# Patient Record
Sex: Male | Born: 1955 | Race: White | Hispanic: No | Marital: Single | Smoking: Former smoker
Health system: Southern US, Community
[De-identification: ages and names within clinical notes are randomized; demographics above are authoritative.]

## PROBLEM LIST (undated history)

## (undated) DIAGNOSIS — R519 Headache, unspecified: Secondary | ICD-10-CM

## (undated) DIAGNOSIS — I10 Essential (primary) hypertension: Secondary | ICD-10-CM

## (undated) DIAGNOSIS — R51 Headache: Secondary | ICD-10-CM

## (undated) DIAGNOSIS — F419 Anxiety disorder, unspecified: Secondary | ICD-10-CM

## (undated) DIAGNOSIS — E66812 Obesity, class 2: Secondary | ICD-10-CM

## (undated) DIAGNOSIS — G894 Chronic pain syndrome: Secondary | ICD-10-CM

## (undated) DIAGNOSIS — D126 Benign neoplasm of colon, unspecified: Secondary | ICD-10-CM

## (undated) DIAGNOSIS — S2239XA Fracture of one rib, unspecified side, initial encounter for closed fracture: Secondary | ICD-10-CM

## (undated) DIAGNOSIS — E785 Hyperlipidemia, unspecified: Secondary | ICD-10-CM

## (undated) DIAGNOSIS — Z720 Tobacco use: Secondary | ICD-10-CM

## (undated) DIAGNOSIS — F32A Depression, unspecified: Secondary | ICD-10-CM

## (undated) DIAGNOSIS — E669 Obesity, unspecified: Secondary | ICD-10-CM

## (undated) DIAGNOSIS — M1990 Unspecified osteoarthritis, unspecified site: Secondary | ICD-10-CM

## (undated) DIAGNOSIS — Z9861 Coronary angioplasty status: Secondary | ICD-10-CM

## (undated) DIAGNOSIS — K219 Gastro-esophageal reflux disease without esophagitis: Secondary | ICD-10-CM

## (undated) DIAGNOSIS — G709 Myoneural disorder, unspecified: Secondary | ICD-10-CM

## (undated) DIAGNOSIS — J189 Pneumonia, unspecified organism: Secondary | ICD-10-CM

## (undated) DIAGNOSIS — Z8669 Personal history of other diseases of the nervous system and sense organs: Secondary | ICD-10-CM

## (undated) DIAGNOSIS — J449 Chronic obstructive pulmonary disease, unspecified: Secondary | ICD-10-CM

## (undated) DIAGNOSIS — I2510 Atherosclerotic heart disease of native coronary artery without angina pectoris: Secondary | ICD-10-CM

## (undated) DIAGNOSIS — C3490 Malignant neoplasm of unspecified part of unspecified bronchus or lung: Secondary | ICD-10-CM

## (undated) DIAGNOSIS — R0600 Dyspnea, unspecified: Secondary | ICD-10-CM

## (undated) DIAGNOSIS — M797 Fibromyalgia: Secondary | ICD-10-CM

## (undated) DIAGNOSIS — F329 Major depressive disorder, single episode, unspecified: Secondary | ICD-10-CM

## (undated) HISTORY — PX: CARDIAC CATHETERIZATION: SHX172

## (undated) HISTORY — PX: APPENDECTOMY: SHX54

## (undated) HISTORY — PX: INGUINAL HERNIA REPAIR: SUR1180

## (undated) HISTORY — DX: Coronary angioplasty status: Z98.61

## (undated) HISTORY — DX: Fracture of one rib, unspecified side, initial encounter for closed fracture: S22.39XA

## (undated) HISTORY — DX: Major depressive disorder, single episode, unspecified: F32.9

## (undated) HISTORY — DX: Personal history of other diseases of the nervous system and sense organs: Z86.69

## (undated) HISTORY — DX: Fibromyalgia: M79.7

## (undated) HISTORY — DX: Chronic pain syndrome: G89.4

## (undated) HISTORY — DX: Obesity, unspecified: E66.9

## (undated) HISTORY — DX: Malignant neoplasm of unspecified part of unspecified bronchus or lung: C34.90

## (undated) HISTORY — DX: Obesity, class 2: E66.812

## (undated) HISTORY — PX: NM MYOVIEW LTD: HXRAD82

## (undated) HISTORY — DX: Hyperlipidemia, unspecified: E78.5

## (undated) HISTORY — PX: CORONARY ANGIOPLASTY WITH STENT PLACEMENT: SHX49

## (undated) HISTORY — DX: Essential (primary) hypertension: I10

## (undated) HISTORY — PX: MOLE REMOVAL: SHX2046

## (undated) HISTORY — PX: COLONOSCOPY: SHX174

## (undated) HISTORY — DX: Depression, unspecified: F32.A

## (undated) HISTORY — PX: HERNIA REPAIR: SHX51

## (undated) HISTORY — DX: Tobacco use: Z72.0

## (undated) HISTORY — DX: Atherosclerotic heart disease of native coronary artery without angina pectoris: I25.10

## (undated) HISTORY — DX: Benign neoplasm of colon, unspecified: D12.6

## (undated) HISTORY — DX: Anxiety disorder, unspecified: F41.9

---

## 1977-03-07 HISTORY — PX: EXPLORATORY LAPAROTOMY: SUR591

## 1997-12-25 ENCOUNTER — Encounter: Payer: Self-pay | Admitting: Endocrinology

## 1997-12-25 ENCOUNTER — Ambulatory Visit (HOSPITAL_COMMUNITY): Admission: RE | Admit: 1997-12-25 | Discharge: 1997-12-25 | Payer: Self-pay | Admitting: Endocrinology

## 1998-01-05 ENCOUNTER — Ambulatory Visit (HOSPITAL_COMMUNITY): Admission: RE | Admit: 1998-01-05 | Discharge: 1998-01-05 | Payer: Self-pay | Admitting: Otolaryngology

## 1998-02-27 ENCOUNTER — Ambulatory Visit (HOSPITAL_COMMUNITY): Admission: RE | Admit: 1998-02-27 | Discharge: 1998-02-27 | Payer: Self-pay | Admitting: *Deleted

## 2000-06-05 ENCOUNTER — Encounter: Admission: RE | Admit: 2000-06-05 | Discharge: 2000-07-11 | Payer: Self-pay

## 2000-06-06 ENCOUNTER — Encounter: Admission: RE | Admit: 2000-06-06 | Discharge: 2000-06-06 | Payer: Self-pay

## 2001-03-13 ENCOUNTER — Encounter: Admission: RE | Admit: 2001-03-13 | Discharge: 2001-03-13 | Payer: Self-pay | Admitting: Endocrinology

## 2001-03-13 ENCOUNTER — Encounter: Payer: Self-pay | Admitting: Endocrinology

## 2003-06-03 ENCOUNTER — Encounter: Admission: RE | Admit: 2003-06-03 | Discharge: 2003-06-03 | Payer: Self-pay | Admitting: Endocrinology

## 2003-06-03 IMAGING — CT CT HEAD W/O CM
1 series · 15 of 28 positions shown, 19 images · non-contrast
Comparison: none

CLINICAL DATA: Headaches. 
 CT HEAD SCAN WITHOUT CONTRAST MEDIA 
 Transaxial cuts were obtained from the skull base to the vertex.  There is mucosal thickening involving nasal and ethmoidal complexes and anterior aspect of the sphenoid sinus.  Hypoplastic frontal sinuses.  Somewhat of a convex soft tissue nodular-like appearance raising the question of sinonasal polyposis.  Probable mucosal thickening of portions of nasal cavity mainly anteriorly.  Attenuation of some of the ethmoid sinus septae.  Some of the septae may be absent as well.  Also, possible destruction or previous resection of nasal turbinates.  Does the patient have a history of chronic sinonasal inflammatory disease such as sinonasal polyposis or other chronic inflammatory process?  Also, is there a history of previous sinonasal surgery?  No acute intracranial findings.  No mass or hydrocephalus.  Mild generalized atrophic changes of the brain.  
 IMPRESSION
 On this noncontrast CT study, there is no evidence of hydrocephalus or mass effect.  See comments concerning sinonasal findings, more likely inflammatory in nature.  Also, query post-surgical changes.  If further imaging workup is desired, consider MRI of the brain and paranasal sinuses with and without IV contrast.

[Series 2: brain · axial · 0.49mm/px · z∈[+42,+174]mm · 15 of 28 slices shown, 19 images]
[im 2/28  brain]
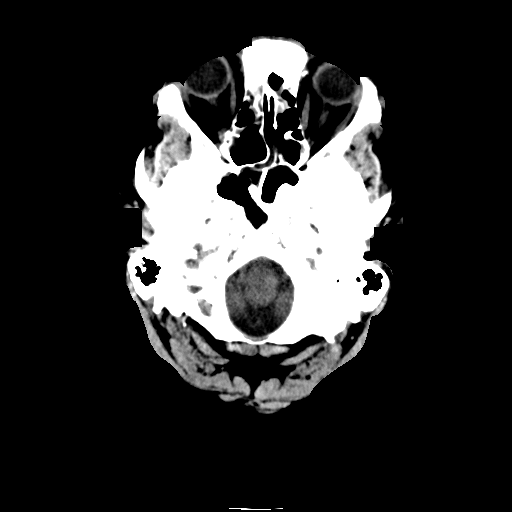
[im 2/28  bone]
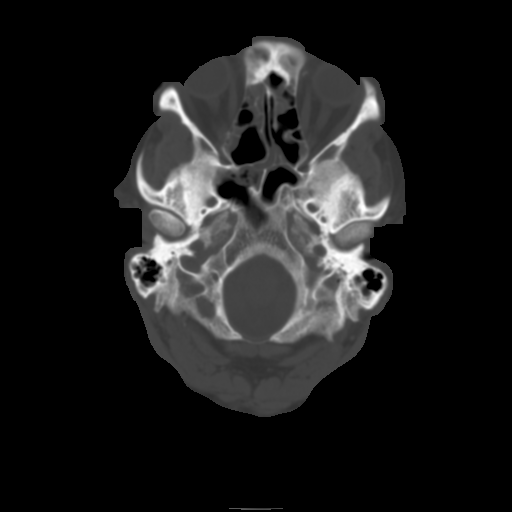
[im 4/28  brain]
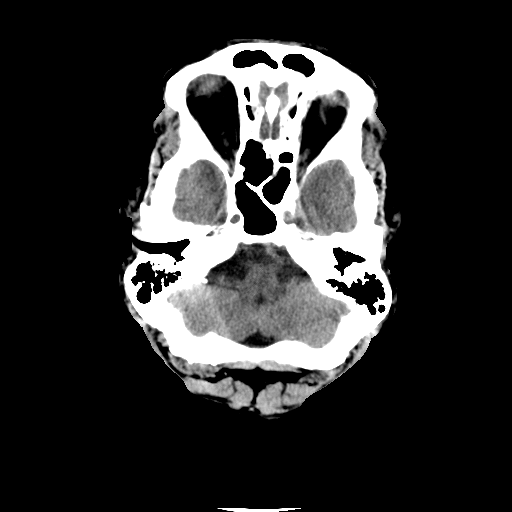
[im 6/28  brain]
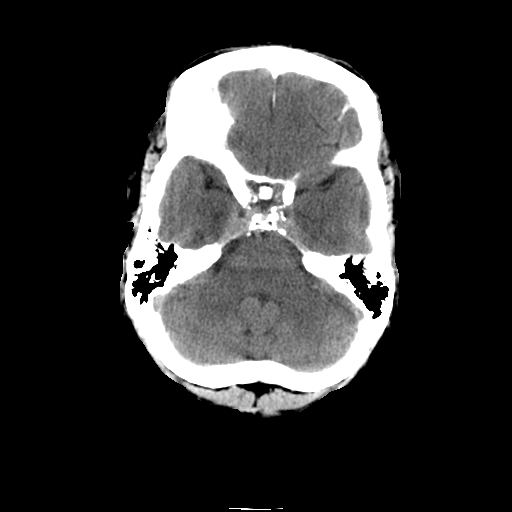
[im 8/28  brain]
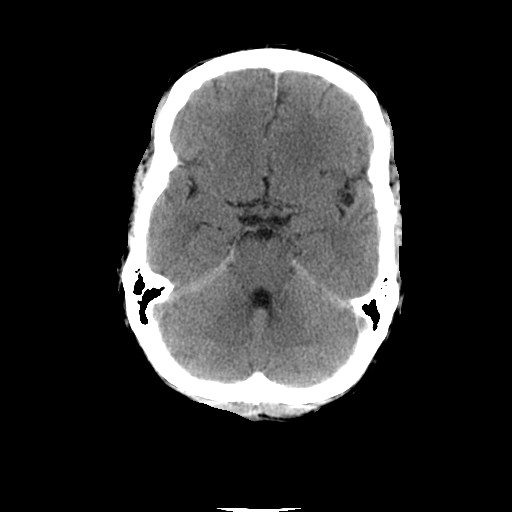
[im 9/28  brain]
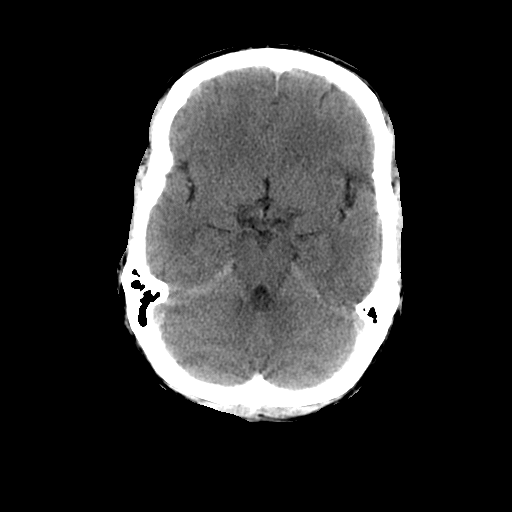
[im 9/28  bone]
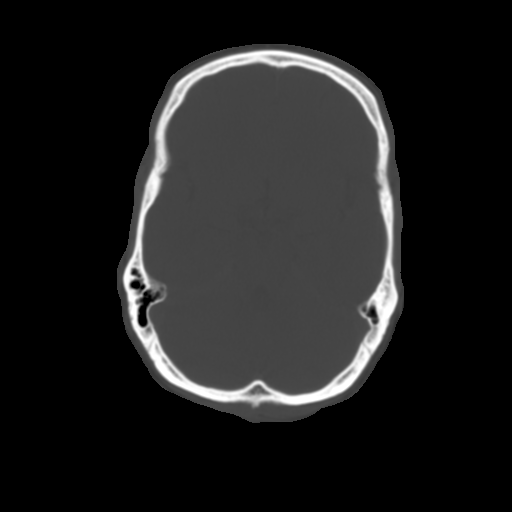
[im 11/28  brain]
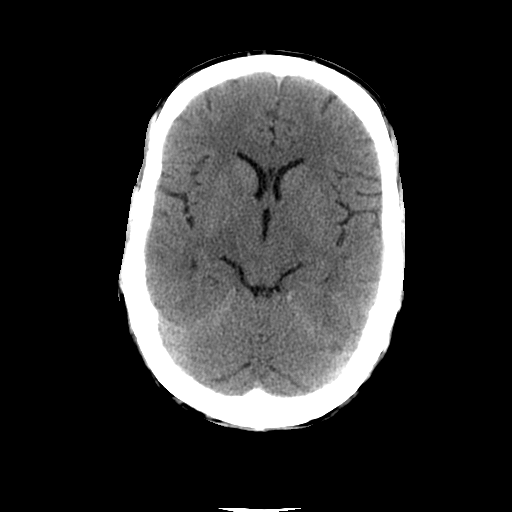
[im 13/28  brain]
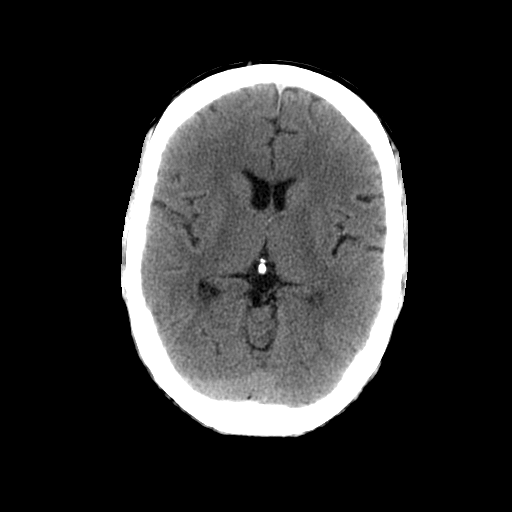
[im 15/28  brain]
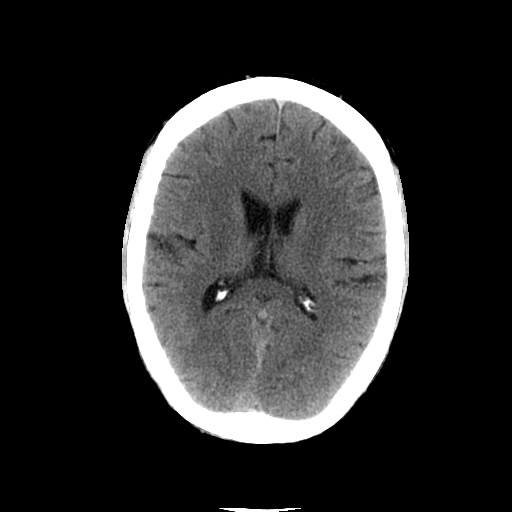
[im 16/28  brain]
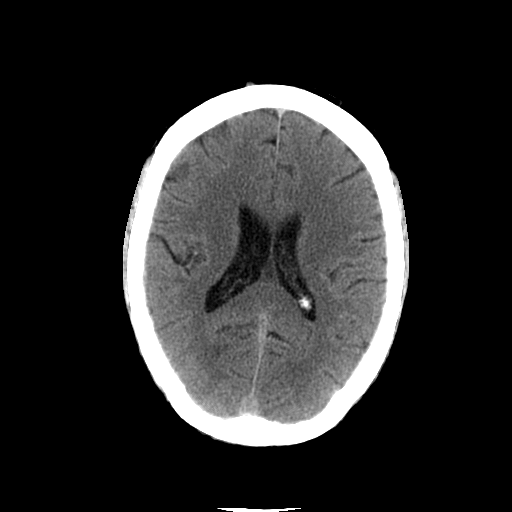
[im 16/28  bone]
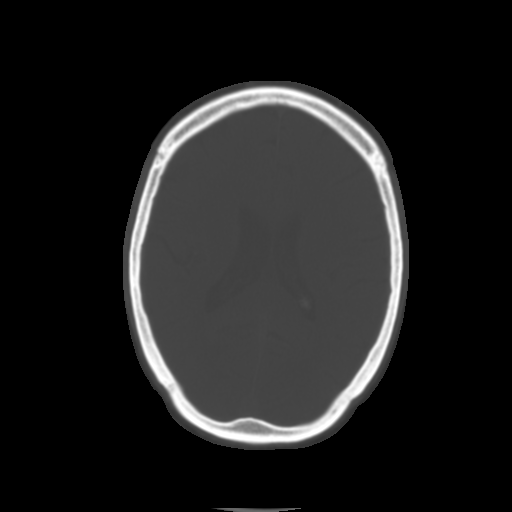
[im 18/28  brain]
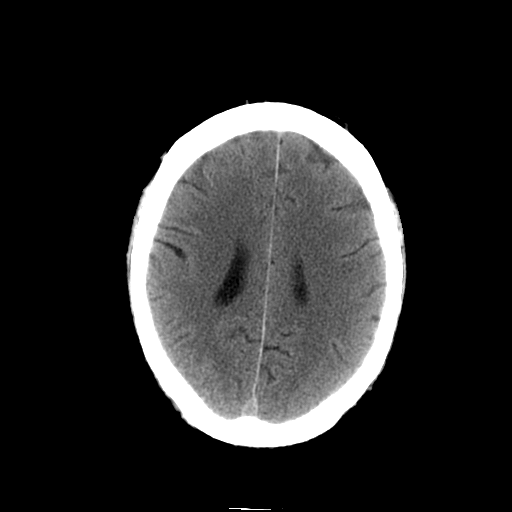
[im 20/28  brain]
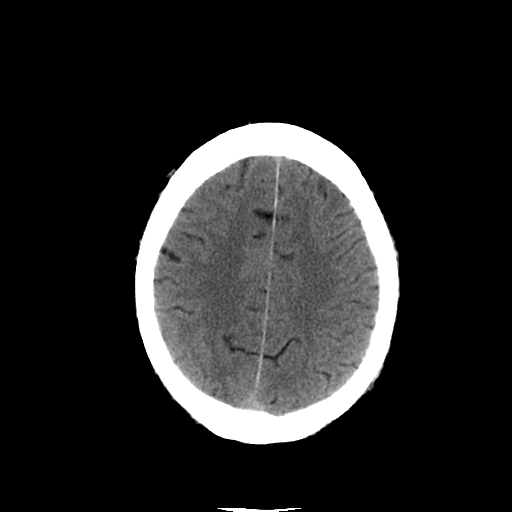
[im 21/28  brain]
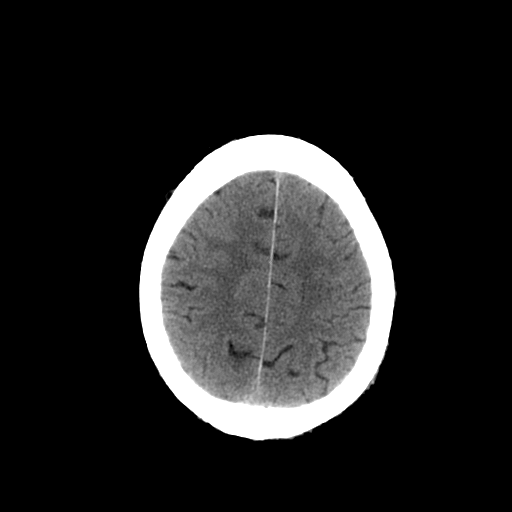
[im 23/28  brain]
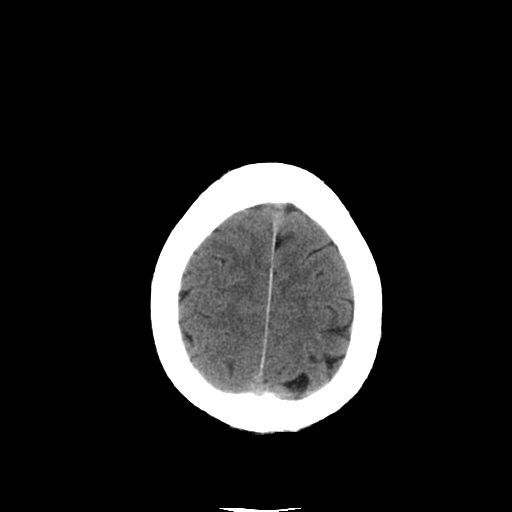
[im 23/28  bone]
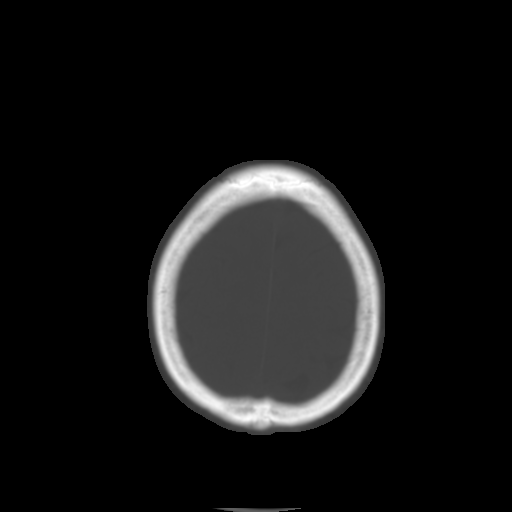
[im 25/28  brain]
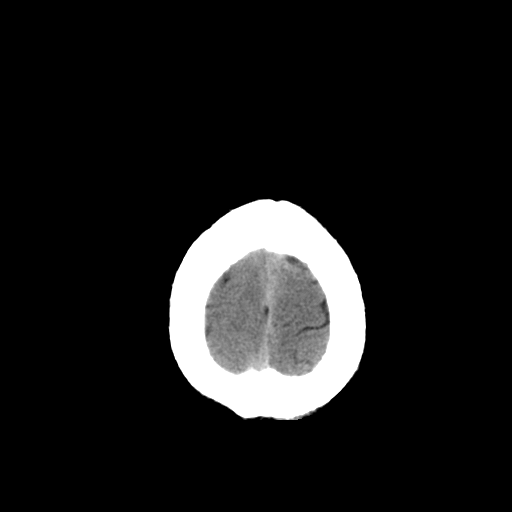
[im 27/28  brain]
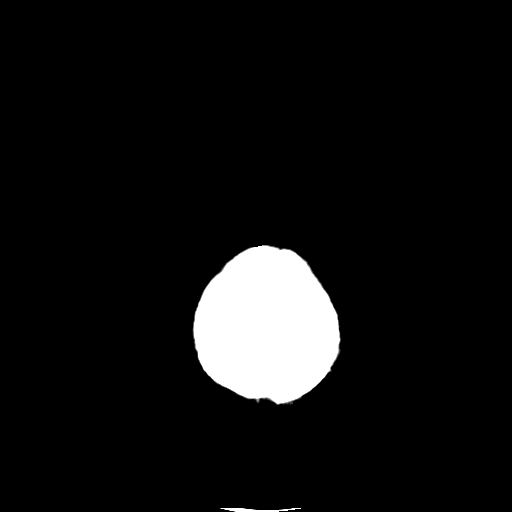

[15 of 28 positions shown; findings below may reference images not displayed]

## 2004-03-07 DIAGNOSIS — Z9861 Coronary angioplasty status: Secondary | ICD-10-CM

## 2004-03-07 DIAGNOSIS — I2510 Atherosclerotic heart disease of native coronary artery without angina pectoris: Secondary | ICD-10-CM

## 2004-03-07 HISTORY — DX: Atherosclerotic heart disease of native coronary artery without angina pectoris: I25.10

## 2004-03-07 HISTORY — DX: Coronary angioplasty status: Z98.61

## 2004-03-30 ENCOUNTER — Inpatient Hospital Stay (HOSPITAL_COMMUNITY): Admission: EM | Admit: 2004-03-30 | Discharge: 2004-04-03 | Payer: Self-pay | Admitting: *Deleted

## 2004-03-30 IMAGING — CR DG CHEST 1V PORT
1 series · 1 of 1 positions shown · non-contrast
Comparison: None.

CLINICAL DATA: Chest pain and shortness of breath.

[view not recorded]
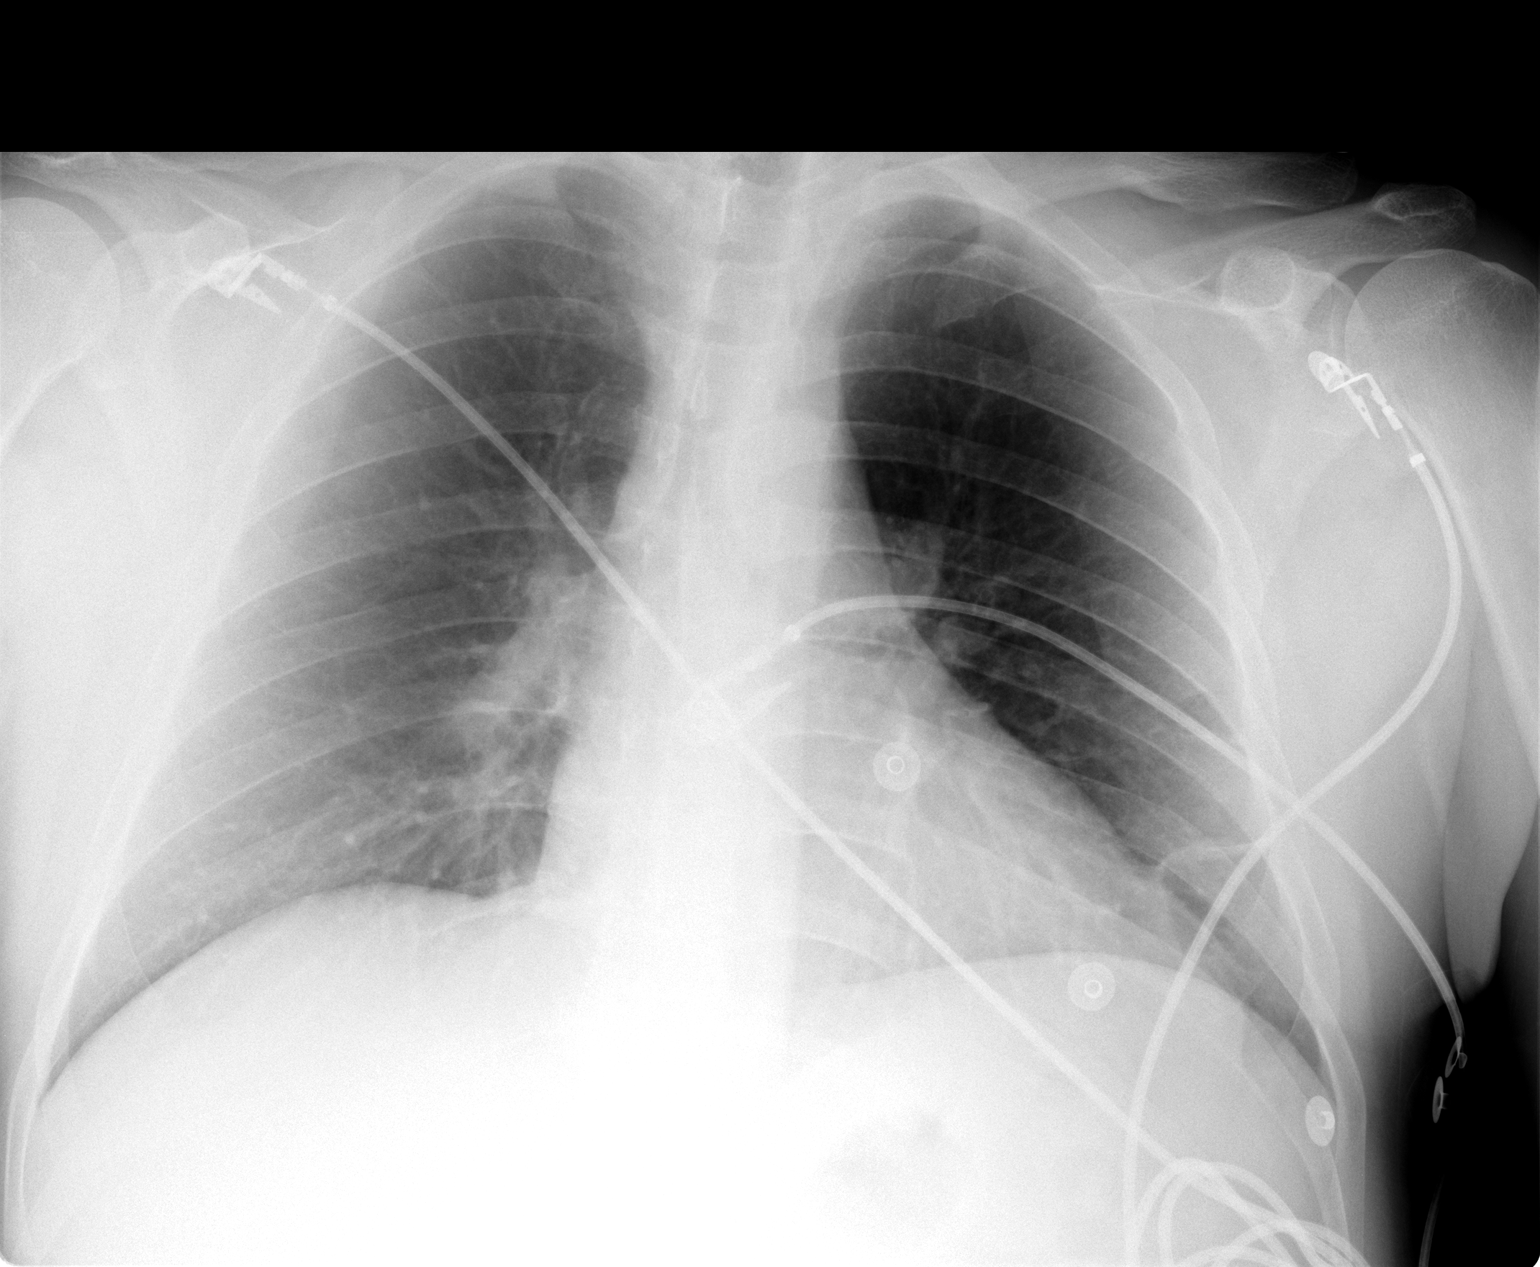

[1 of 1 positions shown; findings below may reference images not displayed]

CHEST PORTABLE ONE VIEW:
 Lungs are clear.  Heart size is normal.  No focal bony abnormality.
IMPRESSION: No acute disease.

## 2004-04-19 ENCOUNTER — Encounter (HOSPITAL_COMMUNITY): Admission: RE | Admit: 2004-04-19 | Discharge: 2004-07-18 | Payer: Self-pay | Admitting: Cardiology

## 2004-05-05 ENCOUNTER — Ambulatory Visit (HOSPITAL_COMMUNITY): Admission: AD | Admit: 2004-05-05 | Discharge: 2004-05-06 | Payer: Self-pay | Admitting: Cardiology

## 2004-05-05 IMAGING — CR DG CHEST 2V
2 series · 2 of 2 positions shown · non-contrast
Comparison: none

CLINICAL DATA: Chest pain

Chest 2 view:
Comparison [DATE]. There is a new patchy lingular infiltrate. Heart size
remains normal. No effusion. Visualized bones unremarkable.

[view not recorded (1 of 2)]
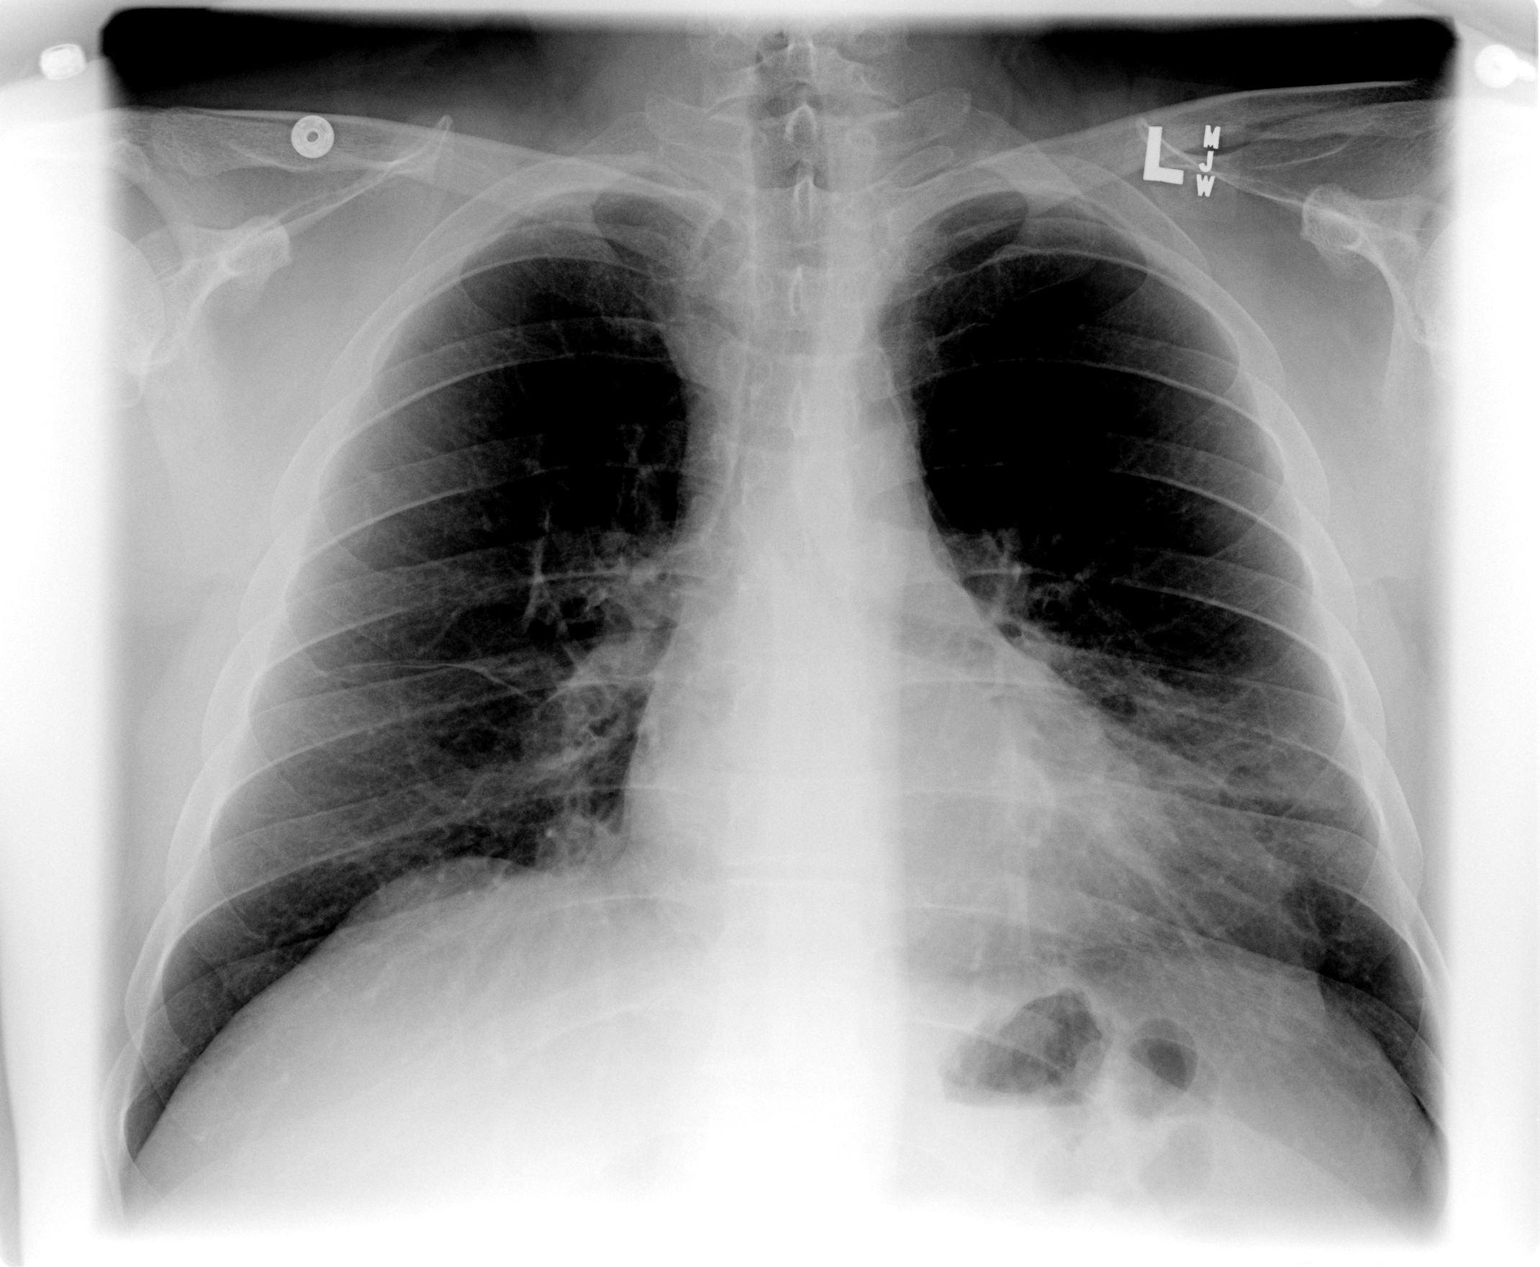

[view not recorded (2 of 2)]
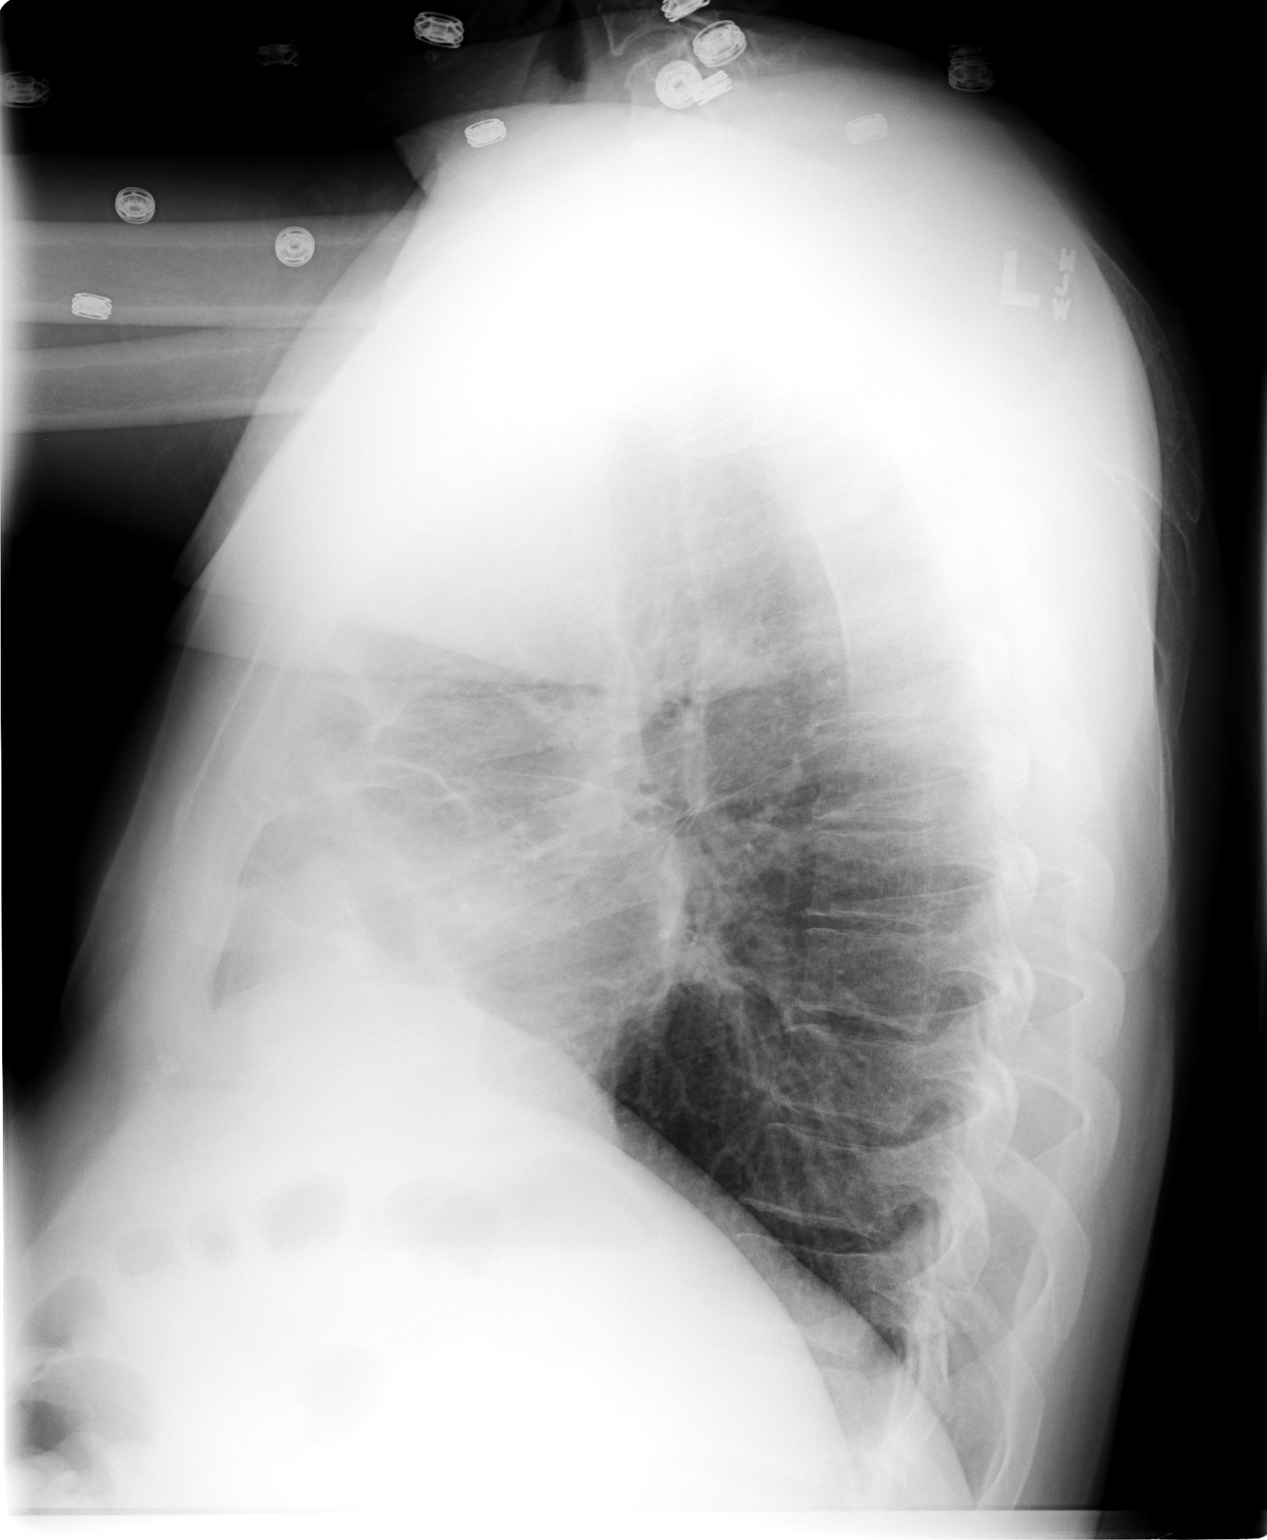

[2 of 2 positions shown; findings below may reference images not displayed]

IMPRESSION: 1. Patchy lingular infiltrate, new since previous exam. Recommend followup to
confirm appropriate resolution.

## 2004-05-06 IMAGING — CR DG CHEST 2V
2 series · 2 of 2 positions shown · non-contrast
Comparison: [DATE].

CLINICAL DATA: Follow-up lingular infiltrate.  Chest pain.
 CHEST - 2 VIEW:

[view not recorded (1 of 2)]
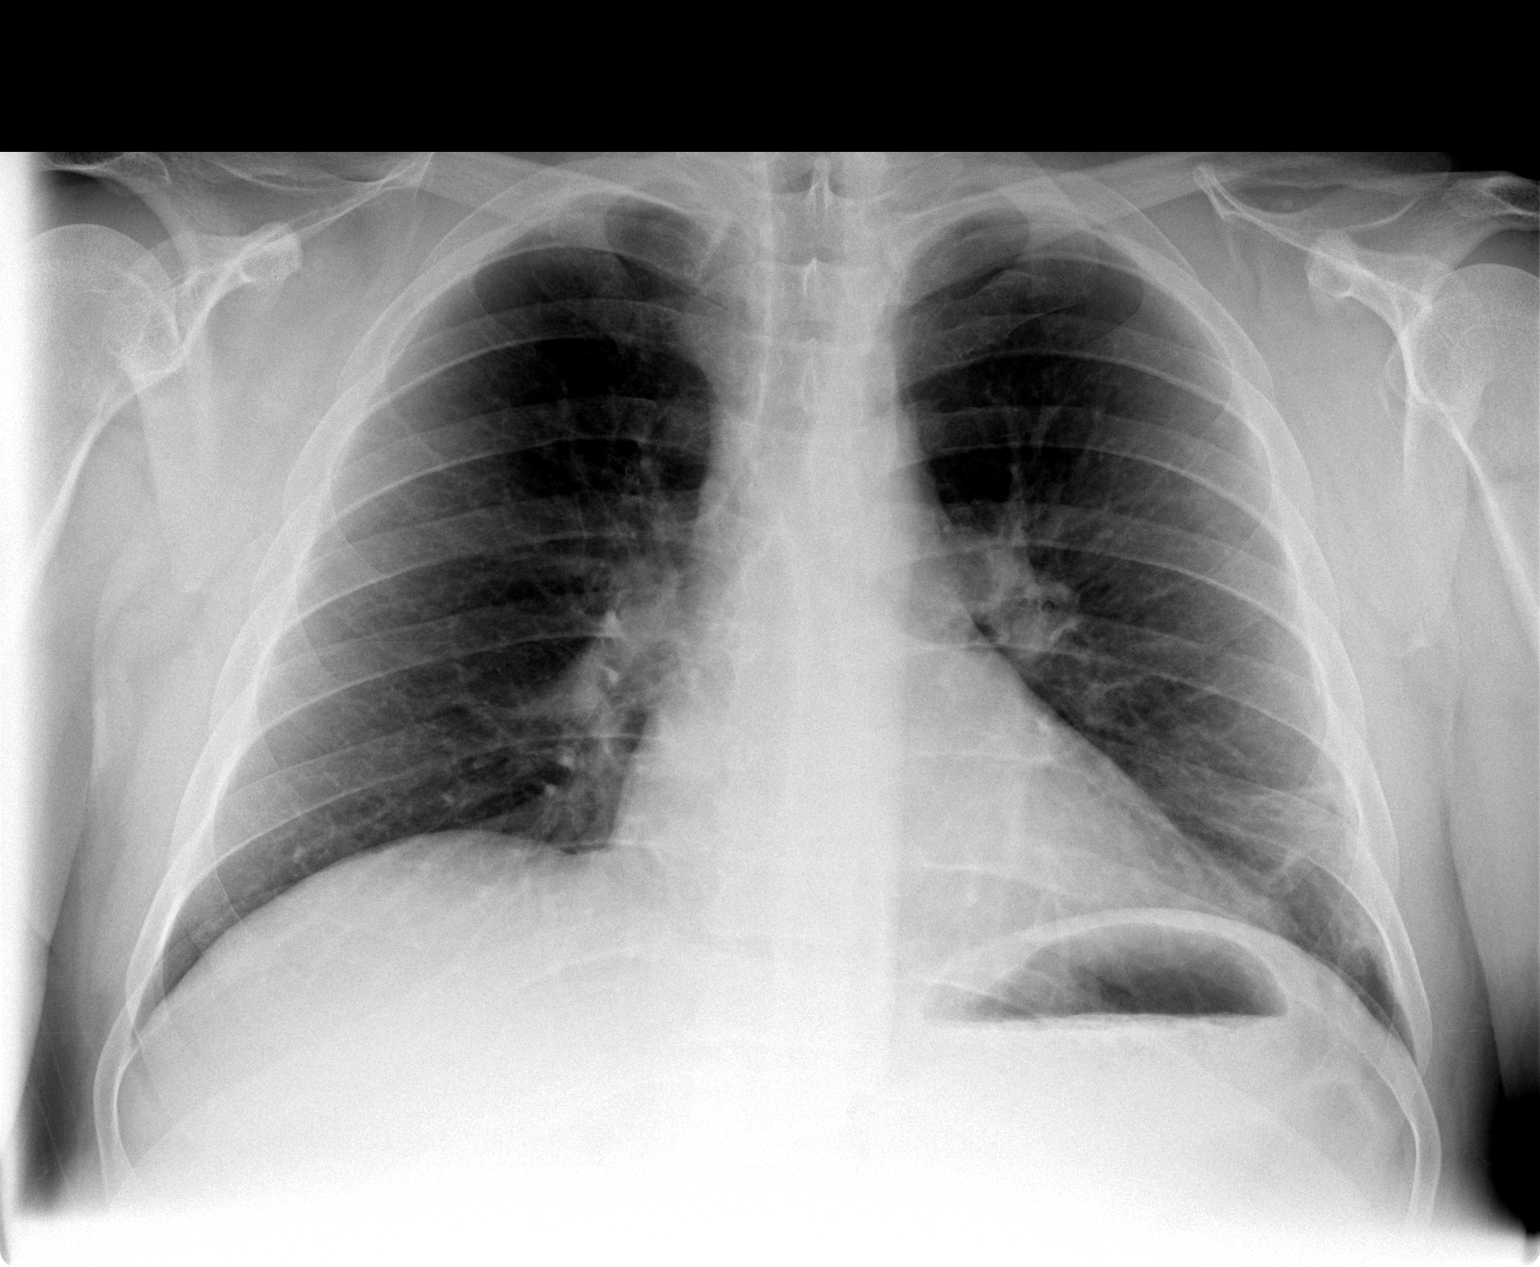

[view not recorded (2 of 2)]
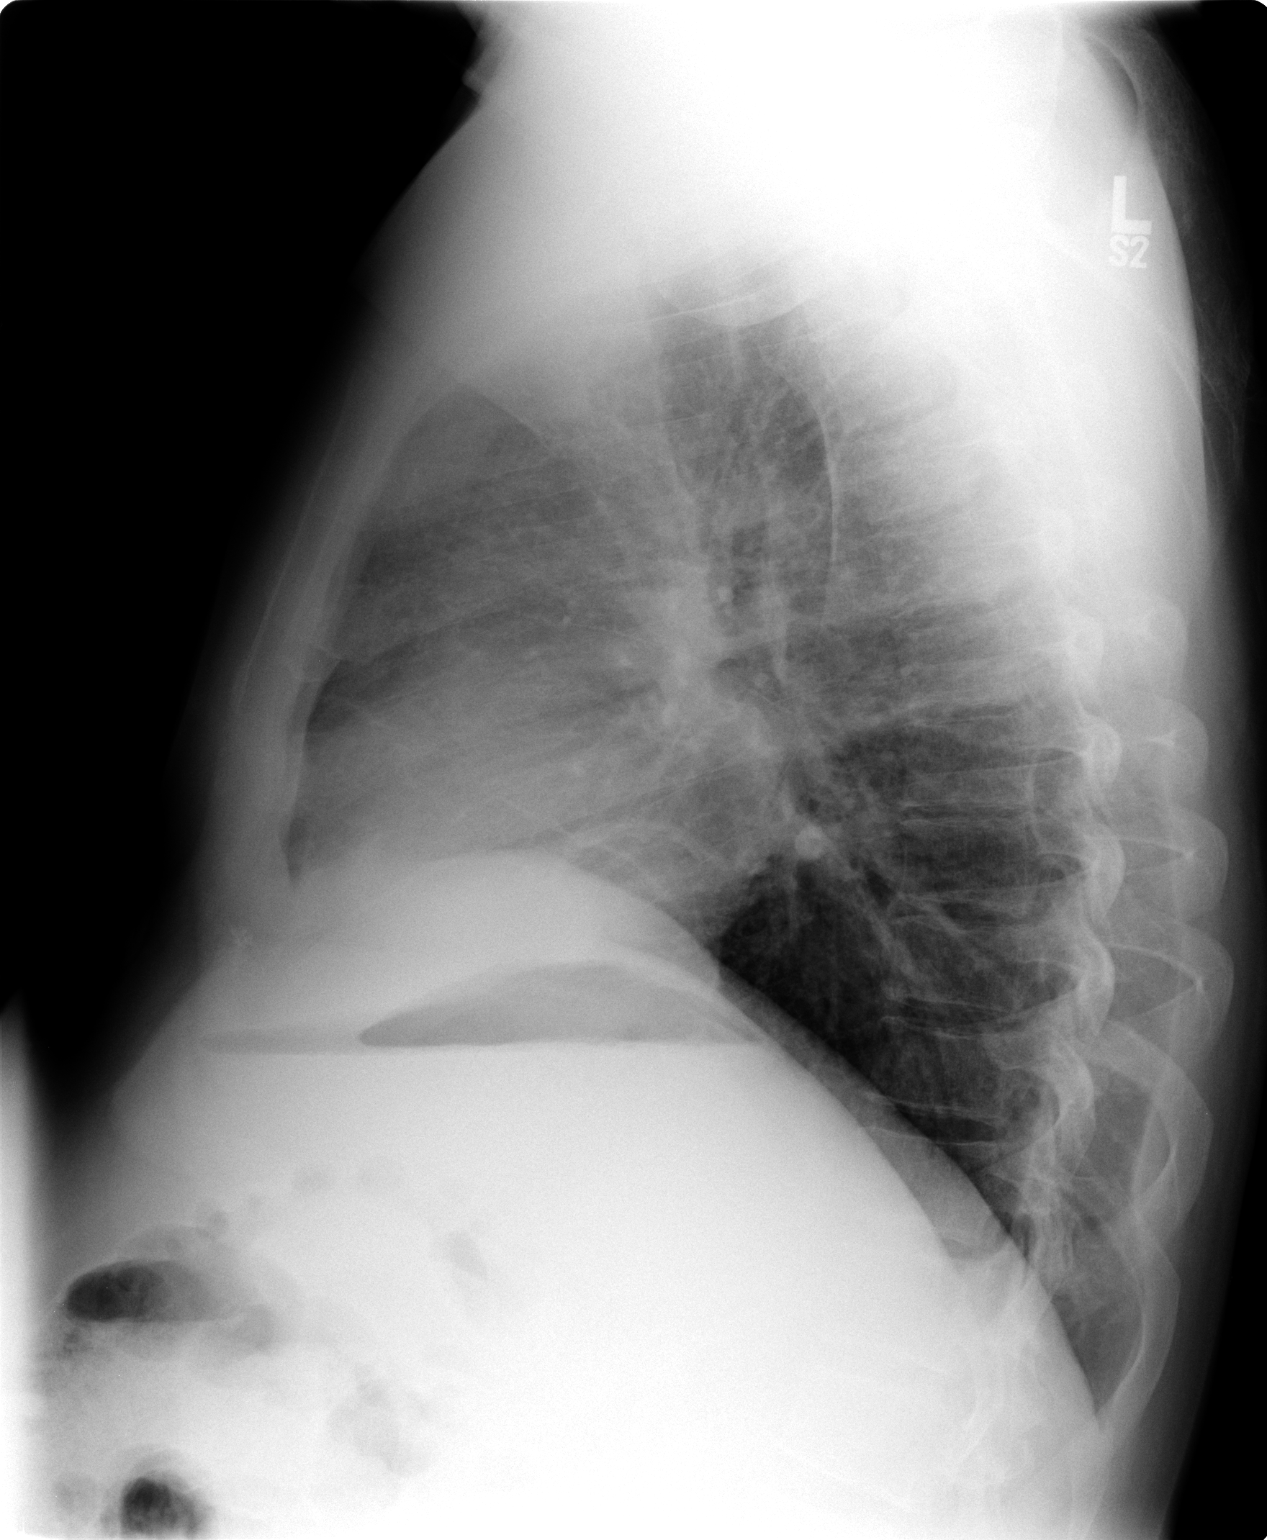

[2 of 2 positions shown; findings below may reference images not displayed]

Near complete resolution of lingular opacity is seen since the previous study.  There is mild residual linear opacity which may be due to residual atelectasis or scarring. 
 The right lung remains clear.  There is no evidence of pleural effusion.  The heart size and mediastinal contours are normal.
IMPRESSION: Near complete resolution of lingular atelectasis or infiltrate since the prior study.

## 2004-07-19 ENCOUNTER — Encounter (HOSPITAL_COMMUNITY): Admission: RE | Admit: 2004-07-19 | Discharge: 2004-10-17 | Payer: Self-pay | Admitting: Cardiology

## 2005-12-29 ENCOUNTER — Ambulatory Visit: Payer: Self-pay | Admitting: Family Medicine

## 2006-01-02 ENCOUNTER — Ambulatory Visit: Payer: Self-pay | Admitting: *Deleted

## 2006-02-13 ENCOUNTER — Ambulatory Visit: Payer: Self-pay | Admitting: Family Medicine

## 2006-02-15 ENCOUNTER — Ambulatory Visit: Payer: Self-pay | Admitting: Cardiovascular Disease

## 2006-03-16 ENCOUNTER — Ambulatory Visit: Payer: Self-pay | Admitting: Cardiovascular Disease

## 2006-04-04 ENCOUNTER — Ambulatory Visit: Payer: Self-pay | Admitting: Family Medicine

## 2006-05-02 ENCOUNTER — Ambulatory Visit: Payer: Self-pay | Admitting: Family Medicine

## 2006-06-26 ENCOUNTER — Ambulatory Visit: Payer: Self-pay | Admitting: Family Medicine

## 2006-08-28 ENCOUNTER — Ambulatory Visit: Payer: Self-pay | Admitting: Cardiovascular Disease

## 2006-10-24 IMAGING — CR DG CHEST 1V PORT
1 series · 1 of 1 positions shown · non-contrast
Comparison: none

CLINICAL DATA: Difficulty breathing

Portable chest at [AF]:
Comparison [DATE]. At least 2 left-sided rib fractures are again evident. No
pneumothorax or definite effusion. Low lung volumes with bibasilar atelectasis.
Mild enlargement of cardiac silhouette, emphasized by low volumes and portable
technique.

[view not recorded]
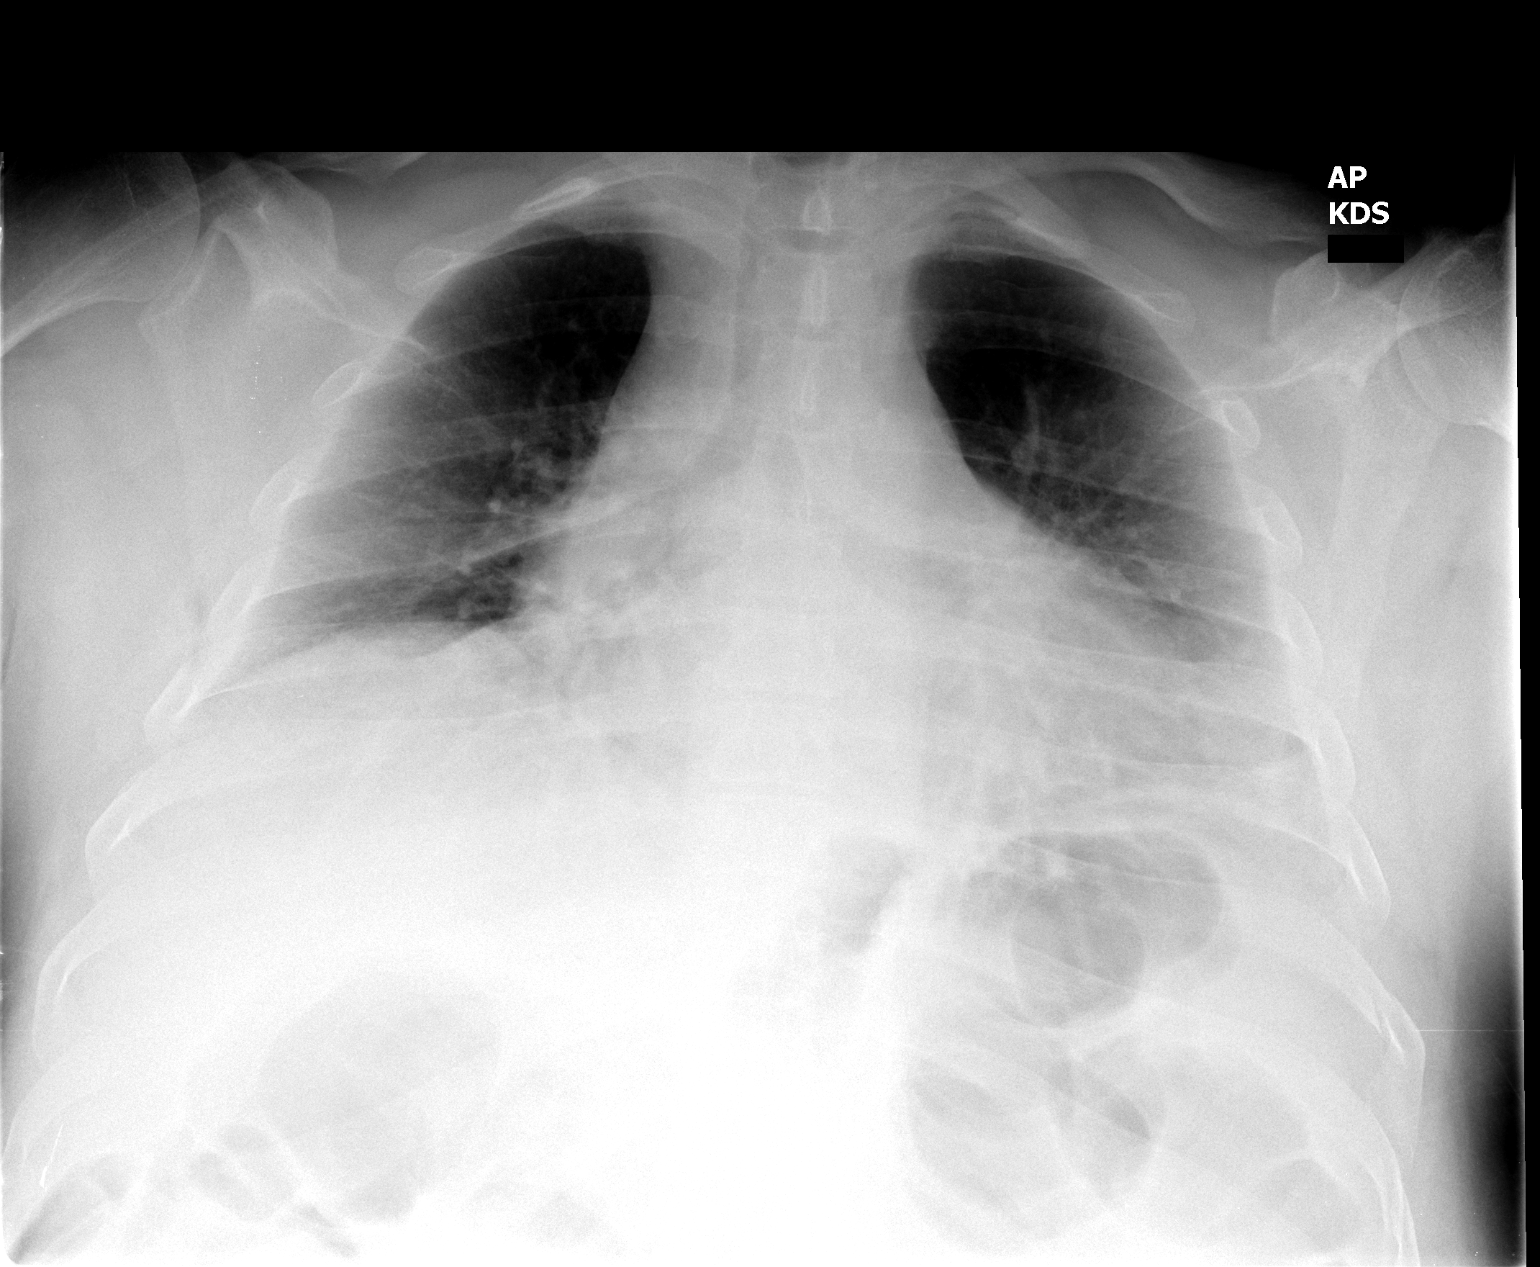

[1 of 1 positions shown; findings below may reference images not displayed]

IMPRESSION: 1. Low lung volumes with bibasilar atelectasis.
2. Left rib fractures with no definite pneumothorax.

## 2006-10-24 IMAGING — CT CT PELVIS W/O CM
2 of 5 series · 17 of 46 positions shown, 19 images · non-contrast
Comparison: none

CLINICAL DATA: Fell out of the bed

[Series 4: recon 3: abd pelvis · axial · 0.79mm/px · z∈[-517,-40]mm · 14 of 825 slices shown, 16 images]
[im 31/825  soft-tissue]
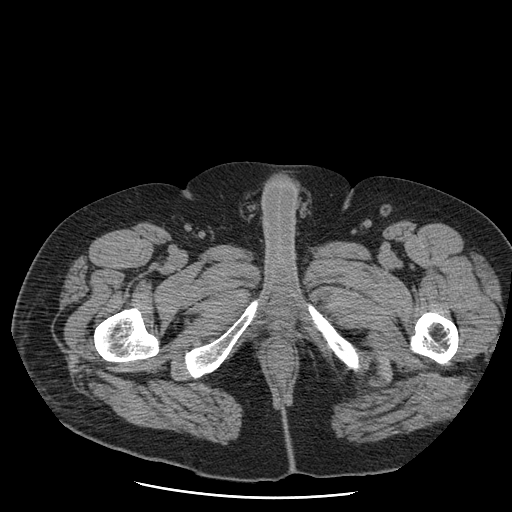
[im 31/825  bone]
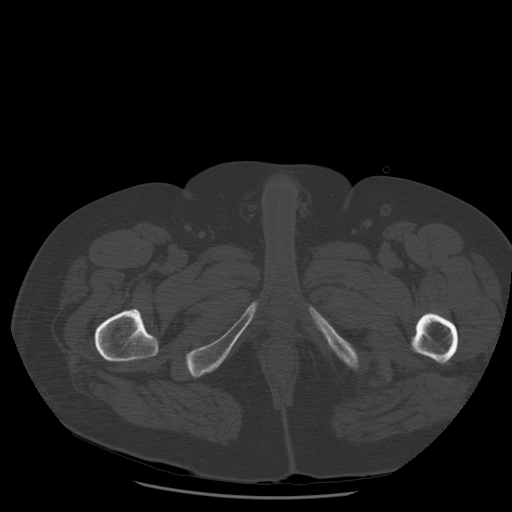
[im 92/825  soft-tissue]
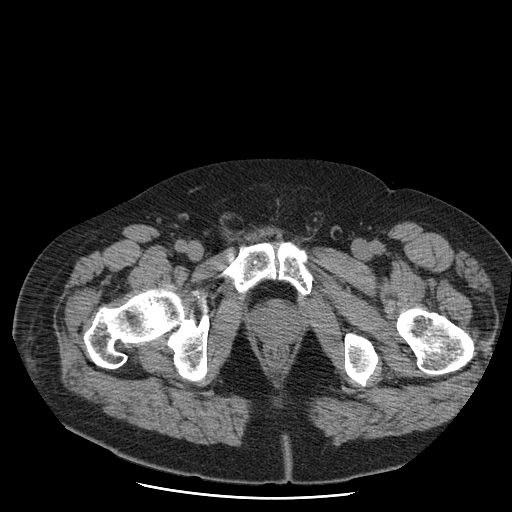
[im 153/825  soft-tissue]
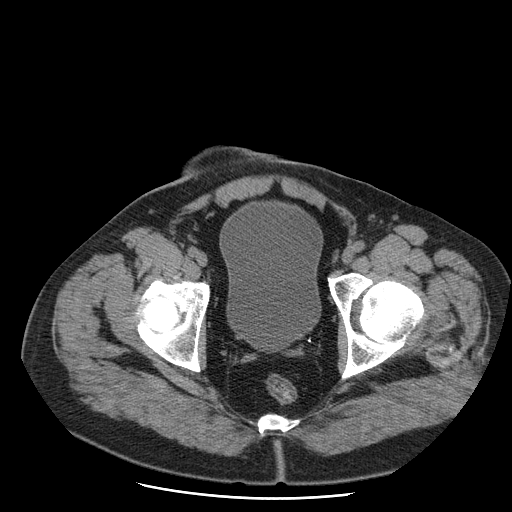
[im 214/825  soft-tissue]
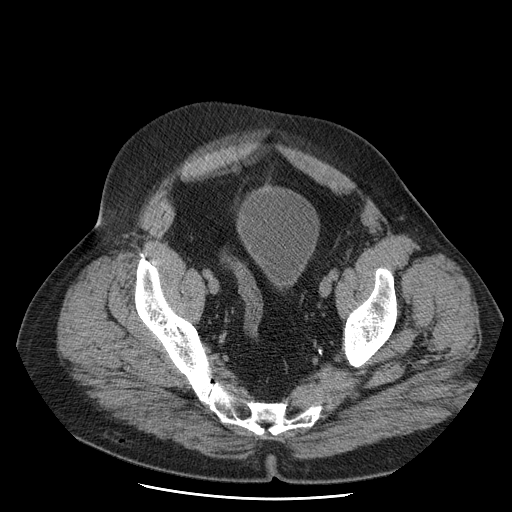
[im 275/825  soft-tissue]
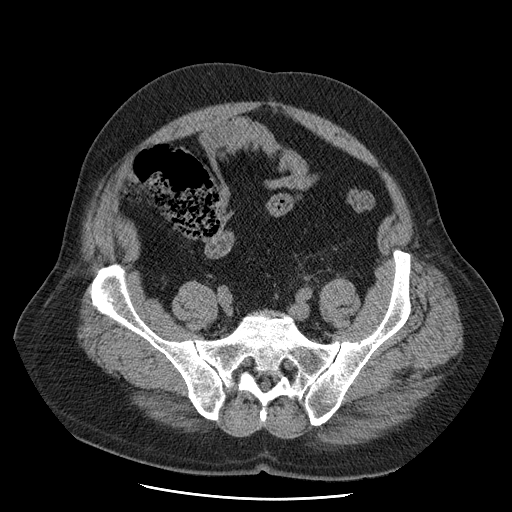
[im 336/825  soft-tissue]
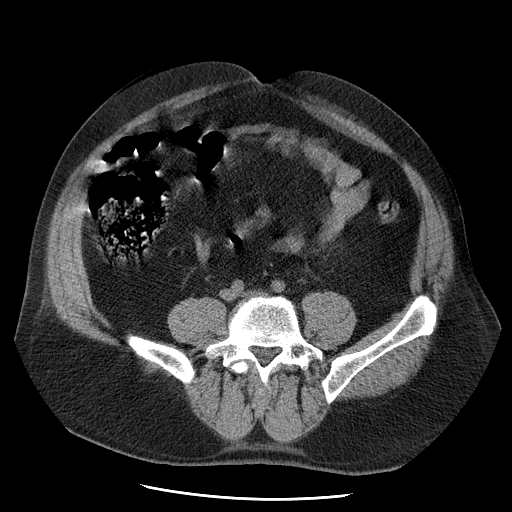
[im 397/825  soft-tissue]
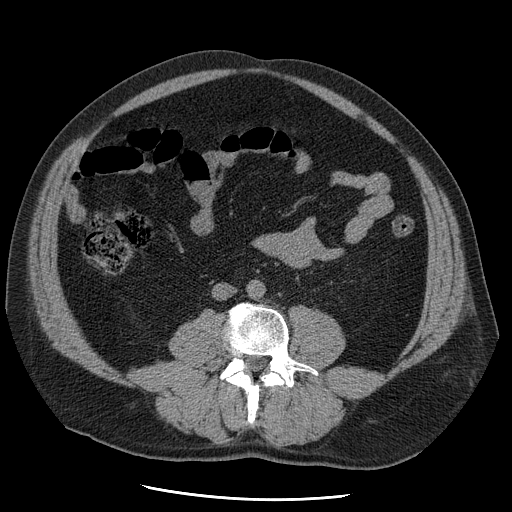
[im 428/825  soft-tissue]
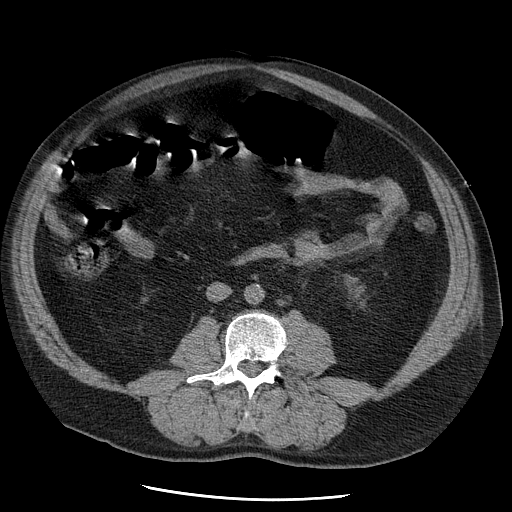
[im 489/825  soft-tissue]
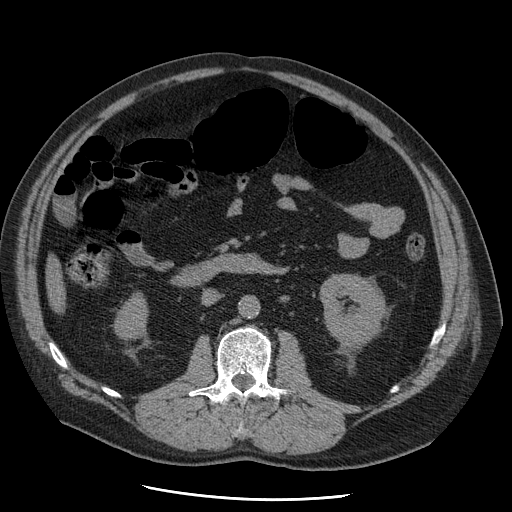
[im 489/825  bone]
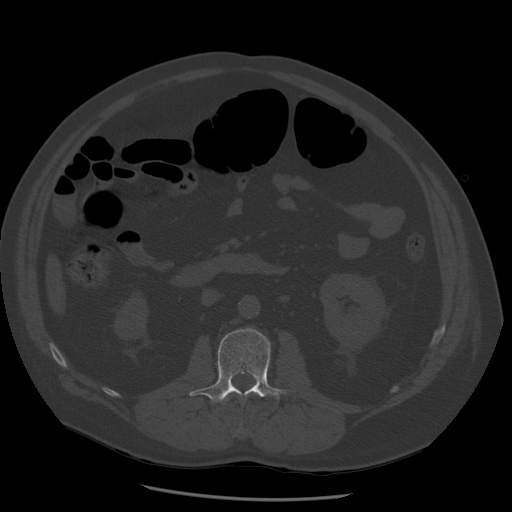
[im 550/825  soft-tissue]
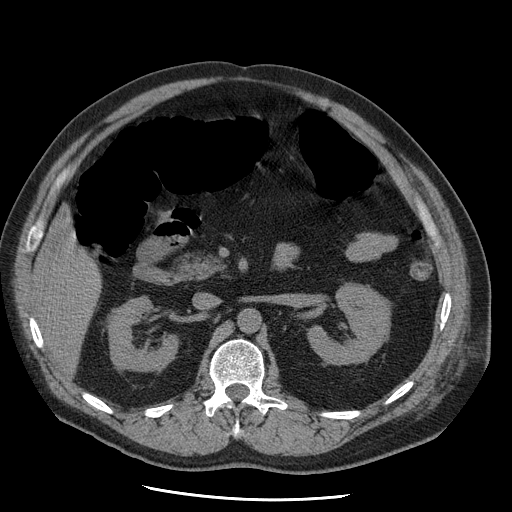
[im 611/825  soft-tissue]
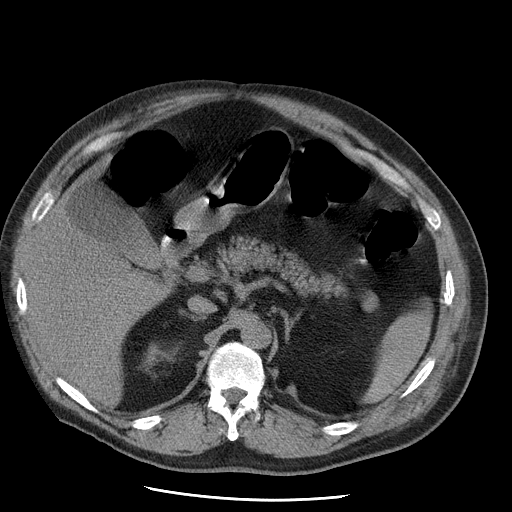
[im 672/825  soft-tissue]
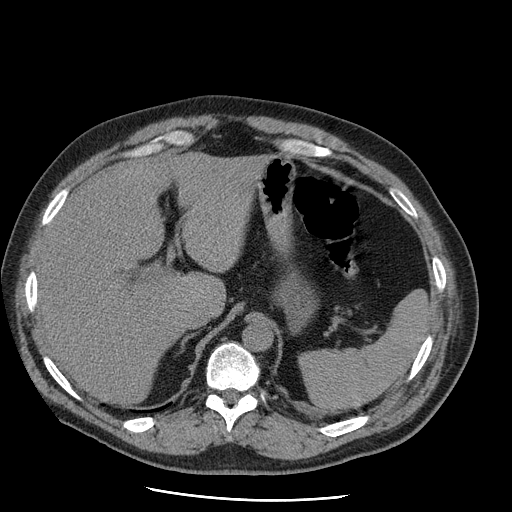
[im 733/825  soft-tissue]
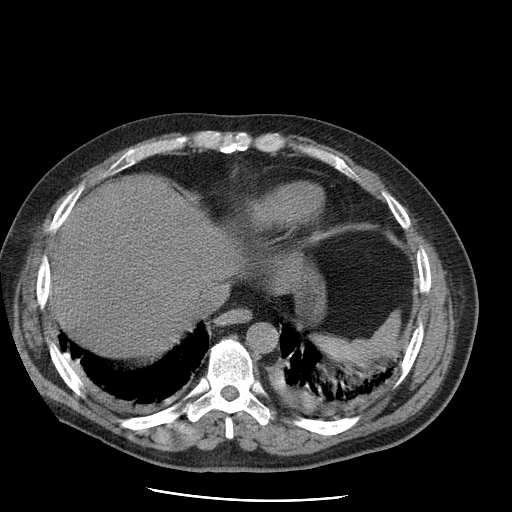
[im 794/825  soft-tissue]
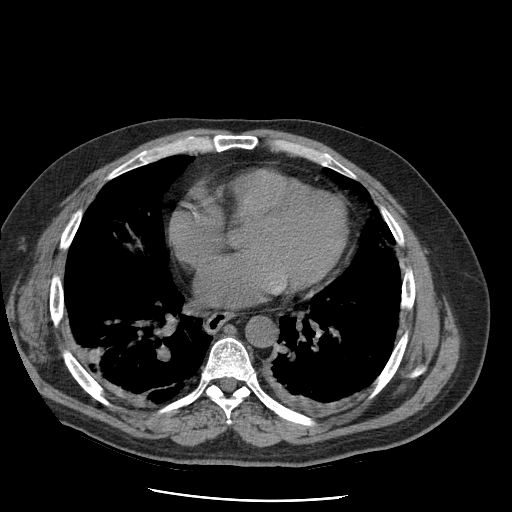

[Series 401: reformatted · coronal · 1.03mm/px · 3 of 164 slices shown]
[im 55/164  soft-tissue]
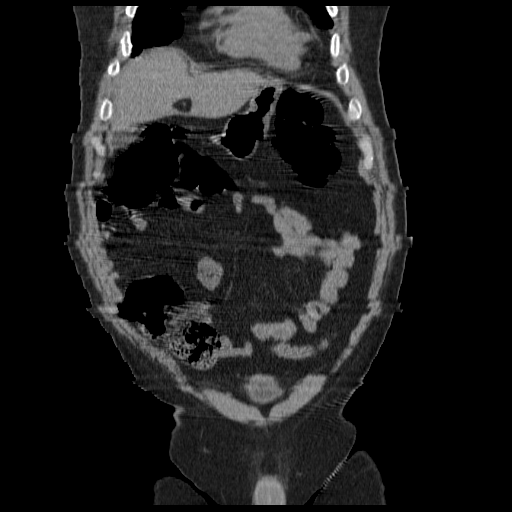
[im 73/164  soft-tissue]
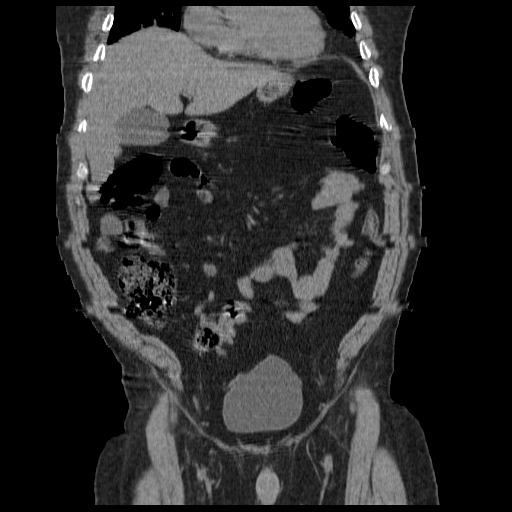
[im 91/164  soft-tissue]
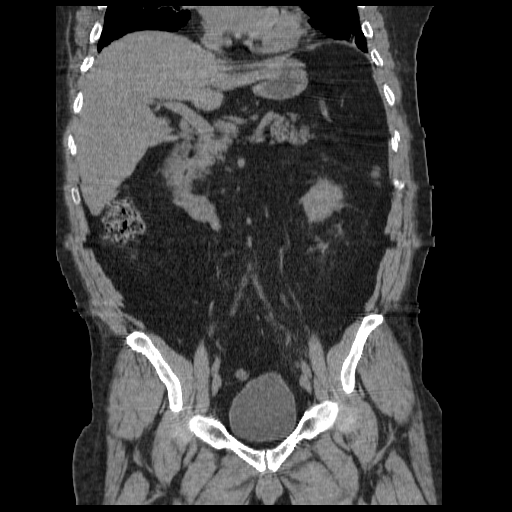

[17 of 46 positions shown; findings below may reference images not displayed]

CT abdomen without contrast:

No previous for comparison. Dependent atelectasis in both lung bases. Coarse
linear atelectasis or scarring. No pneumothorax is evident at the lung bases. No
effusion. Coronary calcifications. Unremarkable noncontrast evaluation of liver,
gallbladder, spleen and accessory splenule, pancreas, kidneys, adrenal glands,
small bowel. No free air. No ascites. Minimal calcification in the nondilated
abdominal aorta. No adenopathy localized. There is nondisplaced fracture through
the lateral aspect left 10th rib with some small adjacent metallic densities.
Nondisplaced fractures of left 8th and 9th ribs also noted.
IMPRESSION: 1. Nondisplaced fractures through the lateral aspect of left ribs 8-10 with
adjacent tiny metallic foreign bodies at the level of the 10th rib fracture.
2. Negative for acute intra-abdominal process.

CT pelvis without contrast:

Colon is decompressed, unremarkable. Urinary bladder physiologically distended.
No free fluid. No adenopathy.
IMPRESSION: 1. Unremarkable CT pelvis

## 2006-10-24 IMAGING — CR DG RIBS W/ CHEST 3+V*L*
4 series · 4 of 4 positions shown · non-contrast
Comparison: [DATE].

Exam: Chest, one view. Left ribs, 3 views.

HISTORY: Left rib pain.

[w chest pa]
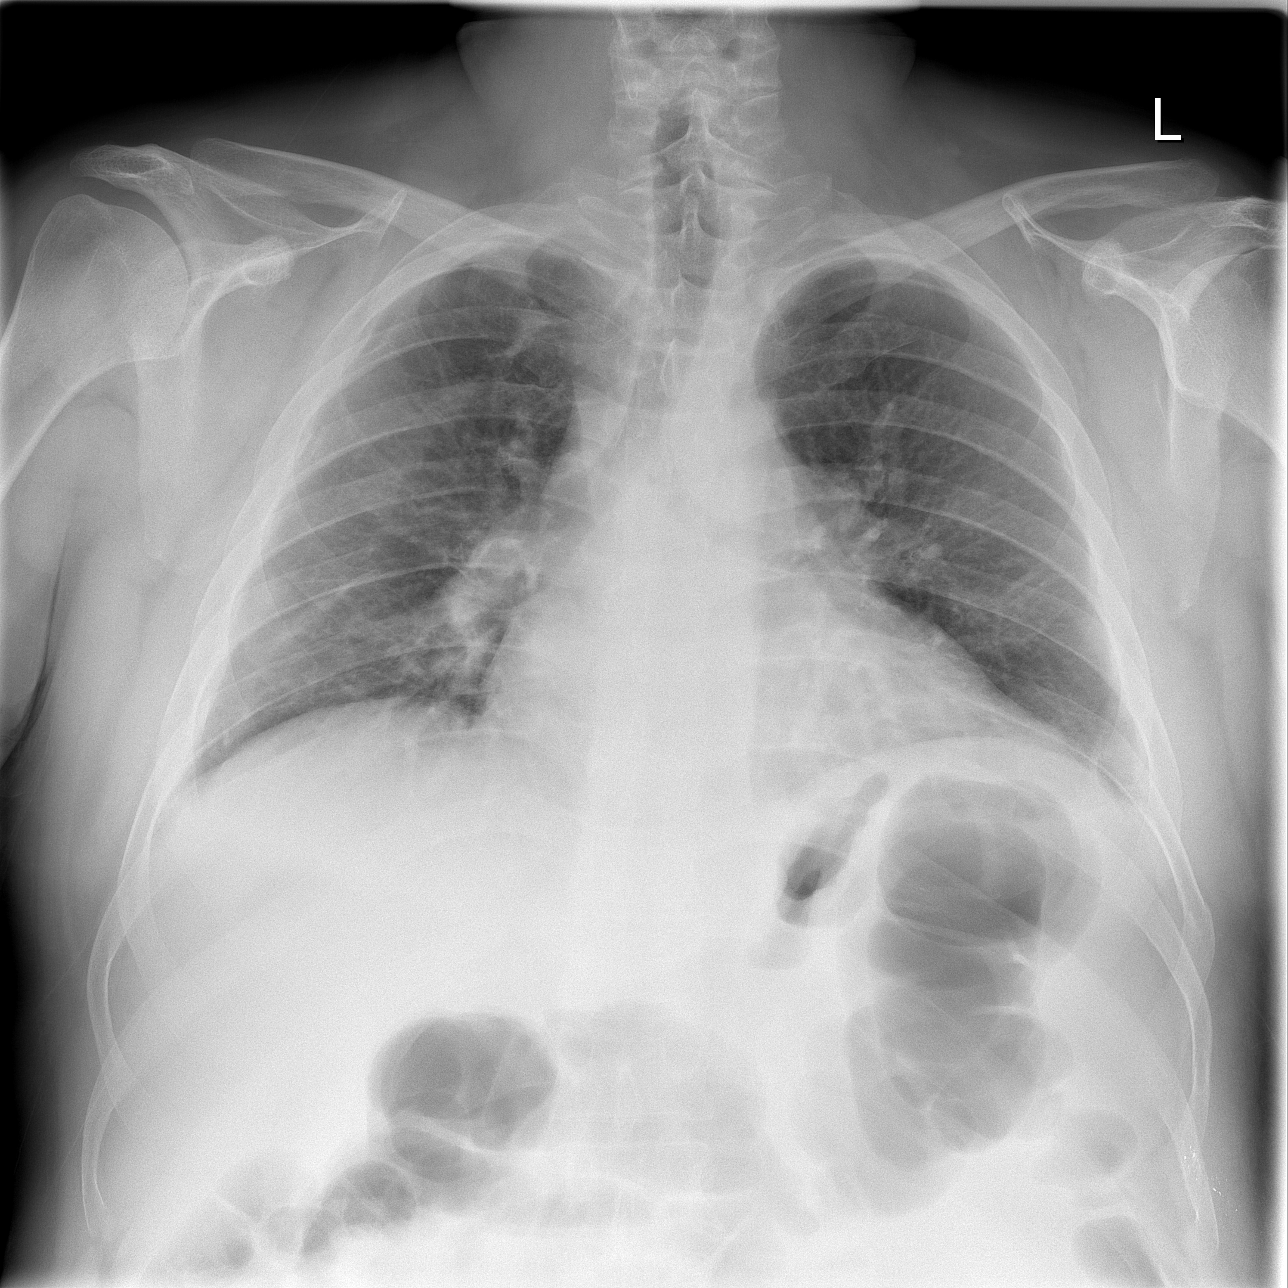

[w ribs ap/pa upper left]
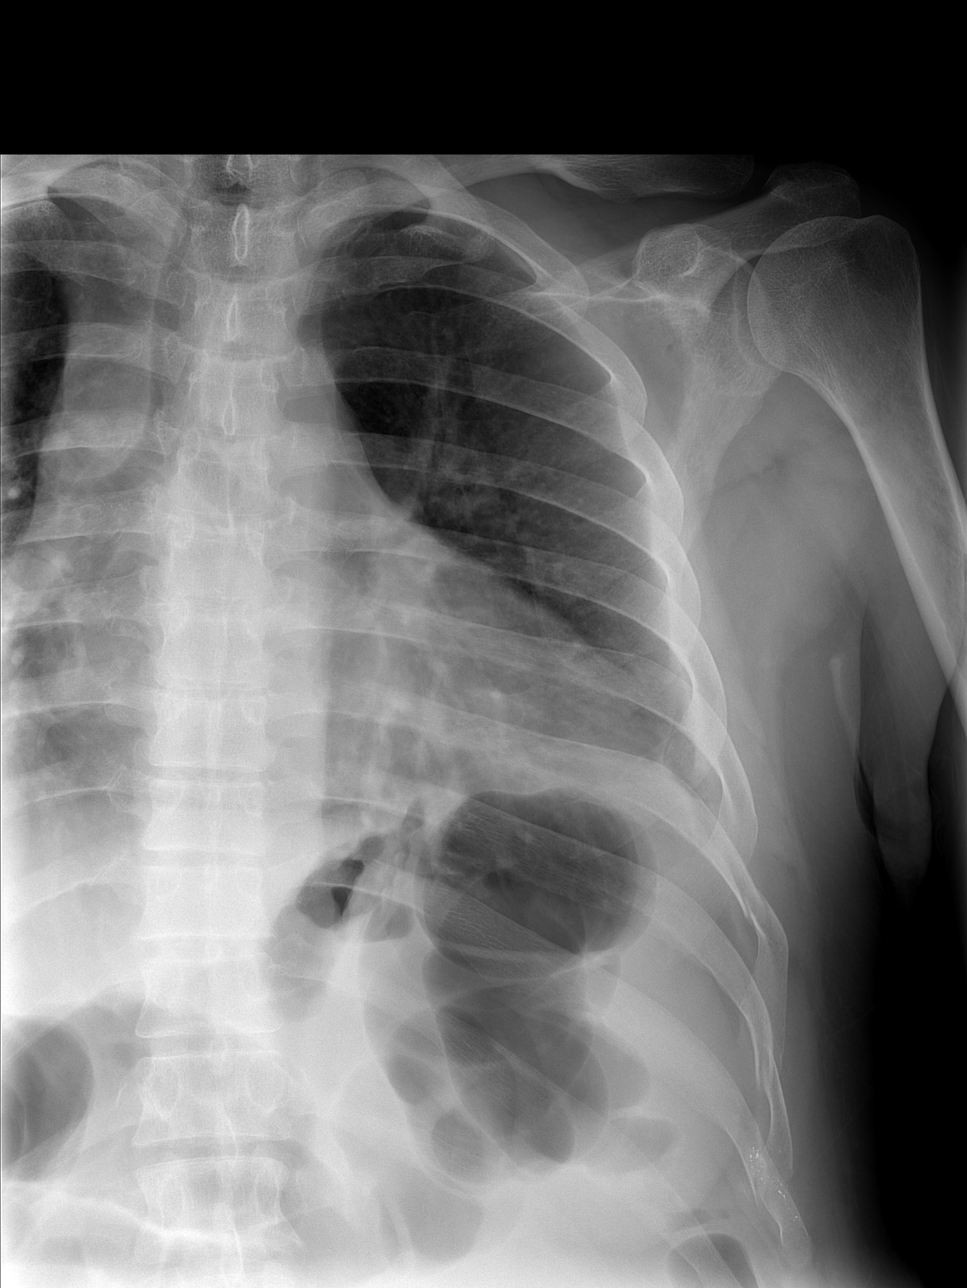

[w ribs oblique left]
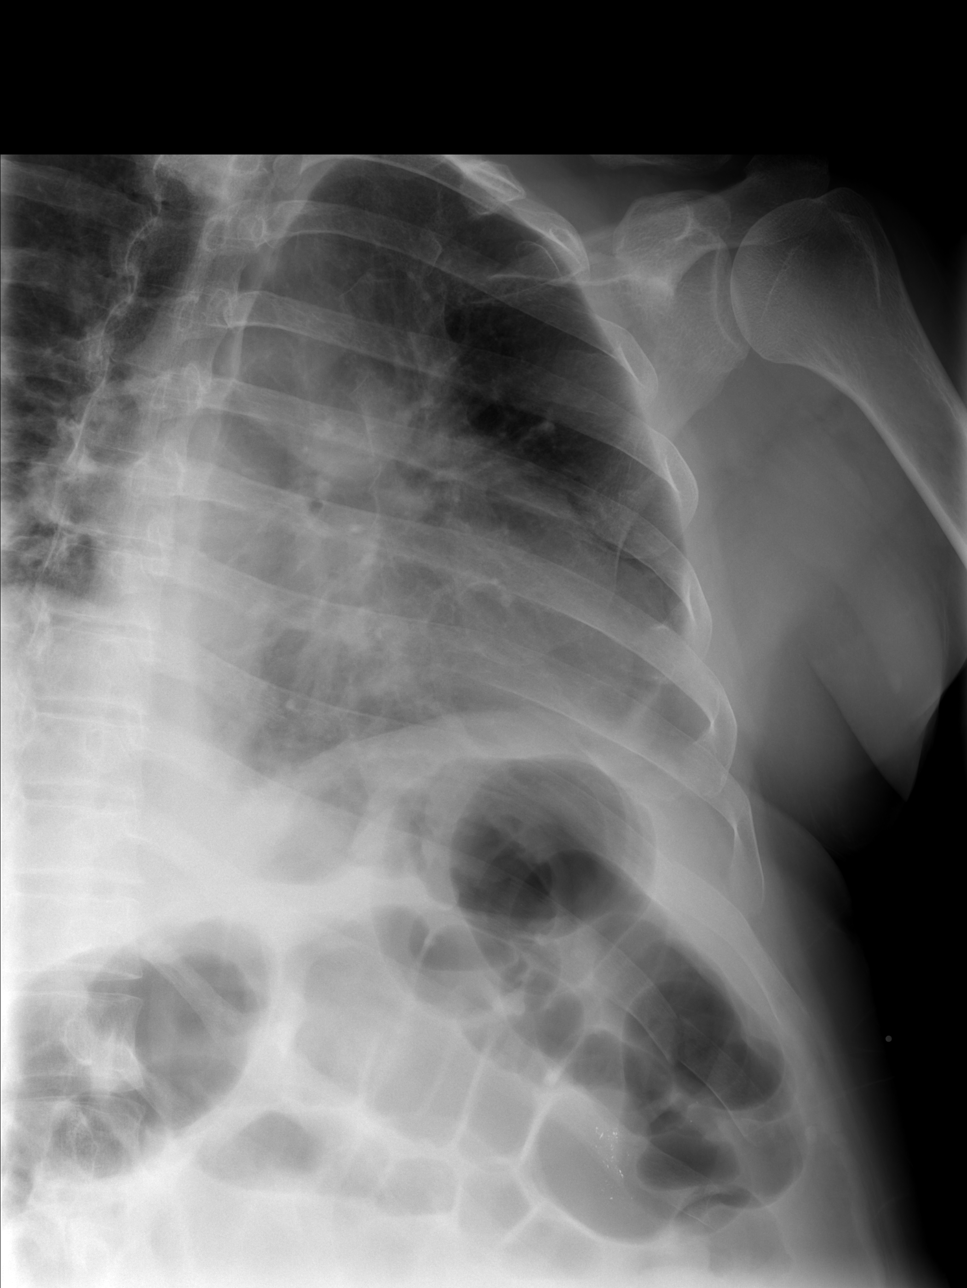

[w ribs ap/pa lower left]
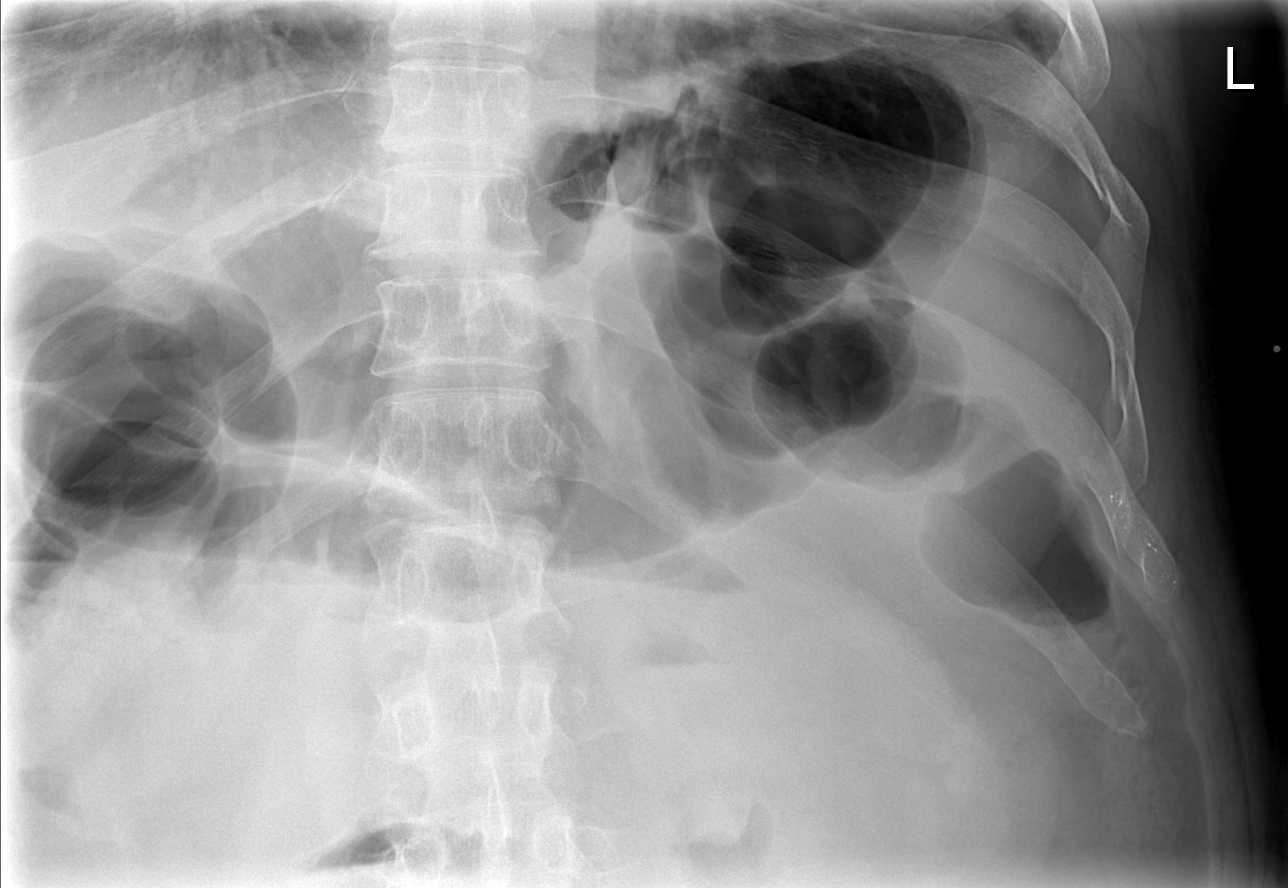

[4 of 4 positions shown; findings below may reference images not displayed]

FINDINGS: There are low lung volumes.

The heart size is enlarged and there is pulmonary venous congestion. 

No pleural effusions or overt pulmonary edema is noted.

Three coned-down views of the left ribs shows acute fractures involving the left
8th, 9th and 10th ribs.

Gaseous distention of large bowel loops is noted within the upper abdomen.
IMPRESSION: 1. Acute left-sided rib fractures.
2. Cardiac enlargement and pulmonary venous congestion.

## 2006-10-25 ENCOUNTER — Inpatient Hospital Stay (HOSPITAL_COMMUNITY): Admission: EM | Admit: 2006-10-25 | Discharge: 2006-10-26 | Payer: Self-pay | Admitting: Emergency Medicine

## 2006-11-01 ENCOUNTER — Ambulatory Visit: Payer: Self-pay | Admitting: Internal Medicine

## 2006-11-15 ENCOUNTER — Ambulatory Visit: Payer: Self-pay | Admitting: Cardiology

## 2006-11-15 DIAGNOSIS — S2239XA Fracture of one rib, unspecified side, initial encounter for closed fracture: Secondary | ICD-10-CM

## 2006-11-15 DIAGNOSIS — M25559 Pain in unspecified hip: Secondary | ICD-10-CM

## 2006-11-15 HISTORY — DX: Fracture of one rib, unspecified side, initial encounter for closed fracture: S22.39XA

## 2006-11-22 ENCOUNTER — Encounter (INDEPENDENT_AMBULATORY_CARE_PROVIDER_SITE_OTHER): Payer: Self-pay | Admitting: *Deleted

## 2006-12-25 DIAGNOSIS — M1990 Unspecified osteoarthritis, unspecified site: Secondary | ICD-10-CM | POA: Insufficient documentation

## 2007-05-22 ENCOUNTER — Encounter: Admission: RE | Admit: 2007-05-22 | Discharge: 2007-05-22 | Payer: Self-pay | Admitting: Endocrinology

## 2007-05-22 IMAGING — CT CT CHEST W/O CM
2 of 4 series · 15 of 36 positions shown, 18 images · IV contrast (agent unspecified)
Comparison: none

CLINICAL DATA: [REDACTED] two view chest x-ray [DATE], abdominal CT [DATE], PA chest and left rib radiographs [DATE], and nuclear medicine perfusion-ventilation scan [DATE] with portable chest x-ray.   Prior history left rib fractures.  IV contrast allergy.  Smoker with current left-sided chest pain, cough, and shortness of breath.  Followup right lung nodule. 
CHEST CT WITHOUT CONTRAST:
TECHNIQUE: Multidetector CT imaging of the chest was performed following the standard protocol without IV contrast.

[Series 3: routine chest · axial · 0.78mm/px · z∈[-298,+2]mm · 12 of 70 slices shown, 15 images]
[im 5/70  mediastinal]
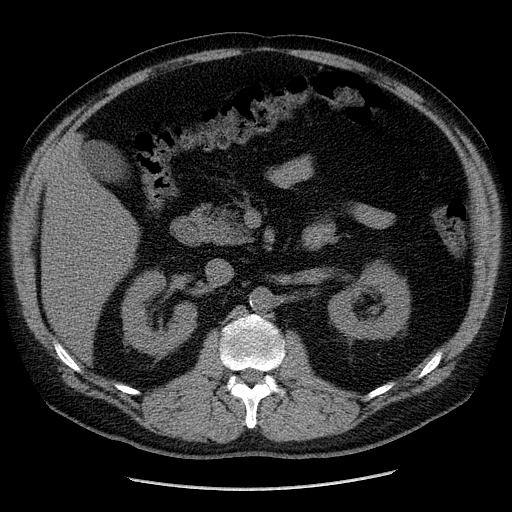
[im 5/70  lung]
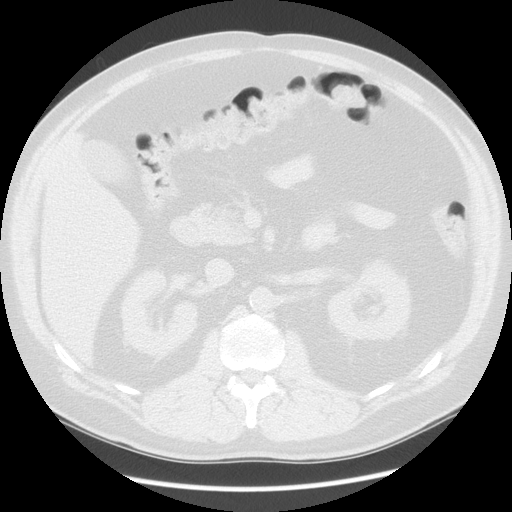
[im 10/70  lung]
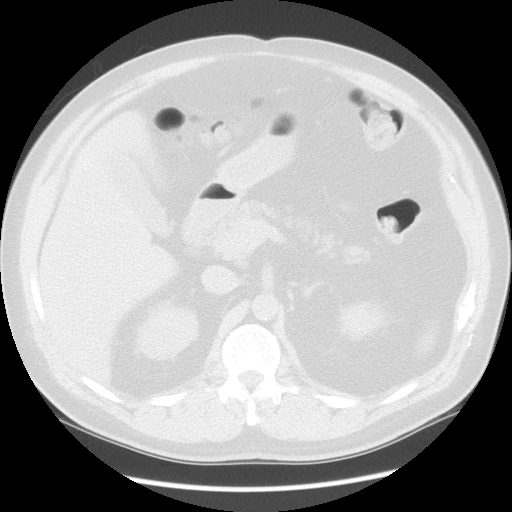
[im 15/70  lung]
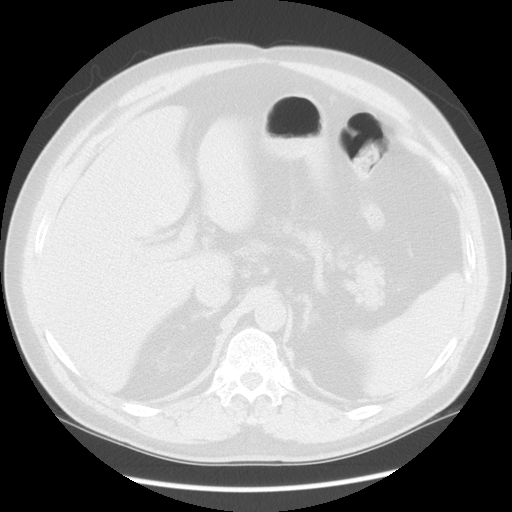
[im 20/70  lung]
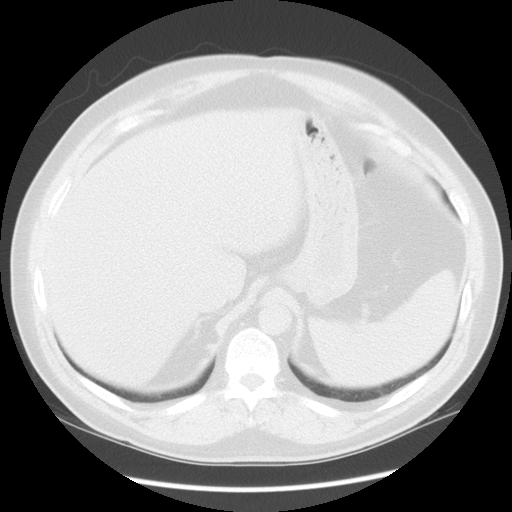
[im 25/70  mediastinal]
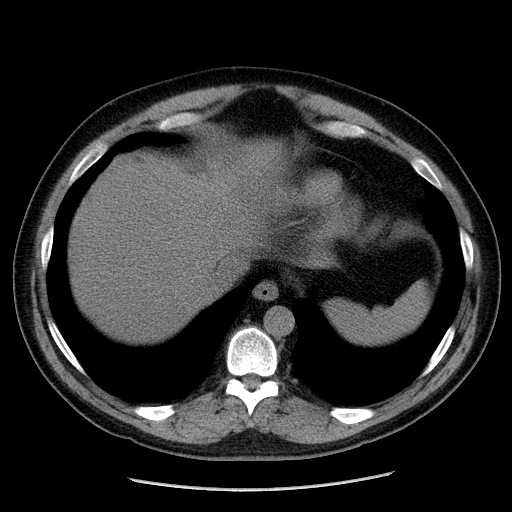
[im 25/70  lung]
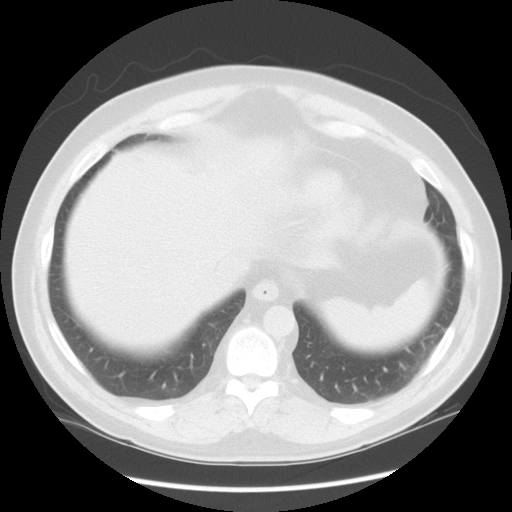
[im 30/70  lung]
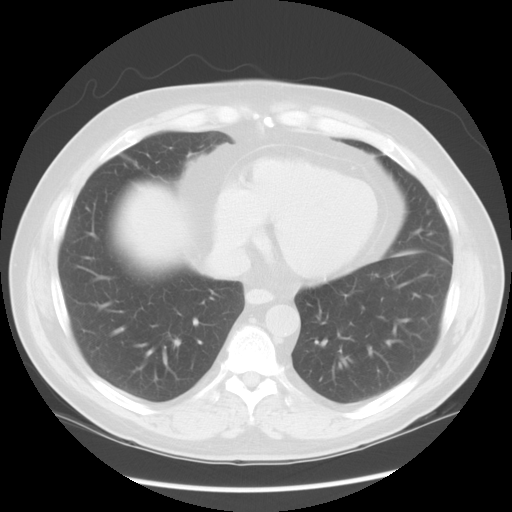
[im 40/70  lung]
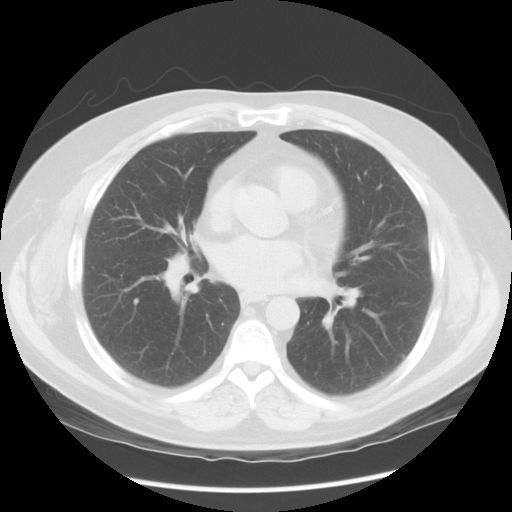
[im 45/70  lung]
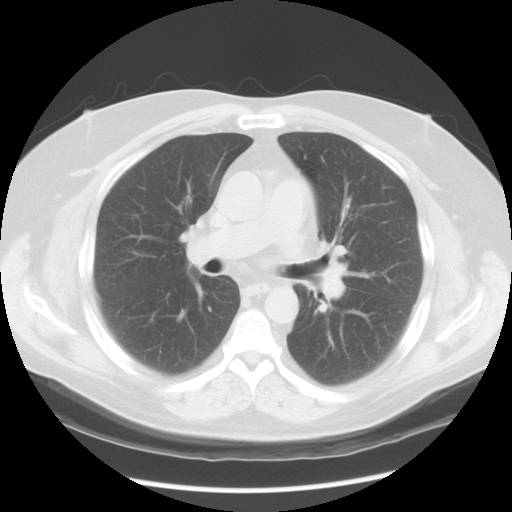
[im 50/70  mediastinal]
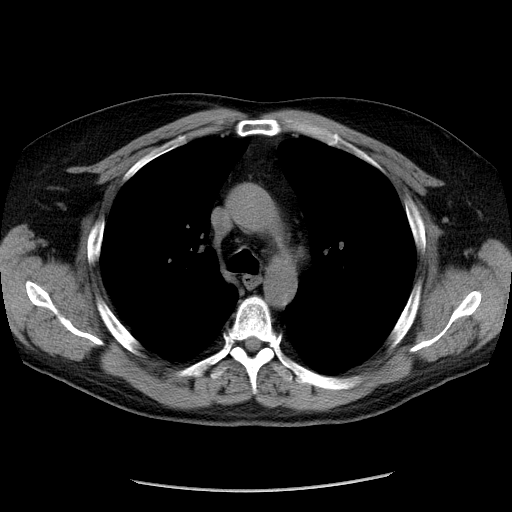
[im 50/70  lung]
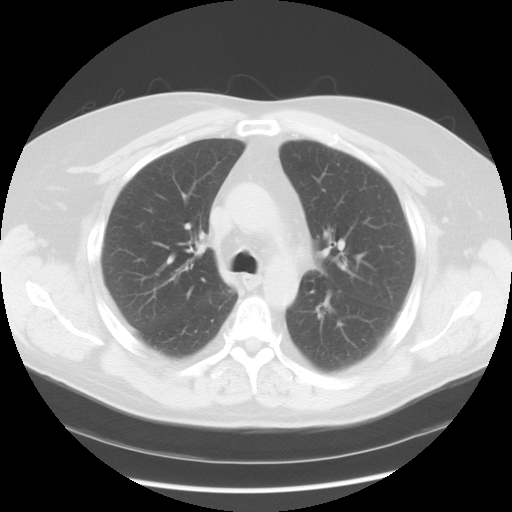
[im 55/70  lung]
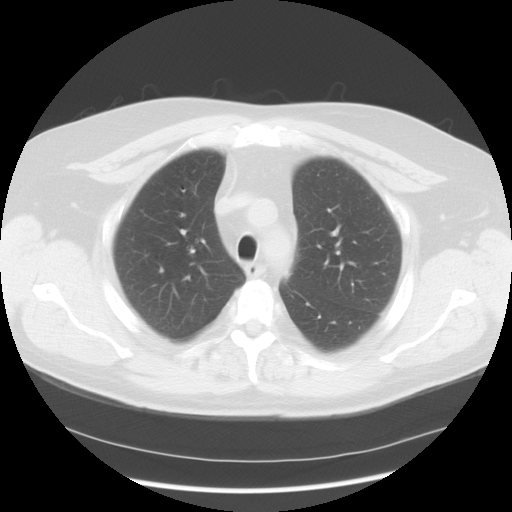
[im 60/70  lung]
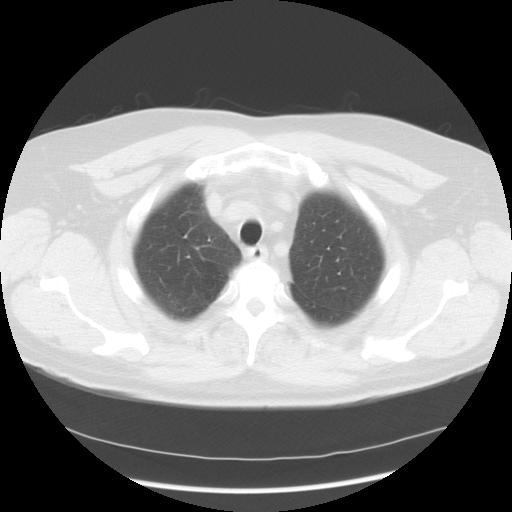
[im 65/70  lung]
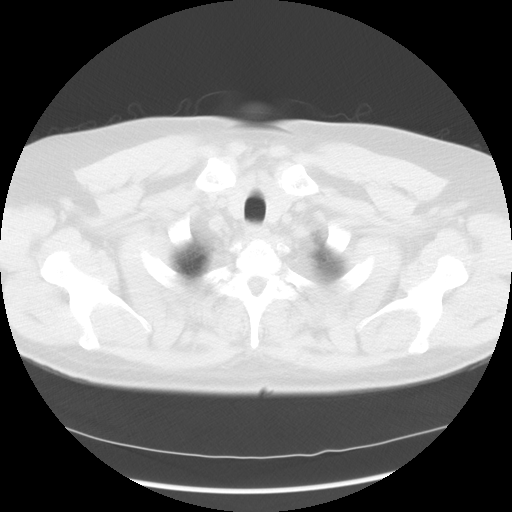

[Series 602: sagittal body · sagittal · 0.78mm/px · 3 of 160 slices shown]
[im 32/160  lung]
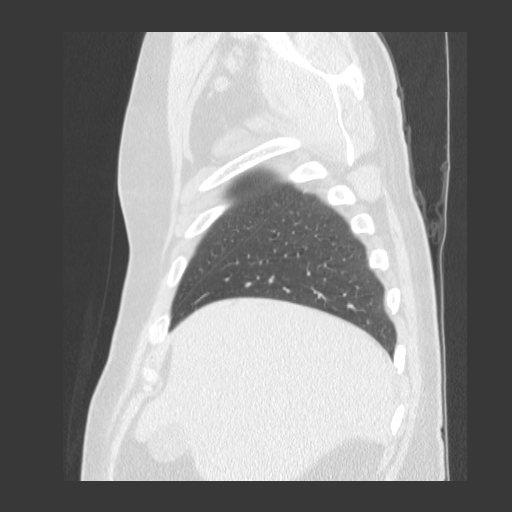
[im 64/160  lung]
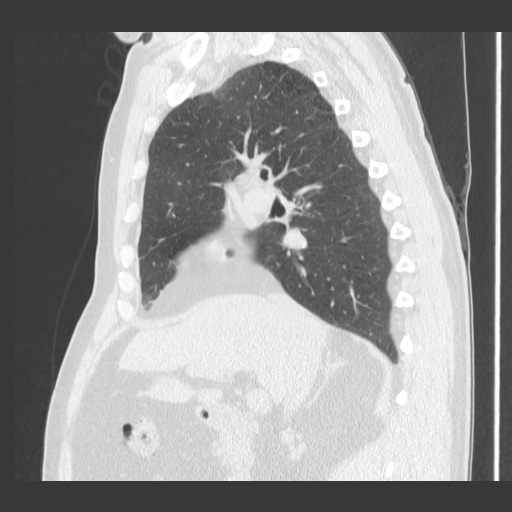
[im 96/160  lung]
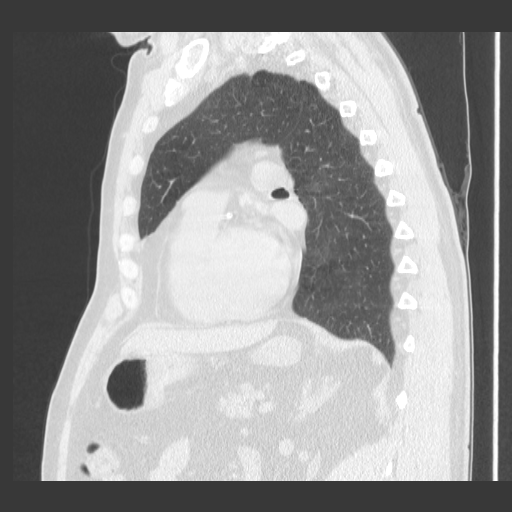

[15 of 36 positions shown; findings below may reference images not displayed]

FINDINGS: Ununited nondisplaced fracture deformities are seen at the left 9th and 10th ribs with healed fracture deformity left 8th rib.  Left and right coronary arterial stents are seen with normal heart size.  No mediastinal, hilar (allowing for no intravenous contrast enhancement) nor axillary mass/adenopathy is seen.  Slight diffuse fatty infiltration of the liver is noted.   1.9 cm accessory spleen and findings of slight diffuse fatty infiltration of the liver noted.  Upper abdominal organs are otherwise normal appearing.  2 x 4 mm likely subpleural scarring is seen at the lateral right upper lobe (image 20) with 4 mm right lower lobe noncalcified nodule (image 28) and 5 mm noncalcified nodule at the posterolateral left lung base (image 39) are seen.  Benign-appearing linear scarring or atelectasis is noted at the right middle and lingula lobes.  Lungs are otherwise clear.
IMPRESSION: 1.  Small bilateral lower lobe noncalcified nodules in high risk patient initial followup CT chest 6 to 12 months and if stable then at 18 to 24 months is advised for further evaluation. 
2.  Additional chronic appearing findings as described. 
3.  No acute abnormality.

## 2007-10-08 ENCOUNTER — Ambulatory Visit (HOSPITAL_COMMUNITY): Admission: RE | Admit: 2007-10-08 | Discharge: 2007-10-08 | Payer: Self-pay | Admitting: Endocrinology

## 2007-10-08 IMAGING — CT CT CHEST W/O CM
3 series · 17 of 29 positions shown, 19 images · non-contrast
Comparison: [DATE]

CLINICAL DATA: Evaluate right-sided lung nodule.  Shortness of
breath.  Smoker.

CT CHEST WITHOUT CONTRAST
TECHNIQUE: Multidetector CT imaging of the chest was performed
following the standard protocol without IV contrast.

[Series 2: chest without · axial · non-contrast · 0.88mm/px · z∈[-299,-74]mm · 7 of 64 slices shown, 9 images]
[im 10/64  mediastinal]
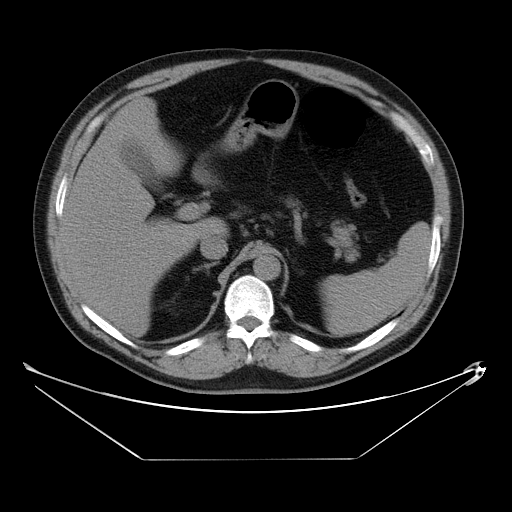
[im 10/64  lung]
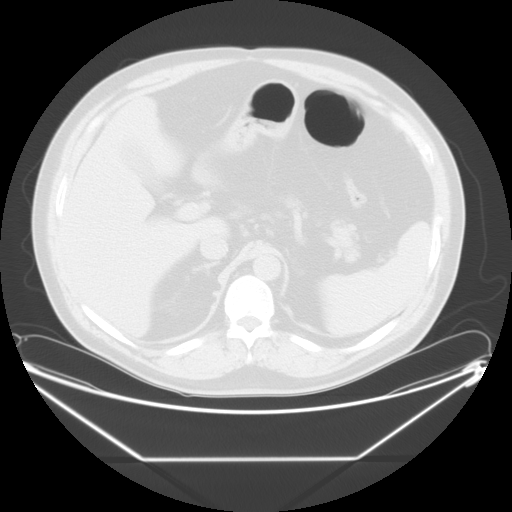
[im 19/64  lung]
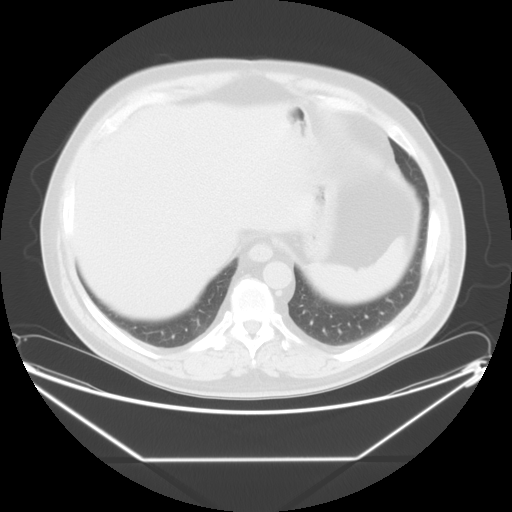
[im 28/64  lung]
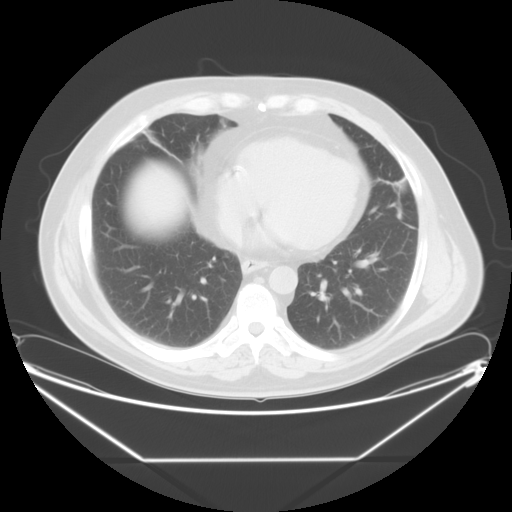
[im 37/64  lung]
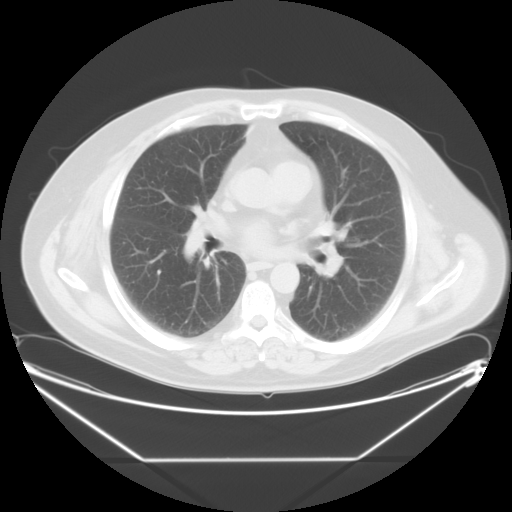
[im 38/64  mediastinal]
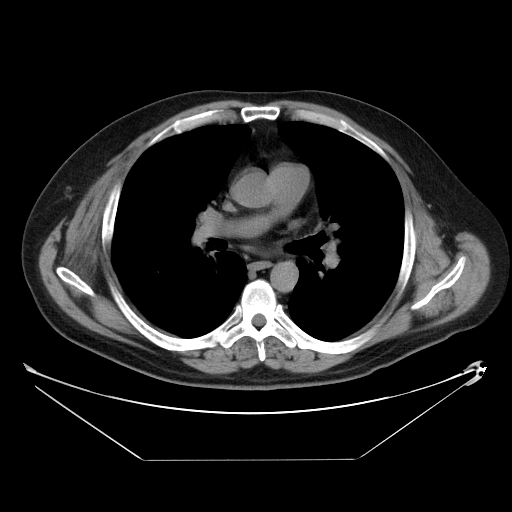
[im 38/64  lung]
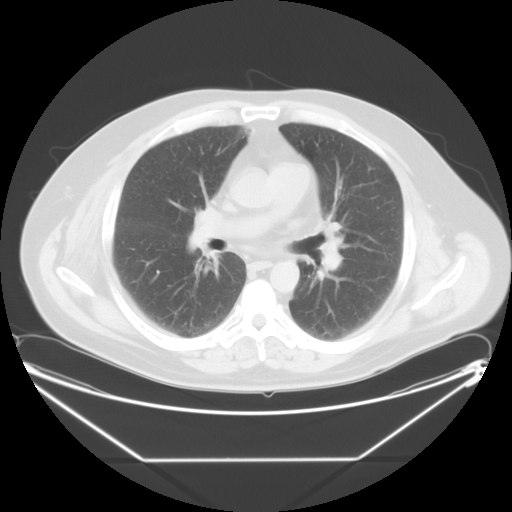
[im 46/64  lung]
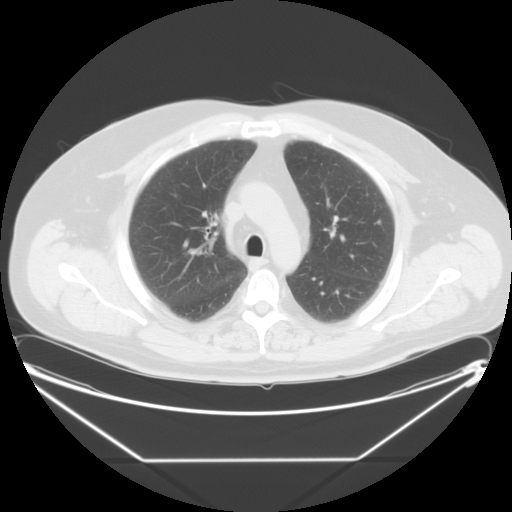
[im 55/64  lung]
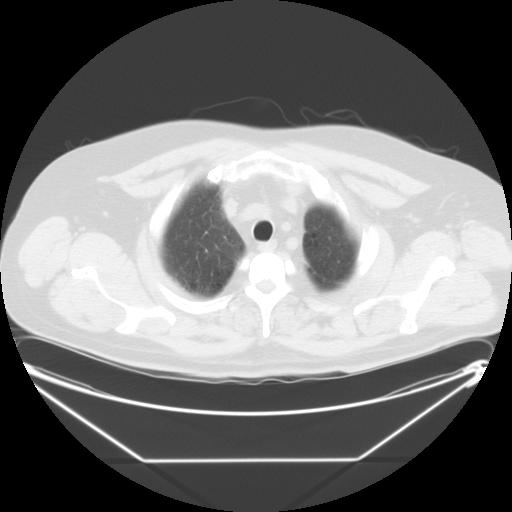

[Series 400: sagittal chest · sagittal · 0.88mm/px · 8 of 79 slices shown]
[im 8/79  lung]
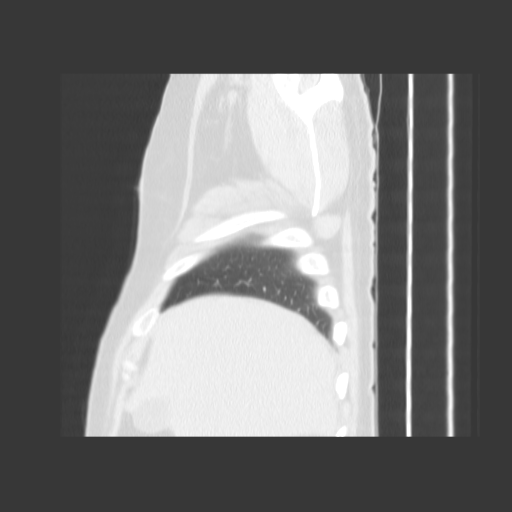
[im 16/79  lung]
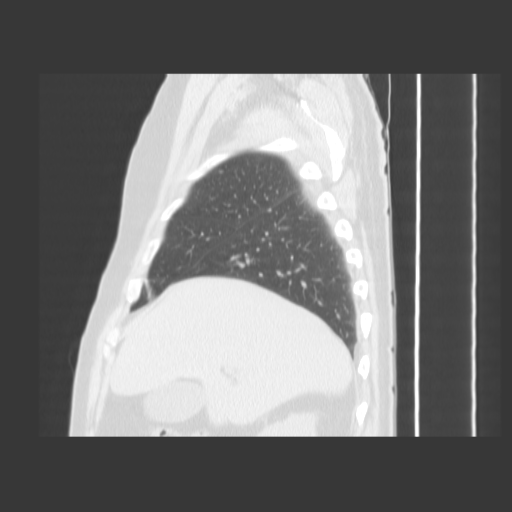
[im 24/79  lung]
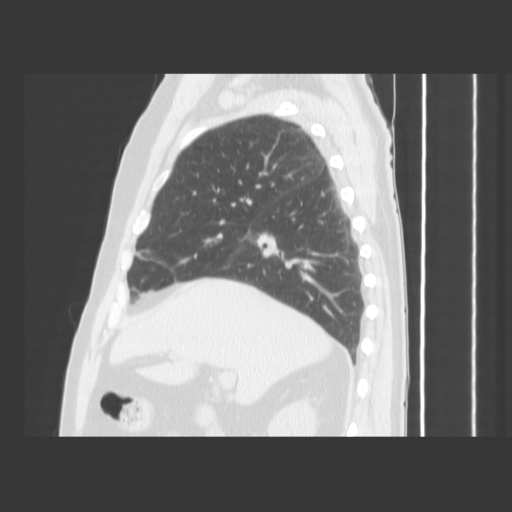
[im 32/79  lung]
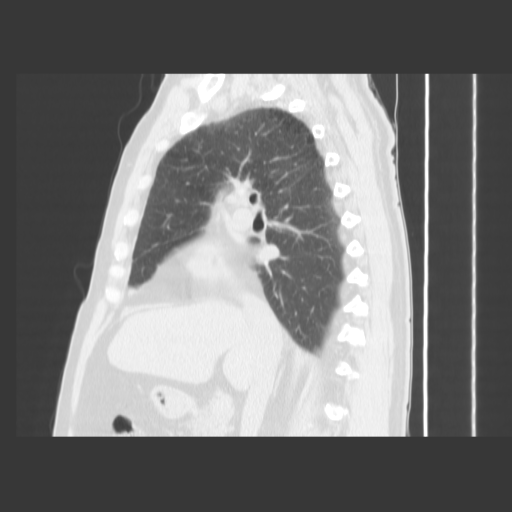
[im 47/79  lung]
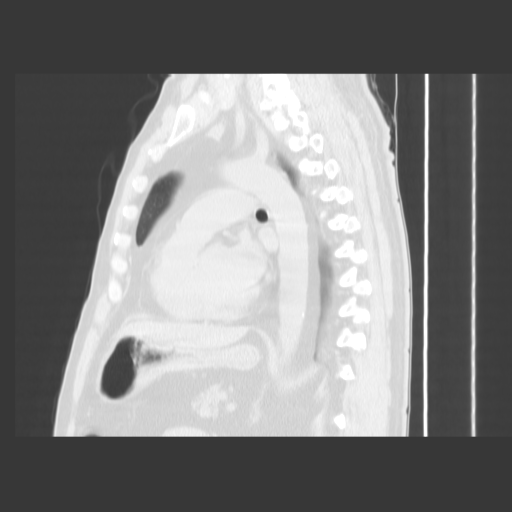
[im 55/79  lung]
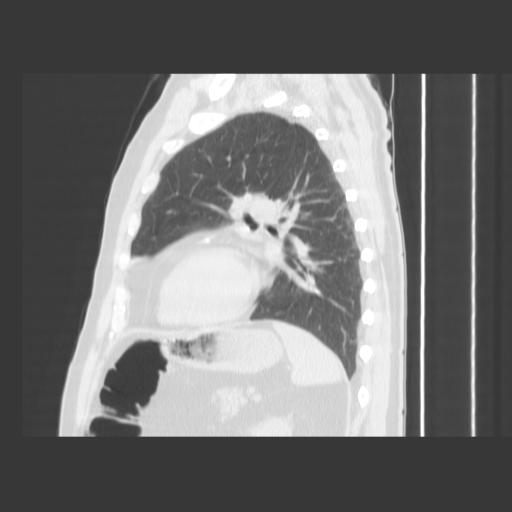
[im 63/79  lung]
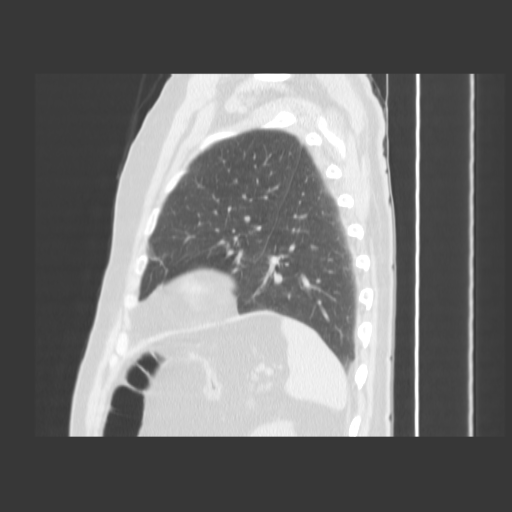
[im 71/79  lung]
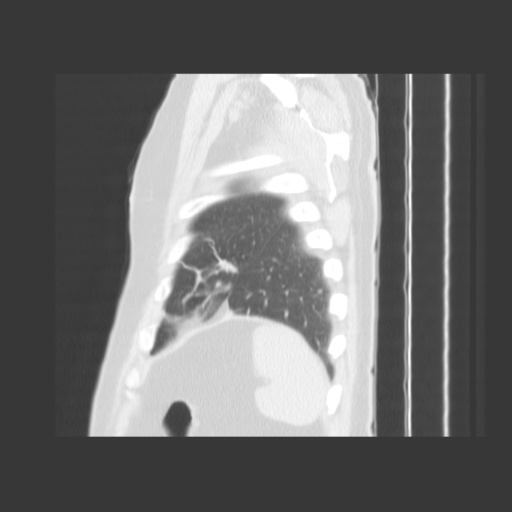

[Series 401: coronal chest · coronal · 0.88mm/px · 2 of 63 slices shown]
[im 8/63  lung]
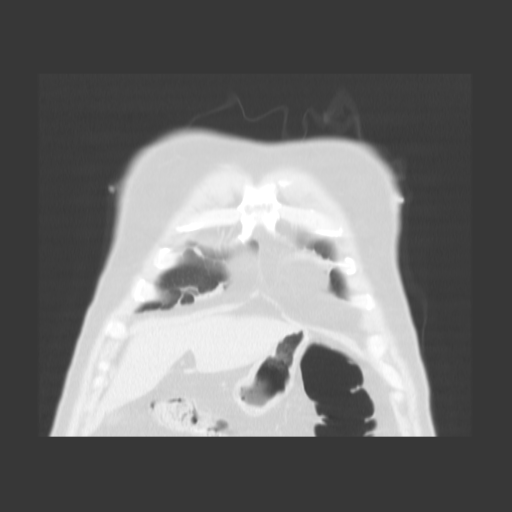
[im 16/63  lung]
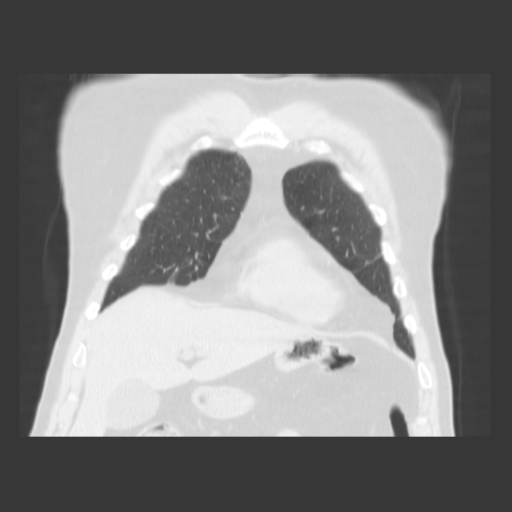

[17 of 29 positions shown; findings below may reference images not displayed]

FINDINGS: Lung windows demonstrate mild central lobular emphysema.
Similar 3 - 4 mm right upper lobe lung nodule on image 18.
Atelectasis or scar in the right middle lobe is grossly similar.
nodule just posterior to the right major fissure is stable at 4 mm
on image 25.
Left upper lobe 4 mm nodule on image 19 is slightly more apparent
than on image 17 of the prior exam.  This could be due to
differences in slice selection.
Left upper lobe subpleural opacity measures 1.8 x 1.2 cm and is
felt to represent atelectasis on image 34.  New since the prior
exam.
Left lower lobe 5 mm nodule is stable on image 39.
More superior left lower lobe nodule measures 5 mm on image 30 and
is stable.

Soft tissue windows demonstrate heart size upper limits of normal.
No pericardial or pleural effusion.  LAD coronary artery stent. No
mediastinal or hilar adenopathy.

Limited abdominal imaging demonstrates mild fatty infiltration of
the liver.  Splenule.  Right upper pole renal angiomyolipoma
unchanged.  Numerous nonacute left-sided rib fractures.
IMPRESSION: 1.  Overall similar appearance of numerous bilateral lung nodules.
As described, some nodules are slightly more apparent today,
presumably due to differences in slice selection.  Ongoing CT
follow-up at 6 months is recommended.
2.  Probable atelectasis in the left upper lobe.  This is new since
the prior exam and warrants followup attention.
3.  Mild fatty infiltration of the liver.

## 2008-04-17 ENCOUNTER — Encounter: Admission: RE | Admit: 2008-04-17 | Discharge: 2008-04-17 | Payer: Self-pay | Admitting: Endocrinology

## 2008-04-17 IMAGING — CT CT CHEST W/O CM
2 of 4 series · 15 of 36 positions shown, 18 images · non-contrast
Comparison: [DATE].

CLINICAL DATA: Follow-up lung nodules.

CT CHEST WITHOUT CONTRAST
TECHNIQUE: Multidetector CT imaging of the chest was performed
following the standard protocol without IV contrast.

[Series 3: routine chest · axial · 0.78mm/px · z∈[-304,-8]mm · 12 of 69 slices shown, 15 images]
[im 5/69  mediastinal]
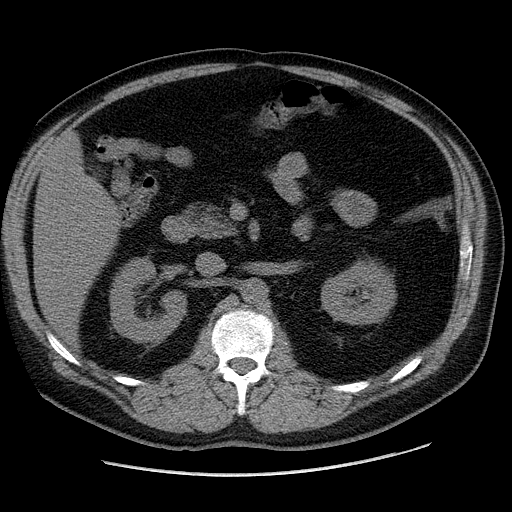
[im 5/69  lung]
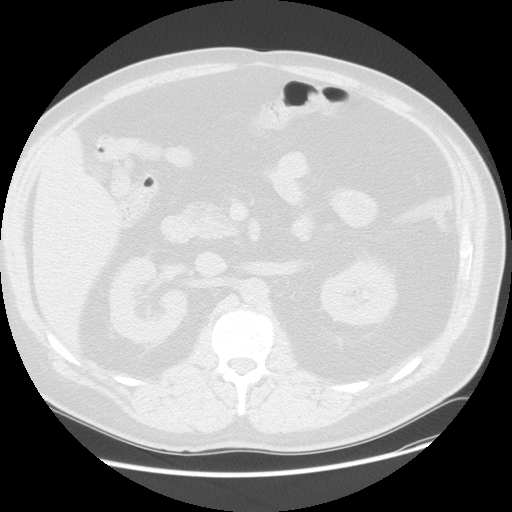
[im 10/69  lung]
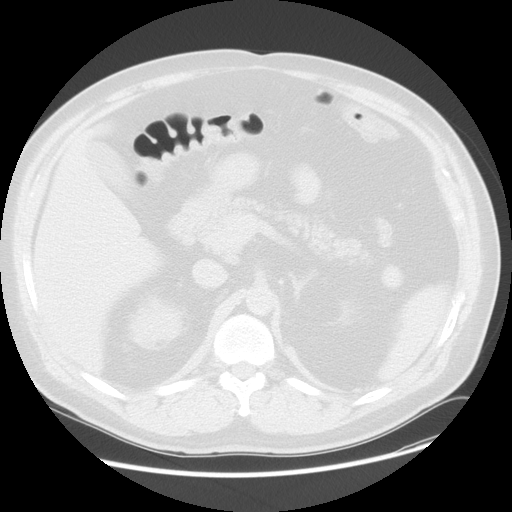
[im 15/69  lung]
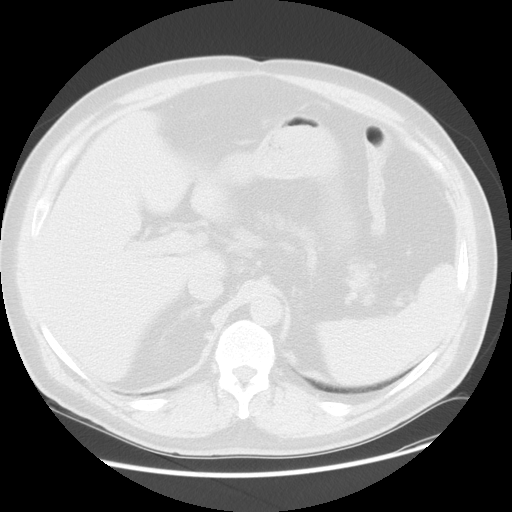
[im 20/69  lung]
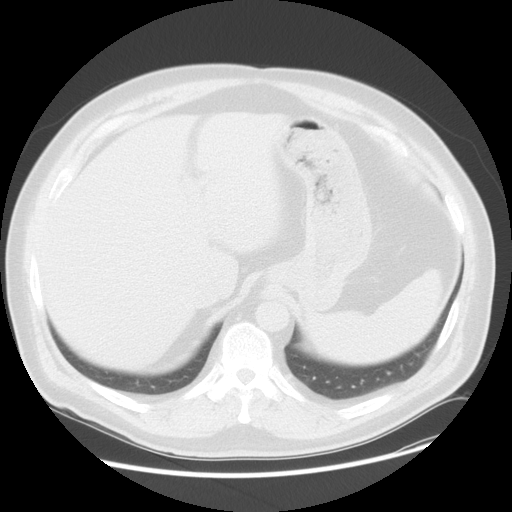
[im 25/69  mediastinal]
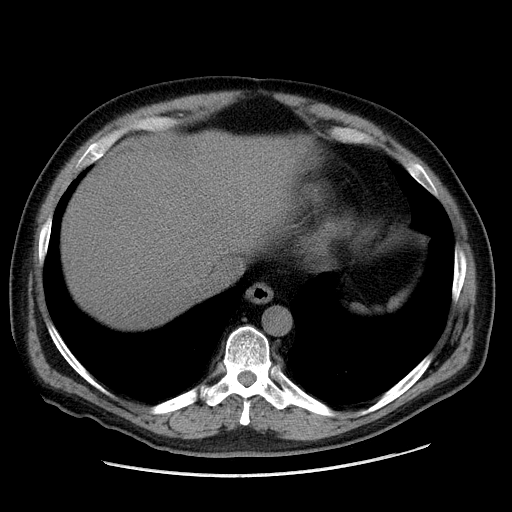
[im 25/69  lung]
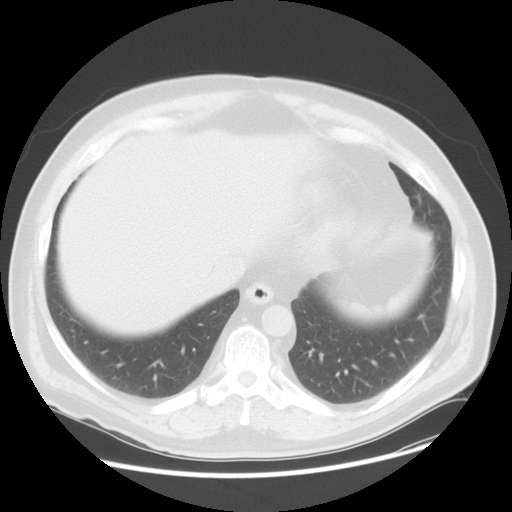
[im 30/69  lung]
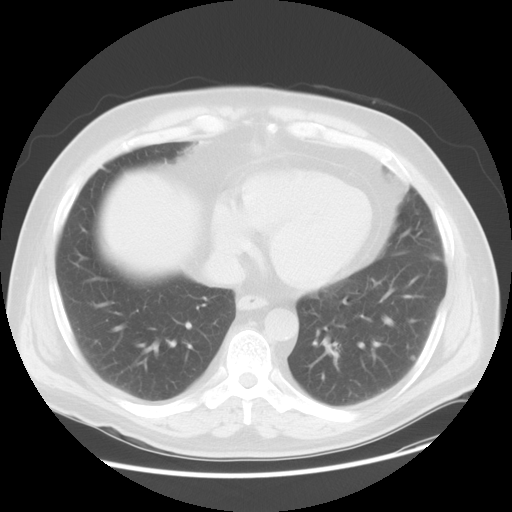
[im 39/69  lung]
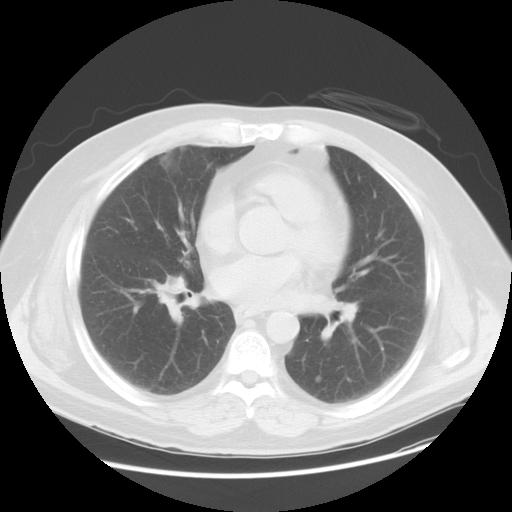
[im 44/69  lung]
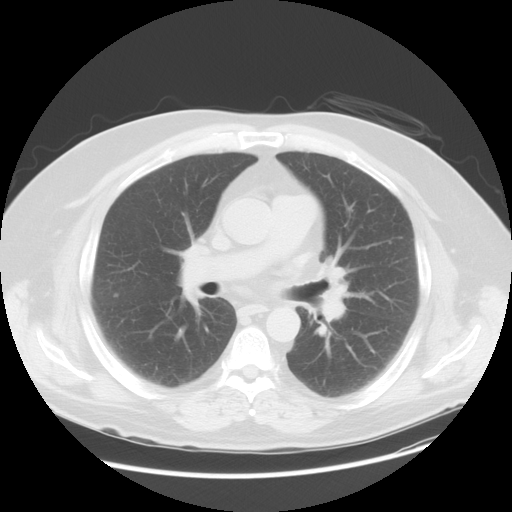
[im 49/69  mediastinal]
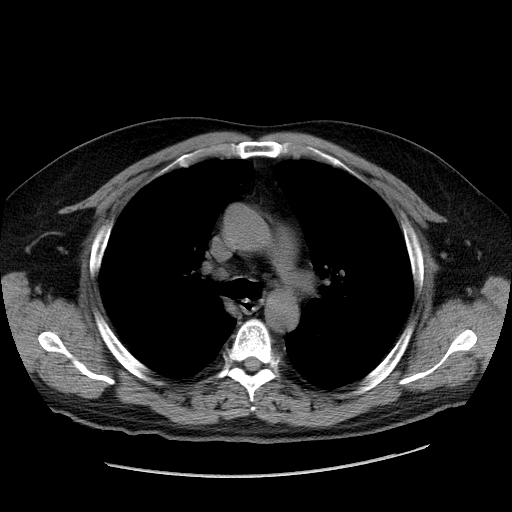
[im 49/69  lung]
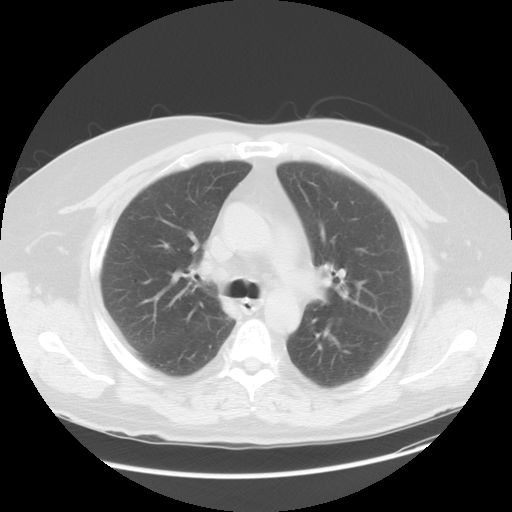
[im 54/69  lung]
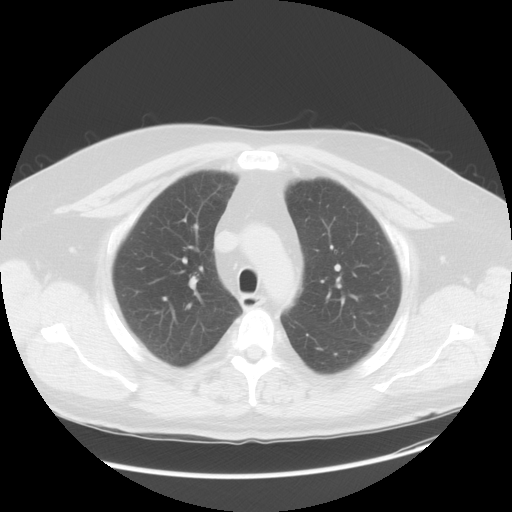
[im 59/69  lung]
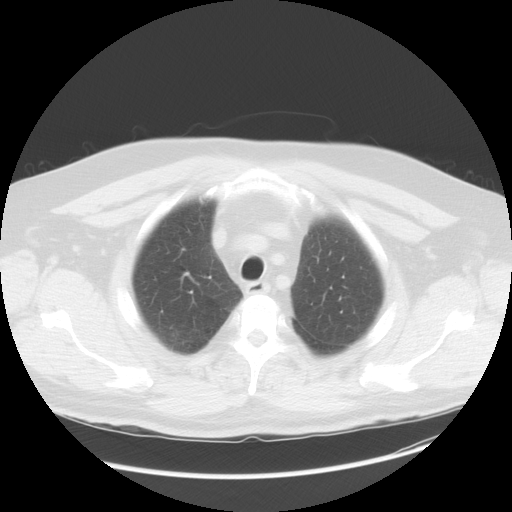
[im 64/69  lung]
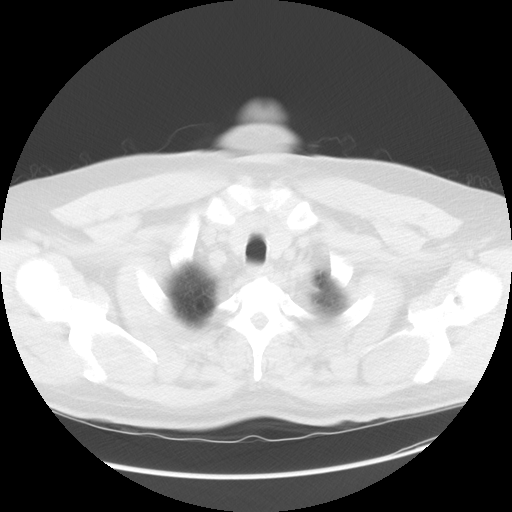

[Series 602: sagittal body · sagittal · 0.78mm/px · 3 of 161 slices shown]
[im 33/161  lung]
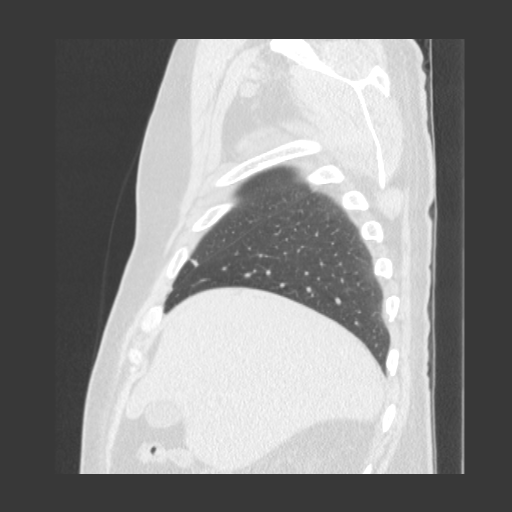
[im 65/161  lung]
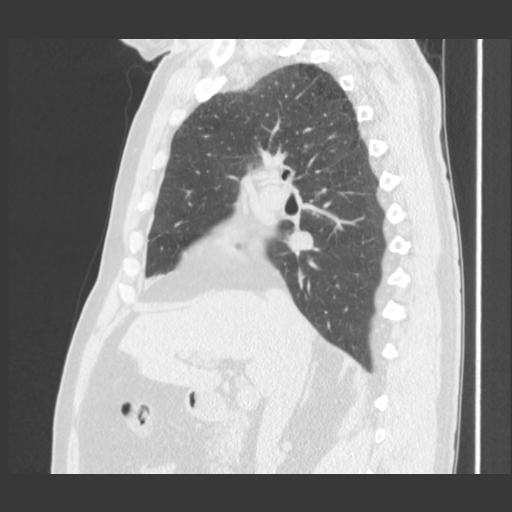
[im 97/161  lung]
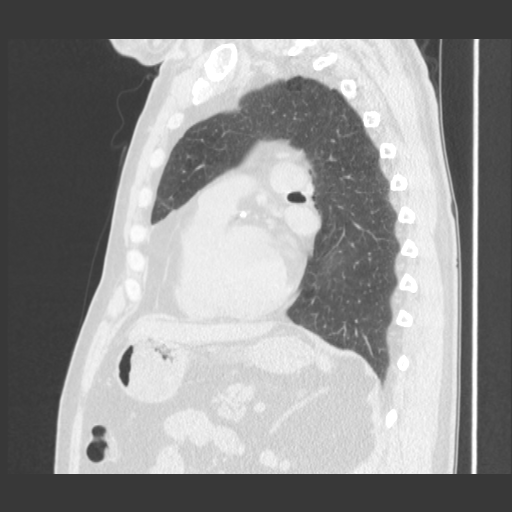

[15 of 36 positions shown; findings below may reference images not displayed]

FINDINGS: Bilateral pulmonary nodules are unchanged.  All nodules
are under 1 cm.  The largest nodule in the left lower lobe measures
5.2 x 5.9 cm.  There is also a 5 mm left lower lobe nodule on image
number 40.

Mild scarring in the right middle lobe is unchanged.  Lingular
atelectasis has resolved since the prior study.  There is no
dominant mass or adenopathy.  There is no acute infiltrate or
effusion.  Coronary artery stents are noted in the right and left
coronary arteries.
IMPRESSION: Stable small lung nodules bilaterally.  These also appear unchanged
from  the study of [DATE] and are felt to be stable and likely
benign.  If the patient has a known malignancy or significant risk
factor for malignancy, follow-up CT in 6 months could be performed.

REF:G3 DICTATED: [DATE] [DATE]

## 2010-03-28 ENCOUNTER — Encounter: Payer: Self-pay | Admitting: Endocrinology

## 2010-07-20 NOTE — Discharge Summary (Signed)
NAME:  [name] [name], [name] [name] NO.:  [id number]   MEDICAL RECORD NO.:  [id number]          PATIENT TYPE:  INP   LOCATION:  6715                         FACILITY:  [facility]   PHYSICIAN:  Elliot Cousin, M.D.    DATE OF BIRTH:  1956/02/08   DATE OF ADMISSION:  10/24/2006  DATE OF DISCHARGE:                               DISCHARGE SUMMARY   DISCHARGE DIAGNOSES:  1. Chest wall pain secondary to left-sided rib fractures.  2. Nondisplaced fractures of the left ribs, 8-10, status post fall.  3. Abdominal pain secondary to fall.  4. Hypertension.  5. Hypoxia, thought to be secondary to splinting.  6. Coronary artery disease.  7. Tobacco abuse.   DISCHARGE MEDICATIONS:  1. Percocet 5 mg 1-2 tablets every 4 hours as needed for pain      (dispense #45).  2. Metoprolol 100 mg daily.  3. Benazepril 5 mg daily (hold until you see your doctor in 1 week).  4. Norvasc 10 mg daily (hold until you see your doctor in 1 week).  5. Plavix 75 mg daily.  6. Crestor 10 mg daily.  7. Zoloft 150 mg daily.  8. Tricor 145 mg daily.  9. Protonix 40 mg daily.   DISCHARGE DISPOSITION:  The patient was discharged to home in improved  and stable condition on October 26, 2006.  He was advised to follow up  with his primary care physician at [facility] in 1 week and with his  cardiologist, Dr. Excell Seltzer, in 1-2 weeks.   CONSULTATIONS:  None.   PROCEDURES PERFORMED:  1. V/Q scan performed on October 25, 2006.  The results revealed low      probability of pulmonary embolism.  2. Chest x-ray on October 24, 2006.  The results revealed low lung      volumes with bibasilar atelectasis, 2 left-sided rib fractures and      no definite pneumothorax.  3. CT scan of the abdomen and pelvis on October 24, 2006.  The results      revealed nondisplaced fractures through the lateral aspect of the      left ribs 8-10 with adjacent tiny metallic foreign bodies at the      level of the 10th rib fracture.  Negative for  acute intraabdominal      process.  CT scan of the pelvis revealed a decompressed colon, no      free fluid and no adenopathy.  4. X-ray of the ribs on October 24, 2006.  The results revealed acute      left-sided rib fractures, cardiac enlargement and pulmonary venous      congestion.   HISTORY OF PRESENT ILLNESS:  The patient is a 55 year old man with a  past medical history significant for coronary artery disease,  hypertension and tobacco abuse, who presented to the emergency  department on October 24, 2006, after he fell out of the bed hitting a  large tin container on the floor. The patient felt immediate left-sided  chest pain and abdominal pain.  He also had associated shortness of  breath and pleurisy.  He therefore  presented to the emergency department  for further management.  When the patient arrived to the emergency  department he was noted to be hemodynamically stable although he was  relatively hypoxic with his oxygen saturations ranging from 88% to 90%  on 4 L of oxygen.  The patient was therefore admitted for further  evaluation and management.   For additional details please see the dictated history and physical.   HOSPITAL COURSE:  1. CHEST WALL PAIN SECONDARY TO RIB FRACTURES.  As indicated above the      patient's x-ray of the ribs revealed fractures at ribs #8-10.  A      followup chest x-ray revealed no pneumothorax.  The patient was      started on pain management with Dilaudid as needed, Tylenol as      needed, and Oxycodone as needed.  However because of the exquisite      discomfort the patient was started on scheduled Percocet 4 times      daily in addition to the p.r.n. medications.  Over the course of      the hospitalization the patient's pain subsided but it did not      resolve.  Therefore the patient will be discharged to home on      Percocet 5 mg 1-2 tablets every 4-6 hours as needed for pain.  The      patient was advised to follow up with his  primary care physician      for further pain management.   1. HYPOXIA.  As indicated above, the patient's oxygen saturations      ranged from 88% to 90% on 4 L of nasal cannula oxygen in the      emergency department.  His ABG revealed a pH of 7.3, pCO2 of 49 and      pO2 of 62 on what is presumed to be 4 L of oxygen.  The hypoxemia      was thought to be secondary to the splinting from the rib fractures      and possibly underlying COPD from long-term tobacco abuse.  For      further evaluation, a V/Q scan was ordered to rule out PE.  The V/Q      scan revealed low probability for pulmonary embolism.  The patient      was treated with oxygen therapy throughout the hospitalization.      His oxygen saturations improved.  He began ambulating in the room      and the hallway.  Prior to hospital discharge his oxygen saturation      was 92% on room air.  The patient was advised to stop smoking.   1. RELATIVE HYPOTENSION WITH A HISTORY OF HYPERTENSION.  The patient      is treated chronically with benazepril, Norvasc and either b.i.d.      or t.i.d. dosing of metoprolol.  His blood pressures during the      first 24 hours ranged into the low 100s systolically.  His      antihypertensive medications were withheld initially, however      metoprolol at 12.5 mg b.i.d. was started later.  His blood      pressures are now ranging in the 110-120 systolically.  Therefore      the patient was advised to decrease his usual dose of metoprolol to      100 mg (50 mg b.i.d.).  He was also advised to not take the  benazepril and Norvasc until he follows up with his primary care      physician and/or cardiologist in 1 week.   1. HISTORY OF CORONARY ARTERY DISEASE.  Cardiac markers and cardiac      enzymes were ordered during the hospitalization.  His CK was mildly      elevated however his troponin-Is were within normal limits.  In      addition his TSH was assessed and found to be within normal  limits      at 1.4.      Elliot Cousin, M.D.  Electronically Signed     DF/MEDQ  D:  10/26/2006  T:  10/26/2006  Job:  [id number]   cc:   Lisabeth Register. Excell Seltzer, MD

## 2010-07-20 NOTE — Assessment & Plan Note (Signed)
[facility]                            CARDIOLOGY OFFICE NOTE   NAME:[name] [name] STETZER                       MRN:          [id number]  DATE:08/28/2006                            DOB:          08/12/1955    [name] [name] returned for followup as an outpatient at the [facility]  Cardiology Office on August 28, 2006.  He is a 55 year old gentleman with  coronary artery disease and prior drug-eluting stent placement in his  LAD and right coronary artery who presented for followup today.  When he  was last seen here in January of this year he reported stable Class II  angina.  He has responded well to medical therapy.  He tells me that  today is the best he has felt in a long time.  He has had one recent  episode of chest pain, it was approximately 2 weeks ago.  This occurred  with a high level of emotional stress.  Other than that his chest pain  has been improved.  He has not had any more symptoms in the last few  weeks.  He denies dyspnea, orthopnea, PND, edema, lightheadedness or  syncope.  He is tolerating his medications without any problems.   CURRENT MEDICATIONS:  1. Benazepril 5 mg daily.  2. Norvasc 10 mg daily.  3. Plavix 75 mg daily.  4. Crestor 10 mg daily.  5. Tramadol 40 mg as needed.  6. Zoloft 150 mg daily.  7. Metoprolol 50 mg twice daily.  8. Tricor 145 mg daily.  9. Protonix 40 mg daily.   ALLERGIES:  CONTRAST MEDIA.   PHYSICAL EXAMINATION:  The patient is alert and oriented, he is in no  acute distress.  Weight is 229 pounds, blood pressure is 106/73, heart  rate 65, respiratory rate 16.  HEENT:  Normal.  NECK:  Normal, carotid upstrokes without bruits, jugular venous pressure  is normal.  LUNGS:  Clear to auscultation bilaterally.  HEART:  Regular rate and rhythm without murmurs or gallops.  ABDOMEN:  Soft, obese, nontender, no organomegaly.  EXTREMITIES:  No clubbing, cyanosis, or edema.  Peripheral pulses are 2+  and equal  throughout.   EKG shows normal sinus rhythm that is within normal limits.   ASSESSMENT:  [name] [name] is currently stable from a cardiovascular  standpoint.  His cardiac problems are as follows:  1. Coronary artery disease.  He underwent drug-eluting stent placement      in his right coronary artery and left anterior descending back in      2006 by Dr. Jacinto Halim.  He presented back in December of 2007 with      increased angina.  I increased his cardiac medications at that time      and he has responded very well to medical therapy.  I would favor      continuing this current medical regimen without any changes at this      time.  He was congratulated about smoking cessation.  He      discontinued cigarettes one month ago.  He otherwise is doing very  well. I would like to see him with an increase activity level and      even start exercising, but I think this is unlikely.  2. Dyslipidemia.  He is currently on Crestor and Tricor.  Most recent      lab work from [facility] was in October 2007 and it showed a      cholesterol of 136 with HDL of 36 and LDL of 79.  He should      continue on his current medication.  He is followed by Dr.      Margarette Canada.  He will require close followup of his liver function      tests while on combination therapy with a fibrate and statin      medication.  3. Hypertension.  Control is currently optimal on his medical regimen.  4. Followup.  I will plan on seeing [name] [name] back in 6 months or      sooner if any new problems arise.     Veverly Fells. Excell Seltzer, MD  Electronically Signed    MDC/MedQ  DD: 08/28/2006  DT: 08/28/2006  Job #: [id number]   cc:   Willis Modena, NP
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
## 2010-07-20 NOTE — Assessment & Plan Note (Signed)
[facility]                            CARDIOLOGY OFFICE NOTE   NAME:[name] [name], [name] [name]                       MRN:          [id number]  DATE:11/15/2006                            DOB:          11-28-1955    This is a patient of Dr. Tonny Bollman.   PRIMARY CARE PHYSICIAN:  [facility].   This is a 55 year old white male patient who presented to the hospital  with chest pain after falling out of the bed and fracturing his ribs.  He has nondisplaced fractures of his left ribs, 8-10, and had hypoxia  thought secondary to splinting.  The patient's CK-MBs were slightly  elevated, but troponins were negative.   V/Q scan was negative for pulmonary embolus.   Since he has been home, he says his breathing has improved greatly.  He  is quite sore in his chest and holds himself when he walks.  It still  hurts to take a deep breath.  He has continual discomfort in his chest.  It is constant but no significant chest tightness.   CURRENT MEDICATIONS:  1. The Benazepril is on hold as well as Norvasc because of low blood      pressure in the hospital.  2. Plavix 75 mg daily.  3. Crestor 10 mg daily.  4. Tramadol 50 mg p.r.n.  5. Zoloft 150 mg daily.  6. Metoprolol 50 mg.  He has only taken it once a day.  7. Tricor 145 mcg daily.  8. Protonix 40 mg daily.   PHYSICAL EXAMINATION:  This is a pleasant 55 year old white male who  smells of tobacco.  Blood pressure 128/80, pulse 75.  Weight 225.  NECK:  Without JVD, HJR, bruits, or thyroid enlargement.  LUNGS:  Clear anterior, posterior, and lateral.  HEART:  Regular rate and rhythm at 75 beats per minute.  Normal S1 and  S2.  Distant heart sounds.  No murmur heard.  ABDOMEN:  Obese.  Normoactive bowel sounds are heard throughout.  EXTREMITIES:  Without clubbing, cyanosis or edema.  There are good  distal pulses.   IMPRESSION:  1. Chest pain secondary to fractured ribs since fall.  2. Coronary artery  disease, status post drug-eluting stent to the left      anterior descending artery and right coronary artery in 2006 by Dr.      Jacinto Halim.  3. Tobacco abuse.  4. Dyslipidemia.  5. Hypertension with hypotension in the hospital.   PLAN:  Patient is stable from a cardiac standpoint. I have resumed his  Benazepril today, told him to hold off on his Norvasc for now and  continue his metoprolol at 50 mg a day rather than 100.  He will see Dr.  Excell Seltzer back in 1-2 months and will follow up with [facility] as well.      Jacolyn Reedy, PA-C  Electronically Signed      Luis Abed, MD, [facility]  Electronically Signed   ML/MedQ  DD: 11/15/2006  DT: 11/15/2006  Job #: [id number]

## 2010-07-23 NOTE — Cardiovascular Report (Signed)
NAME:  [name] [name], CALDRON NO.:  [id number]   MEDICAL RECORD NO.:  [id number]          PATIENT TYPE:  OIB   LOCATION:  2899                         FACILITY:  [facility]   PHYSICIAN:  Cristy Hilts. Jacinto Halim, MD       DATE OF BIRTH:  [birth year]   DATE OF PROCEDURE:  05/05/2004  DATE OF DISCHARGE:                              CARDIAC CATHETERIZATION   REFERRING PHYSICIAN:  Antionette Char, M.D.   PROCEDURES PERFORMED:  1.  Left ventriculography.  2.  Selective right and left coronary angiography.  3.  Intravascular ultrasound interrogation of the left anterior descending      artery.  4.  PTCA and stenting of the mid left anterior descending artery.   INDICATIONS FOR PROCEDURE:  [name] is a 55 year old gentleman with  history of hyperlipidemia, smoking history, history of coronary artery  disease and had undergone angioplasty and stenting of his mid RCA on April 02, 2004, with a 2.75 x 16 mm TAXUS stent.  He was having recurrent chest  discomfort similar to his angina prior to angioplasty.  Given this, he  underwent a Cardiolite stress test which had revealed inferior wall ischemia  and also anterior wall ischemia.  Given this, he was brought to the cardiac  catheterization lab for relook into this RCA stent and also for possible  relook at his left LAD stenosis.  Previously, he was known to have a 40% mid  LAD stenosis.   HEMODYNAMIC DATA:  The left ventricular pressure was 101/8, end diastolic  pressure 15 mmHg.  The aortic pressures were 94/61 with a mean of 77 mmHg.  There was no pressure gradient across the aortic valve.   ANGIOGRAPHIC DATA:  Left ventricle:  The left ventricular systolic function  was normal.  Ejection fraction was estimated at 55 to 60%.   Right coronary artery:  Right coronary artery is a large, single dominant  vessel.  The previously placed 2.74 x 16 mm TAXUS stent on April 02, 2004,  is widely patent.  There was catheter induced spasm  into the proximal RCA  which was relieved with nitroglycerin administration.   Left Main:  Left main is a large caliber vessel.  It is normal.   Circumflex:  Circumflex is a moderate to large caliber vessel.  It gives  origin to a large obtuse marginal branch, continues to OM2 after giving AV  groove branch.  It has mild luminal irregularity.   Left anterior descending artery:  Left anterior descending artery is a large  caliber vessel.  It gives origin to large diagonal I and continues at the  apex.  There is moderate amount of luminal irregularity after the origin of  the diagonal I and angiographically appears to be 40% stenosis at most.   IVUS DATA:  The IVUS of the LAD revealed a 61% luminal stenosis in the mid  LAD.  The LAD measured 3.0 mm and at the tightest site, the lumen diameter  was only 3.0 sq mm.   INTERVENTION DATA:  Successful PTCA and direct stenting of the mid  LAD with  3.0 x 23 mm CYPHER at 20 atmospheres pressure, giving a 3.25 mm lumen.  The  stenosis was reduced from 60% IVUS stenosis to 0%.  There was TIMI-3 to TIMI-  3 flow maintained at the end of the procedure.   POST INTERVENTIONAL IVUS DATA:  The post intervention IVUS data revealed  very good opposition of the stent to the arterial wall.  The lumen now  measured at the tightest spot at 6.1 sq mm.  There was no residual stenosis.  There was mild plaque noted at the distal end of the stent constituting 10  to 20% stenosis in the LAD.  There was no dissection evident.   RECOMMENDATIONS:  Patient will be continued on aspirin and Plavix for at  least a period of one year given the TAXUS stent in his RCA.  Aggressive  risk factor modification is indicated including smoking cessation.   TECHNIQUE OF CARDIAC CATHETERIZATION:  Under the usual sterile conditions,  using a 6 French right femoral artery access, 6 [place] multipurpose BP  catheter was advanced into the ascending aorta with 0.035 J-wire.  The   catheter was gently advanced into the left ventricle.  Left ventricular  pressure was monitored.  Hand contrast injection into the left ventricle was  performed both in the LAO and RAO projection.  The catheter was flushed with  saline and pulled back in the ascending aorta and pressure gradient across  the aortic valve was monitored.  Right coronary artery was selective engaged  and angiography was performed.  Then the catheter was pulled out of the body  in the usual fashion.  Then a 7 [place] FL4 guide was utilized to engage the  left main coronary artery and angiography was repeated.  Because of  inadequate engagement of the left main with an FL3.5, the guide was changed  to an FL4.0 7French guide.   TECHNIQUE OF INTERVENTION:  After giving 5000 units of heparin and loading  him with Integrilin, a 190 cm x 0.014 inch ATW guidewire was advanced into  the left anterior descending artery.  The lesion length was carefully  measured and it measured approximately 20 mm.  Then the tip of the wire was  carefully positioned in the distal LAD.  Then a Galaxy IVUS catheter was  advanced into the LAD and IVUS was performed of the mid LAD and the data was  carefully analyzed.  Then the IVUS catheter was pulled out of the body and  it was decided to proceed with stenting.  Hence, a 3.0 x 23 mm CYPHER stent  was advanced into the lesion site and after confirming the position of the  stent, the stent was deployed at a maximum of 20 atmospheres of pressure for  60 seconds and a second inflation at 20 atmospheres of pressure for 20  seconds was performed.  Balloon was deflated and 100 mcg of intracoronary  nitroglycerin was administered and angiography was repeated.  Excellent  results were noted.  The the stent balloon was withdrawn and the IVUS  catheter was reinserted back on to the guidewire and IVUS was performed at  the stent site.  Excellent position of the stent struts against the wall of the LAD  was noted.  Then the IVUS catheter along with the guidewire was  withdrawn out of the body and angiography was repeated.  Then the guide  catheter was disengaged and pulled out of the body in the usual fashion.  The patient  tolerated the procedure well.  No immediate complications were  noted.  A total of 175 mL was used for diagnostic and interventional  procedure.     JRG/MEDQ  D:  05/05/2004  T:  05/05/2004  Job:  [id number]   cc:   Antionette Char, MD  [address]  [place]  [place] [address]  Fax: [phone]

## 2010-07-23 NOTE — Progress Notes (Signed)
[place]                        PERIPHERAL VASCULAR OFFICE NOTE   NAME:Hudson, KIMBALL APPLEBY                       MRN:          [id number]  DATE:03/16/2006                            DOB:          February 12, 1956    Zephaniah Lubrano was seen back in followup at the [facility] clinic  on March 16, 2006. Mr. Creekmore is a 55 year old gentleman with coronary  artery disease and prior drug-eluting stent placement in both his LAD  and right coronary artery who presented today for a followup. He was  initially seen on December 12 where he appeared stable. He does have  class 2 angina. I increased his metoprolol from 12.5 to 25 mg twice a  day a the time of that visit. He has noted symptomatic improvement since  that time. In the past month, he has had 3 episodes of angina that  required him to take sublingual nitroglycerin. This has improved  previous. He continues to get chest pain when he becomes emotionally  upset or with significant physical exertion. He denies dyspnea,  orthopnea, PND, light-headedness, edema, or syncope.   CURRENT MEDICATIONS:  1. Metoprolol 25 mg twice daily.  2. Benazepril 5 mg daily.  3. Norvasc 10 mg daily.  4. Plavix 75 mg daily.  5. Crestor 10 mg daily.  6. Tramadol 50 mg as needed.  7. Zoloft 150 mg daily.   PHYSICAL EXAMINATION:  GENERAL:  The patient is alert and oriented, he  is in no acute distress.  VITAL SIGNS:  Weight is 222 pounds, blood pressure is 129/85, heart rate  66, respiratory rate 16.  HEENT:  Normal.  NECK:  Normal carotid upstrokes without bruits. Jugular venous pressure  is normal.  LUNGS:  Clear to auscultation bilaterally.  CARDIOVASCULAR:  Regular rate and rhythm without murmurs or gallops.  ABDOMEN:  Soft, nontender, no organomegaly.  EXTREMITIES:  No clubbing, cyanosis or edema. Peripheral pulses are 2+  and equal throughout.   EKG demonstrates normal sinus rhythm with a nonspecific ST segment  change present.   ASSESSMENT:  Mr. Clayburn remained stable from a cardiovascular standpoint  with regard to his coronary artery disease and stable angina. He has had  some improvement with increase of his beta blocker. I think he can  tolerate another increase from 25 mg of metoprolol twice daily up to 50  mg twice daily. I would recommend continuing his benazepril, Norvasc,  aspirin and Plavix. I  arranged a followup for 6 months but I would certainly be happy to see  him sooner if he develops any new problems.     Veverly Fells. Excell Seltzer, MD  Electronically Signed    MDC/MedQ  DD: 03/16/2006  DT: 03/16/2006  Job #: [id number]   cc:   [name]

## 2010-07-23 NOTE — Cardiovascular Report (Signed)
NAME:  [name] [name], [name] [name] NO.:  [id number]   MEDICAL RECORD NO.:  [id number]          PATIENT TYPE:  INP   LOCATION:  6529                         FACILITY:  [facility]   PHYSICIAN:  Antionette Char, MD    DATE OF BIRTH:  [birth year]   DATE OF PROCEDURE:  04/01/2004  DATE OF DISCHARGE:                              CARDIAC CATHETERIZATION   REFERRING PHYSICIAN:  Dr. Juleen China   PROCEDURES:  1.  Left heart catheterization.  2.  Coronary cineangiography.  3.  Left ventricular cineangiography.  4.  Left internal mammary artery cineangiography.  5.  Angio-Seal of the right femoral artery.   INDICATION FOR PROCEDURES:  This 55 year old male has a history of mild  coronary artery disease documented with a cardiac cath 14 years ago showing  mild stenosis.  He has done well clinically until recently he had the onset  of exertional angina and had a prolonged episode of chest pain and pressure.  When he presented to the hospital on March 30, 2004, his enzymes were  negative.  He then had a treadmill exercise tolerance test with Myoview  study yesterday in the hospital, and his treadmill portion was abnormal,  showing him to be at high risk.  He was then scheduled for a cardiac cath  today.  His Myoview study is still pending.   DESCRIPTION OF PROCEDURE:  After signing an informed consent, the patient  was premedicated with 5 mg of Valium by mouth and brought to the cardiac  catheterization lab.  His right groin was prepped and draped in a sterile  fashion, and his right groin was anesthetized locally with 1% lidocaine.  A  6 French introducer sheath was inserted percutaneously into the right  femoral artery.  Then 6 [place] #4 Judkins coronary catheters were used to  make injections into the native coronary arteries.  The right coronary  catheter was used to make a selective injection into the left internal  mammary artery.  A 6 French pigtail catheter was used to measure  pressures  in the left ventricle and aorta and to make midstream injections into the  left ventricle.  A pull-back across the aortic valve was recorded.  The  patient tolerated the procedure well, and no complications were noted at the  end of the procedure.  The catheter and sheath were removed from the right  femoral artery, and hemostasis was easily obtained with an Angio-Seal  closure system.  He was then returned to his room in satisfactory condition.   MEDICATIONS GIVEN:  None.   CINE FINDINGS:  1.  Coronary cineangiography left coronary artery:  The ostium and left main      appear normal.  2.  Left anterior descending:  There is a 30-40% stenotic lesion in the      middle segment.  This is an eccentric lesion and appears to be      essentially normal in the LAO view.  The remainder of the LAD appears      normal and has normal flow.  3.  Circumflex coronary artery:  The circumflex  is a large vessel and      appears normal.  4.  Right coronary artery:  The right coronary artery has a normal ostium.      There is a lesion in the proximal segment in the area of the right      ventricular branch.  This is an eccentric lesion and in one view appears      to be approximately 80% stenotic.  In the other view, it is      approximately 50-60%.  Therefore, appears to have an average stenosis of      70%.  The middle and distal segments appear normal, and there is normal      run-off.  There is good antegrade flow TIMI III.  5.  Left internal mammary artery appears normal.   LEFT VENTRICULAR CINEANGIOGRAM:  The left ventricular chamber size appears  normal.  The overall left ventricular contractility appears normal with an  ejection fraction of approximately 55%.  The mitral and aortic valves appear  normal.   FINAL DIAGNOSES:  1.  Single-vessel coronary artery disease with 70% proximal right coronary      artery lesion.  2.  Moderate stenosis mid left anterior descending.  3.   Normal circumflex.  4.  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  5.  Normal left ventricular function, ejection fraction 55%.  6.  Success Angio-Seal of the right femoral artery.   DISPOSITION:  We will get the results of the Myoview study prior to making  the further disposition.  He needs intervention of his right coronary  artery; however, it is unknown at present whether there are ischemic changes  in the distribution of his left anterior descending.  If there are ischemic  changes on the Myoview study, he may be a candidate for coronary artery  bypass graft surgery with the two-vessel disease versus angioplasty with  stent placement.      JRT/MEDQ  D:  04/01/2004  T:  04/01/2004  Job:  [id number]   cc:   Brooke Bonito, M.D.  [address]  [place]  [place] [address]  Fax: [phone]   Cath Lab [facility]
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
## 2010-07-23 NOTE — Discharge Summary (Signed)
NAME:  [name] [name], [name] [name] NO.:  [id number]   MEDICAL RECORD NO.:  [id number]          PATIENT TYPE:  INP   LOCATION:                               FACILITY:  [facility]   PHYSICIAN:  Brooke Bonito, M.D.      DATE OF BIRTH:  August 23, 1955   DATE OF ADMISSION:  03/30/2004  DATE OF DISCHARGE:  04/03/2004                                 DISCHARGE SUMMARY   PRIMARY CARE PHYSICIAN:  Dr. Juleen China.   CARDIOLOGIST:  Dr. Aleen Campi.   INTERVENTIONALIST:  Dr. Clarene Duke.   DISCHARGE DIAGNOSES:  1.  Unstable angina.  2.  Coronary artery disease.  3.  Hypertension.  4.  Anxiety/depression.  5.  Tobacco abuse.  6.  Chronic headaches.   DISCHARGE MEDICATIONS:  1.  Meclizine 100 mg p.o. q.h.s.  2.  Tranxene 7.5 mg p.o. q.i.d.  3.  Celexa 20 mg p.o. daily.  4.  Avapro 150 mg p.o. daily.  5.  Zanaflex 12 mg p.o. q.h.s.  6.  Metoprolol 12.5 mg p.o. b.i.d.  7.  Aspirin 325 mg p.o. b.i.d.  8.  Plavix 75 mg p.o. daily.   1.  Followup with Dr. Adelene Idler office as indicated on pink  sheet.  2.      Followup with regular care physician.  No acute followup needs      necessary.   HOSPITAL COURSE:  Problem #1. Please see H&P for full admission and details.  The patient came in with unstable angina.  Cardiology was consulted with  admission.  Please see drug consult note for details.  The patient underwent  cardiac catheterization showing significant RCA disease, approximately 75%  average stenosis.  Otherwise there is low degree of stenosis in the left  anterior descending, 30-40% in the middle segment.  The patient returned  back from cath lab with ongoing intermittent chest tightness lasting 5-10  minutes.  Cardiology reassessed and took the patient back for intervention  of his RCA.  The lesion was stented successfully with 0% stenosis after  intervention.  The patient, on the day of discharge and after procedure, had  significant improvement in symptoms.  No complaints of chest tightness.  Shortness of breath was relieved.   Problem #2. Coronary artery disease.  Continue his beta blocker, aspirin.  Maintain healthy lifestyle and exercise.  Try to avoid tobacco use as he has  stated he will at this point.   Problem #3.  Hypertension.  Continue meds.  I have added metoprolol low  dose.  This needs to be titrated as an outpatient as his heart rate  tolerates.   Problem #4. Anxiety/depression.  No change in his medications.  These are to  be resumed on discharge.   DISCHARGE LABS:  Sodium 134, potassium 3.6, BUN 12, creatinine 1.2.  White  count of 13, hemoglobin 12.2 (note increase in white count most likely  secondary to intervention procedure and inflammatory reaction from that.  He  has no other sources of identifiable sources of infection at this time.  No  antibiotics have been initiated on discharge).  ________________________________________  Gertha Calkin, M.D.  ___________________________________________  Brooke Bonito, M.D.    JD/MEDQ  D:  04/03/2004  T:  04/04/2004  Job:  [id number]   cc:   Antionette Char, MD  [address]  [place]  [place] [address]  Fax: [phone]   Brooke Bonito, M.D.  [address]  [place]  [place] [address]  Fax: [phone]

## 2010-07-23 NOTE — Cardiovascular Report (Signed)
NAME:  [name] [name], [name] [name] NO.:  [id number]   MEDICAL RECORD NO.:  [id number]          PATIENT TYPE:  INP   LOCATION:  6529                         FACILITY:  [facility]   PHYSICIAN:  Thereasa Solo. Little, M.D. DATE OF BIRTH:  1955-11-01   DATE OF PROCEDURE:  04/02/2004  DATE OF DISCHARGE:                              CARDIAC CATHETERIZATION   INDICATIONS FOR TEST:  this 55 year old male was admitted with anginal  complaints. A nuclear study failed to show ischemia. However, the EKG  portion showed ST-segment depression in inferior lateral leads. Cardiac  catheterization showed an eccentric hypolucent 75% area of narrowing in the  proximal RCA.   After obtaining informed consent, the patient was prepped and draped in the  usual sterile fashion exposing the left groin.  Following local anesthetic  with 1% Xylocaine the Seldinger technique was employed and a 6-French  introducer sheath was placed into the left femoral artery. A JL-4 guide  catheter was used. A short Luge wire was used. It was placed into the distal  RCA. A 2.75 x 16 TAXUS stent was made ready. It was positioned in such a  manner that both the proximal and distal portion of the lesion was well  covered. Distal to the high-grade stenosis was some minor irregularity and  because of this, a longer stent was used to make sure this entire region was  covered.   The first inflation was 13 atmospheres for 57 seconds. A second inflation at  16 atmospheres for 55 seconds. With this, the patient had duplication of his  pain he had been experiencing at home. When the procedure was over, he had  mild awareness of his chest.   Initially after the stent was placed, there appeared to be a rather  significant step-down at the distal portion of the stent. Once the stent  balloon was removed and intracoronary nitroglycerin was given, this  resolved. I waited five additional minutes, relooked at this area, it  appeared to be  normal.  I saw no evidence of any dissection or thrombus.   The patient was given 4500 units of intravenous heparin. He was given double  bolus Integrilin and the Integrilin will be continued for 12 additional  hours. In addition to this, he received 300 mg of oral Plavix   His sheath will be removed later today. He should be ready for discharge in  the morning.   During the procedure his blood pressure was 124/88.   TOTAL CONTRAST USED:  75 cc.   COMPLICATIONS:  None.      ABL/MEDQ  D:  04/02/2004  T:  04/02/2004  Job:  [id number]   cc:   Brooke Bonito, M.D.  [address]  [place]  [place] [address]  Fax: [phone]   Antionette Char, MD  [address]  [place]  [place] [address]  Fax: [phone]   Cath Lab

## 2010-12-17 LAB — CBC
HCT: 38.7 — ABNORMAL LOW
HCT: 38.8 — ABNORMAL LOW
Hemoglobin: 13.2
Hemoglobin: 13.4
MCHC: 34.6
MCV: 90
MCV: 90.2
RBC: 4.29
RBC: 4.31
RDW: 12.5
WBC: 10.9 — ABNORMAL HIGH

## 2010-12-17 LAB — COMPREHENSIVE METABOLIC PANEL
ALT: 17
AST: 19
AST: 29
Albumin: 3.9
Alkaline Phosphatase: 58
CO2: 23
Chloride: 99
Creatinine, Ser: 1.34
GFR calc Af Amer: 49 — ABNORMAL LOW
GFR calc non Af Amer: 41 — ABNORMAL LOW
Sodium: 131 — ABNORMAL LOW
Sodium: 136
Total Bilirubin: 1.2
Total Bilirubin: 1.3 — ABNORMAL HIGH
Total Protein: 6.9

## 2010-12-17 LAB — CULTURE, RESPIRATORY W GRAM STAIN

## 2010-12-17 LAB — POCT I-STAT 3, ART BLOOD GAS (G3+)
O2 Saturation: 89
TCO2: 27
pCO2 arterial: 49.2 — ABNORMAL HIGH
pH, Arterial: 7.314 — ABNORMAL LOW
pO2, Arterial: 62 — ABNORMAL LOW

## 2010-12-17 LAB — CARDIAC PANEL(CRET KIN+CKTOT+MB+TROPI)
CK, MB: 5.1 — ABNORMAL HIGH
Relative Index: 1.2
Relative Index: 1.3
Troponin I: 0.01

## 2010-12-17 LAB — EXPECTORATED SPUTUM ASSESSMENT W GRAM STAIN, RFLX TO RESP C

## 2010-12-17 LAB — DIFFERENTIAL
Basophils Absolute: 0
Eosinophils Absolute: 0.2
Eosinophils Relative: 2
Lymphs Abs: 1.8
Monocytes Absolute: 1 — ABNORMAL HIGH

## 2010-12-17 LAB — TROPONIN I: Troponin I: 0.01

## 2010-12-17 LAB — LIPID PANEL
Cholesterol: 113
LDL Cholesterol: 70

## 2010-12-17 LAB — I-STAT 8, (EC8 V) (CONVERTED LAB)
BUN: 11
Bicarbonate: 24.6 — ABNORMAL HIGH
Glucose, Bld: 96
Operator id: 146091
pCO2, Ven: 43.8 — ABNORMAL LOW

## 2010-12-17 LAB — TSH: TSH: 1.482

## 2010-12-17 LAB — HEMOGLOBIN A1C: Hgb A1c MFr Bld: 5.7

## 2012-09-26 ENCOUNTER — Encounter: Payer: Self-pay | Admitting: Cardiology

## 2012-09-26 ENCOUNTER — Ambulatory Visit (INDEPENDENT_AMBULATORY_CARE_PROVIDER_SITE_OTHER): Payer: Medicare Other | Admitting: Cardiology

## 2012-09-26 VITALS — BP 136/80 | HR 85 | Ht 69.0 in | Wt 249.0 lb

## 2012-09-26 DIAGNOSIS — I2510 Atherosclerotic heart disease of native coronary artery without angina pectoris: Secondary | ICD-10-CM

## 2012-09-26 DIAGNOSIS — Z9861 Coronary angioplasty status: Secondary | ICD-10-CM

## 2012-09-26 DIAGNOSIS — R079 Chest pain, unspecified: Secondary | ICD-10-CM

## 2012-09-26 DIAGNOSIS — I10 Essential (primary) hypertension: Secondary | ICD-10-CM

## 2012-09-26 DIAGNOSIS — Z720 Tobacco use: Secondary | ICD-10-CM

## 2012-09-26 DIAGNOSIS — E785 Hyperlipidemia, unspecified: Secondary | ICD-10-CM

## 2012-09-26 DIAGNOSIS — Z7189 Other specified counseling: Secondary | ICD-10-CM

## 2012-09-26 DIAGNOSIS — E669 Obesity, unspecified: Secondary | ICD-10-CM

## 2012-09-26 DIAGNOSIS — Z716 Tobacco abuse counseling: Secondary | ICD-10-CM

## 2012-09-26 DIAGNOSIS — F17200 Nicotine dependence, unspecified, uncomplicated: Secondary | ICD-10-CM

## 2012-09-26 MED ORDER — ISOSORBIDE MONONITRATE ER 60 MG PO TB24: 60.0000 mg | ORAL_TABLET | Freq: Every day | ORAL | Status: DC

## 2012-09-26 MED ORDER — ASPIRIN EC 81 MG PO TBEC: 81.0000 mg | DELAYED_RELEASE_TABLET | Freq: Every day | ORAL | Status: DC

## 2012-09-26 NOTE — Patient Instructions (Addendum)
I am concerned about your symptoms -- I would like to do a stress test - We will contact you about scheduling this for early next week.  Your physician has requested that you have a lexiscan myoview. For further information please visit [email or web address] Please follow instruction sheet, as given.  I also want you to start Aspirin 81 mg & increase your Isosorbide Mononitrate to 60 mg (2 tabs).   I will see you after there stress test.  Marykay Lex, MD

## 2012-09-30 ENCOUNTER — Encounter: Payer: Self-pay | Admitting: Cardiology

## 2012-09-30 DIAGNOSIS — E785 Hyperlipidemia, unspecified: Secondary | ICD-10-CM | POA: Insufficient documentation

## 2012-09-30 DIAGNOSIS — Z720 Tobacco use: Secondary | ICD-10-CM | POA: Insufficient documentation

## 2012-09-30 DIAGNOSIS — R079 Chest pain, unspecified: Secondary | ICD-10-CM | POA: Insufficient documentation

## 2012-09-30 DIAGNOSIS — Z716 Tobacco abuse counseling: Secondary | ICD-10-CM | POA: Insufficient documentation

## 2012-09-30 DIAGNOSIS — I2510 Atherosclerotic heart disease of native coronary artery without angina pectoris: Secondary | ICD-10-CM | POA: Insufficient documentation

## 2012-09-30 DIAGNOSIS — I10 Essential (primary) hypertension: Secondary | ICD-10-CM | POA: Insufficient documentation

## 2012-09-30 DIAGNOSIS — Z9861 Coronary angioplasty status: Secondary | ICD-10-CM | POA: Insufficient documentation

## 2012-09-30 NOTE — Assessment & Plan Note (Addendum)
Pending the results of his stress tests, which I do not think he will be able to r walk on the treadmill due to his osteoarthritis pains, he definitely did take charge of a bit in his dietary intake and hydration.  His exercise.  Her sugar some insurance representative due to being a long-term smoker.  He denies any symptoms that would suggest OSA.

## 2012-09-30 NOTE — Assessment & Plan Note (Signed)
He has been lost to followup for cardiologist ever since 2011.  As best I can tell.  He is still taking Plavix, but no aspirin.  He is taking a statin, and nitrate.  Her blood pressures on an ACE inhibitor.   He is on beta blocker, and I'm not sure if this is due to probable COPD.  Especially having chest pain, I want to add aspirin, nitroglycerin, Plavix.  I would increase his Imdur dose to 60 mg.  We will see how he does on a stress test.

## 2012-09-30 NOTE — Assessment & Plan Note (Signed)
I spent about 5-6 minutes directly, the importance of cessation, stating that if he does warrant continued come back again reminded.  He will cautiously continue his efforts to quit, and not return to the habit.

## 2012-09-30 NOTE — Assessment & Plan Note (Signed)
Multis, his blood pressure actually appears to be relatively well controlled.  His are at 64, which would allow for [place] to use some type of beta blocker if need be.  We'll see how he fares on stress test.
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
## 2012-09-30 NOTE — Assessment & Plan Note (Signed)
On statin.  Last labs were not that bad with the exception of HDL being just on the low side.

## 2012-09-30 NOTE — Assessment & Plan Note (Addendum)
I spent about 10 minutes talking to the patient about my concerns of his symptoms may very well be consistent with worsening anginal symptoms.  He cannot recall whether this symptom is similar to what he came in with when his LAD and RCA were stented.  He does note that he concerned that is what it is.  However, he sees a more concerned with taking care of his 57 year old mother and niece than himself. Simply based on his obesity and hypertension along with dyslipidemia and smoking, he has metabolic syndrome criteria, and known coronary disease.  We need to take his symptoms seriously and evaluate them properly.  I gave them the option of actually of proceeding directly to cardiac catheterization versus noninvasive testing.  Christell Constant, do to his concerns of how his mother to be cared for than anything else.  He opted for noninvasive evaluation.  Plan: LexiScan Myoview, increase Imdur to 60 mg, restart aspirin 81 mg.

## 2012-09-30 NOTE — Assessment & Plan Note (Signed)
Roughly 4-5 minutes spent with discussing the importance of smoking cessation and options to quit.  The

## 2012-10-04 ENCOUNTER — Ambulatory Visit (HOSPITAL_COMMUNITY)
Admission: RE | Admit: 2012-10-04 | Discharge: 2012-10-04 | Disposition: A | Discharge status: Home / Self Care | Payer: Medicare Other | Source: Ambulatory Visit | Attending: Cardiology | Admitting: Cardiology

## 2012-10-04 ENCOUNTER — Encounter: Payer: Self-pay | Admitting: *Deleted

## 2012-10-04 ENCOUNTER — Encounter: Payer: Self-pay | Admitting: Cardiovascular Disease

## 2012-10-04 DIAGNOSIS — R42 Dizziness and giddiness: Secondary | ICD-10-CM | POA: Insufficient documentation

## 2012-10-04 DIAGNOSIS — I10 Essential (primary) hypertension: Secondary | ICD-10-CM | POA: Insufficient documentation

## 2012-10-04 DIAGNOSIS — R5381 Other malaise: Secondary | ICD-10-CM | POA: Insufficient documentation

## 2012-10-04 DIAGNOSIS — F17200 Nicotine dependence, unspecified, uncomplicated: Secondary | ICD-10-CM | POA: Insufficient documentation

## 2012-10-04 DIAGNOSIS — R0609 Other forms of dyspnea: Secondary | ICD-10-CM | POA: Insufficient documentation

## 2012-10-04 DIAGNOSIS — R079 Chest pain, unspecified: Secondary | ICD-10-CM

## 2012-10-04 DIAGNOSIS — Z9861 Coronary angioplasty status: Secondary | ICD-10-CM | POA: Insufficient documentation

## 2012-10-04 DIAGNOSIS — R002 Palpitations: Secondary | ICD-10-CM | POA: Insufficient documentation

## 2012-10-04 DIAGNOSIS — Z8249 Family history of ischemic heart disease and other diseases of the circulatory system: Secondary | ICD-10-CM | POA: Insufficient documentation

## 2012-10-04 DIAGNOSIS — E669 Obesity, unspecified: Secondary | ICD-10-CM | POA: Insufficient documentation

## 2012-10-04 DIAGNOSIS — J449 Chronic obstructive pulmonary disease, unspecified: Secondary | ICD-10-CM | POA: Insufficient documentation

## 2012-10-04 DIAGNOSIS — J4489 Other specified chronic obstructive pulmonary disease: Secondary | ICD-10-CM | POA: Insufficient documentation

## 2012-10-04 DIAGNOSIS — R0989 Other specified symptoms and signs involving the circulatory and respiratory systems: Secondary | ICD-10-CM | POA: Insufficient documentation

## 2012-10-04 DIAGNOSIS — I2510 Atherosclerotic heart disease of native coronary artery without angina pectoris: Secondary | ICD-10-CM | POA: Insufficient documentation

## 2012-10-04 MED ORDER — TECHNETIUM TC 99M SESTAMIBI GENERIC - CARDIOLITE
10.0000 | Freq: Once | INTRAVENOUS | Status: AC | PRN
  Administered 2012-10-04: 10 via INTRAVENOUS

## 2012-10-04 MED ORDER — AMINOPHYLLINE 25 MG/ML IV SOLN
125.0000 mg | Freq: Once | INTRAVENOUS | Status: AC
  Administered 2012-10-04: 125 mg via INTRAVENOUS

## 2012-10-04 MED ORDER — TECHNETIUM TC 99M SESTAMIBI GENERIC - CARDIOLITE
30.0000 | Freq: Once | INTRAVENOUS | Status: AC | PRN
  Administered 2012-10-04: 30 via INTRAVENOUS

## 2012-10-04 MED ORDER — REGADENOSON 0.4 MG/5ML IV SOLN
0.4000 mg | Freq: Once | INTRAVENOUS | Status: AC
  Administered 2012-10-04: 0.4 mg via INTRAVENOUS

## 2012-10-04 NOTE — Procedures (Addendum)
[facility] [address] [place] [address] [phone]  Cardiology Nuclear Med Study  EARLEY GROBE is a 57 y.o. male     MRN : [id number]     DOB: [birth year]  Procedure Date: 10/04/2012  Nuclear Med Background Indication for Stress Test:  Stent Patency History:  COPD and CAD;STENT/PTCA--2008 Cardiac Risk Factors: Family History - CAD, Hypertension, Lipids, Obesity and Smoker  Symptoms:  Chest Pain, Dizziness, DOE, Fatigue, Light-Headedness, Palpitations and SOB   Nuclear Pre-Procedure Caffeine/Decaff Intake:  10:00pm NPO After: 8:00am   IV Site: R Hand  IV 0.9% NS with Angio Cath:  22g  Chest Size (in):  44"  IV Started by: Emmit Pomfret, RN  Height: 5\' 9"  (1.753 m)  Cup Size: n/a  BMI:  Body mass index is 36.75 kg/(m^2). Weight:  249 lb (112.946 kg)   Tech Comments:  N/A    Nuclear Med Study 1 or 2 day study: 1 day  Stress Test Type:  Lexiscan  Order Authorizing Provider:  Bryan Lemma, MD   Resting Radionuclide: Technetium 55m Sestamibi  Resting Radionuclide Dose: 10.2 mCi   Stress Radionuclide:  Technetium 16m Sestamibi  Stress Radionuclide Dose: 31.2 mCi           Stress Protocol Rest HR: 67 Stress HR: 85  Rest BP: 141/88 Stress BP: 142/79  Exercise Time (min): n/a METS: n/a   Predicted Max HR: 164 bpm % Max HR: 51.83 bpm Rate Pressure Product: 21308  Dose of Adenosine (mg):  n/a Dose of Lexiscan: 0.4 mg  Dose of Atropine (mg): n/a Dose of Dobutamine: n/a mcg/kg/min (at max HR)  Stress Test Technologist: Esperanza Sheets, CCT Nuclear Technologist: Koren Shiver, CNMT   Rest Procedure:  Myocardial perfusion imaging was performed at rest 45 minutes following the intravenous administration of Technetium 46m Sestamibi. Stress Procedure:  The patient received IV Lexiscan 0.4 mg over 15-seconds.  Technetium 45m Sestamibi injected at 30-seconds. The Patient experienced SOB and Dizziness; 75 mg IV  Aminophylline was administered with resolution of symptoms. There were no significant changes with Lexiscan.  Quantitative spect images were obtained after a 45 minute delay.  Transient Ischemic Dilatation (Normal <1.22):  0.96 Lung/Heart Ratio (Normal <0.45):  0.44 QGS EDV:  97 ml QGS ESV:  40 ml LV Ejection Fraction: 59%  Signed by      Rest ECG: NSR - Normal EKG  Stress ECG: No significant change from baseline ECG  QPS Raw Data Images:  Normal; no motion artifact; normal heart/lung ratio. Stress Images:  Normal homogeneous uptake in all areas of the myocardium. Rest Images:  Normal homogeneous uptake in all areas of the myocardium. Subtraction (SDS):  No evidence of ischemia.  Impression Exercise Capacity:  Lexiscan with no exercise. BP Response:  Normal blood pressure response. Clinical Symptoms:  No significant symptoms noted. ECG Impression:  No significant ST segment change suggestive of ischemia. Comparison with Prior Nuclear Study: No significant change from previous study  Overall Impression:  Normal stress nuclear study.  LV Wall Motion:  NL LV Function; NL Wall Motion   Runell Gess, MD  10/04/2012 6:29 PM

## 2012-10-08 ENCOUNTER — Ambulatory Visit: Payer: Self-pay | Admitting: Cardiovascular Disease

## 2012-10-09 ENCOUNTER — Telehealth: Payer: Self-pay | Admitting: Cardiology

## 2012-10-09 NOTE — Telephone Encounter (Signed)
Returned call and spoke w/ pt.  Informed results have not been reviewed by MD/PA and nurse will call after they are reviewed.  Pt verbalized understanding and agreed w/ plan.   

## 2012-10-09 NOTE — Telephone Encounter (Signed)
Wants to know if his Stress Test results are back?

## 2012-10-10 ENCOUNTER — Telehealth: Payer: Self-pay | Admitting: *Deleted

## 2012-10-10 NOTE — Telephone Encounter (Signed)
Spoke to patient. Myoview results given. Follow up appointment given 10/30/12 at 3:30

## 2012-10-10 NOTE — Telephone Encounter (Signed)
Message copied by Tobin Chad on Wed Oct 10, 2012 10:20 AM ------      Message from: [facility], DAVID      Created: Thu Oct 04, 2012  9:42 PM       Good news -- no evidence of significant large artery CAD blockages. Normal function.            This does not mean that his CP is not related to CAD - could be small vessel disease.  We can discuss this in follow-up.      Marykay Lex, MD       ------

## 2012-10-30 ENCOUNTER — Encounter: Payer: Self-pay | Admitting: Cardiology

## 2012-10-30 ENCOUNTER — Ambulatory Visit (INDEPENDENT_AMBULATORY_CARE_PROVIDER_SITE_OTHER): Payer: Medicare Other | Admitting: Cardiology

## 2012-10-30 VITALS — BP 130/90 | HR 84 | Ht 69.0 in | Wt 250.5 lb

## 2012-10-30 DIAGNOSIS — Z9861 Coronary angioplasty status: Secondary | ICD-10-CM

## 2012-10-30 DIAGNOSIS — F17200 Nicotine dependence, unspecified, uncomplicated: Secondary | ICD-10-CM

## 2012-10-30 DIAGNOSIS — I2510 Atherosclerotic heart disease of native coronary artery without angina pectoris: Secondary | ICD-10-CM

## 2012-10-30 DIAGNOSIS — I10 Essential (primary) hypertension: Secondary | ICD-10-CM

## 2012-10-30 DIAGNOSIS — R079 Chest pain, unspecified: Secondary | ICD-10-CM

## 2012-10-30 DIAGNOSIS — E785 Hyperlipidemia, unspecified: Secondary | ICD-10-CM

## 2012-10-30 DIAGNOSIS — Z720 Tobacco use: Secondary | ICD-10-CM

## 2012-10-30 DIAGNOSIS — E669 Obesity, unspecified: Secondary | ICD-10-CM

## 2012-10-30 NOTE — Patient Instructions (Addendum)
Good news on the stress test.   Glad to hear your chest pains & breathing have gotten better.  Congratulations on quitting smoking.  I am going to leave your medications as they are. I will have you follow-up with one of my PAs or NP in 6 months & I will see you in 1 yr.  Marykay Lex, MD

## 2012-11-16 ENCOUNTER — Encounter: Payer: Self-pay | Admitting: Cardiology

## 2012-11-16 NOTE — Assessment & Plan Note (Signed)
I again encouraged him to take charge of his life with dietary modification standard dehydrated and excising. I am worried a little bit he may possibly in the smoking cessation phase. I encouraged him to not get discouraged if this does happen.

## 2012-11-16 NOTE — Assessment & Plan Note (Signed)
Fairly well-controlled on his diastolic pressure is up a little bit. We can reassess this on return visits. Could consider either increasing the ACE inhibitor or adding an beta-1 selective beta blocker.

## 2012-11-16 NOTE — Assessment & Plan Note (Signed)
I congratulated him on his efforts at having to quit smoking. I continued to encourage him my the explained the [place] Quit -Line and other resources.
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
## 2012-11-16 NOTE — Assessment & Plan Note (Signed)
Negative Myoview, and no active symptoms. I suspect that this was probably related to his respiratory illness and not cardiac in nature.

## 2012-11-16 NOTE — Assessment & Plan Note (Signed)
On statin with a relatively well-controlled lipids. The only thing is the HDL being low side. It recommended he continue to exercise as he started breathing better being off cigarettes.

## 2012-11-16 NOTE — Assessment & Plan Note (Addendum)
He continues to take dual antiplatelet therapy with aspirin and Plavix. I increased his Imdur dose at last visit. And he is not having any more chest pain pain. He is taking an ACE inhibitor as blood pressure stable. He is not on a beta blocker, presumptively due to COPD.

## 2012-12-03 ENCOUNTER — Telehealth: Payer: Self-pay | Admitting: Cardiology

## 2012-12-03 NOTE — Telephone Encounter (Signed)
Please call question about how is suppose to be taking his Isosorbide please.

## 2012-12-03 NOTE — Telephone Encounter (Signed)
Returned call.  Pt wanted to know what dose of isosorbide he should be taking.  Pt informed he should be taking 60mg  once daily.  Pt verbalized understanding and agreed w/ plan.

## 2013-03-07 DIAGNOSIS — J189 Pneumonia, unspecified organism: Secondary | ICD-10-CM

## 2013-03-07 HISTORY — DX: Pneumonia, unspecified organism: J18.9

## 2013-06-10 ENCOUNTER — Ambulatory Visit (HOSPITAL_COMMUNITY)
Admission: RE | Admit: 2013-06-10 | Discharge: 2013-06-10 | Disposition: A | Discharge status: Home / Self Care | Payer: Medicare HMO | Source: Ambulatory Visit | Attending: Cardiology | Admitting: Cardiology

## 2013-06-10 ENCOUNTER — Ambulatory Visit (INDEPENDENT_AMBULATORY_CARE_PROVIDER_SITE_OTHER): Payer: Medicare HMO | Admitting: Cardiology

## 2013-06-10 ENCOUNTER — Encounter: Payer: Self-pay | Admitting: Cardiology

## 2013-06-10 VITALS — BP 116/70 | Ht 69.0 in | Wt 266.3 lb

## 2013-06-10 DIAGNOSIS — I509 Heart failure, unspecified: Secondary | ICD-10-CM

## 2013-06-10 DIAGNOSIS — R609 Edema, unspecified: Secondary | ICD-10-CM

## 2013-06-10 DIAGNOSIS — R600 Localized edema: Secondary | ICD-10-CM

## 2013-06-10 DIAGNOSIS — M7989 Other specified soft tissue disorders: Secondary | ICD-10-CM | POA: Insufficient documentation

## 2013-06-10 MED ORDER — FUROSEMIDE 40 MG PO TABS
40.0000 mg | ORAL_TABLET | Freq: Two times a day (BID) | ORAL | Status: DC
Start: 2013-06-10 — End: 2013-10-14

## 2013-06-10 NOTE — Patient Instructions (Signed)
Your physician recommends that you schedule a follow-up appointment in:  0ne week to see either Dr. Ellyn Hack or the [place] simmons  Take your Furosemide 40 mg two times a day  Have your lab work done today   We are going to order a 2-D-Echo

## 2013-06-10 NOTE — Progress Notes (Signed)
Venous Duplex Lower Ext. Completed. Negative DVT in both legs. Oda Cogan, BS, RDMS, RVT

## 2013-06-11 LAB — BRAIN NATRIURETIC PEPTIDE: Brain Natriuretic Peptide: 13.8 pg/mL (ref 0.0–100.0)

## 2013-06-12 NOTE — Progress Notes (Signed)
Patient ID: [name] [name], male   DOB: 02/16/1956, 58 y.o.   MRN: [id number]    06/13/2013 [name] [name]   14-Apr-1955  [id number]  Primary Physicia [name] Bolt, MD Primary Cardiologist: Dr. Ellyn [name]  HPI:  The patient is a 58 y/o obsese WM, formerly followed by Dr. Rex [name], now followed by Dr. Ellyn [name] with a history of 2 vessel CAD and PCI to the RCA and LAD as well as a long time smoking history. He had a Myoview in August 2014 which was low risk for ischemia. LV function was noted to be normal, w/o WMA. His EF was estimated at 59%.   He presents to clinic with a complaint of bilateral LEE, R>L. This first started ~2 weeks ago and has progressively worsened. He denies any significant SOB, chest pain or weight gain any where else. No orthopnea or PND. He notes discomfort in both legs, stating that it feels as if they are going to "burst". He denies any recent prolonged travel, recent orthopedic surgeries, trauma or injuries. He is note very active and lives a sedentary lifestyle. He denies any prior history of blood clots.     Current Outpatient Prescriptions  Medication Sig Dispense Refill  . ALPRAZolam (XANAX) 0.5 MG tablet Take 0.5 mg by mouth 3 (three) times daily.      [name] aspirin EC 81 MG tablet Take 1 tablet (81 mg total) by mouth daily.  90 tablet  3  . betamethasone valerate (VALISONE) 0.1 % cream Apply topically as needed.      . celecoxib (CELEBREX) 200 MG capsule Take 200 mg by mouth daily.       . Cholecalciferol (VITAMIN D-3) 1000 UNITS CAPS Take by mouth.      . clopidogrel (PLAVIX) 75 MG tablet Take 75 mg by mouth daily.      . Fluticasone-Salmeterol (ADVAIR) 100-50 MCG/DOSE AEPB Inhale 1 puff into the lungs every 12 (twelve) hours.      . furosemide (LASIX) 40 MG tablet Take 40 mg by mouth daily as needed.      . hydrOXYzine (VISTARIL) 50 MG capsule Take 50 mg by mouth 4 (four) times daily as needed for itching.      . isosorbide mononitrate (IMDUR) 60 MG 24 hr tablet  Take 60 mg by mouth daily.      [name] lisinopril (PRINIVIL,ZESTRIL) 10 MG tablet Take 10 mg by mouth daily.      [name] morphine (MSIR) 30 MG tablet Take 30 mg by mouth every 4 (four) hours as needed for pain.      . rosuvastatin (CRESTOR) 20 MG tablet Take 20 mg by mouth daily.      . furosemide (LASIX) 40 MG tablet Take 1 tablet (40 mg total) by mouth 2 (two) times daily.  180 tablet  3   No current facility-administered medications for this visit.    Allergies  Allergen Reactions  . Iohexol      Code: HIVES, Desc: PT STATES HE HAD AN IVP 15 YRS AGO AND HAD SOB AND BROKE OUT IN HIVES., Onset Date: [id number]     History   Social History  . Marital Status: Single    Spouse Name: N/A    Number of Children: N/A  . Years of Education: N/A   Occupational History  . Not on file.   Social History Main Topics  . Smoking status: Former Smoker -- 1.00 packs/day for 44 years    Types: Cigarettes    Quit date: 10/25/2012  .  Smokeless tobacco: Not on file  . Alcohol Use: No  . Drug Use: No  . Sexual Activity: Not on file   Other Topics Concern  . Not on file   Social History Narrative   Retired.  Single.  No children.  Retired.  He formally worked Development worker, community.  He is a former heavy drinker, but stopped in 1995.  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 He quit on August 21, and is not felt an interest to smoked since.   He is a primary caregiver for his 37 year old mother.  He also has responsibilities of his to his niece, back and forth from work.       Review of Systems: General: negative for chills, fever, night sweats or weight changes.  Cardiovascular: negative for chest pain, dyspnea on exertion, edema, orthopnea, palpitations, paroxysmal nocturnal dyspnea or shortness of breath Dermatological: negative for rash Respiratory: negative for cough or wheezing Urologic: negative for hematuria Abdominal:  negative for nausea, vomiting, diarrhea, bright red blood per rectum, melena, or hematemesis Neurologic: negative for visual changes, syncope, or dizziness All other systems reviewed and are otherwise negative except as noted above.    Blood pressure 116/70, height 5\' 9"  (1.753 m), weight 266 lb 4.8 oz (120.793 kg).  General appearance: alert, cooperative, no distress and morbidly obese Neck: no carotid bruit and no JVD Lungs: clear to auscultation bilaterally Heart: regular rate and rhythm, S1, S2 normal, no murmur, click, rub or gallop Extremities: bilateral tense LEE R>L Pulses: 2+ and symmetric Skin: ertythmea noted on the anterior suface of the RLE Neurologic: Grossly normal  EKG NSR; 87 bpm  ASSESSMENT AND PLAN:   Bilateral lower extremity edema Based on the appearance of his legs, I had concern for possible DVT. I ordered for bilateral venous dopplers to be performed in clinic. There was no evidence of DVT. Based on lack of findings, other potential etiology is CHF. Will order a BNP and 2D echo. I have instructed for patient to increase his lasix dose to BID until he follows up. I have also ordered a BMP to assess renal function. Pt was instructed to follow up in 7-10 days for reassessment, after 2D echo is performed.     PLAN  With no evidence of DVT, will pursue w/u for CHF. Increase Lasix for edema. BNP and BMP. 2D echo and f/u in 7-10 days.   [name] Todisco SimmonsPA-C 06/13/2013 5:00 PM

## 2013-06-13 DIAGNOSIS — R600 Localized edema: Secondary | ICD-10-CM | POA: Insufficient documentation

## 2013-06-13 NOTE — Assessment & Plan Note (Signed)
Based on the appearance of his legs, I had concern for possible DVT. I ordered for bilateral venous dopplers to be performed in clinic. There was no evidence of DVT. Based on lack of findings, other potential etiology is CHF. Will order a BNP and 2D echo. I have instructed for patient to increase his lasix dose to BID Until he follows up. I have also ordered a BMP to assess renal function. Pt was instructed to follow up in 7-10 days for reassessment, after 2D echo is performed.

## 2013-06-18 ENCOUNTER — Ambulatory Visit (HOSPITAL_COMMUNITY)
Admission: RE | Admit: 2013-06-18 | Discharge: 2013-06-18 | Disposition: A | Discharge status: Home / Self Care | Payer: Medicare HMO | Source: Ambulatory Visit | Attending: Cardiovascular Disease | Admitting: Cardiovascular Disease

## 2013-06-18 ENCOUNTER — Ambulatory Visit (INDEPENDENT_AMBULATORY_CARE_PROVIDER_SITE_OTHER): Payer: Medicare HMO | Admitting: Cardiology

## 2013-06-18 ENCOUNTER — Encounter: Payer: Self-pay | Admitting: Cardiology

## 2013-06-18 VITALS — BP 100/60 | HR 88 | Ht 69.0 in | Wt 266.0 lb

## 2013-06-18 DIAGNOSIS — R600 Localized edema: Secondary | ICD-10-CM

## 2013-06-18 DIAGNOSIS — R609 Edema, unspecified: Secondary | ICD-10-CM | POA: Insufficient documentation

## 2013-06-18 DIAGNOSIS — I519 Heart disease, unspecified: Secondary | ICD-10-CM

## 2013-06-18 DIAGNOSIS — I509 Heart failure, unspecified: Secondary | ICD-10-CM | POA: Insufficient documentation

## 2013-06-18 NOTE — Progress Notes (Signed)
Patient ID: [name] [name], male   DOB: 12-May-1955, 58 y.o.   MRN: [id number]  Patient ID: [name] [name], male   DOB: [birth year], 58 y.o.   MRN: [id number]    06/18/2013 [name] [name]   1955/10/20  [id number]  Primary Physicia [name] Bolt, MD Primary Cardiologist: [name] [name]  [name] [name] returns to clinic today for 1 week follow-up for bilateral LEE. Details regarding his history and initial assessment on 06/10/13 are outlined below.   HPI:  The patient is a 58 y/o obsese WM, formerly followed by Dr. Rex [name], now followed by [name] [name] with a history of 2 vessel CAD and PCI to the RCA and LAD as well as a long time smoking history. He had a Myoview in August 2014 which was low risk for ischemia. LV function was noted to be normal, w/o WMA. His EF was estimated at 59%.   He presented to clinic a week ago with a complaint of bilateral LEE, R>L. This first started ~2 weeks prior and had progressively worsened. He denied any significant SOB, chest pain or weight gain any where else. No orthopnea or PND. He noted discomfort in both legs, stating that it felt as if they were going to "burst". He denied any recent prolonged travel, recent orthopedic surgeries, trauma or injuries. He is not very active and lives a sedentary lifestyle. He denied any prior history of blood clots.   Based on the appearance of his legs, I had concern for possible DVT. I ordered for bilateral venous dopplers to be performed in clinic. There was no evidence of DVT. Based on lack of findings, I felt other potential etiology was CHF. I ordered a BNP and 2D echo and instructed him to increase his lasix dose to BID until follow up. I also ordered a BMP to assess renal function.  He presents back today for reassessment. BNP returned normal at 37.6, making the diagnosis of CHF less likely. His  2D echo was performed earlier this am (results pending). His swelling has improved but he continues to note bilateral leg pain and  weakness. His BMP suggested hypokalemia w/ a K of 3.2 and his PCP, Dr. Wilson [name], increased his daily potassium dose.     Current Outpatient Prescriptions  Medication Sig Dispense Refill  . ALPRAZolam (XANAX) 0.5 MG tablet Take 0.5 mg by mouth 3 (three) times daily.      [name] aspirin EC 81 MG tablet Take 1 tablet (81 mg total) by mouth daily.  90 tablet  3  . betamethasone valerate (VALISONE) 0.1 % cream Apply topically as needed.      . celecoxib (CELEBREX) 200 MG capsule Take 200 mg by mouth daily.       . Cholecalciferol (VITAMIN D-3) 1000 UNITS CAPS Take by mouth.      . clopidogrel (PLAVIX) 75 MG tablet Take 75 mg by mouth daily.      . Fluticasone-Salmeterol (ADVAIR) 100-50 MCG/DOSE AEPB Inhale 1 puff into the lungs every 12 (twelve) hours.      . furosemide (LASIX) 40 MG tablet Take 40 mg by mouth daily as needed.      . furosemide (LASIX) 40 MG tablet Take 1 tablet (40 mg total) by mouth 2 (two) times daily.  180 tablet  3  . hydrOXYzine (VISTARIL) 50 MG capsule Take 50 mg by mouth 4 (four) times daily as needed for itching.      . isosorbide mononitrate (IMDUR) 60 MG 24 hr tablet Take 60 mg  by mouth daily.      [name] lisinopril (PRINIVIL,ZESTRIL) 10 MG tablet Take 10 mg by mouth daily.      [name] morphine (MSIR) 30 MG tablet Take 30 mg by mouth every 4 (four) hours as needed for pain.      . potassium chloride SA (K-DUR,KLOR-CON) 20 MEQ tablet       . rosuvastatin (CRESTOR) 20 MG tablet Take 20 mg by mouth daily.       No current facility-administered medications for this visit.    Allergies  Allergen Reactions  . Iohexol      Code: HIVES, Desc: PT STATES HE HAD AN IVP 15 YRS AGO AND HAD SOB AND BROKE OUT IN HIVES., Onset Date: [id number]     History   Social History  . Marital Status: Single    Spouse Name: N/A    Number of Children: N/A  . Years of Education: N/A   Occupational History  . Not on file.   Social History Main Topics  . Smoking status: Former Smoker -- 1.00  packs/day for 44 years    Types: Cigarettes    Quit date: 10/25/2012  . Smokeless tobacco: Not on file  . Alcohol Use: No  . Drug Use: No  . Sexual Activity: Not on file   Other Topics Concern  . Not on file   Social History Narrative   Retired.  Single.  No children.  Retired.  He formally worked Development worker, community.  He is a former heavy drinker, but stopped in 1995.  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 He quit on August 21, and is not felt an interest to smoked since.   He is a primary caregiver for his 71 year old mother.  He also has responsibilities of his to his niece, back and forth from work.       Review of Systems: General: negative for chills, fever, night sweats or weight changes.  Cardiovascular: negative for chest pain, dyspnea on exertion, edema, orthopnea, palpitations, paroxysmal nocturnal dyspnea or shortness of breath Dermatological: negative for rash Respiratory: negative for cough or wheezing Urologic: negative for hematuria Abdominal: negative for nausea, vomiting, diarrhea, bright red blood per rectum, melena, or hematemesis Neurologic: negative for visual changes, syncope, or dizziness All other systems reviewed and are otherwise negative except as noted above.    Blood pressure 100/60, pulse 88, height 5\' 9"  (1.753 m), weight 266 lb (120.657 kg).  General appearance: alert, cooperative, no distress and morbidly obese Neck: no carotid bruit and no JVD Lungs: clear to auscultation bilaterally Heart: regular rate and rhythm, S1, S2 normal, no murmur, click, rub or gallop Extremities: trace bilateral LEE Pulses: 2+ and symmetric Skin: ertythmea noted on the anterior suface of the RLE Neurologic: Grossly normal  EKG NSR; 88 bpm  ASSESSMENT AND PLAN:   Bilateral lower extremity edema Bilateral lower extremity dopplers were negative for DVT. 2D echo pending, however BNP was  normal, making the diagnosis of CHF less likely. He has had improvement in edema with increase in his Lasix, however subsequent to increase in his diuretic, recent BMP shows hypokalemia w/ a K of 3.2. This may explain his recent leg weakness and muscle fatigue.  I don't believe PVD to be a possible etiology as he has 2+ bilateral DPs and PT pulses. As CHF seems unlikely, he has been instructed to resume just once  a day Lasix (40 mg), as previously prescribed by [name] [name]. Continue daily potassium (20 mEq). He has f/u with Dr. Wilson [name] his PCP for reassessment and repeat BMP.   PLAN Return to 40 mg PO Lasix once daily with 20 mEq of supplemental potassium. Pt states he is to f/u with Dr. Wilson [name] his PCP. He is due back to f/u with [name] [name] for his yearly assessment in September. Pt was instructed to return sooner if needed.      Brittainy SimmonsPA-C 06/18/2013 8:39 AM

## 2013-06-18 NOTE — Progress Notes (Signed)
2D Echo Performed 06/18/2013    [name] [name], RCS

## 2013-06-18 NOTE — Assessment & Plan Note (Addendum)
Bilateral lower extremity dopplers were negative for DVT. 2D echo pending, however BNP was normal, making the diagnosis of CHF less likely. He has had improvement in edema with increase in his Lasix, however subsequent to increase in his diuretic, recent BMP (obatined by PCP) shows hypokalemia. This may explain his recent leg weakness and muscle fatigue.  I don't believe PVD to be a possible etiology as he has 2+ bilateral DPs and PTs pulses. As CHF seems unlikely, he has been instructed to resume just once a day Lasix, as previously prescribed by Dr. Ellyn Hack. Continue daily potassium (20 mEq). He has f/u with Dr. Wilson Singer his PCP for reassessment and repeat BMP.

## 2013-06-18 NOTE — Patient Instructions (Addendum)
Take furosemide  40 mg daily and also take your potassium  If you notice a weight gain of more than 2 -3  Pounds in a 24 hr period or 5 pounds in a week please call  We will make an appt. For you to see DrEllyn Hack in 12 months

## 2013-06-28 ENCOUNTER — Ambulatory Visit: Payer: Medicare HMO | Admitting: Cardiology

## 2013-10-01 ENCOUNTER — Telehealth: Payer: Self-pay | Admitting: Cardiology

## 2013-10-05 HISTORY — PX: TRANSTHORACIC ECHOCARDIOGRAM: SHX275

## 2013-10-08 NOTE — Telephone Encounter (Signed)
Closed encounter °

## 2013-10-14 ENCOUNTER — Ambulatory Visit (INDEPENDENT_AMBULATORY_CARE_PROVIDER_SITE_OTHER): Payer: Medicare HMO | Admitting: Cardiology

## 2013-10-14 ENCOUNTER — Encounter: Payer: Self-pay | Admitting: Cardiology

## 2013-10-14 VITALS — BP 100/60 | HR 99 | Ht 69.0 in | Wt 261.4 lb

## 2013-10-14 DIAGNOSIS — Z720 Tobacco use: Secondary | ICD-10-CM

## 2013-10-14 DIAGNOSIS — Z9861 Coronary angioplasty status: Secondary | ICD-10-CM

## 2013-10-14 DIAGNOSIS — R609 Edema, unspecified: Secondary | ICD-10-CM

## 2013-10-14 DIAGNOSIS — F17200 Nicotine dependence, unspecified, uncomplicated: Secondary | ICD-10-CM

## 2013-10-14 DIAGNOSIS — I2510 Atherosclerotic heart disease of native coronary artery without angina pectoris: Secondary | ICD-10-CM

## 2013-10-14 DIAGNOSIS — E785 Hyperlipidemia, unspecified: Secondary | ICD-10-CM

## 2013-10-14 DIAGNOSIS — R600 Localized edema: Secondary | ICD-10-CM

## 2013-10-14 NOTE — Assessment & Plan Note (Signed)
Lipids by Dr. Wilson Singer LDL is 46 HDL low at 29-  recommended mega red in the past fish oil caps have given him a fishy taste which he cannot stand.

## 2013-10-14 NOTE — Patient Instructions (Signed)
Your physician recommends that you schedule a follow-up appointment in: 6 months with Dr. Mallory Shirk support stockings as directed

## 2013-10-14 NOTE — Assessment & Plan Note (Signed)
Has stopped smoking  

## 2013-10-14 NOTE — Assessment & Plan Note (Signed)
Improved and stable, recommended support stockings from the drugstore to help prevent lower extremity edema

## 2013-10-14 NOTE — Assessment & Plan Note (Signed)
January 06: Taxus DES to RCA; March '06 Cypher DES to LAD; EF 66%  Last NUC Study August 2014 was negative for ischemia
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
## 2014-03-31 ENCOUNTER — Telehealth: Payer: Self-pay | Admitting: Cardiology

## 2014-03-31 NOTE — Telephone Encounter (Signed)
Close encounter 

## 2014-04-14 ENCOUNTER — Ambulatory Visit: Payer: Medicare HMO | Admitting: Cardiology

## 2014-06-27 ENCOUNTER — Encounter: Payer: Self-pay | Admitting: Cardiology

## 2014-06-27 ENCOUNTER — Ambulatory Visit (INDEPENDENT_AMBULATORY_CARE_PROVIDER_SITE_OTHER): Payer: PPO | Admitting: Cardiology

## 2014-06-27 VITALS — BP 110/68 | HR 77 | Ht 69.0 in | Wt 270.9 lb

## 2014-06-27 DIAGNOSIS — E785 Hyperlipidemia, unspecified: Secondary | ICD-10-CM | POA: Diagnosis not present

## 2014-06-27 DIAGNOSIS — Z720 Tobacco use: Secondary | ICD-10-CM

## 2014-06-27 DIAGNOSIS — I2510 Atherosclerotic heart disease of native coronary artery without angina pectoris: Secondary | ICD-10-CM

## 2014-06-27 DIAGNOSIS — Z9861 Coronary angioplasty status: Secondary | ICD-10-CM

## 2014-06-27 DIAGNOSIS — Z01810 Encounter for preprocedural cardiovascular examination: Secondary | ICD-10-CM | POA: Diagnosis not present

## 2014-06-27 DIAGNOSIS — I10 Essential (primary) hypertension: Secondary | ICD-10-CM

## 2014-06-27 DIAGNOSIS — R079 Chest pain, unspecified: Secondary | ICD-10-CM

## 2014-06-27 NOTE — Patient Instructions (Signed)
Your physician has requested that you have a lexiscan myoview. For further information please visit [email or web address]. Please follow instruction sheet, as given.  Your physician wants you to follow-up in: 2 months with Dr. Ellyn Hack. You will receive a reminder letter in the mail two months in advance. If you don't receive a letter, please call our office to schedule the follow-up appointment.
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
## 2014-06-29 ENCOUNTER — Encounter: Payer: Self-pay | Admitting: Cardiology

## 2014-06-29 DIAGNOSIS — Z01810 Encounter for preprocedural cardiovascular examination: Secondary | ICD-10-CM | POA: Insufficient documentation

## 2014-06-29 NOTE — Assessment & Plan Note (Signed)
Lipids monitored by his PCP. I don't have the most recent blood work result. He is on Crestor. Last results were indicated low HDL and at goal LDL.

## 2014-06-29 NOTE — Assessment & Plan Note (Signed)
Well-controlled on current regimen. ?

## 2014-06-29 NOTE — Assessment & Plan Note (Signed)
He has stents to the RCA and LAD. Negative Myoview in August of 14. First generation DES stents - Cypher/ & Taxus this were used. Therefore he remains on Plavix plus aspirin. Until we clear his Myoview results, I think we should continue the aspirin. After which he can probably stop aspirin. Remains on Imdur and ACE inhibitor.  He has not been on a beta blocker due to COPD. He is on statin

## 2014-06-29 NOTE — Assessment & Plan Note (Addendum)
Unsuccessful attempt at quitting smoking back in August of 2014. Given the relatively urgent nature of this visit I did not spend very much time discussing smoking cessation. I did however mention my concerns about him having started back and started spreading the seed for future discussions.

## 2014-06-29 NOTE — Assessment & Plan Note (Signed)
New onset chest discomfort while some of the features are very atypical in nature for angina so marked somewhat more typical. He is not a great historian and therefore is very difficult to tell the true nature of his symptoms. Interestingly these are all beginning with the onset of his umbilical hernia symptoms.  With the thought of him likely pursuing an invasive surgical options for his hernia, he would need preoperative evaluation. We then having known CAD we would need to investigate this potential moderate risk for cardiac etiology chest pain. Plan: Lexiscan Myoview. Given the relatively normal Myoview from a year and half ago to compare with.

## 2014-07-01 ENCOUNTER — Telehealth (HOSPITAL_COMMUNITY): Payer: Self-pay

## 2014-07-01 NOTE — Telephone Encounter (Signed)
Encounter complete. 

## 2014-07-02 ENCOUNTER — Ambulatory Visit (HOSPITAL_COMMUNITY)
Admission: RE | Admit: 2014-07-02 | Discharge: 2014-07-02 | Disposition: A | Discharge status: Home / Self Care | Payer: PPO | Source: Ambulatory Visit | Attending: Cardiology | Admitting: Cardiology

## 2014-07-02 DIAGNOSIS — Z9861 Coronary angioplasty status: Secondary | ICD-10-CM

## 2014-07-02 DIAGNOSIS — E669 Obesity, unspecified: Secondary | ICD-10-CM | POA: Insufficient documentation

## 2014-07-02 DIAGNOSIS — Z01818 Encounter for other preprocedural examination: Secondary | ICD-10-CM | POA: Diagnosis not present

## 2014-07-02 DIAGNOSIS — R079 Chest pain, unspecified: Secondary | ICD-10-CM | POA: Diagnosis not present

## 2014-07-02 DIAGNOSIS — E785 Hyperlipidemia, unspecified: Secondary | ICD-10-CM | POA: Insufficient documentation

## 2014-07-02 DIAGNOSIS — Z720 Tobacco use: Secondary | ICD-10-CM | POA: Insufficient documentation

## 2014-07-02 DIAGNOSIS — R0602 Shortness of breath: Secondary | ICD-10-CM | POA: Insufficient documentation

## 2014-07-02 DIAGNOSIS — Z8249 Family history of ischemic heart disease and other diseases of the circulatory system: Secondary | ICD-10-CM | POA: Diagnosis not present

## 2014-07-02 DIAGNOSIS — I2510 Atherosclerotic heart disease of native coronary artery without angina pectoris: Secondary | ICD-10-CM

## 2014-07-02 DIAGNOSIS — R5383 Other fatigue: Secondary | ICD-10-CM | POA: Diagnosis not present

## 2014-07-02 DIAGNOSIS — R42 Dizziness and giddiness: Secondary | ICD-10-CM | POA: Diagnosis not present

## 2014-07-02 DIAGNOSIS — I10 Essential (primary) hypertension: Secondary | ICD-10-CM | POA: Diagnosis not present

## 2014-07-02 IMAGING — NM NM MISC PROCEDURE
6 series · 36 of 36 positions shown · non-contrast
Comparison: none

[Series 1: wbr_r-proj_st wbr rest · 6.40mm/px · 6 of 64 frames shown]
[frame 6/64]
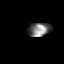
[frame 16/64]
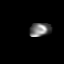
[frame 27/64]
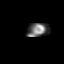
[frame 38/64]
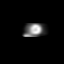
[frame 48/64]
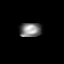
[frame 59/64]
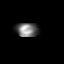

[Series 1: wbr rest · 6.40mm/px · 6 of 63 frames shown]
[frame 6/63]
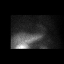
[frame 16/63]
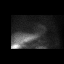
[frame 27/63]
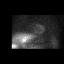
[frame 37/63]
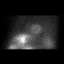
[frame 48/63]
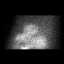
[frame 58/63]
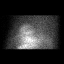

[Series 2: wbr stress-gsp · 6.40mm/px · 6 of 512 frames shown]
[frame 43/512]
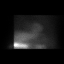
[frame 128/512]
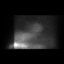
[frame 214/512]
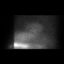
[frame 299/512]
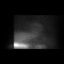
[frame 384/512]
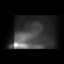
[frame 470/512]
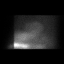

[Series 2: wbr_s-proj_st wbr stress-gsp · 6.40mm/px · 6 of 512 frames shown]
[frame 43/512]
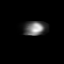
[frame 128/512]
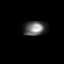
[frame 214/512]
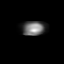
[frame 299/512]
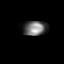
[frame 384/512]
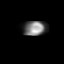
[frame 470/512]
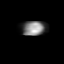

[Series 3: wbr_s-proj_st wbr stress-sum-em · 6.40mm/px · 6 of 64 frames shown]
[frame 6/64]
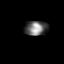
[frame 16/64]
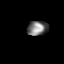
[frame 27/64]
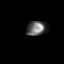
[frame 38/64]
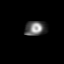
[frame 48/64]
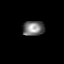
[frame 59/64]
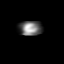

[Series 3: wbr stress-sum-em · 6.40mm/px · 6 of 64 frames shown]
[frame 6/64]
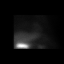
[frame 16/64]
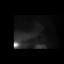
[frame 27/64]
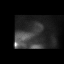
[frame 38/64]
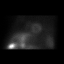
[frame 48/64]
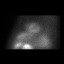
[frame 59/64]
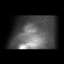

[36 of 36 positions shown; findings below may reference images not displayed]

Canned report from images found in remote index.

Refer to host system for actual result text.

## 2014-07-02 MED ORDER — TECHNETIUM TC 99M SESTAMIBI GENERIC - CARDIOLITE
10.0000 | Freq: Once | INTRAVENOUS | Status: AC | PRN
  Administered 2014-07-02: 10 via INTRAVENOUS

## 2014-07-02 MED ORDER — REGADENOSON 0.4 MG/5ML IV SOLN
0.4000 mg | Freq: Once | INTRAVENOUS | Status: AC
  Administered 2014-07-02: 0.4 mg via INTRAVENOUS

## 2014-07-02 MED ORDER — AMINOPHYLLINE 25 MG/ML IV SOLN
125.0000 mg | Freq: Once | INTRAVENOUS | Status: AC
  Administered 2014-07-02: 125 mg via INTRAVENOUS

## 2014-07-02 MED ORDER — TECHNETIUM TC 99M SESTAMIBI GENERIC - CARDIOLITE
31.2000 | Freq: Once | INTRAVENOUS | Status: AC | PRN
  Administered 2014-07-02: 31.2 via INTRAVENOUS

## 2014-07-02 NOTE — Procedures (Addendum)
[facility] [address] [place] [address] [phone]  Cardiology Nuclear Med Study  [name] [name] is a 59 y.o. male     MRN : [id number]     DOB: 1956-01-30  Procedure Date: 07/02/2014  Nuclear Med Background Indication for Stress Test:  Evaluation for Ischemia and Surgical Clearance History:  COPD and CAD;STENT/PTCA-03/2004 and 05/2004;Last NUC MPI on 10/04/2012-normal;EF59% Cardiac Risk Factors: Family History - CAD, Hypertension, Lipids, Obesity and Smoker  Symptoms:  Chest Pain, Dizziness, Fatigue, Light-Headedness and SOB   Nuclear Pre-Procedure Caffeine/Decaff Intake:  1:00am NPO After: 9:00am   IV Site: R Forearm  IV 0.9% NS with Angio Cath:  22g  Chest Size (in):  48" IV Started by: Larene Beach, RN  Height: '5\' 9"'$  (1.753 m)  Cup Size: n/a  BMI:  Body mass index is 39.85 kg/(m^2). Weight:  270 lb (122.471 kg)   Tech Comments:  n/a    Nuclear Med Study 1 or 2 day study: 1 day  Stress Test Type:  [place] Provider:  Glenetta Hew, MD   Resting Radionuclide: Technetium 40mSestamibi  Resting Radionuclide Dose: 10.9 mCi   Stress Radionuclide:  Technetium 990mestamibi  Stress Radionuclide Dose: 31.2 mCi           Stress Protocol Rest HR: 76 Stress HR: 89  Rest BP: 113/69 Stress BP: 114/66  Exercise Time (min): n/a METS: n/a          Dose of Adenosine (mg):  n/a Dose of Lexiscan: 0.4 mg  Dose of Atropine (mg): n/a Dose of Dobutamine: n/a mcg/kg/min (at max HR)  Stress Test Technologist: TeLeane ParaCCT Nuclear Technologist:Elizabeth Young,CNMT   Rest Procedure:  Myocardial perfusion imaging was performed at rest 45 minutes following the intravenous administration of Technetium 9988mstamibi. Stress Procedure:  The patient received IV Lexiscan 0.4 mg over 15-seconds.  Technetium 11m2mtamibi injected at 30-seconds.  Patient experienced SOB, Dizziness and Headache and 125 mg  aminophylline IV was administered.  There were no significant changes with Lexiscan.  Quantitative spect images were obtained after a 45 minute delay.  Transient Ischemic Dilatation (Normal <1.22):  1.13  QGS EDV:  92 ml QGS ESV:  28 ml LV Ejection Fraction: 69%    Rest ECG: NSR - Normal EKG  Stress ECG: No significant ST segment change suggestive of ischemia.  QPS Raw Data Images:  Acquisition technically good; normal left ventricular size. Stress Images:  Normal homogeneous uptake in all areas of the myocardium. Rest Images:  Normal homogeneous uptake in all areas of the myocardium. Subtraction (SDS):  No evidence of ischemia.  Impression Exercise Capacity:  Lexiscan with no exercise. BP Response:  Normal blood pressure response. Clinical Symptoms:  There is dyspnea. ECG Impression:  No significant ST segment change suggestive of ischemia. Comparison with Prior Nuclear Study: Compared to 10/04/12, no change  Overall Impression:  Normal stress nuclear study.  LV Wall Motion:  NL LV Function; NL Wall Motion   [name] [name]  07/02/2014 5:04 PM

## 2014-07-03 NOTE — Progress Notes (Signed)
Quick Note:  Stress Test looked good!! No sign of significant Heart Artery Disease. Pump function is normal. Non-Cardiac Chest Pain.  Good news!!.  [name], MD  ______

## 2014-07-07 ENCOUNTER — Other Ambulatory Visit (INDEPENDENT_AMBULATORY_CARE_PROVIDER_SITE_OTHER): Payer: Self-pay | Admitting: General Surgery

## 2014-07-07 DIAGNOSIS — K432 Incisional hernia without obstruction or gangrene: Secondary | ICD-10-CM

## 2014-07-07 NOTE — Addendum Note (Signed)
Addended by: Adin Hector on: 07/07/2014 11:24 AM   Modules accepted: Orders

## 2014-07-08 ENCOUNTER — Other Ambulatory Visit: Payer: Self-pay | Admitting: *Deleted

## 2014-07-08 DIAGNOSIS — K432 Incisional hernia without obstruction or gangrene: Secondary | ICD-10-CM

## 2014-07-14 ENCOUNTER — Ambulatory Visit
Admission: RE | Admit: 2014-07-14 | Discharge: 2014-07-14 | Disposition: A | Discharge status: Home / Self Care | Payer: PPO | Source: Ambulatory Visit | Attending: General Surgery | Admitting: General Surgery

## 2014-07-14 ENCOUNTER — Other Ambulatory Visit: Payer: Self-pay | Admitting: General Surgery

## 2014-07-14 IMAGING — CT CT ABD-PELV W/O CM
1 of 2 series · 15 of 32 positions shown, 19 images · non-contrast
Comparison: None.

CLINICAL DATA: Mid abdominal pain for 2 months. Lump next to the
umbilicus.

EXAM:
CT ABDOMEN AND PELVIS WITHOUT CONTRAST
TECHNIQUE: Multidetector CT imaging of the abdomen and pelvis was performed
following the standard protocol without IV contrast.

[Series 2: abd pelvis wo 5.0 i41s 1 · axial · 0.97mm/px · z∈[-459,+11]mm · 15 of 104 slices shown, 19 images]
[im 5/104  soft-tissue]
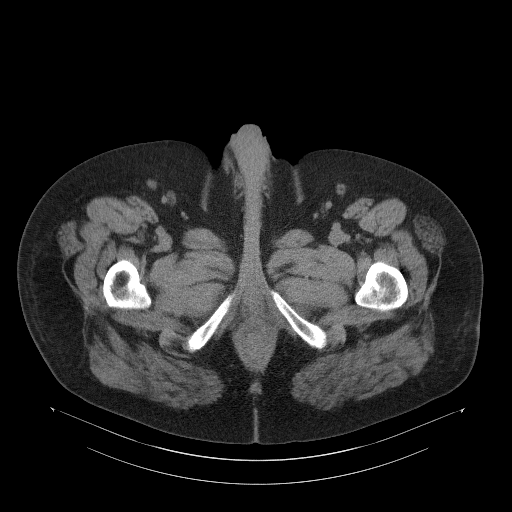
[im 5/104  bone]
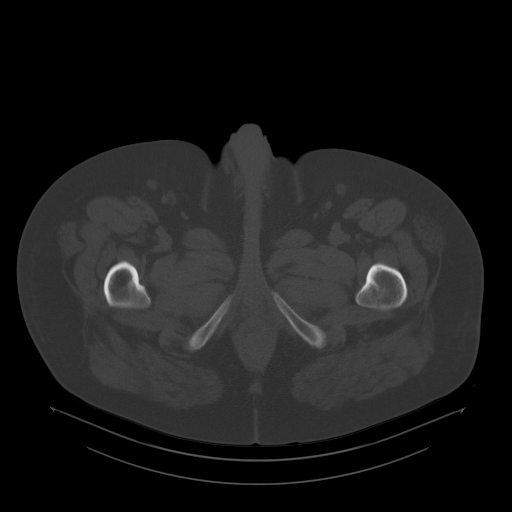
[im 14/104  soft-tissue]
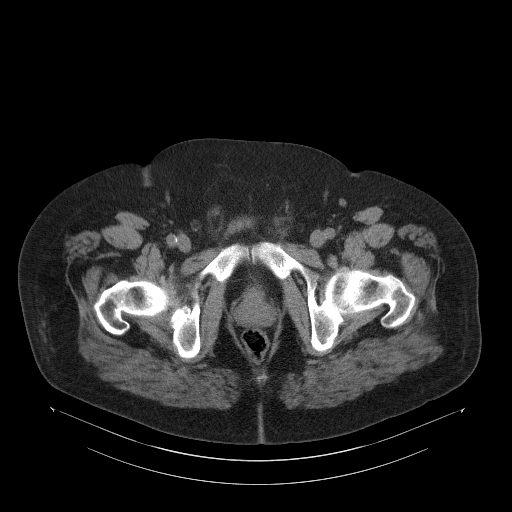
[im 23/104  soft-tissue]
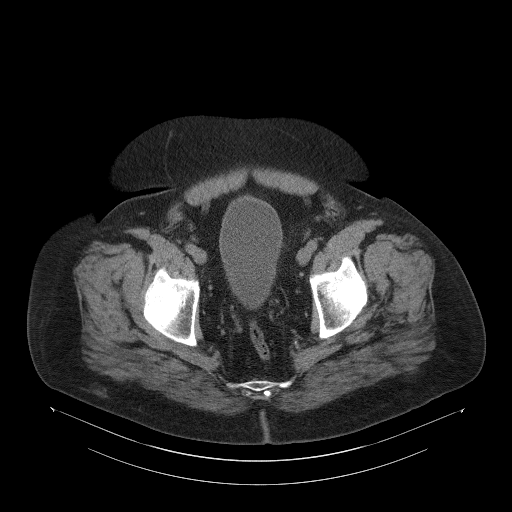
[im 27/104  soft-tissue]
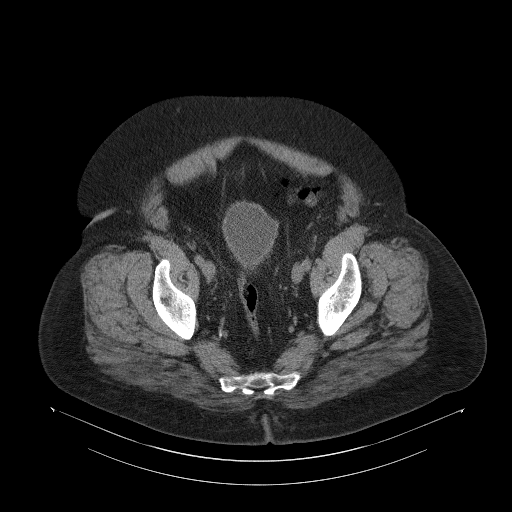
[im 36/104  soft-tissue]
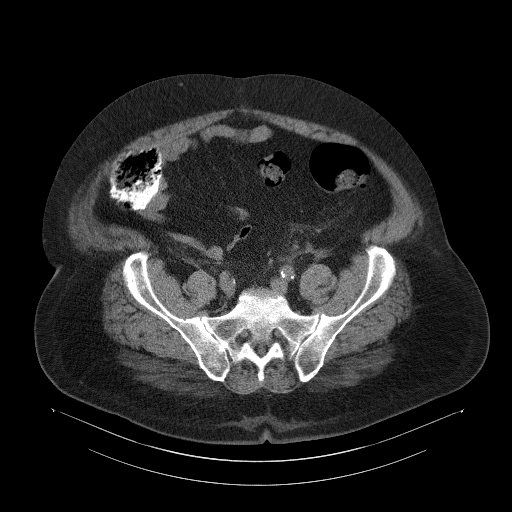
[im 45/104  soft-tissue]
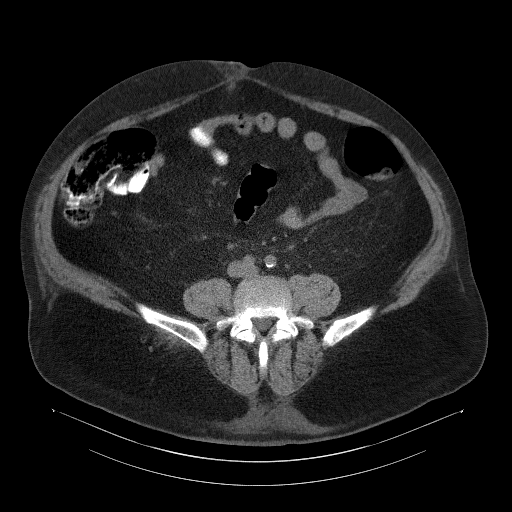
[im 54/104  soft-tissue]
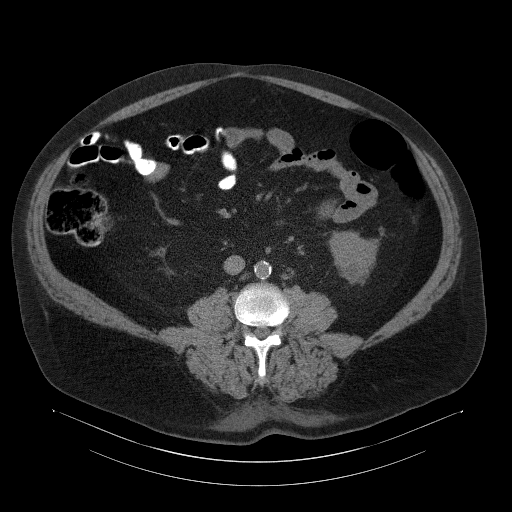
[im 59/104  soft-tissue]
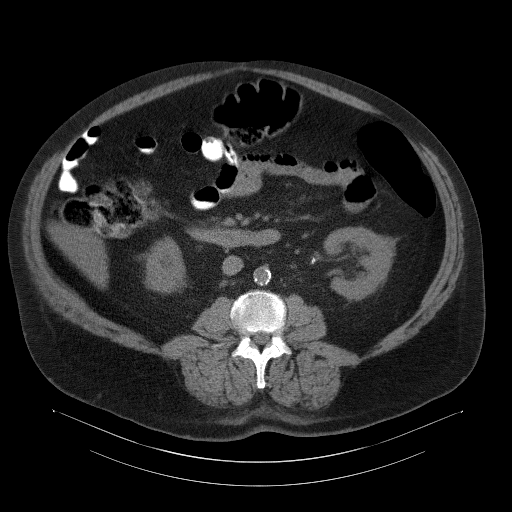
[im 68/104  soft-tissue]
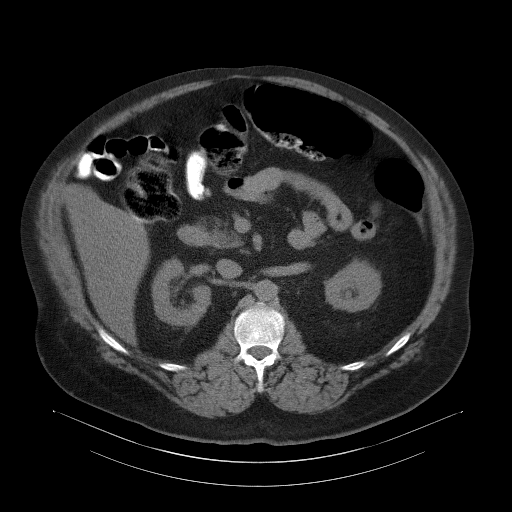
[im 68/104  bone]
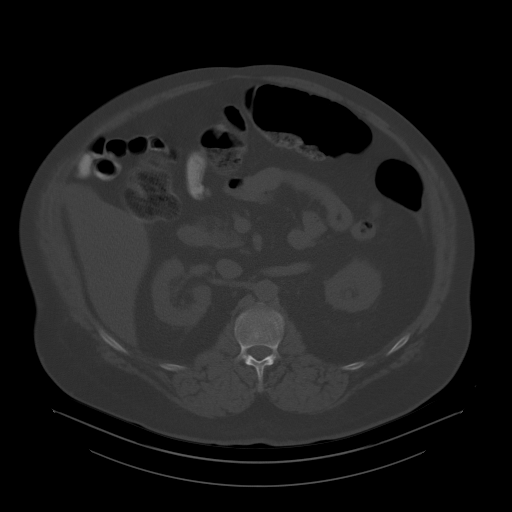
[im 77/104  soft-tissue]
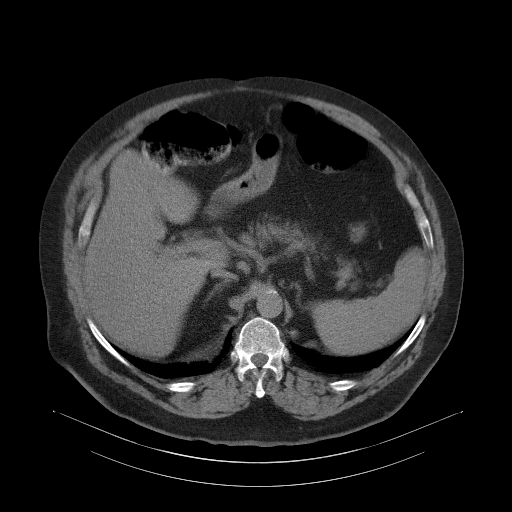
[im 81/104  soft-tissue]
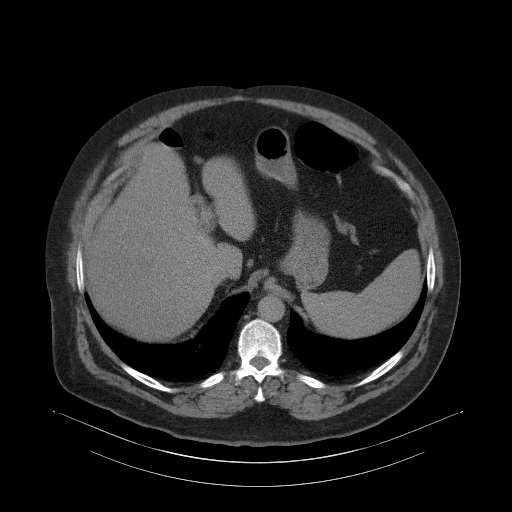
[im 86/104  lung]
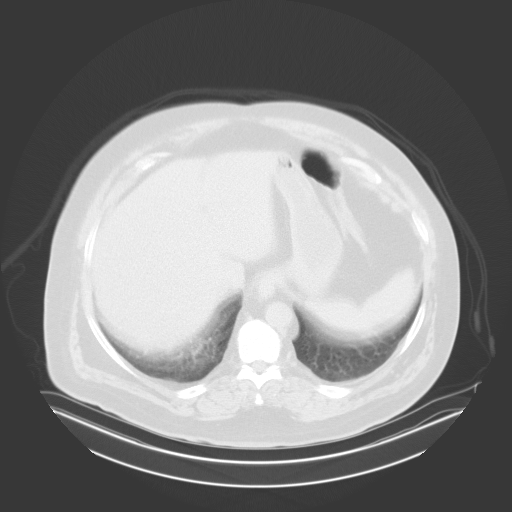
[im 90/104  soft-tissue]
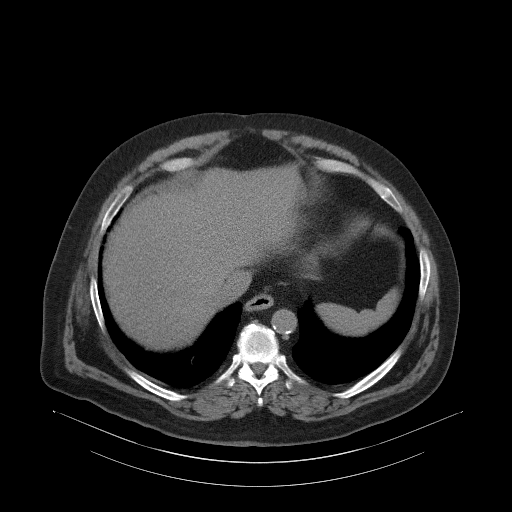
[im 90/104  lung]
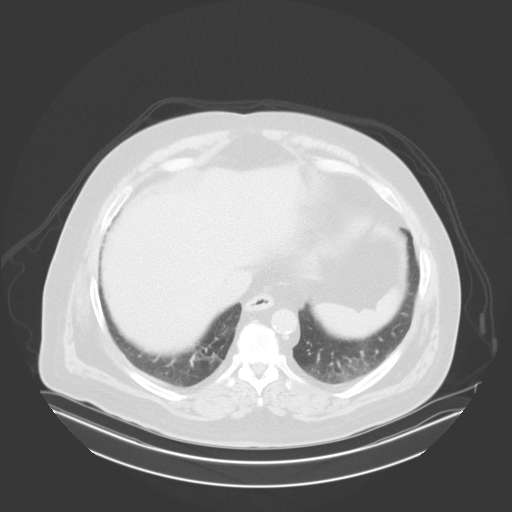
[im 95/104  lung]
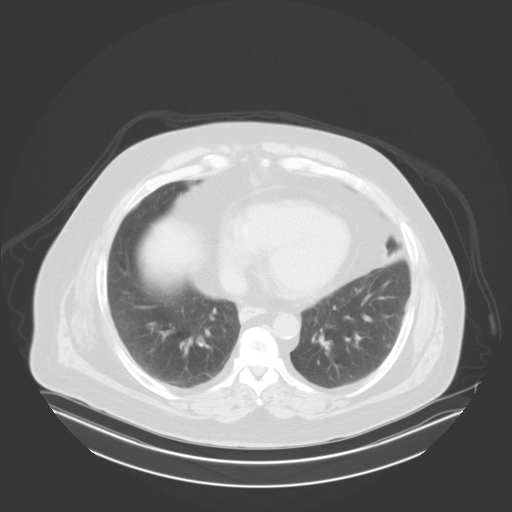
[im 99/104  soft-tissue]
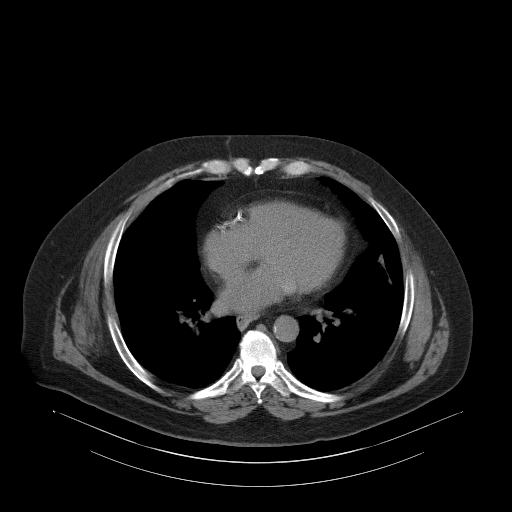
[im 99/104  lung]
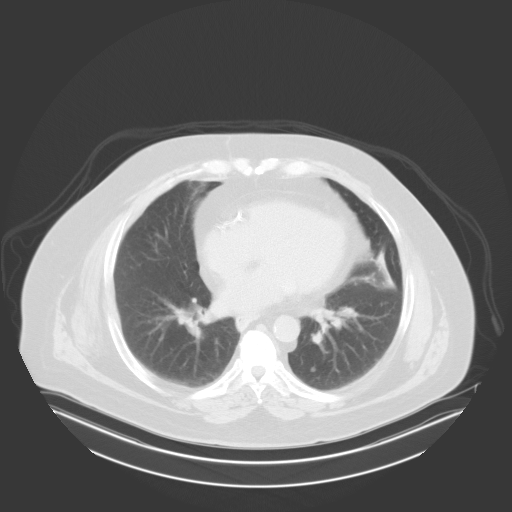

[15 of 32 positions shown; findings below may reference images not displayed]

FINDINGS: The lung bases are clear.

No renal, ureteral, or bladder calculi. No obstructive uropathy.
Bilateral nonspecific perinephric stranding. There is a 16 mm
hypodense, fluid attenuating left midpole renal mass most consistent
with a cyst. The bladder is unremarkable.

The liver demonstrates no focal abnormality. The gallbladder is
unremarkable. The spleen demonstrates no focal abnormality. The
adrenal glands and pancreas are normal.

The stomach, duodenum, small intestine and large intestine are
unremarkable. There is a small right paraumbilical hernia with soft
tissue attenuation within the hernia which may reflect fat
necrosis/infarct. Just inferiorly, there is a small adjacent fact
containing right paraumbilical hernia. There is no pneumoperitoneum,
pneumatosis, or portal venous gas. There is no abdominal or pelvic
free fluid. There is no lymphadenopathy.

The abdominal aorta is normal in caliber with atherosclerosis.

There are multiple old left lateral rib fractures. There is no lytic
or sclerotic osseous lesion.
IMPRESSION: 1. There is a small right paraumbilical hernia with soft tissue
attenuation within the hernia which may reflect fat
necrosis/infarct.

## 2014-07-31 NOTE — Pre-Procedure Instructions (Signed)
[name] [name]  07/31/2014      [facility] - [place], [place] - [address] AT [facility] [address] [place] [address] Phone: [phone] Fax: [phone]    Your procedure is scheduled on Fri, June 3 @ 9:30 AM  Report to [facility]  at 7:30 AM  Call this number if you have problems the morning of surgery:  [phone]   Remember:  Do not eat food or drink liquids after midnight.  Take these medicines the morning of surgery with A SIP OF WATER:Alprazolam(Xanax),,FlonaseIFluticsone),Isosorbide(Imdur),and Pain Pill(if needed)               Stop taking your Aspirin and Plavix as you have been instructed to do. No Goody's,BC's,Aleve,Ibuprofin,Fish Oil,or any Herbal Medications.    Do not wear jewelry.  Do not wear lotions, powders, or colognes.  You may wear deodorant.  Men may shave face and neck.  Do not bring valuables to the hospital.  [facility] is not responsible for any belongings or valuables.  Contacts, dentures or bridgework may not be worn into surgery.  Leave your suitcase in the car.  After surgery it may be brought to your room.  For patients admitted to the hospital, discharge time will be determined by your treatment team.  Patients discharged the day of surgery will not be allowed to drive home.    Special instructions:  [facility] - Preparing for Surgery  Before surgery, you can play an important role.  Because skin is not sterile, your skin needs to be as free of germs as possible.  You can reduce the number of germs on you skin by washing with CHG (chlorahexidine gluconate) soap before surgery.  CHG is an antiseptic cleaner which kills germs and bonds with the skin to continue killing germs even after washing.  Please DO NOT use if you have an allergy to CHG or antibacterial soaps.  If your skin becomes reddened/irritated stop using the CHG and inform your nurse when you arrive at Short Stay.  Do  not shave (including legs and underarms) for at least 48 hours prior to the first CHG shower.  You may shave your face.  Please follow these instructions carefully:   1.  Shower with CHG Soap the night before surgery and the                                morning of Surgery.  2.  If you choose to wash your hair, wash your hair first as usual with your       normal shampoo.  3.  After you shampoo, rinse your hair and body thoroughly to remove the                      Shampoo.  4.  Use CHG as you would any other liquid soap.  You can apply chg directly       to the skin and wash gently with scrungie or a clean washcloth.  5.  Apply the CHG Soap to your body ONLY FROM THE NECK DOWN.        Do not use on open wounds or open sores.  Avoid contact with your eyes,       ears, mouth and genitals (private parts).  Wash genitals (private parts)  with your normal soap.  6.  Wash thoroughly, paying special attention to the area where your surgery        will be performed.  7.  Thoroughly rinse your body with warm water from the neck down.  8.  DO NOT shower/wash with your normal soap after using and rinsing off       the CHG Soap.  9.  Pat yourself dry with a clean towel.            10.  Wear clean pajamas.            11.  Place clean sheets on your bed the night of your first shower and do not        sleep with pets.  Day of Surgery  Do not apply any lotions/deoderants the morning of surgery.  Please wear clean clothes to the hospital/surgery center.    Please read over the following fact sheets that you were given. Pain Booklet, Coughing and Deep Breathing and Surgical Site Infection Prevention

## 2014-08-01 ENCOUNTER — Encounter (HOSPITAL_COMMUNITY)
Admission: RE | Admit: 2014-08-01 | Discharge: 2014-08-01 | Disposition: A | Discharge status: Home / Self Care | Payer: PPO | Source: Ambulatory Visit | Attending: General Surgery | Admitting: General Surgery

## 2014-08-01 ENCOUNTER — Encounter (HOSPITAL_COMMUNITY): Payer: Self-pay

## 2014-08-01 ENCOUNTER — Other Ambulatory Visit: Payer: Self-pay | Admitting: General Surgery

## 2014-08-01 LAB — CBC
HCT: 40.6 % (ref 39.0–52.0)
Hemoglobin: 13.6 g/dL (ref 13.0–17.0)
MCH: 31.9 pg (ref 26.0–34.0)
MCHC: 33.5 g/dL (ref 30.0–36.0)
MCV: 95.3 fL (ref 78.0–100.0)
PLATELETS: 343 10*3/uL (ref 150–400)
RBC: 4.26 MIL/uL (ref 4.22–5.81)
RDW: 12.7 % (ref 11.5–15.5)
WBC: 10.7 10*3/uL — AB (ref 4.0–10.5)

## 2014-08-01 LAB — BASIC METABOLIC PANEL
Anion gap: 8 (ref 5–15)
BUN: 9 mg/dL (ref 6–20)
CALCIUM: 9.3 mg/dL (ref 8.9–10.3)
CHLORIDE: 96 mmol/L — AB (ref 101–111)
CO2: 29 mmol/L (ref 22–32)
CREATININE: 1.12 mg/dL (ref 0.61–1.24)
GFR calc Af Amer: >60 mL/min (ref >60)
GFR calc non Af Amer: >60 mL/min (ref >60)
Glucose, Bld: 105 mg/dL — ABNORMAL HIGH (ref 65–99)
Potassium: 4.5 mmol/L (ref 3.5–5.1)
SODIUM: 133 mmol/L — AB (ref 135–145)

## 2014-08-01 NOTE — Progress Notes (Signed)
   08/01/14 1128  OBSTRUCTIVE SLEEP APNEA  Have you ever been diagnosed with sleep apnea through a sleep study? No  Do you snore loudly (loud enough to be heard through closed doors)?  0  Do you often feel tired, fatigued, or sleepy during the daytime? 1  Has anyone observed you stop breathing during your sleep? 0  Do you have, or are you being treated for high blood pressure? 1  BMI more than 35 kg/m2? 1  Age over 59 years old? 1  Neck circumference greater than 40 cm/16 inches? 1  Gender: 1

## 2014-08-01 NOTE — Progress Notes (Signed)
Call to [facility], spoke with Abigail Butts, requested preop orders be entered by Dr. Dalbert Batman.

## 2014-08-01 NOTE — Progress Notes (Signed)
Pt. Followed by Dr. Wilson Singer- PCP, Dr. Ellyn Hack - cardiac, recently reviewed & cleared for surgery but will have anesth. Team review chart.

## 2014-08-01 NOTE — Progress Notes (Signed)
Pt. Denies coughing, and any  changes in his chest.  Reports that he has COPD, doesn't think he has ever had a visit with a specialist ( pulmonologist), states he uses his inhaler as needed.

## 2014-08-01 NOTE — Progress Notes (Signed)
Pt. Aware of need to hold plavix, states he took last dose on 07/31/2014, instructions were given by Dr. Dalbert Batman, per pt.

## 2014-08-04 NOTE — Progress Notes (Signed)
Anesthesia Chart Review:  Pt is 59 year old male scheduled for laparoscopic inguinal hernia repair, possible open repair, insertion of mesh on 08/08/2014 with Dr. Dalbert Batman.   Cardiologist is Dr. Ellyn Hack, last office visit 06/29/2014  PMH includes: CAD (January 06: Taxus DES to RCA; March '06 Cypher DES to LAD; EF 66%), HTN, dyslipidemia, COPD, GERD. Current smoker. BMI 40.   Medications include: ASA, plavix, lasix, imdur, lisinopril, crestor.  Pt took last dose of plavix 07/31/2014.   Preoperative labs reviewed.  Orders were not signed and therefore PT/PTT as requested by Dr. Dalbert Batman were not obtained.  Will need to be drawn DOS.   EKG 06/27/2014: NSR.   Nuclear stress test 07/02/2014: Overall Impression: Normal stress nuclear study. LV Wall Motion: NL LV Function; NL Wall Motion.  Echo 06/18/2013: -Left ventricle: The cavity size was normal. Systolic function was normal. The estimated ejection fraction was in the range of 60% to 65%. Wall motion was normal; there were no regional wall motion abnormalities. Doppler parameters are consistent with abnormal left ventricular relaxation (grade 1 diastolic dysfunction).         If no changes, I anticipate pt can proceed with surgery as scheduled.   Willeen Cass, FNP-BC [facility]/Anesthesiology Phone: [phone] 08/04/2014 12:21 PM
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
## 2014-08-07 MED ORDER — DEXTROSE 5 % IV SOLN
3.0000 g | INTRAVENOUS | Status: DC
  Filled 2014-08-07: qty 3000

## 2014-08-08 ENCOUNTER — Ambulatory Visit (HOSPITAL_COMMUNITY): Payer: PPO | Admitting: Emergency Medicine

## 2014-08-08 ENCOUNTER — Encounter (HOSPITAL_COMMUNITY)
Admission: RE | Disposition: A | Discharge status: Home / Self Care | Payer: Self-pay | Source: Ambulatory Visit | Attending: General Surgery

## 2014-08-08 ENCOUNTER — Ambulatory Visit (HOSPITAL_COMMUNITY): Payer: PPO | Admitting: Anesthesiology

## 2014-08-08 ENCOUNTER — Ambulatory Visit (HOSPITAL_COMMUNITY): Payer: PPO

## 2014-08-08 ENCOUNTER — Encounter (HOSPITAL_COMMUNITY): Payer: Self-pay | Admitting: *Deleted

## 2014-08-08 ENCOUNTER — Inpatient Hospital Stay (HOSPITAL_COMMUNITY)
Admission: RE | Admit: 2014-08-08 | Discharge: 2014-08-15 | DRG: 336 | Disposition: A | Discharge status: Home / Self Care | Payer: PPO | Source: Ambulatory Visit | Attending: General Surgery | Admitting: General Surgery

## 2014-08-08 DIAGNOSIS — K567 Ileus, unspecified: Secondary | ICD-10-CM

## 2014-08-08 DIAGNOSIS — Z955 Presence of coronary angioplasty implant and graft: Secondary | ICD-10-CM

## 2014-08-08 DIAGNOSIS — E876 Hypokalemia: Secondary | ICD-10-CM | POA: Diagnosis not present

## 2014-08-08 DIAGNOSIS — Z7902 Long term (current) use of antithrombotics/antiplatelets: Secondary | ICD-10-CM

## 2014-08-08 DIAGNOSIS — Z6841 Body Mass Index (BMI) 40.0 and over, adult: Secondary | ICD-10-CM

## 2014-08-08 DIAGNOSIS — F1120 Opioid dependence, uncomplicated: Secondary | ICD-10-CM | POA: Diagnosis present

## 2014-08-08 DIAGNOSIS — L03311 Cellulitis of abdominal wall: Secondary | ICD-10-CM | POA: Diagnosis not present

## 2014-08-08 DIAGNOSIS — I10 Essential (primary) hypertension: Secondary | ICD-10-CM | POA: Diagnosis present

## 2014-08-08 DIAGNOSIS — J449 Chronic obstructive pulmonary disease, unspecified: Secondary | ICD-10-CM | POA: Diagnosis present

## 2014-08-08 DIAGNOSIS — Z79899 Other long term (current) drug therapy: Secondary | ICD-10-CM

## 2014-08-08 DIAGNOSIS — K6389 Other specified diseases of intestine: Secondary | ICD-10-CM | POA: Diagnosis not present

## 2014-08-08 DIAGNOSIS — I2510 Atherosclerotic heart disease of native coronary artery without angina pectoris: Secondary | ICD-10-CM | POA: Diagnosis present

## 2014-08-08 DIAGNOSIS — Z7982 Long term (current) use of aspirin: Secondary | ICD-10-CM

## 2014-08-08 DIAGNOSIS — R509 Fever, unspecified: Secondary | ICD-10-CM

## 2014-08-08 DIAGNOSIS — K219 Gastro-esophageal reflux disease without esophagitis: Secondary | ICD-10-CM | POA: Diagnosis present

## 2014-08-08 DIAGNOSIS — Z01811 Encounter for preprocedural respiratory examination: Secondary | ICD-10-CM

## 2014-08-08 DIAGNOSIS — K660 Peritoneal adhesions (postprocedural) (postinfection): Secondary | ICD-10-CM | POA: Diagnosis present

## 2014-08-08 DIAGNOSIS — K430 Incisional hernia with obstruction, without gangrene: Principal | ICD-10-CM | POA: Diagnosis present

## 2014-08-08 DIAGNOSIS — R197 Diarrhea, unspecified: Secondary | ICD-10-CM | POA: Diagnosis not present

## 2014-08-08 DIAGNOSIS — T360X5A Adverse effect of penicillins, initial encounter: Secondary | ICD-10-CM | POA: Diagnosis not present

## 2014-08-08 DIAGNOSIS — F17210 Nicotine dependence, cigarettes, uncomplicated: Secondary | ICD-10-CM | POA: Diagnosis present

## 2014-08-08 DIAGNOSIS — M797 Fibromyalgia: Secondary | ICD-10-CM | POA: Diagnosis present

## 2014-08-08 DIAGNOSIS — E785 Hyperlipidemia, unspecified: Secondary | ICD-10-CM | POA: Diagnosis present

## 2014-08-08 DIAGNOSIS — F419 Anxiety disorder, unspecified: Secondary | ICD-10-CM | POA: Diagnosis present

## 2014-08-08 HISTORY — DX: Anxiety disorder, unspecified: F41.9

## 2014-08-08 HISTORY — PX: INCISIONAL HERNIA REPAIR: SHX193

## 2014-08-08 HISTORY — DX: Pneumonia, unspecified organism: J18.9

## 2014-08-08 HISTORY — DX: Headache: R51

## 2014-08-08 HISTORY — DX: Unspecified osteoarthritis, unspecified site: M19.90

## 2014-08-08 HISTORY — DX: Myoneural disorder, unspecified: G70.9

## 2014-08-08 HISTORY — DX: Headache, unspecified: R51.9

## 2014-08-08 HISTORY — PX: LAPAROSCOPIC LYSIS OF ADHESIONS: SHX5905

## 2014-08-08 HISTORY — PX: INSERTION OF MESH: SHX5868

## 2014-08-08 HISTORY — DX: Gastro-esophageal reflux disease without esophagitis: K21.9

## 2014-08-08 HISTORY — DX: Chronic obstructive pulmonary disease, unspecified: J44.9

## 2014-08-08 HISTORY — PX: LAPAROSCOPIC INCISIONAL / UMBILICAL / VENTRAL HERNIA REPAIR: SUR789

## 2014-08-08 LAB — CBC WITH DIFFERENTIAL/PLATELET
Basophils Absolute: 0 10*3/uL (ref 0.0–0.1)
Basophils Relative: 0 % (ref 0–1)
Eosinophils Absolute: 0.3 10*3/uL (ref 0.0–0.7)
Eosinophils Relative: 2 % (ref 0–5)
HCT: 36.6 % — ABNORMAL LOW (ref 39.0–52.0)
Hemoglobin: 12.8 g/dL — ABNORMAL LOW (ref 13.0–17.0)
Lymphocytes Relative: 14 % (ref 12–46)
Lymphs Abs: 1.8 10*3/uL (ref 0.7–4.0)
MCH: 32.7 pg (ref 26.0–34.0)
MCHC: 35 g/dL (ref 30.0–36.0)
MCV: 93.6 fL (ref 78.0–100.0)
Monocytes Absolute: 0.8 10*3/uL (ref 0.1–1.0)
Monocytes Relative: 6 % (ref 3–12)
Neutro Abs: 9.8 10*3/uL — ABNORMAL HIGH (ref 1.7–7.7)
Neutrophils Relative %: 78 % — ABNORMAL HIGH (ref 43–77)
Platelets: 280 10*3/uL (ref 150–400)
RBC: 3.91 MIL/uL — ABNORMAL LOW (ref 4.22–5.81)
RDW: 12.7 % (ref 11.5–15.5)
WBC: 12.7 10*3/uL — ABNORMAL HIGH (ref 4.0–10.5)

## 2014-08-08 LAB — COMPREHENSIVE METABOLIC PANEL
ALK PHOS: 67 U/L (ref 38–126)
ALT: 16 U/L — ABNORMAL LOW (ref 17–63)
AST: 24 U/L (ref 15–41)
Albumin: 3.8 g/dL (ref 3.5–5.0)
Anion gap: 11 (ref 5–15)
BUN: 21 mg/dL — ABNORMAL HIGH (ref 6–20)
CO2: 23 mmol/L (ref 22–32)
CREATININE: 1.62 mg/dL — AB (ref 0.61–1.24)
Calcium: 8.9 mg/dL (ref 8.9–10.3)
Chloride: 97 mmol/L — ABNORMAL LOW (ref 101–111)
GFR, EST AFRICAN AMERICAN: 52 mL/min — AB (ref >60)
GFR, EST NON AFRICAN AMERICAN: 45 mL/min — AB (ref >60)
Glucose, Bld: 108 mg/dL — ABNORMAL HIGH (ref 65–99)
POTASSIUM: 4.7 mmol/L (ref 3.5–5.1)
Sodium: 131 mmol/L — ABNORMAL LOW (ref 135–145)
Total Bilirubin: 1.2 mg/dL (ref 0.3–1.2)
Total Protein: 6.5 g/dL (ref 6.5–8.1)

## 2014-08-08 LAB — APTT: aPTT: 30 seconds (ref 24–37)

## 2014-08-08 LAB — PROTIME-INR
INR: 1.03 (ref 0.00–1.49)
Prothrombin Time: 13.7 seconds (ref 11.6–15.2)

## 2014-08-08 LAB — SURGICAL PCR SCREEN
MRSA, PCR: NEGATIVE
STAPHYLOCOCCUS AUREUS: NEGATIVE

## 2014-08-08 IMAGING — CR DG CHEST 2V
2 series · 2 of 2 positions shown · non-contrast
Comparison: Chest radiograph [DATE]; chest CT [DATE]

CLINICAL DATA: Preoperative for umbilical hernia repair.
Hypertension. Three-week history of cough

EXAM:
CHEST  2 VIEW

[w chest pa]
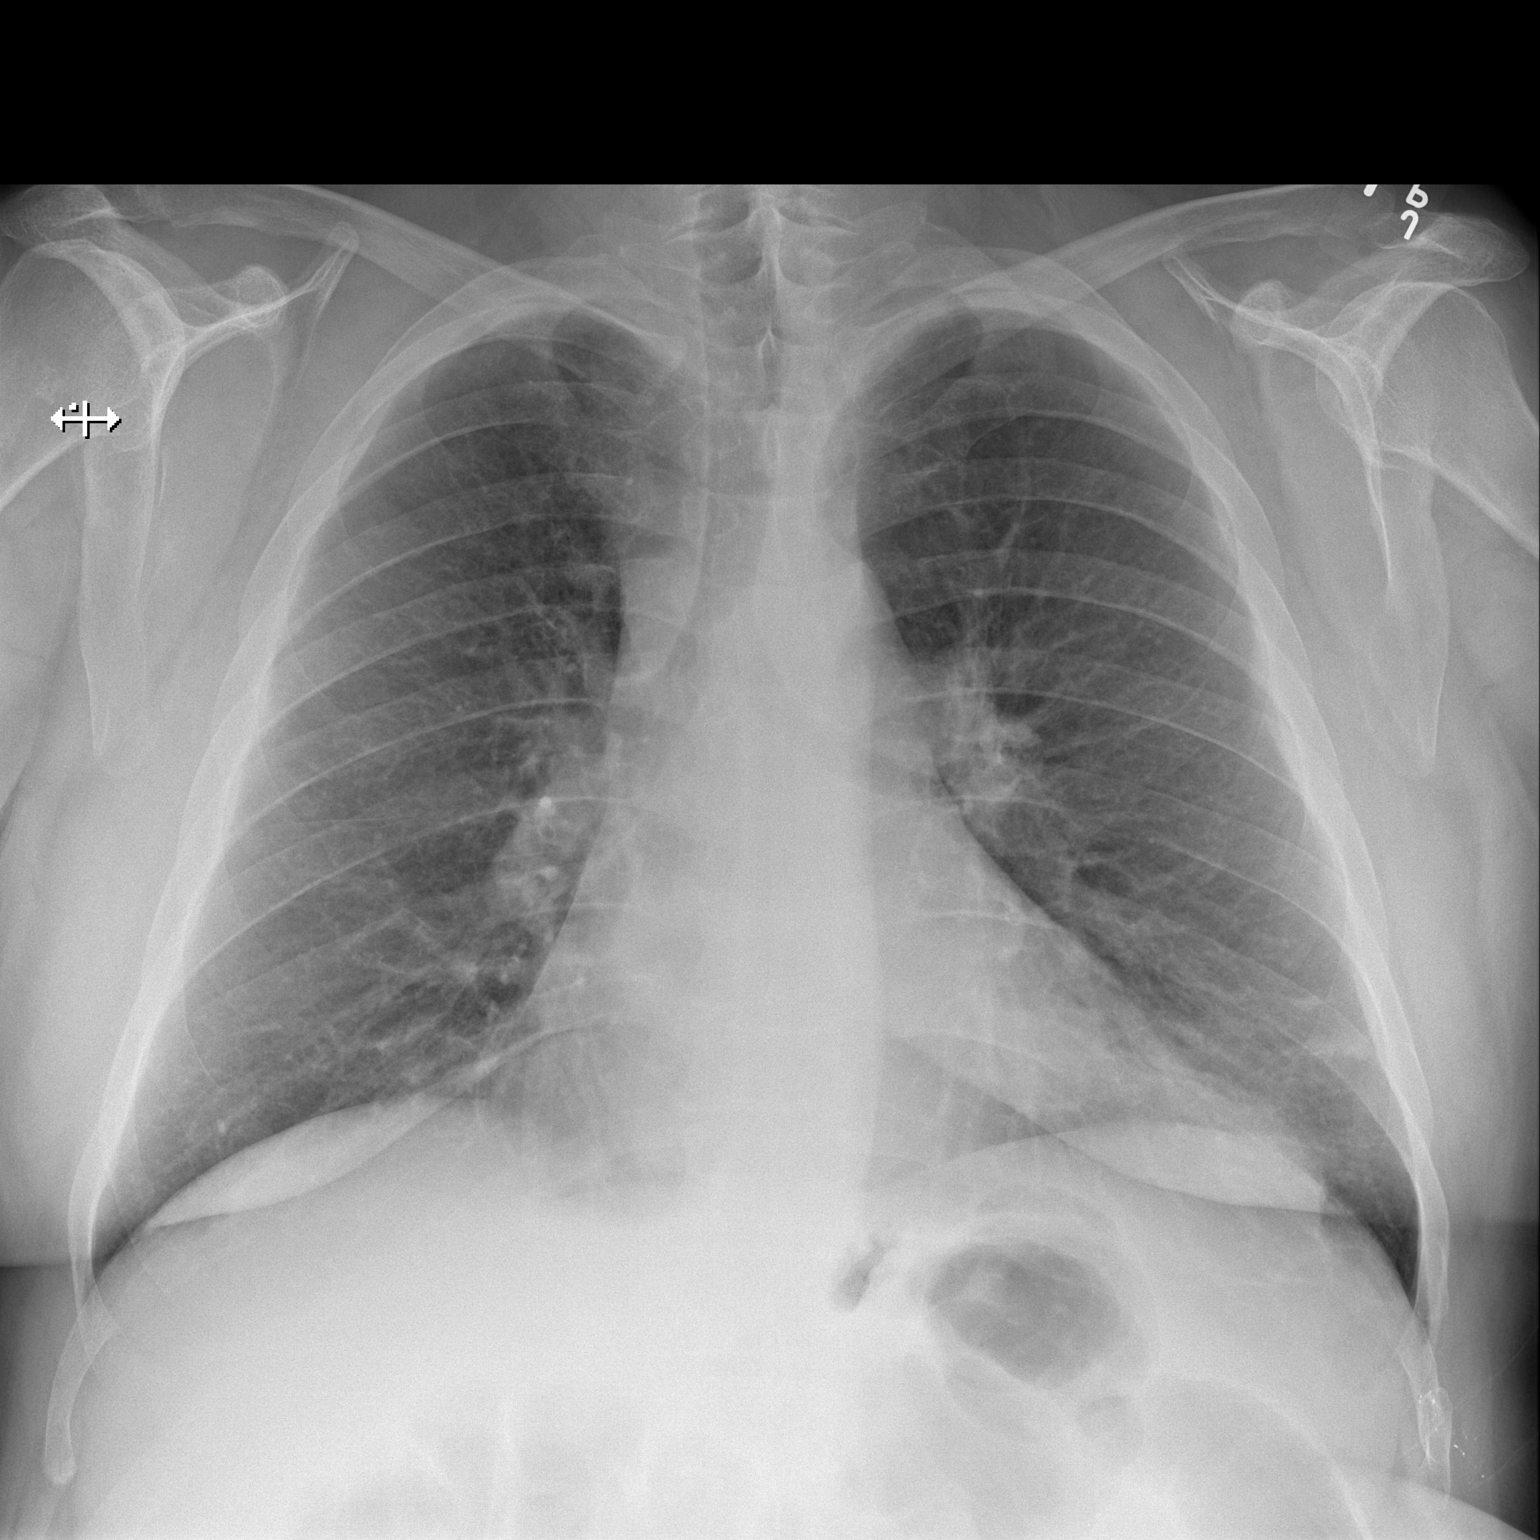

[w chest lat]
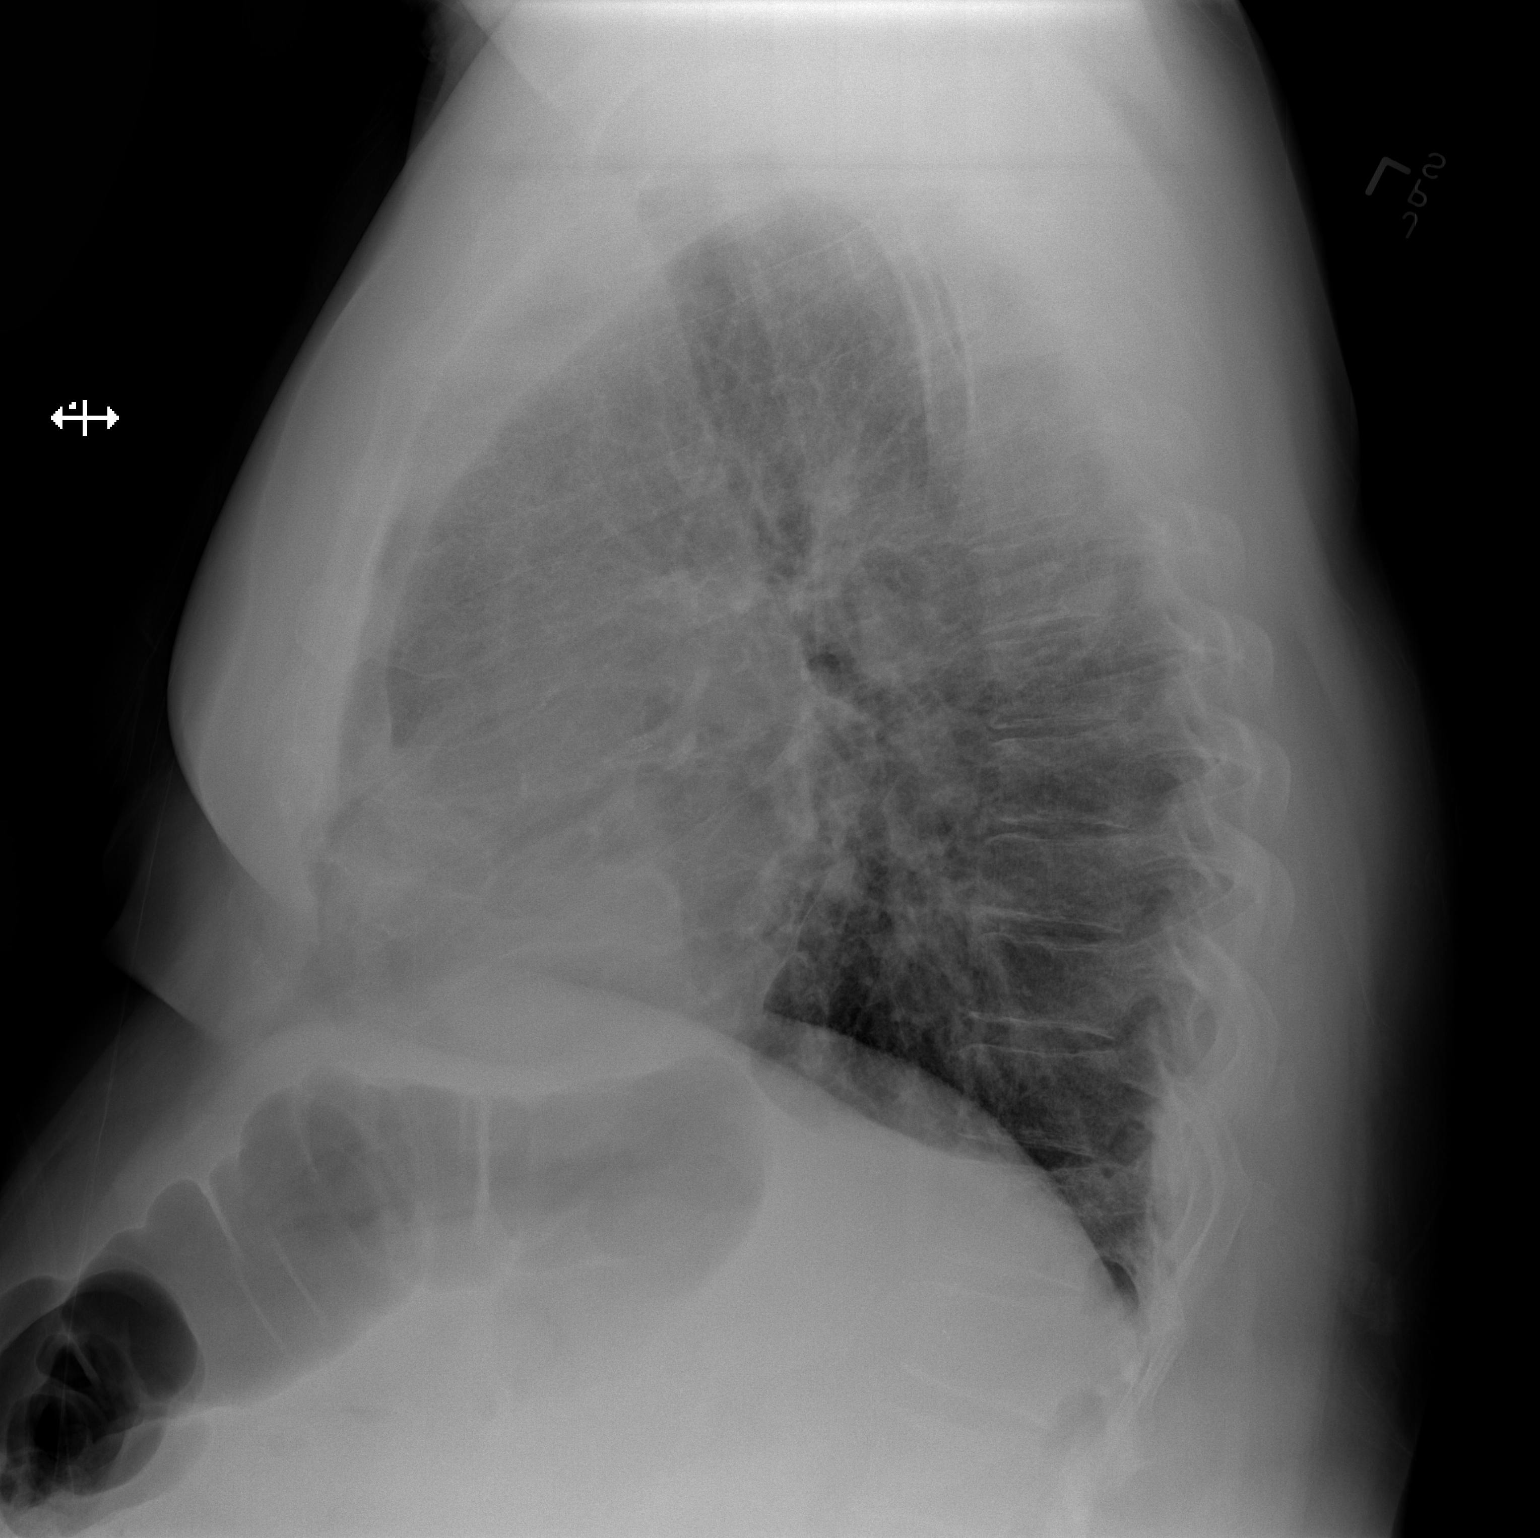

[2 of 2 positions shown; findings below may reference images not displayed]

FINDINGS: There is subsegmental atelectasis in the left base. Lungs are
otherwise clear. Heart size and pulmonary vascularity are normal. No
adenopathy. No bone lesions.
IMPRESSION: Subsegmental atelectasis left base.  Lungs elsewhere clear.

## 2014-08-08 SURGERY — REPAIR, HERNIA, INCISIONAL, LAPAROSCOPIC
Anesthesia: General | Site: Abdomen

## 2014-08-08 MED ORDER — MOMETASONE FURO-FORMOTEROL FUM 100-5 MCG/ACT IN AERO
2.0000 | INHALATION_SPRAY | Freq: Two times a day (BID) | RESPIRATORY_TRACT | Status: DC
  Administered 2014-08-08 – 2014-08-14 (×13): 2 via RESPIRATORY_TRACT
  Filled 2014-08-08: qty 8.8

## 2014-08-08 MED ORDER — POTASSIUM CHLORIDE CRYS ER 20 MEQ PO TBCR
20.0000 meq | EXTENDED_RELEASE_TABLET | Freq: Every day | ORAL | Status: DC
  Administered 2014-08-09 – 2014-08-13 (×5): 20 meq via ORAL
  Filled 2014-08-08 (×5): qty 1

## 2014-08-08 MED ORDER — CHLORHEXIDINE GLUCONATE 4 % EX LIQD: 1.0000 "application " | Freq: Once | CUTANEOUS | Status: DC

## 2014-08-08 MED ORDER — MEPERIDINE HCL 25 MG/ML IJ SOLN: 6.2500 mg | INTRAMUSCULAR | Status: DC | PRN

## 2014-08-08 MED ORDER — OXYCODONE HCL 5 MG PO TABS: 5.0000 mg | ORAL_TABLET | Freq: Once | ORAL | Status: DC | PRN

## 2014-08-08 MED ORDER — LACTATED RINGERS IV SOLN
INTRAVENOUS | Status: DC
  Administered 2014-08-08 (×3): via INTRAVENOUS

## 2014-08-08 MED ORDER — 0.9 % SODIUM CHLORIDE (POUR BTL) OPTIME
TOPICAL | Status: DC | PRN
  Administered 2014-08-08 (×2): 1000 mL

## 2014-08-08 MED ORDER — ISOSORBIDE MONONITRATE ER 60 MG PO TB24
60.0000 mg | ORAL_TABLET | Freq: Every day | ORAL | Status: DC
  Administered 2014-08-09 – 2014-08-14 (×6): 60 mg via ORAL
  Filled 2014-08-08 (×7): qty 1

## 2014-08-08 MED ORDER — GLYCOPYRROLATE 0.2 MG/ML IJ SOLN
INTRAMUSCULAR | Status: AC
  Filled 2014-08-08: qty 3

## 2014-08-08 MED ORDER — HYDROMORPHONE HCL 1 MG/ML IJ SOLN
INTRAMUSCULAR | Status: AC
  Filled 2014-08-08: qty 1

## 2014-08-08 MED ORDER — POLYETHYLENE GLYCOL 3350 17 G PO PACK
17.0000 g | PACK | Freq: Two times a day (BID) | ORAL | Status: DC | PRN
  Administered 2014-08-09 – 2014-08-11 (×2): 17 g via ORAL
  Filled 2014-08-08 (×2): qty 1

## 2014-08-08 MED ORDER — PHENYLEPHRINE 40 MCG/ML (10ML) SYRINGE FOR IV PUSH (FOR BLOOD PRESSURE SUPPORT)
PREFILLED_SYRINGE | INTRAVENOUS | Status: AC
  Filled 2014-08-08: qty 20

## 2014-08-08 MED ORDER — OXYCODONE HCL 5 MG/5ML PO SOLN
5.0000 mg | Freq: Once | ORAL | Status: DC | PRN
Start: 2014-08-08 — End: 2014-08-08

## 2014-08-08 MED ORDER — PROPOFOL 10 MG/ML IV BOLUS
INTRAVENOUS | Status: DC | PRN
  Administered 2014-08-08: 300 mg via INTRAVENOUS

## 2014-08-08 MED ORDER — MIDAZOLAM HCL 2 MG/2ML IJ SOLN
INTRAMUSCULAR | Status: AC
  Filled 2014-08-08: qty 2

## 2014-08-08 MED ORDER — FLUTICASONE PROPIONATE 50 MCG/ACT NA SUSP
2.0000 | Freq: Every day | NASAL | Status: DC
  Administered 2014-08-09 – 2014-08-14 (×6): 2 via NASAL
  Filled 2014-08-08 (×2): qty 16

## 2014-08-08 MED ORDER — FENTANYL CITRATE (PF) 250 MCG/5ML IJ SOLN
INTRAMUSCULAR | Status: AC
  Filled 2014-08-08: qty 5

## 2014-08-08 MED ORDER — NEOSTIGMINE METHYLSULFATE 10 MG/10ML IV SOLN
INTRAVENOUS | Status: AC
  Filled 2014-08-08: qty 1

## 2014-08-08 MED ORDER — PROPOFOL 10 MG/ML IV BOLUS
INTRAVENOUS | Status: AC
  Filled 2014-08-08: qty 20

## 2014-08-08 MED ORDER — BUPIVACAINE-EPINEPHRINE (PF) 0.25% -1:200000 IJ SOLN
INTRAMUSCULAR | Status: AC
  Filled 2014-08-08: qty 30

## 2014-08-08 MED ORDER — ONDANSETRON HCL 4 MG/2ML IJ SOLN
INTRAMUSCULAR | Status: AC
  Filled 2014-08-08: qty 2

## 2014-08-08 MED ORDER — NEOSTIGMINE METHYLSULFATE 10 MG/10ML IV SOLN
INTRAVENOUS | Status: DC | PRN
  Administered 2014-08-08: 4 mg via INTRAVENOUS

## 2014-08-08 MED ORDER — PHENYLEPHRINE HCL 10 MG/ML IJ SOLN
INTRAMUSCULAR | Status: DC | PRN
  Administered 2014-08-08 (×3): 80 ug via INTRAVENOUS
  Administered 2014-08-08 (×2): 40 ug via INTRAVENOUS
  Administered 2014-08-08 (×2): 80 ug via INTRAVENOUS
  Administered 2014-08-08 (×2): 40 ug via INTRAVENOUS

## 2014-08-08 MED ORDER — PHENYLEPHRINE 40 MCG/ML (10ML) SYRINGE FOR IV PUSH (FOR BLOOD PRESSURE SUPPORT)
PREFILLED_SYRINGE | INTRAVENOUS | Status: AC
  Filled 2014-08-08: qty 10

## 2014-08-08 MED ORDER — ROCURONIUM BROMIDE 100 MG/10ML IV SOLN: INTRAVENOUS | Status: DC | PRN

## 2014-08-08 MED ORDER — MIDAZOLAM HCL 5 MG/5ML IJ SOLN
INTRAMUSCULAR | Status: DC | PRN
  Administered 2014-08-08 (×2): 2 mg via INTRAVENOUS

## 2014-08-08 MED ORDER — ENOXAPARIN SODIUM 40 MG/0.4ML ~~LOC~~ SOLN
40.0000 mg | SUBCUTANEOUS | Status: DC
  Administered 2014-08-09 – 2014-08-11 (×3): 40 mg via SUBCUTANEOUS
  Filled 2014-08-08 (×3): qty 0.4

## 2014-08-08 MED ORDER — ONDANSETRON HCL 4 MG/2ML IJ SOLN
4.0000 mg | Freq: Four times a day (QID) | INTRAMUSCULAR | Status: DC | PRN
  Administered 2014-08-10 – 2014-08-14 (×3): 4 mg via INTRAVENOUS
  Filled 2014-08-08 (×3): qty 2

## 2014-08-08 MED ORDER — CALCIUM CARBONATE ANTACID 500 MG PO CHEW
500.0000 mg | CHEWABLE_TABLET | Freq: Three times a day (TID) | ORAL | Status: DC | PRN
  Administered 2014-08-10: 500 mg via ORAL
  Filled 2014-08-08: qty 1
  Filled 2014-08-08: qty 3

## 2014-08-08 MED ORDER — ONDANSETRON HCL 4 MG PO TABS: 4.0000 mg | ORAL_TABLET | Freq: Four times a day (QID) | ORAL | Status: DC | PRN

## 2014-08-08 MED ORDER — OXYCODONE-ACETAMINOPHEN 5-325 MG PO TABS
1.0000 | ORAL_TABLET | ORAL | Status: DC | PRN
  Administered 2014-08-08 – 2014-08-09 (×5): 2 via ORAL
  Filled 2014-08-08 (×2): qty 2
  Filled 2014-08-08: qty 1
  Filled 2014-08-08 (×2): qty 2

## 2014-08-08 MED ORDER — ALBUTEROL SULFATE (2.5 MG/3ML) 0.083% IN NEBU
INHALATION_SOLUTION | RESPIRATORY_TRACT | Status: AC
  Filled 2014-08-08: qty 3

## 2014-08-08 MED ORDER — ALBUTEROL SULFATE HFA 108 (90 BASE) MCG/ACT IN AERS
INHALATION_SPRAY | RESPIRATORY_TRACT | Status: AC
  Filled 2014-08-08: qty 6.7

## 2014-08-08 MED ORDER — ALPRAZOLAM 0.5 MG PO TABS
0.5000 mg | ORAL_TABLET | Freq: Three times a day (TID) | ORAL | Status: DC
  Administered 2014-08-08 – 2014-08-14 (×20): 0.5 mg via ORAL
  Filled 2014-08-08 (×19): qty 1

## 2014-08-08 MED ORDER — FENTANYL CITRATE (PF) 100 MCG/2ML IJ SOLN
INTRAMUSCULAR | Status: DC | PRN
  Administered 2014-08-08: 50 ug via INTRAVENOUS
  Administered 2014-08-08: 25 ug via INTRAVENOUS
  Administered 2014-08-08 (×2): 100 ug via INTRAVENOUS
  Administered 2014-08-08: 25 ug via INTRAVENOUS

## 2014-08-08 MED ORDER — ALBUTEROL SULFATE (2.5 MG/3ML) 0.083% IN NEBU: 2.5000 mg | INHALATION_SOLUTION | Freq: Four times a day (QID) | RESPIRATORY_TRACT | Status: DC | PRN

## 2014-08-08 MED ORDER — KETOROLAC TROMETHAMINE 30 MG/ML IJ SOLN
30.0000 mg | Freq: Once | INTRAMUSCULAR | Status: AC
  Administered 2014-08-08: 30 mg via INTRAVENOUS
  Filled 2014-08-08: qty 1

## 2014-08-08 MED ORDER — HYDROMORPHONE HCL 1 MG/ML IJ SOLN
1.0000 mg | INTRAMUSCULAR | Status: DC | PRN
  Administered 2014-08-08 – 2014-08-12 (×37): 1 mg via INTRAVENOUS
  Filled 2014-08-08 (×34): qty 1

## 2014-08-08 MED ORDER — LISINOPRIL 10 MG PO TABS
10.0000 mg | ORAL_TABLET | Freq: Every day | ORAL | Status: DC
  Administered 2014-08-09 – 2014-08-14 (×6): 10 mg via ORAL
  Filled 2014-08-08 (×6): qty 1

## 2014-08-08 MED ORDER — ROCURONIUM BROMIDE 50 MG/5ML IV SOLN
INTRAVENOUS | Status: AC
  Filled 2014-08-08: qty 1

## 2014-08-08 MED ORDER — GLYCOPYRROLATE 0.2 MG/ML IJ SOLN
INTRAMUSCULAR | Status: DC | PRN
  Administered 2014-08-08: 0.6 mg via INTRAVENOUS

## 2014-08-08 MED ORDER — ROSUVASTATIN CALCIUM 20 MG PO TABS
20.0000 mg | ORAL_TABLET | Freq: Every day | ORAL | Status: DC
  Administered 2014-08-09 – 2014-08-14 (×6): 20 mg via ORAL
  Filled 2014-08-08 (×8): qty 1

## 2014-08-08 MED ORDER — ONDANSETRON HCL 4 MG/2ML IJ SOLN
INTRAMUSCULAR | Status: DC | PRN
Start: 2014-08-08 — End: 2014-08-08
  Administered 2014-08-08: 4 mg via INTRAVENOUS

## 2014-08-08 MED ORDER — HYDROXYZINE PAMOATE 50 MG PO CAPS
50.0000 mg | ORAL_CAPSULE | Freq: Four times a day (QID) | ORAL | Status: DC | PRN
  Filled 2014-08-08: qty 1

## 2014-08-08 MED ORDER — LIDOCAINE HCL (CARDIAC) 20 MG/ML IV SOLN
INTRAVENOUS | Status: AC
  Filled 2014-08-08: qty 5

## 2014-08-08 MED ORDER — BUPIVACAINE-EPINEPHRINE (PF) 0.25% -1:200000 IJ SOLN
INTRAMUSCULAR | Status: AC
Start: 2014-08-08 — End: 2014-08-08
  Filled 2014-08-08: qty 30

## 2014-08-08 MED ORDER — ROCURONIUM BROMIDE 100 MG/10ML IV SOLN
INTRAVENOUS | Status: DC | PRN
  Administered 2014-08-08: 10 mg via INTRAVENOUS
  Administered 2014-08-08: 40 mg via INTRAVENOUS

## 2014-08-08 MED ORDER — CEFAZOLIN SODIUM-DEXTROSE 2-3 GM-% IV SOLR
INTRAVENOUS | Status: DC | PRN
  Administered 2014-08-08: 3 g via INTRAVENOUS

## 2014-08-08 MED ORDER — OXYCODONE-ACETAMINOPHEN 5-325 MG PO TABS
ORAL_TABLET | ORAL | Status: AC
  Filled 2014-08-08: qty 2

## 2014-08-08 MED ORDER — ALBUTEROL SULFATE (2.5 MG/3ML) 0.083% IN NEBU
2.5000 mg | INHALATION_SOLUTION | Freq: Once | RESPIRATORY_TRACT | Status: AC
  Administered 2014-08-08: 2.5 mg via RESPIRATORY_TRACT

## 2014-08-08 MED ORDER — SUCCINYLCHOLINE CHLORIDE 20 MG/ML IJ SOLN
INTRAMUSCULAR | Status: DC | PRN
  Administered 2014-08-08: 100 mg via INTRAVENOUS

## 2014-08-08 MED ORDER — BUPIVACAINE-EPINEPHRINE 0.25% -1:200000 IJ SOLN
INTRAMUSCULAR | Status: DC | PRN
  Administered 2014-08-08 (×2): 30 mL

## 2014-08-08 MED ORDER — ALPRAZOLAM 0.25 MG PO TABS
ORAL_TABLET | ORAL | Status: AC
  Filled 2014-08-08: qty 2

## 2014-08-08 MED ORDER — ASPIRIN EC 81 MG PO TBEC
81.0000 mg | DELAYED_RELEASE_TABLET | Freq: Every day | ORAL | Status: DC
  Administered 2014-08-09 – 2014-08-14 (×6): 81 mg via ORAL
  Filled 2014-08-08 (×6): qty 1

## 2014-08-08 MED ORDER — HYDROMORPHONE HCL 1 MG/ML IJ SOLN
INTRAMUSCULAR | Status: AC
  Administered 2014-08-08: 1 mg via INTRAVENOUS
  Filled 2014-08-08: qty 1

## 2014-08-08 MED ORDER — FUROSEMIDE 40 MG PO TABS
40.0000 mg | ORAL_TABLET | Freq: Every day | ORAL | Status: DC
  Administered 2014-08-09 – 2014-08-14 (×6): 40 mg via ORAL
  Filled 2014-08-08 (×6): qty 1

## 2014-08-08 MED ORDER — DEXTROSE 5 % IV SOLN
3.0000 g | Freq: Three times a day (TID) | INTRAVENOUS | Status: AC
  Administered 2014-08-08 – 2014-08-09 (×3): 3 g via INTRAVENOUS
  Filled 2014-08-08 (×5): qty 3000

## 2014-08-08 MED ORDER — HYDROMORPHONE HCL 1 MG/ML IJ SOLN
0.2500 mg | INTRAMUSCULAR | Status: DC | PRN
  Administered 2014-08-08 (×4): 0.5 mg via INTRAVENOUS

## 2014-08-08 MED ORDER — LIDOCAINE HCL 1 % IJ SOLN
INTRAMUSCULAR | Status: DC | PRN
  Administered 2014-08-08: 100 mg via INTRADERMAL

## 2014-08-08 MED ORDER — LIDOCAINE HCL (CARDIAC) 20 MG/ML IV SOLN
INTRAVENOUS | Status: AC
  Filled 2014-08-08: qty 15

## 2014-08-08 MED ORDER — SODIUM CHLORIDE 0.9 % IR SOLN: Status: DC | PRN

## 2014-08-08 MED ORDER — LIDOCAINE HCL (CARDIAC) 20 MG/ML IV SOLN
INTRAVENOUS | Status: DC | PRN
  Administered 2014-08-08 (×2): 100 mg via INTRAVENOUS

## 2014-08-08 MED ORDER — POTASSIUM CHLORIDE IN NACL 20-0.9 MEQ/L-% IV SOLN
INTRAVENOUS | Status: DC
  Administered 2014-08-08 – 2014-08-10 (×6): via INTRAVENOUS
  Filled 2014-08-08 (×8): qty 1000

## 2014-08-08 SURGICAL SUPPLY — 51 items
ADH SKN CLS APL DERMABOND .7 (GAUZE/BANDAGES/DRESSINGS) ×2
APPLIER CLIP LOGIC TI 5 (MISCELLANEOUS) IMPLANT
APR CLP MED LRG 33X5 (MISCELLANEOUS)
BINDER ABDOMINAL 12 ML 46-62 (SOFTGOODS) ×2 IMPLANT
BLADE SURG ROTATE 9660 (MISCELLANEOUS) ×2 IMPLANT
CANISTER SUCTION 2500CC (MISCELLANEOUS) IMPLANT
CHLORAPREP W/TINT 26ML (MISCELLANEOUS) ×4 IMPLANT
COVER SURGICAL LIGHT HANDLE (MISCELLANEOUS) ×4 IMPLANT
DECANTER SPIKE VIAL GLASS SM (MISCELLANEOUS) ×4 IMPLANT
DERMABOND ADVANCED (GAUZE/BANDAGES/DRESSINGS) ×2
DERMABOND ADVANCED .7 DNX12 (GAUZE/BANDAGES/DRESSINGS) ×2 IMPLANT
DEVICE SECURE STRAP 25 ABSORB (INSTRUMENTS) ×6 IMPLANT
DEVICE TROCAR PUNCTURE CLOSURE (ENDOMECHANICALS) ×4 IMPLANT
DRAPE LAPAROSCOPIC ABDOMINAL (DRAPES) ×4 IMPLANT
DRAPE UTILITY XL STRL (DRAPES) ×2 IMPLANT
ELECT REM PT RETURN 9FT ADLT (ELECTROSURGICAL) ×4
ELECTRODE REM PT RTRN 9FT ADLT (ELECTROSURGICAL) ×2 IMPLANT
GLOVE BIO SURGEON STRL SZ 6.5 (GLOVE) ×1 IMPLANT
GLOVE BIO SURGEON STRL SZ7 (GLOVE) ×4 IMPLANT
GLOVE BIO SURGEON STRL SZ7.5 (GLOVE) ×6 IMPLANT
GLOVE BIO SURGEONS STRL SZ 6.5 (GLOVE) ×1
GLOVE BIOGEL PI IND STRL 7.0 (GLOVE) IMPLANT
GLOVE BIOGEL PI INDICATOR 7.0 (GLOVE) ×4
GLOVE EUDERMIC 7 POWDERFREE (GLOVE) ×4 IMPLANT
GOWN STRL REUS W/ TWL LRG LVL3 (GOWN DISPOSABLE) ×4 IMPLANT
GOWN STRL REUS W/ TWL XL LVL3 (GOWN DISPOSABLE) ×2 IMPLANT
GOWN STRL REUS W/TWL LRG LVL3 (GOWN DISPOSABLE) ×8
GOWN STRL REUS W/TWL XL LVL3 (GOWN DISPOSABLE) ×4
KIT BASIN OR (CUSTOM PROCEDURE TRAY) ×4 IMPLANT
KIT ROOM TURNOVER OR (KITS) ×4 IMPLANT
MARKER SKIN DUAL TIP RULER LAB (MISCELLANEOUS) ×4 IMPLANT
MESH VENTRALIGHT ST 8IN CRC (Mesh General) ×2 IMPLANT
NDL SPNL 22GX3.5 QUINCKE BK (NEEDLE) ×2 IMPLANT
NEEDLE SPNL 22GX3.5 QUINCKE BK (NEEDLE) ×4 IMPLANT
NS IRRIG 1000ML POUR BTL (IV SOLUTION) ×4 IMPLANT
PAD ARMBOARD 7.5X6 YLW CONV (MISCELLANEOUS) ×8 IMPLANT
SCALPEL HARMONIC ACE (MISCELLANEOUS) IMPLANT
SCISSORS LAP 5X35 DISP (ENDOMECHANICALS) ×2 IMPLANT
SET IRRIG TUBING LAPAROSCOPIC (IRRIGATION / IRRIGATOR) IMPLANT
SLEEVE ENDOPATH XCEL 5M (ENDOMECHANICALS) ×8 IMPLANT
SLEEVE SURGEON STRL (DRAPES) ×2 IMPLANT
SUT MNCRL AB 4-0 PS2 18 (SUTURE) ×4 IMPLANT
SUT MON AB 4-0 PC3 18 (SUTURE) ×4 IMPLANT
SUT NOVA NAB DX-16 0-1 5-0 T12 (SUTURE) ×6 IMPLANT
TOWEL OR 17X24 6PK STRL BLUE (TOWEL DISPOSABLE) ×4 IMPLANT
TOWEL OR 17X26 10 PK STRL BLUE (TOWEL DISPOSABLE) ×4 IMPLANT
TRAY FOLEY CATH 16FR SILVER (SET/KITS/TRAYS/PACK) ×2 IMPLANT
TRAY LAPAROSCOPIC (CUSTOM PROCEDURE TRAY) ×4 IMPLANT
TROCAR XCEL NON-BLD 11X100MML (ENDOMECHANICALS) ×4 IMPLANT
TROCAR XCEL NON-BLD 5MMX100MML (ENDOMECHANICALS) ×4 IMPLANT
TUBING INSUFFLATION (TUBING) ×4 IMPLANT

## 2014-08-08 NOTE — Anesthesia Postprocedure Evaluation (Signed)
  Anesthesia Post-op Note  Patient: [name] [name]  Procedure(s) Performed: Procedure(s): LAPAROSCOPIC REPAIR  INCISIONAL HERNIA  (N/A) INSERTION OF MESH (N/A)  Patient Location: PACU  Anesthesia Type:General  Level of Consciousness: awake, alert , oriented and patient cooperative  Airway and Oxygen Therapy: Patient Spontanous Breathing and Patient connected to face mask oxygen  Post-op Pain: mild  Post-op Assessment: Post-op Vital signs reviewed, Patient's Cardiovascular Status Stable, Respiratory Function Stable, Patent Airway, No signs of Nausea or vomiting and Pain level controlled  Post-op Vital Signs: stable  Last Vitals:  Filed Vitals:   08/08/14 1200  BP: 127/73  Pulse: 105  Temp:   Resp: 17    Complications: No apparent anesthesia complications

## 2014-08-08 NOTE — Progress Notes (Signed)
Arrived from PACU, oriented to room and surroundings, denies nausea/ c/o abd pain 6/10, pt very demanding at this time.

## 2014-08-08 NOTE — Anesthesia Procedure Notes (Signed)
Procedure Name: Intubation Date/Time: 08/08/2014 9:39 AM Performed by: Gershon Mussel, Kahealani Yankovich Pre-anesthesia Checklist: Patient identified, Patient being monitored, Timeout performed, Emergency Drugs available and Suction available Patient Re-evaluated:Patient Re-evaluated prior to inductionOxygen Delivery Method: Circle System Utilized Preoxygenation: Pre-oxygenation with 100% oxygen Intubation Type: IV induction Ventilation: Mask ventilation without difficulty Laryngoscope Size: 2 and Miller Grade View: Grade II Tube type: Oral Tube size: 7.0 mm Number of attempts: 1 Airway Equipment and Method: Stylet Placement Confirmation: ETT inserted through vocal cords under direct vision,  positive ETCO2 and breath sounds checked- equal and bilateral Secured at: 21 cm Tube secured with: Tape Dental Injury: Teeth and Oropharynx as per pre-operative assessment
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
## 2014-08-08 NOTE — Anesthesia Preprocedure Evaluation (Addendum)
Anesthesia Evaluation  Patient identified by MRN, date of birth, ID band Patient awake    Reviewed: Allergy & Precautions, NPO status , Patient's Chart, lab work & pertinent test results  Airway Mallampati: II  TM Distance: >3 FB Neck ROM: Full  Mouth opening: Limited Mouth Opening  Dental  (+) Teeth Intact, Dental Advisory Given   Pulmonary COPDCurrent Smoker,  breath sounds clear to auscultation+ rhonchi   + decreased breath sounds+ wheezing      Cardiovascular hypertension, Pt. on medications + CAD Rhythm:Regular Rate:Tachycardia     Neuro/Psych    GI/Hepatic GERD-  Medicated and Controlled,  Endo/Other    Renal/GU      Musculoskeletal   Abdominal   Peds  Hematology   Anesthesia Other Findings   Reproductive/Obstetrics                           Anesthesia Physical Anesthesia Plan  ASA: III  Anesthesia Plan: General   Post-op Pain Management:    Induction: Intravenous  Airway Management Planned: Oral ETT  Additional Equipment:   Intra-op Plan:   Post-operative Plan: Extubation in OR  Informed Consent: I have reviewed the patients History and Physical, chart, labs and discussed the procedure including the risks, benefits and alternatives for the proposed anesthesia with the patient or authorized representative who has indicated his/her understanding and acceptance.   Dental advisory given  Plan Discussed with: CRNA, Anesthesiologist and Surgeon  Anesthesia Plan Comments:         Anesthesia Quick Evaluation

## 2014-08-08 NOTE — Transfer of Care (Signed)
Immediate Anesthesia Transfer of Care Note  Patient: [name] [name]  Procedure(s) Performed: Procedure(s): LAPAROSCOPIC REPAIR  INCISIONAL HERNIA  (N/A) INSERTION OF MESH (N/A)  Patient Location: PACU  Anesthesia Type:General  Level of Consciousness: awake and alert   Airway & Oxygen Therapy: Patient Spontanous Breathing and Patient connected to face mask oxygen  Post-op Assessment: VSS- Non rebreather applied  Post vital signs: Reviewed and stable  Last Vitals:  Filed Vitals:   08/08/14 0805  BP: 110/63  Pulse: 97  Temp: 37.1 C  Resp: 20    Complications: No apparent anesthesia complications

## 2014-08-08 NOTE — Interval H&P Note (Signed)
History and Physical Interval Note:  08/08/2014 8:58 AM  [name] [name]  has presented today for surgery, with the diagnosis of incisional hernia  The various methods of treatment have been discussed with the patient and family. After consideration of risks, benefits and other options for treatment, the patient has consented to  Procedure(s): [place] (N/A) INSERTION OF MESH (N/A) as a surgical intervention .  The patient's history has been reviewed, patient examined, no change in status, stable for surgery.  I have reviewed the patient's chart and labs.  Questions were answered to the patient's satisfaction.     Adin Hector

## 2014-08-09 LAB — CBC
HCT: 35.4 % — ABNORMAL LOW (ref 39.0–52.0)
Hemoglobin: 12 g/dL — ABNORMAL LOW (ref 13.0–17.0)
MCH: 32 pg (ref 26.0–34.0)
MCHC: 33.9 g/dL (ref 30.0–36.0)
MCV: 94.4 fL (ref 78.0–100.0)
PLATELETS: 253 10*3/uL (ref 150–400)
RBC: 3.75 MIL/uL — AB (ref 4.22–5.81)
RDW: 12.4 % (ref 11.5–15.5)
WBC: 11.6 10*3/uL — ABNORMAL HIGH (ref 4.0–10.5)

## 2014-08-09 LAB — BASIC METABOLIC PANEL
Anion gap: 8 (ref 5–15)
BUN: 14 mg/dL (ref 6–20)
CHLORIDE: 99 mmol/L — AB (ref 101–111)
CO2: 25 mmol/L (ref 22–32)
CREATININE: 1.36 mg/dL — AB (ref 0.61–1.24)
Calcium: 8.5 mg/dL — ABNORMAL LOW (ref 8.9–10.3)
GFR calc Af Amer: >60 mL/min (ref >60)
GFR calc non Af Amer: 56 mL/min — ABNORMAL LOW (ref >60)
Glucose, Bld: 101 mg/dL — ABNORMAL HIGH (ref 65–99)
Potassium: 4.9 mmol/L (ref 3.5–5.1)
Sodium: 132 mmol/L — ABNORMAL LOW (ref 135–145)

## 2014-08-09 MED ORDER — CLOPIDOGREL BISULFATE 75 MG PO TABS
75.0000 mg | ORAL_TABLET | Freq: Every day | ORAL | Status: DC
  Administered 2014-08-09 – 2014-08-10 (×2): 75 mg via ORAL
  Filled 2014-08-09 (×2): qty 1

## 2014-08-09 MED ORDER — KETOROLAC TROMETHAMINE 30 MG/ML IJ SOLN
30.0000 mg | Freq: Four times a day (QID) | INTRAMUSCULAR | Status: AC
  Administered 2014-08-09 (×2): 30 mg via INTRAVENOUS
  Filled 2014-08-09 (×2): qty 1

## 2014-08-09 MED ORDER — DOCUSATE SODIUM 100 MG PO CAPS
100.0000 mg | ORAL_CAPSULE | Freq: Two times a day (BID) | ORAL | Status: DC | PRN
  Administered 2014-08-09 – 2014-08-10 (×2): 100 mg via ORAL
  Filled 2014-08-09 (×2): qty 1

## 2014-08-09 MED ORDER — MORPHINE SULFATE 15 MG PO TABS
30.0000 mg | ORAL_TABLET | ORAL | Status: DC | PRN
  Administered 2014-08-09 – 2014-08-12 (×13): 30 mg via ORAL
  Filled 2014-08-09 (×13): qty 2

## 2014-08-09 MED ORDER — MENTHOL 3 MG MT LOZG
1.0000 | LOZENGE | OROMUCOSAL | Status: DC | PRN
  Administered 2014-08-09 – 2014-08-10 (×3): 3 mg via ORAL
  Filled 2014-08-09 (×4): qty 9

## 2014-08-09 NOTE — Progress Notes (Signed)
Patient's MRSA PCR came back negative. Patient taken off of contact isolation. Jimmie Molly, RN

## 2014-08-09 NOTE — Progress Notes (Addendum)
Patient has called out as well as yelled out all night asking for IV dilaudid. He has received 1 mg IV dilaudid every hour during this shift as well as 2 percocet every 4 hours. Patient still stating pain 10/10 each time ask. Patient was also given a 1 time order of 30 mg IV Toradol at around 9 pm last night. Will continue to monitor. Bertil Brickey K, RN   Patient has continued calling out throughout night, most of the time he is sleeping by the time I get back with pain medication. I have not woken him to give it to him however he is very upset when he wakes because he has not had it yet. Patient did state that the Toradol helped. Jimmie Molly, RN

## 2014-08-09 NOTE — Progress Notes (Signed)
Patient ID: [name] [name], male   DOB: 1956-01-30, 59 y.o.   MRN: [id number] [facility] Surgery Progress Note:   1 Day Post-Op  Subjective: Mental status is alert.  Sitting up with abdominal binder in place Objective: Vital signs in last 24 hours: Temp:  [97.9 F (36.6 C)-99 F (37.2 C)] 98.4 F (36.9 C) (06/04 0609) Pulse Rate:  [84-107] 85 (06/04 0609) Resp:  [12-26] 16 (06/04 0609) BP: (105-138)/(50-82) 138/71 mmHg (06/04 0609) SpO2:  [85 %-97 %] 96 % (06/04 0919) Weight:  [121 kg (266 lb 12.1 oz)] 121 kg (266 lb 12.1 oz) (06/03 1633)  Intake/Output from previous day: 06/03 0701 - 06/04 0700 In: 1500 [I.V.:1500] Out: 575 [Urine:550; Blood:25] Intake/Output this shift:    Physical Exam: Work of breathing is not labored.  Incisions are partially covered by abdominal binder.  Incisions bland  Lab Results:  Results for orders placed or performed during the hospital encounter of 08/08/14 (from the past 48 hour(s))  APTT     Status: None   Collection Time: 08/08/14  7:58 AM  Result Value Ref Range   aPTT 30 24 - 37 seconds  CBC WITH DIFFERENTIAL     Status: Abnormal   Collection Time: 08/08/14  7:58 AM  Result Value Ref Range   WBC 12.7 (H) 4.0 - 10.5 K/uL   RBC 3.91 (L) 4.22 - 5.81 MIL/uL   Hemoglobin 12.8 (L) 13.0 - 17.0 g/dL   HCT 36.6 (L) 39.0 - 52.0 %   MCV 93.6 78.0 - 100.0 fL   MCH 32.7 26.0 - 34.0 pg   MCHC 35.0 30.0 - 36.0 g/dL   RDW 12.7 11.5 - 15.5 %   Platelets 280 150 - 400 K/uL   Neutrophils Relative % 78 (H) 43 - 77 %   Neutro Abs 9.8 (H) 1.7 - 7.7 K/uL   Lymphocytes Relative 14 12 - 46 %   Lymphs Abs 1.8 0.7 - 4.0 K/uL   Monocytes Relative 6 3 - 12 %   Monocytes Absolute 0.8 0.1 - 1.0 K/uL   Eosinophils Relative 2 0 - 5 %   Eosinophils Absolute 0.3 0.0 - 0.7 K/uL   Basophils Relative 0 0 - 1 %   Basophils Absolute 0.0 0.0 - 0.1 K/uL  Comprehensive metabolic panel     Status: Abnormal   Collection Time: 08/08/14  7:58 AM  Result Value Ref Range    Sodium 131 (L) 135 - 145 mmol/L   Potassium 4.7 3.5 - 5.1 mmol/L    Comment: SPECIMEN HEMOLYZED. HEMOLYSIS MAY AFFECT INTEGRITY OF RESULTS.   Chloride 97 (L) 101 - 111 mmol/L   CO2 23 22 - 32 mmol/L   Glucose, Bld 108 (H) 65 - 99 mg/dL   BUN 21 (H) 6 - 20 mg/dL   Creatinine, Ser 1.62 (H) 0.61 - 1.24 mg/dL   Calcium 8.9 8.9 - 10.3 mg/dL   Total Protein 6.5 6.5 - 8.1 g/dL   Albumin 3.8 3.5 - 5.0 g/dL   AST 24 15 - 41 U/L   ALT 16 (L) 17 - 63 U/L   Alkaline Phosphatase 67 38 - 126 U/L   Total Bilirubin 1.2 0.3 - 1.2 mg/dL   GFR calc non Af Amer 45 (L) >60 mL/min   GFR calc Af Amer 52 (L) >60 mL/min    Comment: (NOTE) The eGFR has been calculated using the CKD EPI equation. This calculation has not been validated in all clinical situations. eGFR's persistently <60 mL/min signify  possible Chronic Kidney Disease.    Anion gap 11 5 - 15  Protime-INR     Status: None   Collection Time: 08/08/14  7:58 AM  Result Value Ref Range   Prothrombin Time 13.7 11.6 - 15.2 seconds   INR 1.03 0.00 - 1.49  Surgical pcr screen     Status: None   Collection Time: 08/08/14  4:56 PM  Result Value Ref Range   MRSA, PCR NEGATIVE NEGATIVE   Staphylococcus aureus NEGATIVE NEGATIVE    Comment:        The Xpert SA Assay (FDA approved for NASAL specimens in patients over 41 years of age), is one component of a comprehensive surveillance program.  Test performance has been validated by [facility] for patients greater than or equal to 38 year old. It is not intended to diagnose infection nor to guide or monitor treatment.   Basic metabolic panel     Status: Abnormal   Collection Time: 08/09/14  4:44 AM  Result Value Ref Range   Sodium 132 (L) 135 - 145 mmol/L   Potassium 4.9 3.5 - 5.1 mmol/L   Chloride 99 (L) 101 - 111 mmol/L   CO2 25 22 - 32 mmol/L   Glucose, Bld 101 (H) 65 - 99 mg/dL   BUN 14 6 - 20 mg/dL   Creatinine, Ser 1.36 (H) 0.61 - 1.24 mg/dL   Calcium 8.5 (L) 8.9 - 10.3 mg/dL    GFR calc non Af Amer 56 (L) >60 mL/min   GFR calc Af Amer >60 >60 mL/min    Comment: (NOTE) The eGFR has been calculated using the CKD EPI equation. This calculation has not been validated in all clinical situations. eGFR's persistently <60 mL/min signify possible Chronic Kidney Disease.    Anion gap 8 5 - 15  CBC     Status: Abnormal   Collection Time: 08/09/14  4:44 AM  Result Value Ref Range   WBC 11.6 (H) 4.0 - 10.5 K/uL   RBC 3.75 (L) 4.22 - 5.81 MIL/uL   Hemoglobin 12.0 (L) 13.0 - 17.0 g/dL   HCT 35.4 (L) 39.0 - 52.0 %   MCV 94.4 78.0 - 100.0 fL   MCH 32.0 26.0 - 34.0 pg   MCHC 33.9 30.0 - 36.0 g/dL   RDW 12.4 11.5 - 15.5 %   Platelets 253 150 - 400 K/uL    Radiology/Results: Chest 2 View  08/08/2014   CLINICAL DATA:  Preoperative for umbilical hernia repair. Hypertension. Three-week history of cough  EXAM: CHEST  2 VIEW  COMPARISON:  Chest radiograph January 21, 2009; chest CT April 17, 2008  FINDINGS: There is subsegmental atelectasis in the left base. Lungs are otherwise clear. Heart size and pulmonary vascularity are normal. No adenopathy. No bone lesions.  IMPRESSION: Subsegmental atelectasis left base.  Lungs elsewhere clear.   Electronically Signed   By: Lowella Grip III M.D.   On: 08/08/2014 07:53    Anti-infectives: Anti-infectives    Start     Dose/Rate Route Frequency Ordered Stop   08/08/14 1700  ceFAZolin (ANCEF) 3 g in dextrose 5 % 50 mL IVPB     3 g 160 mL/hr over 30 Minutes Intravenous 3 times per day 08/08/14 1632 08/09/14 0614   08/08/14 0900  ceFAZolin (ANCEF) 3 g in dextrose 5 % 50 mL IVPB  Status:  Discontinued     3 g 160 mL/hr over 30 Minutes Intravenous To Surgery 08/07/14 0915 08/08/14 1620  Assessment/Plan: Problem List: Patient Active Problem List   Diagnosis Date Noted  . Incarcerated incisional hernia 08/08/2014  . Pre-operative cardiovascular examination 06/29/2014  . Bilateral lower extremity edema 06/13/2013  . Chest  pain with moderate risk for cardiac etiology 09/30/2012  . Tobacco abuse counseling 09/30/2012  . CAD S/P percutaneous coronary angioplasty   . Hypertension, essential, benign   . Obesity, Class II, BMI 35-39.9   . Dyslipidemia, goal LDL below 70   . Tobacco abuse - counseling provided   . OSTEOARTHRITIS 12/25/2006  . HIP PAIN, BILATERAL 11/15/2006    Pain control issues because of underlying fibromyalgia and morphine dependence.  Will restart Plavix  1 Day Post-Op      Matt B. Hassell Done, MD, [facility] [phone] beeper [phone]  08/09/2014 9:44 AM

## 2014-08-10 ENCOUNTER — Ambulatory Visit (HOSPITAL_COMMUNITY): Payer: PPO

## 2014-08-10 LAB — CBC WITH DIFFERENTIAL/PLATELET
Basophils Absolute: 0 10*3/uL (ref 0.0–0.1)
Basophils Relative: 0 % (ref 0–1)
EOS PCT: 0 % (ref 0–5)
Eosinophils Absolute: 0.1 10*3/uL (ref 0.0–0.7)
HEMATOCRIT: 36.4 % — AB (ref 39.0–52.0)
HEMOGLOBIN: 12.4 g/dL — AB (ref 13.0–17.0)
LYMPHS PCT: 7 % — AB (ref 12–46)
Lymphs Abs: 1.2 10*3/uL (ref 0.7–4.0)
MCH: 32.4 pg (ref 26.0–34.0)
MCHC: 34.1 g/dL (ref 30.0–36.0)
MCV: 95 fL (ref 78.0–100.0)
MONOS PCT: 6 % (ref 3–12)
Monocytes Absolute: 1.1 10*3/uL — ABNORMAL HIGH (ref 0.1–1.0)
NEUTROS PCT: 87 % — AB (ref 43–77)
Neutro Abs: 14.8 10*3/uL — ABNORMAL HIGH (ref 1.7–7.7)
Platelets: 280 10*3/uL (ref 150–400)
RBC: 3.83 MIL/uL — AB (ref 4.22–5.81)
RDW: 12.6 % (ref 11.5–15.5)
WBC: 17.2 10*3/uL — ABNORMAL HIGH (ref 4.0–10.5)

## 2014-08-10 LAB — BASIC METABOLIC PANEL
Anion gap: 9 (ref 5–15)
BUN: 11 mg/dL (ref 6–20)
CHLORIDE: 102 mmol/L (ref 101–111)
CO2: 23 mmol/L (ref 22–32)
Calcium: 8.8 mg/dL — ABNORMAL LOW (ref 8.9–10.3)
Creatinine, Ser: 1.22 mg/dL (ref 0.61–1.24)
GFR calc non Af Amer: >60 mL/min (ref >60)
Glucose, Bld: 107 mg/dL — ABNORMAL HIGH (ref 65–99)
Potassium: 5.3 mmol/L — ABNORMAL HIGH (ref 3.5–5.1)
SODIUM: 134 mmol/L — AB (ref 135–145)

## 2014-08-10 IMAGING — DX DG CHEST 2V
2 series · 2 of 2 positions shown · non-contrast
Comparison: [DATE]

CLINICAL DATA: Post hernia repair [DATE], having fever and
chills, history hypertension, pneumonia, COPD, coronary artery
disease post PTCA, smoker

EXAM:
CHEST  2 VIEW

[chest lat]
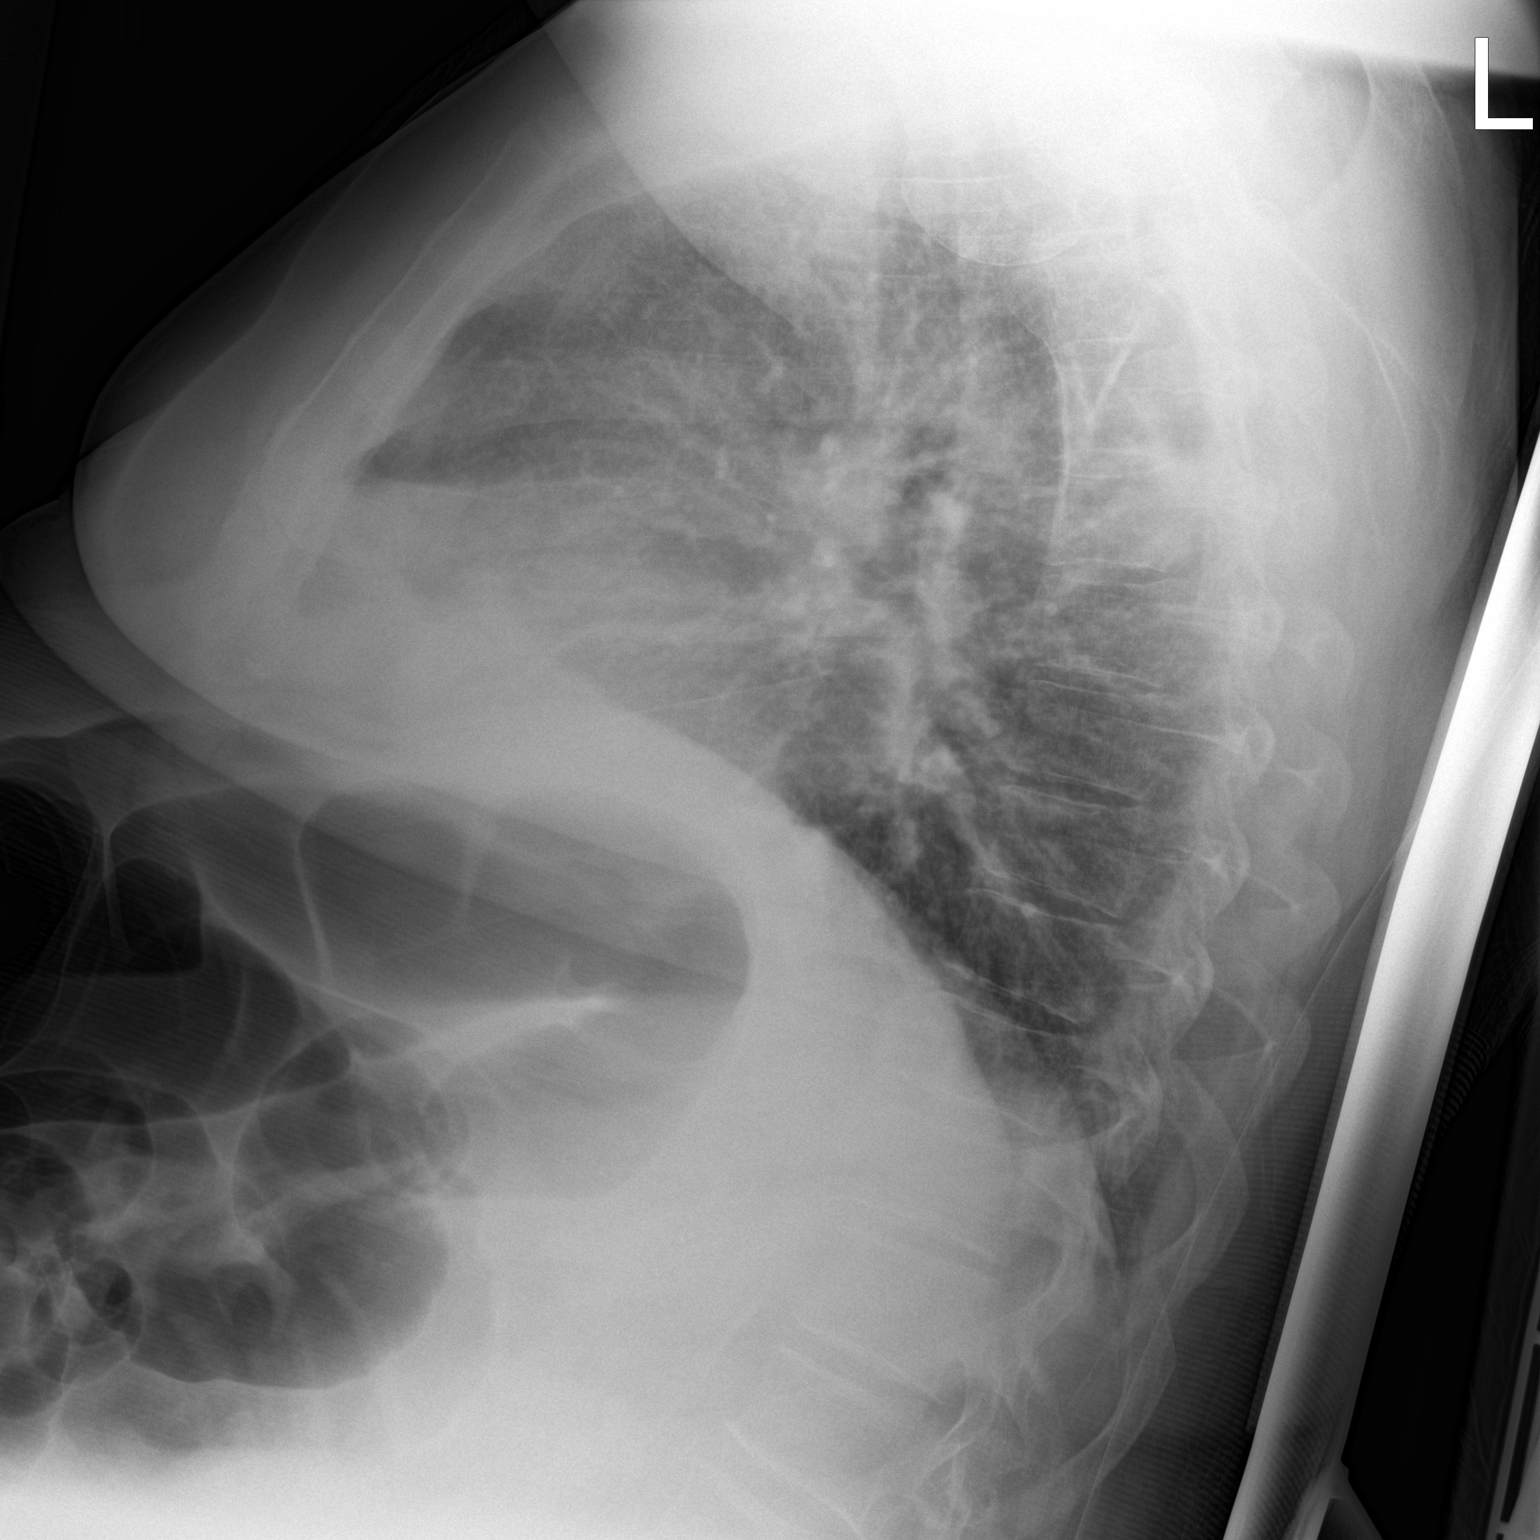

[chest ap]
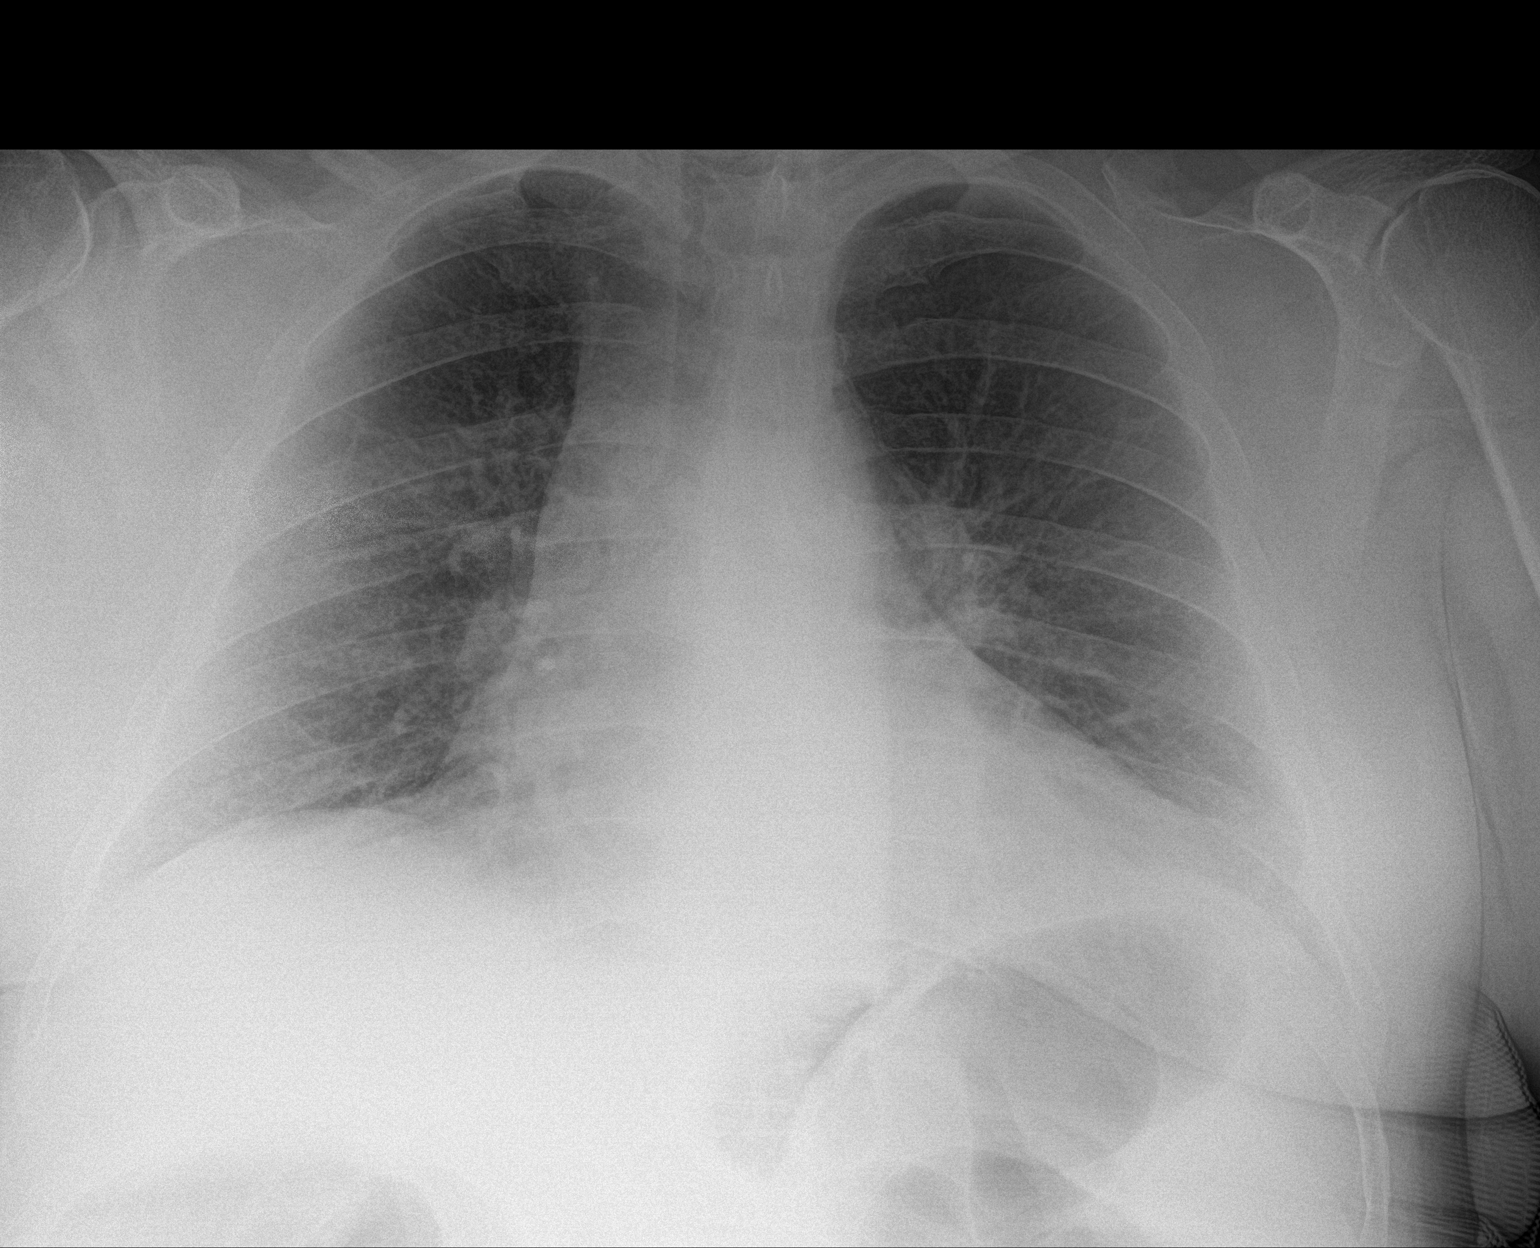

[2 of 2 positions shown; findings below may reference images not displayed]

FINDINGS: Enlargement of cardiac silhouette.

Mediastinal contours and pulmonary vascularity normal.

Lungs clear.

No pleural effusion or pneumothorax.

Bones unremarkable.
IMPRESSION: Enlargement of cardiac silhouette.

No acute abnormalities.

## 2014-08-10 MED ORDER — NICOTINE 14 MG/24HR TD PT24
14.0000 mg | MEDICATED_PATCH | Freq: Every day | TRANSDERMAL | Status: DC
  Administered 2014-08-10 – 2014-08-15 (×6): 14 mg via TRANSDERMAL
  Filled 2014-08-10 (×6): qty 1

## 2014-08-10 MED ORDER — ALBUTEROL SULFATE (2.5 MG/3ML) 0.083% IN NEBU
2.5000 mg | INHALATION_SOLUTION | RESPIRATORY_TRACT | Status: DC | PRN
  Administered 2014-08-10: 2.5 mg via RESPIRATORY_TRACT
  Filled 2014-08-10: qty 3

## 2014-08-10 MED ORDER — KETOROLAC TROMETHAMINE 15 MG/ML IJ SOLN
15.0000 mg | Freq: Three times a day (TID) | INTRAMUSCULAR | Status: DC
  Administered 2014-08-10 – 2014-08-12 (×7): 15 mg via INTRAVENOUS
  Filled 2014-08-10 (×7): qty 1

## 2014-08-10 MED ORDER — ACETAMINOPHEN 325 MG PO TABS
650.0000 mg | ORAL_TABLET | Freq: Four times a day (QID) | ORAL | Status: AC | PRN
  Administered 2014-08-10 – 2014-08-15 (×3): 650 mg via ORAL
  Filled 2014-08-10 (×3): qty 2

## 2014-08-10 MED ORDER — SODIUM CHLORIDE 0.9 % IV SOLN
INTRAVENOUS | Status: DC
  Administered 2014-08-10 – 2014-08-11 (×3): via INTRAVENOUS

## 2014-08-10 NOTE — Progress Notes (Signed)
Patient ID: [name] [name], male   DOB: September 15, 1955, 59 y.o.   MRN: [id number] [facility] Surgery Progress Note:   2 Days Post-Op  Subjective: Mental status is clear.  Sitting up on the side of the bed.  Temp noted and patient reports chills and sweats.  Complains of a lot of abdominal tenderness.  Incisions are swollen and slightly red.   Objective: Vital signs in last 24 hours: Temp:  [98.4 F (36.9 C)-102.7 F (39.3 C)] 102.7 F (39.3 C) (06/05 0656) Pulse Rate:  [86-109] 108 (06/05 0656) Resp:  [16-20] 20 (06/05 0656) BP: (102-135)/(56-77) 135/74 mmHg (06/05 0656) SpO2:  [94 %-97 %] 95 % (06/05 0656)  Intake/Output from previous day: 06/04 0701 - 06/05 0700 In: 2400 [P.O.:1200; I.V.:1200] Out: 300 [Urine:300] Intake/Output this shift:    Physical Exam: Work of breathing is not labored.  Has abdominal binder on.  Breath sounds are shallow and without audible wheezes.  Incisions are slightly red.    Lab Results:  Results for orders placed or performed during the hospital encounter of 08/08/14 (from the past 48 hour(s))  Surgical pcr screen     Status: None   Collection Time: 08/08/14  4:56 PM  Result Value Ref Range   MRSA, PCR NEGATIVE NEGATIVE   Staphylococcus aureus NEGATIVE NEGATIVE    Comment:        The Xpert SA Assay (FDA approved for NASAL specimens in patients over 28 years of age), is one component of a comprehensive surveillance program.  Test performance has been validated by [facility] for patients greater than or equal to 89 year old. It is not intended to diagnose infection nor to guide or monitor treatment.   Basic metabolic panel     Status: Abnormal   Collection Time: 08/09/14  4:44 AM  Result Value Ref Range   Sodium 132 (L) 135 - 145 mmol/L   Potassium 4.9 3.5 - 5.1 mmol/L   Chloride 99 (L) 101 - 111 mmol/L   CO2 25 22 - 32 mmol/L   Glucose, Bld 101 (H) 65 - 99 mg/dL   BUN 14 6 - 20 mg/dL   Creatinine, Ser 1.36 (H) 0.61 - 1.24 mg/dL   Calcium 8.5 (L) 8.9 - 10.3 mg/dL   GFR calc non Af Amer 56 (L) >60 mL/min   GFR calc Af Amer >60 >60 mL/min    Comment: (NOTE) The eGFR has been calculated using the CKD EPI equation. This calculation has not been validated in all clinical situations. eGFR's persistently <60 mL/min signify possible Chronic Kidney Disease.    Anion gap 8 5 - 15  CBC     Status: Abnormal   Collection Time: 08/09/14  4:44 AM  Result Value Ref Range   WBC 11.6 (H) 4.0 - 10.5 K/uL   RBC 3.75 (L) 4.22 - 5.81 MIL/uL   Hemoglobin 12.0 (L) 13.0 - 17.0 g/dL   HCT 35.4 (L) 39.0 - 52.0 %   MCV 94.4 78.0 - 100.0 fL   MCH 32.0 26.0 - 34.0 pg   MCHC 33.9 30.0 - 36.0 g/dL   RDW 12.4 11.5 - 15.5 %   Platelets 253 150 - 400 K/uL    Radiology/Results: No results found.  Anti-infectives: Anti-infectives    Start     Dose/Rate Route Frequency Ordered Stop   08/08/14 1700  ceFAZolin (ANCEF) 3 g in dextrose 5 % 50 mL IVPB     3 g 160 mL/hr over 30 Minutes Intravenous 3 times  per day 08/08/14 1632 08/09/14 0614   08/08/14 0900  ceFAZolin (ANCEF) 3 g in dextrose 5 % 50 mL IVPB  Status:  Discontinued     3 g 160 mL/hr over 30 Minutes Intravenous To Surgery 08/07/14 0915 08/08/14 1620      Assessment/Plan: Problem List: Patient Active Problem List   Diagnosis Date Noted  . Incarcerated incisional hernia 08/08/2014  . Pre-operative cardiovascular examination 06/29/2014  . Bilateral lower extremity edema 06/13/2013  . Chest pain with moderate risk for cardiac etiology 09/30/2012  . Tobacco abuse counseling 09/30/2012  . CAD S/P percutaneous coronary angioplasty   . Hypertension, essential, benign   . Obesity, Class II, BMI 35-39.9   . Dyslipidemia, goal LDL below 70   . Tobacco abuse - counseling provided   . OSTEOARTHRITIS 12/25/2006  . HIP PAIN, BILATERAL 11/15/2006    Will check CBC, CXR.  Nicoderm patch ordered.  Hopefully temp spike is pulmonary in origin.   2 Days Post-Op      Matt B. Hassell Done,  MD, [facility] [phone] beeper [phone]  08/10/2014 8:47 AM

## 2014-08-11 ENCOUNTER — Inpatient Hospital Stay (HOSPITAL_COMMUNITY): Payer: PPO

## 2014-08-11 ENCOUNTER — Encounter (HOSPITAL_COMMUNITY): Payer: Self-pay | Admitting: General Surgery

## 2014-08-11 DIAGNOSIS — F1120 Opioid dependence, uncomplicated: Secondary | ICD-10-CM | POA: Diagnosis present

## 2014-08-11 DIAGNOSIS — I10 Essential (primary) hypertension: Secondary | ICD-10-CM | POA: Diagnosis present

## 2014-08-11 DIAGNOSIS — F17210 Nicotine dependence, cigarettes, uncomplicated: Secondary | ICD-10-CM | POA: Diagnosis present

## 2014-08-11 DIAGNOSIS — K430 Incisional hernia with obstruction, without gangrene: Secondary | ICD-10-CM | POA: Diagnosis present

## 2014-08-11 DIAGNOSIS — F419 Anxiety disorder, unspecified: Secondary | ICD-10-CM | POA: Diagnosis present

## 2014-08-11 DIAGNOSIS — M797 Fibromyalgia: Secondary | ICD-10-CM | POA: Diagnosis present

## 2014-08-11 DIAGNOSIS — L03311 Cellulitis of abdominal wall: Secondary | ICD-10-CM | POA: Diagnosis not present

## 2014-08-11 DIAGNOSIS — T360X5A Adverse effect of penicillins, initial encounter: Secondary | ICD-10-CM | POA: Diagnosis not present

## 2014-08-11 DIAGNOSIS — E876 Hypokalemia: Secondary | ICD-10-CM | POA: Diagnosis not present

## 2014-08-11 DIAGNOSIS — K660 Peritoneal adhesions (postprocedural) (postinfection): Secondary | ICD-10-CM | POA: Diagnosis present

## 2014-08-11 DIAGNOSIS — Z6841 Body Mass Index (BMI) 40.0 and over, adult: Secondary | ICD-10-CM | POA: Diagnosis not present

## 2014-08-11 DIAGNOSIS — R197 Diarrhea, unspecified: Secondary | ICD-10-CM | POA: Diagnosis not present

## 2014-08-11 DIAGNOSIS — I2510 Atherosclerotic heart disease of native coronary artery without angina pectoris: Secondary | ICD-10-CM | POA: Diagnosis present

## 2014-08-11 DIAGNOSIS — Z79899 Other long term (current) drug therapy: Secondary | ICD-10-CM | POA: Diagnosis not present

## 2014-08-11 DIAGNOSIS — K219 Gastro-esophageal reflux disease without esophagitis: Secondary | ICD-10-CM | POA: Diagnosis present

## 2014-08-11 DIAGNOSIS — K6389 Other specified diseases of intestine: Secondary | ICD-10-CM | POA: Diagnosis not present

## 2014-08-11 DIAGNOSIS — Z955 Presence of coronary angioplasty implant and graft: Secondary | ICD-10-CM | POA: Diagnosis present

## 2014-08-11 DIAGNOSIS — Z7902 Long term (current) use of antithrombotics/antiplatelets: Secondary | ICD-10-CM | POA: Diagnosis not present

## 2014-08-11 DIAGNOSIS — E785 Hyperlipidemia, unspecified: Secondary | ICD-10-CM | POA: Diagnosis present

## 2014-08-11 DIAGNOSIS — J449 Chronic obstructive pulmonary disease, unspecified: Secondary | ICD-10-CM | POA: Diagnosis present

## 2014-08-11 DIAGNOSIS — Z7982 Long term (current) use of aspirin: Secondary | ICD-10-CM | POA: Diagnosis not present

## 2014-08-11 LAB — COMPREHENSIVE METABOLIC PANEL
ALT: 8 U/L — ABNORMAL LOW (ref 17–63)
AST: 22 U/L (ref 15–41)
Albumin: 2.8 g/dL — ABNORMAL LOW (ref 3.5–5.0)
Alkaline Phosphatase: 57 U/L (ref 38–126)
Anion gap: 7 (ref 5–15)
BUN: 10 mg/dL (ref 6–20)
CALCIUM: 8.2 mg/dL — AB (ref 8.9–10.3)
CO2: 25 mmol/L (ref 22–32)
Chloride: 102 mmol/L (ref 101–111)
Creatinine, Ser: 1.06 mg/dL (ref 0.61–1.24)
GFR calc Af Amer: >60 mL/min (ref >60)
Glucose, Bld: 112 mg/dL — ABNORMAL HIGH (ref 65–99)
Potassium: 4.1 mmol/L (ref 3.5–5.1)
SODIUM: 134 mmol/L — AB (ref 135–145)
TOTAL PROTEIN: 5.9 g/dL — AB (ref 6.5–8.1)
Total Bilirubin: 0.8 mg/dL (ref 0.3–1.2)

## 2014-08-11 LAB — CBC
HCT: 32 % — ABNORMAL LOW (ref 39.0–52.0)
HEMOGLOBIN: 10.6 g/dL — AB (ref 13.0–17.0)
MCH: 31.5 pg (ref 26.0–34.0)
MCHC: 33.1 g/dL (ref 30.0–36.0)
MCV: 95.2 fL (ref 78.0–100.0)
PLATELETS: 252 10*3/uL (ref 150–400)
RBC: 3.36 MIL/uL — ABNORMAL LOW (ref 4.22–5.81)
RDW: 12.6 % (ref 11.5–15.5)
WBC: 12.2 10*3/uL — ABNORMAL HIGH (ref 4.0–10.5)

## 2014-08-11 IMAGING — CT CT ABD-PELV W/O CM
2 of 4 series · 16 of 46 positions shown, 18 images · non-contrast
Comparison: [DATE]

ADDENDUM:
These results were called by telephone to Dr. POTLURI.
CLINICAL DATA: Surgery 3 days ago for incarcerated incisional
hernia. Lysis of adhesions and ventral hernia repair was performed
at that time with mesh. Fever up to 102 degrees F yesterday with
mild cellulitis of the lower abdominal wall. Abdominal pain
postoperatively.

EXAM:
CT ABDOMEN AND PELVIS WITHOUT CONTRAST
TECHNIQUE: Multidetector CT imaging of the abdomen and pelvis was performed
following the standard protocol without IV contrast.

[Series 2: abd/ pelvis 5.0 i30f 1 · axial · 0.94mm/px · z∈[+972,+1457]mm · 13 of 107 slices shown, 15 images]
[im 5/107  soft-tissue]
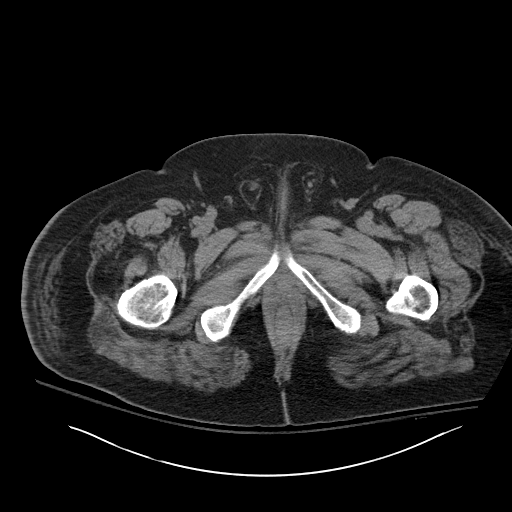
[im 5/107  bone]
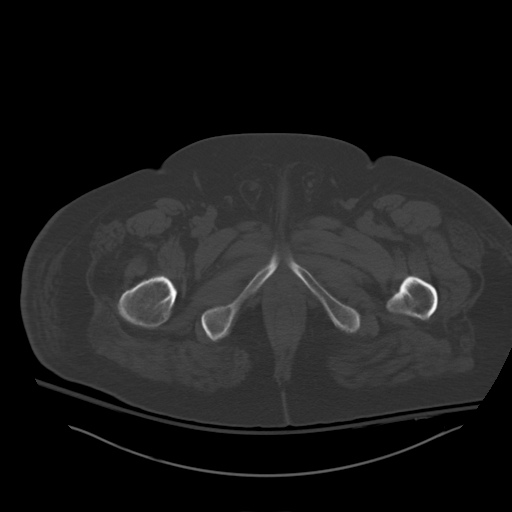
[im 14/107  soft-tissue]
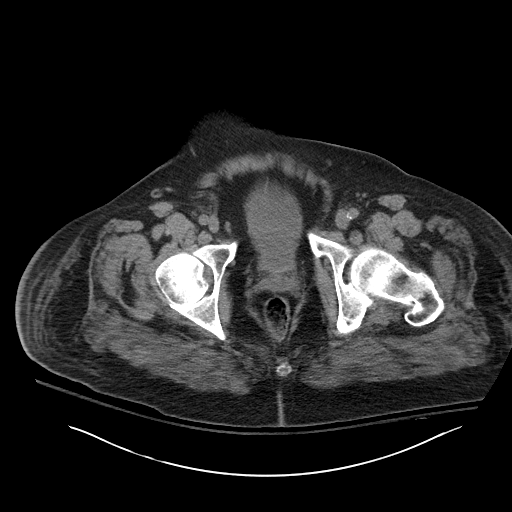
[im 23/107  soft-tissue]
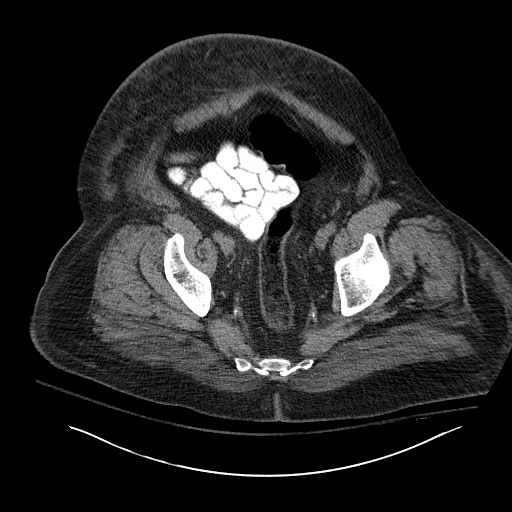
[im 31/107  soft-tissue]
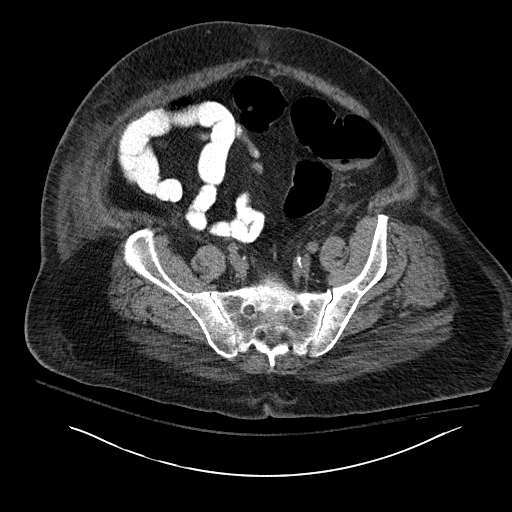
[im 36/107  soft-tissue]
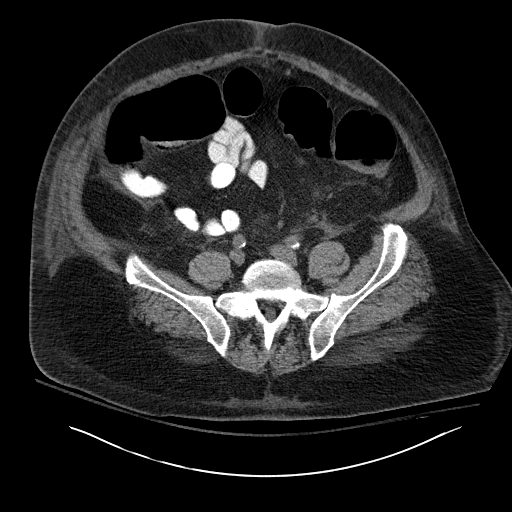
[im 45/107  soft-tissue]
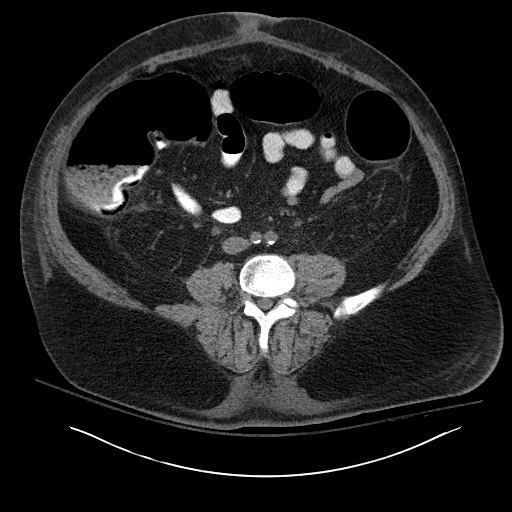
[im 54/107  soft-tissue]
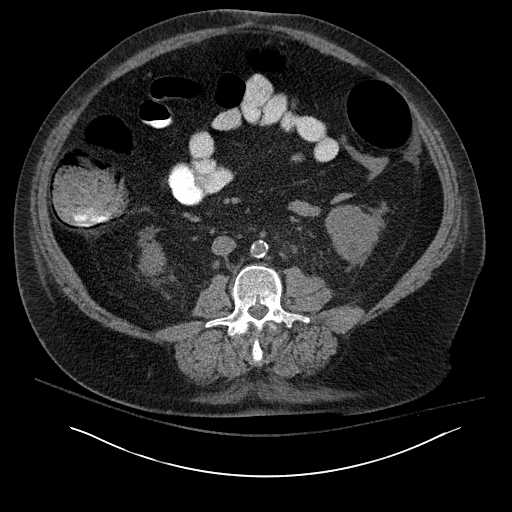
[im 62/107  soft-tissue]
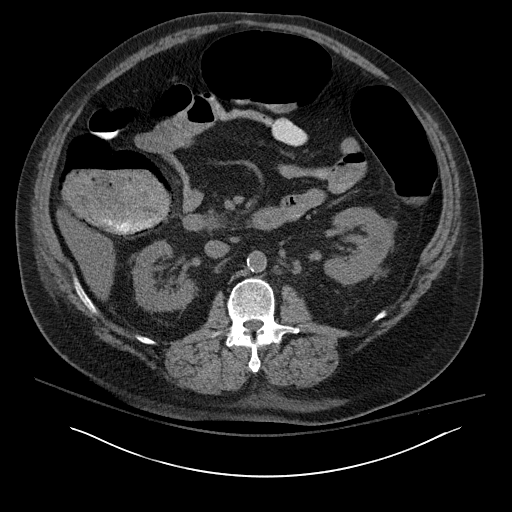
[im 71/107  soft-tissue]
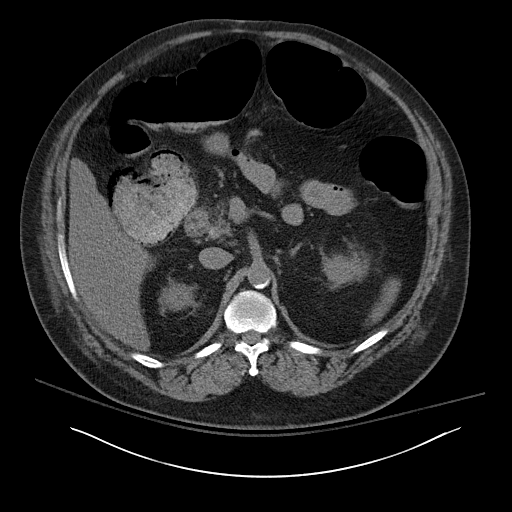
[im 71/107  bone]
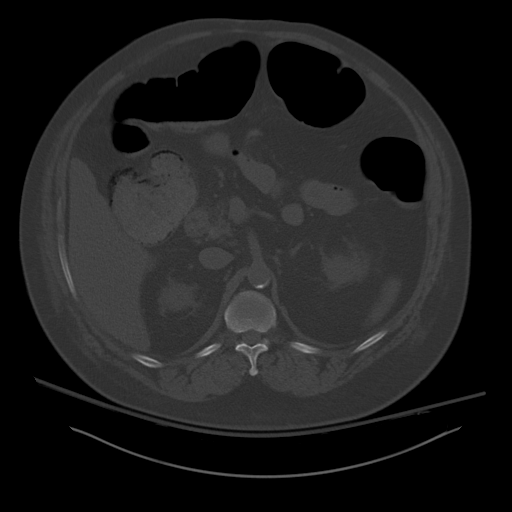
[im 76/107  soft-tissue]
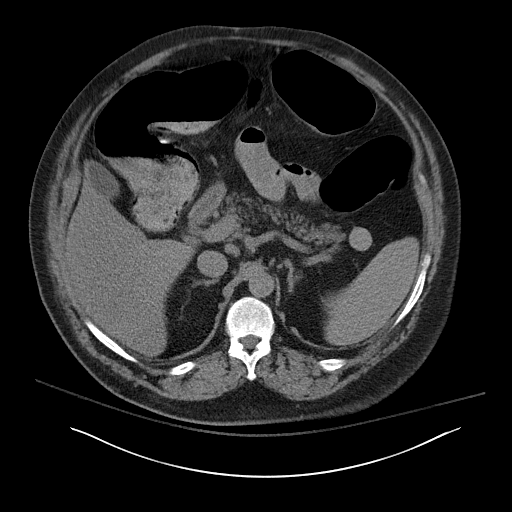
[im 84/107  soft-tissue]
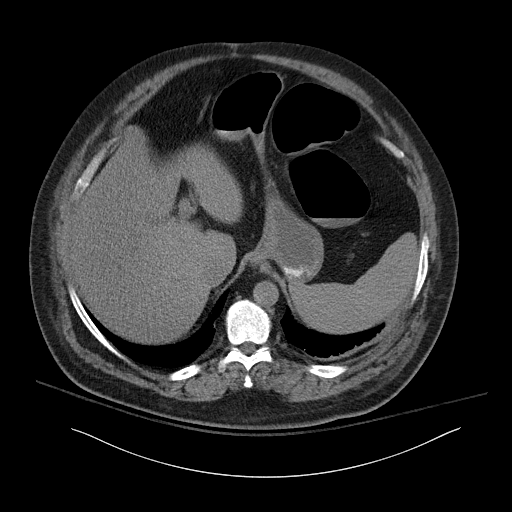
[im 93/107  soft-tissue]
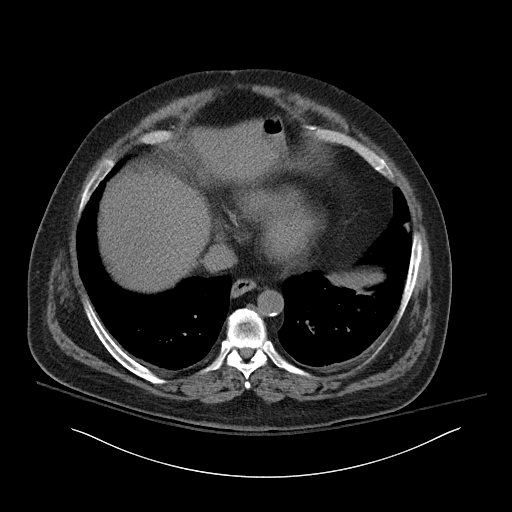
[im 102/107  soft-tissue]
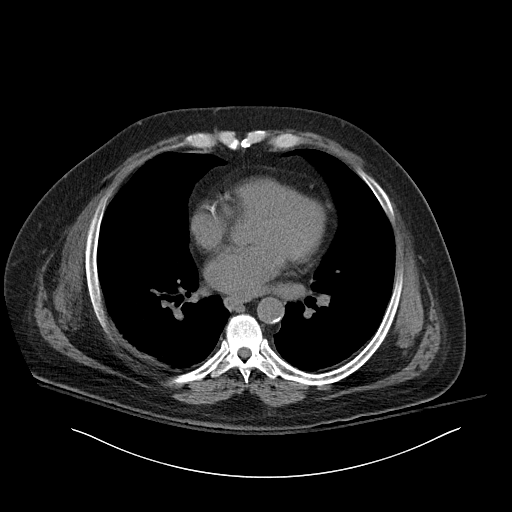

[Series 5: cor st · coronal · 0.98mm/px · 3 of 133 slices shown]
[im 45/133  soft-tissue]
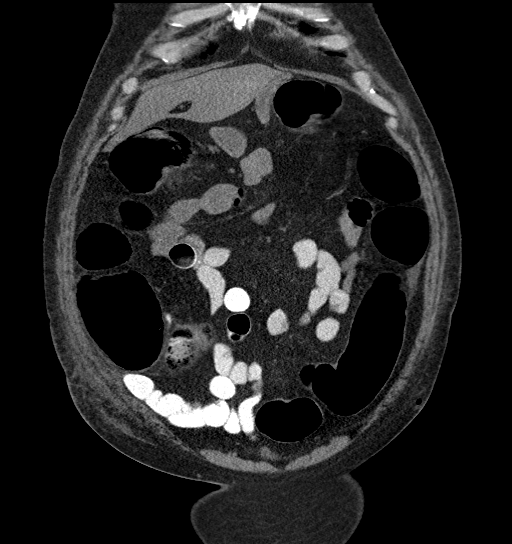
[im 59/133  soft-tissue]
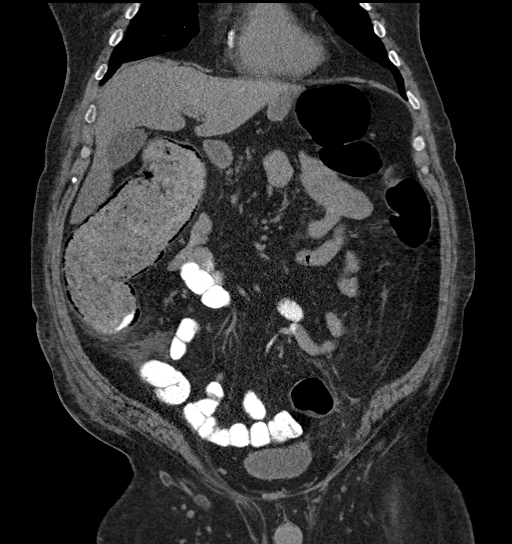
[im 74/133  soft-tissue]
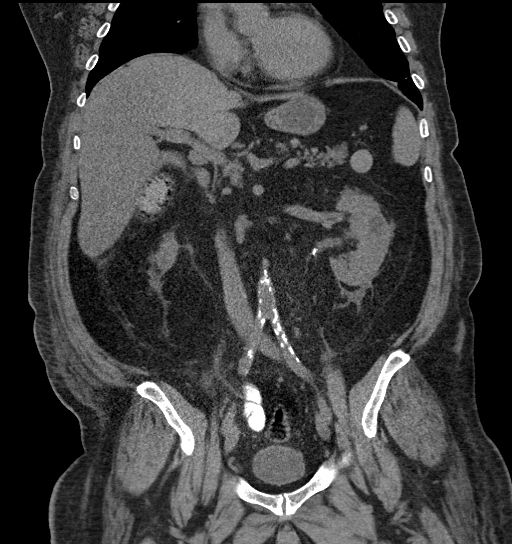

[16 of 46 positions shown; findings below may reference images not displayed]

FINDINGS: Significant postoperative ileus of the colon identified with the
ascending colon measuring up to 9.5 cm in greatest diameter. There
is evidence of definite pneumatosis of the ascending colon beginning
just above the level of the cecum and continuing to the splenic
flexure. No associated free intraperitoneal air is identified. There
is no evidence of small bowel obstruction or dilatation.

No evidence of recurrent incisional ventral hernia. Mild
subcutaneous stranding and skin thickening is noted in the lower
abdominal wall consistent with cellulitis. No focal abscess is
identified.

Unenhanced appearance of the liver, gallbladder, pancreas, spleen,
adrenal glands and kidneys are unremarkable. Stable cyst in the
lateral left kidney. The bladder is unremarkable. No enlarged lymph
nodes are seen. No bony abnormalities.
IMPRESSION: 1. Postoperative colonic ileus with the ascending colon measuring up
to 9.5 cm in diameter. There is associated pneumatosis involving the
ascending colon over a fairly long segment beginning just above the
cecum and continuing to the splenic flexure. There is no associated
free intraperitoneal air or small bowel obstruction.
2. No evidence of recurrent ventral hernia.
3. Cellulitis of lower abdominal wall without evidence of focal
abscess.

## 2014-08-11 MED ORDER — BARIUM SULFATE 2.1 % PO SUSP
450.0000 mL | Freq: Two times a day (BID) | ORAL | Status: DC | PRN
  Administered 2014-08-11 (×2): 450 mL via ORAL
  Filled 2014-08-11 (×2): qty 450

## 2014-08-11 MED ORDER — PIPERACILLIN-TAZOBACTAM 3.375 G IVPB 30 MIN
3.3750 g | Freq: Once | INTRAVENOUS | Status: AC
  Administered 2014-08-11: 3.375 g via INTRAVENOUS
  Filled 2014-08-11: qty 50

## 2014-08-11 MED ORDER — VANCOMYCIN HCL 10 G IV SOLR
2000.0000 mg | Freq: Once | INTRAVENOUS | Status: AC
  Administered 2014-08-11: 2000 mg via INTRAVENOUS
  Filled 2014-08-11: qty 2000

## 2014-08-11 MED ORDER — VANCOMYCIN HCL IN DEXTROSE 1-5 GM/200ML-% IV SOLN
1000.0000 mg | Freq: Two times a day (BID) | INTRAVENOUS | Status: DC
  Administered 2014-08-11 – 2014-08-13 (×5): 1000 mg via INTRAVENOUS
  Filled 2014-08-11 (×6): qty 200

## 2014-08-11 MED ORDER — ENOXAPARIN SODIUM 60 MG/0.6ML ~~LOC~~ SOLN
60.0000 mg | SUBCUTANEOUS | Status: DC
  Administered 2014-08-12 – 2014-08-15 (×4): 60 mg via SUBCUTANEOUS
  Filled 2014-08-11 (×5): qty 0.6

## 2014-08-11 MED ORDER — DEXTROSE-NACL 5-0.9 % IV SOLN
INTRAVENOUS | Status: DC
  Administered 2014-08-11: 22:00:00 via INTRAVENOUS

## 2014-08-11 MED ORDER — PIPERACILLIN-TAZOBACTAM 3.375 G IVPB
3.3750 g | Freq: Three times a day (TID) | INTRAVENOUS | Status: DC
  Administered 2014-08-11 – 2014-08-13 (×7): 3.375 g via INTRAVENOUS
  Filled 2014-08-11 (×9): qty 50

## 2014-08-11 MED ORDER — METOPROLOL TARTRATE 1 MG/ML IV SOLN
5.0000 mg | Freq: Four times a day (QID) | INTRAVENOUS | Status: DC
  Administered 2014-08-11 – 2014-08-13 (×11): 5 mg via INTRAVENOUS
  Filled 2014-08-11 (×12): qty 5

## 2014-08-11 MED ORDER — VANCOMYCIN HCL IN DEXTROSE 1-5 GM/200ML-% IV SOLN
1000.0000 mg | Freq: Once | INTRAVENOUS | Status: DC
  Filled 2014-08-11: qty 200
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
## 2014-08-11 NOTE — Progress Notes (Signed)
ANTIBIOTIC CONSULT NOTE - INITIAL  Pharmacy Consult for Vancocin and Zosyn Indication: cellulitis  Allergies  Allergen Reactions  . Iohexol      Code: HIVES, Desc: PT STATES HE HAD AN IVP 15 YRS AGO AND HAD SOB AND BROKE OUT IN HIVES., Onset Date: 16109604     Patient Measurements: Height: '5\' 9"'$  (175.3 cm) Weight: 266 lb 12.1 oz (121 kg) IBW/kg (Calculated) : 70.7  Vital Signs: Temp: 99.3 F (37.4 C) (06/06 0238) Temp Source: Axillary (06/06 0238) BP: 109/65 mmHg (06/06 0238) Pulse Rate: 108 (06/06 0238)  Labs:  Recent Labs  08/08/14 0758 08/09/14 0444 08/10/14 1104 08/11/14 0357  WBC 12.7* 11.6* 17.2* 12.2*  HGB 12.8* 12.0* 12.4* 10.6*  PLT 280 253 280 252  CREATININE 1.62* 1.36* 1.22  --    Estimated Creatinine Clearance: 84.8 mL/min (by C-G formula based on Cr of 1.22).   Microbiology: Recent Results (from the past 720 hour(s))  Surgical pcr screen     Status: None   Collection Time: 08/08/14  4:56 PM  Result Value Ref Range Status   MRSA, PCR NEGATIVE NEGATIVE Final   Staphylococcus aureus NEGATIVE NEGATIVE Final    Comment:        The Xpert SA Assay (FDA approved for NASAL specimens in patients over 49 years of age), is one component of a comprehensive surveillance program.  Test performance has been validated by [facility] for patients greater than or equal to 67 year old. It is not intended to diagnose infection nor to guide or monitor treatment.     Medical History: Past Medical History  Diagnosis Date  . CAD S/P percutaneous coronary angioplasty 2006    January 06: Taxus DES to RCA; March '06 Cypher DES to LAD; EF 66%  . Hypertension, essential, benign   . Obesity, Class II, BMI 35-39.9   . Dyslipidemia, goal LDL below 70   . Tobacco abuse   . Anxiety disorder   . Depression   . History of migraine headaches   . Chronic pain syndrome     , as noted by handwritten note from primary physician   . FRACTURE, RIB, LEFT 11/15/2006   Qualifier: Diagnosis of  By: Drinkard MSN, FNP-C, Collie Siad    . Pneumonia 2015    hosp. after RIB fx   . COPD (chronic obstructive pulmonary disease)   . Anxiety   . GERD (gastroesophageal reflux disease)     on occas. uses TUMS for heartburn   . Headache     pt. remarks that he gets sinus headaches   . Neuromuscular disorder     L leg, nerve damage   . Arthritis     "everywhere"     Medications:  Prescriptions prior to admission  Medication Sig Dispense Refill Last Dose  . ALPRAZolam (XANAX) 0.5 MG tablet Take 0.5 mg by mouth 3 (three) times daily.   08/07/2014 at Unknown time  . aspirin EC 81 MG tablet Take 1 tablet (81 mg total) by mouth daily. 90 tablet 3 Past Week at Unknown time  . calcium carbonate (TUMS EX) 750 MG chewable tablet Chew 2 tablets by mouth daily as needed for heartburn.   Past Week at Unknown time  . Cholecalciferol (VITAMIN D-3) 1000 UNITS CAPS Take 1,000 Units by mouth daily.    08/07/2014 at Unknown time  . clopidogrel (PLAVIX) 75 MG tablet Take 75 mg by mouth daily.   Past Week at Unknown time  . Fluticasone-Salmeterol (ADVAIR) 100-50 MCG/DOSE AEPB  Inhale 1 puff into the lungs 2 (two) times daily as needed (pt. reports that he only uses PRN).    08/07/2014 at Unknown time  . furosemide (LASIX) 40 MG tablet Take 40 mg by mouth daily.   08/07/2014 at Unknown time  . hydrOXYzine (VISTARIL) 50 MG capsule Take 50 mg by mouth 4 (four) times daily as needed for itching.   08/07/2014 at Unknown time  . isosorbide mononitrate (IMDUR) 60 MG 24 hr tablet Take 60 mg by mouth daily.   08/07/2014 at Unknown time  . lisinopril (PRINIVIL,ZESTRIL) 10 MG tablet Take 10 mg by mouth daily.   08/07/2014 at Unknown time  . morphine (MSIR) 30 MG tablet Take 30 mg by mouth every 4 (four) hours as needed for pain.   08/08/2014 at 0000  . NASONEX 50 MCG/ACT nasal spray Place 2 sprays into the nose as needed (allergies).   0 Past Week at Unknown time  . potassium chloride SA (K-DUR,KLOR-CON) 20 MEQ tablet  Take 20 mEq by mouth daily.   08/07/2014 at Unknown time  . rosuvastatin (CRESTOR) 20 MG tablet Take 20 mg by mouth daily.   08/07/2014 at Unknown time  . betamethasone valerate (VALISONE) 0.1 % cream Apply 1 application topically as needed.    More than a month at Unknown time   Scheduled:  . ALPRAZolam  0.5 mg Oral TID  . aspirin EC  81 mg Oral Daily  . clopidogrel  75 mg Oral Daily  . [START ON 08/12/2014] enoxaparin (LOVENOX) injection  60 mg Subcutaneous Q24H  . fluticasone  2 spray Each Nare Daily  . furosemide  40 mg Oral Daily  . isosorbide mononitrate  60 mg Oral Daily  . ketorolac  15 mg Intravenous 3 times per day  . lisinopril  10 mg Oral Daily  . mometasone-formoterol  2 puff Inhalation BID  . nicotine  14 mg Transdermal Daily  . piperacillin-tazobactam  3.375 g Intravenous Once  . potassium chloride SA  20 mEq Oral Daily  . rosuvastatin  20 mg Oral q1800  . vancomycin  1,000 mg Intravenous Once   Infusions:  . sodium chloride 125 mL/hr at 08/11/14 0524    Assessment: 59yo male admitted for repair of incision hernias, now w/ swelling and redness to incision sites, to begin IV ABX for cellulitis.  Goal of Therapy:  Vancomycin trough level 10-15 mcg/ml  Plan:  Will give vancomycin '2000mg'$  IV x1 followed by '1000mg'$  IV Q12H as well as Zosyn 3.375g IV Q8H and monitor CBC, Cx, levels prn.  Wynona Neat, PharmD, BCPS  08/11/2014,6:06 AM

## 2014-08-11 NOTE — Care Management Note (Signed)
Case Management Note  Patient Details  Name: [name] [name] MRN: [id number] Date of Birth: 1955/04/24  Subjective/Objective:                   UR completed  Action/Plan:   Expected Discharge Date:     08-14-14             Expected Discharge Plan:  Home/Self Care  In-House Referral:     Discharge planning Services     Post Acute Care Choice:    Choice offered to:     DME Arranged:    DME Agency:     HH Arranged:    [place] Agency:     Status of Service:  In process, will continue to follow  Medicare Important Message Given:    Date Medicare IM Given:    Medicare IM give by:    Date Additional Medicare IM Given:    Additional Medicare Important Message give by:     If discussed at [place] of Stay Meetings, dates discussed:    Additional Comments:  Marilu Favre, RN 08/11/2014, 12:53 PM

## 2014-08-12 ENCOUNTER — Inpatient Hospital Stay (HOSPITAL_COMMUNITY): Payer: PPO

## 2014-08-12 LAB — CBC
HEMATOCRIT: 32 % — AB (ref 39.0–52.0)
Hemoglobin: 10.8 g/dL — ABNORMAL LOW (ref 13.0–17.0)
MCH: 32.5 pg (ref 26.0–34.0)
MCHC: 33.8 g/dL (ref 30.0–36.0)
MCV: 96.4 fL (ref 78.0–100.0)
PLATELETS: 260 10*3/uL (ref 150–400)
RBC: 3.32 MIL/uL — ABNORMAL LOW (ref 4.22–5.81)
RDW: 12.7 % (ref 11.5–15.5)
WBC: 14.6 10*3/uL — AB (ref 4.0–10.5)

## 2014-08-12 LAB — CLOSTRIDIUM DIFFICILE BY PCR: Toxigenic C. Difficile by PCR: NEGATIVE

## 2014-08-12 IMAGING — DX DG ABDOMEN 2V
4 series · 4 of 4 positions shown · non-contrast
Comparison: CT abdomen and pelvis yesterday, [DATE],
[DATE].

CLINICAL DATA: Postop laparoscopic lysis of adhesions and abdominal
wall incisional hernia repair [DATE], now with abdominal pain
and distention. Followup ileus involving the colon and small bowel.

EXAM:
ABDOMEN - 2 VIEW

[abdomen erect]
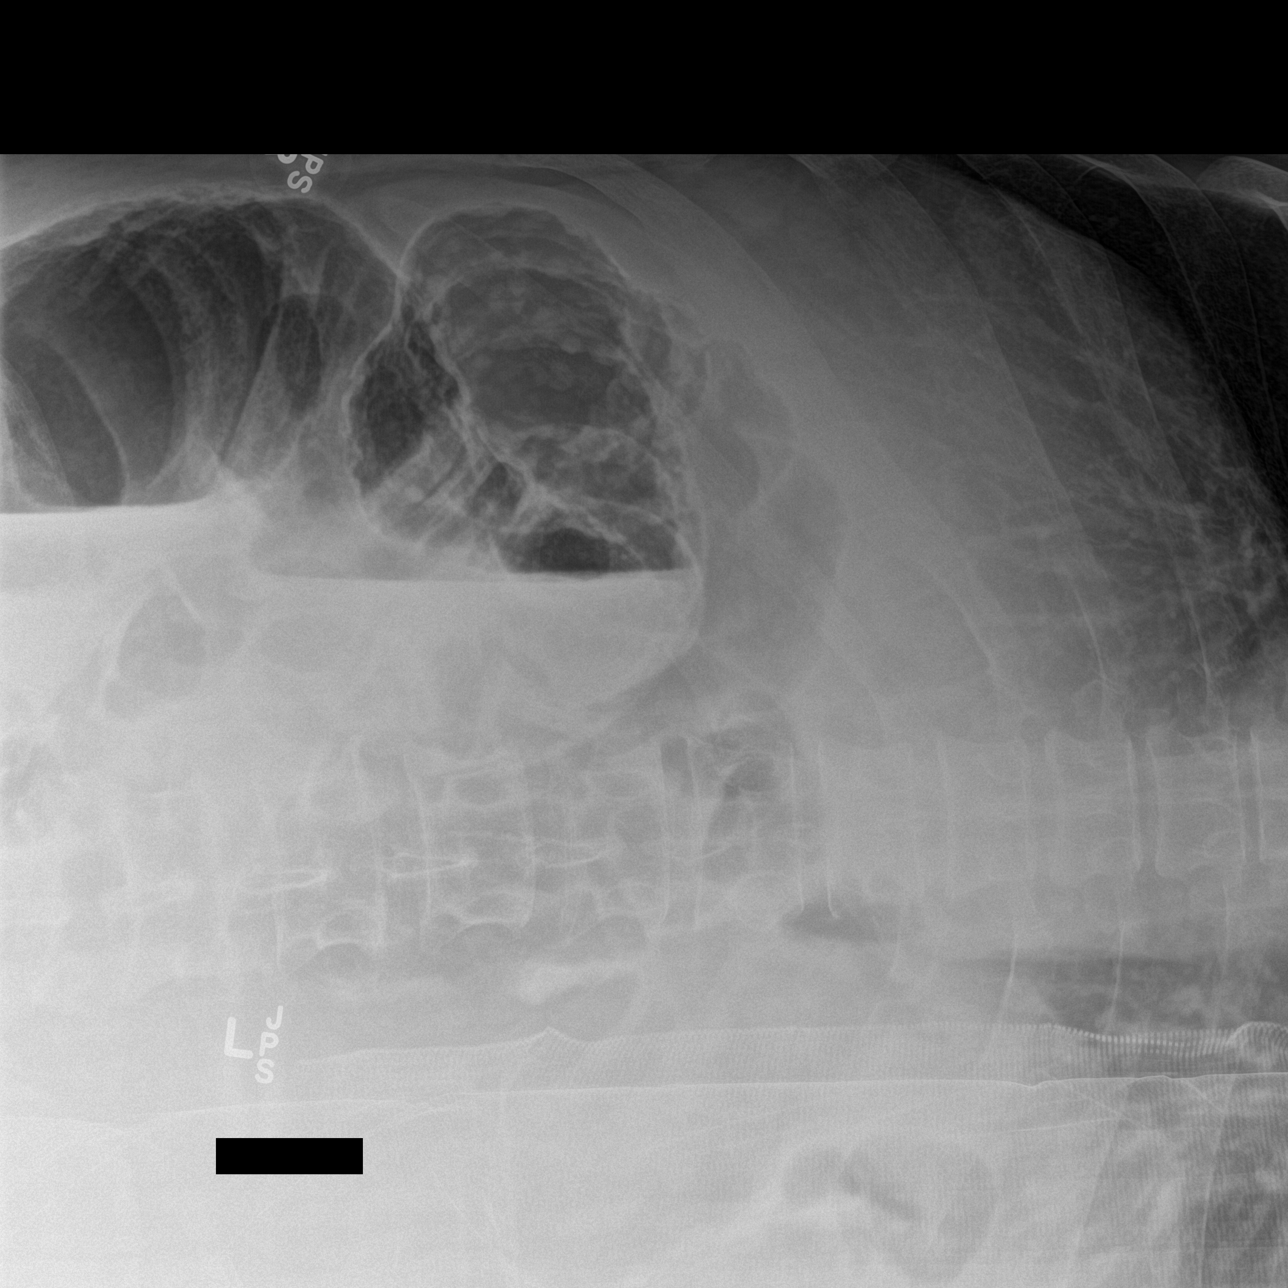

[abdomen supine (1 of 3)]
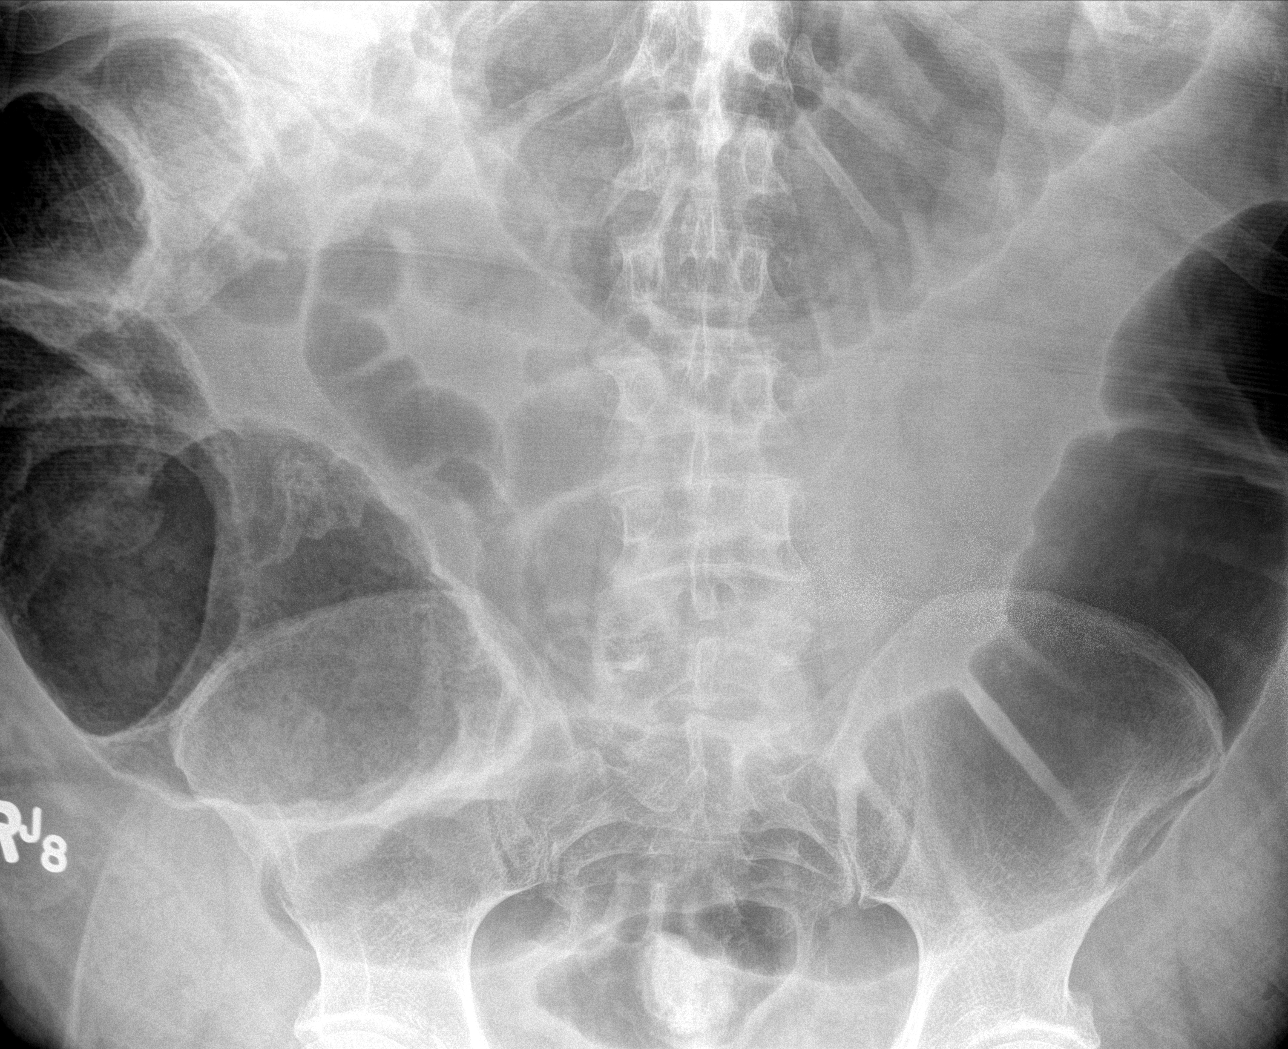

[abdomen supine (2 of 3)]
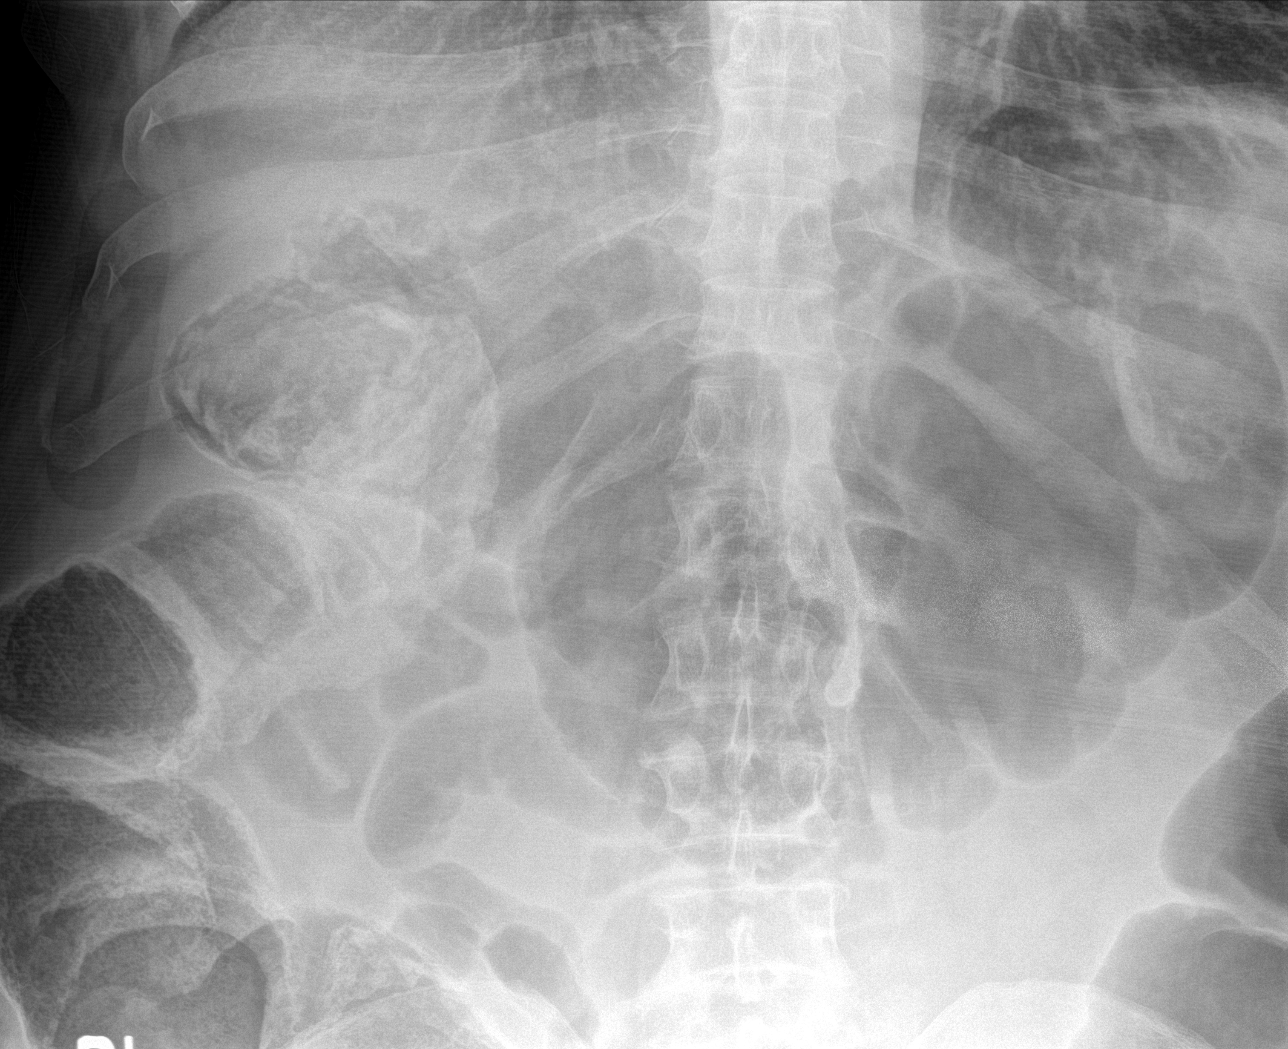

[abdomen supine (3 of 3)]
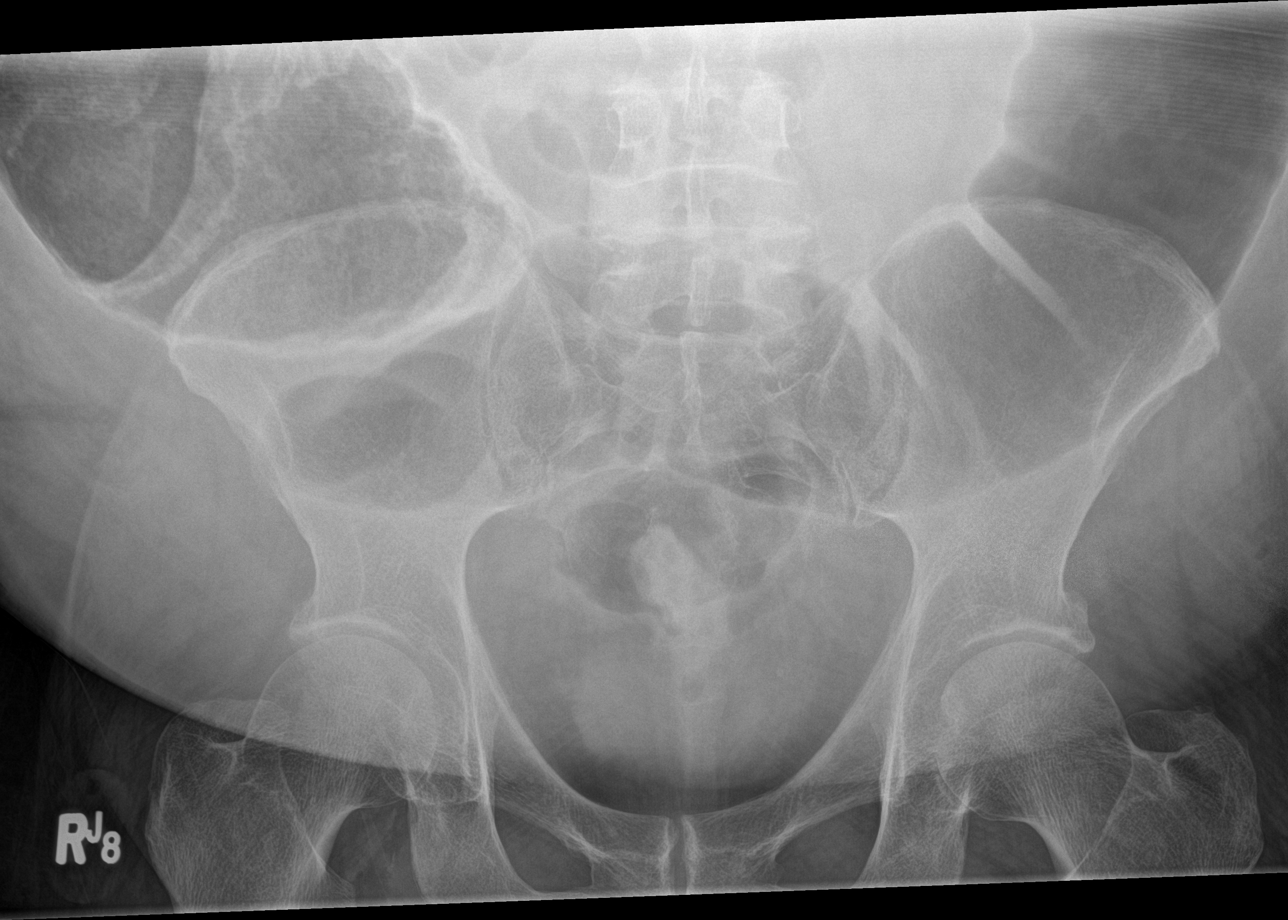

[4 of 4 positions shown; findings below may reference images not displayed]

FINDINGS: Moderate gaseous distention of the entire colon to the level of the
distal sigmoid colon, unchanged. Expected colonic stool burden. Oral
contrast material from the CT yesterday is present within the colon.
Ascending colonic air-fluid level consistent liquid stool. No free
intraperitoneal air on the lateral decubitus image. Gas within
nondistended distal small bowel, unchanged.
IMPRESSION: 1. Stable moderate colonic ileus and mild distal small bowel ileus
since the CT yesterday. No evidence of free intraperitoneal air.
2. Oral contrast from yesterday's CT has reached the colon,
indicating no small bowel obstruction.

## 2014-08-12 MED ORDER — SODIUM CHLORIDE 0.9 % IV SOLN
INTRAVENOUS | Status: DC
  Administered 2014-08-12 – 2014-08-13 (×2): via INTRAVENOUS
  Filled 2014-08-12 (×4): qty 1000

## 2014-08-12 NOTE — Evaluation (Signed)
Physical Therapy Evaluation Patient Details Name: [name] [name] MRN: [id number] DOB: 05/09/1955 Today's Date: 08/12/2014   History of Present Illness  pt presents with Hernia Repair, Abdominal Cellulitis, and Diarrhea.    Clinical Impression  Pt mobility limited by urgency to have loose BMs.  Pt declined ambulation with PT due to concern about needing to have another BM and pt indicates he has very minimal warning until it is urgent he get to commode.  Pt and nsg indicate that pt has been ambulating with Nsg staff S.  Encouraged pt to continue to ambulate as he is able and PT will check back on him another time to ensure he is returning to baseline.      Follow Up Recommendations No PT follow up;Supervision - Intermittent    Equipment Recommendations  None recommended by PT    Recommendations for Other Services       Precautions / Restrictions Precautions Precautions: Fall Precaution Comments: Order for abdominal binder, but pt not wearing one and the ones in his room are too small.   Restrictions Weight Bearing Restrictions: No      Mobility  Bed Mobility Overal bed mobility: Needs Assistance Bed Mobility: Supine to Sit     Supine to sit: Supervision     General bed mobility comments: pt a little impulsive with urgency to use commode.    Transfers Overall transfer level: Needs assistance Equipment used: None Transfers: Sit to/from [facility] Sit to Stand: Min guard Stand pivot transfers: Min guard       General transfer comment: pt able to complete transfer without physical A, but needs S and A for management of lines as pt a little impulsive.    Ambulation/Gait             General Gait Details: pt declined ambulation at this time due to uncertain if he needed to have another BM.    Stairs            Wheelchair Mobility    Modified Rankin (Stroke Patients Only)       Balance Overall balance assessment: No apparent balance  deficits (not formally assessed)                                           Pertinent Vitals/Pain Pain Assessment: Faces Faces Pain Scale: Hurts even more Pain Location: pt grimaces with transfers and indicates stomach hurts, but does not rate.   Pain Descriptors / Indicators: Grimacing Pain Intervention(s): Monitored during session;Repositioned    Home Living Family/patient expects to be discharged to:: Private residence Living Arrangements: Parent   Type of Home: House Home Access: Stairs to enter Entrance Stairs-Rails: Right Entrance Stairs-Number of Steps: 4 Home Layout: One level Home Equipment: None Additional Comments: pt takes care of his mother.  pt has A with taking care of his mother while he is in hospital and says he can ask that lady to keep helping his mother when he returns to home.      Prior Function Level of Independence: Independent               Hand Dominance        Extremity/Trunk Assessment   Upper Extremity Assessment: Overall WFL for tasks assessed           Lower Extremity Assessment: Overall WFL for tasks assessed  Cervical / Trunk Assessment: Normal  Communication   Communication: No difficulties  Cognition Arousal/Alertness: Awake/alert Behavior During Therapy: WFL for tasks assessed/performed Overall Cognitive Status: Within Functional Limits for tasks assessed                      General Comments      Exercises        Assessment/Plan    PT Assessment Patient needs continued PT services  PT Diagnosis Difficulty walking   PT Problem List Decreased strength;Decreased activity tolerance;Decreased mobility;Obesity;Pain  PT Treatment Interventions DME instruction;Gait training;Stair training;Functional mobility training;Therapeutic activities;Therapeutic exercise;Balance training;Patient/family education   PT Goals (Current goals can be found in the Care Plan section) Acute Rehab PT  Goals Patient Stated Goal: Back to normal PT Goal Formulation: With patient Time For Goal Achievement: 08/26/14 Potential to Achieve Goals: Good    Frequency Min 3X/week   Barriers to discharge Decreased caregiver support      Co-evaluation               End of Session   Activity Tolerance: Patient limited by fatigue;Other (comment) (pt limited by multiple loose BMs.  ) Patient left: in bed;with call bell/phone within reach;with nursing/sitter in room (pt sitting EOB.) Nurse Communication: Mobility status         Time: 1281-1886 PT Time Calculation (min) (ACUTE ONLY): 27 min   Charges:   PT Evaluation $Initial PT Evaluation Tier I: 1 Procedure PT Treatments $Therapeutic Activity: 8-22 mins   PT G CodesCatarina Hartshorn, Jennings 08/12/2014, 11:26 AM
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
## 2014-08-13 LAB — BASIC METABOLIC PANEL
Anion gap: 8 (ref 5–15)
BUN: 6 mg/dL (ref 6–20)
CHLORIDE: 102 mmol/L (ref 101–111)
CO2: 26 mmol/L (ref 22–32)
Calcium: 8 mg/dL — ABNORMAL LOW (ref 8.9–10.3)
Creatinine, Ser: 1.11 mg/dL (ref 0.61–1.24)
GFR calc Af Amer: >60 mL/min (ref >60)
GFR calc non Af Amer: >60 mL/min (ref >60)
Glucose, Bld: 101 mg/dL — ABNORMAL HIGH (ref 65–99)
Potassium: 3.5 mmol/L (ref 3.5–5.1)
Sodium: 136 mmol/L (ref 135–145)

## 2014-08-13 LAB — CBC
HEMATOCRIT: 31.3 % — AB (ref 39.0–52.0)
Hemoglobin: 10.3 g/dL — ABNORMAL LOW (ref 13.0–17.0)
MCH: 31.4 pg (ref 26.0–34.0)
MCHC: 32.9 g/dL (ref 30.0–36.0)
MCV: 95.4 fL (ref 78.0–100.0)
PLATELETS: 271 10*3/uL (ref 150–400)
RBC: 3.28 MIL/uL — ABNORMAL LOW (ref 4.22–5.81)
RDW: 12.7 % (ref 11.5–15.5)
WBC: 7.3 10*3/uL (ref 4.0–10.5)

## 2014-08-13 MED ORDER — SODIUM CHLORIDE 0.9 % IV SOLN
INTRAVENOUS | Status: DC
  Administered 2014-08-13 – 2014-08-14 (×4): via INTRAVENOUS
  Filled 2014-08-13 (×5): qty 1000

## 2014-08-13 MED ORDER — HYDROMORPHONE HCL 1 MG/ML IJ SOLN
0.5000 mg | INTRAMUSCULAR | Status: DC | PRN
  Administered 2014-08-13 – 2014-08-15 (×7): 0.5 mg via INTRAVENOUS
  Filled 2014-08-13 (×8): qty 1

## 2014-08-13 MED ORDER — POTASSIUM CHLORIDE 20 MEQ/15ML (10%) PO SOLN
40.0000 meq | Freq: Two times a day (BID) | ORAL | Status: DC
  Administered 2014-08-13: 40 meq via ORAL
  Filled 2014-08-13: qty 30

## 2014-08-13 MED ORDER — CLOPIDOGREL BISULFATE 75 MG PO TABS
75.0000 mg | ORAL_TABLET | Freq: Every day | ORAL | Status: DC
  Administered 2014-08-13 – 2014-08-14 (×2): 75 mg via ORAL
  Filled 2014-08-13 (×2): qty 1

## 2014-08-13 MED ORDER — SACCHAROMYCES BOULARDII 250 MG PO CAPS
250.0000 mg | ORAL_CAPSULE | Freq: Two times a day (BID) | ORAL | Status: DC
  Administered 2014-08-13 – 2014-08-14 (×4): 250 mg via ORAL
  Filled 2014-08-13 (×6): qty 1

## 2014-08-13 MED ORDER — POTASSIUM CHLORIDE CRYS ER 20 MEQ PO TBCR
40.0000 meq | EXTENDED_RELEASE_TABLET | Freq: Two times a day (BID) | ORAL | Status: DC
  Administered 2014-08-13 – 2014-08-14 (×3): 40 meq via ORAL
  Filled 2014-08-13 (×3): qty 2

## 2014-08-13 MED ORDER — HYDROCODONE-ACETAMINOPHEN 5-325 MG PO TABS
1.0000 | ORAL_TABLET | ORAL | Status: DC | PRN
Start: 2014-08-13 — End: 2014-08-14
  Administered 2014-08-13 – 2014-08-14 (×5): 2 via ORAL
  Filled 2014-08-13 (×5): qty 2
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
## 2014-08-13 NOTE — Progress Notes (Signed)
Addendum to previous note: (Please see). Because of his prior history of colon polyps, this patient should have a surveillance colonoscopy at some point in the near future, presumably once he is over the current illness.  Cleotis Nipper, M.D. Pager [phone] If no answer or after 5 PM call [phone]
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
## 2014-08-14 ENCOUNTER — Inpatient Hospital Stay (HOSPITAL_COMMUNITY): Payer: PPO

## 2014-08-14 LAB — BASIC METABOLIC PANEL
Anion gap: 9 (ref 5–15)
BUN: <5 mg/dL — ABNORMAL LOW (ref 6–20)
CALCIUM: 8.2 mg/dL — AB (ref 8.9–10.3)
CHLORIDE: 106 mmol/L (ref 101–111)
CO2: 24 mmol/L (ref 22–32)
Creatinine, Ser: 1.09 mg/dL (ref 0.61–1.24)
GFR calc Af Amer: >60 mL/min (ref >60)
GFR calc non Af Amer: >60 mL/min (ref >60)
Glucose, Bld: 98 mg/dL (ref 65–99)
Potassium: 4.5 mmol/L (ref 3.5–5.1)
Sodium: 139 mmol/L (ref 135–145)

## 2014-08-14 LAB — CBC WITH DIFFERENTIAL/PLATELET
BASOS PCT: 0 % (ref 0–1)
Basophils Absolute: 0 10*3/uL (ref 0.0–0.1)
EOS ABS: 0.4 10*3/uL (ref 0.0–0.7)
Eosinophils Relative: 5 % (ref 0–5)
HEMATOCRIT: 33.2 % — AB (ref 39.0–52.0)
Hemoglobin: 11.1 g/dL — ABNORMAL LOW (ref 13.0–17.0)
LYMPHS ABS: 1.1 10*3/uL (ref 0.7–4.0)
LYMPHS PCT: 15 % (ref 12–46)
MCH: 32 pg (ref 26.0–34.0)
MCHC: 33.4 g/dL (ref 30.0–36.0)
MCV: 95.7 fL (ref 78.0–100.0)
MONO ABS: 0.5 10*3/uL (ref 0.1–1.0)
MONOS PCT: 7 % (ref 3–12)
Neutro Abs: 5.2 10*3/uL (ref 1.7–7.7)
Neutrophils Relative %: 73 % (ref 43–77)
PLATELETS: 264 10*3/uL (ref 150–400)
RBC: 3.47 MIL/uL — AB (ref 4.22–5.81)
RDW: 12.6 % (ref 11.5–15.5)
WBC: 7.2 10*3/uL (ref 4.0–10.5)

## 2014-08-14 LAB — VANCOMYCIN, TROUGH: Vancomycin Tr: 18 ug/mL (ref 10.0–20.0)

## 2014-08-14 IMAGING — CR DG ABDOMEN 1V
4 series · 4 of 4 positions shown · non-contrast
Comparison: [DATE].  CT [DATE].

CLINICAL DATA: Abdominal pain and distention.

EXAM:
ABDOMEN - 1 VIEW

[abdomen kub (1 of 4)]
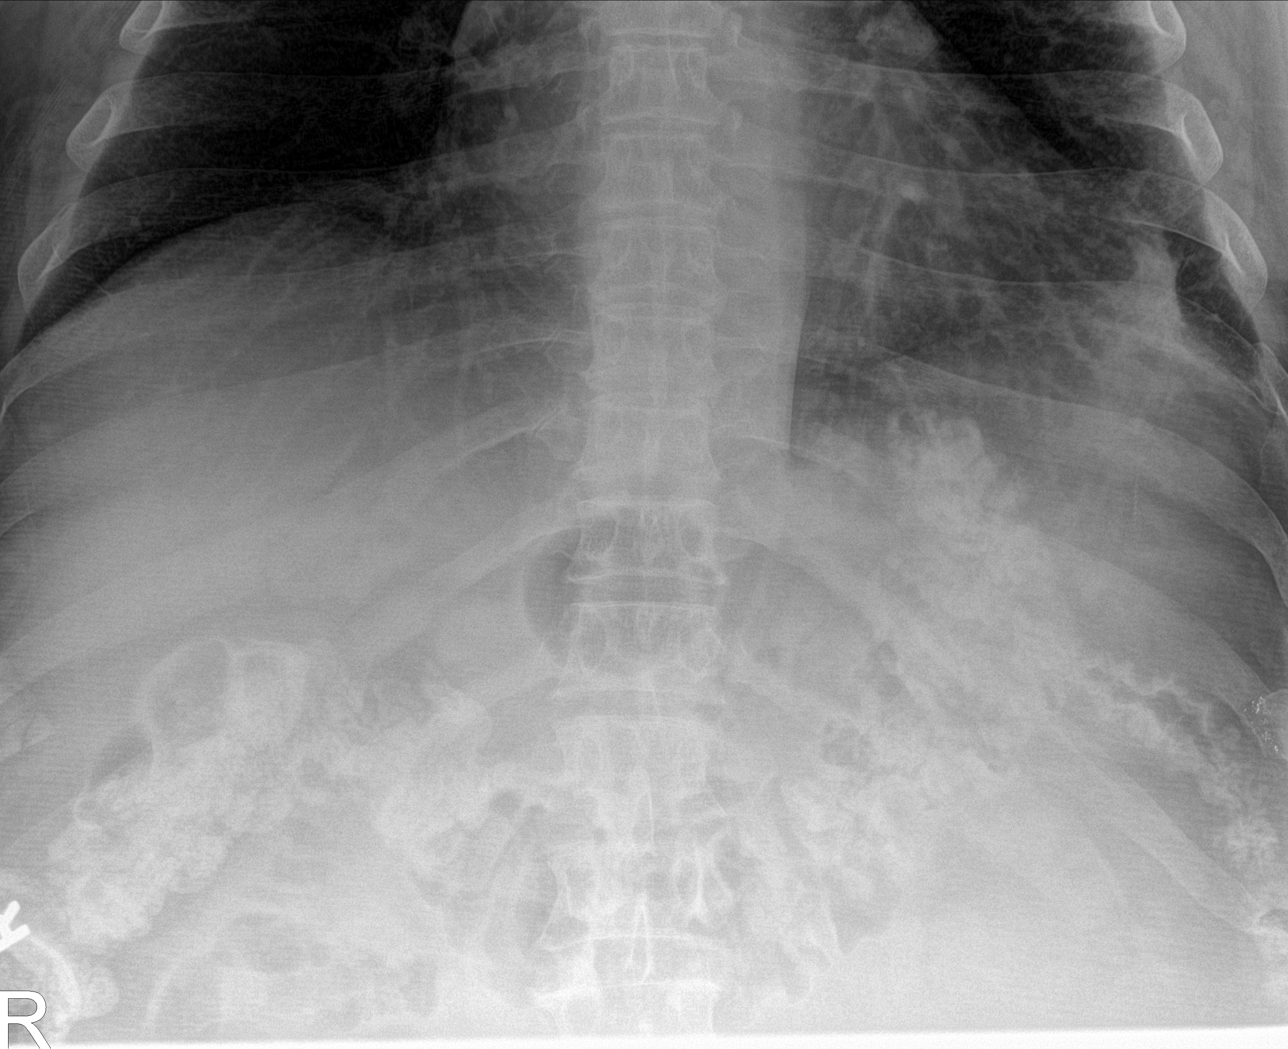

[abdomen kub (2 of 4)]
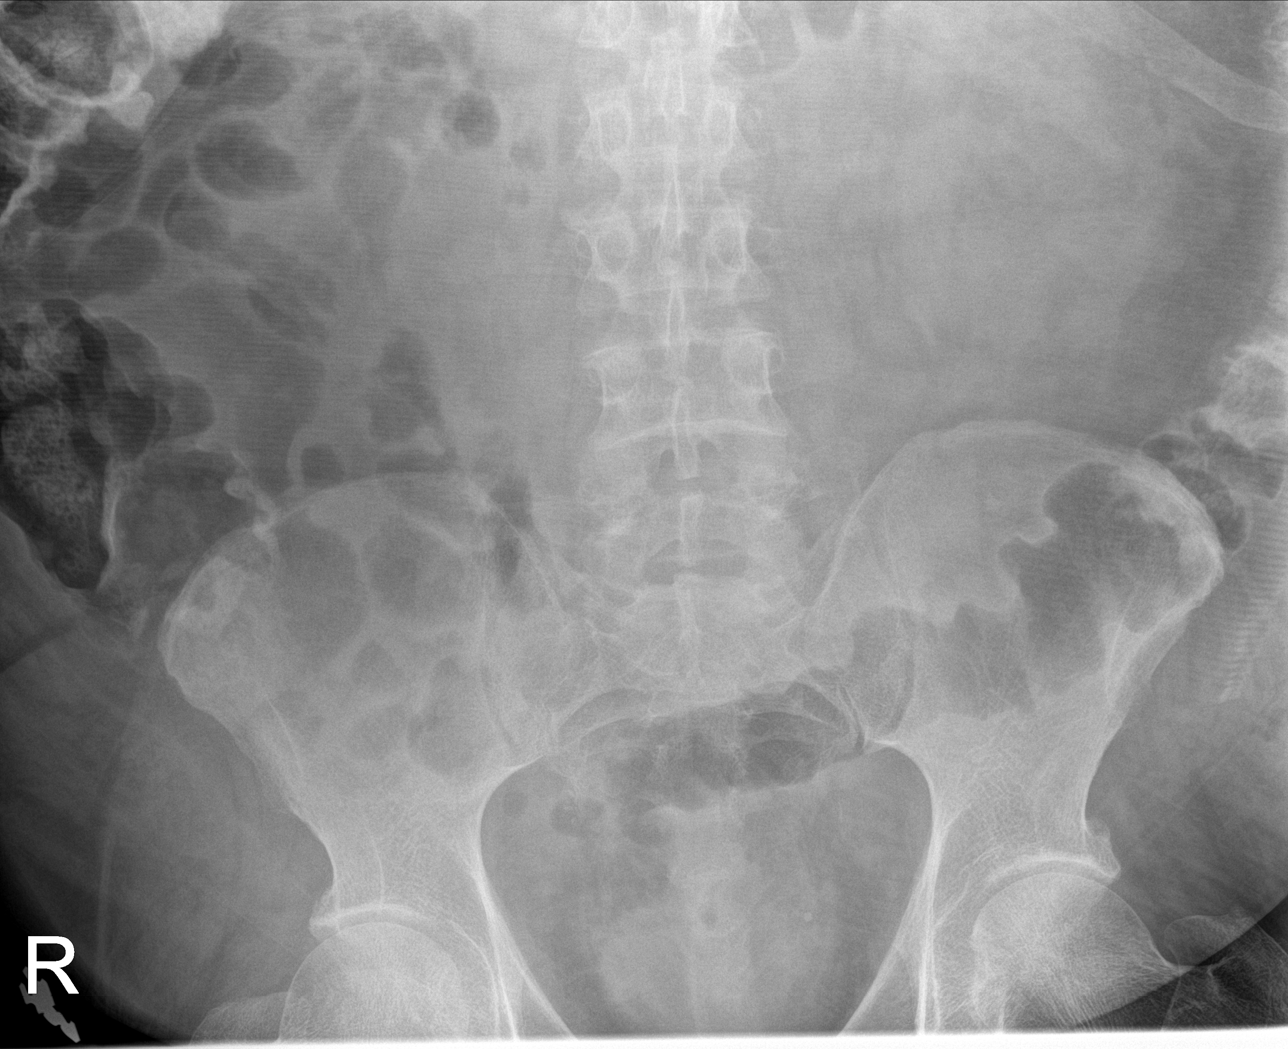

[abdomen kub (3 of 4)]
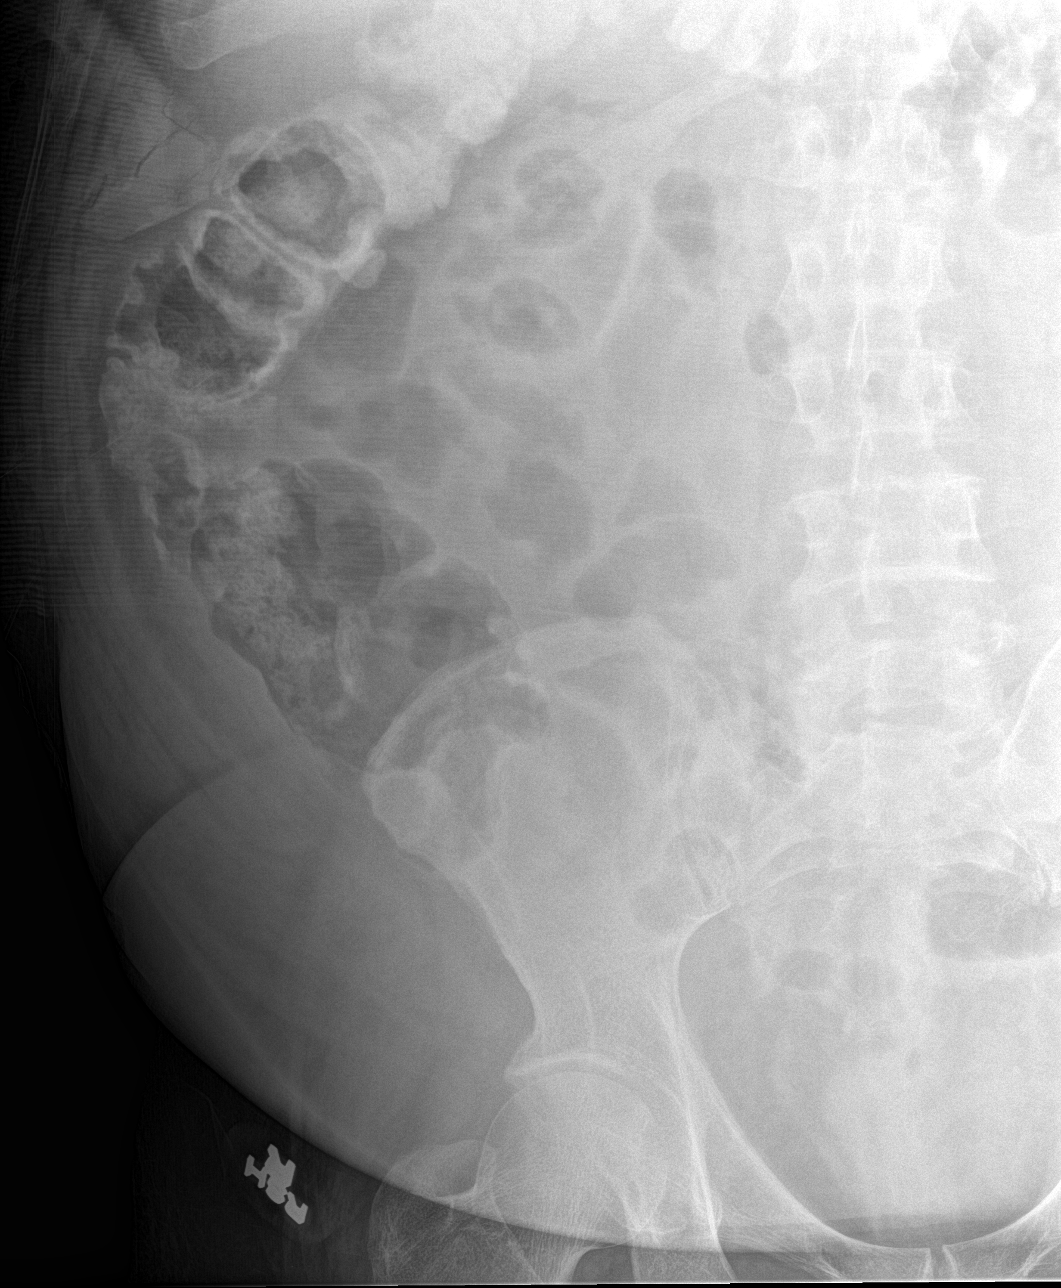

[abdomen kub (4 of 4)]
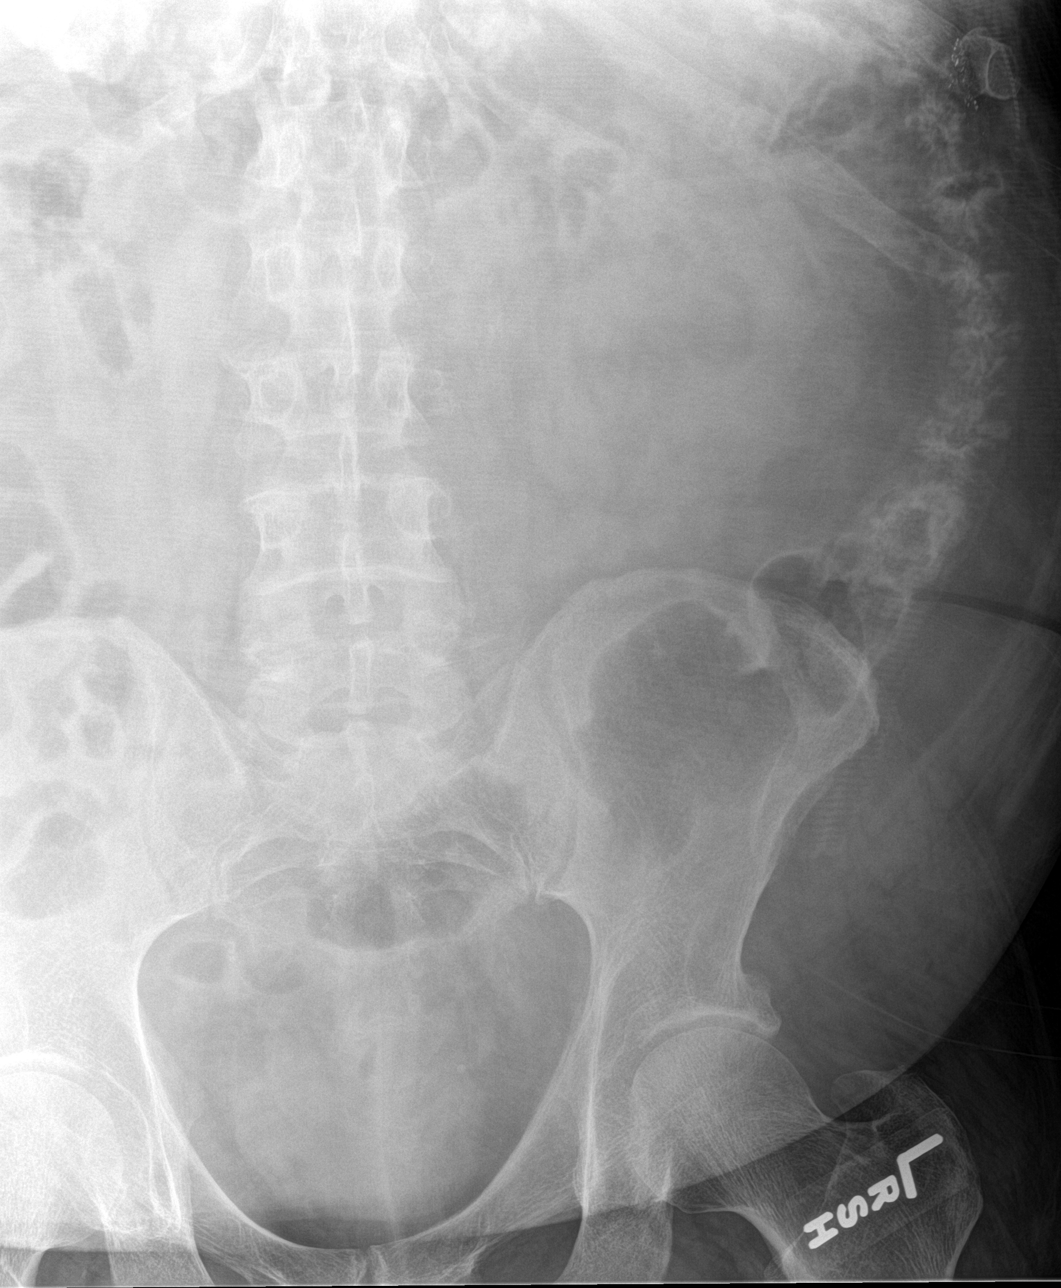

[4 of 4 positions shown; findings below may reference images not displayed]

FINDINGS: Interim resolution of colonic distention. Density noted over the
right upper quadrant. The density is projected outside of the
patient. No free air noted. Previously identified pneumatosis is not
identified. Oral contrast is noted throughout the colon. Minimal
sigmoid colonic wall thickening cannot be excluded. This may be
secondary to nondistended state. Prominent density is noted in the
left lower lobe. This could represent a focal area of
atelectasis/scarring, pneumonia, or mass lesion.
IMPRESSION: 1. Interim resolution of colonic ileus. Oral contrast noted
throughout the colon. Previously identified pneumatosis not
visualized. No free air.
2. Mild sigmoid colonic wall thickening noted. This may be from
nondistended state however inflammatory changes of the sigmoid colon
cannot be excluded.
3. Ill-defined density noted in the left lower lobe. This could
represent a focal area of atelectasis/scarring, pneumonia, or mass
lesion. PA and lateral chest x-ray suggested for further evaluation.

## 2014-08-14 MED ORDER — DOXYCYCLINE HYCLATE 100 MG PO TABS
100.0000 mg | ORAL_TABLET | Freq: Two times a day (BID) | ORAL | Status: DC
  Administered 2014-08-14 (×2): 100 mg via ORAL
  Filled 2014-08-14 (×2): qty 1

## 2014-08-14 MED ORDER — METOPROLOL SUCCINATE ER 25 MG PO TB24
25.0000 mg | ORAL_TABLET | Freq: Every day | ORAL | Status: DC
  Administered 2014-08-14: 25 mg via ORAL
  Filled 2014-08-14: qty 1

## 2014-08-14 MED ORDER — TRAMADOL HCL 50 MG PO TABS
50.0000 mg | ORAL_TABLET | Freq: Four times a day (QID) | ORAL | Status: DC | PRN
  Administered 2014-08-14 – 2014-08-15 (×3): 50 mg via ORAL
  Filled 2014-08-14 (×3): qty 1
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
## 2014-08-14 NOTE — Progress Notes (Signed)
States he feels a little better. Feels like his stools are forming up a little.  Physical: He is in no distress  Sitting up in bed and getting ready to walk down the hall.  Abdomen nontender  Impression: Diarrhea, seems to be improving  Plan: Observation, continue current management.

## 2014-08-15 LAB — BASIC METABOLIC PANEL
Anion gap: 9 (ref 5–15)
BUN: 6 mg/dL (ref 6–20)
CALCIUM: 8.6 mg/dL — AB (ref 8.9–10.3)
CHLORIDE: 104 mmol/L (ref 101–111)
CO2: 26 mmol/L (ref 22–32)
CREATININE: 1.09 mg/dL (ref 0.61–1.24)
GFR calc Af Amer: >60 mL/min (ref >60)
Glucose, Bld: 98 mg/dL (ref 65–99)
POTASSIUM: 4 mmol/L (ref 3.5–5.1)
SODIUM: 139 mmol/L (ref 135–145)

## 2014-08-15 MED ORDER — TRAMADOL HCL 50 MG PO TABS: 50.0000 mg | ORAL_TABLET | Freq: Four times a day (QID) | ORAL | Status: DC | PRN

## 2014-08-15 MED ORDER — DOXYCYCLINE HYCLATE 100 MG PO TABS: 100.0000 mg | ORAL_TABLET | Freq: Two times a day (BID) | ORAL | Status: DC

## 2014-08-15 MED ORDER — SACCHAROMYCES BOULARDII 250 MG PO CAPS: 250.0000 mg | ORAL_CAPSULE | Freq: Two times a day (BID) | ORAL | Status: DC
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
## 2014-08-15 NOTE — Discharge Instructions (Signed)
-  see above 

## 2014-08-19 LAB — GI PATHOGEN PANEL BY PCR, STOOL

## 2014-08-27 ENCOUNTER — Encounter: Payer: Self-pay | Admitting: Gastroenterology

## 2014-09-04 ENCOUNTER — Ambulatory Visit: Payer: PPO | Admitting: Cardiology

## 2014-09-05 ENCOUNTER — Encounter: Payer: Self-pay | Admitting: Nurse Practitioner

## 2014-09-22 ENCOUNTER — Encounter: Payer: Self-pay | Admitting: Nurse Practitioner

## 2014-09-22 ENCOUNTER — Encounter: Payer: Self-pay | Admitting: Cardiology

## 2014-09-22 ENCOUNTER — Telehealth: Payer: Self-pay | Admitting: *Deleted

## 2014-09-22 ENCOUNTER — Ambulatory Visit (INDEPENDENT_AMBULATORY_CARE_PROVIDER_SITE_OTHER): Payer: PPO | Admitting: Nurse Practitioner

## 2014-09-22 VITALS — BP 72/44 | HR 104 | Ht 68.25 in | Wt 250.2 lb

## 2014-09-22 DIAGNOSIS — R197 Diarrhea, unspecified: Secondary | ICD-10-CM

## 2014-09-22 DIAGNOSIS — Z86010 Personal history of colonic polyps: Secondary | ICD-10-CM

## 2014-09-22 DIAGNOSIS — Z7901 Long term (current) use of anticoagulants: Secondary | ICD-10-CM | POA: Diagnosis not present

## 2014-09-22 MED ORDER — NA SULFATE-K SULFATE-MG SULF 17.5-3.13-1.6 GM/177ML PO SOLN: ORAL | Status: DC

## 2014-09-22 NOTE — Telephone Encounter (Signed)
  09/22/2014   RE: CALEL PISARSKI DOB: 1955-07-25 MRN: [id number]   Dear Dr. Glenetta Hew,    We have scheduled the above patient for an endoscopic procedure. Our records show that he is on anticoagulation therapy.   Please advise as to how long the patient may come off his therapy of Plavix prior to the procedure, which is scheduled for 11-03-2014.  Please fax back/ or route the completed form to [facility] at [phone].   Sincerely,    Tye Savoy NP-C
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
## 2014-09-22 NOTE — Patient Instructions (Addendum)
You have been scheduled for a colonoscopy. Please follow written instructions given to you at your visit today.  Please pick up your prep supplies at the pharmacy within the next 1-3 days. [facility] [address] [employer] [address] If you use inhalers (even only as needed), please bring them with you on the day of your procedure. Your physician has requested that you go to www.startemmi.com and enter the access code given to you at your visit today. This web site gives a general overview about your procedure. However, you should still follow specific instructions given to you by our office regarding your preparation for the procedure.   We will call you once we hear from Dr. Ellyn Hack regarding the Plavix medication instructions for the colonosocpy.      Normal BMI (Body Mass Index- based on height and weight) is between 19 and 25. Your BMI today is Body mass index is 37.75 kg/(m^2). [name] Please consider follow up  regarding your BMI with your Primary Care Provider.

## 2014-09-23 ENCOUNTER — Encounter: Payer: Self-pay | Admitting: Nurse Practitioner

## 2014-09-23 DIAGNOSIS — Z860100 Personal history of colon polyps, unspecified: Secondary | ICD-10-CM | POA: Insufficient documentation

## 2014-09-23 DIAGNOSIS — Z7901 Long term (current) use of anticoagulants: Secondary | ICD-10-CM | POA: Insufficient documentation

## 2014-09-24 NOTE — Telephone Encounter (Signed)
OK to hold Plavix 5-7 days pre-procedure & restart when safe.  [place]

## 2014-09-25 NOTE — Telephone Encounter (Signed)
Spoke to patient advising him to be off the Plavix 5 days prior to the procedure date and including 8-29 ( 6 days total).  Advised he can resume on 8-30 unless told otherwise by Dr. Hilarie Fredrickson after the procedure.Patient verbalized understanding the Plavix instructions.

## 2014-11-03 ENCOUNTER — Ambulatory Visit (AMBULATORY_SURGERY_CENTER): Payer: PPO | Admitting: Internal Medicine

## 2014-11-03 ENCOUNTER — Encounter: Payer: Self-pay | Admitting: Internal Medicine

## 2014-11-03 VITALS — BP 102/52 | HR 100 | Temp 98.4°F | Resp 16 | Ht 68.25 in | Wt 250.0 lb

## 2014-11-03 DIAGNOSIS — Z86010 Personal history of colonic polyps: Secondary | ICD-10-CM | POA: Diagnosis not present

## 2014-11-03 DIAGNOSIS — D127 Benign neoplasm of rectosigmoid junction: Secondary | ICD-10-CM

## 2014-11-03 MED ORDER — SODIUM CHLORIDE 0.9 % IV SOLN: 500.0000 mL | INTRAVENOUS | Status: DC

## 2014-11-03 NOTE — Patient Instructions (Signed)
Discharge instructions given. Handouts on polyps and diverticulosis. Resume previous medications. YOU HAD AN ENDOSCOPIC PROCEDURE TODAY AT THE [facility]:   Refer to the procedure report that was given to you for any specific questions about what was found during the examination.  If the procedure report does not answer your questions, please call your gastroenterologist to clarify.  If you requested that your care partner not be given the details of your procedure findings, then the procedure report has been included in a sealed envelope for you to review at your convenience later.  YOU SHOULD EXPECT: Some feelings of bloating in the abdomen. Passage of more gas than usual.  Walking can help get rid of the air that was put into your GI tract during the procedure and reduce the bloating. If you had a lower endoscopy (such as a colonoscopy or flexible sigmoidoscopy) you may notice spotting of blood in your stool or on the toilet paper. If you underwent a bowel prep for your procedure, you may not have a normal bowel movement for a few days.  Please Note:  You might notice some irritation and congestion in your nose or some drainage.  This is from the oxygen used during your procedure.  There is no need for concern and it should clear up in a day or so.  SYMPTOMS TO REPORT IMMEDIATELY:   Following lower endoscopy (colonoscopy or flexible sigmoidoscopy):  Excessive amounts of blood in the stool  Significant tenderness or worsening of abdominal pains  Swelling of the abdomen that is new, acute  Fever of 100F or higher   For urgent or emergent issues, a gastroenterologist can be reached at any hour by calling [phone].   DIET: Your first meal following the procedure should be a small meal and then it is ok to progress to your normal diet. Heavy or fried foods are harder to digest and may make you feel nauseous or bloated.  Likewise, meals heavy in dairy and vegetables can  increase bloating.  Drink plenty of fluids but you should avoid alcoholic beverages for 24 hours.  ACTIVITY:  You should plan to take it easy for the rest of today and you should NOT DRIVE or use heavy machinery until tomorrow (because of the sedation medicines used during the test).    FOLLOW UP: Our staff will call the number listed on your records the next business day following your procedure to check on you and address any questions or concerns that you may have regarding the information given to you following your procedure. If we do not reach you, we will leave a message.  However, if you are feeling well and you are not experiencing any problems, there is no need to return our call.  We will assume that you have returned to your regular daily activities without incident.  If any biopsies were taken you will be contacted by phone or by letter within the next 1-3 weeks.  Please call us at [phone] if you have not heard about the biopsies in 3 weeks.    SIGNATURES/CONFIDENTIALITY: You and/or your care partner have signed paperwork which will be entered into your electronic medical record.  These signatures attest to the fact that that the information above on your After Visit Summary has been reviewed and is understood.  Full responsibility of the confidentiality of this discharge information lies with you and/or your care-partner. 

## 2014-11-03 NOTE — Op Note (Signed)
[facility]  [address] [place], [address]   COLONOSCOPY PROCEDURE REPORT  PATIENT: [name], [name]  MR#: [id number] BIRTHDATE: 09-08-1955 , 23  yrs. old GENDER: male ENDOSCOPIST: Jerene Bears, MD REFERRED XK:GYJEHU Wilson Singer, M.D. PROCEDURE DATE:  11/03/2014 PROCEDURE:   Colonoscopy, surveillance , Colonoscopy with cold biopsy polypectomy, and Colonoscopy with snare polypectomy First Screening Colonoscopy - Avg.  risk and is 50 yrs.  old or older - No.  Prior Negative Screening - Now for repeat screening. N/A  History of Adenoma - Now for follow-up colonoscopy & has been > or = to 3 yrs.  Yes hx of adenoma.  Has been 3 or more years since last colonoscopy.  Polyps removed today? Yes Polyps Removed Today ASA CLASS:   Class III INDICATIONS:Surveillance due to prior colonic neoplasia and PH Colon Adenoma (Dr. Lajoyce Corners). MEDICATIONS: Monitored anesthesia care, Propofol 220 mg IV, ephedrine 40 mg IV, and Lidocaine 200 mg IV  DESCRIPTION OF PROCEDURE:   After the risks benefits and alternatives of the procedure were thoroughly explained, informed consent was obtained.  The digital rectal exam revealed no rectal mass.   The LB [id number]  endoscope was introduced through the anus and advanced to the cecum, which was identified by both the appendix and ileocecal valve. No adverse events experienced. The quality of the prep was (Suprep was used) fair.  The instrument was then slowly withdrawn as the colon was fully examined. Estimated blood loss is zero unless otherwise noted in this procedure report.  COLON FINDINGS: Two sessile polyps ranging between 3-83m in size were found in the rectosigmoid colon.  Polypectomies were performed with a cold snare (1) and with cold forceps (1).  The resection was complete, the polyp tissue was completely retrieved and sent to histology.   There was mild diverticulosis noted in the sigmoid colon.  Retroflexed views revealed  internal hemorrhoids. The time to cecum = 2.1 Withdrawal time = 15.8   The scope was withdrawn and the procedure completed. COMPLICATIONS: There were no immediate complications.  ENDOSCOPIC IMPRESSION: 1.   Two sessile polyps ranging between 3-522min size were found in the rectosigmoid colon; polypectomies were performed with a cold snare and with cold forceps 2.   Mild diverticulosis was noted in the sigmoid colon  RECOMMENDATIONS: 1.  Await pathology results 2.  High fiber diet 3.  Repeat Colonoscopy in 5 years. 4.  You will receive a letter within 1-2 weeks with the results of your biopsy as well as final recommendations.  Please call my office if you have not received a letter after 3 weeks.  eSigned:  JaJerene BearsMD 11/03/2014 2:14 PM  cc: DeGareth EagleMD and The Patient

## 2014-11-03 NOTE — Progress Notes (Signed)
Patient states that he had vomiting. States that he is very tired.  Denied the need to use his inhaler although his o2 sat was 89-91.  Appears disheveled and pale. Denies pain.

## 2014-11-03 NOTE — Progress Notes (Signed)
Report to PACU, RN, vss, BBS= Clear.  

## 2014-11-04 ENCOUNTER — Telehealth: Payer: Self-pay | Admitting: *Deleted

## 2014-11-04 NOTE — Telephone Encounter (Signed)
  Follow up Call-  Call back number 11/03/2014  Post procedure Call Back phone  # [phone]  Permission to leave phone message No     Patient questions:  Do you have a fever, pain , or abdominal swelling? No. Pain Score  0 *  Have you tolerated food without any problems? Yes.    Have you been able to return to your normal activities? Yes.    Do you have any questions about your discharge instructions: Diet   No. Medications  No. Follow up visit  No.  Do you have questions or concerns about your Care? No.  Actions: * If pain score is 4 or above: No action needed, pain <4.

## 2014-11-06 ENCOUNTER — Ambulatory Visit: Payer: PPO | Admitting: Cardiology

## 2014-11-11 ENCOUNTER — Encounter: Payer: Self-pay | Admitting: Internal Medicine

## 2014-11-18 ENCOUNTER — Encounter: Payer: Self-pay | Admitting: Cardiology

## 2014-11-18 ENCOUNTER — Ambulatory Visit (INDEPENDENT_AMBULATORY_CARE_PROVIDER_SITE_OTHER): Payer: PPO | Admitting: Cardiology

## 2014-11-18 VITALS — BP 110/70 | HR 80 | Ht 68.25 in | Wt 257.9 lb

## 2014-11-18 DIAGNOSIS — E785 Hyperlipidemia, unspecified: Secondary | ICD-10-CM

## 2014-11-18 DIAGNOSIS — R600 Localized edema: Secondary | ICD-10-CM

## 2014-11-18 DIAGNOSIS — Z716 Tobacco abuse counseling: Secondary | ICD-10-CM

## 2014-11-18 DIAGNOSIS — I739 Peripheral vascular disease, unspecified: Secondary | ICD-10-CM

## 2014-11-18 DIAGNOSIS — I10 Essential (primary) hypertension: Secondary | ICD-10-CM

## 2014-11-18 DIAGNOSIS — Z9861 Coronary angioplasty status: Secondary | ICD-10-CM

## 2014-11-18 DIAGNOSIS — I2510 Atherosclerotic heart disease of native coronary artery without angina pectoris: Secondary | ICD-10-CM | POA: Diagnosis not present

## 2014-11-18 NOTE — Patient Instructions (Signed)
NO CHANGE IN MEDICTIONS  Your physician wants you to follow-up in [place]. You will receive a reminder letter in the mail two months in advance. If you don't receive a letter, please call our office to schedule the follow-up appointment.
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
## 2014-11-20 ENCOUNTER — Encounter: Payer: Self-pay | Admitting: Cardiology

## 2014-11-20 DIAGNOSIS — I739 Peripheral vascular disease, unspecified: Secondary | ICD-10-CM | POA: Insufficient documentation

## 2014-11-20 NOTE — Assessment & Plan Note (Signed)
He does have some is a concerning for possible claudication. He doesn't really walk a lot, so we talked about the importance of increasing his walking will to better assess this truly is claudication. Low threshold for checking lower extremities arterial Dopplers.

## 2014-11-20 NOTE — Assessment & Plan Note (Signed)
Negative Myoview in April 2016. This would indicate patent stents in the LAD and RCA (Cypher and Taxus). On aspirin plus Plavix, but would be okay to stop aspirin if necessary as well as Plavix if needed for procedures. On ACE inhibitor, statin, not beta blocker due to COPD.  He is also on Imdur pole is thought to be possibly microvascular disease, and has noticed an improvement in symptoms. -- Okay to stop aspirin

## 2014-11-20 NOTE — Assessment & Plan Note (Signed)
We talked briefly about smoking cessation, he does not seem to be very receptive, stated he is failed quitting several times.

## 2014-11-20 NOTE — Assessment & Plan Note (Signed)
Well-controlled currently. No new change. Monitor for signs of hypotension.

## 2014-11-20 NOTE — Assessment & Plan Note (Signed)
Not sure whether this related to venous insufficiency or mild diastolic heart failure. He doesn't have PND and orthopnea, so suggest that has not left heart related. Both we will control the standing dose of Lasix. He is also replace his potassium. We discussedsliding scale Lasix, but he was to take increasing doses of Lasix, he should take additional potassium.

## 2014-11-20 NOTE — Assessment & Plan Note (Signed)
Lipids  Have been managed by his PCP. He is on Crestor 20 mg daily. Unfortunately do not have labs to followup on.

## 2015-03-17 DIAGNOSIS — I2510 Atherosclerotic heart disease of native coronary artery without angina pectoris: Secondary | ICD-10-CM | POA: Diagnosis not present

## 2015-03-17 DIAGNOSIS — Z125 Encounter for screening for malignant neoplasm of prostate: Secondary | ICD-10-CM | POA: Diagnosis not present

## 2015-03-17 DIAGNOSIS — M549 Dorsalgia, unspecified: Secondary | ICD-10-CM | POA: Diagnosis not present

## 2015-03-17 DIAGNOSIS — Z0000 Encounter for general adult medical examination without abnormal findings: Secondary | ICD-10-CM | POA: Diagnosis not present

## 2015-03-17 DIAGNOSIS — I10 Essential (primary) hypertension: Secondary | ICD-10-CM | POA: Diagnosis not present

## 2015-03-17 DIAGNOSIS — E789 Disorder of lipoprotein metabolism, unspecified: Secondary | ICD-10-CM | POA: Diagnosis not present

## 2015-06-15 DIAGNOSIS — K5900 Constipation, unspecified: Secondary | ICD-10-CM | POA: Diagnosis not present

## 2015-06-15 DIAGNOSIS — M549 Dorsalgia, unspecified: Secondary | ICD-10-CM | POA: Diagnosis not present

## 2015-06-15 DIAGNOSIS — E789 Disorder of lipoprotein metabolism, unspecified: Secondary | ICD-10-CM | POA: Diagnosis not present

## 2015-06-15 DIAGNOSIS — I10 Essential (primary) hypertension: Secondary | ICD-10-CM | POA: Diagnosis not present

## 2015-06-15 DIAGNOSIS — F419 Anxiety disorder, unspecified: Secondary | ICD-10-CM | POA: Diagnosis not present

## 2015-07-20 DIAGNOSIS — J189 Pneumonia, unspecified organism: Secondary | ICD-10-CM | POA: Diagnosis not present

## 2015-07-20 DIAGNOSIS — R05 Cough: Secondary | ICD-10-CM | POA: Diagnosis not present

## 2015-09-14 DIAGNOSIS — M549 Dorsalgia, unspecified: Secondary | ICD-10-CM | POA: Diagnosis not present

## 2015-09-14 DIAGNOSIS — F4320 Adjustment disorder, unspecified: Secondary | ICD-10-CM | POA: Diagnosis not present

## 2015-12-16 DIAGNOSIS — E789 Disorder of lipoprotein metabolism, unspecified: Secondary | ICD-10-CM | POA: Diagnosis not present

## 2015-12-16 DIAGNOSIS — I10 Essential (primary) hypertension: Secondary | ICD-10-CM | POA: Diagnosis not present

## 2015-12-16 DIAGNOSIS — F4320 Adjustment disorder, unspecified: Secondary | ICD-10-CM | POA: Diagnosis not present

## 2015-12-16 DIAGNOSIS — M549 Dorsalgia, unspecified: Secondary | ICD-10-CM | POA: Diagnosis not present

## 2015-12-16 DIAGNOSIS — Z23 Encounter for immunization: Secondary | ICD-10-CM | POA: Diagnosis not present

## 2016-01-05 DIAGNOSIS — M549 Dorsalgia, unspecified: Secondary | ICD-10-CM | POA: Diagnosis not present

## 2016-01-05 DIAGNOSIS — F4320 Adjustment disorder, unspecified: Secondary | ICD-10-CM | POA: Diagnosis not present

## 2016-01-05 DIAGNOSIS — R300 Dysuria: Secondary | ICD-10-CM | POA: Diagnosis not present

## 2016-01-26 DIAGNOSIS — F329 Major depressive disorder, single episode, unspecified: Secondary | ICD-10-CM | POA: Diagnosis not present

## 2016-01-26 DIAGNOSIS — F4320 Adjustment disorder, unspecified: Secondary | ICD-10-CM | POA: Diagnosis not present

## 2016-03-03 ENCOUNTER — Encounter (HOSPITAL_COMMUNITY): Payer: Self-pay

## 2016-03-03 ENCOUNTER — Emergency Department (HOSPITAL_COMMUNITY)
Admission: EM | Admit: 2016-03-03 | Discharge: 2016-03-03 | Disposition: A | Discharge status: Home / Self Care | Payer: PPO | Attending: Emergency Medicine | Admitting: Emergency Medicine

## 2016-03-03 DIAGNOSIS — T2122XA Burn of second degree of abdominal wall, initial encounter: Secondary | ICD-10-CM | POA: Insufficient documentation

## 2016-03-03 DIAGNOSIS — J449 Chronic obstructive pulmonary disease, unspecified: Secondary | ICD-10-CM | POA: Insufficient documentation

## 2016-03-03 DIAGNOSIS — T310 Burns involving less than 10% of body surface: Secondary | ICD-10-CM | POA: Diagnosis not present

## 2016-03-03 DIAGNOSIS — I2510 Atherosclerotic heart disease of native coronary artery without angina pectoris: Secondary | ICD-10-CM | POA: Insufficient documentation

## 2016-03-03 DIAGNOSIS — Z955 Presence of coronary angioplasty implant and graft: Secondary | ICD-10-CM | POA: Diagnosis not present

## 2016-03-03 DIAGNOSIS — F17210 Nicotine dependence, cigarettes, uncomplicated: Secondary | ICD-10-CM | POA: Diagnosis not present

## 2016-03-03 DIAGNOSIS — Z7982 Long term (current) use of aspirin: Secondary | ICD-10-CM | POA: Diagnosis not present

## 2016-03-03 DIAGNOSIS — T300 Burn of unspecified body region, unspecified degree: Secondary | ICD-10-CM

## 2016-03-03 DIAGNOSIS — Z79899 Other long term (current) drug therapy: Secondary | ICD-10-CM | POA: Diagnosis not present

## 2016-03-03 DIAGNOSIS — Y92000 Kitchen of unspecified non-institutional (private) residence as  the place of occurrence of the external cause: Secondary | ICD-10-CM | POA: Diagnosis not present

## 2016-03-03 DIAGNOSIS — Y93G3 Activity, cooking and baking: Secondary | ICD-10-CM | POA: Diagnosis not present

## 2016-03-03 DIAGNOSIS — L03311 Cellulitis of abdominal wall: Secondary | ICD-10-CM | POA: Diagnosis not present

## 2016-03-03 DIAGNOSIS — X12XXXA Contact with other hot fluids, initial encounter: Secondary | ICD-10-CM | POA: Insufficient documentation

## 2016-03-03 DIAGNOSIS — Y999 Unspecified external cause status: Secondary | ICD-10-CM | POA: Insufficient documentation

## 2016-03-03 DIAGNOSIS — T2102XA Burn of unspecified degree of abdominal wall, initial encounter: Secondary | ICD-10-CM | POA: Diagnosis not present

## 2016-03-03 LAB — BASIC METABOLIC PANEL
ANION GAP: 8 (ref 5–15)
BUN: 12 mg/dL (ref 6–20)
CHLORIDE: 104 mmol/L (ref 101–111)
CO2: 23 mmol/L (ref 22–32)
Calcium: 9 mg/dL (ref 8.9–10.3)
Creatinine, Ser: 0.89 mg/dL (ref 0.61–1.24)
Glucose, Bld: 124 mg/dL — ABNORMAL HIGH (ref 65–99)
POTASSIUM: 4.8 mmol/L (ref 3.5–5.1)
SODIUM: 135 mmol/L (ref 135–145)

## 2016-03-03 LAB — CBC WITH DIFFERENTIAL/PLATELET
BASOS ABS: 0 10*3/uL (ref 0.0–0.1)
Basophils Relative: 0 %
EOS ABS: 0.1 10*3/uL (ref 0.0–0.7)
EOS PCT: 1 %
HCT: 36.2 % — ABNORMAL LOW (ref 39.0–52.0)
HEMOGLOBIN: 11.7 g/dL — AB (ref 13.0–17.0)
LYMPHS ABS: 1 10*3/uL (ref 0.7–4.0)
Lymphocytes Relative: 11 %
MCH: 31 pg (ref 26.0–34.0)
MCHC: 32.3 g/dL (ref 30.0–36.0)
MCV: 96 fL (ref 78.0–100.0)
Monocytes Absolute: 0.9 10*3/uL (ref 0.1–1.0)
Monocytes Relative: 11 %
NEUTROS PCT: 77 %
Neutro Abs: 6.7 10*3/uL (ref 1.7–7.7)
PLATELETS: 261 10*3/uL (ref 150–400)
RBC: 3.77 MIL/uL — AB (ref 4.22–5.81)
RDW: 13.7 % (ref 11.5–15.5)
WBC: 8.7 10*3/uL (ref 4.0–10.5)

## 2016-03-03 LAB — I-STAT CG4 LACTIC ACID, ED: Lactic Acid, Venous: 0.73 mmol/L (ref 0.5–1.9)

## 2016-03-03 MED ORDER — SODIUM CHLORIDE 0.9 % IV BOLUS (SEPSIS)
500.0000 mL | Freq: Once | INTRAVENOUS | Status: AC
  Administered 2016-03-03: 500 mL via INTRAVENOUS

## 2016-03-03 MED ORDER — CEPHALEXIN 500 MG PO CAPS: 500.0000 mg | ORAL_CAPSULE | Freq: Two times a day (BID) | ORAL | 0 refills | Status: AC

## 2016-03-03 MED ORDER — SILVER SULFADIAZINE 1 % EX CREA
TOPICAL_CREAM | Freq: Once | CUTANEOUS | Status: AC
  Administered 2016-03-03: 11:00:00 via TOPICAL
  Filled 2016-03-03: qty 85

## 2016-03-03 MED ORDER — CEFAZOLIN IN D5W 1 GM/50ML IV SOLN
1.0000 g | Freq: Once | INTRAVENOUS | Status: AC
  Administered 2016-03-03: 1 g via INTRAVENOUS
  Filled 2016-03-03: qty 50

## 2016-03-03 MED ORDER — HYDROMORPHONE HCL 2 MG/ML IJ SOLN
2.0000 mg | Freq: Once | INTRAMUSCULAR | Status: DC
  Filled 2016-03-03: qty 1

## 2016-03-03 MED ORDER — HYDROMORPHONE HCL 2 MG/ML IJ SOLN
1.0000 mg | Freq: Once | INTRAMUSCULAR | Status: DC
Start: 2016-03-03 — End: 2016-03-03

## 2016-03-03 MED ORDER — MORPHINE SULFATE (PF) 4 MG/ML IV SOLN
6.0000 mg | Freq: Once | INTRAVENOUS | Status: AC
  Administered 2016-03-03: 6 mg via INTRAVENOUS
  Filled 2016-03-03: qty 2

## 2016-03-03 MED ORDER — SILVER SULFADIAZINE 1 % EX CREA: 1.0000 "application " | TOPICAL_CREAM | Freq: Two times a day (BID) | CUTANEOUS | 0 refills | Status: DC

## 2016-03-03 NOTE — Discharge Planning (Signed)
Pt up for discharge/transfer to another facility. [facility] reviewed chart for possible CM needs; will continue to monitor

## 2016-03-03 NOTE — ED Notes (Signed)
Paged [facility]
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
## 2016-03-07 ENCOUNTER — Encounter (HOSPITAL_COMMUNITY): Payer: Self-pay | Admitting: Emergency Medicine

## 2016-03-07 ENCOUNTER — Emergency Department (HOSPITAL_COMMUNITY)
Admission: EM | Admit: 2016-03-07 | Discharge: 2016-03-07 | Disposition: A | Discharge status: Home / Self Care | Payer: PPO | Attending: Emergency Medicine | Admitting: Emergency Medicine

## 2016-03-07 DIAGNOSIS — F17210 Nicotine dependence, cigarettes, uncomplicated: Secondary | ICD-10-CM | POA: Diagnosis not present

## 2016-03-07 DIAGNOSIS — Z955 Presence of coronary angioplasty implant and graft: Secondary | ICD-10-CM | POA: Diagnosis not present

## 2016-03-07 DIAGNOSIS — Z4800 Encounter for change or removal of nonsurgical wound dressing: Secondary | ICD-10-CM | POA: Insufficient documentation

## 2016-03-07 DIAGNOSIS — I10 Essential (primary) hypertension: Secondary | ICD-10-CM | POA: Insufficient documentation

## 2016-03-07 DIAGNOSIS — Z4801 Encounter for change or removal of surgical wound dressing: Secondary | ICD-10-CM | POA: Diagnosis not present

## 2016-03-07 DIAGNOSIS — Z7982 Long term (current) use of aspirin: Secondary | ICD-10-CM | POA: Insufficient documentation

## 2016-03-07 DIAGNOSIS — Z79899 Other long term (current) drug therapy: Secondary | ICD-10-CM | POA: Diagnosis not present

## 2016-03-07 DIAGNOSIS — J449 Chronic obstructive pulmonary disease, unspecified: Secondary | ICD-10-CM | POA: Diagnosis not present

## 2016-03-07 DIAGNOSIS — I2510 Atherosclerotic heart disease of native coronary artery without angina pectoris: Secondary | ICD-10-CM | POA: Insufficient documentation

## 2016-03-07 DIAGNOSIS — Z5189 Encounter for other specified aftercare: Secondary | ICD-10-CM

## 2016-03-07 MED ORDER — SILVER SULFADIAZINE 1 % EX CREA
TOPICAL_CREAM | Freq: Once | CUTANEOUS | Status: AC
  Administered 2016-03-07: 10:00:00 via TOPICAL
  Filled 2016-03-07: qty 85
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
## 2016-03-08 LAB — CULTURE, BLOOD (ROUTINE X 2)
CULTURE: NO GROWTH
Culture: NO GROWTH

## 2016-03-09 ENCOUNTER — Encounter (HOSPITAL_COMMUNITY): Payer: Self-pay | Admitting: Emergency Medicine

## 2016-03-09 ENCOUNTER — Other Ambulatory Visit: Payer: Self-pay

## 2016-03-09 ENCOUNTER — Emergency Department (HOSPITAL_COMMUNITY)
Admission: EM | Admit: 2016-03-09 | Discharge: 2016-03-09 | Disposition: A | Discharge status: Home / Self Care | Payer: PPO | Attending: Emergency Medicine | Admitting: Emergency Medicine

## 2016-03-09 DIAGNOSIS — Z955 Presence of coronary angioplasty implant and graft: Secondary | ICD-10-CM | POA: Insufficient documentation

## 2016-03-09 DIAGNOSIS — Z5189 Encounter for other specified aftercare: Secondary | ICD-10-CM

## 2016-03-09 DIAGNOSIS — Z7982 Long term (current) use of aspirin: Secondary | ICD-10-CM | POA: Insufficient documentation

## 2016-03-09 DIAGNOSIS — J449 Chronic obstructive pulmonary disease, unspecified: Secondary | ICD-10-CM | POA: Insufficient documentation

## 2016-03-09 DIAGNOSIS — Z79899 Other long term (current) drug therapy: Secondary | ICD-10-CM | POA: Diagnosis not present

## 2016-03-09 DIAGNOSIS — Z4800 Encounter for change or removal of nonsurgical wound dressing: Secondary | ICD-10-CM | POA: Diagnosis not present

## 2016-03-09 DIAGNOSIS — I2510 Atherosclerotic heart disease of native coronary artery without angina pectoris: Secondary | ICD-10-CM | POA: Insufficient documentation

## 2016-03-09 DIAGNOSIS — I10 Essential (primary) hypertension: Secondary | ICD-10-CM | POA: Diagnosis not present

## 2016-03-09 DIAGNOSIS — F17210 Nicotine dependence, cigarettes, uncomplicated: Secondary | ICD-10-CM | POA: Diagnosis not present
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
## 2016-03-09 NOTE — ED Notes (Signed)
Papers reviewed and new non stick gauze given to patient so he could get home to his bandages at home
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
## 2016-03-11 ENCOUNTER — Other Ambulatory Visit: Payer: Self-pay | Admitting: Licensed Clinical Social Worker

## 2016-03-11 ENCOUNTER — Encounter: Payer: Self-pay | Admitting: Licensed Clinical Social Worker
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
## 2016-03-14 ENCOUNTER — Other Ambulatory Visit: Payer: Self-pay | Admitting: Licensed Clinical Social Worker

## 2016-03-14 NOTE — Patient Outreach (Signed)
[facility]) Care Management  03/14/2016  [name] [name] 04/17/1955 [id number]   Assessment- CSW completed outreach to patient on 03/14/16 and patient answered. Patient reports that he is feeling "okay" today. CSW reminded patient on upcoming appointment tomorrow and informed him that he will need to be ready by 9:45 am.  Plan-CSW will follow up within one week.  Eula Fried, BSW, MSW, [email or web address] Phone: [phone] Fax: [phone]

## 2016-03-15 ENCOUNTER — Other Ambulatory Visit: Payer: Self-pay | Admitting: Licensed Clinical Social Worker

## 2016-03-15 DIAGNOSIS — I2510 Atherosclerotic heart disease of native coronary artery without angina pectoris: Secondary | ICD-10-CM | POA: Diagnosis not present

## 2016-03-15 DIAGNOSIS — T2122XA Burn of second degree of abdominal wall, initial encounter: Secondary | ICD-10-CM | POA: Diagnosis not present

## 2016-03-15 DIAGNOSIS — Z91041 Radiographic dye allergy status: Secondary | ICD-10-CM | POA: Diagnosis not present

## 2016-03-15 DIAGNOSIS — I10 Essential (primary) hypertension: Secondary | ICD-10-CM | POA: Diagnosis not present

## 2016-03-15 DIAGNOSIS — J449 Chronic obstructive pulmonary disease, unspecified: Secondary | ICD-10-CM | POA: Diagnosis not present

## 2016-03-15 DIAGNOSIS — T2102XD Burn of unspecified degree of abdominal wall, subsequent encounter: Secondary | ICD-10-CM | POA: Diagnosis not present

## 2016-03-15 DIAGNOSIS — X088XXD Exposure to other specified smoke, fire and flames, subsequent encounter: Secondary | ICD-10-CM | POA: Diagnosis not present

## 2016-03-15 DIAGNOSIS — T2122XD Burn of second degree of abdominal wall, subsequent encounter: Secondary | ICD-10-CM | POA: Diagnosis not present
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
## 2016-03-17 DIAGNOSIS — T300 Burn of unspecified body region, unspecified degree: Secondary | ICD-10-CM | POA: Diagnosis not present

## 2016-03-17 DIAGNOSIS — M549 Dorsalgia, unspecified: Secondary | ICD-10-CM | POA: Diagnosis not present

## 2016-03-21 ENCOUNTER — Other Ambulatory Visit: Payer: Self-pay | Admitting: Licensed Clinical Social Worker
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
## 2016-03-23 ENCOUNTER — Other Ambulatory Visit: Payer: Self-pay | Admitting: Licensed Clinical Social Worker
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
## 2016-04-01 ENCOUNTER — Other Ambulatory Visit: Payer: Self-pay | Admitting: Licensed Clinical Social Worker

## 2016-04-01 ENCOUNTER — Encounter: Payer: Self-pay | Admitting: Licensed Clinical Social Worker
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
## 2016-06-15 DIAGNOSIS — I2510 Atherosclerotic heart disease of native coronary artery without angina pectoris: Secondary | ICD-10-CM | POA: Diagnosis not present

## 2016-06-15 DIAGNOSIS — M549 Dorsalgia, unspecified: Secondary | ICD-10-CM | POA: Diagnosis not present

## 2016-06-15 DIAGNOSIS — E789 Disorder of lipoprotein metabolism, unspecified: Secondary | ICD-10-CM | POA: Diagnosis not present

## 2016-06-20 ENCOUNTER — Telehealth (INDEPENDENT_AMBULATORY_CARE_PROVIDER_SITE_OTHER): Payer: Self-pay | Admitting: *Deleted

## 2016-06-20 NOTE — Telephone Encounter (Signed)
Please advise 

## 2016-06-20 NOTE — Telephone Encounter (Signed)
Patient called in this afternoon in regards to wanting to know if Dr. Lorin Mercy had any recommendations for a pain clinic for him. He has severe lower back and hip pain. He has not seen Dr. Lorin Mercy in quite some time he stated. His CB # [phone]. Thank you

## 2016-06-20 NOTE — Telephone Encounter (Signed)
He is on morphine '30mg'$  4 X per day. He can talk with his PCP about getting a lumbar MRI to make sure he does not have pathology causing his pain. If he does he can come see me. I discussed chronic narcotic usage , tolerance, increasing doses which he has done over the years. FYi

## 2016-06-21 NOTE — Telephone Encounter (Signed)
noted 

## 2016-06-22 ENCOUNTER — Ambulatory Visit (INDEPENDENT_AMBULATORY_CARE_PROVIDER_SITE_OTHER): Payer: PPO | Admitting: Cardiology

## 2016-06-22 ENCOUNTER — Encounter: Payer: Self-pay | Admitting: Cardiology

## 2016-06-22 VITALS — BP 142/80 | HR 80 | Ht 68.0 in | Wt 257.0 lb

## 2016-06-22 DIAGNOSIS — I2510 Atherosclerotic heart disease of native coronary artery without angina pectoris: Secondary | ICD-10-CM | POA: Diagnosis not present

## 2016-06-22 DIAGNOSIS — I739 Peripheral vascular disease, unspecified: Secondary | ICD-10-CM | POA: Diagnosis not present

## 2016-06-22 DIAGNOSIS — E785 Hyperlipidemia, unspecified: Secondary | ICD-10-CM

## 2016-06-22 DIAGNOSIS — R079 Chest pain, unspecified: Secondary | ICD-10-CM

## 2016-06-22 DIAGNOSIS — I10 Essential (primary) hypertension: Secondary | ICD-10-CM | POA: Diagnosis not present

## 2016-06-22 DIAGNOSIS — R600 Localized edema: Secondary | ICD-10-CM

## 2016-06-22 DIAGNOSIS — Z720 Tobacco use: Secondary | ICD-10-CM

## 2016-06-22 DIAGNOSIS — Z9861 Coronary angioplasty status: Secondary | ICD-10-CM

## 2016-06-22 DIAGNOSIS — E669 Obesity, unspecified: Secondary | ICD-10-CM

## 2016-06-22 MED ORDER — DILTIAZEM HCL ER COATED BEADS 120 MG PO CP24: 120.0000 mg | ORAL_CAPSULE | Freq: Every day | ORAL | 3 refills | Status: DC

## 2016-06-22 MED ORDER — ROSUVASTATIN CALCIUM 20 MG PO TABS: 20.0000 mg | ORAL_TABLET | Freq: Every day | ORAL | 3 refills | Status: DC

## 2016-06-22 MED ORDER — ISOSORBIDE MONONITRATE ER 30 MG PO TB24
60.0000 mg | ORAL_TABLET | Freq: Two times a day (BID) | ORAL | 3 refills | Status: DC
Start: 2016-06-22 — End: 2016-07-25

## 2016-06-22 MED ORDER — POTASSIUM CHLORIDE CRYS ER 20 MEQ PO TBCR: EXTENDED_RELEASE_TABLET | ORAL | 3 refills | Status: DC

## 2016-06-22 MED ORDER — FUROSEMIDE 40 MG PO TABS: 40.0000 mg | ORAL_TABLET | ORAL | 3 refills | Status: DC | PRN

## 2016-06-22 MED ORDER — LISINOPRIL 10 MG PO TABS: 10.0000 mg | ORAL_TABLET | Freq: Every day | ORAL | 3 refills | Status: DC

## 2016-06-22 MED ORDER — CLOPIDOGREL BISULFATE 75 MG PO TABS: 75.0000 mg | ORAL_TABLET | Freq: Every day | ORAL | 3 refills | Status: DC
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
## 2016-06-22 NOTE — Patient Instructions (Addendum)
Schedule [address] Your physician has requested that you have an ankle brachial index (ABI). During this test an ultrasound and blood pressure cuff are used to evaluate the arteries that supply the arms and legs with blood. Allow thirty minutes for this exam. There are no restrictions or special instructions.  Your physician has requested that you have a lower extremity arterial duplex. This test is an ultrasound of the arteries in the legs. It looks at arterial blood flow in the legs. Allow one hour for Lower and Upper Arterial scans. There are no restrictions or special instructions   Labs in June 2018  cmp lipid Will mail labslip at that time  Medication refilled NEW- DILTIAZEM 120 MG -TAKE DAILY     MEDICATION CHANGES ONLY USE FUROSEMIDE AS NEEDED DAILY FOR SWELLING , TAKE POTASSIUM WITH FUROSEMIDE ONLY     Your physician wants you to follow-up in 12 months with Dr Ellyn Hack.You will receive a reminder letter in the mail two months in advance. If you don't receive a letter, please call our office to schedule the follow-up appointment.  If you need a refill on your cardiac medications before your next appointment, please call your pharmacy.

## 2016-06-24 ENCOUNTER — Encounter: Payer: Self-pay | Admitting: Cardiology

## 2016-06-24 NOTE — Assessment & Plan Note (Signed)
Chest pain episodes is having are concerning for possible angina but there are also some musculoskeletal in nature because he is a very poor historian. The symptoms are not that much different, if not the same as what he is feeling when we evaluated his last stress test. At this point we will continue to monitor, but we may need to have a low threshold to consider reevaluation with an invasive evaluation.

## 2016-06-24 NOTE — Assessment & Plan Note (Signed)
He has lots of different of chest pain that we have evaluated in the past with Myoview stress test that of an negative. That would suggest that his stents are patent. He could very well have some microvascular ischemia or progression of disease. He is on aspirin and Plavix as well as rosuvastatin. He is on 60 twice a day of Imdur which probably can titrate further in addition to his ACE inhibitor. He had not use a beta blocker because of his COPD. I didn't choose Ranexa, partly because of financial issues. Plan for now be to start diltiazem in lieu of beta blocker for additional blood pressure control and antianginal effect.  At some point, if symptoms persist, we may need to consider invasive evaluation, but for now we will treat medically.

## 2016-06-24 NOTE — Assessment & Plan Note (Signed)
He is on moderate dose Crestor/rosuvastatin. I don't see any recent labs, order some labs and timeframe that he can take with him to his PCP so he can get lipids checked along with her function test.

## 2016-06-24 NOTE — Assessment & Plan Note (Signed)
I talked him for a while about smoking cessation. 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

## 2016-06-24 NOTE — Assessment & Plan Note (Signed)
In the past we have talked about checking arterial Dopplers, but he didn't show back up again. Now that he is still noticing some exertional symptoms in his caps and thighs that could be consistent with claudication, will recheck ABIs and potentially lower STEMI arterial Dopplers depending on results. He already has known coronary disease, hypertension, hyperlipidemia obesity and is a smoker making him essentially metabolic syndrome. High risk for PAD

## 2016-06-24 NOTE — Assessment & Plan Note (Signed)
I think is out of furosemide to refill that for him. Intermittent swelling. At this point he doesn't really have a lot of heart failure type symptoms. No PND or orthopnea. I don't think he understood the concept of sliding scale Lasix. At this point I'm specifically is a use Lasix if the edema gets worse or if dyspnea is worse. As time goes by we can readdress the concept of sliding scale.

## 2016-06-24 NOTE — Assessment & Plan Note (Signed)
The patient understands the need to lose weight with diet and exercise. We have discussed specific strategies for this.  

## 2016-06-24 NOTE — Assessment & Plan Note (Signed)
Not quite at goal with current meds. Since were notably his beta blocker due to COPD,, tried diltiazem for rate control, blood pressure and antianginal effect. I would intermittent knee titrate up diltiazem and lisinopril for additional blood pressure control.

## 2016-07-06 ENCOUNTER — Telehealth: Payer: Self-pay | Admitting: Cardiology

## 2016-07-06 NOTE — Telephone Encounter (Signed)
New message    *STAT* If patient is at the pharmacy, call can be transferred to refill team.   1. Which medications need to be refilled? (please list name of each medication and dose if known) Nitroglycerin   2. Which pharmacy/location (including street and city if local pharmacy) is medication to be sent to? [facility]  3. Do they need a 30 day or 90 day supply? 30 day    Pt c/o of Chest Pain: STAT if CP now or developed within 24 hours  1. Are you having CP right now? No  2. Are you experiencing any other symptoms (ex. SOB, nausea, vomiting, sweating)? Sob, real tired real easy  3. How long have you been experiencing CP? Since he saw Dr. Ellyn Hack  4. Is your CP continuous or coming and going? Coming and going  5. Have you taken Nitroglycerin? No he does not have any?

## 2016-07-06 NOTE — Telephone Encounter (Signed)
Left message to call back  

## 2016-07-07 ENCOUNTER — Other Ambulatory Visit: Payer: Self-pay | Admitting: Cardiology

## 2016-07-07 DIAGNOSIS — I739 Peripheral vascular disease, unspecified: Secondary | ICD-10-CM

## 2016-07-08 MED ORDER — NITROGLYCERIN 0.4 MG SL SUBL: 0.4000 mg | SUBLINGUAL_TABLET | SUBLINGUAL | 11 refills | Status: DC | PRN

## 2016-07-08 NOTE — Telephone Encounter (Signed)
Spoke with pt, he reports a sharpe stabbing pain in his chest off and on through out the day. He reports NTG helps with the discomfort. He reports it is similar to the discomfort prior to his stent. He declined an appointment because he just saw dr harding. Advised to go to the ER if discomfort worsens. Refill sent to the pharmacy.

## 2016-07-12 ENCOUNTER — Ambulatory Visit (HOSPITAL_COMMUNITY)
Admission: RE | Admit: 2016-07-12 | Discharge: 2016-07-12 | Disposition: A | Discharge status: Home / Self Care | Payer: PPO | Source: Ambulatory Visit | Attending: Cardiology | Admitting: Cardiology

## 2016-07-12 ENCOUNTER — Telehealth: Payer: Self-pay | Admitting: *Deleted

## 2016-07-12 ENCOUNTER — Other Ambulatory Visit: Payer: Self-pay | Admitting: *Deleted

## 2016-07-12 DIAGNOSIS — E785 Hyperlipidemia, unspecified: Secondary | ICD-10-CM

## 2016-07-12 DIAGNOSIS — R600 Localized edema: Secondary | ICD-10-CM | POA: Insufficient documentation

## 2016-07-12 DIAGNOSIS — I739 Peripheral vascular disease, unspecified: Secondary | ICD-10-CM | POA: Diagnosis not present

## 2016-07-12 DIAGNOSIS — I2510 Atherosclerotic heart disease of native coronary artery without angina pectoris: Secondary | ICD-10-CM

## 2016-07-12 DIAGNOSIS — E669 Obesity, unspecified: Secondary | ICD-10-CM

## 2016-07-12 DIAGNOSIS — Z9861 Coronary angioplasty status: Principal | ICD-10-CM

## 2016-07-12 NOTE — Telephone Encounter (Signed)
Mail letter and labslip  

## 2016-07-12 NOTE — Telephone Encounter (Signed)
-----   Message from Raiford Simmonds, RN sent at 06/22/2016  3:07 PM EDT ----- LABS NEED 08/22/16 '@MAIL'$  LABSLIP  FOR CMP LIPID--07/22/16

## 2016-07-21 DIAGNOSIS — E789 Disorder of lipoprotein metabolism, unspecified: Secondary | ICD-10-CM | POA: Diagnosis not present

## 2016-07-21 DIAGNOSIS — I2510 Atherosclerotic heart disease of native coronary artery without angina pectoris: Secondary | ICD-10-CM | POA: Diagnosis not present

## 2016-07-21 DIAGNOSIS — M549 Dorsalgia, unspecified: Secondary | ICD-10-CM | POA: Diagnosis not present

## 2016-07-22 DIAGNOSIS — E785 Hyperlipidemia, unspecified: Secondary | ICD-10-CM | POA: Diagnosis not present

## 2016-07-22 DIAGNOSIS — E669 Obesity, unspecified: Secondary | ICD-10-CM | POA: Diagnosis not present

## 2016-07-22 LAB — COMPREHENSIVE METABOLIC PANEL
ALT: 13 U/L (ref 9–46)
AST: 12 U/L (ref 10–35)
Albumin: 4.1 g/dL (ref 3.6–5.1)
Alkaline Phosphatase: 73 U/L (ref 40–115)
BUN: 11 mg/dL (ref 7–25)
CHLORIDE: 103 mmol/L (ref 98–110)
CO2: 27 mmol/L (ref 20–31)
CREATININE: 1.09 mg/dL (ref 0.70–1.25)
Calcium: 9 mg/dL (ref 8.6–10.3)
GLUCOSE: 110 mg/dL — AB (ref 65–99)
POTASSIUM: 4.7 mmol/L (ref 3.5–5.3)
SODIUM: 142 mmol/L (ref 135–146)
TOTAL PROTEIN: 6.5 g/dL (ref 6.1–8.1)
Total Bilirubin: 0.5 mg/dL (ref 0.2–1.2)

## 2016-07-22 LAB — LIPID PANEL
Cholesterol: 103 mg/dL (ref <200)
HDL: 31 mg/dL — AB (ref >40)
LDL CALC: 55 mg/dL (ref <100)
TRIGLYCERIDES: 86 mg/dL (ref <150)
Total CHOL/HDL Ratio: 3.3 Ratio (ref <5.0)
VLDL: 17 mg/dL (ref <30)

## 2016-07-23 ENCOUNTER — Emergency Department (HOSPITAL_COMMUNITY): Payer: PPO

## 2016-07-23 ENCOUNTER — Inpatient Hospital Stay (HOSPITAL_COMMUNITY)
Admission: EM | Disposition: A | Discharge status: Home / Self Care | Payer: Self-pay | Source: Home / Self Care | Attending: Cardiovascular Disease

## 2016-07-23 ENCOUNTER — Inpatient Hospital Stay (HOSPITAL_COMMUNITY)
Admission: EM | Admit: 2016-07-23 | Discharge: 2016-07-25 | DRG: 246 | Disposition: A | Discharge status: Home / Self Care | Payer: PPO | Attending: Cardiovascular Disease | Admitting: Cardiovascular Disease

## 2016-07-23 ENCOUNTER — Encounter (HOSPITAL_COMMUNITY): Payer: Self-pay

## 2016-07-23 DIAGNOSIS — Z7951 Long term (current) use of inhaled steroids: Secondary | ICD-10-CM | POA: Diagnosis not present

## 2016-07-23 DIAGNOSIS — Z79899 Other long term (current) drug therapy: Secondary | ICD-10-CM | POA: Diagnosis not present

## 2016-07-23 DIAGNOSIS — I517 Cardiomegaly: Secondary | ICD-10-CM | POA: Diagnosis not present

## 2016-07-23 DIAGNOSIS — I472 Ventricular tachycardia: Secondary | ICD-10-CM | POA: Diagnosis not present

## 2016-07-23 DIAGNOSIS — E669 Obesity, unspecified: Secondary | ICD-10-CM | POA: Diagnosis not present

## 2016-07-23 DIAGNOSIS — F17210 Nicotine dependence, cigarettes, uncomplicated: Secondary | ICD-10-CM | POA: Diagnosis not present

## 2016-07-23 DIAGNOSIS — Z7982 Long term (current) use of aspirin: Secondary | ICD-10-CM

## 2016-07-23 DIAGNOSIS — G894 Chronic pain syndrome: Secondary | ICD-10-CM | POA: Diagnosis present

## 2016-07-23 DIAGNOSIS — Z6837 Body mass index (BMI) 37.0-37.9, adult: Secondary | ICD-10-CM | POA: Diagnosis not present

## 2016-07-23 DIAGNOSIS — I10 Essential (primary) hypertension: Secondary | ICD-10-CM | POA: Diagnosis present

## 2016-07-23 DIAGNOSIS — T82867A Thrombosis of cardiac prosthetic devices, implants and grafts, initial encounter: Principal | ICD-10-CM | POA: Diagnosis present

## 2016-07-23 DIAGNOSIS — I2119 ST elevation (STEMI) myocardial infarction involving other coronary artery of inferior wall: Secondary | ICD-10-CM | POA: Diagnosis not present

## 2016-07-23 DIAGNOSIS — E785 Hyperlipidemia, unspecified: Secondary | ICD-10-CM | POA: Diagnosis present

## 2016-07-23 DIAGNOSIS — R079 Chest pain, unspecified: Secondary | ICD-10-CM | POA: Diagnosis not present

## 2016-07-23 DIAGNOSIS — I119 Hypertensive heart disease without heart failure: Secondary | ICD-10-CM | POA: Diagnosis not present

## 2016-07-23 DIAGNOSIS — Z7902 Long term (current) use of antithrombotics/antiplatelets: Secondary | ICD-10-CM

## 2016-07-23 DIAGNOSIS — Z91041 Radiographic dye allergy status: Secondary | ICD-10-CM

## 2016-07-23 DIAGNOSIS — I2510 Atherosclerotic heart disease of native coronary artery without angina pectoris: Secondary | ICD-10-CM

## 2016-07-23 DIAGNOSIS — Z716 Tobacco abuse counseling: Secondary | ICD-10-CM

## 2016-07-23 DIAGNOSIS — Y831 Surgical operation with implant of artificial internal device as the cause of abnormal reaction of the patient, or of later complication, without mention of misadventure at the time of the procedure: Secondary | ICD-10-CM | POA: Diagnosis not present

## 2016-07-23 DIAGNOSIS — Z955 Presence of coronary angioplasty implant and graft: Secondary | ICD-10-CM

## 2016-07-23 DIAGNOSIS — I2111 ST elevation (STEMI) myocardial infarction involving right coronary artery: Secondary | ICD-10-CM | POA: Diagnosis not present

## 2016-07-23 HISTORY — PX: LEFT HEART CATH AND CORONARY ANGIOGRAPHY: CATH118249

## 2016-07-23 HISTORY — PX: CORONARY/GRAFT ACUTE MI REVASCULARIZATION: CATH118305

## 2016-07-23 LAB — CBC WITH DIFFERENTIAL/PLATELET
BASOS ABS: 0 10*3/uL (ref 0.0–0.1)
Basophils Relative: 0 %
Eosinophils Absolute: 0.3 10*3/uL (ref 0.0–0.7)
Eosinophils Relative: 2 %
HEMATOCRIT: 44.2 % (ref 39.0–52.0)
HEMOGLOBIN: 14.6 g/dL (ref 13.0–17.0)
LYMPHS ABS: 1.7 10*3/uL (ref 0.7–4.0)
LYMPHS PCT: 12 %
MCH: 31.7 pg (ref 26.0–34.0)
MCHC: 33 g/dL (ref 30.0–36.0)
MCV: 96.1 fL (ref 78.0–100.0)
Monocytes Absolute: 1.1 10*3/uL — ABNORMAL HIGH (ref 0.1–1.0)
Monocytes Relative: 7 %
NEUTROS ABS: 11.5 10*3/uL — AB (ref 1.7–7.7)
Neutrophils Relative %: 79 %
PLATELETS: 331 10*3/uL (ref 150–400)
RBC: 4.6 MIL/uL (ref 4.22–5.81)
RDW: 13.3 % (ref 11.5–15.5)
WBC: 14.6 10*3/uL — AB (ref 4.0–10.5)

## 2016-07-23 LAB — COMPREHENSIVE METABOLIC PANEL
ALK PHOS: 77 U/L (ref 38–126)
ALT: 18 U/L (ref 17–63)
AST: 19 U/L (ref 15–41)
Albumin: 4 g/dL (ref 3.5–5.0)
Anion gap: 10 (ref 5–15)
BILIRUBIN TOTAL: 0.6 mg/dL (ref 0.3–1.2)
BUN: 11 mg/dL (ref 6–20)
CALCIUM: 9.3 mg/dL (ref 8.9–10.3)
CO2: 27 mmol/L (ref 22–32)
CREATININE: 1.26 mg/dL — AB (ref 0.61–1.24)
Chloride: 103 mmol/L (ref 101–111)
GFR calc non Af Amer: >60 mL/min (ref >60)
Glucose, Bld: 148 mg/dL — ABNORMAL HIGH (ref 65–99)
Potassium: 3.2 mmol/L — ABNORMAL LOW (ref 3.5–5.1)
SODIUM: 140 mmol/L (ref 135–145)
TOTAL PROTEIN: 7.4 g/dL (ref 6.5–8.1)

## 2016-07-23 LAB — LIPID PANEL
CHOLESTEROL: 107 mg/dL (ref 0–200)
HDL: 31 mg/dL — ABNORMAL LOW (ref >40)
LDL CALC: 53 mg/dL (ref 0–99)
Total CHOL/HDL Ratio: 3.5 RATIO
Triglycerides: 116 mg/dL (ref <150)
VLDL: 23 mg/dL (ref 0–40)

## 2016-07-23 LAB — PROTIME-INR
INR: 1.04
Prothrombin Time: 13.7 seconds (ref 11.4–15.2)

## 2016-07-23 LAB — TROPONIN I: Troponin I: <0.03 ng/mL (ref <0.03)

## 2016-07-23 LAB — APTT: aPTT: 35 seconds (ref 24–36)

## 2016-07-23 IMAGING — CR DG CHEST 1V PORT
1 series · 1 of 1 positions shown · non-contrast
Comparison: [DATE]

CLINICAL DATA: Chest pain and diaphoretic.

EXAM:
PORTABLE CHEST 1 VIEW

[AP]
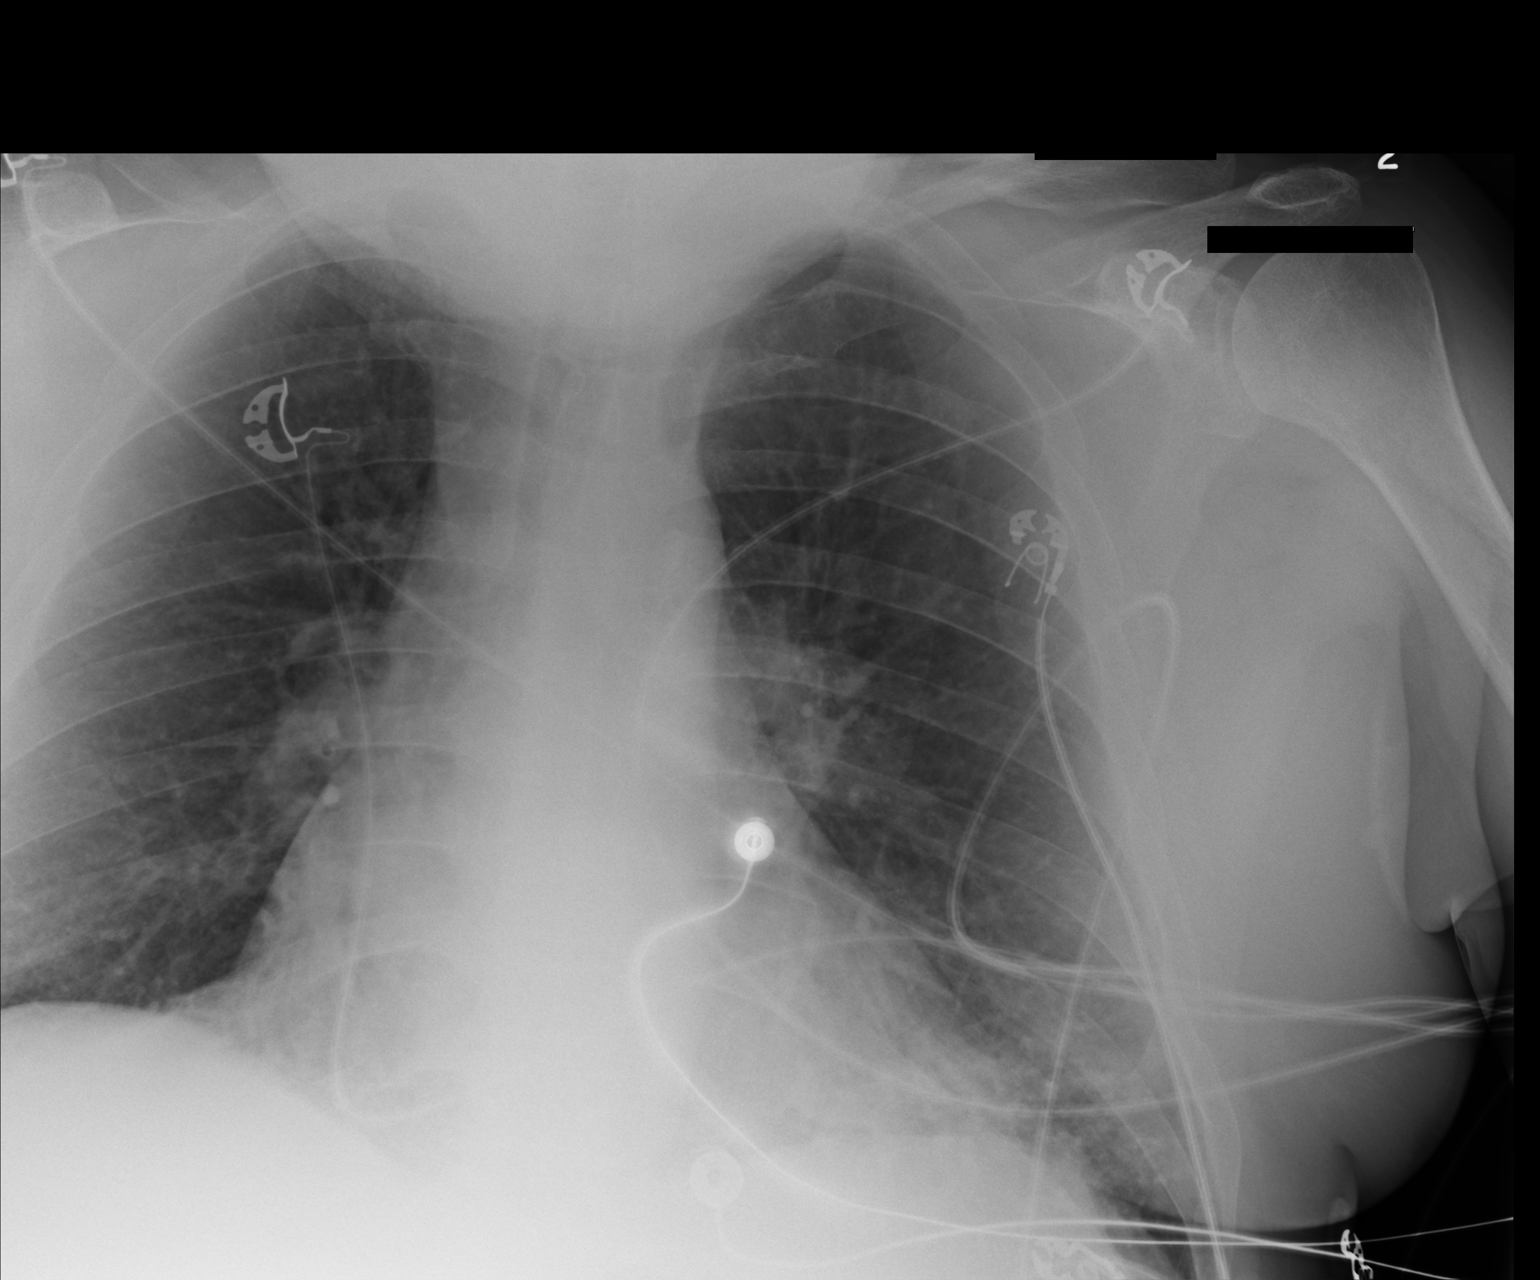

[1 of 1 positions shown; findings below may reference images not displayed]

FINDINGS: Lungs are adequately inflated as the right costophrenic angle is cut
off the field-of-view. Subtle hazy opacification over the left base
just above the hemidiaphragm which may be due to atelectasis or
infection. Mild stable cardiomegaly. Remainder of the exam is
unchanged.
IMPRESSION: Mild left base opacification which may be due to atelectasis or
infection.

Stable cardiomegaly.

## 2016-07-23 SURGERY — LEFT HEART CATH AND CORONARY ANGIOGRAPHY
Anesthesia: LOCAL

## 2016-07-23 MED ORDER — MIDAZOLAM HCL 2 MG/2ML IJ SOLN
INTRAMUSCULAR | Status: AC
  Filled 2016-07-23: qty 2

## 2016-07-23 MED ORDER — ACETAMINOPHEN 325 MG PO TABS
650.0000 mg | ORAL_TABLET | ORAL | Status: DC | PRN
  Administered 2016-07-24: 650 mg via ORAL
  Filled 2016-07-23: qty 2

## 2016-07-23 MED ORDER — ROSUVASTATIN CALCIUM 10 MG PO TABS
20.0000 mg | ORAL_TABLET | Freq: Every day | ORAL | Status: DC
  Administered 2016-07-24: 20 mg via ORAL
  Filled 2016-07-23: qty 2

## 2016-07-23 MED ORDER — METHYLPREDNISOLONE SODIUM SUCC 125 MG IJ SOLR
INTRAMUSCULAR | Status: DC | PRN
  Administered 2016-07-23: 125 mg via INTRAVENOUS

## 2016-07-23 MED ORDER — CALCIUM CARBONATE ANTACID 750 MG PO CHEW: 2.0000 | CHEWABLE_TABLET | Freq: Every day | ORAL | Status: DC | PRN

## 2016-07-23 MED ORDER — FENTANYL CITRATE (PF) 100 MCG/2ML IJ SOLN
INTRAMUSCULAR | Status: AC
  Filled 2016-07-23: qty 2

## 2016-07-23 MED ORDER — HYDROXYZINE PAMOATE 50 MG PO CAPS
50.0000 mg | ORAL_CAPSULE | Freq: Four times a day (QID) | ORAL | Status: DC | PRN
  Filled 2016-07-23: qty 1

## 2016-07-23 MED ORDER — HEPARIN (PORCINE) IN NACL 2-0.9 UNIT/ML-% IJ SOLN
INTRAMUSCULAR | Status: AC
  Filled 2016-07-23: qty 1000

## 2016-07-23 MED ORDER — TICAGRELOR 90 MG PO TABS
ORAL_TABLET | ORAL | Status: DC | PRN
  Administered 2016-07-23: 180 mg via ORAL

## 2016-07-23 MED ORDER — SODIUM CHLORIDE 0.9% FLUSH
3.0000 mL | Freq: Two times a day (BID) | INTRAVENOUS | Status: DC
  Administered 2016-07-24 (×2): 3 mL via INTRAVENOUS

## 2016-07-23 MED ORDER — FAMOTIDINE IN NACL 20-0.9 MG/50ML-% IV SOLN
INTRAVENOUS | Status: AC | PRN
  Administered 2016-07-23: 20 mg via INTRAVENOUS

## 2016-07-23 MED ORDER — HEPARIN SODIUM (PORCINE) 1000 UNIT/ML IJ SOLN
INTRAMUSCULAR | Status: AC
  Filled 2016-07-23: qty 1

## 2016-07-23 MED ORDER — VERAPAMIL HCL 2.5 MG/ML IV SOLN
INTRAVENOUS | Status: DC | PRN
  Administered 2016-07-23: 10 mL via INTRA_ARTERIAL

## 2016-07-23 MED ORDER — TIROFIBAN (AGGRASTAT) BOLUS VIA INFUSION
INTRAVENOUS | Status: DC | PRN
  Administered 2016-07-23: 2947.5 ug via INTRAVENOUS

## 2016-07-23 MED ORDER — HYDROXYZINE HCL 25 MG PO TABS
50.0000 mg | ORAL_TABLET | Freq: Four times a day (QID) | ORAL | Status: DC | PRN
  Administered 2016-07-24: 50 mg via ORAL
  Filled 2016-07-23: qty 2

## 2016-07-23 MED ORDER — HEPARIN SODIUM (PORCINE) 5000 UNIT/ML IJ SOLN
INTRAMUSCULAR | Status: AC
  Filled 2016-07-23: qty 1

## 2016-07-23 MED ORDER — MORPHINE SULFATE (PF) 4 MG/ML IV SOLN
2.0000 mg | Freq: Once | INTRAVENOUS | Status: AC
  Administered 2016-07-23: 2 mg via INTRAVENOUS

## 2016-07-23 MED ORDER — TIROFIBAN HCL IN NACL 5-0.9 MG/100ML-% IV SOLN
INTRAVENOUS | Status: AC | PRN
  Administered 2016-07-23: 0.15 ug/kg/min via INTRAVENOUS

## 2016-07-23 MED ORDER — ISOSORBIDE MONONITRATE ER 60 MG PO TB24
60.0000 mg | ORAL_TABLET | Freq: Two times a day (BID) | ORAL | Status: DC
  Filled 2016-07-23: qty 1

## 2016-07-23 MED ORDER — IOPAMIDOL (ISOVUE-370) INJECTION 76%
INTRAVENOUS | Status: DC | PRN
  Administered 2016-07-23: 120 mL via INTRA_ARTERIAL

## 2016-07-23 MED ORDER — HYDROMORPHONE HCL 1 MG/ML IJ SOLN
INTRAMUSCULAR | Status: DC | PRN
  Administered 2016-07-23: 1 mg via INTRAVENOUS

## 2016-07-23 MED ORDER — ATROPINE SULFATE 1 MG/10ML IJ SOSY
PREFILLED_SYRINGE | INTRAMUSCULAR | Status: AC
  Filled 2016-07-23: qty 10

## 2016-07-23 MED ORDER — NITROGLYCERIN 2 % TD OINT
1.0000 [in_us] | TOPICAL_OINTMENT | Freq: Once | TRANSDERMAL | Status: AC
  Administered 2016-07-23: 1 [in_us] via TOPICAL
  Filled 2016-07-23: qty 1

## 2016-07-23 MED ORDER — SACCHAROMYCES BOULARDII 250 MG PO CAPS
250.0000 mg | ORAL_CAPSULE | Freq: Two times a day (BID) | ORAL | Status: DC
  Administered 2016-07-24 – 2016-07-25 (×3): 250 mg via ORAL
  Filled 2016-07-23 (×3): qty 1

## 2016-07-23 MED ORDER — HYDRALAZINE HCL 20 MG/ML IJ SOLN: 5.0000 mg | INTRAMUSCULAR | Status: AC | PRN

## 2016-07-23 MED ORDER — FENTANYL CITRATE (PF) 100 MCG/2ML IJ SOLN
INTRAMUSCULAR | Status: DC | PRN
  Administered 2016-07-23: 25 ug via INTRAVENOUS
  Administered 2016-07-23: 50 ug via INTRAVENOUS

## 2016-07-23 MED ORDER — MORPHINE SULFATE 15 MG PO TABS
30.0000 mg | ORAL_TABLET | Freq: Every day | ORAL | Status: DC
  Administered 2016-07-23 – 2016-07-25 (×8): 30 mg via ORAL
  Filled 2016-07-23 (×8): qty 2

## 2016-07-23 MED ORDER — SODIUM CHLORIDE 0.9 % IV SOLN
INTRAVENOUS | Status: AC
  Administered 2016-07-23 – 2016-07-24 (×2): via INTRAVENOUS

## 2016-07-23 MED ORDER — HEPARIN SODIUM (PORCINE) 5000 UNIT/ML IJ SOLN
60.0000 [IU]/kg | Freq: Once | INTRAMUSCULAR | Status: AC
  Administered 2016-07-23: 4000 [IU] via INTRAVENOUS

## 2016-07-23 MED ORDER — SODIUM CHLORIDE 0.9% FLUSH: 3.0000 mL | INTRAVENOUS | Status: DC | PRN

## 2016-07-23 MED ORDER — ONDANSETRON HCL 4 MG/2ML IJ SOLN: 4.0000 mg | Freq: Four times a day (QID) | INTRAMUSCULAR | Status: DC | PRN

## 2016-07-23 MED ORDER — NITROGLYCERIN 1 MG/10 ML FOR IR/CATH LAB
INTRA_ARTERIAL | Status: AC
  Filled 2016-07-23: qty 10

## 2016-07-23 MED ORDER — NITROGLYCERIN 0.4 MG SL SUBL
SUBLINGUAL_TABLET | SUBLINGUAL | Status: AC
  Administered 2016-07-23: 0.4 mg
  Filled 2016-07-23: qty 3

## 2016-07-23 MED ORDER — HEPARIN (PORCINE) IN NACL 2-0.9 UNIT/ML-% IJ SOLN
INTRAMUSCULAR | Status: AC | PRN
  Administered 2016-07-23: 1000 mL

## 2016-07-23 MED ORDER — MORPHINE SULFATE (PF) 4 MG/ML IV SOLN
INTRAVENOUS | Status: AC
  Filled 2016-07-23: qty 1

## 2016-07-23 MED ORDER — MIDAZOLAM HCL 2 MG/2ML IJ SOLN
INTRAMUSCULAR | Status: DC | PRN
  Administered 2016-07-23: 2 mg via INTRAVENOUS

## 2016-07-23 MED ORDER — TICAGRELOR 90 MG PO TABS
ORAL_TABLET | ORAL | Status: AC
  Filled 2016-07-23: qty 2

## 2016-07-23 MED ORDER — METOPROLOL TARTRATE 25 MG PO TABS
25.0000 mg | ORAL_TABLET | Freq: Two times a day (BID) | ORAL | Status: DC
  Administered 2016-07-24 – 2016-07-25 (×4): 25 mg via ORAL
  Filled 2016-07-23 (×4): qty 1

## 2016-07-23 MED ORDER — SODIUM CHLORIDE 0.9 % IV BOLUS (SEPSIS)
1000.0000 mL | Freq: Once | INTRAVENOUS | Status: AC
  Administered 2016-07-23: 1000 mL via INTRAVENOUS

## 2016-07-23 MED ORDER — LIDOCAINE HCL (PF) 1 % IJ SOLN
INTRAMUSCULAR | Status: AC
  Filled 2016-07-23: qty 30

## 2016-07-23 MED ORDER — METHYLPREDNISOLONE SODIUM SUCC 125 MG IJ SOLR
INTRAMUSCULAR | Status: AC
  Filled 2016-07-23: qty 2

## 2016-07-23 MED ORDER — FENTANYL CITRATE (PF) 100 MCG/2ML IJ SOLN
INTRAMUSCULAR | Status: AC
  Administered 2016-07-23: 100 ug
  Filled 2016-07-23: qty 2

## 2016-07-23 MED ORDER — HYDROMORPHONE HCL 1 MG/ML IJ SOLN
INTRAMUSCULAR | Status: AC
  Filled 2016-07-23: qty 0.5

## 2016-07-23 MED ORDER — DIPHENHYDRAMINE HCL 50 MG/ML IJ SOLN
INTRAMUSCULAR | Status: AC
  Filled 2016-07-23: qty 1

## 2016-07-23 MED ORDER — DIPHENHYDRAMINE HCL 50 MG/ML IJ SOLN
INTRAMUSCULAR | Status: DC | PRN
  Administered 2016-07-23: 25 mg via INTRAVENOUS

## 2016-07-23 MED ORDER — ENOXAPARIN SODIUM 40 MG/0.4ML ~~LOC~~ SOLN
40.0000 mg | SUBCUTANEOUS | Status: DC
  Administered 2016-07-24: 40 mg via SUBCUTANEOUS
  Filled 2016-07-23: qty 0.4

## 2016-07-23 MED ORDER — CALCIUM CARBONATE ANTACID 500 MG PO CHEW
800.0000 mg | CHEWABLE_TABLET | Freq: Every day | ORAL | Status: DC | PRN
  Administered 2016-07-24 – 2016-07-25 (×2): 800 mg via ORAL
  Filled 2016-07-23 (×2): qty 4

## 2016-07-23 MED ORDER — NITROGLYCERIN 0.4 MG SL SUBL
0.4000 mg | SUBLINGUAL_TABLET | SUBLINGUAL | Status: DC | PRN
  Administered 2016-07-24 (×3): 0.4 mg via SUBLINGUAL
  Filled 2016-07-23: qty 1

## 2016-07-23 MED ORDER — ATROPINE SULFATE 1 MG/10ML IJ SOSY
PREFILLED_SYRINGE | INTRAMUSCULAR | Status: DC | PRN
  Administered 2016-07-23: 1 mg via INTRAVENOUS

## 2016-07-23 MED ORDER — ASPIRIN EC 81 MG PO TBEC
81.0000 mg | DELAYED_RELEASE_TABLET | Freq: Every day | ORAL | Status: DC
  Administered 2016-07-24 – 2016-07-25 (×2): 81 mg via ORAL
  Filled 2016-07-23 (×2): qty 1

## 2016-07-23 MED ORDER — ALPRAZOLAM 0.5 MG PO TABS
0.5000 mg | ORAL_TABLET | Freq: Three times a day (TID) | ORAL | Status: DC
  Administered 2016-07-23 – 2016-07-25 (×5): 0.5 mg via ORAL
  Filled 2016-07-23 (×5): qty 1

## 2016-07-23 MED ORDER — LISINOPRIL 10 MG PO TABS: 10.0000 mg | ORAL_TABLET | Freq: Every day | ORAL | Status: DC

## 2016-07-23 MED ORDER — TIROFIBAN HCL IV 12.5 MG/250 ML
0.1500 ug/kg/min | INTRAVENOUS | Status: AC
  Administered 2016-07-23: 0.15 ug/kg/min via INTRAVENOUS
  Filled 2016-07-23 (×2): qty 250

## 2016-07-23 MED ORDER — HEPARIN SODIUM (PORCINE) 1000 UNIT/ML IJ SOLN
INTRAMUSCULAR | Status: DC | PRN
  Administered 2016-07-23: 8000 [IU] via INTRAVENOUS

## 2016-07-23 MED ORDER — SODIUM CHLORIDE 0.9 % IV SOLN: 250.0000 mL | INTRAVENOUS | Status: DC | PRN

## 2016-07-23 MED ORDER — TICAGRELOR 90 MG PO TABS
90.0000 mg | ORAL_TABLET | Freq: Two times a day (BID) | ORAL | Status: DC
  Administered 2016-07-24 – 2016-07-25 (×3): 90 mg via ORAL
  Filled 2016-07-23 (×3): qty 1

## 2016-07-23 MED ORDER — ASPIRIN 81 MG PO CHEW
324.0000 mg | CHEWABLE_TABLET | Freq: Once | ORAL | Status: AC
  Administered 2016-07-23: 324 mg via ORAL
  Filled 2016-07-23: qty 4

## 2016-07-23 MED ORDER — LABETALOL HCL 5 MG/ML IV SOLN: 10.0000 mg | INTRAVENOUS | Status: AC | PRN

## 2016-07-23 MED ORDER — LIDOCAINE HCL (PF) 1 % IJ SOLN
INTRAMUSCULAR | Status: DC | PRN
  Administered 2016-07-23: 2 mL via INTRADERMAL

## 2016-07-23 SURGICAL SUPPLY — 19 items
BALLN EUPHORA RX 2.5X12 (BALLOONS) ×2
BALLN ~~LOC~~ EMERGE MR 3.75X15 (BALLOONS) ×2
BALLOON EUPHORA RX 2.5X12 (BALLOONS) IMPLANT
BALLOON ~~LOC~~ EMERGE MR 3.75X15 (BALLOONS) IMPLANT
CATH 5FR JL3.5 JR4 ANG PIG MP (CATHETERS) ×1 IMPLANT
CATH EXTRAC PRONTO 5.5F 138CM (CATHETERS) ×1 IMPLANT
DEVICE RAD COMP TR BAND LRG (VASCULAR PRODUCTS) ×1 IMPLANT
GLIDESHEATH SLEND SS 6F .021 (SHEATH) ×1 IMPLANT
GUIDE CATH RUNWAY 6FR FR4 (CATHETERS) ×1 IMPLANT
GUIDEWIRE INQWIRE 1.5J.035X260 (WIRE) IMPLANT
INQWIRE 1.5J .035X260CM (WIRE) ×2
KIT ENCORE 26 ADVANTAGE (KITS) ×1 IMPLANT
KIT HEART LEFT (KITS) ×2 IMPLANT
PACK CARDIAC CATHETERIZATION (CUSTOM PROCEDURE TRAY) ×2 IMPLANT
STENT PROMUS PREM MR 3.5X38 (Permanent Stent) ×1 IMPLANT
SYR MEDRAD MARK V 150ML (SYRINGE) ×2 IMPLANT
TRANSDUCER W/STOPCOCK (MISCELLANEOUS) ×2 IMPLANT
TUBING CIL FLEX 10 FLL-RA (TUBING) ×2 IMPLANT
WIRE COUGAR XT STRL 190CM (WIRE) ×1 IMPLANT

## 2016-07-23 NOTE — Progress Notes (Signed)
PHARMACY CONSULT NOTE - INITIAL   Pharmacy Consult for tirofiban Indication: s/p PCI  Allergies  Allergen Reactions  . Iohexol      Code: HIVES, Desc: PT STATES HE HAD AN IVP 15 YRS AGO AND HAD SOB AND BROKE OUT IN HIVES., Onset Date: [id number]   . Ivp Dye [Iodinated Diagnostic Agents] Other (See Comments)    intolerance    Patient Measurements: Weight: 260 lb (117.9 kg)  Vital Signs: Temp: 98.6 F (37 C) (05/19 2106) Temp Source: Oral (05/19 2106) BP: 120/76 (05/19 2238) Pulse Rate: 0 (05/19 2243)  Labs:  Recent Labs  07/22/16 1111 07/23/16 2111  WBC  --  14.6*  HGB  --  14.6  HCT  --  44.2  PLT  --  331  APTT  --  35  CREATININE 1.09 1.26*  ALBUMIN 4.1 4.0  PROT 6.5 7.4  AST 12 19  ALT 13 18  ALKPHOS 73 77  BILITOT 0.5 0.6   Estimated Creatinine Clearance: 77.8 mL/min (A) (by C-G formula based on SCr of 1.26 mg/dL (H)).  Medical History: Past Medical History:  Diagnosis Date  . Adenomatous colon polyp   . Anxiety   . Anxiety disorder   . Arthritis    "everywhere"   . CAD S/P percutaneous coronary angioplasty 2006   January 06: Taxus DES to RCA; March '06 Cypher DES to LAD; EF 66%  . Chronic pain syndrome    , as noted by handwritten note from primary physician   . COPD (chronic obstructive pulmonary disease) ([place])   . Depression   . Dyslipidemia, goal LDL below 70   . Fibromyalgia   . FRACTURE, RIB, LEFT 11/15/2006   Qualifier: Diagnosis of  By: Drinkard MSN, FNP-C, Collie Siad    . GERD (gastroesophageal reflux disease)    on occas. uses TUMS for heartburn   . Headache    pt. remarks that he gets sinus headaches   . History of migraine headaches   . Hypertension, essential, benign   . Neuromuscular disorder (HCC)    L leg, nerve damage   . Obesity, Class II, BMI 35-39.9   . Pneumonia 2015   hosp. after RIB fx   . Tobacco abuse     Medications:  Prescriptions Prior to Admission  Medication Sig Dispense Refill Last Dose  . ALPRAZolam (XANAX) 0.5  MG tablet Take 0.5 mg by mouth 3 (three) times daily.   Taking  . aspirin EC 81 MG tablet Take 1 tablet (81 mg total) by mouth daily. 90 tablet 3 Taking  . betamethasone valerate (VALISONE) 0.1 % cream Apply 1 application topically as needed.    Taking  . calcium carbonate (TUMS EX) 750 MG chewable tablet Chew 2 tablets by mouth daily as needed for heartburn.   Taking  . Cholecalciferol (VITAMIN D-3) 1000 UNITS CAPS Take 1,000 Units by mouth daily.    Taking  . clopidogrel (PLAVIX) 75 MG tablet Take 1 tablet (75 mg total) by mouth daily. 90 tablet 3   . diltiazem (CARDIZEM CD) 120 MG 24 hr capsule Take 1 capsule (120 mg total) by mouth daily. 90 capsule 3   . fluticasone (FLONASE) 50 MCG/ACT nasal spray Place 1 spray into both nostrils daily as needed for allergies.    Taking  . Fluticasone-Salmeterol (ADVAIR) 100-50 MCG/DOSE AEPB Inhale 1 puff into the lungs 2 (two) times daily as needed (pt. reports that he only uses PRN).    Taking  . furosemide (LASIX) 40 MG  tablet Take 1 tablet (40 mg total) by mouth as needed. 45 tablet 3   . hydrOXYzine (VISTARIL) 50 MG capsule Take 50 mg by mouth 4 (four) times daily as needed for itching.   Taking  . isosorbide mononitrate (IMDUR) 30 MG 24 hr tablet Take 2 tablets (60 mg total) by mouth 2 (two) times daily. 180 tablet 3   . lisinopril (PRINIVIL,ZESTRIL) 10 MG tablet Take 1 tablet (10 mg total) by mouth daily. 90 tablet 3   . morphine (MSIR) 30 MG tablet Take 30 mg by mouth 5 (five) times daily.    Taking  . nitroGLYCERIN (NITROSTAT) 0.4 MG SL tablet Place 1 tablet (0.4 mg total) under the tongue every 5 (five) minutes as needed for chest pain. 25 tablet 11   . potassium chloride SA (K-DUR,KLOR-CON) 20 MEQ tablet Take a tablet if you use lf furosemide as needed daily 45 tablet 3   . rosuvastatin (CRESTOR) 20 MG tablet Take 1 tablet (20 mg total) by mouth daily. 90 tablet 3   . saccharomyces boulardii (FLORASTOR) 250 MG capsule Take 1 capsule (250 mg total)  by mouth 2 (two) times daily. 40 capsule 1 Taking  . silver sulfADIAZINE (SILVADENE) 1 % cream Apply 1 application topically 2 (two) times daily. Apply to abdomen twice daily with dressing changes 50 g 0 Taking   Scheduled:  . ALPRAZolam  0.5 mg Oral TID  . [START ON 07/24/2016] aspirin EC  81 mg Oral Daily  . [START ON 07/24/2016] enoxaparin (LOVENOX) injection  40 mg Subcutaneous Q24H  . isosorbide mononitrate  60 mg Oral BID  . [START ON 07/24/2016] lisinopril  10 mg Oral Daily  . metoprolol tartrate  25 mg Oral BID  . morphine  30 mg Oral 5 X Daily  . morphine      . [START ON 07/24/2016] rosuvastatin  20 mg Oral q1800  . saccharomyces boulardii  250 mg Oral BID  . [START ON 07/24/2016] sodium chloride flush  3 mL Intravenous Q12H  . [START ON 07/24/2016] ticagrelor  90 mg Oral BID   Infusions:  . sodium chloride    . [START ON 07/24/2016] sodium chloride    . tirofiban      Assessment: 61yo male presents w/ acute MI 2/2 very late stent thrombosis, now s/p successful PCI, to continue tirofiban x12h.  Plan:  Will continue tirofiban at 0.15 mcg/kg/min x12h and check CBC w/ am labs.  Wynona Neat, PharmD, BCPS  07/23/2016,11:07 PM

## 2016-07-23 NOTE — ED Provider Notes (Signed)
I saw and evaluated the patient, reviewed the resident's note and I agree with the findings and plan with the following exceptions.   61 yo M w/ h/o CAD here with one hour of acute onset of central chest pressure with radiation to bilateral arms.  On eval, gray, diaphoretic, cool and clammy. ECG with obvious inferior stemi and reciprocal changes. Code STEMI activated. ASA/heparin given. Cardiology to take to cath lab.   CRITICAL CARE Performed by: Merrily Pew Total critical care time: 35 minutes Critical care time was exclusive of separately billable procedures and treating other patients. Critical care was necessary to treat or prevent imminent or life-threatening deterioration. Critical care was time spent personally by me on the following activities: development of treatment plan with patient and/or surrogate as well as nursing, discussions with consultants, evaluation of patient's response to treatment, examination of patient, obtaining history from patient or surrogate, ordering and performing treatments and interventions, ordering and review of laboratory studies, ordering and review of radiographic studies, pulse oximetry and re-evaluation of patient's condition.    EKG Interpretation  Date/Time:  Saturday Jul 23 2016 21:06:14 EDT Ventricular Rate:  75 PR Interval:  182 QRS Duration: 88 QT Interval:  388 QTC Calculation: 433 R Axis:   74 Text Interpretation:  Critical Test Result: STEMI Normal sinus rhythm with sinus arrhythmia Low voltage QRS ST elevation consider inferior injury or acute infarct ** ** ACUTE MI / STEMI ** ** Consider right ventricular involvement in acute inferior infarct Abnormal ECG Confirmed by Merrily Pew [phone]) on 07/23/2016 9:09:24 PM         Sudeep Scheibel, Corene Cornea, MD 07/24/16 1747
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
## 2016-07-23 NOTE — ED Triage Notes (Signed)
Here for cp, pt diaphoretic and pale in triage reports sob also. ekg reads stemi

## 2016-07-23 NOTE — ED Notes (Signed)
2nd nitro tab given SL

## 2016-07-23 NOTE — ED Notes (Signed)
Sister [name] took wallet, keys, cellphone, clothing, and pocket change

## 2016-07-23 NOTE — ED Notes (Signed)
Pt to cath lab at this time

## 2016-07-23 NOTE — ED Notes (Signed)
bilat groin shaved

## 2016-07-23 NOTE — ED Notes (Signed)
Radiolucent pads placed at this time

## 2016-07-23 NOTE — ED Notes (Signed)
Pt ready for cath lab at this time

## 2016-07-24 LAB — BASIC METABOLIC PANEL
ANION GAP: 10 (ref 5–15)
BUN: 13 mg/dL (ref 6–20)
CALCIUM: 9.1 mg/dL (ref 8.9–10.3)
CHLORIDE: 104 mmol/L (ref 101–111)
CO2: 22 mmol/L (ref 22–32)
CREATININE: 1.11 mg/dL (ref 0.61–1.24)
Glucose, Bld: 175 mg/dL — ABNORMAL HIGH (ref 65–99)
POTASSIUM: 5.1 mmol/L (ref 3.5–5.1)
SODIUM: 136 mmol/L (ref 135–145)

## 2016-07-24 LAB — CBC
HCT: 40.9 % (ref 39.0–52.0)
HCT: 42.5 % (ref 39.0–52.0)
HEMOGLOBIN: 13.7 g/dL (ref 13.0–17.0)
Hemoglobin: 13.3 g/dL (ref 13.0–17.0)
MCH: 31.3 pg (ref 26.0–34.0)
MCH: 31.4 pg (ref 26.0–34.0)
MCHC: 32.2 g/dL (ref 30.0–36.0)
MCHC: 32.5 g/dL (ref 30.0–36.0)
MCV: 96.7 fL (ref 78.0–100.0)
MCV: 97 fL (ref 78.0–100.0)
Platelets: 288 10*3/uL (ref 150–400)
Platelets: 324 10*3/uL (ref 150–400)
RBC: 4.23 MIL/uL (ref 4.22–5.81)
RBC: 4.38 MIL/uL (ref 4.22–5.81)
RDW: 13.3 % (ref 11.5–15.5)
RDW: 13.5 % (ref 11.5–15.5)
WBC: 9.6 10*3/uL (ref 4.0–10.5)
WBC: 14.9 10*3/uL — AB (ref 4.0–10.5)

## 2016-07-24 LAB — LIPID PANEL
CHOLESTEROL: 94 mg/dL (ref 0–200)
HDL: 30 mg/dL — ABNORMAL LOW (ref >40)
LDL Cholesterol: 51 mg/dL (ref 0–99)
Total CHOL/HDL Ratio: 3.1 RATIO
Triglycerides: 66 mg/dL (ref <150)
VLDL: 13 mg/dL (ref 0–40)

## 2016-07-24 LAB — CREATININE, SERUM
Creatinine, Ser: 1.12 mg/dL (ref 0.61–1.24)
GFR calc non Af Amer: >60 mL/min (ref >60)

## 2016-07-24 LAB — TROPONIN I
TROPONIN I: 20.26 ng/mL — AB (ref <0.03)
TROPONIN I: 20.39 ng/mL — AB (ref <0.03)
TROPONIN I: 23.44 ng/mL — AB (ref <0.03)

## 2016-07-24 LAB — MRSA PCR SCREENING: MRSA by PCR: NEGATIVE

## 2016-07-24 LAB — HIV ANTIBODY (ROUTINE TESTING W REFLEX): HIV Screen 4th Generation wRfx: NONREACTIVE

## 2016-07-24 LAB — BRAIN NATRIURETIC PEPTIDE: B Natriuretic Peptide: 58.7 pg/mL (ref 0.0–100.0)

## 2016-07-24 MED ORDER — POTASSIUM CHLORIDE CRYS ER 20 MEQ PO TBCR
60.0000 meq | EXTENDED_RELEASE_TABLET | Freq: Once | ORAL | Status: AC
  Administered 2016-07-24: 60 meq via ORAL
  Filled 2016-07-24: qty 3

## 2016-07-24 MED ORDER — ORAL CARE MOUTH RINSE
15.0000 mL | Freq: Two times a day (BID) | OROMUCOSAL | Status: DC
  Administered 2016-07-24 (×2): 15 mL via OROMUCOSAL

## 2016-07-24 NOTE — Progress Notes (Signed)
CRITICAL VALUE ALERT  Critical value received:  2354   Date of notification:  07/23/16  Time of notification:  2354  Critical value read back:Yes.    Nurse who received alert:  Laverda Sorenson  MD notified (1st page):  corotto Md   Time of first page:  2355  MD notified (2nd page):  Time of second page:  Responding MD:  [id number]  Time MD responded:  <5 min

## 2016-07-24 NOTE — Progress Notes (Signed)
Progress Note  Patient Name: [name] [name] Date of Encounter: 07/24/2016  Primary Cardiologist: Dr Ellyn Hack  Subjective   No chest pain or dyspnea this AM; received NTG last PM  Inpatient Medications    Scheduled Meds: . ALPRAZolam  0.5 mg Oral TID  . aspirin EC  81 mg Oral Daily  . enoxaparin (LOVENOX) injection  40 mg Subcutaneous Q24H  . isosorbide mononitrate  60 mg Oral BID  . lisinopril  10 mg Oral Daily  . metoprolol tartrate  25 mg Oral BID  . morphine  30 mg Oral 5 X Daily  . morphine      . rosuvastatin  20 mg Oral q1800  . saccharomyces boulardii  250 mg Oral BID  . sodium chloride flush  3 mL Intravenous Q12H  . ticagrelor  90 mg Oral BID   Continuous Infusions: . sodium chloride 75 mL/hr at 07/24/16 0700  . sodium chloride    . tirofiban 0.15 mcg/kg/min (07/24/16 0700)   PRN Meds: sodium chloride, acetaminophen, calcium carbonate, hydrOXYzine, nitroGLYCERIN, ondansetron (ZOFRAN) IV, sodium chloride flush   Vital Signs    Vitals:   07/24/16 0534 07/24/16 0600 07/24/16 0630 07/24/16 0700  BP:  111/64 107/64 104/67  Pulse:  65 66 70  Resp:  '15 16 16  '$ Temp:      TempSrc:      SpO2:  93% 92% 94%  Weight: 115.6 kg (254 lb 13.6 oz)       Intake/Output Summary (Last 24 hours) at 07/24/16 0820 Last data filed at 07/24/16 0700  Gross per 24 hour  Intake           815.68 ml  Output              350 ml  Net           465.68 ml   Filed Weights   07/23/16 2124 07/24/16 0534  Weight: 117.9 kg (260 lb) 115.6 kg (254 lb 13.6 oz)    Telemetry    Sinus with NSVT- Personally Reviewed   Physical Exam   GEN: No acute distress, obese.   Neck: No JVD Cardiac: RRR, no murmurs.  Respiratory: Diminished BS; mild wheeze GI: Soft, nontender, non-distended  MS: No edema; Radial cath site with no hematoma. Neuro:  Nonfocal  Psych: Normal affect   Labs    Chemistry Recent Labs Lab 07/22/16 1111 07/23/16 2111 07/23/16 2354 07/24/16 0617  NA 142 140   --  136  K 4.7 3.2*  --  5.1  CL 103 103  --  104  CO2 27 27  --  22  GLUCOSE 110* 148*  --  175*  BUN 11 11  --  13  CREATININE 1.09 1.26* 1.12 1.11  CALCIUM 9.0 9.3  --  9.1  PROT 6.5 7.4  --   --   ALBUMIN 4.1 4.0  --   --   AST 12 19  --   --   ALT 13 18  --   --   ALKPHOS 73 77  --   --   BILITOT 0.5 0.6  --   --   GFRNONAA  --  >60 >60 >60  GFRAA  --  >60 >60 >60  ANIONGAP  --  10  --  10     Hematology Recent Labs Lab 07/23/16 2111 07/23/16 2354 07/24/16 0617  WBC 14.6* 14.9* 9.6  RBC 4.60 4.38 4.23  HGB 14.6 13.7 13.3  HCT 44.2 42.5  40.9  MCV 96.1 97.0 96.7  MCH 31.7 31.3 31.4  MCHC 33.0 32.2 32.5  RDW 13.3 13.3 13.5  PLT 331 324 288    Cardiac Enzymes Recent Labs Lab 07/23/16 2111 07/23/16 2354 07/24/16 0617  TROPONINI <0.03 20.39* 23.44*     BNP Recent Labs Lab 07/23/16 2354  BNP 58.7      Radiology    Dg Chest Portable 1 View  Result Date: 07/23/2016 CLINICAL DATA:  Chest pain and diaphoretic. EXAM: PORTABLE CHEST 1 VIEW COMPARISON:  07/20/2015 FINDINGS: Lungs are adequately inflated as the right costophrenic angle is cut off the field-of-view. Subtle hazy opacification over the left base just above the hemidiaphragm which may be due to atelectasis or infection. Mild stable cardiomegaly. Remainder of the exam is unchanged. IMPRESSION: Mild left base opacification which may be due to atelectasis or infection. Stable cardiomegaly. Electronically Signed   By: Marin Olp M.D.   On: 07/23/2016 21:34    Patient Profile     61 y.o. male with past medical history of coronary artery disease admitted with acute inferior myocardial infarction. Cardiac catheterization revealed thrombosis of stent in RCA, widely patent LAD stent and no obstructive disease in left main or circumflex. Ejection fraction is 55%. Patient had aspiration thrombectomy and drug-eluting stent to RCA.  Assessment & Plan    1 acute inferior myocardial infarction-patient is not  having recurrent chest pain this morning although apparently received nitroglycerin last evening. We will continue with medical therapy. Continue aspirin, brilinta, metoprolol and statin. Transfer to telemetry. Possible discharge tomorrow if stable.  2 nonsustained ventricular tachycardia-noted on telemetry. Occurring post MI. LV function is preserved. Continue beta blocker. Follow telemetry.  3 hyperlipidemia-continue statin.   4 Hypertension-blood pressure is borderline. Hold Imdur and lisinopril this morning. Resume at discharge pending follow-up blood pressure.   5 tobacco abuse-patient counseled on discontinuing.   6 Chronic pain syndrome  Signed, Kirk Ruths, MD  07/24/2016, 8:20 AM

## 2016-07-24 NOTE — Progress Notes (Signed)
Report called to 3W and questions answered. Sister [name] at bedside, aware of plan to move.

## 2016-07-25 ENCOUNTER — Encounter (HOSPITAL_COMMUNITY): Payer: Self-pay | Admitting: Cardiovascular Disease

## 2016-07-25 LAB — BASIC METABOLIC PANEL
Anion gap: 8 (ref 5–15)
BUN: 17 mg/dL (ref 6–20)
CALCIUM: 9 mg/dL (ref 8.9–10.3)
CO2: 26 mmol/L (ref 22–32)
Chloride: 103 mmol/L (ref 101–111)
Creatinine, Ser: 1 mg/dL (ref 0.61–1.24)
GFR calc Af Amer: >60 mL/min (ref >60)
GLUCOSE: 135 mg/dL — AB (ref 65–99)
Potassium: 4.5 mmol/L (ref 3.5–5.1)
Sodium: 137 mmol/L (ref 135–145)

## 2016-07-25 LAB — CBC
HCT: 40 % (ref 39.0–52.0)
Hemoglobin: 13 g/dL (ref 13.0–17.0)
MCH: 31.4 pg (ref 26.0–34.0)
MCHC: 32.5 g/dL (ref 30.0–36.0)
MCV: 96.6 fL (ref 78.0–100.0)
Platelets: 276 10*3/uL (ref 150–400)
RBC: 4.14 MIL/uL — ABNORMAL LOW (ref 4.22–5.81)
RDW: 13.4 % (ref 11.5–15.5)
WBC: 14 10*3/uL — ABNORMAL HIGH (ref 4.0–10.5)

## 2016-07-25 LAB — POCT ACTIVATED CLOTTING TIME: ACTIVATED CLOTTING TIME: 279 s

## 2016-07-25 LAB — HEMOGLOBIN A1C
HEMOGLOBIN A1C: 5.7 % — AB (ref 4.8–5.6)
Mean Plasma Glucose: 117 mg/dL

## 2016-07-25 MED ORDER — METOPROLOL TARTRATE 25 MG PO TABS: 25.0000 mg | ORAL_TABLET | Freq: Two times a day (BID) | ORAL | 3 refills | Status: DC

## 2016-07-25 MED ORDER — TICAGRELOR 90 MG PO TABS: 90.0000 mg | ORAL_TABLET | Freq: Two times a day (BID) | ORAL | 10 refills | Status: DC

## 2016-07-25 MED ORDER — TICAGRELOR 90 MG PO TABS: 90.0000 mg | ORAL_TABLET | Freq: Two times a day (BID) | ORAL | 0 refills | Status: DC

## 2016-07-25 MED FILL — Nitroglycerin IV Soln 100 MCG/ML in D5W: INTRA_ARTERIAL | Qty: 10 | Status: AC

## 2016-07-25 NOTE — Plan of Care (Signed)
Problem: Education: Goal: Knowledge of [facility] Education information/materials will improve Outcome: Progressing Patient aware of plan of care.  RN provided medication education on medications administered prior to administration.

## 2016-07-25 NOTE — Care Management Note (Addendum)
Case Management Note  Patient Details  Name: [name] [name] MRN: [id number] Date of Birth: 1955/03/19  Subjective/Objective:  Pt presented for [facility]- plan for home on Brilinta. Pt is from home with support of sister. Plan for d/c home today.                 Action/Plan: CM did provide pt with 30 day free Brilinta Card. Pt utilizes [facility] on [address]- Medication is available in a partial and the rest can be ordered. Benefits check completed Co-pay for the drug Brilinta 90 mg is $8.25. No Pre-auth. required. Pt is aware of cost. No further needs from CM at this time.   Expected Discharge Date:  07/25/16               Expected Discharge Plan:  Home/Self Care  In-House Referral:  NA  Discharge planning Services  CM Consult  Post Acute Care Choice:  NA Choice offered to:  NA  DME Arranged:  N/A DME Agency:  NA  HH Arranged:  NA HH Agency:  NA  Status of Service:  Completed, signed off  If discussed at [place] of Stay Meetings, dates discussed:    Additional Comments:  Bethena Roys, RN 07/25/2016, 10:53 AM

## 2016-07-25 NOTE — Discharge Summary (Signed)
Discharge Summary    Patient ID: [name] [name],  MRN: [id number], DOB/AGE: [id number] y.o.  Admit date: 07/23/2016 Discharge date: 07/25/2016  Primary Care Provider: Anda Kraft Primary Cardiologist: Ellyn Hack  Discharge Diagnoses    Active Problems:   Acute ST elevation myocardial infarction (STEMI) involving right coronary artery [facility])   Essential hypertension   Dyslipidemia, goal LDL below 70   Tobacco abuse counseling   STEMI involving right coronary artery (HCC)   Allergies Allergies  Allergen Reactions  . Iohexol Other (See Comments)     Code: HIVES, Desc: PT STATES HE HAD AN IVP 15 YRS AGO AND HAD SOB AND BROKE OUT IN HIVES., Onset Date: [id number]   . Ivp Dye [Iodinated Diagnostic Agents] Other (See Comments)    intolerance    Diagnostic Studies/Procedures    LHC: 07/23/16  Conclusion   1. 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 Widely patent LAD stent with minimal nonobstructive disease 3. Patent left main and left circumflex with no obstruction 4. Mild segmental contraction abnormality of the left ventricle with akinesis of the basal inferior wall and hypokinesis of the mid inferior wall, consistent with this patient's presentation of acute inferior MI. The LVEF is preserved at 55%. 5. Successful PCI of the right coronary artery using aspiration thrombectomy and drug-eluting stent implantation (3.5 x 38 mm Promus DES)  Recommendations: Patient will be monitored in the ICU. He is hemodynamically stable. He is loaded with brilinta 180 mg during the procedure on the Cath Lab table. Aggrastat will be continued for 12 hours unless there are bleeding complications. The patient would be eligible for discharge Monday morning as long as he is stable with no complications.  _____________   History of Present Illness     61 yo male with PMH of CAD who presented with bilateral arm pain, and diaphoresis.  Presented to the ED as a STEMI.   Hospital Course     Underwent cardiac cath with Dr. Burt Knack noted above, with Promus DES x1 to RCA with aspiration thrombectomy. He was continued on aggrastat for 12 hours post cath. Planned for DAPT with ASA and Brlinita for at least one year. LV gram showed preserved LV function. Labs were stable post cath. Troponin peaked at 23.44. He was transferred to telemetry on 07/24/16. Did have some episodes of ectopy on the monitor which improved with BB therapy. LDL was below goal (51) on statin therapy. He reported no further episodes of chest pain. Worked well with cardiac rehab. Counseled on smoking cessation.   He was seen by Dr. Gwenlyn Found and determined stable for discharge home. Follow up in the office has been arranged. Medications are listed below.   General: Well developed, well nourished, male appearing in no acute distress. Head: Normocephalic, atraumatic.  Neck: Supple without bruits, JVD. Lungs:  Resp regular and unlabored, CTA. Heart: RRR, S1, S2, no S3, S4, or murmur; no rub. Abdomen: Soft, non-tender, non-distended with normoactive bowel sounds. No hepatomegaly. No rebound/guarding. No obvious abdominal masses. Extremities: No clubbing, cyanosis, edema. Distal pedal pulses are 2+ bilaterally. R radial cath site stable without bruising or hematoma Neuro: Alert and oriented X 3. Moves all extremities spontaneously. Psych: Normal affect.  _____________  Discharge Vitals Blood pressure 109/61, pulse 77, temperature 97.6 F (36.4 C), temperature source Oral, resp. rate 20, height '5\' 9"'$  (1.753 m), weight 255 lb 14.4 oz (116.1 kg), SpO2 98 %.  Filed Weights  07/23/16 2124 07/24/16 0534 07/25/16 0300  Weight: 260 lb (117.9 kg) 254 lb 13.6 oz (115.6 kg) 255 lb 14.4 oz (116.1 kg)    Labs & Radiologic Studies    CBC  Recent Labs  07/23/16 2111  07/24/16 0617 07/25/16 0401  WBC 14.6*  < > 9.6 14.0*  NEUTROABS 11.5*  --   --   --   HGB 14.6  < > 13.3  13.0  HCT 44.2  < > 40.9 40.0  MCV 96.1  < > 96.7 96.6  PLT 331  < > 288 276  < > = values in this interval not displayed. Basic Metabolic Panel  Recent Labs  07/24/16 0617 07/25/16 0401  NA 136 137  K 5.1 4.5  CL 104 103  CO2 22 26  GLUCOSE 175* 135*  BUN 13 17  CREATININE 1.11 1.00  CALCIUM 9.1 9.0   Liver Function Tests  Recent Labs  07/22/16 1111 07/23/16 2111  AST 12 19  ALT 13 18  ALKPHOS 73 77  BILITOT 0.5 0.6  PROT 6.5 7.4  ALBUMIN 4.1 4.0   No results for input(s): LIPASE, AMYLASE in the last 72 hours. Cardiac Enzymes  Recent Labs  07/23/16 2354 07/24/16 0617 07/24/16 1053  TROPONINI 20.39* 23.44* 20.26*   BNP Invalid input(s): POCBNP D-Dimer No results for input(s): DDIMER in the last 72 hours. Hemoglobin A1C  Recent Labs  07/23/16 2354  HGBA1C 5.7*   Fasting Lipid Panel  Recent Labs  07/24/16 0616  CHOL 94  HDL 30*  LDLCALC 51  TRIG 66  CHOLHDL 3.1   Thyroid Function Tests No results for input(s): TSH, T4TOTAL, T3FREE, THYROIDAB in the last 72 hours.  Invalid input(s): FREET3 _____________  Dg Chest Portable 1 View  Result Date: 07/23/2016 CLINICAL DATA:  Chest pain and diaphoretic. EXAM: PORTABLE CHEST 1 VIEW COMPARISON:  07/20/2015 FINDINGS: Lungs are adequately inflated as the right costophrenic angle is cut off the field-of-view. Subtle hazy opacification over the left base just above the hemidiaphragm which may be due to atelectasis or infection. Mild stable cardiomegaly. Remainder of the exam is unchanged. IMPRESSION: Mild left base opacification which may be due to atelectasis or infection. Stable cardiomegaly. Electronically Signed   By: Marin Olp M.D.   On: 07/23/2016 21:34   Disposition   Pt is being discharged home today in good condition.  Follow-up Plans & Appointments    Follow-up Information    Erma Heritage, PA-C Follow up on 08/05/2016.   Specialties:  Physician Assistant, Cardiology Why:  at 11am  for your follow up appt.  Contact information: [address] [place] [address] [phone]          Discharge Instructions    Amb Referral to Cardiac Rehabilitation    Complete by:  As directed    Has done program before. Pt stated not physically able to do now.   Diagnosis:   STEMI Coronary Stents     Call MD for:  redness, tenderness, or signs of infection (pain, swelling, redness, odor or green/yellow discharge around incision site)    Complete by:  As directed    Diet - low sodium heart healthy    Complete by:  As directed    Discharge instructions    Complete by:  As directed    Radial Site Care Refer to this sheet in the next few weeks. These instructions provide you with information on caring for yourself after your procedure. Your caregiver may  also give you more specific instructions. Your treatment has been planned according to current medical practices, but problems sometimes occur. Call your caregiver if you have any problems or questions after your procedure. HOME CARE INSTRUCTIONS You may shower the day after the procedure.Remove the bandage (dressing) and gently wash the site with plain soap and water.Gently pat the site dry.  Do not apply powder or lotion to the site.  Do not submerge the affected site in water for 3 to 5 days.  Inspect the site at least twice daily.  Do not flex or bend the affected arm for 24 hours.  No lifting over 5 pounds (2.3 kg) for 5 days after your procedure.  Do not drive home if you are discharged the same day of the procedure. Have someone else drive you.  You may drive 24 hours after the procedure unless otherwise instructed by your caregiver.  What to expect: Any bruising will usually fade within 1 to 2 weeks.  Blood that collects in the tissue (hematoma) may be painful to the touch. It should usually decrease in size and tenderness within 1 to 2 weeks.  SEEK IMMEDIATE MEDICAL CARE IF: You have unusual pain at  the radial site.  You have redness, warmth, swelling, or pain at the radial site.  You have drainage (other than a small amount of blood on the dressing).  You have chills.  You have a fever or persistent symptoms for more than 72 hours.  You have a fever and your symptoms suddenly get worse.  Your arm becomes pale, cool, tingly, or numb.  You have heavy bleeding from the site. Hold pressure on the site.    Please keep a log of your blood pressures at home. We have changed a number of your medications this admission. All changes are noted on your discharge paperwork.   Increase activity slowly    Complete by:  As directed       Discharge Medications   Current Discharge Medication List    START taking these medications   Details  metoprolol tartrate (LOPRESSOR) 25 MG tablet Take 1 tablet (25 mg total) by mouth 2 (two) times daily. Qty: 30 tablet, Refills: 3    ticagrelor (BRILINTA) 90 MG TABS tablet Take 1 tablet (90 mg total) by mouth 2 (two) times daily. Qty: 60 tablet, Refills: 0      CONTINUE these medications which have NOT CHANGED   Details  acetaminophen (TYLENOL) 500 MG tablet Take 500-1,000 mg by mouth every 6 (six) hours as needed for moderate pain or headache.    ALPRAZolam (XANAX) 0.5 MG tablet Take 0.5 mg by mouth 3 (three) times daily.    aspirin EC 81 MG tablet Take 1 tablet (81 mg total) by mouth daily. Qty: 90 tablet, Refills: 3    betamethasone valerate (VALISONE) 0.1 % cream Apply 1 application topically as needed (for rash).     calcium carbonate (TUMS EX) 750 MG chewable tablet Chew 2-4 tablets by mouth daily as needed for heartburn.     Cholecalciferol (VITAMIN D-3) 1000 UNITS CAPS Take 1,000 Units by mouth daily.     fluticasone (FLONASE) 50 MCG/ACT nasal spray Place 1 spray into both nostrils daily as needed for allergies.     Fluticasone-Salmeterol (ADVAIR) 100-50 MCG/DOSE AEPB Inhale 1 puff into the lungs 2 (two) times daily as needed (for  shortness of breath).     furosemide (LASIX) 40 MG tablet Take 1 tablet (40 mg total) by mouth as  needed. Qty: 45 tablet, Refills: 3    hydrOXYzine (ATARAX/VISTARIL) 50 MG tablet Take 50 mg by mouth 3 (three) times daily.    lisinopril (PRINIVIL,ZESTRIL) 10 MG tablet Take 1 tablet (10 mg total) by mouth daily. Qty: 90 tablet, Refills: 3    morphine (MSIR) 30 MG tablet Take 30 mg by mouth 4 (four) times daily as needed (pain).     nitroGLYCERIN (NITROSTAT) 0.4 MG SL tablet Place 1 tablet (0.4 mg total) under the tongue every 5 (five) minutes as needed for chest pain. Qty: 25 tablet, Refills: 11    potassium chloride SA (K-DUR,KLOR-CON) 20 MEQ tablet Take a tablet if you use lf furosemide as needed daily Qty: 45 tablet, Refills: 3    rosuvastatin (CRESTOR) 20 MG tablet Take 1 tablet (20 mg total) by mouth daily. Qty: 90 tablet, Refills: 3      STOP taking these medications     clopidogrel (PLAVIX) 75 MG tablet      diltiazem (CARDIZEM CD) 120 MG 24 hr capsule      isosorbide mononitrate (IMDUR) 30 MG 24 hr tablet      saccharomyces boulardii (FLORASTOR) 250 MG capsule      silver sulfADIAZINE (SILVADENE) 1 % cream          Aspirin prescribed at discharge?  Yes High Intensity Statin Prescribed? (Lipitor 40-'80mg'$  or Crestor 20-'40mg'$ ): Yes Beta Blocker Prescribed? Yes For EF <40%, was ACEI/ARB Prescribed? Yes ADP Receptor Inhibitor Prescribed? (i.e. Plavix etc.-Includes Medically Managed Patients): Yes For EF <40%, Aldosterone Inhibitor Prescribed? No: EF ok Was EF assessed during THIS hospitalization? Yes Was Cardiac Rehab II ordered? (Included Medically managed Patients): Yes   Outstanding Labs/Studies   BMET at follow up visit.   Duration of Discharge Encounter   Greater than 30 minutes including physician time.  Signed, Reino Bellis NP-C 07/25/2016, 10:40 AM  Agree with note by Reino Bellis NP-C  Mr Bischof is postop day 2 inferior STEMI. His LAD stent is  widely patent. His EF is preserved. His exam is benign. The remainder of his labs are okay. We talked about the importance of risk factor modification and smoking cessation. I suspect he is not interested in stopping smoking. He is pain-free. He is okay for discharge home this morning. Follow-up with Dr. Ellyn Hack.  Lorretta Harp, M.D., [place], [facility], [place] [address] [place], [place]  [address]  [phone] 07/25/2016 10:40 AM

## 2016-07-25 NOTE — Progress Notes (Signed)
CARDIAC REHAB PHASE I   PRE:  Rate/Rhythm: 62 SR  BP:  Supine:   Sitting: 139/62  Standing:    SaO2: 95%RA  MODE:  Ambulation: 420 ft   POST:  Rate/Rhythm: 88 SR  BP:  Supine:   Sitting: 115/63  Standing:    SaO2: 96%RA 0820-0916 Pt walked 420 ft on RA and tired by end of walk. Some DOE noted. Had encouraged pt to stop and rest if needed. No c/o CP. Stated legs hurting by end of walk. Stated he can only walk very short distances so did not give ex ed. Pt stated he does not want to do CRP 2 again as he did it before and he stated he cannot physically do now. Will refer to [facility] with note that pt unable to do. Began to discuss the importance of brilinta with stent and pt stated he was becoming agitated. I told him it had to be taken twice a day but he stated he was on a pill that was once a day. Tried to explain that this was a new pill like the plavix he took before and he stated he was overwhelmed. Asked pt what he would like for me to review and he stated he had been through this before and had done the program. Left heart healthy diet, smoking cessation handout and he wanted fake cigarette.  He was informed that case manager will give him a brilinta card. Pt assured me he will take his meds as prescribed.   Graylon Good, RN BSN  07/25/2016 9:09 AM

## 2016-07-27 ENCOUNTER — Telehealth: Payer: Self-pay | Admitting: Cardiology

## 2016-07-27 NOTE — Telephone Encounter (Signed)
New message     Pt states he slept on his left side and he woke up this morning feeling like his chest is bruised, not actually chest pain

## 2016-07-27 NOTE — Telephone Encounter (Signed)
Returned call to patient.He stated he fell asleep last night and slept on left side he normally sleeps on rt side.He feels like heart is bruised.Left chest feels sore.He wanted to make sure this is normal.Advised he can take Tylenol.While talking to patient he stated left chest feeling better.Advised I will send message to [facility].

## 2016-07-27 NOTE — Telephone Encounter (Signed)
Sounds like a good plan.  [place]
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
## 2016-08-05 ENCOUNTER — Ambulatory Visit (INDEPENDENT_AMBULATORY_CARE_PROVIDER_SITE_OTHER): Payer: PPO | Admitting: Student

## 2016-08-05 ENCOUNTER — Encounter: Payer: Self-pay | Admitting: Student

## 2016-08-05 VITALS — BP 90/58 | HR 60 | Ht 69.0 in | Wt 256.0 lb

## 2016-08-05 DIAGNOSIS — I10 Essential (primary) hypertension: Secondary | ICD-10-CM | POA: Diagnosis not present

## 2016-08-05 DIAGNOSIS — I213 ST elevation (STEMI) myocardial infarction of unspecified site: Secondary | ICD-10-CM

## 2016-08-05 DIAGNOSIS — Z716 Tobacco abuse counseling: Secondary | ICD-10-CM | POA: Diagnosis not present

## 2016-08-05 DIAGNOSIS — I2510 Atherosclerotic heart disease of native coronary artery without angina pectoris: Secondary | ICD-10-CM

## 2016-08-05 DIAGNOSIS — E785 Hyperlipidemia, unspecified: Secondary | ICD-10-CM

## 2016-08-05 MED ORDER — LISINOPRIL 5 MG PO TABS: 5.0000 mg | ORAL_TABLET | Freq: Every day | ORAL | Status: DC

## 2016-08-05 MED ORDER — VARENICLINE TARTRATE 0.5 MG X 11 & 1 MG X 42 PO MISC: ORAL | 0 refills | Status: DC

## 2016-08-05 MED ORDER — TICAGRELOR 90 MG PO TABS: 90.0000 mg | ORAL_TABLET | Freq: Two times a day (BID) | ORAL | 3 refills | Status: DC

## 2016-08-05 NOTE — Patient Instructions (Signed)
Medication Instructions: Please start Chantix 0.5 mg tablet once daily for three days, then take a 0.5 mg tablet twice daily for 4 days, then take 1 mg tablet twice daily.  A starter pack prescription has been provided. Your Lisinopril has been reduced to 5 mg daily.   Follow-Up: Your physician recommends that you schedule a follow-up appointment in: 3 months with Dr. Ellyn Hack.    If you need a refill on your cardiac medications before your next appointment, please call your pharmacy.

## 2016-08-18 DIAGNOSIS — E789 Disorder of lipoprotein metabolism, unspecified: Secondary | ICD-10-CM | POA: Diagnosis not present

## 2016-08-18 DIAGNOSIS — I2510 Atherosclerotic heart disease of native coronary artery without angina pectoris: Secondary | ICD-10-CM | POA: Diagnosis not present

## 2016-08-18 DIAGNOSIS — R079 Chest pain, unspecified: Secondary | ICD-10-CM | POA: Diagnosis not present

## 2016-08-29 ENCOUNTER — Other Ambulatory Visit: Payer: Self-pay | Admitting: Student

## 2016-08-31 ENCOUNTER — Other Ambulatory Visit: Payer: Self-pay | Admitting: *Deleted

## 2016-08-31 MED ORDER — VARENICLINE TARTRATE 1 MG PO TABS: 1.0000 mg | ORAL_TABLET | Freq: Two times a day (BID) | ORAL | 4 refills | Status: DC

## 2016-08-31 NOTE — Telephone Encounter (Signed)
PATIENT IS USING CHANTIX FOR THE FIRST MONTH , D/C STARTER PACK , STARTED MAINTENANCE DOSE  PATIENT AWARE WILL PICK MEDICATION WITH 4 REFILLS.  PATIENT STATES HE HAS STOPPED SMOKING

## 2016-09-12 ENCOUNTER — Telehealth: Payer: Self-pay | Admitting: Cardiology

## 2016-09-12 NOTE — Telephone Encounter (Signed)
S/w pt states that he has been having exertional SOB since starting the Brilinta ~07-22-16 he states that he thought at first that he was just recovering from Left heart cath and that was why he has'nt call until now. He states that he is also SOB when lying down and wakes up in the middle of the night SOB. He would like to try another medication, please advise

## 2016-09-12 NOTE — Telephone Encounter (Signed)
Mr.Gryder is calling because he just had stent placed on 07/23/16 and he is having breathing issues . Thinks its coming from the medication. Please call and Please allow him some time to get the phone . Thanks

## 2016-09-12 NOTE — Telephone Encounter (Signed)
Brilinta does not cause exertional dyspnea. It has a sensation of trying to catch her breath, but is not related dyspnea.  However, we can convert him to Effient 10 mg daily.  He will need to come in to get samples - but not hold Brilinta until he starts Effient the next day.  Glenetta Hew, MD

## 2016-09-13 MED ORDER — PRASUGREL HCL 10 MG PO TABS: 10.0000 mg | ORAL_TABLET | Freq: Every day | ORAL | 6 refills | Status: DC

## 2016-09-13 NOTE — Telephone Encounter (Signed)
Effient is not sampling any longer(no co-pay cards either) will send rx to pharmacy.  Pt notified he will call back if anything else is needed

## 2016-10-25 DIAGNOSIS — Z0000 Encounter for general adult medical examination without abnormal findings: Secondary | ICD-10-CM | POA: Diagnosis not present

## 2016-10-25 DIAGNOSIS — M25422 Effusion, left elbow: Secondary | ICD-10-CM | POA: Diagnosis not present

## 2016-10-31 DIAGNOSIS — M25422 Effusion, left elbow: Secondary | ICD-10-CM | POA: Diagnosis not present

## 2016-10-31 DIAGNOSIS — M7022 Olecranon bursitis, left elbow: Secondary | ICD-10-CM | POA: Diagnosis not present

## 2016-11-08 ENCOUNTER — Encounter: Payer: Self-pay | Admitting: Cardiology

## 2016-11-08 ENCOUNTER — Ambulatory Visit (INDEPENDENT_AMBULATORY_CARE_PROVIDER_SITE_OTHER): Payer: PPO | Admitting: Cardiology

## 2016-11-08 DIAGNOSIS — I213 ST elevation (STEMI) myocardial infarction of unspecified site: Secondary | ICD-10-CM | POA: Diagnosis not present

## 2016-11-08 DIAGNOSIS — E669 Obesity, unspecified: Secondary | ICD-10-CM

## 2016-11-08 DIAGNOSIS — I739 Peripheral vascular disease, unspecified: Secondary | ICD-10-CM

## 2016-11-08 DIAGNOSIS — Z720 Tobacco use: Secondary | ICD-10-CM | POA: Diagnosis not present

## 2016-11-08 DIAGNOSIS — I10 Essential (primary) hypertension: Secondary | ICD-10-CM

## 2016-11-08 DIAGNOSIS — E785 Hyperlipidemia, unspecified: Secondary | ICD-10-CM

## 2016-11-08 DIAGNOSIS — R600 Localized edema: Secondary | ICD-10-CM | POA: Diagnosis not present

## 2016-11-08 DIAGNOSIS — I2510 Atherosclerotic heart disease of native coronary artery without angina pectoris: Secondary | ICD-10-CM

## 2016-11-08 DIAGNOSIS — Z9861 Coronary angioplasty status: Secondary | ICD-10-CM

## 2016-11-08 MED ORDER — FUROSEMIDE 40 MG PO TABS: 40.0000 mg | ORAL_TABLET | Freq: Every day | ORAL | 3 refills | Status: DC

## 2016-11-08 MED ORDER — POTASSIUM CHLORIDE CRYS ER 20 MEQ PO TBCR: 20.0000 meq | EXTENDED_RELEASE_TABLET | Freq: Every day | ORAL | 3 refills | Status: DC

## 2016-11-08 MED ORDER — VARENICLINE TARTRATE 1 MG PO TABS
1.0000 mg | ORAL_TABLET | Freq: Two times a day (BID) | ORAL | 4 refills | Status: DC
Start: 2016-11-08 — End: 2020-08-31

## 2016-11-08 NOTE — Patient Instructions (Addendum)
START TAKING LASIX 40 MG ONE TABLET   DAILY WITH  POTASSIUM 20 MEQ ONE TABLET DAILY.   NO OTHER CHANGES  Your physician wants you to follow-up in [place]. You will receive a reminder letter in the mail two months in advance. If you don't receive a letter, please call our office to schedule the follow-up appointment.   If you need a refill on your cardiac medications before your next appointment, please call your pharmacy.
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
## 2016-11-10 ENCOUNTER — Encounter: Payer: Self-pay | Admitting: Cardiology

## 2016-11-10 DIAGNOSIS — Z0000 Encounter for general adult medical examination without abnormal findings: Secondary | ICD-10-CM | POA: Diagnosis not present

## 2016-11-10 DIAGNOSIS — I10 Essential (primary) hypertension: Secondary | ICD-10-CM | POA: Diagnosis not present

## 2016-11-10 NOTE — Assessment & Plan Note (Signed)
Unfortunately, when I asked him to describe the symptoms that he had leading up to his STEMI, he says the similar symptoms is feeling now. [place] very difficult to know just exactly what his MI symptoms are. At present, I think he may be having a little more orthopnea symptoms because he stopped his Lasix. I would like to restart his Lasix.  Otherwise, he is on a beta blocker and ACE inhibitor as well as diltiazem. Cannot necessarily titrate up these medications. On follow-up, we can consider switching to just metoprolol from diltiazem, however there is an antianginal benefit to diltiazem as well. He is on statin and stable dose as well as aspirin Effient. No bleeding issues. If he continues to have chest pain issues, may need to consider relook Myoview, however his anatomy otherwise did not necessarily show any significant stenoses elsewhere.  He continues to use sublingual nitroglycerin and he may want to consider long-acting nitrate as I think Ranexa will probably be too expensive for him.

## 2016-11-10 NOTE — Assessment & Plan Note (Signed)
3 min smoking cessation counseling. He seems to be doing relatively well with his smoking cessation and is quite proud of himself. We will refill Chantix for 1 more month. I congratulated him on his efforts.

## 2016-11-10 NOTE — Assessment & Plan Note (Addendum)
He is on moderate dose of rosuvastatin. Labs were checked back in May, and look pretty well-controlled.   Probably don't need another follow-up until when he is seen in follow-up. Continue current dose of statin

## 2016-11-10 NOTE — Assessment & Plan Note (Signed)
Not exactly sure why he stopped his Lasix, but since he is now having some more swelling in orthopnea/PND, we will have him restart his Lasix to take a daily as opposed to simply as needed.

## 2016-11-10 NOTE — Assessment & Plan Note (Signed)
The patient understands the need to lose weight with diet and exercise. We have discussed specific strategies for this.  

## 2016-11-10 NOTE — Assessment & Plan Note (Signed)
Arterial Dopplers did not show any evidence of significant disease. Continue to monitor for symptoms.

## 2016-11-10 NOTE — Assessment & Plan Note (Signed)
He had preserved EF on his LV gram during the MI, so no reason to suspect that he has any cardiomyopathy as a residual. He is on a beta blocker, ACE inhibitor as well as diltiazem which was a long-standing medication for him. He is on statin aspirin and Effient.  Overall, he seems to recover from his MI without significant heart failure symptoms and preserved EF. Unfortunately, he still has episodes of chest discomfort which are difficult to explain. Other than the occluded RCA, there was no real significant culprit lesion on his diagnostic.

## 2016-11-10 NOTE — Assessment & Plan Note (Signed)
Blood pressure looks pretty good on current medications. He is now a combination of diltiazem and metoprolol which would allow [place] to potentially convert to one out of 2 versus taking advantage of the calcium channel blocker effect.  For now would just continue current medications

## 2016-11-16 DIAGNOSIS — Z87891 Personal history of nicotine dependence: Secondary | ICD-10-CM | POA: Diagnosis not present

## 2016-11-16 DIAGNOSIS — F419 Anxiety disorder, unspecified: Secondary | ICD-10-CM | POA: Diagnosis not present

## 2016-11-16 DIAGNOSIS — I10 Essential (primary) hypertension: Secondary | ICD-10-CM | POA: Diagnosis not present

## 2016-11-16 DIAGNOSIS — Z23 Encounter for immunization: Secondary | ICD-10-CM | POA: Diagnosis not present

## 2016-11-16 DIAGNOSIS — R739 Hyperglycemia, unspecified: Secondary | ICD-10-CM | POA: Diagnosis not present

## 2016-11-16 DIAGNOSIS — G894 Chronic pain syndrome: Secondary | ICD-10-CM | POA: Diagnosis not present

## 2016-11-16 DIAGNOSIS — E789 Disorder of lipoprotein metabolism, unspecified: Secondary | ICD-10-CM | POA: Diagnosis not present

## 2016-11-16 DIAGNOSIS — Z79899 Other long term (current) drug therapy: Secondary | ICD-10-CM | POA: Diagnosis not present

## 2016-11-17 ENCOUNTER — Other Ambulatory Visit (HOSPITAL_COMMUNITY): Payer: Self-pay | Admitting: Respiratory Therapy

## 2016-11-17 DIAGNOSIS — Z87891 Personal history of nicotine dependence: Secondary | ICD-10-CM

## 2016-11-17 DIAGNOSIS — R0600 Dyspnea, unspecified: Secondary | ICD-10-CM

## 2016-11-18 DIAGNOSIS — M7022 Olecranon bursitis, left elbow: Secondary | ICD-10-CM | POA: Diagnosis not present

## 2016-11-18 DIAGNOSIS — M170 Bilateral primary osteoarthritis of knee: Secondary | ICD-10-CM | POA: Diagnosis not present

## 2016-11-18 DIAGNOSIS — M25422 Effusion, left elbow: Secondary | ICD-10-CM | POA: Diagnosis not present

## 2016-11-18 DIAGNOSIS — M25561 Pain in right knee: Secondary | ICD-10-CM | POA: Diagnosis not present

## 2016-11-18 DIAGNOSIS — M109 Gout, unspecified: Secondary | ICD-10-CM | POA: Diagnosis not present

## 2016-11-21 DIAGNOSIS — M47816 Spondylosis without myelopathy or radiculopathy, lumbar region: Secondary | ICD-10-CM | POA: Diagnosis not present

## 2016-11-21 DIAGNOSIS — G894 Chronic pain syndrome: Secondary | ICD-10-CM | POA: Diagnosis not present

## 2016-11-21 DIAGNOSIS — M47812 Spondylosis without myelopathy or radiculopathy, cervical region: Secondary | ICD-10-CM | POA: Diagnosis not present

## 2016-11-21 DIAGNOSIS — Z79891 Long term (current) use of opiate analgesic: Secondary | ICD-10-CM | POA: Diagnosis not present

## 2016-11-21 DIAGNOSIS — M150 Primary generalized (osteo)arthritis: Secondary | ICD-10-CM | POA: Diagnosis not present

## 2016-11-21 DIAGNOSIS — M7500 Adhesive capsulitis of unspecified shoulder: Secondary | ICD-10-CM | POA: Diagnosis not present

## 2016-11-23 DIAGNOSIS — Z79899 Other long term (current) drug therapy: Secondary | ICD-10-CM | POA: Diagnosis not present

## 2016-11-23 DIAGNOSIS — J0110 Acute frontal sinusitis, unspecified: Secondary | ICD-10-CM | POA: Diagnosis not present

## 2016-11-23 DIAGNOSIS — I10 Essential (primary) hypertension: Secondary | ICD-10-CM | POA: Diagnosis not present

## 2016-11-23 DIAGNOSIS — F419 Anxiety disorder, unspecified: Secondary | ICD-10-CM | POA: Diagnosis not present

## 2016-11-25 ENCOUNTER — Ambulatory Visit (HOSPITAL_COMMUNITY)
Admission: RE | Admit: 2016-11-25 | Discharge: 2016-11-25 | Disposition: A | Discharge status: Home / Self Care | Payer: PPO | Source: Ambulatory Visit | Attending: Internal Medicine | Admitting: Internal Medicine

## 2016-11-25 DIAGNOSIS — Z87891 Personal history of nicotine dependence: Secondary | ICD-10-CM | POA: Diagnosis not present

## 2016-11-25 DIAGNOSIS — R0600 Dyspnea, unspecified: Secondary | ICD-10-CM | POA: Diagnosis not present

## 2016-11-25 DIAGNOSIS — J449 Chronic obstructive pulmonary disease, unspecified: Secondary | ICD-10-CM | POA: Insufficient documentation

## 2016-11-25 LAB — PULMONARY FUNCTION TEST
DL/VA % PRED: 93 %
DL/VA: 4.27 ml/min/mmHg/L
DLCO UNC % PRED: 80 %
DLCO unc: 24.8 ml/min/mmHg
FEF 25-75 PRE: 1.36 L/s
FEF2575-%Pred-Pre: 47 %
FEV1-%Pred-Pre: 64 %
FEV1-PRE: 2.22 L
FEV1FVC-%Pred-Pre: 92 %
FEV6-%PRED-PRE: 72 %
FEV6-Pre: 3.16 L
FEV6FVC-%PRED-PRE: 103 %
FVC-%PRED-PRE: 70 %
FVC-PRE: 3.21 L
PRE FEV6/FVC RATIO: 98 %
Pre FEV1/FVC ratio: 69 %
RV % pred: 183 %
RV: 4.06 L
TLC % pred: 106 %
TLC: 7.23 L

## 2016-12-14 DIAGNOSIS — G894 Chronic pain syndrome: Secondary | ICD-10-CM | POA: Diagnosis not present

## 2016-12-14 DIAGNOSIS — Z79891 Long term (current) use of opiate analgesic: Secondary | ICD-10-CM | POA: Diagnosis not present

## 2016-12-14 DIAGNOSIS — M47812 Spondylosis without myelopathy or radiculopathy, cervical region: Secondary | ICD-10-CM | POA: Diagnosis not present

## 2016-12-14 DIAGNOSIS — M150 Primary generalized (osteo)arthritis: Secondary | ICD-10-CM | POA: Diagnosis not present

## 2016-12-15 DIAGNOSIS — J439 Emphysema, unspecified: Secondary | ICD-10-CM | POA: Diagnosis not present

## 2016-12-15 DIAGNOSIS — E7439 Other disorders of intestinal carbohydrate absorption: Secondary | ICD-10-CM | POA: Diagnosis not present

## 2016-12-15 DIAGNOSIS — I2510 Atherosclerotic heart disease of native coronary artery without angina pectoris: Secondary | ICD-10-CM | POA: Diagnosis not present

## 2016-12-15 DIAGNOSIS — F419 Anxiety disorder, unspecified: Secondary | ICD-10-CM | POA: Diagnosis not present

## 2016-12-15 DIAGNOSIS — N183 Chronic kidney disease, stage 3 (moderate): Secondary | ICD-10-CM | POA: Diagnosis not present

## 2016-12-15 DIAGNOSIS — M109 Gout, unspecified: Secondary | ICD-10-CM | POA: Diagnosis not present

## 2017-01-05 DIAGNOSIS — M109 Gout, unspecified: Secondary | ICD-10-CM | POA: Diagnosis not present

## 2017-01-05 DIAGNOSIS — M25561 Pain in right knee: Secondary | ICD-10-CM | POA: Diagnosis not present

## 2017-01-05 DIAGNOSIS — M170 Bilateral primary osteoarthritis of knee: Secondary | ICD-10-CM | POA: Diagnosis not present

## 2017-01-05 DIAGNOSIS — M7022 Olecranon bursitis, left elbow: Secondary | ICD-10-CM | POA: Diagnosis not present

## 2017-01-05 DIAGNOSIS — K529 Noninfective gastroenteritis and colitis, unspecified: Secondary | ICD-10-CM | POA: Diagnosis not present

## 2017-01-05 DIAGNOSIS — Z79899 Other long term (current) drug therapy: Secondary | ICD-10-CM | POA: Diagnosis not present

## 2017-01-12 ENCOUNTER — Other Ambulatory Visit: Payer: Self-pay

## 2017-01-12 ENCOUNTER — Inpatient Hospital Stay (HOSPITAL_COMMUNITY)
Admission: EM | Admit: 2017-01-12 | Discharge: 2017-01-17 | DRG: 683 | Disposition: A | Discharge status: Home / Self Care | Payer: PPO | Attending: Internal Medicine | Admitting: Internal Medicine

## 2017-01-12 ENCOUNTER — Encounter (HOSPITAL_COMMUNITY): Payer: Self-pay

## 2017-01-12 DIAGNOSIS — T464X5A Adverse effect of angiotensin-converting-enzyme inhibitors, initial encounter: Secondary | ICD-10-CM | POA: Diagnosis present

## 2017-01-12 DIAGNOSIS — R1111 Vomiting without nausea: Secondary | ICD-10-CM | POA: Diagnosis not present

## 2017-01-12 DIAGNOSIS — E785 Hyperlipidemia, unspecified: Secondary | ICD-10-CM | POA: Diagnosis not present

## 2017-01-12 DIAGNOSIS — E871 Hypo-osmolality and hyponatremia: Secondary | ICD-10-CM | POA: Diagnosis not present

## 2017-01-12 DIAGNOSIS — F419 Anxiety disorder, unspecified: Secondary | ICD-10-CM | POA: Diagnosis present

## 2017-01-12 DIAGNOSIS — Z79891 Long term (current) use of opiate analgesic: Secondary | ICD-10-CM | POA: Diagnosis not present

## 2017-01-12 DIAGNOSIS — E860 Dehydration: Secondary | ICD-10-CM | POA: Diagnosis present

## 2017-01-12 DIAGNOSIS — R112 Nausea with vomiting, unspecified: Secondary | ICD-10-CM | POA: Diagnosis not present

## 2017-01-12 DIAGNOSIS — I2510 Atherosclerotic heart disease of native coronary artery without angina pectoris: Secondary | ICD-10-CM | POA: Diagnosis not present

## 2017-01-12 DIAGNOSIS — E669 Obesity, unspecified: Secondary | ICD-10-CM | POA: Diagnosis present

## 2017-01-12 DIAGNOSIS — N179 Acute kidney failure, unspecified: Principal | ICD-10-CM | POA: Diagnosis present

## 2017-01-12 DIAGNOSIS — G8929 Other chronic pain: Secondary | ICD-10-CM | POA: Diagnosis not present

## 2017-01-12 DIAGNOSIS — I959 Hypotension, unspecified: Secondary | ICD-10-CM

## 2017-01-12 DIAGNOSIS — M150 Primary generalized (osteo)arthritis: Secondary | ICD-10-CM | POA: Diagnosis not present

## 2017-01-12 DIAGNOSIS — I10 Essential (primary) hypertension: Secondary | ICD-10-CM | POA: Diagnosis present

## 2017-01-12 DIAGNOSIS — R197 Diarrhea, unspecified: Secondary | ICD-10-CM | POA: Diagnosis not present

## 2017-01-12 DIAGNOSIS — T501X5A Adverse effect of loop [high-ceiling] diuretics, initial encounter: Secondary | ICD-10-CM | POA: Diagnosis present

## 2017-01-12 DIAGNOSIS — Z6836 Body mass index (BMI) 36.0-36.9, adult: Secondary | ICD-10-CM

## 2017-01-12 DIAGNOSIS — Z87891 Personal history of nicotine dependence: Secondary | ICD-10-CM

## 2017-01-12 DIAGNOSIS — I9589 Other hypotension: Secondary | ICD-10-CM | POA: Diagnosis not present

## 2017-01-12 DIAGNOSIS — Z8249 Family history of ischemic heart disease and other diseases of the circulatory system: Secondary | ICD-10-CM | POA: Diagnosis not present

## 2017-01-12 DIAGNOSIS — R001 Bradycardia, unspecified: Secondary | ICD-10-CM | POA: Diagnosis not present

## 2017-01-12 DIAGNOSIS — G894 Chronic pain syndrome: Secondary | ICD-10-CM | POA: Diagnosis not present

## 2017-01-12 DIAGNOSIS — D649 Anemia, unspecified: Secondary | ICD-10-CM | POA: Diagnosis present

## 2017-01-12 DIAGNOSIS — K219 Gastro-esophageal reflux disease without esophagitis: Secondary | ICD-10-CM | POA: Diagnosis not present

## 2017-01-12 DIAGNOSIS — E861 Hypovolemia: Secondary | ICD-10-CM | POA: Diagnosis not present

## 2017-01-12 DIAGNOSIS — N178 Other acute kidney failure: Secondary | ICD-10-CM | POA: Diagnosis not present

## 2017-01-12 DIAGNOSIS — M47812 Spondylosis without myelopathy or radiculopathy, cervical region: Secondary | ICD-10-CM | POA: Diagnosis not present

## 2017-01-12 DIAGNOSIS — J449 Chronic obstructive pulmonary disease, unspecified: Secondary | ICD-10-CM | POA: Diagnosis present

## 2017-01-12 DIAGNOSIS — Z9861 Coronary angioplasty status: Secondary | ICD-10-CM

## 2017-01-12 DIAGNOSIS — I252 Old myocardial infarction: Secondary | ICD-10-CM

## 2017-01-12 DIAGNOSIS — M797 Fibromyalgia: Secondary | ICD-10-CM | POA: Diagnosis present

## 2017-01-12 DIAGNOSIS — Z955 Presence of coronary angioplasty implant and graft: Secondary | ICD-10-CM | POA: Diagnosis not present

## 2017-01-12 DIAGNOSIS — N10 Acute tubulo-interstitial nephritis: Secondary | ICD-10-CM | POA: Diagnosis not present

## 2017-01-12 LAB — COMPREHENSIVE METABOLIC PANEL
ALT: 21 U/L (ref 17–63)
AST: 19 U/L (ref 15–41)
Albumin: 4.4 g/dL (ref 3.5–5.0)
Alkaline Phosphatase: 73 U/L (ref 38–126)
Anion gap: 13 (ref 5–15)
BUN: 49 mg/dL — ABNORMAL HIGH (ref 6–20)
CHLORIDE: 102 mmol/L (ref 101–111)
CO2: 18 mmol/L — AB (ref 22–32)
CREATININE: 5.64 mg/dL — AB (ref 0.61–1.24)
Calcium: 8.9 mg/dL (ref 8.9–10.3)
GFR calc non Af Amer: 10 mL/min — ABNORMAL LOW (ref >60)
GFR, EST AFRICAN AMERICAN: 11 mL/min — AB (ref >60)
Glucose, Bld: 103 mg/dL — ABNORMAL HIGH (ref 65–99)
Potassium: 4.8 mmol/L (ref 3.5–5.1)
SODIUM: 133 mmol/L — AB (ref 135–145)
Total Bilirubin: 0.7 mg/dL (ref 0.3–1.2)
Total Protein: 7.3 g/dL (ref 6.5–8.1)

## 2017-01-12 LAB — CBC
HCT: 33.9 % — ABNORMAL LOW (ref 39.0–52.0)
Hemoglobin: 11.3 g/dL — ABNORMAL LOW (ref 13.0–17.0)
MCH: 30 pg (ref 26.0–34.0)
MCHC: 33.3 g/dL (ref 30.0–36.0)
MCV: 89.9 fL (ref 78.0–100.0)
Platelets: 258 10*3/uL (ref 150–400)
RBC: 3.77 MIL/uL — AB (ref 4.22–5.81)
RDW: 13.1 % (ref 11.5–15.5)
WBC: 10 10*3/uL (ref 4.0–10.5)

## 2017-01-12 LAB — URINALYSIS, ROUTINE W REFLEX MICROSCOPIC
Bilirubin Urine: NEGATIVE
Glucose, UA: NEGATIVE mg/dL
HGB URINE DIPSTICK: NEGATIVE
Ketones, ur: NEGATIVE mg/dL
Leukocytes, UA: NEGATIVE
Nitrite: NEGATIVE
PROTEIN: 30 mg/dL — AB
Specific Gravity, Urine: 1.02 (ref 1.005–1.030)
pH: 5 (ref 5.0–8.0)

## 2017-01-12 LAB — LIPASE, BLOOD: LIPASE: 20 U/L (ref 11–51)

## 2017-01-12 MED ORDER — ONDANSETRON 4 MG PO TBDP
4.0000 mg | ORAL_TABLET | Freq: Once | ORAL | Status: AC | PRN
  Administered 2017-01-12: 4 mg via ORAL
  Filled 2017-01-12: qty 1

## 2017-01-12 MED ORDER — SODIUM CHLORIDE 0.9 % IV BOLUS (SEPSIS)
1000.0000 mL | Freq: Once | INTRAVENOUS | Status: AC
  Administered 2017-01-12: 1000 mL via INTRAVENOUS

## 2017-01-12 NOTE — ED Notes (Signed)
BP 77/55 (65).  EDP made aware. EDP ordering another bolus.

## 2017-01-12 NOTE — ED Notes (Signed)
EDP at bedside  

## 2017-01-12 NOTE — ED Notes (Signed)
No urine detected on bladder scan.

## 2017-01-12 NOTE — ED Notes (Signed)
Gave patient [place] sandwich, applesauce, and ginger ale to drink. Patient sitting up on side of bed eating.

## 2017-01-12 NOTE — ED Notes (Signed)
Pt's BP still low.  EDP aware. EDP ordering another bolus.

## 2017-01-12 NOTE — ED Triage Notes (Signed)
Pt presents with 1 week h/o nausea, vomiting and diarrhea.  Pt denies any abdominal pain, endorses generalized pain.  Pt was seen by PCP and referred here for IV fluids.

## 2017-01-12 NOTE — ED Notes (Signed)
PT states he is feeling better. A/O x 4.
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
## 2017-01-12 NOTE — ED Notes (Signed)
Patient states that he has been vomiting for 7 days now.  Denies any pain.  Does state that it feels like he needs to urinate but can't.  Low BP of 74/49.  Started bolus of fluid.
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
## 2017-01-13 ENCOUNTER — Other Ambulatory Visit: Payer: Self-pay

## 2017-01-13 ENCOUNTER — Inpatient Hospital Stay (HOSPITAL_COMMUNITY): Payer: PPO

## 2017-01-13 DIAGNOSIS — I959 Hypotension, unspecified: Secondary | ICD-10-CM | POA: Diagnosis present

## 2017-01-13 DIAGNOSIS — R112 Nausea with vomiting, unspecified: Secondary | ICD-10-CM | POA: Diagnosis present

## 2017-01-13 DIAGNOSIS — Z6836 Body mass index (BMI) 36.0-36.9, adult: Secondary | ICD-10-CM | POA: Diagnosis not present

## 2017-01-13 DIAGNOSIS — I2510 Atherosclerotic heart disease of native coronary artery without angina pectoris: Secondary | ICD-10-CM

## 2017-01-13 DIAGNOSIS — G8929 Other chronic pain: Secondary | ICD-10-CM | POA: Diagnosis not present

## 2017-01-13 DIAGNOSIS — G894 Chronic pain syndrome: Secondary | ICD-10-CM | POA: Diagnosis present

## 2017-01-13 DIAGNOSIS — Z9861 Coronary angioplasty status: Secondary | ICD-10-CM | POA: Diagnosis not present

## 2017-01-13 DIAGNOSIS — Z8249 Family history of ischemic heart disease and other diseases of the circulatory system: Secondary | ICD-10-CM | POA: Diagnosis not present

## 2017-01-13 DIAGNOSIS — F419 Anxiety disorder, unspecified: Secondary | ICD-10-CM

## 2017-01-13 DIAGNOSIS — T464X5A Adverse effect of angiotensin-converting-enzyme inhibitors, initial encounter: Secondary | ICD-10-CM | POA: Diagnosis present

## 2017-01-13 DIAGNOSIS — N179 Acute kidney failure, unspecified: Secondary | ICD-10-CM | POA: Diagnosis present

## 2017-01-13 DIAGNOSIS — E871 Hypo-osmolality and hyponatremia: Secondary | ICD-10-CM | POA: Diagnosis present

## 2017-01-13 DIAGNOSIS — K219 Gastro-esophageal reflux disease without esophagitis: Secondary | ICD-10-CM | POA: Diagnosis present

## 2017-01-13 DIAGNOSIS — R001 Bradycardia, unspecified: Secondary | ICD-10-CM | POA: Diagnosis present

## 2017-01-13 DIAGNOSIS — I252 Old myocardial infarction: Secondary | ICD-10-CM | POA: Diagnosis not present

## 2017-01-13 DIAGNOSIS — T501X5A Adverse effect of loop [high-ceiling] diuretics, initial encounter: Secondary | ICD-10-CM | POA: Diagnosis present

## 2017-01-13 DIAGNOSIS — I10 Essential (primary) hypertension: Secondary | ICD-10-CM | POA: Diagnosis present

## 2017-01-13 DIAGNOSIS — E860 Dehydration: Secondary | ICD-10-CM | POA: Diagnosis present

## 2017-01-13 DIAGNOSIS — E785 Hyperlipidemia, unspecified: Secondary | ICD-10-CM | POA: Diagnosis present

## 2017-01-13 DIAGNOSIS — Z955 Presence of coronary angioplasty implant and graft: Secondary | ICD-10-CM | POA: Diagnosis not present

## 2017-01-13 DIAGNOSIS — N10 Acute tubulo-interstitial nephritis: Secondary | ICD-10-CM | POA: Diagnosis present

## 2017-01-13 DIAGNOSIS — Z87891 Personal history of nicotine dependence: Secondary | ICD-10-CM | POA: Diagnosis not present

## 2017-01-13 DIAGNOSIS — M797 Fibromyalgia: Secondary | ICD-10-CM | POA: Diagnosis present

## 2017-01-13 DIAGNOSIS — J449 Chronic obstructive pulmonary disease, unspecified: Secondary | ICD-10-CM | POA: Diagnosis present

## 2017-01-13 DIAGNOSIS — D649 Anemia, unspecified: Secondary | ICD-10-CM | POA: Diagnosis present

## 2017-01-13 DIAGNOSIS — E669 Obesity, unspecified: Secondary | ICD-10-CM | POA: Diagnosis present

## 2017-01-13 DIAGNOSIS — R197 Diarrhea, unspecified: Secondary | ICD-10-CM

## 2017-01-13 LAB — URINALYSIS, ROUTINE W REFLEX MICROSCOPIC
BILIRUBIN URINE: NEGATIVE
Glucose, UA: NEGATIVE mg/dL
KETONES UR: NEGATIVE mg/dL
Leukocytes, UA: NEGATIVE
Nitrite: NEGATIVE
PH: 5 (ref 5.0–8.0)
PROTEIN: NEGATIVE mg/dL
Specific Gravity, Urine: 1.006 (ref 1.005–1.030)

## 2017-01-13 LAB — RETICULOCYTES
RBC.: 3.45 MIL/uL — AB (ref 4.22–5.81)
RETIC COUNT ABSOLUTE: 34.5 10*3/uL (ref 19.0–186.0)
Retic Ct Pct: 1 % (ref 0.4–3.1)

## 2017-01-13 LAB — BASIC METABOLIC PANEL
ANION GAP: 8 (ref 5–15)
BUN: 41 mg/dL — ABNORMAL HIGH (ref 6–20)
CALCIUM: 6.5 mg/dL — AB (ref 8.9–10.3)
CO2: 15 mmol/L — ABNORMAL LOW (ref 22–32)
Chloride: 113 mmol/L — ABNORMAL HIGH (ref 101–111)
Creatinine, Ser: 4.17 mg/dL — ABNORMAL HIGH (ref 0.61–1.24)
GFR, EST AFRICAN AMERICAN: 16 mL/min — AB (ref >60)
GFR, EST NON AFRICAN AMERICAN: 14 mL/min — AB (ref >60)
Glucose, Bld: 78 mg/dL (ref 65–99)
Potassium: 4 mmol/L (ref 3.5–5.1)
Sodium: 136 mmol/L (ref 135–145)

## 2017-01-13 LAB — CBC
HCT: 24.6 % — ABNORMAL LOW (ref 39.0–52.0)
Hemoglobin: 8 g/dL — ABNORMAL LOW (ref 13.0–17.0)
MCH: 29.4 pg (ref 26.0–34.0)
MCHC: 32.5 g/dL (ref 30.0–36.0)
MCV: 90.4 fL (ref 78.0–100.0)
PLATELETS: 169 10*3/uL (ref 150–400)
RBC: 2.72 MIL/uL — ABNORMAL LOW (ref 4.22–5.81)
RDW: 13.2 % (ref 11.5–15.5)
WBC: 5.8 10*3/uL (ref 4.0–10.5)

## 2017-01-13 LAB — FOLATE: FOLATE: 7.9 ng/mL (ref >5.9)

## 2017-01-13 LAB — IRON AND TIBC
Iron: 104 ug/dL (ref 45–182)
Saturation Ratios: 40 % — ABNORMAL HIGH (ref 17.9–39.5)
TIBC: 259 ug/dL (ref 250–450)
UIBC: 155 ug/dL

## 2017-01-13 LAB — PROCALCITONIN: Procalcitonin: 0.12 ng/mL

## 2017-01-13 LAB — INFLUENZA PANEL BY PCR (TYPE A & B)
INFLAPCR: NEGATIVE
INFLBPCR: NEGATIVE

## 2017-01-13 LAB — TYPE AND SCREEN
ABO/RH(D): O NEG
Antibody Screen: NEGATIVE

## 2017-01-13 LAB — ABO/RH: ABO/RH(D): O NEG

## 2017-01-13 LAB — TROPONIN I

## 2017-01-13 LAB — CORTISOL: Cortisol, Plasma: 4.1 ug/dL

## 2017-01-13 LAB — RAPID STREP SCREEN (MED CTR MEBANE ONLY): Streptococcus, Group A Screen (Direct): NEGATIVE

## 2017-01-13 LAB — FERRITIN: FERRITIN: 147 ng/mL (ref 24–336)

## 2017-01-13 LAB — MRSA PCR SCREENING: MRSA by PCR: NEGATIVE

## 2017-01-13 LAB — TSH: TSH: 0.887 u[IU]/mL (ref 0.350–4.500)

## 2017-01-13 LAB — LACTIC ACID, PLASMA: LACTIC ACID, VENOUS: 0.7 mmol/L (ref 0.5–1.9)

## 2017-01-13 LAB — VITAMIN B12: VITAMIN B 12: 181 pg/mL (ref 180–914)

## 2017-01-13 IMAGING — CT CT ABD-PELV W/O CM
2 of 4 series · 17 of 46 positions shown, 19 images · non-contrast
Comparison: CT abdomen pelvis [DATE]

CLINICAL DATA: Nausea, vomiting and diarrhea

EXAM:
CT ABDOMEN AND PELVIS WITHOUT CONTRAST
TECHNIQUE: Multidetector CT imaging of the abdomen and pelvis was performed
following the standard protocol without IV contrast.

[Series 3: ap without · axial · non-contrast · 0.94mm/px · z∈[+966,+1416]mm · 14 of 104 slices shown, 16 images]
[im 7/104  soft-tissue]
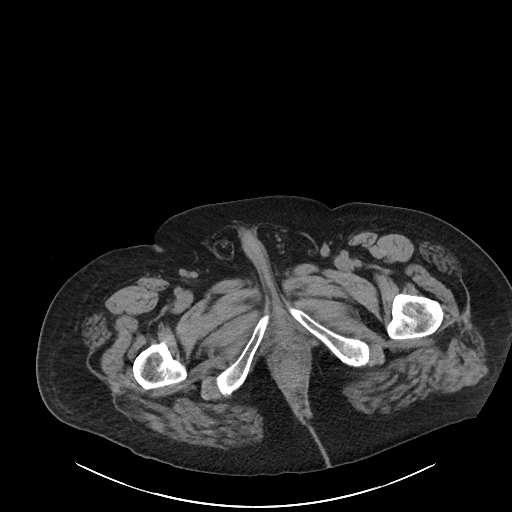
[im 7/104  bone]
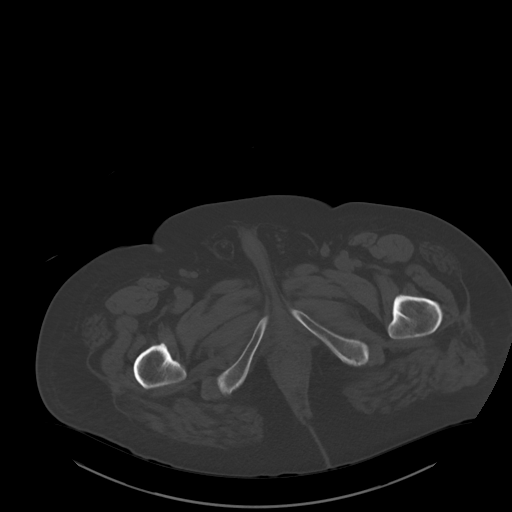
[im 13/104  soft-tissue]
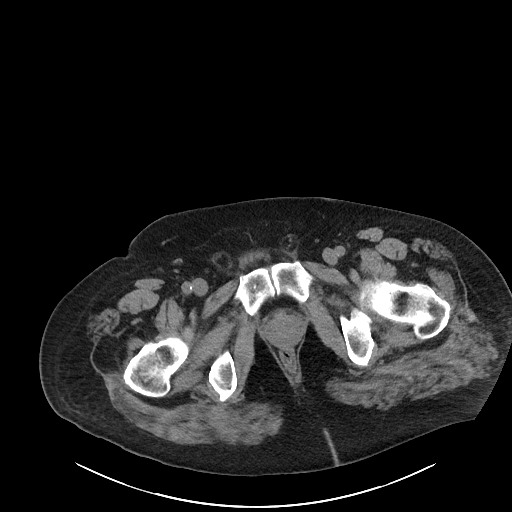
[im 19/104  soft-tissue]
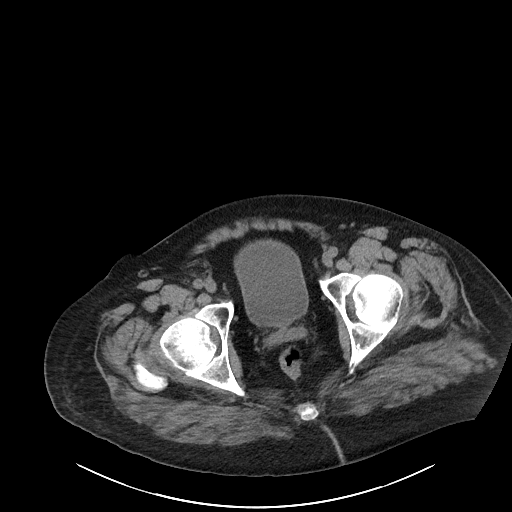
[im 31/104  soft-tissue]
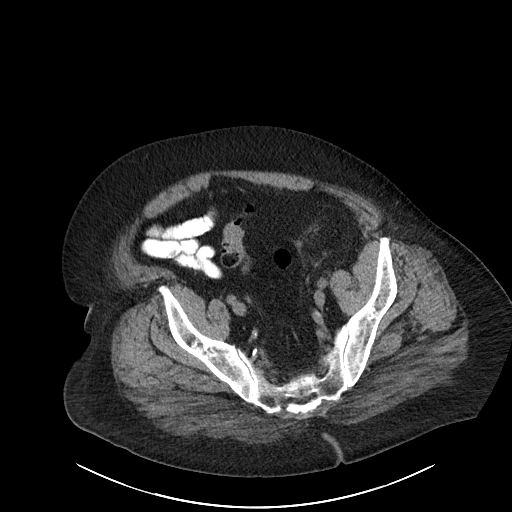
[im 37/104  soft-tissue]
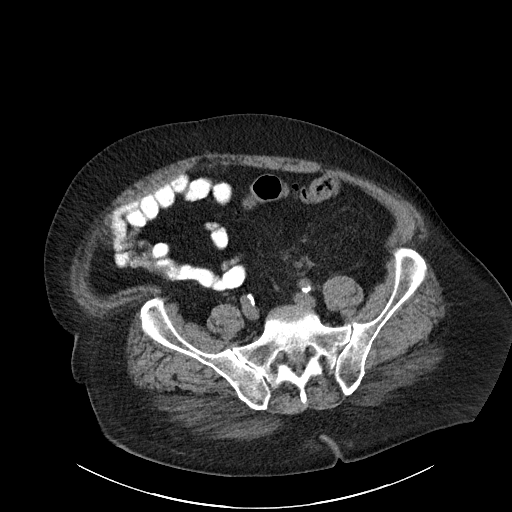
[im 43/104  soft-tissue]
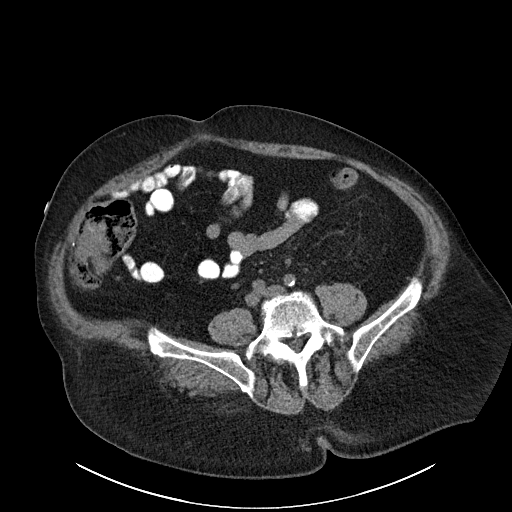
[im 49/104  soft-tissue]
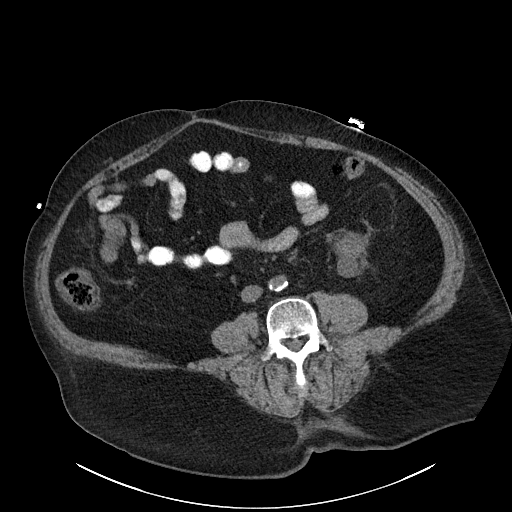
[im 55/104  soft-tissue]
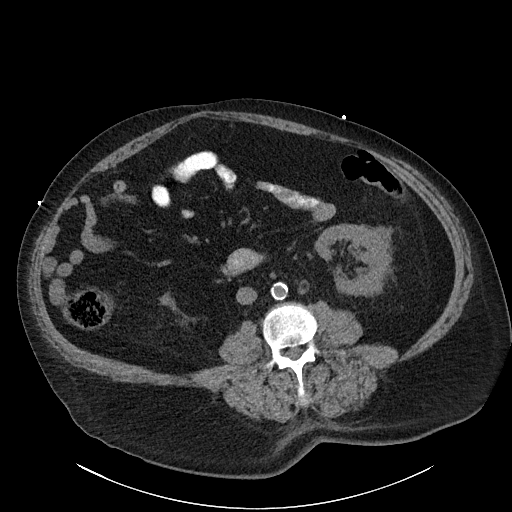
[im 61/104  soft-tissue]
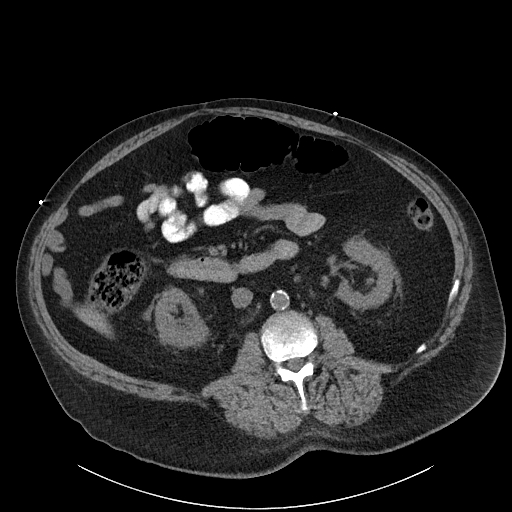
[im 61/104  bone]
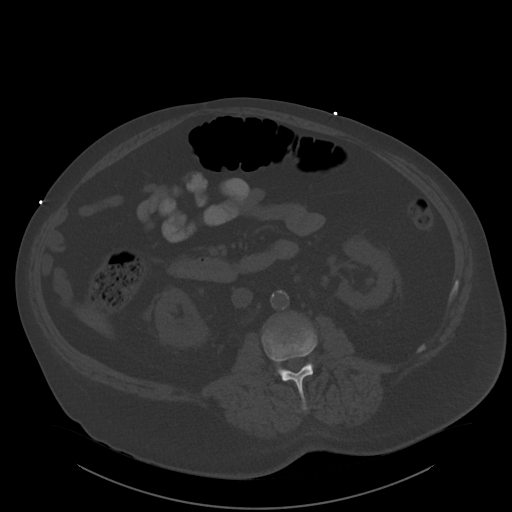
[im 67/104  soft-tissue]
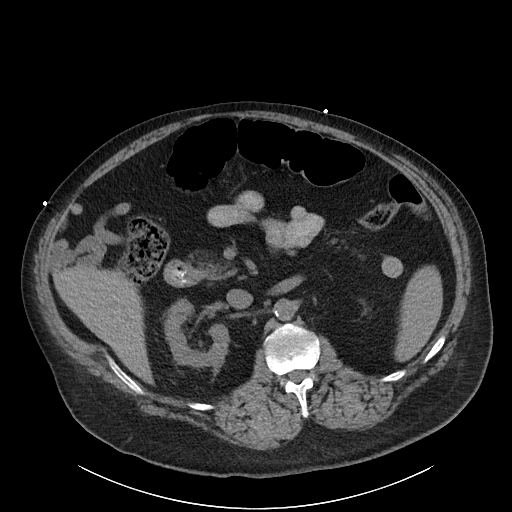
[im 79/104  soft-tissue]
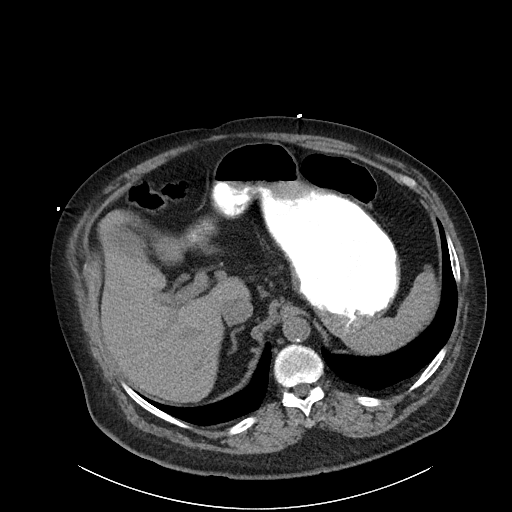
[im 85/104  soft-tissue]
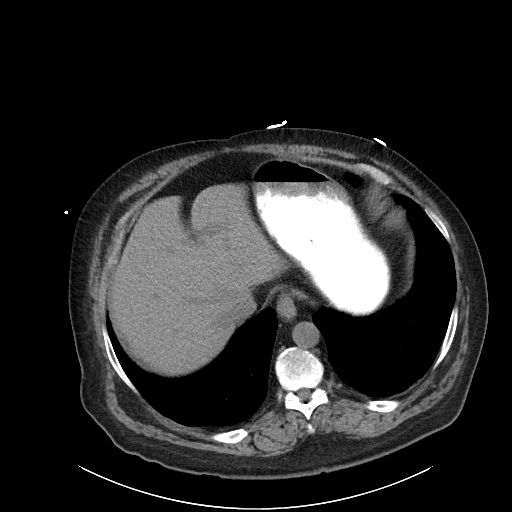
[im 91/104  soft-tissue]
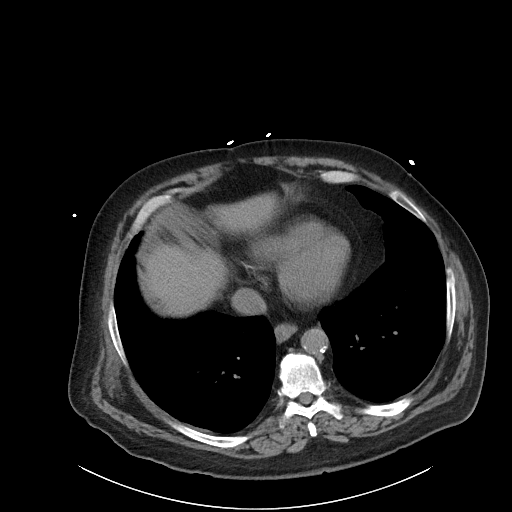
[im 97/104  soft-tissue]
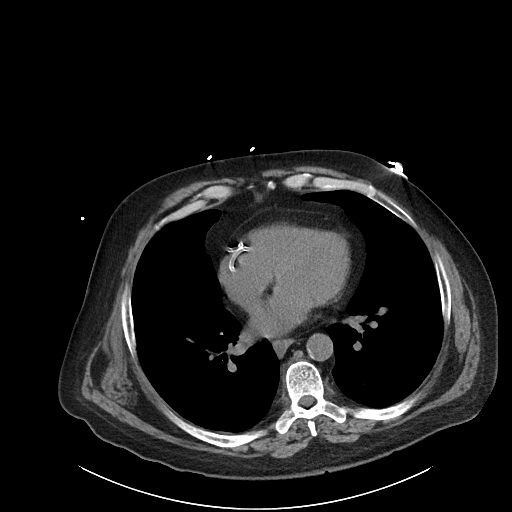

[Series 6: cor · coronal · 1.01mm/px · 3 of 101 slices shown]
[im 34/101  soft-tissue]
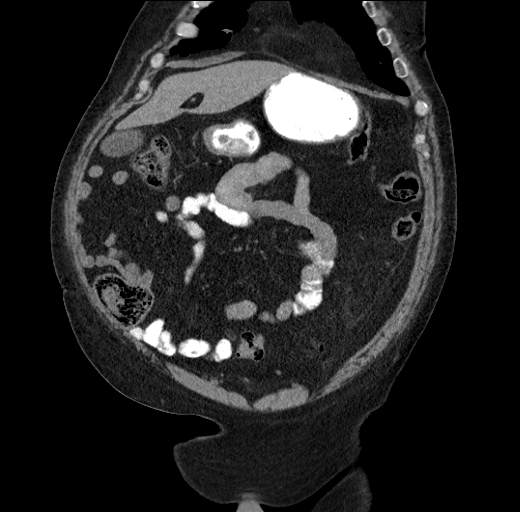
[im 45/101  soft-tissue]
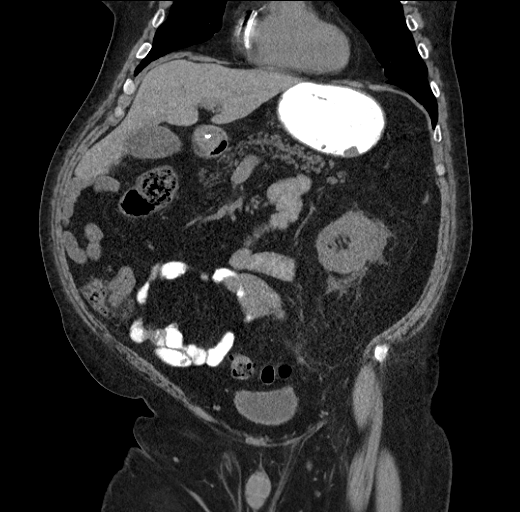
[im 56/101  soft-tissue]
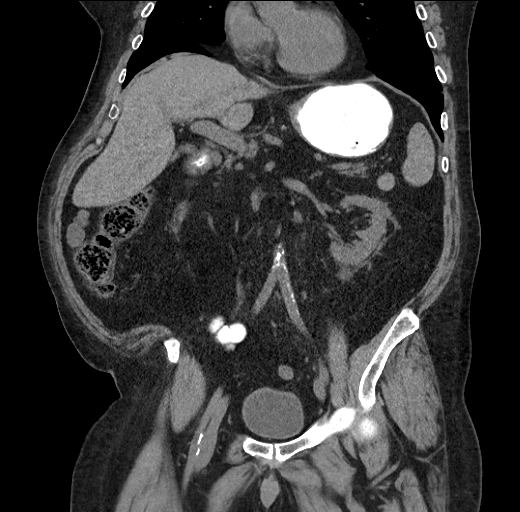

[17 of 46 positions shown; findings below may reference images not displayed]

FINDINGS: Lower chest: No pulmonary nodules or pleural effusion. No visible
pericardial effusion.

Hepatobiliary: Normal hepatic contours and density. No visible
biliary dilatation. Normal gallbladder.

Pancreas: Normal contours without ductal dilatation. No
peripancreatic fluid collection.

Spleen: Normal.

Adrenals/Urinary Tract:

--Adrenal glands: Normal.

--Right kidney/ureter: No hydronephrosis or perinephric stranding.
No nephrolithiasis. No obstructing ureteral stones.

--Left kidney/ureter: There is mild to moderate perinephric and
periureteral stranding. No hydronephrosis. No nephrolithiasis or
obstructing ureteral stone. The degree of stranding is similar to
the prior study. There is a cyst at the left lower pole measuring
1.9 cm.

--Urinary bladder: Unremarkable.

Stomach/Bowel:

--Stomach/Duodenum: No hiatal hernia or other gastric abnormality.
Normal duodenal course and caliber.

--Small bowel: No dilatation or inflammation.

--Colon: No focal abnormality.

--Appendix: Surgically absent.

Vascular/Lymphatic: Atherosclerotic calcification is present within
the non-aneurysmal abdominal aorta, without hemodynamically
significant stenosis. No abdominal or pelvic lymphadenopathy.

Reproductive: Normal prostate and seminal vesicles.

Musculoskeletal. No bony spinal canal stenosis or focal osseous
abnormality.

Other: None.
IMPRESSION: 1. No acute abdominopelvic abnormality.
2. Mild-to-moderate left perinephric and periureteral stranding is
unchanged from prior studies.
3.  Aortic Atherosclerosis ([TS]-[TS]).

## 2017-01-13 MED ORDER — ALLOPURINOL 100 MG PO TABS
100.0000 mg | ORAL_TABLET | Freq: Every day | ORAL | Status: DC
  Administered 2017-01-13 – 2017-01-17 (×5): 100 mg via ORAL
  Filled 2017-01-13 (×5): qty 1

## 2017-01-13 MED ORDER — SODIUM CHLORIDE 0.9 % IV SOLN
2.0000 g | Freq: Once | INTRAVENOUS | Status: AC
  Administered 2017-01-13: 2 g via INTRAVENOUS
  Filled 2017-01-13: qty 20

## 2017-01-13 MED ORDER — TICAGRELOR 90 MG PO TABS
90.0000 mg | ORAL_TABLET | Freq: Two times a day (BID) | ORAL | Status: DC
  Administered 2017-01-13 – 2017-01-17 (×10): 90 mg via ORAL
  Filled 2017-01-13 (×10): qty 1

## 2017-01-13 MED ORDER — ONDANSETRON HCL 4 MG/2ML IJ SOLN: 4.0000 mg | Freq: Four times a day (QID) | INTRAMUSCULAR | Status: DC | PRN

## 2017-01-13 MED ORDER — SODIUM CHLORIDE 0.9 % IV SOLN
INTRAVENOUS | Status: DC
  Administered 2017-01-13 – 2017-01-14 (×3): via INTRAVENOUS

## 2017-01-13 MED ORDER — SODIUM CHLORIDE 0.9 % IV BOLUS (SEPSIS)
1000.0000 mL | Freq: Once | INTRAVENOUS | Status: AC
  Administered 2017-01-13: 1000 mL via INTRAVENOUS

## 2017-01-13 MED ORDER — BARIUM SULFATE 2.1 % PO SUSP
ORAL | Status: AC
  Filled 2017-01-13: qty 2

## 2017-01-13 MED ORDER — ONDANSETRON HCL 4 MG PO TABS
4.0000 mg | ORAL_TABLET | Freq: Four times a day (QID) | ORAL | Status: DC | PRN
  Administered 2017-01-13: 4 mg via ORAL
  Filled 2017-01-13: qty 1

## 2017-01-13 MED ORDER — HYDROXYZINE HCL 25 MG PO TABS
50.0000 mg | ORAL_TABLET | Freq: Three times a day (TID) | ORAL | Status: DC
Start: 2017-01-13 — End: 2017-01-17
  Administered 2017-01-13 – 2017-01-17 (×13): 50 mg via ORAL
  Filled 2017-01-13 (×13): qty 2

## 2017-01-13 MED ORDER — IPRATROPIUM-ALBUTEROL 0.5-2.5 (3) MG/3ML IN SOLN: 3.0000 mL | RESPIRATORY_TRACT | Status: DC | PRN

## 2017-01-13 MED ORDER — ASPIRIN EC 81 MG PO TBEC
81.0000 mg | DELAYED_RELEASE_TABLET | Freq: Every day | ORAL | Status: DC
  Administered 2017-01-13 – 2017-01-17 (×5): 81 mg via ORAL
  Filled 2017-01-13 (×5): qty 1

## 2017-01-13 MED ORDER — SODIUM CHLORIDE 0.9 % IV BOLUS (SEPSIS): 1000.0000 mL | Freq: Once | INTRAVENOUS | Status: DC

## 2017-01-13 MED ORDER — HEPARIN SODIUM (PORCINE) 5000 UNIT/ML IJ SOLN
5000.0000 [IU] | Freq: Three times a day (TID) | INTRAMUSCULAR | Status: DC
  Administered 2017-01-13 – 2017-01-17 (×12): 5000 [IU] via SUBCUTANEOUS
  Filled 2017-01-13 (×12): qty 1

## 2017-01-13 MED ORDER — DEXTROSE 5 % IV SOLN
2.0000 g | INTRAVENOUS | Status: DC
  Administered 2017-01-13 – 2017-01-16 (×4): 2 g via INTRAVENOUS
  Filled 2017-01-13 (×4): qty 2

## 2017-01-13 MED ORDER — DIPHENHYDRAMINE HCL 25 MG PO CAPS
25.0000 mg | ORAL_CAPSULE | Freq: Four times a day (QID) | ORAL | Status: DC | PRN
  Administered 2017-01-16: 25 mg via ORAL
  Filled 2017-01-13: qty 1

## 2017-01-13 MED ORDER — MORPHINE SULFATE (PF) 2 MG/ML IV SOLN
1.0000 mg | INTRAVENOUS | Status: DC | PRN
  Administered 2017-01-14 – 2017-01-15 (×3): 2 mg via INTRAVENOUS
  Administered 2017-01-15: 1 mg via INTRAVENOUS
  Administered 2017-01-15 – 2017-01-16 (×6): 2 mg via INTRAVENOUS
  Administered 2017-01-16: 1 mg via INTRAVENOUS
  Administered 2017-01-17: 2 mg via INTRAVENOUS
  Filled 2017-01-13 (×13): qty 1

## 2017-01-13 MED ORDER — ACETAMINOPHEN 325 MG PO TABS
650.0000 mg | ORAL_TABLET | Freq: Four times a day (QID) | ORAL | Status: DC | PRN
  Administered 2017-01-13 – 2017-01-16 (×7): 650 mg via ORAL
  Filled 2017-01-13 (×9): qty 2

## 2017-01-13 MED ORDER — ALPRAZOLAM 0.5 MG PO TABS
0.5000 mg | ORAL_TABLET | Freq: Three times a day (TID) | ORAL | Status: DC | PRN
  Administered 2017-01-13 – 2017-01-17 (×11): 0.5 mg via ORAL
  Filled 2017-01-13 (×7): qty 1
  Filled 2017-01-13: qty 2
  Filled 2017-01-13 (×3): qty 1

## 2017-01-13 MED ORDER — ROSUVASTATIN CALCIUM 20 MG PO TABS
20.0000 mg | ORAL_TABLET | Freq: Every day | ORAL | Status: DC
  Administered 2017-01-13 – 2017-01-17 (×5): 20 mg via ORAL
  Filled 2017-01-13 (×5): qty 1

## 2017-01-13 MED ORDER — CALCIUM CARBONATE ANTACID 500 MG PO CHEW
500.0000 mg | CHEWABLE_TABLET | Freq: Three times a day (TID) | ORAL | Status: DC | PRN
  Administered 2017-01-15 (×2): 1000 mg via ORAL
  Filled 2017-01-13: qty 5
  Filled 2017-01-13: qty 2
  Filled 2017-01-13: qty 5

## 2017-01-13 MED ORDER — DEXTROSE 5 % IV SOLN
2.0000 g | Freq: Once | INTRAVENOUS | Status: AC
  Administered 2017-01-13: 2 g via INTRAVENOUS
  Filled 2017-01-13: qty 2

## 2017-01-13 NOTE — Progress Notes (Signed)
Attempted report x1. 

## 2017-01-13 NOTE — Progress Notes (Signed)
Pharmacy Antibiotic Note  [name] [name] is a 61 y.o. male admitted on 01/12/2017 with sinusitis.  Pharmacy has been consulted for Ceftriaxone dosing. Presents to ED with low BP and vomiting x 7 days. WBC WNL. Acute renal failure likely due to dehydration.   Plan: Ceftriaxone 2g IV q24h Trend WBC, temp  Height: 5\' 9"  (175.3 cm) Weight: 245 lb (111.1 kg) IBW/kg (Calculated) : 70.7  Temp (24hrs), Avg:98.9 F (37.2 C), Min:98.9 F (37.2 C), Max:98.9 F (37.2 C)  Recent Labs  Lab 01/12/17 1635  WBC 10.0  CREATININE 5.64*    Estimated Creatinine Clearance: 16.9 mL/min (A) (by C-G formula based on SCr of 5.64 mg/dL (H)).    Allergies  Allergen Reactions  . Iohexol Other (See Comments)     Code: HIVES, Desc: PT STATES HE HAD AN IVP 15 YRS AGO AND HAD SOB AND BROKE OUT IN HIVES., Onset Date: 45409811   . Ivp Dye [Iodinated Diagnostic Agents] Other (See Comments)    intolerance   Narda Bonds 01/13/2017 1:09 AM

## 2017-01-13 NOTE — Progress Notes (Signed)
BP is 89/46 MAP is 58. Patient is asymptomatic. Text paged Dr Kennon Holter.

## 2017-01-13 NOTE — ED Notes (Signed)
Attempted report X2. Was told to call back in 5 minutes.

## 2017-01-13 NOTE — Care Management Note (Signed)
Case Management Note  Patient Details  Name: [name] [name] MRN: [id number] Date of Birth: 07/11/1955  Subjective/Objective:       Pt admitted with emesis, ARF             Action/Plan:   PTA independent from home alone.  CM will continue to follow for discharge needs   Expected Discharge Date:                  Expected Discharge Plan:  Home/Self Care  In-House Referral:     Discharge planning Services  CM Consult  Post Acute Care Choice:    Choice offered to:     DME Arranged:    DME Agency:     HH Arranged:    HH Agency:     Status of Service:     If discussed at [employer] of Stay Meetings, dates discussed:    Additional Comments:   Maryclare Labrador, RN 01/13/2017, 2:39 PM

## 2017-01-13 NOTE — ED Notes (Signed)
Pt. To CT via stretcher. 

## 2017-01-13 NOTE — ED Notes (Signed)
Attempted report X1

## 2017-01-13 NOTE — Progress Notes (Signed)
[facility]                                                                              Patient Demographics  [name] [name], is a 61 y.o. male, DOB - 1955/11/03, ZOX:[id number]  Admit date - 01/12/2017   Admitting Physician [name] Morton, MD  Outpatient Primary MD for the patient is [name] Gravel, MD  Outpatient specialists:   LOS - 0  days   Medical records reviewed and are as summarized below:    Chief Complaint  Patient presents with  . Emesis       Brief summary   [name] [name] is a 61 y.o. 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    No hematochezia or melena, diarrhea up to 5 times per day.  Also reported fevers, chills, abdominal cramping, dark urine, dysuria. He recalls taking azithromycin approximately 1 month ago. In ED, BP was in the low 60s-70s, creatinine 5.64, patient was admitted for further workup.    Assessment & Plan    Principal Problem:   ARF (acute renal failure) (HCC) - Baseline creatinine 1-1.1, presented with creatinine of 5.64, BUN 49 - Recent history of nausea, vomiting, diarrhea, also medication effect from lisinopril, Lasix - CT abdomen pelvis showed mild to moderate left perinephric and periureteral stranding -Continue IV fluid hydration, hold lisinopril, Lasix, continue IV Rocephin, creatinine improving, 4.17 today  Active Problems:  Acute pyelonephritis -Follow urine culture, blood cultures, clinical symptoms of UTI -For now continue IV Rocephin  Hypotension: Likely due to severe dehydration, nausea, vomiting, diarrhea, poor appetite, on multiple antihypertensives at home -Improving, continue to hold Cardizem, Lasix, lisinopril, metoprolol -Continue IV fluid hydration, obtain pro-calcitonin, cortisol level    CAD S/P percutaneous coronary  angioplasty -Troponin negative, no chest pain or shortness of breath -Continue aspirin, statin, Brilinta    Dyslipidemia, goal LDL below 70 -Continue statin    Chronic pain -BP currently soft, for now continue morphine IV as needed    Nausea, vomiting, and diarrhea -Currently improving, continue Zofran as needed, send for stool studies, cdiff if diarrhea restarted  Hypocalcemia -Replaced  Anemia -Likely due to hemodilution, obtain FOBT, anemia profile  Obesity -BMI 36.8, recommended diet and weight control   Code Status: Full code DVT Prophylaxis:   heparin Family Communication: Discussed in detail with the patient, all imaging results, lab results explained to the patient    Disposition Plan: Continue monitoring in stepdown today  Time Spent in minutes   35 minutes  Procedures:  CT abdomen  Consultants:   None  Antimicrobials:   IV Rocephin 11/9>   Medications  Scheduled Meds: . allopurinol  100 mg Oral Daily  . aspirin EC  81 mg Oral Daily  . Barium Sulfate      . heparin  5,000 Units Subcutaneous Q8H  . hydrOXYzine  50 mg Oral TID  . rosuvastatin  20 mg Oral Daily  . ticagrelor  90 mg Oral BID   Continuous Infusions: .  sodium chloride 100 mL/hr at 01/13/17 0740  . cefTRIAXone (ROCEPHIN)  IV    . sodium chloride     PRN Meds:.ALPRAZolam, calcium carbonate, diphenhydrAMINE, ipratropium-albuterol, ondansetron **OR** ondansetron (ZOFRAN) IV   Antibiotics   Anti-infectives (From admission, onward)   Start     Dose/Rate Route Frequency Ordered Stop   01/13/17 2200  cefTRIAXone (ROCEPHIN) 2 g in dextrose 5 % 50 mL IVPB     2 g 100 mL/hr over 30 Minutes Intravenous Every 24 hours 01/13/17 0109     01/13/17 0045  cefTRIAXone (ROCEPHIN) 2 g in dextrose 5 % 50 mL IVPB     2 g 100 mL/hr over 30 Minutes Intravenous  Once 01/13/17 0033 01/13/17 0240        Subjective:   [name] [name] was seen and examined today.  States whole body is aching, BP is  slowly improving.  Overall feels weak, no focal weakness.  No fevers or chills.  Currently no vomiting. Patient denies dizziness, chest pain, shortness of breath, abdominal pain,  new weakness, numbess, tingling.  Objective:   Vitals:   01/13/17 0734 01/13/17 0745 01/13/17 0746 01/13/17 0816  BP:   104/63 97/85  Pulse:  82 81   Resp:    20  Temp:    98.5 F (36.9 C)  TempSrc:    Oral  SpO2: 99% 97% 97% 97%  Weight:    113.2 kg (249 lb 9 oz)  Height:    5\' 9"  (1.753 m)    Intake/Output Summary (Last 24 hours) at 01/13/2017 1033 Last data filed at 01/13/2017 0516 Gross per 24 hour  Intake 3050 ml  Output -  Net 3050 ml     Wt Readings from Last 3 Encounters:  01/13/17 113.2 kg (249 lb 9 oz)  11/08/16 116.6 kg (257 lb)  08/05/16 116.1 kg (256 lb)     Exam  General: Alert and oriented x 3, NAD  Eyes: PERRLA, EOMI, Anicteric Sclera,  HEENT:  Atraumatic, normocephalic  Cardiovascular: S1 S2 auscultated, no rubs, murmurs or gallops. Regular rate and rhythm.  Respiratory: Clear to auscultation bilaterally, no wheezing, rales or rhonchi  Gastrointestinal: Soft, mild diffuse tenderness, nondistended, + bowel sounds  Ext: no pedal edema bilaterally  Neuro: AAOx3, Cr N's II- XII. Strength 5/5 upper and lower extremities bilaterally  Musculoskeletal: No digital cyanosis, clubbing  Skin: No rashes  Psych: Normal affect and demeanor, alert and oriented x3    Data Reviewed:  I have personally reviewed following labs and imaging studies  Micro Results Recent Results (from the past 240 hour(s))  MRSA PCR Screening     Status: None   Collection Time: 01/13/17  8:30 AM  Result Value Ref Range Status   MRSA by PCR NEGATIVE NEGATIVE Final    Comment:        The GeneXpert MRSA Assay (FDA approved for NASAL specimens only), is one component of a comprehensive MRSA colonization surveillance program. It is not intended to diagnose MRSA infection nor to guide or monitor  treatment for MRSA infections.   Rapid strep screen (not at [facility])     Status: None   Collection Time: 01/13/17  9:09 AM  Result Value Ref Range Status   Streptococcus, Group A Screen (Direct) NEGATIVE NEGATIVE Final    Comment: (NOTE) A Rapid Antigen test may result negative if the antigen level in the sample is below the detection level of this test. The FDA has not cleared this test as a stand-alone test  therefore the rapid antigen negative result has reflexed to a Group A Strep culture.     Radiology Reports Ct Abdomen Pelvis Wo Contrast  Result Date: 01/13/2017 CLINICAL DATA:  Nausea, vomiting and diarrhea EXAM: CT ABDOMEN AND PELVIS WITHOUT CONTRAST TECHNIQUE: Multidetector CT imaging of the abdomen and pelvis was performed following the standard protocol without IV contrast. COMPARISON:  CT abdomen pelvis 08/11/2014 FINDINGS: Lower chest: No pulmonary nodules or pleural effusion. No visible pericardial effusion. Hepatobiliary: Normal hepatic contours and density. No visible biliary dilatation. Normal gallbladder. Pancreas: Normal contours without ductal dilatation. No peripancreatic fluid collection. Spleen: Normal. Adrenals/Urinary Tract: --Adrenal glands: Normal. --Right kidney/ureter: No hydronephrosis or perinephric stranding. No nephrolithiasis. No obstructing ureteral stones. --Left kidney/ureter: There is mild to moderate perinephric and periureteral stranding. No hydronephrosis. No nephrolithiasis or obstructing ureteral stone. The degree of stranding is similar to the prior study. There is a cyst at the left lower pole measuring 1.9 cm. --Urinary bladder: Unremarkable. Stomach/Bowel: --Stomach/Duodenum: No hiatal hernia or other gastric abnormality. Normal duodenal course and caliber. --Small bowel: No dilatation or inflammation. --Colon: No focal abnormality. --Appendix: Surgically absent. Vascular/Lymphatic: Atherosclerotic calcification is present within the non-aneurysmal  abdominal aorta, without hemodynamically significant stenosis. No abdominal or pelvic lymphadenopathy. Reproductive: Normal prostate and seminal vesicles. Musculoskeletal. No bony spinal canal stenosis or focal osseous abnormality. Other: None. IMPRESSION: 1. No acute abdominopelvic abnormality. 2. Mild-to-moderate left perinephric and periureteral stranding is unchanged from prior studies. 3.  Aortic Atherosclerosis (ICD10-I70.0). Electronically Signed   By: Ulyses Jarred M.D.   On: 01/13/2017 04:59    Lab Data:  CBC: Recent Labs  Lab 01/12/17 1635 01/13/17 0502  WBC 10.0 5.8  HGB 11.3* 8.0*  HCT 33.9* 24.6*  MCV 89.9 90.4  PLT 258 657   Basic Metabolic Panel: Recent Labs  Lab 01/12/17 1635 01/13/17 0502  NA 133* 136  K 4.8 4.0  CL 102 113*  CO2 18* 15*  GLUCOSE 103* 78  BUN 49* 41*  CREATININE 5.64* 4.17*  CALCIUM 8.9 6.5*   GFR: Estimated Creatinine Clearance: 23.1 mL/min (A) (by C-G formula based on SCr of 4.17 mg/dL (H)). Liver Function Tests: Recent Labs  Lab 01/12/17 1635  AST 19  ALT 21  ALKPHOS 73  BILITOT 0.7  PROT 7.3  ALBUMIN 4.4   Recent Labs  Lab 01/12/17 1635  LIPASE 20   No results for input(s): AMMONIA in the last 168 hours. Coagulation Profile: No results for input(s): INR, PROTIME in the last 168 hours. Cardiac Enzymes: Recent Labs  Lab 01/13/17 0301  TROPONINI <0.03   BNP (last 3 results) No results for input(s): PROBNP in the last 8760 hours. HbA1C: No results for input(s): HGBA1C in the last 72 hours. CBG: No results for input(s): GLUCAP in the last 168 hours. Lipid Profile: No results for input(s): CHOL, HDL, LDLCALC, TRIG, CHOLHDL, LDLDIRECT in the last 72 hours. Thyroid Function Tests: Recent Labs    01/13/17 0302  TSH 0.887   Anemia Panel: No results for input(s): VITAMINB12, FOLATE, FERRITIN, TIBC, IRON, RETICCTPCT in the last 72 hours. Urine analysis:    Component Value Date/Time   COLORURINE YELLOW 01/13/2017  [place] 01/13/2017 0956   LABSPEC 1.006 01/13/2017 0956   PHURINE 5.0 01/13/2017 0956   GLUCOSEU NEGATIVE 01/13/2017 0956   HGBUR MODERATE (A) 01/13/2017 0956   BILIRUBINUR NEGATIVE 01/13/2017 [place] 01/13/2017 0956   PROTEINUR NEGATIVE 01/13/2017 0956   NITRITE NEGATIVE 01/13/2017 9038  LEUKOCYTESUR NEGATIVE 01/13/2017 0956     Armend Hochstatter M.D. [facility] 01/13/2017, 10:33 AM  Pager: [phone] Between 7am to 7pm - call Pager - [phone]  After 7pm go to [email or web address] - password [email or web address]  Call night coverage person covering after 7pm

## 2017-01-14 LAB — BASIC METABOLIC PANEL
Anion gap: 5 (ref 5–15)
BUN: 37 mg/dL — ABNORMAL HIGH (ref 6–20)
CALCIUM: 8.5 mg/dL — AB (ref 8.9–10.3)
CO2: 20 mmol/L — AB (ref 22–32)
CREATININE: 2.79 mg/dL — AB (ref 0.61–1.24)
Chloride: 114 mmol/L — ABNORMAL HIGH (ref 101–111)
GFR calc non Af Amer: 23 mL/min — ABNORMAL LOW (ref >60)
GFR, EST AFRICAN AMERICAN: 27 mL/min — AB (ref >60)
Glucose, Bld: 100 mg/dL — ABNORMAL HIGH (ref 65–99)
Potassium: 4.9 mmol/L (ref 3.5–5.1)
SODIUM: 139 mmol/L (ref 135–145)

## 2017-01-14 LAB — CBC
HCT: 27.8 % — ABNORMAL LOW (ref 39.0–52.0)
HEMOGLOBIN: 9.2 g/dL — AB (ref 13.0–17.0)
MCH: 30.4 pg (ref 26.0–34.0)
MCHC: 33.1 g/dL (ref 30.0–36.0)
MCV: 91.7 fL (ref 78.0–100.0)
Platelets: 194 10*3/uL (ref 150–400)
RBC: 3.03 MIL/uL — AB (ref 4.22–5.81)
RDW: 13.3 % (ref 11.5–15.5)
WBC: 4.2 10*3/uL (ref 4.0–10.5)

## 2017-01-14 LAB — URINE CULTURE: CULTURE: NO GROWTH

## 2017-01-14 MED ORDER — HYDROCORTISONE NA SUCCINATE PF 100 MG IJ SOLR
50.0000 mg | Freq: Three times a day (TID) | INTRAMUSCULAR | Status: DC
  Administered 2017-01-14 – 2017-01-15 (×3): 50 mg via INTRAVENOUS
  Filled 2017-01-14 (×3): qty 2

## 2017-01-14 MED ORDER — HYDROCORTISONE NA SUCCINATE PF 100 MG IJ SOLR
100.0000 mg | Freq: Once | INTRAMUSCULAR | Status: AC
  Administered 2017-01-14: 100 mg via INTRAVENOUS
  Filled 2017-01-14: qty 2

## 2017-01-14 NOTE — Progress Notes (Signed)
[facility]                                                                              Patient Demographics  [name] [name], is a 61 y.o. male, DOB - 29-Mar-1955, CWC:[id number]  Admit date - 01/12/2017   Admitting Physician Norval Morton, MD  Outpatient Primary MD for the patient is Jani Gravel, MD  Outpatient specialists:   LOS - 1  days   Medical records reviewed and are as summarized below:    Chief Complaint  Patient presents with  . Emesis       Brief summary   [name] [name] is a 61 y.o. 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    No hematochezia or melena, diarrhea up to 5 times per day.  Also reported fevers, chills, abdominal cramping, dark urine, dysuria. He recalls taking azithromycin approximately 1 month ago. In ED, BP was in the low 60s-70s, creatinine 5.64, patient was admitted for further workup.    Assessment & Plan    Principal Problem:   ARF (acute renal failure) (HCC) - Baseline creatinine 1-1.1, presented with creatinine of 5.64, BUN 49 - Recent history of nausea, vomiting, diarrhea, also medication effect from lisinopril, Lasix - CT abdomen pelvis showed mild to moderate left perinephric and periureteral stranding -Continue IV fluid hydration, continue to hold lisinopril, Lasix.   -Creatinine improving, 2.79 today   Active Problems:  Acute pyelonephritis -Follow urine cultures and sensitivities, blood cultures -For now continue IV Rocephin  Hypotension: Likely due to severe dehydration, nausea, vomiting, diarrhea, poor appetite, on multiple antihypertensives at home -BP still borderline, continue to hold Cardizem, Lasix, metoprolol, lisinopril -Pro calcitonin 0.12, cortisol level 4.1 - will place on hydrocortisone 100 x1 and then hydrocortisone 50 mg every 8  hours    CAD S/P percutaneous coronary angioplasty -Troponin negative, no chest pain or shortness of breath -Continue aspirin, statin, Brilinta    Dyslipidemia, goal LDL below 70 -Continue statin    Chronic pain -BP currently soft, for now continue morphine IV as needed    Nausea, vomiting, and diarrhea -Currently improving, no frank diarrhea  Hypocalcemia -Resolved  Anemia -Likely due to hemodilution, patient is stable today  Obesity -BMI 36.8, recommended diet and weight control   Code Status: Full code DVT Prophylaxis:   heparin Family Communication: Discussed in detail with the patient, all imaging results, lab results explained to the patient    Disposition Plan: Continue monitoring in stepdown today  Time Spent in minutes   25 minutes  Procedures:  CT abdomen  Consultants:   None  Antimicrobials:   IV Rocephin 11/9>   Medications  Scheduled Meds: . allopurinol  100 mg Oral Daily  . aspirin EC  81 mg Oral Daily  . heparin  5,000 Units Subcutaneous Q8H  . hydrocortisone sod succinate (SOLU-CORTEF) inj  100 mg Intravenous Once  . hydrocortisone sod succinate (SOLU-CORTEF) inj  50 mg Intravenous Q8H  . hydrOXYzine  50 mg Oral  TID  . rosuvastatin  20 mg Oral Daily  . ticagrelor  90 mg Oral BID   Continuous Infusions: . sodium chloride 125 mL/hr at 01/14/17 0700  . cefTRIAXone (ROCEPHIN)  IV Stopped (01/13/17 2310)  . sodium chloride     PRN Meds:.acetaminophen, ALPRAZolam, calcium carbonate, diphenhydrAMINE, ipratropium-albuterol, morphine injection, ondansetron **OR** ondansetron (ZOFRAN) IV   Antibiotics   Anti-infectives (From admission, onward)   Start     Dose/Rate Route Frequency Ordered Stop   01/13/17 2200  cefTRIAXone (ROCEPHIN) 2 g in dextrose 5 % 50 mL IVPB     2 g 100 mL/hr over 30 Minutes Intravenous Every 24 hours 01/13/17 0109     01/13/17 0045  cefTRIAXone (ROCEPHIN) 2 g in dextrose 5 % 50 mL IVPB     2 g 100 mL/hr over 30  Minutes Intravenous  Once 01/13/17 0033 01/13/17 0240        Subjective:   [name] [name] was seen and examined today.  Upset that he cannot use the bathroom, not on bedrest.  Overall is feeling somewhat better today.  No fevers or chills.  Currently no vomiting, no nausea. Patient denies dizziness, chest pain, shortness of breath, abdominal pain,  new weakness, numbess, tingling.  Objective:   Vitals:   01/13/17 2044 01/13/17 2314 01/14/17 0437 01/14/17 0849  BP: (!) 89/46 (!) 104/58 103/78 (!) 96/56  Pulse: 77 71 68 61  Resp: 13 13 13 12   Temp: 97.7 F (36.5 C) 97.7 F (36.5 C) 98 F (36.7 C) 98.4 F (36.9 C)  TempSrc: Axillary Axillary Axillary Oral  SpO2: 93% 96% 99% 91%  Weight:      Height:        Intake/Output Summary (Last 24 hours) at 01/14/2017 1025 Last data filed at 01/14/2017 0700 Gross per 24 hour  Intake 2335.83 ml  Output 2950 ml  Net -614.17 ml     Wt Readings from Last 3 Encounters:  01/13/17 113.2 kg (249 lb 9 oz)  11/08/16 116.6 kg (257 lb)  08/05/16 116.1 kg (256 lb)     Exam General: Alert and oriented x 3, NAD Eyes:  HEENT:   Cardiovascular: S1 S2 clear, RRR No pedal edema b/l Respiratory: dec BS at bases  Gastrointestinal: Soft, nontender, nondistended, + bowel sounds Ext: no pedal edema bilaterally Neuro: no new deficit  musculoskeletal: No digital cyanosis, clubbing Skin: No rashes Psych: Normal affect and demeanor, alert and oriented x3      Data Reviewed:  I have personally reviewed following labs and imaging studies  Micro Results Recent Results (from the past 240 hour(s))  MRSA PCR Screening     Status: None   Collection Time: 01/13/17  8:30 AM  Result Value Ref Range Status   MRSA by PCR NEGATIVE NEGATIVE Final    Comment:        The GeneXpert MRSA Assay (FDA approved for NASAL specimens only), is one component of a comprehensive MRSA colonization surveillance program. It is not intended to diagnose MRSA infection  nor to guide or monitor treatment for MRSA infections.   Rapid strep screen (not at [facility])     Status: None   Collection Time: 01/13/17  9:09 AM  Result Value Ref Range Status   Streptococcus, Group A Screen (Direct) NEGATIVE NEGATIVE Final    Comment: (NOTE) A Rapid Antigen test may result negative if the antigen level in the sample is below the detection level of this test. The FDA has not cleared this test as  a stand-alone test therefore the rapid antigen negative result has reflexed to a Group A Strep culture.     Radiology Reports Ct Abdomen Pelvis Wo Contrast  Result Date: 01/13/2017 CLINICAL DATA:  Nausea, vomiting and diarrhea EXAM: CT ABDOMEN AND PELVIS WITHOUT CONTRAST TECHNIQUE: Multidetector CT imaging of the abdomen and pelvis was performed following the standard protocol without IV contrast. COMPARISON:  CT abdomen pelvis 08/11/2014 FINDINGS: Lower chest: No pulmonary nodules or pleural effusion. No visible pericardial effusion. Hepatobiliary: Normal hepatic contours and density. No visible biliary dilatation. Normal gallbladder. Pancreas: Normal contours without ductal dilatation. No peripancreatic fluid collection. Spleen: Normal. Adrenals/Urinary Tract: --Adrenal glands: Normal. --Right kidney/ureter: No hydronephrosis or perinephric stranding. No nephrolithiasis. No obstructing ureteral stones. --Left kidney/ureter: There is mild to moderate perinephric and periureteral stranding. No hydronephrosis. No nephrolithiasis or obstructing ureteral stone. The degree of stranding is similar to the prior study. There is a cyst at the left lower pole measuring 1.9 cm. --Urinary bladder: Unremarkable. Stomach/Bowel: --Stomach/Duodenum: No hiatal hernia or other gastric abnormality. Normal duodenal course and caliber. --Small bowel: No dilatation or inflammation. --Colon: No focal abnormality. --Appendix: Surgically absent. Vascular/Lymphatic: Atherosclerotic calcification is present within  the non-aneurysmal abdominal aorta, without hemodynamically significant stenosis. No abdominal or pelvic lymphadenopathy. Reproductive: Normal prostate and seminal vesicles. Musculoskeletal. No bony spinal canal stenosis or focal osseous abnormality. Other: None. IMPRESSION: 1. No acute abdominopelvic abnormality. 2. Mild-to-moderate left perinephric and periureteral stranding is unchanged from prior studies. 3.  Aortic Atherosclerosis (ICD10-I70.0). Electronically Signed   By: Ulyses Jarred M.D.   On: 01/13/2017 04:59    Lab Data:  CBC: Recent Labs  Lab 01/12/17 1635 01/13/17 0502 01/14/17 0316  WBC 10.0 5.8 4.2  HGB 11.3* 8.0* 9.2*  HCT 33.9* 24.6* 27.8*  MCV 89.9 90.4 91.7  PLT 258 169 378   Basic Metabolic Panel: Recent Labs  Lab 01/12/17 1635 01/13/17 0502 01/14/17 0316  NA 133* 136 139  K 4.8 4.0 4.9  CL 102 113* 114*  CO2 18* 15* 20*  GLUCOSE 103* 78 100*  BUN 49* 41* 37*  CREATININE 5.64* 4.17* 2.79*  CALCIUM 8.9 6.5* 8.5*   GFR: Estimated Creatinine Clearance: 34.5 mL/min (A) (by C-G formula based on SCr of 2.79 mg/dL (H)). Liver Function Tests: Recent Labs  Lab 01/12/17 1635  AST 19  ALT 21  ALKPHOS 73  BILITOT 0.7  PROT 7.3  ALBUMIN 4.4   Recent Labs  Lab 01/12/17 1635  LIPASE 20   No results for input(s): AMMONIA in the last 168 hours. Coagulation Profile: No results for input(s): INR, PROTIME in the last 168 hours. Cardiac Enzymes: Recent Labs  Lab 01/13/17 0301  TROPONINI <0.03   BNP (last 3 results) No results for input(s): PROBNP in the last 8760 hours. HbA1C: No results for input(s): HGBA1C in the last 72 hours. CBG: No results for input(s): GLUCAP in the last 168 hours. Lipid Profile: No results for input(s): CHOL, HDL, LDLCALC, TRIG, CHOLHDL, LDLDIRECT in the last 72 hours. Thyroid Function Tests: Recent Labs    01/13/17 0302  TSH 0.887   Anemia Panel: Recent Labs    01/13/17 1103  VITAMINB12 181  FOLATE 7.9  FERRITIN  147  TIBC 259  IRON 104  RETICCTPCT 1.0   Urine analysis:    Component Value Date/Time   COLORURINE YELLOW 01/13/2017 [place] 01/13/2017 0956   LABSPEC 1.006 01/13/2017 0956   PHURINE 5.0 01/13/2017 0956   GLUCOSEU NEGATIVE 01/13/2017 5885  HGBUR MODERATE (A) 01/13/2017 0956   BILIRUBINUR NEGATIVE 01/13/2017 0956   KETONESUR NEGATIVE 01/13/2017 0956   PROTEINUR NEGATIVE 01/13/2017 0956   NITRITE NEGATIVE 01/13/2017 0956   LEUKOCYTESUR NEGATIVE 01/13/2017 0956     Itali Mckendry M.D. [facility] 01/14/2017, 10:25 AM  Pager: [phone] Between 7am to 7pm - call Pager - [phone]  After 7pm go to [email or web address] - password [email or web address]  Call night coverage person covering after 7pm

## 2017-01-15 LAB — BASIC METABOLIC PANEL
Anion gap: 8 (ref 5–15)
BUN: 21 mg/dL — AB (ref 6–20)
CALCIUM: 9.1 mg/dL (ref 8.9–10.3)
CO2: 18 mmol/L — ABNORMAL LOW (ref 22–32)
Chloride: 115 mmol/L — ABNORMAL HIGH (ref 101–111)
Creatinine, Ser: 1.6 mg/dL — ABNORMAL HIGH (ref 0.61–1.24)
GFR calc Af Amer: 52 mL/min — ABNORMAL LOW (ref >60)
GFR, EST NON AFRICAN AMERICAN: 45 mL/min — AB (ref >60)
GLUCOSE: 126 mg/dL — AB (ref 65–99)
POTASSIUM: 4.3 mmol/L (ref 3.5–5.1)
SODIUM: 141 mmol/L (ref 135–145)

## 2017-01-15 LAB — CULTURE, GROUP A STREP (THRC)

## 2017-01-15 LAB — OCCULT BLOOD X 1 CARD TO LAB, STOOL: FECAL OCCULT BLD: NEGATIVE

## 2017-01-15 MED ORDER — HYDROCORTISONE NA SUCCINATE PF 100 MG IJ SOLR
50.0000 mg | Freq: Two times a day (BID) | INTRAMUSCULAR | Status: DC
  Administered 2017-01-15: 50 mg via INTRAVENOUS
  Filled 2017-01-15: qty 2

## 2017-01-15 NOTE — Progress Notes (Addendum)
Pt presented today with increasing anxietyand restlessness. Intermittent emotional tearfullness, expressing worries about cleaning his home and coping. Xanax prn and Visteril TID  given per order. RN ambulated with pt to reduce restlessness. Poor stamina and unwilling to sit in chair. Safety maintained throughout shift. Pt had bathroom urgency , with 3 stools in AM. Pt states he has restless legs and unable to sleep. Will continue to monitor and provide a safe environment.

## 2017-01-15 NOTE — Progress Notes (Signed)
[facility]                                                                              Patient Demographics  [name] [name], is a 61 y.o. male, DOB - Jan 20, 1956, WTU:[id number]  Admit date - 01/12/2017   Admitting Physician Norval Morton, MD  Outpatient Primary MD for the patient is Jani Gravel, MD  Outpatient specialists:   LOS - 2  days   Medical records reviewed and are as summarized below:    Chief Complaint  Patient presents with  . Emesis       Brief summary   [name] [name] is a 61 y.o. 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    No hematochezia or melena, diarrhea up to 5 times per day.  Also reported fevers, chills, abdominal cramping, dark urine, dysuria. He recalls taking azithromycin approximately 1 month ago. In ED, BP was in the low 60s-70s, creatinine 5.64, patient was admitted for further workup.    Assessment & Plan    Principal Problem:   ARF (acute renal failure) (HCC) - Baseline creatinine 1-1.1, presented with creatinine of 5.64, BUN 49 - Recent history of nausea, vomiting, diarrhea, also medication effect from lisinopril, Lasix - CT abdomen pelvis showed mild to moderate left perinephric and periureteral stranding - BMET pending this morning - Continue to hold Lasix, lisinopril -Decrease IV fluids, creatinine now improving  Active Problems:  Acute pyelonephritis -Blood cultures negative so far, urine cultures and sensitivity.   -Continue IV Rocephin  Hypotension: Likely due to severe dehydration, nausea, vomiting, diarrhea, poor appetite, on multiple antihypertensives at home -BP improving, for now continue to hold Cardizem, Lasix, metoprolol, lisinopril.   -Pro calcitonin 0.12, cortisol level 4.1 -On stress dose steroids, taper down    CAD S/P  percutaneous coronary angioplasty -Troponin negative, no chest pain or shortness of breath -Continue aspirin, statin, Brilinta    Dyslipidemia, goal LDL below 70 -Continue statin    Chronic pain -Continue pain medication as needed    Nausea, vomiting, and diarrhea -Currently improving, no frank diarrhea -Encouraged oral diet  Hypocalcemia -Resolved, bmet pending  Anemia -Likely due to hemodilution, patient is stable today  Obesity -BMI 36.8, recommended diet and weight control   Code Status: Full code DVT Prophylaxis:   heparin Family Communication: Discussed in detail with the patient, all imaging results, lab results explained to the patient    Disposition Plan: will transfer to telemetry today   Time Spent in minutes   25 minutes  Procedures:  CT abdomen  Consultants:   None  Antimicrobials:   IV Rocephin 11/9>   Medications  Scheduled Meds: . allopurinol  100 mg Oral Daily  . aspirin EC  81 mg Oral Daily  . heparin  5,000 Units Subcutaneous Q8H  . hydrocortisone sod succinate (SOLU-CORTEF) inj  50 mg Intravenous Q12H  . hydrOXYzine  50 mg Oral TID  . rosuvastatin  20 mg Oral Daily  . ticagrelor  90  mg Oral BID   Continuous Infusions: . sodium chloride 75 mL/hr at 01/15/17 0949  . cefTRIAXone (ROCEPHIN)  IV Stopped (01/14/17 2235)  . sodium chloride     PRN Meds:.acetaminophen, ALPRAZolam, calcium carbonate, diphenhydrAMINE, ipratropium-albuterol, morphine injection, ondansetron **OR** ondansetron (ZOFRAN) IV   Antibiotics   Anti-infectives (From admission, onward)   Start     Dose/Rate Route Frequency Ordered Stop   01/13/17 2200  cefTRIAXone (ROCEPHIN) 2 g in dextrose 5 % 50 mL IVPB     2 g 100 mL/hr over 30 Minutes Intravenous Every 24 hours 01/13/17 0109     01/13/17 0045  cefTRIAXone (ROCEPHIN) 2 g in dextrose 5 % 50 mL IVPB     2 g 100 mL/hr over 30 Minutes Intravenous  Once 01/13/17 0033 01/13/17 0240        Subjective:    [name] [name] was seen and examined today.  Feeling better today.  Still feels deconditioned, has not worked with PT yet.  No nausea, vomiting or diarrhea BP improving.  States he is not eating well still.  Does not like the food.  Patient denies dizziness, chest pain, shortness of breath, abdominal pain,  new weakness, numbess, tingling.  Objective:   Vitals:   01/14/17 1937 01/15/17 0012 01/15/17 0451 01/15/17 0739  BP: 107/78 (!) 102/91 (!) 117/57 (!) 121/93  Pulse: 90 98 82 71  Resp: 15 16 12 16   Temp: 98.1 F (36.7 C) 98.6 F (37 C) 99 F (37.2 C) 98.8 F (37.1 C)  TempSrc: Oral Oral Oral Oral  SpO2: 94% 96% 95% 98%  Weight:      Height:        Intake/Output Summary (Last 24 hours) at 01/15/2017 1013 Last data filed at 01/15/2017 4403 Gross per 24 hour  Intake 3250 ml  Output 1275 ml  Net 1975 ml     Wt Readings from Last 3 Encounters:  01/13/17 113.2 kg (249 lb 9 oz)  11/08/16 116.6 kg (257 lb)  08/05/16 116.1 kg (256 lb)     Exam   General: Alert and oriented x 3, NAD  Eyes:  HEENT:    Cardiovascular: S1 S2 auscultated, RRR  Respiratory: Clear to auscultation bilaterally, no wheezing, rales or rhonchi  Gastrointestinal: Soft, nontender, nondistended, + bowel sounds  Ext: no pedal edema bilaterally  Neuro: no new deficits  Musculoskeletal: No digital cyanosis, clubbing  Skin: No rashes  Psych: Normal affect and demeanor, alert and oriented x3    Data Reviewed:  I have personally reviewed following labs and imaging studies  Micro Results Recent Results (from the past 240 hour(s))  MRSA PCR Screening     Status: None   Collection Time: 01/13/17  8:30 AM  Result Value Ref Range Status   MRSA by PCR NEGATIVE NEGATIVE Final    Comment:        The GeneXpert MRSA Assay (FDA approved for NASAL specimens only), is one component of a comprehensive MRSA colonization surveillance program. It is not intended to diagnose MRSA infection nor to  guide or monitor treatment for MRSA infections.   Rapid strep screen (not at [facility])     Status: None   Collection Time: 01/13/17  9:09 AM  Result Value Ref Range Status   Streptococcus, Group A Screen (Direct) NEGATIVE NEGATIVE Final    Comment: (NOTE) A Rapid Antigen test may result negative if the antigen level in the sample is below the detection level of this test. The FDA has not cleared this  test as a stand-alone test therefore the rapid antigen negative result has reflexed to a Group A Strep culture.   Culture, group A strep     Status: None (Preliminary result)   Collection Time: 01/13/17  9:09 AM  Result Value Ref Range Status   Specimen Description THROAT  Final   Special Requests NONE Reflexed from [id number]  Final   Culture CULTURE REINCUBATED FOR BETTER GROWTH  Final   Report Status PENDING  Incomplete  Urine Culture     Status: None   Collection Time: 01/13/17  9:40 AM  Result Value Ref Range Status   Specimen Description URINE, RANDOM  Final   Special Requests NONE  Final   Culture NO GROWTH  Final   Report Status 01/14/2017 FINAL  Final  Culture, blood (routine x 2)     Status: None (Preliminary result)   Collection Time: 01/14/17  6:30 AM  Result Value Ref Range Status   Specimen Description BLOOD RIGHT ARM  Final   Special Requests IN PEDIATRIC BOTTLE Blood Culture adequate volume  Final   Culture NO GROWTH 1 DAY  Final   Report Status PENDING  Incomplete  Culture, blood (routine x 2)     Status: None (Preliminary result)   Collection Time: 01/14/17  6:38 AM  Result Value Ref Range Status   Specimen Description BLOOD RIGHT HAND  Final   Special Requests IN PEDIATRIC BOTTLE Blood Culture adequate volume  Final   Culture NO GROWTH 1 DAY  Final   Report Status PENDING  Incomplete    Radiology Reports Ct Abdomen Pelvis Wo Contrast  Result Date: 01/13/2017 CLINICAL DATA:  Nausea, vomiting and diarrhea EXAM: CT ABDOMEN AND PELVIS WITHOUT CONTRAST TECHNIQUE:  Multidetector CT imaging of the abdomen and pelvis was performed following the standard protocol without IV contrast. COMPARISON:  CT abdomen pelvis 08/11/2014 FINDINGS: Lower chest: No pulmonary nodules or pleural effusion. No visible pericardial effusion. Hepatobiliary: Normal hepatic contours and density. No visible biliary dilatation. Normal gallbladder. Pancreas: Normal contours without ductal dilatation. No peripancreatic fluid collection. Spleen: Normal. Adrenals/Urinary Tract: --Adrenal glands: Normal. --Right kidney/ureter: No hydronephrosis or perinephric stranding. No nephrolithiasis. No obstructing ureteral stones. --Left kidney/ureter: There is mild to moderate perinephric and periureteral stranding. No hydronephrosis. No nephrolithiasis or obstructing ureteral stone. The degree of stranding is similar to the prior study. There is a cyst at the left lower pole measuring 1.9 cm. --Urinary bladder: Unremarkable. Stomach/Bowel: --Stomach/Duodenum: No hiatal hernia or other gastric abnormality. Normal duodenal course and caliber. --Small bowel: No dilatation or inflammation. --Colon: No focal abnormality. --Appendix: Surgically absent. Vascular/Lymphatic: Atherosclerotic calcification is present within the non-aneurysmal abdominal aorta, without hemodynamically significant stenosis. No abdominal or pelvic lymphadenopathy. Reproductive: Normal prostate and seminal vesicles. Musculoskeletal. No bony spinal canal stenosis or focal osseous abnormality. Other: None. IMPRESSION: 1. No acute abdominopelvic abnormality. 2. Mild-to-moderate left perinephric and periureteral stranding is unchanged from prior studies. 3.  Aortic Atherosclerosis (ICD10-I70.0). Electronically Signed   By: Ulyses Jarred M.D.   On: 01/13/2017 04:59    Lab Data:  CBC: Recent Labs  Lab 01/12/17 1635 01/13/17 0502 01/14/17 0316  WBC 10.0 5.8 4.2  HGB 11.3* 8.0* 9.2*  HCT 33.9* 24.6* 27.8*  MCV 89.9 90.4 91.7  PLT 258 169 194     Basic Metabolic Panel: Recent Labs  Lab 01/12/17 1635 01/13/17 0502 01/14/17 0316  NA 133* 136 139  K 4.8 4.0 4.9  CL 102 113* 114*  CO2 18* 15* 20*  GLUCOSE  103* 78 100*  BUN 49* 41* 37*  CREATININE 5.64* 4.17* 2.79*  CALCIUM 8.9 6.5* 8.5*   GFR: Estimated Creatinine Clearance: 34.5 mL/min (A) (by C-G formula based on SCr of 2.79 mg/dL (H)). Liver Function Tests: Recent Labs  Lab 01/12/17 1635  AST 19  ALT 21  ALKPHOS 73  BILITOT 0.7  PROT 7.3  ALBUMIN 4.4   Recent Labs  Lab 01/12/17 1635  LIPASE 20   No results for input(s): AMMONIA in the last 168 hours. Coagulation Profile: No results for input(s): INR, PROTIME in the last 168 hours. Cardiac Enzymes: Recent Labs  Lab 01/13/17 0301  TROPONINI <0.03   BNP (last 3 results) No results for input(s): PROBNP in the last 8760 hours. HbA1C: No results for input(s): HGBA1C in the last 72 hours. CBG: No results for input(s): GLUCAP in the last 168 hours. Lipid Profile: No results for input(s): CHOL, HDL, LDLCALC, TRIG, CHOLHDL, LDLDIRECT in the last 72 hours. Thyroid Function Tests: Recent Labs    01/13/17 0302  TSH 0.887   Anemia Panel: Recent Labs    01/13/17 1103  VITAMINB12 181  FOLATE 7.9  FERRITIN 147  TIBC 259  IRON 104  RETICCTPCT 1.0   Urine analysis:    Component Value Date/Time   COLORURINE YELLOW 01/13/2017 0956   APPEARANCEUR CLEAR 01/13/2017 0956   LABSPEC 1.006 01/13/2017 0956   PHURINE 5.0 01/13/2017 0956   GLUCOSEU NEGATIVE 01/13/2017 0956   HGBUR MODERATE (A) 01/13/2017 0956   BILIRUBINUR NEGATIVE 01/13/2017 0956   KETONESUR NEGATIVE 01/13/2017 0956   PROTEINUR NEGATIVE 01/13/2017 0956   NITRITE NEGATIVE 01/13/2017 0956   LEUKOCYTESUR NEGATIVE 01/13/2017 0956     Ripudeep Rai M.D. [facility] 01/15/2017, 10:13 AM  Pager: [phone] Between 7am to 7pm - call Pager - [phone]  After 7pm go to [email or web address] - password [email or web address]  Call night coverage person  covering after 7pm

## 2017-01-15 NOTE — Progress Notes (Signed)
PT Cancellation Note  Patient Details Name: [name] [name] MRN: [id number] DOB: 03/21/1955   Cancelled Treatment:    Reason Eval/Treat Not Completed: Other (comment)(Up with nursing; transfer to tele today.) Will return tomorrow for PT evaluation.  Despina Pole 01/15/2017, 5:28 PM Carita Pian. Sanjuana Kava, [place] Pager [phone]

## 2017-01-16 LAB — BASIC METABOLIC PANEL
Anion gap: 9 (ref 5–15)
BUN: 17 mg/dL (ref 6–20)
CALCIUM: 8.7 mg/dL — AB (ref 8.9–10.3)
CO2: 17 mmol/L — ABNORMAL LOW (ref 22–32)
CREATININE: 1.32 mg/dL — AB (ref 0.61–1.24)
Chloride: 116 mmol/L — ABNORMAL HIGH (ref 101–111)
GFR calc Af Amer: >60 mL/min (ref >60)
GFR, EST NON AFRICAN AMERICAN: 57 mL/min — AB (ref >60)
Glucose, Bld: 117 mg/dL — ABNORMAL HIGH (ref 65–99)
Potassium: 4.1 mmol/L (ref 3.5–5.1)
SODIUM: 142 mmol/L (ref 135–145)

## 2017-01-16 LAB — CBC
HCT: 27.3 % — ABNORMAL LOW (ref 39.0–52.0)
Hemoglobin: 9.2 g/dL — ABNORMAL LOW (ref 13.0–17.0)
MCH: 30.4 pg (ref 26.0–34.0)
MCHC: 33.7 g/dL (ref 30.0–36.0)
MCV: 90.1 fL (ref 78.0–100.0)
PLATELETS: 208 10*3/uL (ref 150–400)
RBC: 3.03 MIL/uL — ABNORMAL LOW (ref 4.22–5.81)
RDW: 13.4 % (ref 11.5–15.5)
WBC: 5.7 10*3/uL (ref 4.0–10.5)

## 2017-01-16 MED ORDER — DIPHENHYDRAMINE-ZINC ACETATE 2-0.1 % EX CREA
TOPICAL_CREAM | Freq: Three times a day (TID) | CUTANEOUS | Status: DC | PRN
  Filled 2017-01-16: qty 28

## 2017-01-16 MED ORDER — PREDNISONE 20 MG PO TABS
20.0000 mg | ORAL_TABLET | Freq: Every day | ORAL | Status: DC
  Administered 2017-01-16 – 2017-01-17 (×2): 20 mg via ORAL
  Filled 2017-01-16: qty 1

## 2017-01-16 NOTE — Progress Notes (Signed)
Transferred to [place] room 24 by wheelchair, stable, report given to RN. Belongings taken by pt.

## 2017-01-16 NOTE — Progress Notes (Signed)
ANTIBIOTIC CONSULT NOTE Pharmacy Consult for Rocephin Indication: Sinusitis, Pyelo  Allergies  Allergen Reactions  . Iohexol Other (See Comments)     Code: HIVES, Desc: PT STATES HE HAD AN IVP 15 YRS AGO AND HAD SOB AND BROKE OUT IN HIVES., Onset Date: [id number]   . Ivp Dye [Iodinated Diagnostic Agents] Other (See Comments)    intolerance    Patient Measurements: Height: 5\' 9"  (175.3 cm) Weight: 249 lb 9 oz (113.2 kg) IBW/kg (Calculated) : 70.7 Adjusted Body Weight:    Vital Signs: Temp: 98.1 F (36.7 C) (11/12 0727) Temp Source: Oral (11/12 0727) BP: 105/48 (11/12 0727) Pulse Rate: 45 (11/12 0727) Intake/Output from previous day: 11/11 0701 - 11/12 0700 In: 1653.3 [P.O.:360; I.V.:1193.3; IV Piggyback:100] Out: 250 [Urine:250] Intake/Output from this shift: No intake/output data recorded.  Labs: Recent Labs    01/14/17 0316 01/15/17 1011 01/16/17 0405  WBC 4.2  --  5.7  HGB 9.2*  --  9.2*  PLT 194  --  208  CREATININE 2.79* 1.60* 1.32*   Estimated Creatinine Clearance: 72.9 mL/min (A) (by C-G formula based on SCr of 1.32 mg/dL (H)). No results for input(s): VANCOTROUGH, VANCOPEAK, VANCORANDOM, GENTTROUGH, GENTPEAK, GENTRANDOM, TOBRATROUGH, TOBRAPEAK, TOBRARND, AMIKACINPEAK, AMIKACINTROU, AMIKACIN in the last 72 hours.   Microbiology:   Medical History: Past Medical History:  Diagnosis Date  . Adenomatous colon polyp   . Anxiety   . Anxiety disorder   . Arthritis    "everywhere"   . CAD S/P percutaneous coronary angioplasty    a. 2006: Taxus DES to RCA; March '06 Cypher DES to LAD; EF 66% 5/18 Thrombectomy/PCI with Promus DES-->RCA LV gram normal. b. 07/2016: Inferior STEMI with 100% stenosis of mid-RCA with a patent LAD stent and patent LM and LCx. PCI was performed of the RCA with aspiration thrombectomy and placement of 3.5 x 38 mm Promus DES.  [name] Chronic pain syndrome    , as noted by handwritten note from primary physician   . COPD (chronic obstructive  pulmonary disease) ([place])   . Depression   . Dyslipidemia, goal LDL below 70   . Fibromyalgia   . FRACTURE, RIB, LEFT 11/15/2006   Qualifier: Diagnosis of  By: Drinkard MSN, FNP-C, Collie Siad    . GERD (gastroesophageal reflux disease)    on occas. uses TUMS for heartburn   . Headache    pt. remarks that he gets sinus headaches   . History of migraine headaches   . Hypertension, essential, benign   . Neuromuscular disorder (HCC)    L leg, nerve damage   . Obesity, Class II, BMI 35-39.9   . Pneumonia 2015   hosp. after RIB fx   . Tobacco abuse     Assessment:  ID: ctx- acute pyelonephritis. Consult says sinusitis?  Afebrile today. WBC 5.7 WNL. Scr 1.32 down  11/9 group A strep (throat) - neg 11/9 urine - neg Rapid strep- neg 11/10 BCx x 2> neg   Goal of Therapy:  Eradication of infection  Plan:  Rocephin 2g IV q 24hr dose ok. No renal adjustment required. Pharmacy will sign off. Please reconsult for further dosing assitance.  Kearston Putman S. Alford Highland, PharmD, BCPS Clinical Staff Pharmacist Pager [phone]  Eilene Ghazi Stillinger 01/16/2017,8:47 AM

## 2017-01-16 NOTE — Progress Notes (Signed)
Patient at rest SB 38-45 with pauses 2.3sec asymptomatic BP stable. MD notified no new orders at this time. I will continue to monitor.

## 2017-01-16 NOTE — Progress Notes (Signed)
[facility]                                                                              Patient Demographics  [name] [name], is a 61 y.o. male, DOB - 1955/10/29, KZL:[id number]  Admit date - 01/12/2017   Admitting Physician Norval Morton, MD  Outpatient Primary MD for the patient is Jani Gravel, MD  Outpatient specialists:   LOS - 3  days   Medical records reviewed and are as summarized below:    Chief Complaint  Patient presents with  . Emesis       Brief summary   [name] [name] is a 61 y.o. 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    No hematochezia or melena, diarrhea up to 5 times per day.  Also reported fevers, chills, abdominal cramping, dark urine, dysuria. He recalls taking azithromycin approximately 1 month ago. In ED, BP was in the low 60s-70s, creatinine 5.64, patient was admitted for further workup.    Assessment & Plan    Principal Problem:   ARF (acute renal failure) (HCC) - Baseline creatinine 1-1.1, presented with creatinine of 5.64, BUN 49 - Recent history of nausea, vomiting, diarrhea, also medication effect from lisinopril, Lasix - CT abdomen pelvis showed mild to moderate left perinephric and periureteral stranding -Creatinine improving, 1.3 today, close to baseline  Active Problems:  Acute pyelonephritis -Blood cultures negative so far, urine cultures neg -Continue IV Rocephin, will transition to ceftin at discharge    Hypotension: Likely due to severe dehydration, nausea, vomiting, diarrhea, poor appetite, on multiple antihypertensives at home -BP improving, for now continue to hold Cardizem, Lasix, metoprolol, lisinopril.   -Pro calcitonin 0.12, cortisol level 4.1 -On stress dose steroids, changed to prednisone with quick taper    CAD S/P percutaneous  coronary angioplasty -Troponin negative, no chest pain or shortness of breath -Continue aspirin, statin, Brilinta    Dyslipidemia, goal LDL below 70 -Continue statin    Chronic pain -Continue pain medication as needed    Nausea, vomiting, and diarrhea -Currently improving, no frank diarrhea -Encouraged oral diet -FOBT negative  Hypocalcemia -Resolved  Anemia -Stable H&H  Obesity -BMI 36.8, recommended diet and weight control  Sinus bradycardia -Asymptomatic, EKG does not show any high-grade blocks, currently not on Cardizem or beta-blocker.    Code Status: Full code DVT Prophylaxis:   heparin Family Communication: Discussed in detail with the patient, all imaging results, lab results explained to the patient    Disposition Plan: Start PT OT, transfer to telemetry, likely DC in a.m.  Time Spent in minutes   25 minutes  Procedures:  CT abdomen  Consultants:   None  Antimicrobials:   IV Rocephin 11/9>   Medications  Scheduled Meds: . allopurinol  100 mg Oral Daily  . aspirin EC  81 mg Oral Daily  . heparin  5,000 Units Subcutaneous Q8H  . hydrOXYzine  50 mg Oral TID  . predniSONE  20 mg Oral Q breakfast  .  rosuvastatin  20 mg Oral Daily  . ticagrelor  90 mg Oral BID   Continuous Infusions: . cefTRIAXone (ROCEPHIN)  IV 2 g (01/15/17 2146)  . sodium chloride     PRN Meds:.acetaminophen, ALPRAZolam, calcium carbonate, diphenhydrAMINE, diphenhydrAMINE-zinc acetate, ipratropium-albuterol, morphine injection, ondansetron **OR** ondansetron (ZOFRAN) IV   Antibiotics   Anti-infectives (From admission, onward)   Start     Dose/Rate Route Frequency Ordered Stop   01/13/17 2200  cefTRIAXone (ROCEPHIN) 2 g in dextrose 5 % 50 mL IVPB     2 g 100 mL/hr over 30 Minutes Intravenous Every 24 hours 01/13/17 0109     01/13/17 0045  cefTRIAXone (ROCEPHIN) 2 g in dextrose 5 % 50 mL IVPB     2 g 100 mL/hr over 30 Minutes Intravenous  Once 01/13/17 0033 01/13/17 0240         Subjective:   [name] [name] was seen and examined today.  No complaints.  No fevers or chills.  Overnight heart rate low in 40s.  BP soft but improving.  Patient denies dizziness, chest pain, shortness of breath, abdominal pain,  new weakness, numbess, tingling.  Objective:   Vitals:   01/15/17 1615 01/15/17 2005 01/16/17 0118 01/16/17 0727  BP: 107/60 (!) 108/97 116/70 (!) 105/48  Pulse: 62   (!) 45  Resp: (!) 22 16 16 18   Temp: 98 F (36.7 C) 98.1 F (36.7 C) 98.4 F (36.9 C) 98.1 F (36.7 C)  TempSrc: Oral Oral  Oral  SpO2: 97% 100% 100% 99%  Weight:      Height:        Intake/Output Summary (Last 24 hours) at 01/16/2017 1018 Last data filed at 01/16/2017 0800 Gross per 24 hour  Intake 703.75 ml  Output 250 ml  Net 453.75 ml     Wt Readings from Last 3 Encounters:  01/13/17 113.2 kg (249 lb 9 oz)  11/08/16 116.6 kg (257 lb)  08/05/16 116.1 kg (256 lb)     Exam   General: sleepy but arousable   Eyes:   HEENT:  Atraumatic, normocephalic, normal oropharynx  Cardiovascular: S1 S2 auscultated, no rubs, murmurs or gallops. Regular rate and rhythm. No pedal edema b/l  Respiratory: Clear to auscultation bilaterally, no wheezing, rales or rhonchi  Gastrointestinal: Soft, nontender, nondistended, + bowel sounds  Ext: no pedal edema bilaterally  Neuro: no new deficits  Musculoskeletal: No digital cyanosis, clubbing  Skin: No rashes  Psych: sleepy but oriented    Data Reviewed:  I have personally reviewed following labs and imaging studies  Micro Results Recent Results (from the past 240 hour(s))  MRSA PCR Screening     Status: None   Collection Time: 01/13/17  8:30 AM  Result Value Ref Range Status   MRSA by PCR NEGATIVE NEGATIVE Final    Comment:        The GeneXpert MRSA Assay (FDA approved for NASAL specimens only), is one component of a comprehensive MRSA colonization surveillance program. It is not intended to diagnose  MRSA infection nor to guide or monitor treatment for MRSA infections.   Rapid strep screen (not at [facility])     Status: None   Collection Time: 01/13/17  9:09 AM  Result Value Ref Range Status   Streptococcus, Group A Screen (Direct) NEGATIVE NEGATIVE Final    Comment: (NOTE) A Rapid Antigen test may result negative if the antigen level in the sample is below the detection level of this test. The FDA has not cleared this test  as a stand-alone test therefore the rapid antigen negative result has reflexed to a Group A Strep culture.   Culture, group A strep     Status: None   Collection Time: 01/13/17  9:09 AM  Result Value Ref Range Status   Specimen Description THROAT  Final   Special Requests NONE Reflexed from [id number]  Final   Culture NO GROUP A STREP (S.PYOGENES) ISOLATED  Final   Report Status 01/15/2017 FINAL  Final  Urine Culture     Status: None   Collection Time: 01/13/17  9:40 AM  Result Value Ref Range Status   Specimen Description URINE, RANDOM  Final   Special Requests NONE  Final   Culture NO GROWTH  Final   Report Status 01/14/2017 FINAL  Final  Culture, blood (routine x 2)     Status: None (Preliminary result)   Collection Time: 01/14/17  6:30 AM  Result Value Ref Range Status   Specimen Description BLOOD RIGHT ARM  Final   Special Requests IN PEDIATRIC BOTTLE Blood Culture adequate volume  Final   Culture NO GROWTH 1 DAY  Final   Report Status PENDING  Incomplete  Culture, blood (routine x 2)     Status: None (Preliminary result)   Collection Time: 01/14/17  6:38 AM  Result Value Ref Range Status   Specimen Description BLOOD RIGHT HAND  Final   Special Requests IN PEDIATRIC BOTTLE Blood Culture adequate volume  Final   Culture NO GROWTH 1 DAY  Final   Report Status PENDING  Incomplete    Radiology Reports Ct Abdomen Pelvis Wo Contrast  Result Date: 01/13/2017 CLINICAL DATA:  Nausea, vomiting and diarrhea EXAM: CT ABDOMEN AND PELVIS WITHOUT CONTRAST  TECHNIQUE: Multidetector CT imaging of the abdomen and pelvis was performed following the standard protocol without IV contrast. COMPARISON:  CT abdomen pelvis 08/11/2014 FINDINGS: Lower chest: No pulmonary nodules or pleural effusion. No visible pericardial effusion. Hepatobiliary: Normal hepatic contours and density. No visible biliary dilatation. Normal gallbladder. Pancreas: Normal contours without ductal dilatation. No peripancreatic fluid collection. Spleen: Normal. Adrenals/Urinary Tract: --Adrenal glands: Normal. --Right kidney/ureter: No hydronephrosis or perinephric stranding. No nephrolithiasis. No obstructing ureteral stones. --Left kidney/ureter: There is mild to moderate perinephric and periureteral stranding. No hydronephrosis. No nephrolithiasis or obstructing ureteral stone. The degree of stranding is similar to the prior study. There is a cyst at the left lower pole measuring 1.9 cm. --Urinary bladder: Unremarkable. Stomach/Bowel: --Stomach/Duodenum: No hiatal hernia or other gastric abnormality. Normal duodenal course and caliber. --Small bowel: No dilatation or inflammation. --Colon: No focal abnormality. --Appendix: Surgically absent. Vascular/Lymphatic: Atherosclerotic calcification is present within the non-aneurysmal abdominal aorta, without hemodynamically significant stenosis. No abdominal or pelvic lymphadenopathy. Reproductive: Normal prostate and seminal vesicles. Musculoskeletal. No bony spinal canal stenosis or focal osseous abnormality. Other: None. IMPRESSION: 1. No acute abdominopelvic abnormality. 2. Mild-to-moderate left perinephric and periureteral stranding is unchanged from prior studies. 3.  Aortic Atherosclerosis (ICD10-I70.0). Electronically Signed   By: Ulyses Jarred M.D.   On: 01/13/2017 04:59    Lab Data:  CBC: Recent Labs  Lab 01/12/17 1635 01/13/17 0502 01/14/17 0316 01/16/17 0405  WBC 10.0 5.8 4.2 5.7  HGB 11.3* 8.0* 9.2* 9.2*  HCT 33.9* 24.6* 27.8* 27.3*   MCV 89.9 90.4 91.7 90.1  PLT 258 169 194 161   Basic Metabolic Panel: Recent Labs  Lab 01/12/17 1635 01/13/17 0502 01/14/17 0316 01/15/17 1011 01/16/17 0405  NA 133* 136 139 141 142  K 4.8 4.0 4.9 4.3  4.1  CL 102 113* 114* 115* 116*  CO2 18* 15* 20* 18* 17*  GLUCOSE 103* 78 100* 126* 117*  BUN 49* 41* 37* 21* 17  CREATININE 5.64* 4.17* 2.79* 1.60* 1.32*  CALCIUM 8.9 6.5* 8.5* 9.1 8.7*   GFR: Estimated Creatinine Clearance: 72.9 mL/min (A) (by C-G formula based on SCr of 1.32 mg/dL (H)). Liver Function Tests: Recent Labs  Lab 01/12/17 1635  AST 19  ALT 21  ALKPHOS 73  BILITOT 0.7  PROT 7.3  ALBUMIN 4.4   Recent Labs  Lab 01/12/17 1635  LIPASE 20   No results for input(s): AMMONIA in the last 168 hours. Coagulation Profile: No results for input(s): INR, PROTIME in the last 168 hours. Cardiac Enzymes: Recent Labs  Lab 01/13/17 0301  TROPONINI <0.03   BNP (last 3 results) No results for input(s): PROBNP in the last 8760 hours. HbA1C: No results for input(s): HGBA1C in the last 72 hours. CBG: No results for input(s): GLUCAP in the last 168 hours. Lipid Profile: No results for input(s): CHOL, HDL, LDLCALC, TRIG, CHOLHDL, LDLDIRECT in the last 72 hours. Thyroid Function Tests: No results for input(s): TSH, T4TOTAL, FREET4, T3FREE, THYROIDAB in the last 72 hours. Anemia Panel: Recent Labs    01/13/17 1103  VITAMINB12 181  FOLATE 7.9  FERRITIN 147  TIBC 259  IRON 104  RETICCTPCT 1.0   Urine analysis:    Component Value Date/Time   COLORURINE YELLOW 01/13/2017 0956   APPEARANCEUR CLEAR 01/13/2017 0956   LABSPEC 1.006 01/13/2017 0956   PHURINE 5.0 01/13/2017 0956   GLUCOSEU NEGATIVE 01/13/2017 0956   HGBUR MODERATE (A) 01/13/2017 0956   BILIRUBINUR NEGATIVE 01/13/2017 0956   KETONESUR NEGATIVE 01/13/2017 0956   PROTEINUR NEGATIVE 01/13/2017 0956   NITRITE NEGATIVE 01/13/2017 0956   LEUKOCYTESUR NEGATIVE 01/13/2017 0956     Ellamarie Naeve  M.D. [facility] 01/16/2017, 10:18 AM  Pager: [phone] Between 7am to 7pm - call Pager - [phone]  After 7pm go to [email or web address] - password [email or web address]  Call night coverage person covering after 7pm

## 2017-01-16 NOTE — Evaluation (Signed)
Physical Therapy Evaluation Patient Details Name: [name] [name] MRN: [id number] DOB: 17-Jan-1956 Today's Date: 01/16/2017   History of Present Illness  61 y.o. 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  He presented to the ED with complaints of nausea, vomiting, and diarrhea x 1 week.  Pt admitted for acute renal failure.   Clinical Impression  Pt admitted with above diagnosis. Pt currently with functional limitations due to the deficits listed below (see PT Problem List). On eval, pt demo independence with bed mobility and transfers. Supervision provided for ambulation 250 feet without AD. Pt will benefit from skilled PT to increase their independence and safety with mobility to allow discharge to the venue listed below.  Discussed at length the benefits of HHPT at discharge to assist wit quicker return to PLOF. Pt declining any follow up services. Pt demo good potential for return to independent community ambulator, just at a slower pace without home health services. PT to follow acutely.     Follow Up Recommendations No PT follow up;Supervision - Intermittent(Pt declining HHPT.)    Equipment Recommendations  None recommended by PT    Recommendations for Other Services       Precautions / Restrictions Precautions Precautions: None Restrictions Weight Bearing Restrictions: No      Mobility  Bed Mobility Overal bed mobility: Independent                Transfers Overall transfer level: Modified independent Equipment used: None                Ambulation/Gait Ambulation/Gait assistance: Supervision Ambulation Distance (Feet): 250 Feet Assistive device: None Gait Pattern/deviations: Decreased stride length;Drifts right/left   Gait velocity interpretation: >2.62 ft/sec, indicative of independent community ambulator General Gait Details: Pt with c/o bilat hip and knee  pain.  Stairs            Wheelchair Mobility    Modified Rankin (Stroke Patients Only)       Balance Overall balance assessment: Needs assistance Sitting-balance support: No upper extremity supported;Feet supported Sitting balance-Leahy Scale: Normal     Standing balance support: No upper extremity supported;During functional activity Standing balance-Leahy Scale: Fair                               Pertinent Vitals/Pain Pain Assessment: Faces Faces Pain Scale: Hurts little more Pain Location: "all over" Pain Descriptors / Indicators: Grimacing;Aching Pain Intervention(s): Monitored during session    Home Living Family/patient expects to be discharged to:: Private residence Living Arrangements: Alone Available Help at Discharge: Family;Available PRN/intermittently Type of Home: House Home Access: Ramped entrance     Home Layout: One level Home Equipment: Walker - 2 wheels      Prior Function Level of Independence: Independent               Hand Dominance        Extremity/Trunk Assessment   Upper Extremity Assessment Upper Extremity Assessment: Overall WFL for tasks assessed    Lower Extremity Assessment Lower Extremity Assessment: Overall WFL for tasks assessed    Cervical / Trunk Assessment Cervical / Trunk Assessment: Normal  Communication   Communication: No difficulties  Cognition Arousal/Alertness: Awake/alert Behavior During Therapy: Agitated Overall Cognitive Status: Within Functional Limits for tasks assessed  General Comments      Exercises     Assessment/Plan    PT Assessment Patient needs continued PT services  PT Problem List Decreased activity tolerance;Decreased balance;Decreased mobility;Cardiopulmonary status limiting activity       PT Treatment Interventions Gait training;Stair training;Functional mobility training;Therapeutic  activities;Therapeutic exercise;Patient/family education;Balance training    PT Goals (Current goals can be found in the Care Plan section)  Acute Rehab PT Goals Patient Stated Goal: home PT Goal Formulation: With patient Time For Goal Achievement: 01/23/17 Potential to Achieve Goals: Good    Frequency Min 3X/week   Barriers to discharge        Co-evaluation               AM-PAC PT "6 Clicks" Daily Activity  Outcome Measure Difficulty turning over in bed (including adjusting bedclothes, sheets and blankets)?: None Difficulty moving from lying on back to sitting on the side of the bed? : None Difficulty sitting down on and standing up from a chair with arms (e.g., wheelchair, bedside commode, etc,.)?: A Little Help needed moving to and from a bed to chair (including a wheelchair)?: None Help needed walking in hospital room?: None Help needed climbing 3-5 steps with a railing? : A Little 6 Click Score: 22    End of Session Equipment Utilized During Treatment: Gait belt Activity Tolerance: Patient tolerated treatment well Patient left: in bed;with bed alarm set;with call bell/phone within reach Nurse Communication: Mobility status PT Visit Diagnosis: Difficulty in walking, not elsewhere classified (R26.2);Unsteadiness on feet (R26.81)    Time: 7209-4709 PT Time Calculation (min) (ACUTE ONLY): 22 min   Charges:   PT Evaluation $PT Eval Moderate Complexity: 1 Mod     PT G Codes:        Lorrin Goodell, PT  Office # [phone] Pager [phone]   Lorriane Shire 01/16/2017, 9:32 AM

## 2017-01-16 NOTE — Progress Notes (Signed)
Notified MD about rash to right leg c/o itching. Order received for cream will use per order and continue to monitor.

## 2017-01-16 NOTE — Consult Note (Addendum)
   [facility] CM Inpatient Consult   01/16/2017  ERVING SASSANO 15-May-1955 [id number]    Spoke with Mr. Ocallaghan at bedside to discuss [facility] services.  Confirmed his Primary Care MD is Dr Maudie Mercury.   His PCP office is listed as doing their own post discharge transition calls.  Mr. Lefever is agreeable to automated General EMMI calls however.  Provided Mr. Spinney with mobile meals information if needed.  He lives alone.  States his family can provide transportation if needed. Denies having any concerns with obtaining medications. However, Mr. Vanroekel states that in case he does not cook he may be interested in a local food program.  Declines need for [facility] LCSW referral.  Appreciative of mobile meals and [place] free meals information. Provided [facility] Care Management brochure with contact information and 24-hr nurse line magnet.  Made inpatient RNCM aware.  Will make referral for General EMMI transition calls.   Marthenia Rolling, MSN-Ed, RN,BSN [facility] Liaison [phone]

## 2017-01-17 MED ORDER — PREDNISONE 10 MG PO TABS: ORAL_TABLET | ORAL | 0 refills | Status: DC

## 2017-01-17 MED ORDER — DIPHENHYDRAMINE-ZINC ACETATE 2-0.1 % EX CREA: TOPICAL_CREAM | Freq: Three times a day (TID) | CUTANEOUS | 0 refills | Status: DC | PRN

## 2017-01-17 MED ORDER — CEFUROXIME AXETIL 500 MG PO TABS: 500.0000 mg | ORAL_TABLET | Freq: Two times a day (BID) | ORAL | 0 refills | Status: DC

## 2017-01-17 MED ORDER — CEFUROXIME AXETIL 500 MG PO TABS
500.0000 mg | ORAL_TABLET | Freq: Two times a day (BID) | ORAL | Status: DC
  Administered 2017-01-17: 500 mg via ORAL
  Filled 2017-01-17 (×2): qty 1

## 2017-01-17 NOTE — Care Management Important Message (Signed)
Important Message  Patient Details  Name: [name] [name] MRN: [id number] Date of Birth: [birth year]   Medicare Important Message Given:  Yes    Nathen May 01/17/2017, 11:53 AM

## 2017-01-17 NOTE — Progress Notes (Addendum)
Nsg Discharge Note  Admit Date:  01/12/2017 Discharge date: 01/17/2017   ARCH METHOT to be D/C'd Home per MD order.  AVS completed.  Copy for chart, and copy for patient signed, and dated. Patient/caregiver able to verbalize understanding.  Discharge Medication: Allergies as of 01/17/2017      Reactions   Iohexol Other (See Comments)    Code: HIVES, Desc: PT STATES HE HAD AN IVP 15 YRS AGO AND HAD SOB AND BROKE OUT IN HIVES., Onset Date: [id number]   Ivp Dye [iodinated Diagnostic Agents] Other (See Comments)   intolerance      Medication List    STOP taking these medications   furosemide 40 MG tablet Commonly known as:  LASIX   lisinopril 5 MG tablet Commonly known as:  PRINIVIL,ZESTRIL   metoprolol tartrate 25 MG tablet Commonly known as:  LOPRESSOR   potassium chloride SA 20 MEQ tablet Commonly known as:  K-DUR,KLOR-CON     TAKE these medications   acetaminophen 500 MG tablet Commonly known as:  TYLENOL Take 500-1,000 mg by mouth every 6 (six) hours as needed for moderate pain or headache.   allopurinol 100 MG tablet Commonly known as:  ZYLOPRIM Take 200 mg daily by mouth.   ALPRAZolam 0.5 MG tablet Commonly known as:  XANAX Take 0.5 mg by mouth 3 (three) times daily.   aspirin EC 81 MG tablet Take 1 tablet (81 mg total) by mouth daily.   betamethasone valerate 0.1 % cream Commonly known as:  VALISONE Apply 1 application topically as needed (for rash).   BRILINTA 90 MG Tabs tablet Generic drug:  ticagrelor Take 1 tablet by mouth 2 (two) times daily.   calcium carbonate 750 MG chewable tablet Commonly known as:  TUMS EX Chew 2-4 tablets by mouth daily as needed for heartburn.   cefUROXime 500 MG tablet Commonly known as:  CEFTIN Take 1 tablet (500 mg total) 2 (two) times daily with a meal by mouth. X 10 days   colchicine 0.6 MG tablet Take 0.6 mg every other day by mouth.   diltiazem 120 MG 24 hr capsule Commonly known as:  CARDIZEM CD Take 120  mg by mouth daily.   diphenhydrAMINE-zinc acetate cream Commonly known as:  BENADRYL Apply 3 (three) times daily as needed topically for itching. Apply to rash on leg   fluticasone 50 MCG/ACT nasal spray Commonly known as:  FLONASE Place 1 spray into both nostrils daily as needed for allergies.   Fluticasone-Salmeterol 100-50 MCG/DOSE Aepb Commonly known as:  ADVAIR Inhale 1 puff into the lungs 2 (two) times daily as needed (for shortness of breath).   hydrOXYzine 50 MG tablet Commonly known as:  ATARAX/VISTARIL Take 50 mg by mouth 3 (three) times daily.   morphine 30 MG tablet Commonly known as:  MSIR Take 30 mg by mouth 4 (four) times daily as needed (pain).   nitroGLYCERIN 0.4 MG SL tablet Commonly known as:  NITROSTAT Place 1 tablet every 5 (five) minutes as needed under the tongue for chest pain. X 3 doses   predniSONE 10 MG tablet Commonly known as:  DELTASONE Prednisone dosing: Take  Prednisone 20mg  (2 tabs) x 2 days, then 10mg  (1 tab) x 2days, then OFF.   rosuvastatin 20 MG tablet Commonly known as:  CRESTOR Take 1 tablet (20 mg total) by mouth daily.   varenicline 1 MG tablet Commonly known as:  CHANTIX Take 1 tablet (1 mg total) by mouth 2 (two) times daily. What changed:  when to take this  reasons to take this   Vitamin D-3 1000 units Caps Take 1,000 Units by mouth daily.       Discharge Assessment: Vitals:   01/16/17 2119 01/17/17 0531  BP: 105/71 121/61  Pulse: 62 64  Resp: 18 18  Temp: 98.4 F (36.9 C) 98.5 F (36.9 C)  SpO2: 99% 99%   Skin clean, dry and intact without evidence of skin break down, no evidence of skin tears noted. IV catheter discontinued intact. Site without signs and symptoms of complications - no redness or edema noted at insertion site, patient denies c/o pain - only slight tenderness at site.  Dressing with slight pressure applied.  D/c Instructions-Education: Discharge instructions given to patient/family with  verbalized understanding. D/c education completed with patient/family including follow up instructions, medication list, d/c activities limitations if indicated, with other d/c instructions as indicated by MD - patient able to verbalize understanding, all questions fully answered. Patient instructed to return to ED, call 911, or call MD for any changes in condition.  Pt currently waiting on brother to come and pick him up Ride arrived at 13:00 pt has now left Patient escorted via [place], and D/C home via private auto.  Salley Slaughter, RN 01/17/2017 12:09 PM

## 2017-01-17 NOTE — Discharge Summary (Signed)
Physician Discharge Summary   Patient ID: [name] [name] MRN: [id number] DOB/AGE: [birth year] 61 y.o.  Admit date: 01/12/2017 Discharge date: 01/17/2017  Primary Care Physician:  Jani Gravel, MD  Discharge Diagnoses:    . ARF (acute renal failure) ([place]) . Hypotension . Dyslipidemia, goal LDL below 70 . Chronic pain . Nausea, vomiting, and diarrhea . Anxiety   Pyelonephritis   Coronary artery disease status post percutaneous coronary angioplasty  Consults:none   Recommendations for Outpatient Follow-up:  1. Patient was recommended home health PT OT, he declined 2. Please repeat CBC/BMET at next visit   DIET: Heart healthy diet    Allergies:   Allergies  Allergen Reactions  . Iohexol Other (See Comments)     Code: HIVES, Desc: PT STATES HE HAD AN IVP 15 YRS AGO AND HAD SOB AND BROKE OUT IN HIVES., Onset Date: [id number]   . Ivp Dye [Iodinated Diagnostic Agents] Other (See Comments)    intolerance     DISCHARGE MEDICATIONS: Current Discharge Medication List    START taking these medications   Details  cefUROXime (CEFTIN) 500 MG tablet Take 1 tablet (500 mg total) 2 (two) times daily with a meal by mouth. X 10 days Qty: 20 tablet, Refills: 0    diphenhydrAMINE-zinc acetate (BENADRYL) cream Apply 3 (three) times daily as needed topically for itching. Apply to rash on leg Qty: 28 g, Refills: 0    predniSONE (DELTASONE) 10 MG tablet Prednisone dosing: Take  Prednisone 20mg  (2 tabs) x 2 days, then 10mg  (1 tab) x 2days, then OFF. Qty: 6 tablet, Refills: 0      CONTINUE these medications which have NOT CHANGED   Details  acetaminophen (TYLENOL) 500 MG tablet Take 500-1,000 mg by mouth every 6 (six) hours as needed for moderate pain or headache.    allopurinol (ZYLOPRIM) 100 MG tablet Take 200 mg daily by mouth.    ALPRAZolam (XANAX) 0.5 MG tablet Take 0.5 mg by mouth 3 (three) times daily.    aspirin EC 81 MG tablet Take 1 tablet (81 mg total) by mouth daily. Qty:  90 tablet, Refills: 3    betamethasone valerate (VALISONE) 0.1 % cream Apply 1 application topically as needed (for rash).     BRILINTA 90 MG TABS tablet Take 1 tablet by mouth 2 (two) times daily.    calcium carbonate (TUMS EX) 750 MG chewable tablet Chew 2-4 tablets by mouth daily as needed for heartburn.     Cholecalciferol (VITAMIN D-3) 1000 UNITS CAPS Take 1,000 Units by mouth daily.     colchicine 0.6 MG tablet Take 0.6 mg every other day by mouth.    diltiazem (CARDIZEM CD) 120 MG 24 hr capsule Take 120 mg by mouth daily.    fluticasone (FLONASE) 50 MCG/ACT nasal spray Place 1 spray into both nostrils daily as needed for allergies.     Fluticasone-Salmeterol (ADVAIR) 100-50 MCG/DOSE AEPB Inhale 1 puff into the lungs 2 (two) times daily as needed (for shortness of breath).     hydrOXYzine (ATARAX/VISTARIL) 50 MG tablet Take 50 mg by mouth 3 (three) times daily.    morphine (MSIR) 30 MG tablet Take 30 mg by mouth 4 (four) times daily as needed (pain).     nitroGLYCERIN (NITROSTAT) 0.4 MG SL tablet Place 1 tablet every 5 (five) minutes as needed under the tongue for chest pain. X 3 doses Refills: 11    rosuvastatin (CRESTOR) 20 MG tablet Take 1 tablet (20 mg total) by mouth daily. Qty: 90  tablet, Refills: 3    varenicline (CHANTIX) 1 MG tablet Take 1 tablet (1 mg total) by mouth 2 (two) times daily. 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  No hematochezia or melena, diarrhea up to 5 times per day.  Also reported fevers, chills, abdominal cramping, dark urine, dysuria. He recalls taking azithromycin approximately 1 month ago. In ED, BP was in the low 60s-70s, creatinine 5.64, patient was admitted for further workup.   Hospital Course:  ARF (acute renal failure) ([facility]) - Baseline creatinine 1-1.1, presented with creatinine of 5.64, BUN 49 - Recent history of nausea, vomiting, diarrhea, also medication effect from lisinopril, Lasix - CT abdomen pelvis showed mild to moderate left perinephric and periureteral stranding - Creatinine improving, 1.3 at the time of discharge    Acute pyelonephritis -Blood cultures negative so far, urine cultures negative -Patient was placed on IV Rocephin at the time of admission, transition to oral Ceftin at the time of discharge for 10 days to complete the course.  Hypotension: Likely due to severe dehydration, nausea, vomiting, diarrhea, poor appetite, on multiple antihypertensives at home -At the time of admission, blood pressure in 60s, Cardizem, Lasix, metoprolol, lisinopril were held.  Patient was placed on aggressive IV fluid hydration.   - Pro calcitonin 0.12, cortisol level 4.1 -Patient was placed on stress dose steroids, changed to prednisone with quick taper at discharge -Patient recommended to continue to hold Lasix, metoprolol, lisinopril and follow-up with his PCP.  He may resume Cardizem if BP starts to run high.      CAD S/P percutaneous coronary angioplasty -Troponin negative, no chest pain or shortness of breath -Continue aspirin, statin, Brilinta    Dyslipidemia, goal LDL below 70 -Continue statin    Chronic pain -Continue pain medication as needed per outpatient regimen    Nausea, vomiting, and diarrhea -Currently improving, no frank diarrhea -Tolerating oral diet.  FOBT negative   Hypocalcemia -Resolved  Chronic anemia -Stable H&H, FOBT  negative  Obesity -BMI 36.8, recommended diet and weight control  Sinus bradycardia -Improving, asymptomatic, EKG does not show any high-grade blocks, beta-blocker discontinued.  May resume Cardizem for hypertension.   Day of Discharge BP 121/61 (BP Location: Left Arm)   Pulse 64   Temp 98.5 F (36.9 C) (Oral)   Resp 18   Ht 5\' 9"  (1.753 m)   Wt 112.7 kg (248 lb 8 oz)   SpO2 99%   BMI 36.70 kg/m   Physical Exam: General: Alert and awake oriented x3 not in any acute distress. HEENT: anicteric sclera, pupils reactive to light and accommodation CVS: S1-S2 clear no murmur rubs or gallops Chest: clear to auscultation bilaterally, no wheezing rales or rhonchi Abdomen: soft nontender, nondistended, normal bowel sounds Extremities: no cyanosis, clubbing or edema noted bilaterally Neuro: Cranial nerves II-XII intact, no focal neurological deficits   The results of significant diagnostics from  this hospitalization (including imaging, microbiology, ancillary and laboratory) are listed below for reference.    LAB RESULTS: Basic Metabolic Panel: Recent Labs  Lab 01/15/17 1011 01/16/17 0405  NA 141 142  K 4.3 4.1  CL 115* 116*  CO2 18* 17*  GLUCOSE 126* 117*  BUN 21* 17  CREATININE 1.60* 1.32*  CALCIUM 9.1 8.7*   Liver Function Tests: Recent Labs  Lab 01/12/17 1635  AST 19  ALT 21  ALKPHOS 73  BILITOT 0.7  PROT 7.3  ALBUMIN 4.4   Recent Labs  Lab 01/12/17 1635  LIPASE 20   No results for input(s): AMMONIA in the last 168 hours. CBC: Recent Labs  Lab 01/14/17 0316 01/16/17 0405  WBC 4.2 5.7  HGB 9.2* 9.2*  HCT 27.8* 27.3*  MCV 91.7 90.1  PLT 194 208   Cardiac Enzymes: Recent Labs  Lab 01/13/17 0301  TROPONINI <0.03   BNP: Invalid input(s): POCBNP CBG: No results for input(s): GLUCAP in the last 168 hours.  Significant Diagnostic Studies:  Ct Abdomen Pelvis Wo Contrast  Result Date: 01/13/2017 CLINICAL DATA:  Nausea, vomiting and diarrhea  EXAM: CT ABDOMEN AND PELVIS WITHOUT CONTRAST TECHNIQUE: Multidetector CT imaging of the abdomen and pelvis was performed following the standard protocol without IV contrast. COMPARISON:  CT abdomen pelvis 08/11/2014 FINDINGS: Lower chest: No pulmonary nodules or pleural effusion. No visible pericardial effusion. Hepatobiliary: Normal hepatic contours and density. No visible biliary dilatation. Normal gallbladder. Pancreas: Normal contours without ductal dilatation. No peripancreatic fluid collection. Spleen: Normal. Adrenals/Urinary Tract: --Adrenal glands: Normal. --Right kidney/ureter: No hydronephrosis or perinephric stranding. No nephrolithiasis. No obstructing ureteral stones. --Left kidney/ureter: There is mild to moderate perinephric and periureteral stranding. No hydronephrosis. No nephrolithiasis or obstructing ureteral stone. The degree of stranding is similar to the prior study. There is a cyst at the left lower pole measuring 1.9 cm. --Urinary bladder: Unremarkable. Stomach/Bowel: --Stomach/Duodenum: No hiatal hernia or other gastric abnormality. Normal duodenal course and caliber. --Small bowel: No dilatation or inflammation. --Colon: No focal abnormality. --Appendix: Surgically absent. Vascular/Lymphatic: Atherosclerotic calcification is present within the non-aneurysmal abdominal aorta, without hemodynamically significant stenosis. No abdominal or pelvic lymphadenopathy. Reproductive: Normal prostate and seminal vesicles. Musculoskeletal. No bony spinal canal stenosis or focal osseous abnormality. Other: None. IMPRESSION: 1. No acute abdominopelvic abnormality. 2. Mild-to-moderate left perinephric and periureteral stranding is unchanged from prior studies. 3.  Aortic Atherosclerosis (ICD10-I70.0). Electronically Signed   By: Ulyses Jarred M.D.   On: 01/13/2017 04:59    2D ECHO:   Disposition and Follow-up: Discharge Instructions    Diet - low sodium heart healthy   Complete by:  As directed     Discharge instructions   Complete by:  As directed    Please HOLD metoprolol, LASIX, lisinopril until you have followed up with your doctor. Check your BP daily. If you start having swelling in legs or shortness of breath, you may restart Lasix with potassium.   Increase activity slowly   Complete by:  As directed        DISPOSITION: home    [place]    Jani Gravel, MD. Schedule an appointment as soon as possible for a visit in 10 day(s).   Specialty:  Internal Medicine Why:  for follow-up. Recheck BMET for renal function Contact information: [address] [place] [address] [phone]            Time spent on Discharge: 40mins   Signed:   Ripudeep Rai M.D.  [facility] 01/17/2017, 11:54 AM Pager: [phone]

## 2017-01-17 NOTE — Progress Notes (Signed)
Physical Therapy Treatment Patient Details Name: [name] [name] MRN: [id number] DOB: Aug 12, 1955 Today's Date: 01/17/2017    History of Present Illness 61 y.o. 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  He presented to the ED with complaints of nausea, vomiting, and diarrhea x 1 week.  Pt admitted for acute renal failure.     PT Comments    On arrival to pts room, pt sitting EOB. Initially pt unwilling to participate with therapy, stating he has been getting up on his own already. Agitated when PTA advised need to check off stairs, however pt agreed to stand up and participate. Pt is impulsive with mobility and has no recollection of receiving therapy on previous day. Shows some signs of cognitive deficits. Unsure if this is baseline. Pt would benefit from continued skilled PT to maximize safety with mobility. Will continue to follow.    Follow Up Recommendations  No PT follow up;Supervision - Intermittent(Pt declining HHPT.)     Equipment Recommendations  None recommended by PT    Recommendations for Other Services       Precautions / Restrictions Precautions Precautions: None Restrictions Weight Bearing Restrictions: No    Mobility  Bed Mobility Overal bed mobility: Independent                Transfers Overall transfer level: Modified independent Equipment used: None                Ambulation/Gait Ambulation/Gait assistance: Supervision Ambulation Distance (Feet): 200 Feet Assistive device: None Gait Pattern/deviations: Decreased stride length;Drifts right/left     General Gait Details: Pt required 1 seated restbreak after stair negotiation secondary to SOB.    Stairs Stairs: Yes   Stair Management: One rail Left;Alternating pattern;Step to pattern;Forwards Number of Stairs: 12 General stair comments: Pt impulsive and began climbing stairs before PTA could  give instructions. Pt ascended reciprocally and descended with step to pattern. Pt very OOB after stair negotiation.  Wheelchair Mobility    Modified Rankin (Stroke Patients Only)       Balance Overall balance assessment: Needs assistance Sitting-balance support: No upper extremity supported;Feet supported Sitting balance-Leahy Scale: Normal     Standing balance support: No upper extremity supported;During functional activity Standing balance-Leahy Scale: Fair                              Cognition Arousal/Alertness: Awake/alert Behavior During Therapy: Impulsive Overall Cognitive Status: No family/caregiver present to determine baseline cognitive functioning                                 General Comments: Pt could not recall having PT yesterday. On arrival to room pt c/o cold, however he was sitting EOB with no blankets or coverings on. Showing some signs of cognitive deficits. Unsure if this is baseline. No family present to determin PLOF      Exercises      General Comments        Pertinent Vitals/Pain Pain Assessment: Faces Faces Pain Scale: Hurts little more Pain Location: "all over" Pain Descriptors / Indicators: Grimacing;Aching Pain Intervention(s): Monitored during session;Limited activity within patient's tolerance    Home Living                      Prior Function  PT Goals (current goals can now be found in the care plan section) Acute Rehab PT Goals Patient Stated Goal: home PT Goal Formulation: With patient Time For Goal Achievement: 01/23/17 Potential to Achieve Goals: Good    Frequency    Min 3X/week      PT Plan Current plan remains appropriate    Co-evaluation              AM-PAC PT "6 Clicks" Daily Activity  Outcome Measure  Difficulty turning over in bed (including adjusting bedclothes, sheets and blankets)?: None Difficulty moving from lying on back to sitting on the side  of the bed? : None Difficulty sitting down on and standing up from a chair with arms (e.g., wheelchair, bedside commode, etc,.)?: A Little Help needed moving to and from a bed to chair (including a wheelchair)?: None Help needed walking in hospital room?: None Help needed climbing 3-5 steps with a railing? : A Little 6 Click Score: 22    End of Session Equipment Utilized During Treatment: Gait belt Activity Tolerance: Patient tolerated treatment well Patient left: in bed;with call bell/phone within reach(sitting EOB) Nurse Communication: Mobility status PT Visit Diagnosis: Difficulty in walking, not elsewhere classified (R26.2);Unsteadiness on feet (R26.81)     Time: 1100-1115 PT Time Calculation (min) (ACUTE ONLY): 15 min  Charges:  $Gait Training: 8-22 mins                    G Codes:       Benjiman Core, [place] Pager [phone] Acute Rehab  Allena Katz 01/17/2017, 11:27 AM

## 2017-01-17 NOTE — Plan of Care (Signed)
Patient complains of chronic pain, generalized, given morphine and tylenol prn.

## 2017-01-19 LAB — CULTURE, BLOOD (ROUTINE X 2)
CULTURE: NO GROWTH
Culture: NO GROWTH
Special Requests: ADEQUATE
Special Requests: ADEQUATE

## 2017-01-22 ENCOUNTER — Other Ambulatory Visit: Payer: Self-pay | Admitting: Cardiology

## 2017-02-01 DIAGNOSIS — I10 Essential (primary) hypertension: Secondary | ICD-10-CM | POA: Diagnosis not present

## 2017-02-01 DIAGNOSIS — Z09 Encounter for follow-up examination after completed treatment for conditions other than malignant neoplasm: Secondary | ICD-10-CM | POA: Diagnosis not present

## 2017-02-07 DIAGNOSIS — R112 Nausea with vomiting, unspecified: Secondary | ICD-10-CM | POA: Diagnosis not present

## 2017-02-07 DIAGNOSIS — F419 Anxiety disorder, unspecified: Secondary | ICD-10-CM | POA: Diagnosis not present

## 2017-02-07 DIAGNOSIS — R197 Diarrhea, unspecified: Secondary | ICD-10-CM | POA: Diagnosis not present

## 2017-02-07 DIAGNOSIS — E860 Dehydration: Secondary | ICD-10-CM | POA: Diagnosis not present

## 2017-02-07 DIAGNOSIS — N12 Tubulo-interstitial nephritis, not specified as acute or chronic: Secondary | ICD-10-CM | POA: Diagnosis not present

## 2017-02-09 DIAGNOSIS — M150 Primary generalized (osteo)arthritis: Secondary | ICD-10-CM | POA: Diagnosis not present

## 2017-02-09 DIAGNOSIS — Z79891 Long term (current) use of opiate analgesic: Secondary | ICD-10-CM | POA: Diagnosis not present

## 2017-02-09 DIAGNOSIS — G894 Chronic pain syndrome: Secondary | ICD-10-CM | POA: Diagnosis not present

## 2017-02-09 DIAGNOSIS — M47812 Spondylosis without myelopathy or radiculopathy, cervical region: Secondary | ICD-10-CM | POA: Diagnosis not present

## 2017-03-08 DIAGNOSIS — Z79899 Other long term (current) drug therapy: Secondary | ICD-10-CM | POA: Diagnosis not present

## 2017-03-08 DIAGNOSIS — M25561 Pain in right knee: Secondary | ICD-10-CM | POA: Diagnosis not present

## 2017-03-08 DIAGNOSIS — M170 Bilateral primary osteoarthritis of knee: Secondary | ICD-10-CM | POA: Diagnosis not present

## 2017-03-08 DIAGNOSIS — M7022 Olecranon bursitis, left elbow: Secondary | ICD-10-CM | POA: Diagnosis not present

## 2017-03-08 DIAGNOSIS — M109 Gout, unspecified: Secondary | ICD-10-CM | POA: Diagnosis not present

## 2017-03-13 DIAGNOSIS — M150 Primary generalized (osteo)arthritis: Secondary | ICD-10-CM | POA: Diagnosis not present

## 2017-03-13 DIAGNOSIS — G894 Chronic pain syndrome: Secondary | ICD-10-CM | POA: Diagnosis not present

## 2017-03-13 DIAGNOSIS — M47812 Spondylosis without myelopathy or radiculopathy, cervical region: Secondary | ICD-10-CM | POA: Diagnosis not present

## 2017-03-13 DIAGNOSIS — Z79891 Long term (current) use of opiate analgesic: Secondary | ICD-10-CM | POA: Diagnosis not present

## 2017-04-11 DIAGNOSIS — M47812 Spondylosis without myelopathy or radiculopathy, cervical region: Secondary | ICD-10-CM | POA: Diagnosis not present

## 2017-04-11 DIAGNOSIS — M150 Primary generalized (osteo)arthritis: Secondary | ICD-10-CM | POA: Diagnosis not present

## 2017-04-11 DIAGNOSIS — Z79891 Long term (current) use of opiate analgesic: Secondary | ICD-10-CM | POA: Diagnosis not present

## 2017-04-11 DIAGNOSIS — G894 Chronic pain syndrome: Secondary | ICD-10-CM | POA: Diagnosis not present

## 2017-04-18 DIAGNOSIS — E7439 Other disorders of intestinal carbohydrate absorption: Secondary | ICD-10-CM | POA: Diagnosis not present

## 2017-04-18 DIAGNOSIS — I10 Essential (primary) hypertension: Secondary | ICD-10-CM | POA: Diagnosis not present

## 2017-04-18 DIAGNOSIS — N183 Chronic kidney disease, stage 3 (moderate): Secondary | ICD-10-CM | POA: Diagnosis not present

## 2017-04-18 DIAGNOSIS — E789 Disorder of lipoprotein metabolism, unspecified: Secondary | ICD-10-CM | POA: Diagnosis not present

## 2017-04-18 DIAGNOSIS — M109 Gout, unspecified: Secondary | ICD-10-CM | POA: Diagnosis not present

## 2017-04-18 DIAGNOSIS — I2510 Atherosclerotic heart disease of native coronary artery without angina pectoris: Secondary | ICD-10-CM | POA: Diagnosis not present

## 2017-04-24 NOTE — Progress Notes (Signed)
Recent Labs: April 18, 2017 Na+ 144, K+ 4.8, Cl- 108, HCO3-20, BUN 12, Cr 1.09, Glu 96, Ca2+ 9.3; CBC: W 6.8, H/H 14.9/45.7, Plt 254; A1c 5.05 November 2016: TC 102, TG 63, HDL 41, LDL 40  Lipids look great.  Kidney function look stable.  Overall normal labs.  Glenetta Hew, MD

## 2017-04-27 ENCOUNTER — Other Ambulatory Visit: Payer: Self-pay | Admitting: Cardiology

## 2017-05-09 DIAGNOSIS — M47812 Spondylosis without myelopathy or radiculopathy, cervical region: Secondary | ICD-10-CM | POA: Diagnosis not present

## 2017-05-09 DIAGNOSIS — Z79891 Long term (current) use of opiate analgesic: Secondary | ICD-10-CM | POA: Diagnosis not present

## 2017-05-09 DIAGNOSIS — M150 Primary generalized (osteo)arthritis: Secondary | ICD-10-CM | POA: Diagnosis not present

## 2017-05-09 DIAGNOSIS — G894 Chronic pain syndrome: Secondary | ICD-10-CM | POA: Diagnosis not present

## 2017-05-11 ENCOUNTER — Ambulatory Visit: Payer: PPO | Admitting: Cardiology

## 2017-05-11 ENCOUNTER — Encounter: Payer: Self-pay | Admitting: Cardiology

## 2017-05-11 VITALS — BP 137/83 | HR 80 | Ht 69.0 in | Wt 272.6 lb

## 2017-05-11 DIAGNOSIS — I2111 ST elevation (STEMI) myocardial infarction involving right coronary artery: Secondary | ICD-10-CM | POA: Diagnosis not present

## 2017-05-11 DIAGNOSIS — E669 Obesity, unspecified: Secondary | ICD-10-CM

## 2017-05-11 DIAGNOSIS — I10 Essential (primary) hypertension: Secondary | ICD-10-CM

## 2017-05-11 DIAGNOSIS — Z716 Tobacco abuse counseling: Secondary | ICD-10-CM

## 2017-05-11 DIAGNOSIS — I2510 Atherosclerotic heart disease of native coronary artery without angina pectoris: Secondary | ICD-10-CM

## 2017-05-11 DIAGNOSIS — E785 Hyperlipidemia, unspecified: Secondary | ICD-10-CM

## 2017-05-11 DIAGNOSIS — Z9861 Coronary angioplasty status: Secondary | ICD-10-CM

## 2017-05-11 DIAGNOSIS — R600 Localized edema: Secondary | ICD-10-CM

## 2017-05-11 DIAGNOSIS — I213 ST elevation (STEMI) myocardial infarction of unspecified site: Secondary | ICD-10-CM | POA: Diagnosis not present

## 2017-05-11 DIAGNOSIS — Z720 Tobacco use: Secondary | ICD-10-CM

## 2017-05-11 MED ORDER — DILTIAZEM HCL ER COATED BEADS 240 MG PO CP24: 240.0000 mg | ORAL_CAPSULE | Freq: Every day | ORAL | 3 refills | Status: DC
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
## 2017-05-11 NOTE — Assessment & Plan Note (Addendum)
PRN Lasix only --> his Lasix along with beta-blocker and ACE inhibitor/ARB were all not restarted after his last hospitalization for renal failure.

## 2017-05-11 NOTE — Patient Instructions (Addendum)
MEDICATIONS INSTRUCTION  STOP TAKING EFFIENT ( PRASUGREL)   CONTINUE TAKING BRILINTA  90 MG TWICE  A DAY   INCREASE DILTIAZEM 240 MG TAKE ONE DAILY   STOP TAKING DILTIAZEM 120MG      Your physician wants you to follow-up in SEPT 2019 [place]. You will receive a reminder letter in the mail two months in advance. If you don't receive a letter, please call our office to schedule the follow-up appointment.    If you need a refill on your cardiac medications before your next appointment, please call your pharmacy.

## 2017-05-13 ENCOUNTER — Encounter: Payer: Self-pay | Admitting: Cardiology

## 2017-05-13 NOTE — Assessment & Plan Note (Signed)
Sizable inferior posterior infarction noted on EKG, but EF relatively well-preserved.  No cardiomyopathy as a result.    he seems to be recovered from his MI, and is happy to note that the arm discomfort that he was having before is no longer there.  Sounds like his angina is pretty well controlled.  Off and on nitroglycerin but nothing  sustained, or consistent.  He also really did not have any other culprit target lesions.

## 2017-05-13 NOTE — Assessment & Plan Note (Signed)
Chantix when he has episodes of extreme craving, but seems to have been able to abstain for the better part of the year.

## 2017-05-13 NOTE — Assessment & Plan Note (Signed)
Well-controlled.  He reports that he has been out of it for a week, we will simply refill his rosuvastatin.  Seems to be doing well without a lot of myalgias.

## 2017-05-13 NOTE — Assessment & Plan Note (Signed)
Blood pressure looks pretty good, will simply increase diltiazem to 240 mg daily and hold off on restarting ACE inhibitor/ARB and and/or beta-blocker

## 2017-05-13 NOTE — Assessment & Plan Note (Signed)
Distal bare-metal stent PCI to the RCA followed by LAD in the early 2000's.  Now with recent inferior STEMI from in-stent thrombosis/late late loss of the RCA bare-metal stent.  Covered with a long DES stent. He had been initially I guess on aspirin and Effient, but now is on Brilinta.  My recommendation will be to continue aspirin plus Brilinta for now on for at least the first year.  I would then stop aspirin and continue with maintenance dose Brilinta.  He was on lots of medicines prior to his hospitalization for diarrhea, both of those were all stopped.  He is now only on diltiazem.  Since his weight is relatively controlled at this point.  I will simply just things easy for him and increase his diltiazem dose and hold off on the beta-blocker and ACE inhibitor.  There is concern against for hypotension related acute renal insufficiency. Not restarting the beta-blocker may help with some of the fatigue.  Continue rosuvastatin which was refilled today.

## 2017-05-15 ENCOUNTER — Telehealth: Payer: Self-pay | Admitting: Cardiology

## 2017-05-15 NOTE — Telephone Encounter (Signed)
Returned call to patient he stated he needs to have 18 teeth pulled.Stated he does not know name of dentist yet.Advised dentist will send [place] a clearance before he pulls teeth.

## 2017-05-15 NOTE — Telephone Encounter (Signed)
Patient calling, states that he going to have some teeth extractions and needs a letter to take with him,. Patient states that he does not know which dentist it will or when because he has to find a provider that will accept his insurance. I tried to explain preop process but patient would like to discuss getting letter.

## 2017-05-18 DIAGNOSIS — Z79899 Other long term (current) drug therapy: Secondary | ICD-10-CM | POA: Diagnosis not present

## 2017-05-18 DIAGNOSIS — M7022 Olecranon bursitis, left elbow: Secondary | ICD-10-CM | POA: Diagnosis not present

## 2017-05-18 DIAGNOSIS — M25561 Pain in right knee: Secondary | ICD-10-CM | POA: Diagnosis not present

## 2017-05-18 DIAGNOSIS — M170 Bilateral primary osteoarthritis of knee: Secondary | ICD-10-CM | POA: Diagnosis not present

## 2017-05-18 DIAGNOSIS — M109 Gout, unspecified: Secondary | ICD-10-CM | POA: Diagnosis not present

## 2017-05-24 ENCOUNTER — Other Ambulatory Visit: Payer: Self-pay | Admitting: Cardiology

## 2017-06-07 DIAGNOSIS — Z79891 Long term (current) use of opiate analgesic: Secondary | ICD-10-CM | POA: Diagnosis not present

## 2017-06-07 DIAGNOSIS — M150 Primary generalized (osteo)arthritis: Secondary | ICD-10-CM | POA: Diagnosis not present

## 2017-06-07 DIAGNOSIS — G894 Chronic pain syndrome: Secondary | ICD-10-CM | POA: Diagnosis not present

## 2017-06-07 DIAGNOSIS — B029 Zoster without complications: Secondary | ICD-10-CM | POA: Diagnosis not present

## 2017-06-19 ENCOUNTER — Other Ambulatory Visit: Payer: Self-pay | Admitting: Cardiology

## 2017-07-04 DIAGNOSIS — M150 Primary generalized (osteo)arthritis: Secondary | ICD-10-CM | POA: Diagnosis not present

## 2017-07-04 DIAGNOSIS — B029 Zoster without complications: Secondary | ICD-10-CM | POA: Diagnosis not present

## 2017-07-04 DIAGNOSIS — G894 Chronic pain syndrome: Secondary | ICD-10-CM | POA: Diagnosis not present

## 2017-07-04 DIAGNOSIS — Z79891 Long term (current) use of opiate analgesic: Secondary | ICD-10-CM | POA: Diagnosis not present

## 2017-07-18 ENCOUNTER — Other Ambulatory Visit: Payer: Self-pay | Admitting: Cardiology

## 2017-07-19 ENCOUNTER — Telehealth: Payer: Self-pay | Admitting: Cardiology

## 2017-07-19 NOTE — Telephone Encounter (Signed)
RN spoke to pharmacist Siva-  Medication- generic  EFFIENT in question was discontinue in 05/11/17. PATIENT  Was to continue wit Brilinta 90 mg  Twice a day . pharmacist state the Brilinta is on  Patient'S profile.  Pharmacist will discontinue effient from patient profile.

## 2017-07-19 NOTE — Telephone Encounter (Signed)
New Message:       Pt c/o medication issue:  1. Name of Medication: Prasugrel  2. How are you currently taking this medication (dosage and times per day)? 10 mg  3. Are you having a reaction (difficulty breathing--STAT)? No  4. What is your medication issue? [facility] calling and states they have been trying to refill this prescription for pt since feb. The last request they sent over for pt was on 07/09/17. Fax number for them is [phone]

## 2017-07-24 DIAGNOSIS — I10 Essential (primary) hypertension: Secondary | ICD-10-CM | POA: Diagnosis not present

## 2017-07-24 DIAGNOSIS — J449 Chronic obstructive pulmonary disease, unspecified: Secondary | ICD-10-CM | POA: Diagnosis not present

## 2017-07-24 DIAGNOSIS — K219 Gastro-esophageal reflux disease without esophagitis: Secondary | ICD-10-CM | POA: Diagnosis not present

## 2017-07-24 DIAGNOSIS — Z79899 Other long term (current) drug therapy: Secondary | ICD-10-CM | POA: Diagnosis not present

## 2017-07-24 DIAGNOSIS — F419 Anxiety disorder, unspecified: Secondary | ICD-10-CM | POA: Diagnosis not present

## 2017-07-24 DIAGNOSIS — E789 Disorder of lipoprotein metabolism, unspecified: Secondary | ICD-10-CM | POA: Diagnosis not present

## 2017-07-24 DIAGNOSIS — M109 Gout, unspecified: Secondary | ICD-10-CM | POA: Diagnosis not present

## 2017-08-07 DIAGNOSIS — Z79891 Long term (current) use of opiate analgesic: Secondary | ICD-10-CM | POA: Diagnosis not present

## 2017-08-07 DIAGNOSIS — G894 Chronic pain syndrome: Secondary | ICD-10-CM | POA: Diagnosis not present

## 2017-08-07 DIAGNOSIS — M150 Primary generalized (osteo)arthritis: Secondary | ICD-10-CM | POA: Diagnosis not present

## 2017-08-07 DIAGNOSIS — M47812 Spondylosis without myelopathy or radiculopathy, cervical region: Secondary | ICD-10-CM | POA: Diagnosis not present

## 2017-08-23 ENCOUNTER — Telehealth: Payer: Self-pay | Admitting: *Deleted

## 2017-08-23 NOTE — Telephone Encounter (Signed)
Dr. Ellyn Hack, this patient has hx of Inferior STEMI in May 0165 (complicated by NSVT).  100% mid RCA -- Aspiration Thrombectomy & DES PCI -- RCAaspiration thrombectomy with DES stent placement in the RCA he did have a brief episode of non-sustained V. Tach.  He is planned for full mouth extraction- 10 surgical & 8 simple.   They are requesting to hold Brilinta and if OK, how long?  I don't think he needs antibiotic proph. Can they use epinephrine?  Please route response back to CV DIV PREOP  Thank you

## 2017-08-23 NOTE — Telephone Encounter (Signed)
I talked to his oral surgeon.  Okay to hold Brilinta.  Glenetta Hew, MD

## 2017-08-23 NOTE — Telephone Encounter (Signed)
Patient dropped form off  by office on 08/22/17.  PRIMARY CARDIOLOGIST :DR HARDING.      [facility] Pre-operative Risk Assessment    Request for surgical clearance:  1. What type of surgery is being performed?  FULL MOTH EXTRACTIONS 10 SURGICAL ,8 SIMPLE  2. When is this surgery scheduled? TBD   3. What type of clearance is required (medical clearance vs. Pharmacy clearance to hold med vs. Both)? MEDICAL  (MEDICAL RISKS WITH RECOMMENDED TX PLAN--- LOW _0  ,MODERATE_1  OR HIGH_2  )  Are there any medications that need to be held prior to surgery and how long?  1)  BRILINTA   2) IS ANTIBIOTIC PROPHYLAXIS                  NEEDED?  3) ARE THERE ANY     CONTRAINDICATIONS TO USE  EPINEPHRINE?  4. Practice name and name of physician performing surgery? [facility]   ; DR Larkin Ina McKINLAY,DDS  5. What is your office phone number [phone]   7.   What is your office fax number [phone]  8.   Anesthesia type (None, local, MAC, general) ? Leonia Corona 08/23/2017, 9:56 AM  _________________________________________________________________   (provider comments below)

## 2017-08-24 NOTE — Telephone Encounter (Signed)
This conversation has been efaxed to the requesting provider's office. Will remove from preop pool.

## 2017-08-30 ENCOUNTER — Other Ambulatory Visit: Payer: Self-pay | Admitting: Cardiology

## 2017-09-06 DIAGNOSIS — M150 Primary generalized (osteo)arthritis: Secondary | ICD-10-CM | POA: Diagnosis not present

## 2017-09-06 DIAGNOSIS — G894 Chronic pain syndrome: Secondary | ICD-10-CM | POA: Diagnosis not present

## 2017-09-06 DIAGNOSIS — M47812 Spondylosis without myelopathy or radiculopathy, cervical region: Secondary | ICD-10-CM | POA: Diagnosis not present

## 2017-09-06 DIAGNOSIS — Z79891 Long term (current) use of opiate analgesic: Secondary | ICD-10-CM | POA: Diagnosis not present

## 2017-10-09 DIAGNOSIS — Z79891 Long term (current) use of opiate analgesic: Secondary | ICD-10-CM | POA: Diagnosis not present

## 2017-10-09 DIAGNOSIS — G894 Chronic pain syndrome: Secondary | ICD-10-CM | POA: Diagnosis not present

## 2017-10-09 DIAGNOSIS — M150 Primary generalized (osteo)arthritis: Secondary | ICD-10-CM | POA: Diagnosis not present

## 2017-10-09 DIAGNOSIS — M47812 Spondylosis without myelopathy or radiculopathy, cervical region: Secondary | ICD-10-CM | POA: Diagnosis not present

## 2017-10-17 ENCOUNTER — Other Ambulatory Visit: Payer: Self-pay | Admitting: Student

## 2017-10-23 DIAGNOSIS — R110 Nausea: Secondary | ICD-10-CM | POA: Diagnosis not present

## 2017-10-23 DIAGNOSIS — E789 Disorder of lipoprotein metabolism, unspecified: Secondary | ICD-10-CM | POA: Diagnosis not present

## 2017-10-23 DIAGNOSIS — Z79899 Other long term (current) drug therapy: Secondary | ICD-10-CM | POA: Diagnosis not present

## 2017-10-23 DIAGNOSIS — J449 Chronic obstructive pulmonary disease, unspecified: Secondary | ICD-10-CM | POA: Diagnosis not present

## 2017-10-23 DIAGNOSIS — I10 Essential (primary) hypertension: Secondary | ICD-10-CM | POA: Diagnosis not present

## 2017-10-23 DIAGNOSIS — F419 Anxiety disorder, unspecified: Secondary | ICD-10-CM | POA: Diagnosis not present

## 2017-11-08 DIAGNOSIS — M47812 Spondylosis without myelopathy or radiculopathy, cervical region: Secondary | ICD-10-CM | POA: Diagnosis not present

## 2017-11-08 DIAGNOSIS — G894 Chronic pain syndrome: Secondary | ICD-10-CM | POA: Diagnosis not present

## 2017-11-08 DIAGNOSIS — M150 Primary generalized (osteo)arthritis: Secondary | ICD-10-CM | POA: Diagnosis not present

## 2017-11-08 DIAGNOSIS — Z79891 Long term (current) use of opiate analgesic: Secondary | ICD-10-CM | POA: Diagnosis not present

## 2017-12-06 DIAGNOSIS — M47812 Spondylosis without myelopathy or radiculopathy, cervical region: Secondary | ICD-10-CM | POA: Diagnosis not present

## 2017-12-06 DIAGNOSIS — Z79891 Long term (current) use of opiate analgesic: Secondary | ICD-10-CM | POA: Diagnosis not present

## 2017-12-06 DIAGNOSIS — M150 Primary generalized (osteo)arthritis: Secondary | ICD-10-CM | POA: Diagnosis not present

## 2017-12-06 DIAGNOSIS — G894 Chronic pain syndrome: Secondary | ICD-10-CM | POA: Diagnosis not present

## 2017-12-15 ENCOUNTER — Encounter: Payer: Self-pay | Admitting: Cardiology

## 2017-12-15 ENCOUNTER — Ambulatory Visit: Payer: PPO | Admitting: Cardiology

## 2017-12-15 VITALS — BP 139/88 | HR 65 | Ht 68.0 in | Wt 261.6 lb

## 2017-12-15 DIAGNOSIS — I10 Essential (primary) hypertension: Secondary | ICD-10-CM | POA: Diagnosis not present

## 2017-12-15 DIAGNOSIS — I213 ST elevation (STEMI) myocardial infarction of unspecified site: Secondary | ICD-10-CM

## 2017-12-15 DIAGNOSIS — E785 Hyperlipidemia, unspecified: Secondary | ICD-10-CM

## 2017-12-15 DIAGNOSIS — Z716 Tobacco abuse counseling: Secondary | ICD-10-CM | POA: Diagnosis not present

## 2017-12-15 DIAGNOSIS — R600 Localized edema: Secondary | ICD-10-CM

## 2017-12-15 DIAGNOSIS — I952 Hypotension due to drugs: Secondary | ICD-10-CM | POA: Diagnosis not present

## 2017-12-15 DIAGNOSIS — I2510 Atherosclerotic heart disease of native coronary artery without angina pectoris: Secondary | ICD-10-CM | POA: Diagnosis not present

## 2017-12-15 DIAGNOSIS — Z9861 Coronary angioplasty status: Secondary | ICD-10-CM | POA: Diagnosis not present

## 2017-12-15 MED ORDER — METOPROLOL TARTRATE 25 MG PO TABS: 25.0000 mg | ORAL_TABLET | Freq: Two times a day (BID) | ORAL | 3 refills | Status: DC

## 2017-12-15 MED ORDER — NITROGLYCERIN 0.4 MG SL SUBL: 0.4000 mg | SUBLINGUAL_TABLET | SUBLINGUAL | 11 refills | Status: DC | PRN

## 2017-12-15 MED ORDER — TICAGRELOR 60 MG PO TABS: 60.0000 mg | ORAL_TABLET | Freq: Two times a day (BID) | ORAL | 3 refills | Status: DC
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
## 2017-12-15 NOTE — Patient Instructions (Addendum)
Medication Instructions:    STOP TAKING ASPIRIN 81 MG    IF YOU HAVE CHEST PAIN , TAKE 4 TABLET OF ASPIRIN - CHEW THEM  AND USE NITROGLYCERIN TAB  START TAKING METOPROLOL TARTRATE 12.5 MG ( 1/2 TABLET OF 25 MG) TWICE A DAY.  DECREASE BRILINTA 60 MG TWICE A DAY AFTER YOU COMPLETE THE BOTTLE OF 90 MG BRILITINA YOU HAVE NOW.  CONITNUE USE LASIX( FUROSEMIDE) AS NEEDED FOR EXTRA SWELLING.   If you need a refill on your cardiac medications before your next appointment, please call your pharmacy.     Lab work: NOT NEEDED  If you have labs (blood work) drawn today and your tests are completely normal, you will receive your results only by: [name] MyChart Message (if you have MyChart) OR . A paper copy in the mail If you have any lab test that is abnormal or we need to change your treatment, we will call you to review the results.  Testing/Procedures:  NOT NEEDED   Follow-Up: At [facility], you and your health needs are our priority.  As part of our continuing mission to provide you with exceptional heart care, we have created designated Provider Care Teams.  These Care Teams include your primary Cardiologist (physician) and Advanced Practice Providers (APPs -  Physician Assistants and Nurse Practitioners) who all work together to provide you with the care you need, when you need it. . Your physician recommends that you schedule a follow-up appointment in [place].    You will need to call two months in advance  to schedule the follow-up appointment.   Any Other Special Instructions Will Be Listed Below (If Applicable).

## 2017-12-17 ENCOUNTER — Encounter: Payer: Self-pay | Admitting: Cardiology

## 2017-12-17 NOTE — Assessment & Plan Note (Signed)
Doing very well now more than a year out from smoking.  He still has Chantix that he will take if he starts having cravings but otherwise is actually stable.

## 2017-12-17 NOTE — Assessment & Plan Note (Signed)
Only using PRN Lasix, not even listed now.

## 2017-12-17 NOTE — Assessment & Plan Note (Signed)
More than 1 year out status post sizable inferior posterior infarct with occluded RCA.  Thankfully echocardiograph lead no significant wall motion abnormality or reduced EF. No evidence of any heart failure symptoms but does have some intermittent episodes of what may be angina.  Does not really have any other target culprit lesions to explain his symptoms.  Could be microvascular disease. If persists, adding Imdur may be reasonable.

## 2017-12-17 NOTE — Assessment & Plan Note (Signed)
Symptomatic hypotension.  No ACE inhibitor or ARB added.  Was taking once daily metoprolol, we will reduce to 1/2 tablet twice daily.

## 2017-12-17 NOTE — Assessment & Plan Note (Signed)
Has had issues with hypotension.  Blood pressures currently in the most upper range of normal. States he has had some issues with dizziness with 25 twice daily metoprolol will reduce it to 12.5 mg twice daily and continue diltiazem. Hold off on statin starting ACE inhibitor/ARB.

## 2017-12-17 NOTE — Assessment & Plan Note (Signed)
Excellent control still on rosuvastatin.  Doing better without any allergies using rosuvastatin instead of atorvastatin.

## 2017-12-17 NOTE — Assessment & Plan Note (Signed)
Distant history of BMS PCI to the RCA and LAD back in the 2000's.  Most recently with an inferoposterior STEMI in May 2018 -late late loss of RCA BMS. -->  This was then covered with a longer DES stent. Initially on Effient, switched to Brilinta.  Intermittently having chest discomfort but not with any routine at all.  Plan: DC aspirin, reduce split Brilinta to 60 mg twice daily because of some bleeding issues he is having  Although metoprolol was not listed, he did have a prescription and was taking 25 mg daily.  I will switch it to 12.5 twice daily and continue diltiazem for antianginal effect.  If he is indeed having to take his nitroglycerin on a more frequent basis, I will add Imdur.  Continue statin

## 2017-12-17 NOTE — Assessment & Plan Note (Signed)
Talked about the importance of really try to get exercise and watch his diet. If he still has trouble losing weight at least back down to but he was before stopping smoking, I would probably recommend referral to patient coaching with nutrition counseling etc.

## 2018-01-08 DIAGNOSIS — M47812 Spondylosis without myelopathy or radiculopathy, cervical region: Secondary | ICD-10-CM | POA: Diagnosis not present

## 2018-01-08 DIAGNOSIS — G894 Chronic pain syndrome: Secondary | ICD-10-CM | POA: Diagnosis not present

## 2018-01-08 DIAGNOSIS — Z79891 Long term (current) use of opiate analgesic: Secondary | ICD-10-CM | POA: Diagnosis not present

## 2018-01-08 DIAGNOSIS — M150 Primary generalized (osteo)arthritis: Secondary | ICD-10-CM | POA: Diagnosis not present

## 2018-01-23 DIAGNOSIS — I10 Essential (primary) hypertension: Secondary | ICD-10-CM | POA: Diagnosis not present

## 2018-01-23 DIAGNOSIS — Z23 Encounter for immunization: Secondary | ICD-10-CM | POA: Diagnosis not present

## 2018-01-23 DIAGNOSIS — F419 Anxiety disorder, unspecified: Secondary | ICD-10-CM | POA: Diagnosis not present

## 2018-01-23 DIAGNOSIS — Z79899 Other long term (current) drug therapy: Secondary | ICD-10-CM | POA: Diagnosis not present

## 2018-01-23 DIAGNOSIS — E789 Disorder of lipoprotein metabolism, unspecified: Secondary | ICD-10-CM | POA: Diagnosis not present

## 2018-01-23 DIAGNOSIS — J449 Chronic obstructive pulmonary disease, unspecified: Secondary | ICD-10-CM | POA: Diagnosis not present

## 2018-02-07 DIAGNOSIS — G894 Chronic pain syndrome: Secondary | ICD-10-CM | POA: Diagnosis not present

## 2018-02-07 DIAGNOSIS — Z79891 Long term (current) use of opiate analgesic: Secondary | ICD-10-CM | POA: Diagnosis not present

## 2018-02-07 DIAGNOSIS — M47812 Spondylosis without myelopathy or radiculopathy, cervical region: Secondary | ICD-10-CM | POA: Diagnosis not present

## 2018-02-07 DIAGNOSIS — M150 Primary generalized (osteo)arthritis: Secondary | ICD-10-CM | POA: Diagnosis not present

## 2018-03-08 DIAGNOSIS — Z79891 Long term (current) use of opiate analgesic: Secondary | ICD-10-CM | POA: Diagnosis not present

## 2018-03-08 DIAGNOSIS — M47812 Spondylosis without myelopathy or radiculopathy, cervical region: Secondary | ICD-10-CM | POA: Diagnosis not present

## 2018-03-08 DIAGNOSIS — G894 Chronic pain syndrome: Secondary | ICD-10-CM | POA: Diagnosis not present

## 2018-03-08 DIAGNOSIS — M150 Primary generalized (osteo)arthritis: Secondary | ICD-10-CM | POA: Diagnosis not present

## 2018-04-10 DIAGNOSIS — M47812 Spondylosis without myelopathy or radiculopathy, cervical region: Secondary | ICD-10-CM | POA: Diagnosis not present

## 2018-04-10 DIAGNOSIS — M150 Primary generalized (osteo)arthritis: Secondary | ICD-10-CM | POA: Diagnosis not present

## 2018-04-10 DIAGNOSIS — Z79891 Long term (current) use of opiate analgesic: Secondary | ICD-10-CM | POA: Diagnosis not present

## 2018-04-10 DIAGNOSIS — G894 Chronic pain syndrome: Secondary | ICD-10-CM | POA: Diagnosis not present

## 2018-04-25 DIAGNOSIS — M109 Gout, unspecified: Secondary | ICD-10-CM | POA: Diagnosis not present

## 2018-04-25 DIAGNOSIS — E789 Disorder of lipoprotein metabolism, unspecified: Secondary | ICD-10-CM | POA: Diagnosis not present

## 2018-04-25 DIAGNOSIS — H1045 Other chronic allergic conjunctivitis: Secondary | ICD-10-CM | POA: Diagnosis not present

## 2018-04-25 DIAGNOSIS — Z0000 Encounter for general adult medical examination without abnormal findings: Secondary | ICD-10-CM | POA: Diagnosis not present

## 2018-04-25 DIAGNOSIS — J449 Chronic obstructive pulmonary disease, unspecified: Secondary | ICD-10-CM | POA: Diagnosis not present

## 2018-04-25 DIAGNOSIS — F419 Anxiety disorder, unspecified: Secondary | ICD-10-CM | POA: Diagnosis not present

## 2018-04-25 DIAGNOSIS — Z125 Encounter for screening for malignant neoplasm of prostate: Secondary | ICD-10-CM | POA: Diagnosis not present

## 2018-04-25 DIAGNOSIS — I10 Essential (primary) hypertension: Secondary | ICD-10-CM | POA: Diagnosis not present

## 2018-05-09 DIAGNOSIS — G894 Chronic pain syndrome: Secondary | ICD-10-CM | POA: Diagnosis not present

## 2018-05-09 DIAGNOSIS — M47812 Spondylosis without myelopathy or radiculopathy, cervical region: Secondary | ICD-10-CM | POA: Diagnosis not present

## 2018-05-09 DIAGNOSIS — M150 Primary generalized (osteo)arthritis: Secondary | ICD-10-CM | POA: Diagnosis not present

## 2018-05-09 DIAGNOSIS — Z79891 Long term (current) use of opiate analgesic: Secondary | ICD-10-CM | POA: Diagnosis not present

## 2018-06-04 ENCOUNTER — Other Ambulatory Visit: Payer: Self-pay | Admitting: Cardiology

## 2018-06-07 DIAGNOSIS — M722 Plantar fascial fibromatosis: Secondary | ICD-10-CM | POA: Diagnosis not present

## 2018-06-07 DIAGNOSIS — M6702 Short Achilles tendon (acquired), left ankle: Secondary | ICD-10-CM | POA: Diagnosis not present

## 2018-06-07 DIAGNOSIS — R0602 Shortness of breath: Secondary | ICD-10-CM | POA: Diagnosis not present

## 2018-06-07 DIAGNOSIS — I482 Chronic atrial fibrillation, unspecified: Secondary | ICD-10-CM | POA: Diagnosis not present

## 2018-06-07 DIAGNOSIS — H52202 Unspecified astigmatism, left eye: Secondary | ICD-10-CM | POA: Diagnosis not present

## 2018-06-07 DIAGNOSIS — I4891 Unspecified atrial fibrillation: Secondary | ICD-10-CM | POA: Diagnosis not present

## 2018-06-07 DIAGNOSIS — F17200 Nicotine dependence, unspecified, uncomplicated: Secondary | ICD-10-CM | POA: Diagnosis not present

## 2018-06-07 DIAGNOSIS — M47812 Spondylosis without myelopathy or radiculopathy, cervical region: Secondary | ICD-10-CM | POA: Diagnosis not present

## 2018-06-07 DIAGNOSIS — Z01419 Encounter for gynecological examination (general) (routine) without abnormal findings: Secondary | ICD-10-CM | POA: Diagnosis not present

## 2018-06-07 DIAGNOSIS — Z79891 Long term (current) use of opiate analgesic: Secondary | ICD-10-CM | POA: Diagnosis not present

## 2018-06-07 DIAGNOSIS — G894 Chronic pain syndrome: Secondary | ICD-10-CM | POA: Diagnosis not present

## 2018-06-07 DIAGNOSIS — D538 Other specified nutritional anemias: Secondary | ICD-10-CM | POA: Diagnosis not present

## 2018-06-07 DIAGNOSIS — Z1231 Encounter for screening mammogram for malignant neoplasm of breast: Secondary | ICD-10-CM | POA: Diagnosis not present

## 2018-06-07 DIAGNOSIS — R609 Edema, unspecified: Secondary | ICD-10-CM | POA: Diagnosis not present

## 2018-06-07 DIAGNOSIS — I10 Essential (primary) hypertension: Secondary | ICD-10-CM | POA: Diagnosis not present

## 2018-06-07 DIAGNOSIS — F5104 Psychophysiologic insomnia: Secondary | ICD-10-CM | POA: Diagnosis not present

## 2018-06-07 DIAGNOSIS — M150 Primary generalized (osteo)arthritis: Secondary | ICD-10-CM | POA: Diagnosis not present

## 2018-06-07 DIAGNOSIS — Z79899 Other long term (current) drug therapy: Secondary | ICD-10-CM | POA: Diagnosis not present

## 2018-06-07 DIAGNOSIS — E782 Mixed hyperlipidemia: Secondary | ICD-10-CM | POA: Diagnosis not present

## 2018-06-07 DIAGNOSIS — R0902 Hypoxemia: Secondary | ICD-10-CM | POA: Diagnosis not present

## 2018-06-07 DIAGNOSIS — H25812 Combined forms of age-related cataract, left eye: Secondary | ICD-10-CM | POA: Diagnosis not present

## 2018-06-07 DIAGNOSIS — R600 Localized edema: Secondary | ICD-10-CM | POA: Diagnosis not present

## 2018-06-07 DIAGNOSIS — Z7982 Long term (current) use of aspirin: Secondary | ICD-10-CM | POA: Diagnosis not present

## 2018-06-07 DIAGNOSIS — K219 Gastro-esophageal reflux disease without esophagitis: Secondary | ICD-10-CM | POA: Diagnosis not present

## 2018-06-07 DIAGNOSIS — M25631 Stiffness of right wrist, not elsewhere classified: Secondary | ICD-10-CM | POA: Diagnosis not present

## 2018-06-07 DIAGNOSIS — Z23 Encounter for immunization: Secondary | ICD-10-CM | POA: Diagnosis not present

## 2018-06-07 DIAGNOSIS — K449 Diaphragmatic hernia without obstruction or gangrene: Secondary | ICD-10-CM | POA: Diagnosis not present

## 2018-06-07 DIAGNOSIS — S52541A Smith's fracture of right radius, initial encounter for closed fracture: Secondary | ICD-10-CM | POA: Diagnosis not present

## 2018-06-07 DIAGNOSIS — Z90710 Acquired absence of both cervix and uterus: Secondary | ICD-10-CM | POA: Diagnosis not present

## 2018-06-07 DIAGNOSIS — R531 Weakness: Secondary | ICD-10-CM | POA: Diagnosis not present

## 2018-07-02 DIAGNOSIS — C4442 Squamous cell carcinoma of skin of scalp and neck: Secondary | ICD-10-CM | POA: Diagnosis not present

## 2018-07-09 DIAGNOSIS — G894 Chronic pain syndrome: Secondary | ICD-10-CM | POA: Diagnosis not present

## 2018-07-09 DIAGNOSIS — M47812 Spondylosis without myelopathy or radiculopathy, cervical region: Secondary | ICD-10-CM | POA: Diagnosis not present

## 2018-07-09 DIAGNOSIS — M150 Primary generalized (osteo)arthritis: Secondary | ICD-10-CM | POA: Diagnosis not present

## 2018-07-09 DIAGNOSIS — Z79891 Long term (current) use of opiate analgesic: Secondary | ICD-10-CM | POA: Diagnosis not present

## 2018-07-25 DIAGNOSIS — M109 Gout, unspecified: Secondary | ICD-10-CM | POA: Diagnosis not present

## 2018-07-25 DIAGNOSIS — I2510 Atherosclerotic heart disease of native coronary artery without angina pectoris: Secondary | ICD-10-CM | POA: Diagnosis not present

## 2018-07-25 DIAGNOSIS — Z125 Encounter for screening for malignant neoplasm of prostate: Secondary | ICD-10-CM | POA: Diagnosis not present

## 2018-07-25 DIAGNOSIS — J449 Chronic obstructive pulmonary disease, unspecified: Secondary | ICD-10-CM | POA: Diagnosis not present

## 2018-07-25 DIAGNOSIS — Z79899 Other long term (current) drug therapy: Secondary | ICD-10-CM | POA: Diagnosis not present

## 2018-07-25 DIAGNOSIS — G43009 Migraine without aura, not intractable, without status migrainosus: Secondary | ICD-10-CM | POA: Diagnosis not present

## 2018-07-25 DIAGNOSIS — E789 Disorder of lipoprotein metabolism, unspecified: Secondary | ICD-10-CM | POA: Diagnosis not present

## 2018-07-25 DIAGNOSIS — N183 Chronic kidney disease, stage 3 (moderate): Secondary | ICD-10-CM | POA: Diagnosis not present

## 2018-07-25 DIAGNOSIS — K219 Gastro-esophageal reflux disease without esophagitis: Secondary | ICD-10-CM | POA: Diagnosis not present

## 2018-07-25 DIAGNOSIS — R739 Hyperglycemia, unspecified: Secondary | ICD-10-CM | POA: Diagnosis not present

## 2018-07-25 DIAGNOSIS — F419 Anxiety disorder, unspecified: Secondary | ICD-10-CM | POA: Diagnosis not present

## 2018-07-25 DIAGNOSIS — I10 Essential (primary) hypertension: Secondary | ICD-10-CM | POA: Diagnosis not present

## 2018-07-25 DIAGNOSIS — M797 Fibromyalgia: Secondary | ICD-10-CM | POA: Diagnosis not present

## 2018-08-01 DIAGNOSIS — Z85828 Personal history of other malignant neoplasm of skin: Secondary | ICD-10-CM | POA: Diagnosis not present

## 2018-08-01 DIAGNOSIS — Z08 Encounter for follow-up examination after completed treatment for malignant neoplasm: Secondary | ICD-10-CM | POA: Diagnosis not present

## 2018-08-09 DIAGNOSIS — G894 Chronic pain syndrome: Secondary | ICD-10-CM | POA: Diagnosis not present

## 2018-08-09 DIAGNOSIS — M150 Primary generalized (osteo)arthritis: Secondary | ICD-10-CM | POA: Diagnosis not present

## 2018-08-09 DIAGNOSIS — M47812 Spondylosis without myelopathy or radiculopathy, cervical region: Secondary | ICD-10-CM | POA: Diagnosis not present

## 2018-08-09 DIAGNOSIS — Z79891 Long term (current) use of opiate analgesic: Secondary | ICD-10-CM | POA: Diagnosis not present

## 2018-09-04 ENCOUNTER — Other Ambulatory Visit: Payer: Self-pay | Admitting: Cardiology

## 2018-09-06 DIAGNOSIS — M150 Primary generalized (osteo)arthritis: Secondary | ICD-10-CM | POA: Diagnosis not present

## 2018-09-06 DIAGNOSIS — Z79891 Long term (current) use of opiate analgesic: Secondary | ICD-10-CM | POA: Diagnosis not present

## 2018-09-06 DIAGNOSIS — M47812 Spondylosis without myelopathy or radiculopathy, cervical region: Secondary | ICD-10-CM | POA: Diagnosis not present

## 2018-09-06 DIAGNOSIS — G894 Chronic pain syndrome: Secondary | ICD-10-CM | POA: Diagnosis not present

## 2018-09-29 ENCOUNTER — Other Ambulatory Visit: Payer: Self-pay | Admitting: Cardiology

## 2018-10-08 DIAGNOSIS — Z79891 Long term (current) use of opiate analgesic: Secondary | ICD-10-CM | POA: Diagnosis not present

## 2018-10-08 DIAGNOSIS — G894 Chronic pain syndrome: Secondary | ICD-10-CM | POA: Diagnosis not present

## 2018-10-08 DIAGNOSIS — M150 Primary generalized (osteo)arthritis: Secondary | ICD-10-CM | POA: Diagnosis not present

## 2018-10-08 DIAGNOSIS — L239 Allergic contact dermatitis, unspecified cause: Secondary | ICD-10-CM | POA: Diagnosis not present

## 2018-10-25 DIAGNOSIS — Z79899 Other long term (current) drug therapy: Secondary | ICD-10-CM | POA: Diagnosis not present

## 2018-10-25 DIAGNOSIS — R739 Hyperglycemia, unspecified: Secondary | ICD-10-CM | POA: Diagnosis not present

## 2018-10-25 DIAGNOSIS — I2510 Atherosclerotic heart disease of native coronary artery without angina pectoris: Secondary | ICD-10-CM | POA: Diagnosis not present

## 2018-10-25 DIAGNOSIS — E785 Hyperlipidemia, unspecified: Secondary | ICD-10-CM | POA: Diagnosis not present

## 2018-10-25 DIAGNOSIS — F419 Anxiety disorder, unspecified: Secondary | ICD-10-CM | POA: Diagnosis not present

## 2018-10-25 DIAGNOSIS — Z0000 Encounter for general adult medical examination without abnormal findings: Secondary | ICD-10-CM | POA: Diagnosis not present

## 2018-10-25 DIAGNOSIS — E7800 Pure hypercholesterolemia, unspecified: Secondary | ICD-10-CM | POA: Diagnosis not present

## 2018-10-25 DIAGNOSIS — I10 Essential (primary) hypertension: Secondary | ICD-10-CM | POA: Diagnosis not present

## 2018-11-07 DIAGNOSIS — Z79891 Long term (current) use of opiate analgesic: Secondary | ICD-10-CM | POA: Diagnosis not present

## 2018-11-07 DIAGNOSIS — G894 Chronic pain syndrome: Secondary | ICD-10-CM | POA: Diagnosis not present

## 2018-11-07 DIAGNOSIS — M150 Primary generalized (osteo)arthritis: Secondary | ICD-10-CM | POA: Diagnosis not present

## 2018-11-07 DIAGNOSIS — L239 Allergic contact dermatitis, unspecified cause: Secondary | ICD-10-CM | POA: Diagnosis not present

## 2018-12-06 ENCOUNTER — Other Ambulatory Visit: Payer: Self-pay | Admitting: Internal Medicine

## 2018-12-06 DIAGNOSIS — Z79891 Long term (current) use of opiate analgesic: Secondary | ICD-10-CM | POA: Diagnosis not present

## 2018-12-06 DIAGNOSIS — M47812 Spondylosis without myelopathy or radiculopathy, cervical region: Secondary | ICD-10-CM | POA: Diagnosis not present

## 2018-12-06 DIAGNOSIS — G894 Chronic pain syndrome: Secondary | ICD-10-CM | POA: Diagnosis not present

## 2018-12-06 DIAGNOSIS — M150 Primary generalized (osteo)arthritis: Secondary | ICD-10-CM | POA: Diagnosis not present

## 2019-01-07 DIAGNOSIS — Z79891 Long term (current) use of opiate analgesic: Secondary | ICD-10-CM | POA: Diagnosis not present

## 2019-01-07 DIAGNOSIS — M25511 Pain in right shoulder: Secondary | ICD-10-CM | POA: Diagnosis not present

## 2019-01-07 DIAGNOSIS — G894 Chronic pain syndrome: Secondary | ICD-10-CM | POA: Diagnosis not present

## 2019-01-07 DIAGNOSIS — M150 Primary generalized (osteo)arthritis: Secondary | ICD-10-CM | POA: Diagnosis not present

## 2019-01-25 DIAGNOSIS — Z23 Encounter for immunization: Secondary | ICD-10-CM | POA: Diagnosis not present

## 2019-01-25 DIAGNOSIS — I2510 Atherosclerotic heart disease of native coronary artery without angina pectoris: Secondary | ICD-10-CM | POA: Diagnosis not present

## 2019-01-25 DIAGNOSIS — I10 Essential (primary) hypertension: Secondary | ICD-10-CM | POA: Diagnosis not present

## 2019-01-25 DIAGNOSIS — D649 Anemia, unspecified: Secondary | ICD-10-CM | POA: Diagnosis not present

## 2019-01-25 DIAGNOSIS — Z79899 Other long term (current) drug therapy: Secondary | ICD-10-CM | POA: Diagnosis not present

## 2019-01-25 DIAGNOSIS — E7439 Other disorders of intestinal carbohydrate absorption: Secondary | ICD-10-CM | POA: Diagnosis not present

## 2019-01-25 DIAGNOSIS — E785 Hyperlipidemia, unspecified: Secondary | ICD-10-CM | POA: Diagnosis not present

## 2019-01-25 DIAGNOSIS — F419 Anxiety disorder, unspecified: Secondary | ICD-10-CM | POA: Diagnosis not present

## 2019-01-25 DIAGNOSIS — M109 Gout, unspecified: Secondary | ICD-10-CM | POA: Diagnosis not present

## 2019-02-05 DIAGNOSIS — M25511 Pain in right shoulder: Secondary | ICD-10-CM | POA: Diagnosis not present

## 2019-02-05 DIAGNOSIS — M150 Primary generalized (osteo)arthritis: Secondary | ICD-10-CM | POA: Diagnosis not present

## 2019-02-05 DIAGNOSIS — G894 Chronic pain syndrome: Secondary | ICD-10-CM | POA: Diagnosis not present

## 2019-02-05 DIAGNOSIS — Z79891 Long term (current) use of opiate analgesic: Secondary | ICD-10-CM | POA: Diagnosis not present

## 2019-03-07 ENCOUNTER — Other Ambulatory Visit: Payer: Self-pay

## 2019-03-11 DIAGNOSIS — G894 Chronic pain syndrome: Secondary | ICD-10-CM | POA: Diagnosis not present

## 2019-03-11 DIAGNOSIS — M25511 Pain in right shoulder: Secondary | ICD-10-CM | POA: Diagnosis not present

## 2019-03-11 DIAGNOSIS — Z79891 Long term (current) use of opiate analgesic: Secondary | ICD-10-CM | POA: Diagnosis not present

## 2019-03-11 DIAGNOSIS — M150 Primary generalized (osteo)arthritis: Secondary | ICD-10-CM | POA: Diagnosis not present

## 2019-03-25 ENCOUNTER — Other Ambulatory Visit: Payer: Self-pay | Admitting: Internal Medicine

## 2019-03-25 ENCOUNTER — Other Ambulatory Visit: Payer: Self-pay | Admitting: Cardiology

## 2019-04-05 ENCOUNTER — Other Ambulatory Visit: Payer: Self-pay | Admitting: Cardiology

## 2019-04-09 DIAGNOSIS — M25511 Pain in right shoulder: Secondary | ICD-10-CM | POA: Diagnosis not present

## 2019-04-09 DIAGNOSIS — G894 Chronic pain syndrome: Secondary | ICD-10-CM | POA: Diagnosis not present

## 2019-04-09 DIAGNOSIS — Z79891 Long term (current) use of opiate analgesic: Secondary | ICD-10-CM | POA: Diagnosis not present

## 2019-04-09 DIAGNOSIS — M150 Primary generalized (osteo)arthritis: Secondary | ICD-10-CM | POA: Diagnosis not present

## 2019-04-24 ENCOUNTER — Other Ambulatory Visit: Payer: Self-pay

## 2019-04-24 MED ORDER — DILTIAZEM HCL ER COATED BEADS 240 MG PO CP24: 240.0000 mg | ORAL_CAPSULE | Freq: Every day | ORAL | 0 refills | Status: DC

## 2019-04-28 ENCOUNTER — Other Ambulatory Visit: Payer: Self-pay | Admitting: Cardiology

## 2019-05-07 DIAGNOSIS — M25511 Pain in right shoulder: Secondary | ICD-10-CM | POA: Diagnosis not present

## 2019-05-07 DIAGNOSIS — M150 Primary generalized (osteo)arthritis: Secondary | ICD-10-CM | POA: Diagnosis not present

## 2019-05-07 DIAGNOSIS — G894 Chronic pain syndrome: Secondary | ICD-10-CM | POA: Diagnosis not present

## 2019-05-07 DIAGNOSIS — Z79891 Long term (current) use of opiate analgesic: Secondary | ICD-10-CM | POA: Diagnosis not present

## 2019-05-23 ENCOUNTER — Other Ambulatory Visit: Payer: Self-pay | Admitting: Cardiology

## 2019-05-23 NOTE — Telephone Encounter (Signed)
Rx has been sent to the pharmacy electronically. ° °

## 2019-05-24 ENCOUNTER — Other Ambulatory Visit: Payer: Self-pay | Admitting: Cardiology

## 2019-05-24 ENCOUNTER — Other Ambulatory Visit: Payer: Self-pay

## 2019-05-24 MED ORDER — METOPROLOL TARTRATE 25 MG PO TABS: ORAL_TABLET | ORAL | 0 refills | Status: DC

## 2019-06-05 DIAGNOSIS — G894 Chronic pain syndrome: Secondary | ICD-10-CM | POA: Diagnosis not present

## 2019-06-05 DIAGNOSIS — Z79891 Long term (current) use of opiate analgesic: Secondary | ICD-10-CM | POA: Diagnosis not present

## 2019-06-05 DIAGNOSIS — M25511 Pain in right shoulder: Secondary | ICD-10-CM | POA: Diagnosis not present

## 2019-06-05 DIAGNOSIS — M150 Primary generalized (osteo)arthritis: Secondary | ICD-10-CM | POA: Diagnosis not present

## 2019-06-17 ENCOUNTER — Other Ambulatory Visit: Payer: Self-pay

## 2019-06-17 ENCOUNTER — Ambulatory Visit: Payer: PPO | Admitting: Cardiology

## 2019-06-17 ENCOUNTER — Encounter: Payer: Self-pay | Admitting: Cardiology

## 2019-06-17 VITALS — BP 130/76 | HR 63 | Temp 97.3°F | Ht 68.0 in | Wt 252.0 lb

## 2019-06-17 DIAGNOSIS — E785 Hyperlipidemia, unspecified: Secondary | ICD-10-CM | POA: Diagnosis not present

## 2019-06-17 DIAGNOSIS — Z9861 Coronary angioplasty status: Secondary | ICD-10-CM

## 2019-06-17 DIAGNOSIS — I5032 Chronic diastolic (congestive) heart failure: Secondary | ICD-10-CM

## 2019-06-17 DIAGNOSIS — R0600 Dyspnea, unspecified: Secondary | ICD-10-CM | POA: Diagnosis not present

## 2019-06-17 DIAGNOSIS — R0609 Other forms of dyspnea: Secondary | ICD-10-CM | POA: Insufficient documentation

## 2019-06-17 DIAGNOSIS — I10 Essential (primary) hypertension: Secondary | ICD-10-CM

## 2019-06-17 DIAGNOSIS — I213 ST elevation (STEMI) myocardial infarction of unspecified site: Secondary | ICD-10-CM

## 2019-06-17 DIAGNOSIS — R600 Localized edema: Secondary | ICD-10-CM | POA: Diagnosis not present

## 2019-06-17 DIAGNOSIS — Z716 Tobacco abuse counseling: Secondary | ICD-10-CM | POA: Diagnosis not present

## 2019-06-17 DIAGNOSIS — Z720 Tobacco use: Secondary | ICD-10-CM

## 2019-06-17 DIAGNOSIS — I2510 Atherosclerotic heart disease of native coronary artery without angina pectoris: Secondary | ICD-10-CM | POA: Diagnosis not present

## 2019-06-17 MED ORDER — FUROSEMIDE 40 MG PO TABS: 40.0000 mg | ORAL_TABLET | ORAL | 6 refills | Status: DC | PRN
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
## 2019-06-17 NOTE — Patient Instructions (Signed)
Medication Instructions:   STOP DIGOXIN  ONLY USE FUROSEMIDE  AS NEEDED  *If you need a refill on your cardiac medications before your next appointment, please call your pharmacy*   Lab Work: NOT NEEDED    Testing/Procedures: WILL BE SCHEDULE AT [place] [address] Your physician has requested that you have an echocardiogram. Echocardiography is a painless test that uses sound waves to create images of your heart. It provides your doctor with information about the size and shape of your heart and how well your heart's chambers and valves are working. This procedure takes approximately one hour. There are no restrictions for this procedure.   Follow-Up: At [facility], you and your health needs are our priority.  As part of our continuing mission to provide you with exceptional heart care, we have created designated Provider Care Teams.  These Care Teams include your primary Cardiologist (physician) and Advanced Practice Providers (APPs -  Physician Assistants and Nurse Practitioners) who all work together to provide you with the care you need, when you need it.    Your next appointment:   12 month(s)  The format for your next appointment:   In Person  Provider:   Glenetta Hew, MD   Other Instructions N/A

## 2019-06-18 ENCOUNTER — Encounter: Payer: Self-pay | Admitting: Cardiology

## 2019-06-18 DIAGNOSIS — I5032 Chronic diastolic (congestive) heart failure: Secondary | ICD-10-CM | POA: Insufficient documentation

## 2019-06-18 NOTE — Assessment & Plan Note (Signed)
Distant history of PCI with no active anginal symptoms.  He does have exertional dyspnea and intermittent unusual chest discomfort episodes, but nothing consistent with angina. Last cath was in May 2018 we had PCI of the RCA in setting of an inferior STEMI.  Has not had a stress test evaluation since then.  Plan:   Continue Brilinta maintenance dose without aspirin.  Continue current dose of diltiazem along with metoprolol for antianginal benefit.  Continue current dose of statin.  No longer using frequent nitroglycerin.  Has not been on Imdur.  (Apparently he stopped)

## 2019-06-18 NOTE — Assessment & Plan Note (Signed)
Doing very well now almost 2+ years out from smoking cessation.  He is very happy about this.  I congratulated his efforts.

## 2019-06-18 NOTE — Assessment & Plan Note (Signed)
Almost 3 years out from pretty sizable inferior posterior infarct with occluded RCA.  He is on maintenance dose Brilinta, beta-blocker and calcium blocker along with statin.. . Has not had a stress test or echocardiogram since his recent heart cath-PCI in 2018.

## 2019-06-18 NOTE — Assessment & Plan Note (Signed)
He is having dyspnea, which is unusual since he is lost this weight.  Is difficult to assess because his symptoms are unusual, and his story line is as not always tracking.  We will check 2D echocardiogram to assess EF. Standing Lasix dose probably not necessary, can use as needed. His EF is back to normal now, we can stop digoxin and discontinue with Lopressor and diltiazem.  If pressures go up, would add ACE inhibitor/ARB, but now in the room.

## 2019-06-18 NOTE — Assessment & Plan Note (Signed)
Blood pressure is stable on diltiazem and metoprolol.  Not having any significant lightheaded dizzy symptoms.  Provided blood pressures do not go any higher, would hold off on ARB or ACE inhibitor.

## 2019-06-18 NOTE — Assessment & Plan Note (Signed)
Not sure how much this is deconditioning, but he clearly is noticing exertional dyspnea more than the had no usually noticed.  Certainly when he does any vigorous exertion he will get quite short of breath, but sometimes he has had to stop in the middle of routine exercises.  Does not really have chest comfort associated with this.  His little chest twinging spells are still there.  Not likely coordinated.  Plan: Check 2D echo and continue current dose of diuretic with additional dosing if necessary for worsening dyspnea.

## 2019-06-18 NOTE — Assessment & Plan Note (Signed)
Switching Lasix to as needed

## 2019-06-18 NOTE — Assessment & Plan Note (Signed)
On rosuvastatin.  Labs have been pretty well controlled unfortunately have complete labs from November, but other lipids look well controlled.

## 2019-06-18 NOTE — Assessment & Plan Note (Signed)
Again discussed diet and exercise.  He would really benefit from dietary counseling.  He does seem to be exercising a little bit more but slacked off recently.  We will check 2D echocardiogram to assess exertional dyspnea.  Low threshold to consider stress test.

## 2019-06-22 ENCOUNTER — Other Ambulatory Visit: Payer: Self-pay | Admitting: Cardiology

## 2019-06-25 ENCOUNTER — Other Ambulatory Visit (INDEPENDENT_AMBULATORY_CARE_PROVIDER_SITE_OTHER): Payer: PPO

## 2019-06-25 DIAGNOSIS — I10 Essential (primary) hypertension: Secondary | ICD-10-CM

## 2019-07-02 ENCOUNTER — Other Ambulatory Visit (HOSPITAL_COMMUNITY): Payer: PPO

## 2019-07-04 DIAGNOSIS — M150 Primary generalized (osteo)arthritis: Secondary | ICD-10-CM | POA: Diagnosis not present

## 2019-07-04 DIAGNOSIS — M25511 Pain in right shoulder: Secondary | ICD-10-CM | POA: Diagnosis not present

## 2019-07-04 DIAGNOSIS — Z79891 Long term (current) use of opiate analgesic: Secondary | ICD-10-CM | POA: Diagnosis not present

## 2019-07-04 DIAGNOSIS — G894 Chronic pain syndrome: Secondary | ICD-10-CM | POA: Diagnosis not present

## 2019-07-06 HISTORY — PX: TRANSTHORACIC ECHOCARDIOGRAM: SHX275

## 2019-07-22 ENCOUNTER — Ambulatory Visit (HOSPITAL_COMMUNITY): Payer: PPO | Attending: Cardiology

## 2019-07-22 ENCOUNTER — Other Ambulatory Visit: Payer: Self-pay

## 2019-07-22 DIAGNOSIS — I2510 Atherosclerotic heart disease of native coronary artery without angina pectoris: Secondary | ICD-10-CM | POA: Diagnosis not present

## 2019-07-22 DIAGNOSIS — I213 ST elevation (STEMI) myocardial infarction of unspecified site: Secondary | ICD-10-CM | POA: Insufficient documentation

## 2019-07-22 DIAGNOSIS — Z9861 Coronary angioplasty status: Secondary | ICD-10-CM | POA: Diagnosis not present

## 2019-07-22 DIAGNOSIS — R0600 Dyspnea, unspecified: Secondary | ICD-10-CM | POA: Insufficient documentation

## 2019-07-22 DIAGNOSIS — R0609 Other forms of dyspnea: Secondary | ICD-10-CM

## 2019-07-23 ENCOUNTER — Telehealth: Payer: Self-pay | Admitting: *Deleted

## 2019-07-23 NOTE — Telephone Encounter (Signed)
-----   Message from [name], MD sent at 07/22/2019  4:01 PM EDT ----- Echocardiogram result shows normal pump function of 55 to 60%.  No wall motion abnormalities noted.  The inferior vena cava severe is not dilated and collapses well.  This would suggest that there is no evidence of right-sided failure.  This would suggest that cause for shortness of breath is probably not heart failure.  Glenetta Hew, MD

## 2019-07-23 NOTE — Telephone Encounter (Signed)
Follow up  Pt is returning call about his echo result

## 2019-07-23 NOTE — Telephone Encounter (Signed)
Left  Detailed message of echo result on secure voicemail . Any question may call back

## 2019-07-23 NOTE — Telephone Encounter (Signed)
Pt updated and verbalized understanding.  

## 2019-08-01 DIAGNOSIS — G894 Chronic pain syndrome: Secondary | ICD-10-CM | POA: Diagnosis not present

## 2019-08-01 DIAGNOSIS — M25511 Pain in right shoulder: Secondary | ICD-10-CM | POA: Diagnosis not present

## 2019-08-01 DIAGNOSIS — M150 Primary generalized (osteo)arthritis: Secondary | ICD-10-CM | POA: Diagnosis not present

## 2019-08-01 DIAGNOSIS — Z79891 Long term (current) use of opiate analgesic: Secondary | ICD-10-CM | POA: Diagnosis not present

## 2019-09-04 ENCOUNTER — Other Ambulatory Visit: Payer: Self-pay | Admitting: Cardiology

## 2019-10-03 DIAGNOSIS — M25511 Pain in right shoulder: Secondary | ICD-10-CM | POA: Diagnosis not present

## 2019-10-03 DIAGNOSIS — Z79891 Long term (current) use of opiate analgesic: Secondary | ICD-10-CM | POA: Diagnosis not present

## 2019-10-03 DIAGNOSIS — G894 Chronic pain syndrome: Secondary | ICD-10-CM | POA: Diagnosis not present

## 2019-10-03 DIAGNOSIS — M150 Primary generalized (osteo)arthritis: Secondary | ICD-10-CM | POA: Diagnosis not present

## 2019-11-19 ENCOUNTER — Other Ambulatory Visit: Payer: Self-pay | Admitting: Cardiology

## 2019-11-28 DIAGNOSIS — M47812 Spondylosis without myelopathy or radiculopathy, cervical region: Secondary | ICD-10-CM | POA: Diagnosis not present

## 2019-11-28 DIAGNOSIS — M150 Primary generalized (osteo)arthritis: Secondary | ICD-10-CM | POA: Diagnosis not present

## 2019-11-28 DIAGNOSIS — G894 Chronic pain syndrome: Secondary | ICD-10-CM | POA: Diagnosis not present

## 2019-11-28 DIAGNOSIS — Z79891 Long term (current) use of opiate analgesic: Secondary | ICD-10-CM | POA: Diagnosis not present

## 2020-01-23 DIAGNOSIS — M47812 Spondylosis without myelopathy or radiculopathy, cervical region: Secondary | ICD-10-CM | POA: Diagnosis not present

## 2020-01-23 DIAGNOSIS — Z79891 Long term (current) use of opiate analgesic: Secondary | ICD-10-CM | POA: Diagnosis not present

## 2020-01-23 DIAGNOSIS — M150 Primary generalized (osteo)arthritis: Secondary | ICD-10-CM | POA: Diagnosis not present

## 2020-01-23 DIAGNOSIS — G894 Chronic pain syndrome: Secondary | ICD-10-CM | POA: Diagnosis not present

## 2020-04-06 DIAGNOSIS — G894 Chronic pain syndrome: Secondary | ICD-10-CM | POA: Diagnosis not present

## 2020-04-06 DIAGNOSIS — M47812 Spondylosis without myelopathy or radiculopathy, cervical region: Secondary | ICD-10-CM | POA: Diagnosis not present

## 2020-04-06 DIAGNOSIS — M150 Primary generalized (osteo)arthritis: Secondary | ICD-10-CM | POA: Diagnosis not present

## 2020-04-06 DIAGNOSIS — Z79891 Long term (current) use of opiate analgesic: Secondary | ICD-10-CM | POA: Diagnosis not present

## 2020-05-15 ENCOUNTER — Other Ambulatory Visit: Payer: Self-pay | Admitting: Cardiology

## 2020-06-01 DIAGNOSIS — M150 Primary generalized (osteo)arthritis: Secondary | ICD-10-CM | POA: Diagnosis not present

## 2020-06-01 DIAGNOSIS — Z79891 Long term (current) use of opiate analgesic: Secondary | ICD-10-CM | POA: Diagnosis not present

## 2020-06-01 DIAGNOSIS — G894 Chronic pain syndrome: Secondary | ICD-10-CM | POA: Diagnosis not present

## 2020-06-01 DIAGNOSIS — M47812 Spondylosis without myelopathy or radiculopathy, cervical region: Secondary | ICD-10-CM | POA: Diagnosis not present

## 2020-07-21 DIAGNOSIS — G894 Chronic pain syndrome: Secondary | ICD-10-CM | POA: Diagnosis not present

## 2020-07-21 DIAGNOSIS — Z79891 Long term (current) use of opiate analgesic: Secondary | ICD-10-CM | POA: Diagnosis not present

## 2020-07-21 DIAGNOSIS — M47812 Spondylosis without myelopathy or radiculopathy, cervical region: Secondary | ICD-10-CM | POA: Diagnosis not present

## 2020-07-21 DIAGNOSIS — M150 Primary generalized (osteo)arthritis: Secondary | ICD-10-CM | POA: Diagnosis not present

## 2020-08-12 DIAGNOSIS — I2510 Atherosclerotic heart disease of native coronary artery without angina pectoris: Secondary | ICD-10-CM | POA: Diagnosis not present

## 2020-08-12 DIAGNOSIS — R739 Hyperglycemia, unspecified: Secondary | ICD-10-CM | POA: Diagnosis not present

## 2020-08-12 DIAGNOSIS — I10 Essential (primary) hypertension: Secondary | ICD-10-CM | POA: Diagnosis not present

## 2020-08-12 DIAGNOSIS — Z125 Encounter for screening for malignant neoplasm of prostate: Secondary | ICD-10-CM | POA: Diagnosis not present

## 2020-08-12 DIAGNOSIS — E789 Disorder of lipoprotein metabolism, unspecified: Secondary | ICD-10-CM | POA: Diagnosis not present

## 2020-08-12 DIAGNOSIS — D649 Anemia, unspecified: Secondary | ICD-10-CM | POA: Diagnosis not present

## 2020-08-12 DIAGNOSIS — Z0000 Encounter for general adult medical examination without abnormal findings: Secondary | ICD-10-CM | POA: Diagnosis not present

## 2020-08-13 ENCOUNTER — Other Ambulatory Visit: Payer: Self-pay | Admitting: Cardiology

## 2020-08-19 DIAGNOSIS — R059 Cough, unspecified: Secondary | ICD-10-CM | POA: Diagnosis not present

## 2020-08-19 DIAGNOSIS — Z79899 Other long term (current) drug therapy: Secondary | ICD-10-CM | POA: Diagnosis not present

## 2020-08-19 DIAGNOSIS — Z1211 Encounter for screening for malignant neoplasm of colon: Secondary | ICD-10-CM | POA: Diagnosis not present

## 2020-08-19 DIAGNOSIS — F419 Anxiety disorder, unspecified: Secondary | ICD-10-CM | POA: Diagnosis not present

## 2020-08-19 DIAGNOSIS — R0989 Other specified symptoms and signs involving the circulatory and respiratory systems: Secondary | ICD-10-CM | POA: Diagnosis not present

## 2020-08-19 DIAGNOSIS — R042 Hemoptysis: Secondary | ICD-10-CM | POA: Diagnosis not present

## 2020-08-19 DIAGNOSIS — Z0000 Encounter for general adult medical examination without abnormal findings: Secondary | ICD-10-CM | POA: Diagnosis not present

## 2020-08-19 DIAGNOSIS — I10 Essential (primary) hypertension: Secondary | ICD-10-CM | POA: Diagnosis not present

## 2020-08-19 DIAGNOSIS — M797 Fibromyalgia: Secondary | ICD-10-CM | POA: Diagnosis not present

## 2020-08-19 DIAGNOSIS — J449 Chronic obstructive pulmonary disease, unspecified: Secondary | ICD-10-CM | POA: Diagnosis not present

## 2020-08-19 DIAGNOSIS — I2510 Atherosclerotic heart disease of native coronary artery without angina pectoris: Secondary | ICD-10-CM | POA: Diagnosis not present

## 2020-08-19 DIAGNOSIS — M109 Gout, unspecified: Secondary | ICD-10-CM | POA: Diagnosis not present

## 2020-08-26 ENCOUNTER — Other Ambulatory Visit: Payer: Self-pay | Admitting: Internal Medicine

## 2020-08-26 DIAGNOSIS — R042 Hemoptysis: Secondary | ICD-10-CM

## 2020-08-31 ENCOUNTER — Other Ambulatory Visit: Payer: Self-pay

## 2020-08-31 ENCOUNTER — Encounter: Payer: Self-pay | Admitting: Cardiology

## 2020-08-31 ENCOUNTER — Ambulatory Visit: Payer: PPO | Admitting: Cardiology

## 2020-08-31 VITALS — BP 112/58 | HR 54 | Ht 68.0 in | Wt 223.0 lb

## 2020-08-31 DIAGNOSIS — I5030 Unspecified diastolic (congestive) heart failure: Secondary | ICD-10-CM | POA: Diagnosis not present

## 2020-08-31 DIAGNOSIS — R0601 Orthopnea: Secondary | ICD-10-CM | POA: Diagnosis not present

## 2020-08-31 DIAGNOSIS — I213 ST elevation (STEMI) myocardial infarction of unspecified site: Secondary | ICD-10-CM

## 2020-08-31 DIAGNOSIS — R079 Chest pain, unspecified: Secondary | ICD-10-CM

## 2020-08-31 DIAGNOSIS — I2510 Atherosclerotic heart disease of native coronary artery without angina pectoris: Secondary | ICD-10-CM

## 2020-08-31 DIAGNOSIS — I10 Essential (primary) hypertension: Secondary | ICD-10-CM | POA: Diagnosis not present

## 2020-08-31 DIAGNOSIS — Z9861 Coronary angioplasty status: Secondary | ICD-10-CM

## 2020-08-31 DIAGNOSIS — E785 Hyperlipidemia, unspecified: Secondary | ICD-10-CM | POA: Diagnosis not present

## 2020-08-31 DIAGNOSIS — I5032 Chronic diastolic (congestive) heart failure: Secondary | ICD-10-CM | POA: Diagnosis not present

## 2020-08-31 DIAGNOSIS — R042 Hemoptysis: Secondary | ICD-10-CM

## 2020-08-31 MED ORDER — FUROSEMIDE 20 MG PO TABS: 20.0000 mg | ORAL_TABLET | Freq: Every day | ORAL | 11 refills | Status: DC | PRN
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
## 2020-08-31 NOTE — Patient Instructions (Signed)
Medication Instructions:   Stop taking aspirin and Brilinta for 7days , then restart Aspirin  81 mg on the third day stay off BRILINTA  then restart   Brilinta on the 8th day and stop taking aspirin  81 mg completely   Lasix ( furosemide ) 20 mg for 7 days  then you can take medication as needed daily for shortness of breath while laying down , or cough , or swelling or general shortness of breath    *If you need a refill on your cardiac medications before your next appointment, please call your pharmacy*   Lab Work: BNP- today   If you have labs (blood work) drawn today and your tests are completely normal, you will receive your results only by: MyChart Message (if you have MyChart) OR A paper copy in the mail If you have any lab test that is abnormal or we need to change your treatment, we will call you to review the results.   Testing/Procedures: Will be schedule at [place] has requested that you have an echocardiogram. Echocardiography is a painless test that uses sound waves to create images of your heart. It provides your doctor with information about the size and shape of your heart and how well your heart's chambers and valves are working. This procedure takes approximately one hour. There are no restrictions for this procedure.    Follow-Up: At [facility] you and your health needs are our priority.  As part of our continuing mission to provide you with exceptional heart care, we have created designated Provider Care Teams.  These Care Teams include your primary Cardiologist (physician) and Advanced Practice Providers (APPs -  Physician Assistants and Nurse Practitioners) who all work together to provide you with the care you need, when you need it.  We recommend signing up for the patient portal called "MyChart".  Sign up information is provided on this After Visit Summary.  MyChart is used to connect with patients for Virtual  Visits (Telemedicine).  Patients are able to view lab/test results, encounter notes, upcoming appointments, etc.  Non-urgent messages can be sent to your provider as well.   To learn more about what you can do with MyChart, go to [email or web address].    Your next appointment:   3 month(s)  The format for your next appointment:   In Person  Provider:   You may see Glenetta Hew, MD or one of the following Advanced Practice Providers on your designated Care Team:   Odie Sera NP Jory Sims, DNP, ANP Sande Rives PA [employer] [place]

## 2020-09-01 LAB — BRAIN NATRIURETIC PEPTIDE: BNP: 39.6 pg/mL (ref 0.0–100.0)

## 2020-09-03 ENCOUNTER — Encounter: Payer: Self-pay | Admitting: Cardiology

## 2020-09-03 ENCOUNTER — Telehealth: Payer: Self-pay

## 2020-09-03 NOTE — Assessment & Plan Note (Addendum)
Stable blood pressure on diltiazem and metoprolol.  Not on ACE inhibitor or ARB because of previous history of orthostasis, however if EF is down or if diastolic function seems to be worsening, may switch from diltiazem to ARB.[name]

## 2020-09-03 NOTE — Assessment & Plan Note (Addendum)
Unfortunately, he did not have any of this when I saw him.  He said that earlier just before the visit he had had an episode where he coughed up a little "wad of blood".  Not described as blood-tinged sputum.  This may be related to what was presumably bronchitis treated with Z-Pak by the PCP.  Interestingly, he still having some hemoptysis now.   Chest CT was presumably ordered by PCP.[name]    We can also check a 2D echo and BNP to assess for CHF although it is not the type of hemoptysis he would see with CHF.  Since he is having dyspnea I am prescribing Lasix again.   I have asked him hold Brilinta for 7 days.  After the third day he will start aspirin 81 mg which will be discontinued once he restarts Brilinta.

## 2020-09-03 NOTE — Assessment & Plan Note (Signed)
He is on stable dose of rosuvastatin with excellent lipid control based on most recent labs.

## 2020-09-03 NOTE — Assessment & Plan Note (Signed)
He is now no longer "morbidly obese ".  BMI is down to 34 basically with significant weight loss.  I congratulated him on the weight loss.

## 2020-09-03 NOTE — Assessment & Plan Note (Signed)
Focal pinpoint chest discomfort which does not sound at all like his anginal symptoms.  It is constant.  He is due to have a chest CT ordered which may help [place] out.

## 2020-09-03 NOTE — Assessment & Plan Note (Signed)
Again, difficult to tell if he truly is having orthopnea or not.  He sleeps in a chair which he says he has been doing for a long time, but this the first that hurt it.  Assess for progression of  heart failure with BNP 2D echo.

## 2020-09-03 NOTE — Assessment & Plan Note (Signed)
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 Repeat echo last year showed preserved EF with "normal diastolic parameters and no evidence of CHF despite having edema.  He is not really having any anginal pain but now seems to be having potential CHF symptoms again.  I am concerned about the hemoptysis and dyspnea.  Plan: Check BNP and recheck 2D echo.  Represcribed furosemide.-Take 20 mg daily for 7 days and then use as needed.  With hemoptysis, I am stopping Brilinta for 7 days-switch for aspirin after third day and then restart Brilinta after 7 days stopping aspirin.

## 2020-09-03 NOTE — Assessment & Plan Note (Signed)
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  Distant PCI to the LAD as well as the RCA.  Not really having any active angina symptoms.  Chest pain is described does not sound like angina.  He truly had angina and not necessarily just dyspnea at the time of his MI.[name] [name]  Has been on maintenance dose Brilinta due to extensive stents.  Plan:  Continue current dose of statin (excellent control), beta-blocker and diltiazem (diltiazem is being used initially for rate control but also for antianginal benefit.  No longer requiring Imdur.  Was not helping.  Temporarily hold maintenance dose of Brilinta for 7 days with complaint of hemoptysis.  Will cover for the last 4 days with 81 mg aspirin.  Going forward, okay to hold Brilinta similarly for bleeding or hemoptysis.  Okay to hold Brilinta 5 to 7 days preop for surgery or procedures.  (7 days for high risk procedures such as neurologic or spinal procedures as well as organ biopsies)

## 2020-09-03 NOTE — Assessment & Plan Note (Signed)
We evaluated him last year with an echo and it said normal diastolic parameters which seems unlikely.  He now is having dyspnea and some hemoptysis.  Somewhat concerning.  Plan: Check BNP and 2D echo.  Pending results of echo, may switch from diltiazem to ARB  Restart Lasix (not sure what happened to his previous prescription) 20 mg daily for 7 days and then use as needed.

## 2020-09-04 ENCOUNTER — Telehealth: Payer: Self-pay | Admitting: *Deleted

## 2020-09-04 DIAGNOSIS — R911 Solitary pulmonary nodule: Secondary | ICD-10-CM

## 2020-09-04 HISTORY — DX: Solitary pulmonary nodule: R91.1

## 2020-09-04 NOTE — Telephone Encounter (Signed)
The patient has been notified of the result and verbalized understanding.  All questions (if any) were answered. Raiford Simmonds, RN 09/04/2020 11:55 AM

## 2020-09-04 NOTE — Telephone Encounter (Signed)
-----   Message from [name], MD sent at 09/03/2020  2:45 AM EDT ----- Very reassuring.  BNP level is in the normal range.  This would argue against CHF.  I am expecting the echocardiogram to be reassuring as well.  After the 7 days of Lasix/furosemide, use the 20 mg dose and as-needed basis for worsening shortness of breath.  Glenetta Hew, MD

## 2020-09-09 MED ORDER — DIPHENHYDRAMINE HCL 50 MG PO TABS: 50.0000 mg | ORAL_TABLET | Freq: Once | ORAL | 0 refills | Status: DC

## 2020-09-09 MED ORDER — PREDNISONE 50 MG PO TABS: ORAL_TABLET | ORAL | 0 refills | Status: DC

## 2020-09-09 NOTE — Progress Notes (Signed)
Phone call to patient to review instructions for 13 hr prep for CT w/ contrast on 09/22/20 at 1:00 PM. Prescription called into [place]. Pt aware and verbalized understanding of instructions.  Prescription: Pt to take 50 mg of prednisone on 09/22/20 at 12: 00 AM (midnight), 50 mg of prednisone on 09/22/20 at 6:00 am, and 50 mg of prednisone on 09/22/20 at 12:00 PM (noon). Pt is also to take 50 mg of benadryl on 09/22/20 at 12:00 PM (noon). Please call [phone] with any questions.  Benadryl was also sent in as a prescription, per the patients request.   Pt was advised to have a driver the day of taking benadryl as this may cause drowsiness. Pt verbalized understanding.

## 2020-09-10 ENCOUNTER — Encounter: Payer: Self-pay | Admitting: Pulmonary Disease

## 2020-09-10 ENCOUNTER — Telehealth: Payer: Self-pay | Admitting: Pulmonary Disease

## 2020-09-10 ENCOUNTER — Other Ambulatory Visit: Payer: Self-pay

## 2020-09-10 ENCOUNTER — Ambulatory Visit: Payer: PPO | Admitting: Pulmonary Disease

## 2020-09-10 VITALS — BP 116/60 | HR 51 | Ht 68.0 in | Wt 224.0 lb

## 2020-09-10 DIAGNOSIS — I5030 Unspecified diastolic (congestive) heart failure: Secondary | ICD-10-CM | POA: Diagnosis not present

## 2020-09-10 DIAGNOSIS — I2510 Atherosclerotic heart disease of native coronary artery without angina pectoris: Secondary | ICD-10-CM | POA: Diagnosis not present

## 2020-09-10 DIAGNOSIS — Z7902 Long term (current) use of antithrombotics/antiplatelets: Secondary | ICD-10-CM

## 2020-09-10 DIAGNOSIS — Z9861 Coronary angioplasty status: Secondary | ICD-10-CM

## 2020-09-10 DIAGNOSIS — Z87891 Personal history of nicotine dependence: Secondary | ICD-10-CM

## 2020-09-10 DIAGNOSIS — R042 Hemoptysis: Secondary | ICD-10-CM

## 2020-09-10 DIAGNOSIS — R918 Other nonspecific abnormal finding of lung field: Secondary | ICD-10-CM
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
## 2020-09-10 NOTE — Telephone Encounter (Signed)
Pt informed of the following:   CT sched 7/8 at 1:00pm; 12:40pm arrival time.   Covid test: 7/12 at 12:00pm   Bronch: 7/15 at 8:30am; 6:00am arrival time. Case # [id number]  Super D Disk being sent from [place] to [facility]. [facility] will send to [facility].

## 2020-09-10 NOTE — Patient Instructions (Signed)
Thank you for visiting Dr. Valeta Harms at [facility]. Today we recommend the following:  Orders Placed This Encounter  Procedures   Procedural/ Surgical Case Request: VIDEO BRONCHOSCOPY WITH ENDOBRONCHIAL NAVIGATION, VIDEO BRONCHOSCOPY WITH ENDOBRONCHIAL ULTRASOUND   CT Super D Chest Wo Contrast   Ambulatory referral to Pulmonology    Return in about 4 weeks (around 10/08/2020) for w/ Dr. Valeta Harms .    Please do your part to reduce the spread of COVID-19.
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
## 2020-09-11 ENCOUNTER — Ambulatory Visit
Admission: RE | Admit: 2020-09-11 | Discharge: 2020-09-11 | Disposition: A | Discharge status: Home / Self Care | Payer: PPO | Source: Ambulatory Visit | Attending: Pulmonary Disease | Admitting: Pulmonary Disease

## 2020-09-11 DIAGNOSIS — J439 Emphysema, unspecified: Secondary | ICD-10-CM | POA: Diagnosis not present

## 2020-09-11 DIAGNOSIS — R911 Solitary pulmonary nodule: Secondary | ICD-10-CM | POA: Diagnosis not present

## 2020-09-11 DIAGNOSIS — I700 Atherosclerosis of aorta: Secondary | ICD-10-CM | POA: Diagnosis not present

## 2020-09-11 DIAGNOSIS — R918 Other nonspecific abnormal finding of lung field: Secondary | ICD-10-CM

## 2020-09-11 IMAGING — CT CT CHEST SUPER D W/O CM
2 of 5 series · 15 of 36 positions shown, 18 images · non-contrast
Comparison: Chest radiograph [DATE] and CT chest [DATE].

CLINICAL DATA: Right lower lobe mass.

EXAM:
CT CHEST WITHOUT CONTRAST
TECHNIQUE: Multidetector CT imaging of the chest was performed using thin slice
collimation for electromagnetic bronchoscopy planning purposes,
without intravenous contrast.

[Series 4: chest 2.00 br40 s3 · coronal · 0.67mm/px · 3 of 176 slices shown]
[im 36/176  lung]
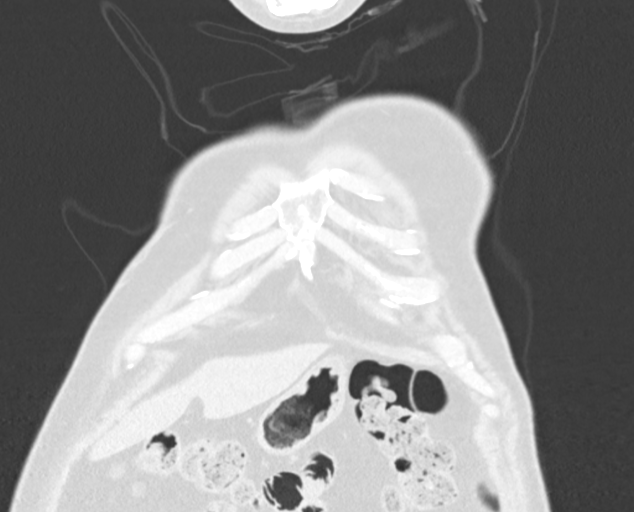
[im 71/176  lung]
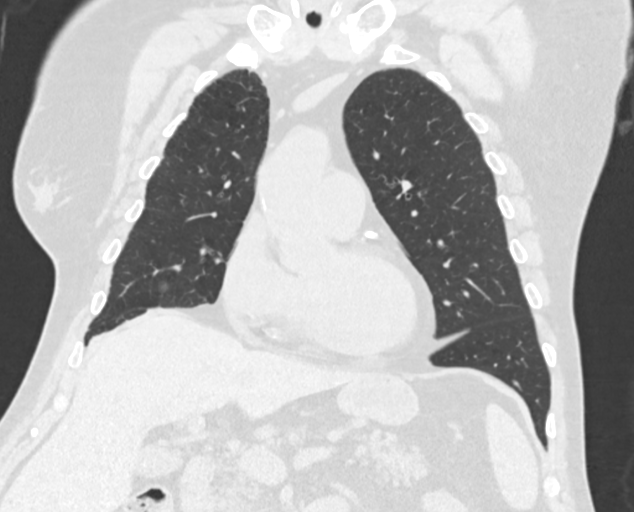
[im 106/176  lung]
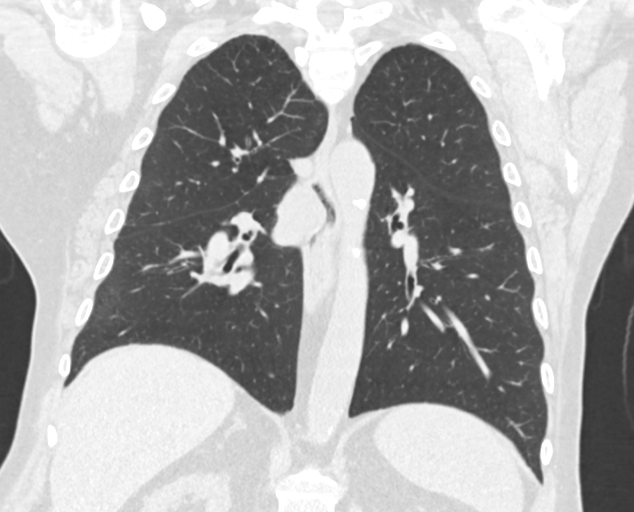

[Series 10: chest 1.00 br40 s3 super d · axial · 0.82mm/px · z∈[+1451,+1745]mm · 12 of 425 slices shown, 15 images]
[im 29/425  mediastinal]
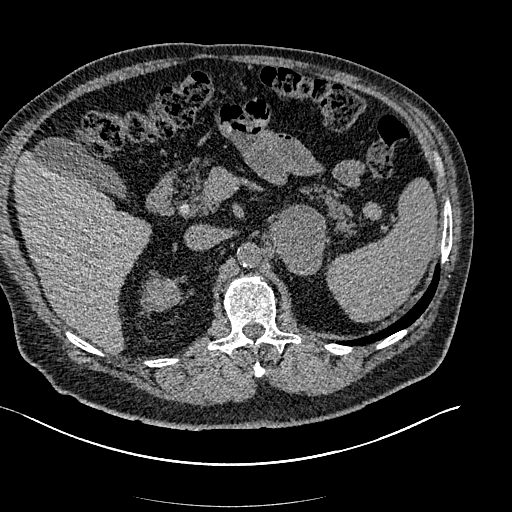
[im 29/425  lung]
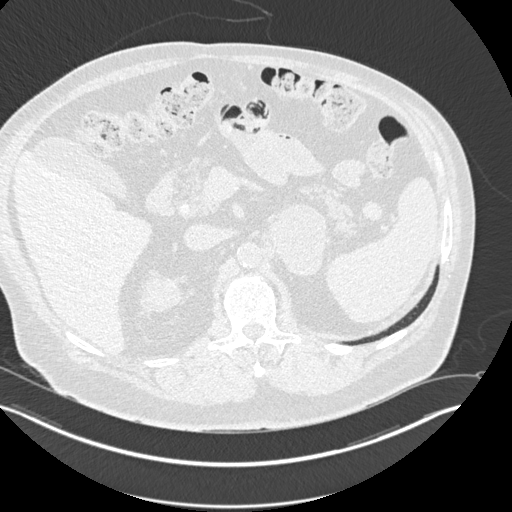
[im 57/425  lung]
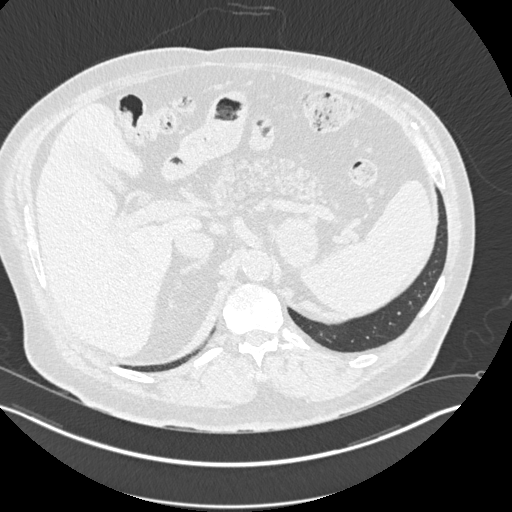
[im 85/425  lung]
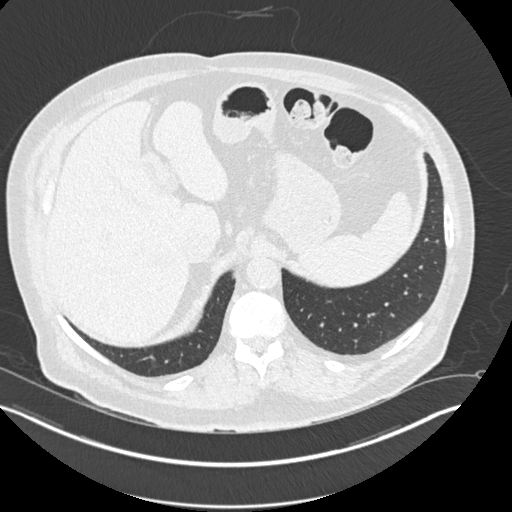
[im 142/425  lung]
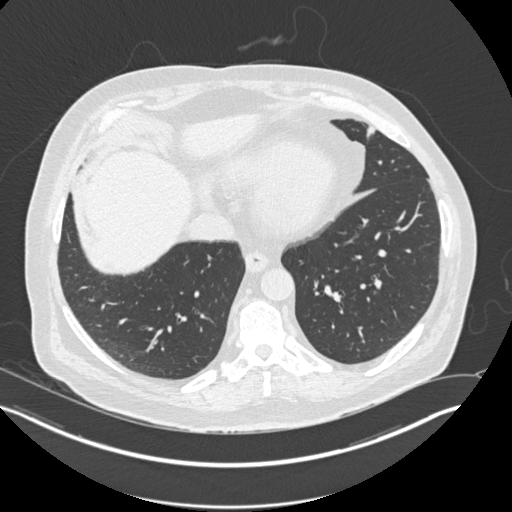
[im 170/425  mediastinal]
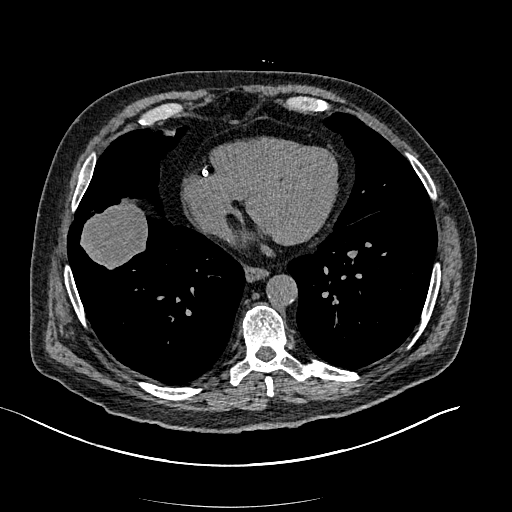
[im 170/425  lung]
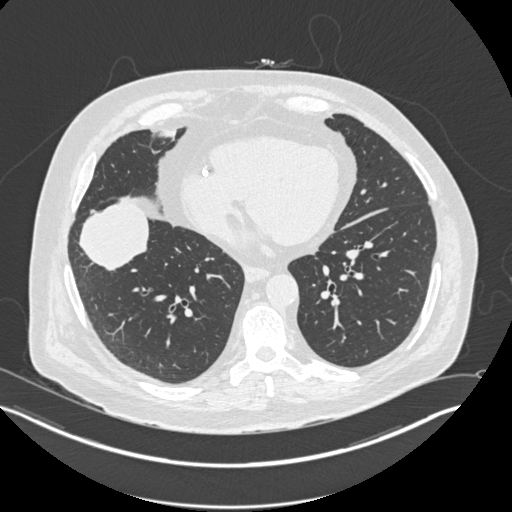
[im 198/425  lung]
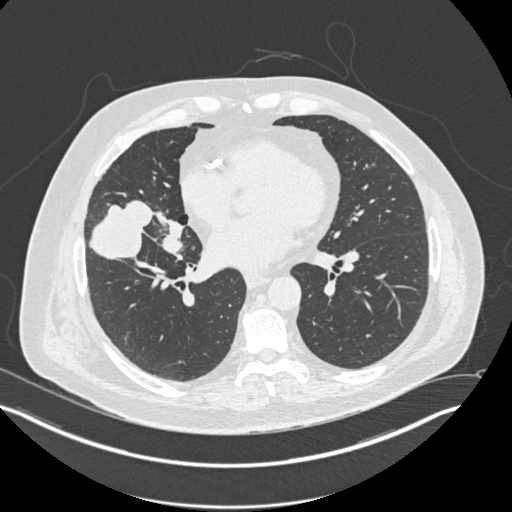
[im 227/425  lung]
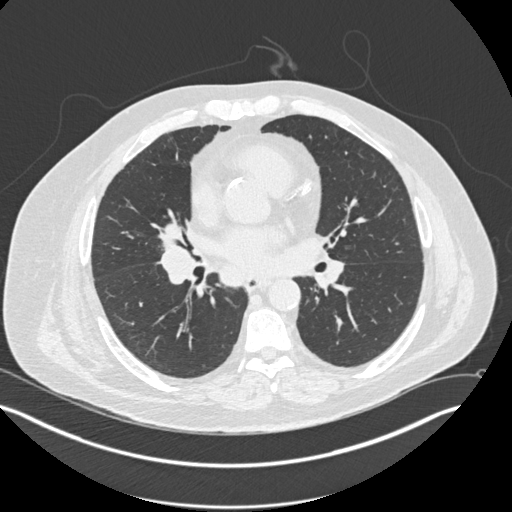
[im 255/425  lung]
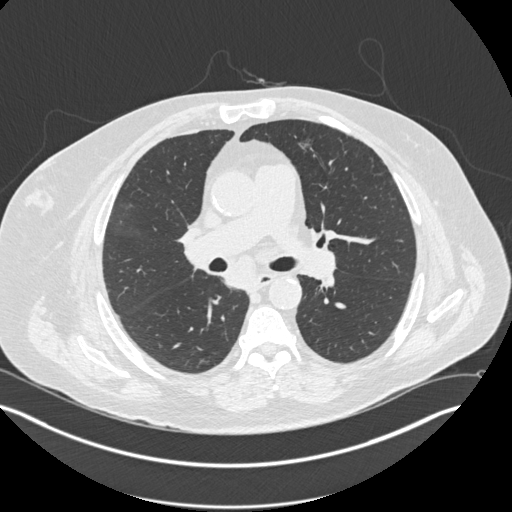
[im 283/425  mediastinal]
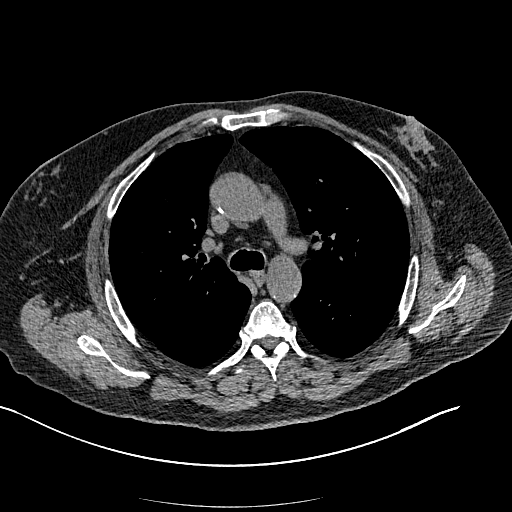
[im 283/425  lung]
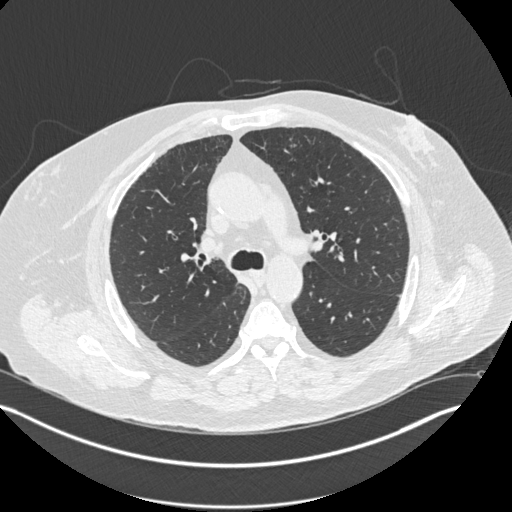
[im 340/425  lung]
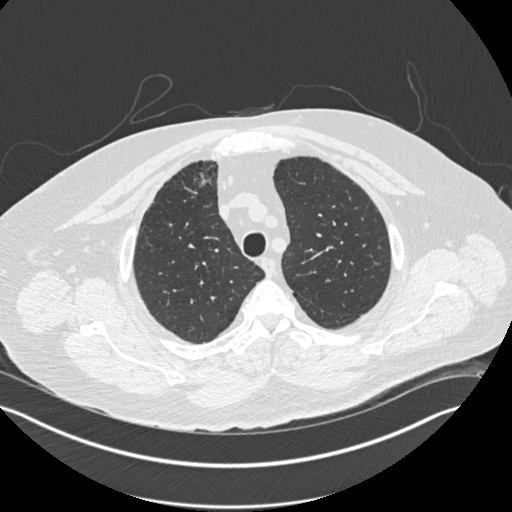
[im 368/425  lung]
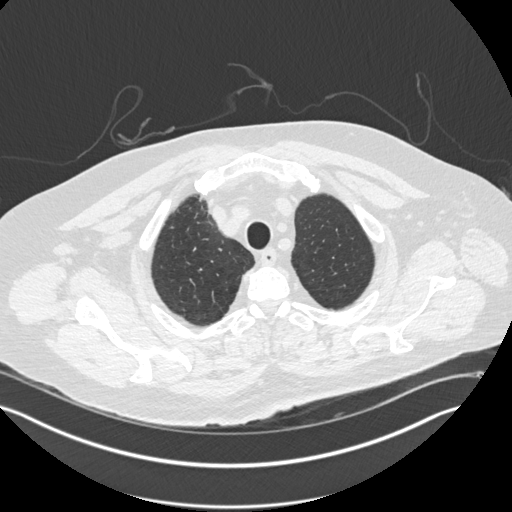
[im 396/425  lung]
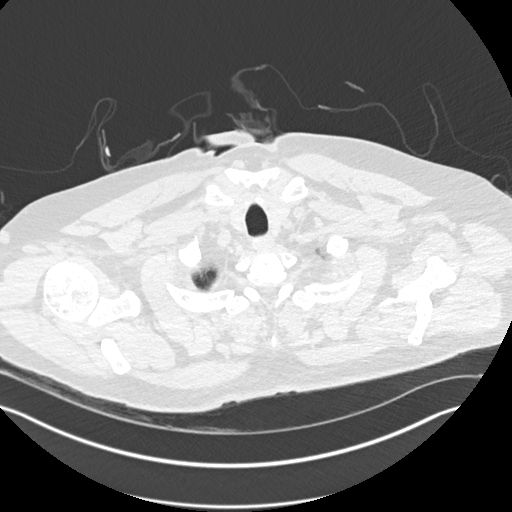

[15 of 36 positions shown; findings below may reference images not displayed]

FINDINGS: Cardiovascular: Atherosclerotic calcification of the aorta, aortic
valve and coronary arteries. Pulmonic trunk is enlarged. Heart size
normal. No pericardial effusion.

Mediastinum/Nodes: Enlarged subcarinal lymph node measures 2.4 cm.
Right hilar adenopathy measures up to approximately 2.7 cm.
Definitive assessment is limited without IV contrast. No additional
mediastinal, left hilar or axillary lymph nodes. Esophagus is
grossly unremarkable.

Lungs/Pleura: Centrilobular and paraseptal emphysema. A mass in the
anterior right lower lobe crosses the right major fissure, measuring
4.8 x 5.7 x 4.6 cm. Subpleural nodules along the right major fissure
and in the anterior right middle lobe appear slightly larger than on
[DATE]. Left lower lobe nodules measure up to 6 mm (8/94),
unchanged and benign. No pleural fluid. Airway is unremarkable.

Upper Abdomen: Visualized portion of the liver is unremarkable.
Stones in the gallbladder. Right adrenal gland is unremarkable. Left
adrenal mass measures 4.6 x 5.8 cm, new from [DATE].
Low-attenuation lesion in the interpolar right kidney measures
cm, incompletely visualized. Visualized portions of the kidneys and
spleen are otherwise unremarkable. High attenuation material in the
region of the lower common bile duct measures up to 7 mm. Visualized
portions of the pancreas, stomach and bowel are otherwise
unremarkable.

Musculoskeletal: Degenerative changes in the spine. No worrisome
lytic or sclerotic lesions. Old bilateral rib fractures.
IMPRESSION: 1. Stage IV primary bronchogenic carcinoma as evidenced by a large
right lower lobe mass, enlarged adjacent subpleural lymph nodes,
right hilar and subcarinal adenopathy and a left adrenal metastasis.
2. Cholelithiasis and choledocholithiasis without biliary ductal
dilatation.
3. Aortic atherosclerosis ([OT]-[OT]). Coronary artery
calcification.
4. Enlarged pulmonic trunk, indicative of pulmonary arterial
hypertension.
5. Emphysema ([OT]-[OT]).

## 2020-09-15 ENCOUNTER — Other Ambulatory Visit (HOSPITAL_COMMUNITY)
Admission: RE | Admit: 2020-09-15 | Discharge: 2020-09-15 | Disposition: A | Discharge status: Home / Self Care | Payer: PPO | Source: Ambulatory Visit | Attending: Pulmonary Disease | Admitting: Pulmonary Disease

## 2020-09-15 ENCOUNTER — Other Ambulatory Visit (HOSPITAL_COMMUNITY): Payer: PPO

## 2020-09-15 DIAGNOSIS — Z01812 Encounter for preprocedural laboratory examination: Secondary | ICD-10-CM | POA: Insufficient documentation

## 2020-09-15 DIAGNOSIS — Z20822 Contact with and (suspected) exposure to covid-19: Secondary | ICD-10-CM | POA: Diagnosis not present

## 2020-09-15 LAB — SARS CORONAVIRUS 2 (TAT 6-24 HRS): SARS Coronavirus 2: NEGATIVE

## 2020-09-16 ENCOUNTER — Encounter (HOSPITAL_COMMUNITY): Payer: Self-pay | Admitting: Pulmonary Disease

## 2020-09-16 NOTE — Progress Notes (Signed)
DUE TO COVID-19 ONLY ONE VISITOR IS ALLOWED TO COME WITH YOU AND STAY IN THE WAITING ROOM ONLY DURING PRE OP AND PROCEDURE DAY OF SURGERY.   PCP - Dr Alvia Grove Cardiologist - Dr Glenetta Hew Pain Mgmt - Dr Elta Guadeloupe L. Phillips  CT Chest x-ray - 09/11/20 EKG - 08/31/20 Stress Test - 10/04/12 ECHO - 07/22/19 Cardiac Cath - 07/23/16  Sleep Study -  n/a CPAP - none  Blood Thinner Instructions:  Last dose of Brilinta was on 09/10/20  Anesthesia review: Yes  STOP now taking any Aspirin (unless otherwise instructed by your surgeon), Aleve, Naproxen, Ibuprofen, Motrin, Advil, Goody's, BC's, all herbal medications, fish oil, and all vitamins.   Coronavirus Screening Covid test on 09/15/20 per MD was negative. Do you have any of the following symptoms:  Cough yes/no: Yes Fever (>100.72F)  yes/no: No Runny nose yes/no: No Sore throat yes/no: No Difficulty breathing/shortness of breath  yes/no: Yes  Have you traveled in the last 14 days and where? yes/no: No  Patient verbalized understanding of instructions that were given via phone.

## 2020-09-17 NOTE — Anesthesia Preprocedure Evaluation (Addendum)
Anesthesia Evaluation  Patient identified by MRN, date of birth, ID band Patient awake    Reviewed: Allergy & Precautions, NPO status , Patient's Chart, lab work & pertinent test results, reviewed documented beta blocker date and time   Airway Mallampati: III  TM Distance: >3 FB Neck ROM: Full    Dental no notable dental hx. (+) Teeth Intact, Dental Advisory Given   Pulmonary shortness of breath, COPD, former smoker,    Pulmonary exam normal breath sounds clear to auscultation       Cardiovascular hypertension, Pt. on home beta blockers and Pt. on medications + CAD, + Past MI, + Cardiac Stents, +CHF and + DOE  Normal cardiovascular exam Rhythm:Regular Rate:Normal  CT super D chest 09/11/2020: IMPRESSION: 1. Stage IV primary bronchogenic carcinoma as evidenced by a large right lower lobe mass, enlarged adjacent subpleural lymph nodes, right hilar and subcarinal adenopathy and a left adrenal metastasis. 2. Cholelithiasis and choledocholithiasis without biliary ductal dilatation. 3. Aortic atherosclerosis (ICD10-I70.0). Coronary artery calcification. 4. Enlarged pulmonic trunk, indicative of pulmonary arterial hypertension. 5. Emphysema (ICD10-J43.9).  TTE 07/22/2019: 1. Normal LV function; mildly dilated aortic root; no significant  valvular disease.  2. Left ventricular ejection fraction, by estimation, is 55 to 60%. The  left ventricle has normal function. The left ventricle has no regional  wall motion abnormalities. Left ventricular diastolic parameters were  normal.  3. Right ventricular systolic function is normal. The right ventricular  size is normal. Tricuspid regurgitation signal is inadequate for assessing  PA pressure.  4. The mitral valve is normal in structure. Trivial mitral valve  regurgitation. No evidence of mitral stenosis.  5. The aortic valve is normal in structure. Aortic valve regurgitation is  not  visualized. No aortic stenosis is present.  6. Aortic dilatation noted. There is mild dilatation of the aortic root  measuring 42 mm.  7. The inferior vena cava is normal in size with greater than 50%  respiratory variability, suggesting right atrial pressure of 3 mmHg.   PCI 07/23/2016: 1. 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 Widely patent LAD stent with minimal nonobstructive disease 3. Patent left main and left circumflex with no obstruction 4. Mild segmental contraction abnormality of the left ventricle with akinesis of the basal inferior wall and hypokinesis of the mid inferior wall, consistent with this patient's presentation of acute inferior MI. The LVEF is preserved at 55%. 5. Successful PCI of the right coronary artery using aspiration thrombectomy and drug-eluting stent implantation (3.5 x 38 mm Promus DES)  Recommendations: Patient will be monitored in the ICU. He is hemodynamically stable. He is loaded with brilinta 180 mg during the procedure on the Cath Lab table. Aggrastat will be continued for 12 hours unless there are bleeding complications. The patient would be eligible for discharge Monday morning as long as he is stable with no complications.   Neuro/Psych  Headaches, PSYCHIATRIC DISORDERS Anxiety Depression    GI/Hepatic Neg liver ROS, GERD  ,  Endo/Other  negative endocrine ROS  Renal/GU negative Renal ROS  negative genitourinary   Musculoskeletal  (+) Arthritis , Fibromyalgia - (on morphine 15mg  TID)  Abdominal   Peds  Hematology  (+) Blood dyscrasia (on brilinta, last dose 09/10/20), ,   Anesthesia Other Findings   Reproductive/Obstetrics                         Anesthesia Physical Anesthesia Plan  ASA:  3  Anesthesia Plan: General   Post-op Pain Management:    Induction: Intravenous  PONV Risk Score and Plan: 2 and Midazolam, Dexamethasone and Ondansetron  Airway  Management Planned: Oral ETT  Additional Equipment:   Intra-op Plan:   Post-operative Plan: Extubation in OR  Informed Consent: I have reviewed the patients History and Physical, chart, labs and discussed the procedure including the risks, benefits and alternatives for the proposed anesthesia with the patient or authorized representative who has indicated his/her understanding and acceptance.     Dental advisory given  Plan Discussed with: CRNA  Anesthesia Plan Comments: (    )      Anesthesia Quick Evaluation

## 2020-09-17 NOTE — Progress Notes (Signed)
Anesthesia Chart Review: Same-day work-up  Follows with cardiology for history of HFpEF, HLD, HTN, CAD s/p multiple PCI.  He is 4 years out from inferior STEMI with RCA PCI, also has distant history of PCI to LAD as well as RCA.  Last seen by Dr. Ellyn Hack 09/03/2020.  Per note, "He is now 4 years out from his MI with RCA PCI.  Distant PCI to the LAD as well as the RCA.  Not really having any active angina symptoms.  Chest pain is described does not sound like angina.  He truly had angina and not necessarily just dyspnea at the time of his MI. Has been on maintenance dose Brilinta due to extensive stents. Plan: Continue current dose of statin (excellent control), beta-blocker and diltiazem (diltiazem is being used initially for rate control but also for antianginal benefit. No longer requiring Imdur.  Was not helping. Temporarily hold maintenance dose of Brilinta for 7 days with complaint of hemoptysis.  Will cover for the last 4 days with 81 mg aspirin. Going forward, okay to hold Brilinta similarly for bleeding or hemoptysis. Okay to hold Brilinta 5 to 7 days preop for surgery or procedures.  (7 days for high risk procedures such as neurologic or spinal procedures as well as organ biopsies)."  History of tobacco abuse, quit smoking 2017.  Recent history of several weeks of intermittent hemoptysis.  CT chest 09/11/2020 showed: Stage IV primary bronchogenic carcinoma as evidenced by a large right lower lobe mass, enlarged adjacent subpleural lymph nodes, right hilar and subcarinal adenopathy and a left adrenal metastasis.  Patient will need day of surgery labs and evaluation  CT super D chest 09/11/2020: IMPRESSION: 1. Stage IV primary bronchogenic carcinoma as evidenced by a large right lower lobe mass, enlarged adjacent subpleural lymph nodes, right hilar and subcarinal adenopathy and a left adrenal metastasis. 2. Cholelithiasis and choledocholithiasis without biliary ductal dilatation. 3. Aortic  atherosclerosis (ICD10-I70.0). Coronary artery calcification. 4. Enlarged pulmonic trunk, indicative of pulmonary arterial hypertension. 5. Emphysema (ICD10-J43.9).  TTE 07/22/2019:  1. Normal LV function; mildly dilated aortic root; no significant  valvular disease.   2. Left ventricular ejection fraction, by estimation, is 55 to 60%. The  left ventricle has normal function. The left ventricle has no regional  wall motion abnormalities. Left ventricular diastolic parameters were  normal.   3. Right ventricular systolic function is normal. The right ventricular  size is normal. Tricuspid regurgitation signal is inadequate for assessing  PA pressure.   4. The mitral valve is normal in structure. Trivial mitral valve  regurgitation. No evidence of mitral stenosis.   5. The aortic valve is normal in structure. Aortic valve regurgitation is  not visualized. No aortic stenosis is present.   6. Aortic dilatation noted. There is mild dilatation of the aortic root  measuring 42 mm.   7. The inferior vena cava is normal in size with greater than 50%  respiratory variability, suggesting right atrial pressure of 3 mmHg.   PCI 07/23/2016: 1. 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 Widely patent LAD stent with minimal nonobstructive disease 3. Patent left main and left circumflex with no obstruction 4. Mild segmental contraction abnormality of the left ventricle with akinesis of the basal inferior wall and hypokinesis of the mid inferior wall, consistent with this patient's presentation of acute inferior MI. The LVEF is preserved at 55%. 5. Successful PCI of the right coronary artery using aspiration thrombectomy and drug-eluting stent  implantation (3.5 x 38 mm Promus DES)   Recommendations: Patient will be monitored in the ICU. He is hemodynamically stable. He is loaded with brilinta 180 mg during the procedure on the Cath Lab table.  Aggrastat will be continued for 12 hours unless there are bleeding complications. The patient would be eligible for discharge Monday morning as long as he is stable with no complications.   Wynonia Musty [facility]/Anesthesiology Phone [phone] 09/17/2020 11:32 AM

## 2020-09-18 ENCOUNTER — Ambulatory Visit (HOSPITAL_COMMUNITY): Payer: PPO | Admitting: Physician Assistant

## 2020-09-18 ENCOUNTER — Other Ambulatory Visit: Payer: Self-pay

## 2020-09-18 ENCOUNTER — Ambulatory Visit (HOSPITAL_COMMUNITY)
Admission: RE | Admit: 2020-09-18 | Discharge: 2020-09-18 | Disposition: A | Discharge status: Home / Self Care | Payer: PPO | Attending: Pulmonary Disease | Admitting: Pulmonary Disease

## 2020-09-18 ENCOUNTER — Encounter (HOSPITAL_COMMUNITY): Payer: Self-pay | Admitting: Pulmonary Disease

## 2020-09-18 ENCOUNTER — Telehealth: Payer: Self-pay | Admitting: Pulmonary Disease

## 2020-09-18 ENCOUNTER — Encounter (HOSPITAL_COMMUNITY)
Admission: RE | Disposition: A | Discharge status: Home / Self Care | Payer: Self-pay | Source: Home / Self Care | Attending: Pulmonary Disease

## 2020-09-18 DIAGNOSIS — Z79899 Other long term (current) drug therapy: Secondary | ICD-10-CM | POA: Insufficient documentation

## 2020-09-18 DIAGNOSIS — C3490 Malignant neoplasm of unspecified part of unspecified bronchus or lung: Secondary | ICD-10-CM

## 2020-09-18 DIAGNOSIS — Z91041 Radiographic dye allergy status: Secondary | ICD-10-CM | POA: Diagnosis not present

## 2020-09-18 DIAGNOSIS — R590 Localized enlarged lymph nodes: Secondary | ICD-10-CM | POA: Diagnosis present

## 2020-09-18 DIAGNOSIS — Z955 Presence of coronary angioplasty implant and graft: Secondary | ICD-10-CM | POA: Diagnosis not present

## 2020-09-18 DIAGNOSIS — R918 Other nonspecific abnormal finding of lung field: Secondary | ICD-10-CM | POA: Insufficient documentation

## 2020-09-18 DIAGNOSIS — I2510 Atherosclerotic heart disease of native coronary artery without angina pectoris: Secondary | ICD-10-CM | POA: Diagnosis not present

## 2020-09-18 DIAGNOSIS — Z419 Encounter for procedure for purposes other than remedying health state, unspecified: Secondary | ICD-10-CM

## 2020-09-18 DIAGNOSIS — I5030 Unspecified diastolic (congestive) heart failure: Secondary | ICD-10-CM | POA: Diagnosis not present

## 2020-09-18 DIAGNOSIS — I110 Hypertensive heart disease with heart failure: Secondary | ICD-10-CM | POA: Insufficient documentation

## 2020-09-18 DIAGNOSIS — C771 Secondary and unspecified malignant neoplasm of intrathoracic lymph nodes: Secondary | ICD-10-CM | POA: Insufficient documentation

## 2020-09-18 DIAGNOSIS — J439 Emphysema, unspecified: Secondary | ICD-10-CM | POA: Diagnosis not present

## 2020-09-18 DIAGNOSIS — Z87891 Personal history of nicotine dependence: Secondary | ICD-10-CM | POA: Diagnosis not present

## 2020-09-18 DIAGNOSIS — I700 Atherosclerosis of aorta: Secondary | ICD-10-CM | POA: Insufficient documentation

## 2020-09-18 DIAGNOSIS — C801 Malignant (primary) neoplasm, unspecified: Secondary | ICD-10-CM | POA: Diagnosis not present

## 2020-09-18 DIAGNOSIS — I5032 Chronic diastolic (congestive) heart failure: Secondary | ICD-10-CM | POA: Diagnosis not present

## 2020-09-18 DIAGNOSIS — R599 Enlarged lymph nodes, unspecified: Secondary | ICD-10-CM | POA: Diagnosis not present

## 2020-09-18 HISTORY — DX: Dyspnea, unspecified: R06.00

## 2020-09-18 HISTORY — PX: VIDEO BRONCHOSCOPY WITH ENDOBRONCHIAL ULTRASOUND: SHX6177

## 2020-09-18 HISTORY — PX: BRONCHIAL NEEDLE ASPIRATION BIOPSY: SHX5106

## 2020-09-18 LAB — POCT I-STAT, CHEM 8
BUN: 16 mg/dL (ref 8–23)
Calcium, Ion: 1.2 mmol/L (ref 1.15–1.40)
Chloride: 103 mmol/L (ref 98–111)
Creatinine, Ser: 1.1 mg/dL (ref 0.61–1.24)
Glucose, Bld: 113 mg/dL — ABNORMAL HIGH (ref 70–99)
HCT: 38 % — ABNORMAL LOW (ref 39.0–52.0)
Hemoglobin: 12.9 g/dL — ABNORMAL LOW (ref 13.0–17.0)
Potassium: 4.3 mmol/L (ref 3.5–5.1)
Sodium: 141 mmol/L (ref 135–145)
TCO2: 27 mmol/L (ref 22–32)

## 2020-09-18 LAB — BASIC METABOLIC PANEL
Anion gap: 8 (ref 5–15)
BUN: 14 mg/dL (ref 8–23)
CO2: 26 mmol/L (ref 22–32)
Calcium: 8.4 mg/dL — ABNORMAL LOW (ref 8.9–10.3)
Chloride: 103 mmol/L (ref 98–111)
Creatinine, Ser: 1.21 mg/dL (ref 0.61–1.24)
GFR, Estimated: >60 mL/min (ref >60)
Glucose, Bld: 116 mg/dL — ABNORMAL HIGH (ref 70–99)
Potassium: 3.8 mmol/L (ref 3.5–5.1)
Sodium: 137 mmol/L (ref 135–145)

## 2020-09-18 SURGERY — BRONCHOSCOPY, WITH EBUS
Anesthesia: General

## 2020-09-18 MED ORDER — PROPOFOL 10 MG/ML IV BOLUS
INTRAVENOUS | Status: DC | PRN
  Administered 2020-09-18: 130 mg via INTRAVENOUS

## 2020-09-18 MED ORDER — LACTATED RINGERS IV SOLN: INTRAVENOUS | Status: DC | PRN

## 2020-09-18 MED ORDER — CHLORHEXIDINE GLUCONATE 0.12 % MT SOLN
15.0000 mL | Freq: Once | OROMUCOSAL | Status: AC
  Administered 2020-09-18: 15 mL via OROMUCOSAL
  Filled 2020-09-18: qty 15

## 2020-09-18 MED ORDER — DEXAMETHASONE SODIUM PHOSPHATE 10 MG/ML IJ SOLN
INTRAMUSCULAR | Status: DC | PRN
  Administered 2020-09-18: 5 mg via INTRAVENOUS

## 2020-09-18 MED ORDER — ROCURONIUM BROMIDE 10 MG/ML (PF) SYRINGE
PREFILLED_SYRINGE | INTRAVENOUS | Status: DC | PRN
  Administered 2020-09-18: 100 mg via INTRAVENOUS

## 2020-09-18 MED ORDER — FENTANYL CITRATE (PF) 100 MCG/2ML IJ SOLN: 25.0000 ug | INTRAMUSCULAR | Status: DC | PRN

## 2020-09-18 MED ORDER — PHENYLEPHRINE 40 MCG/ML (10ML) SYRINGE FOR IV PUSH (FOR BLOOD PRESSURE SUPPORT)
PREFILLED_SYRINGE | INTRAVENOUS | Status: DC | PRN
  Administered 2020-09-18: 200 ug via INTRAVENOUS

## 2020-09-18 MED ORDER — FENTANYL CITRATE (PF) 250 MCG/5ML IJ SOLN
INTRAMUSCULAR | Status: DC | PRN
  Administered 2020-09-18 (×2): 50 ug via INTRAVENOUS

## 2020-09-18 MED ORDER — LIDOCAINE 2% (20 MG/ML) 5 ML SYRINGE
INTRAMUSCULAR | Status: DC | PRN
  Administered 2020-09-18: 60 mg via INTRAVENOUS

## 2020-09-18 MED ORDER — SUGAMMADEX SODIUM 200 MG/2ML IV SOLN
INTRAVENOUS | Status: DC | PRN
  Administered 2020-09-18: 400 mg via INTRAVENOUS

## 2020-09-18 MED ORDER — PHENYLEPHRINE HCL-NACL 10-0.9 MG/250ML-% IV SOLN
INTRAVENOUS | Status: DC | PRN
  Administered 2020-09-18: 35 ug/min via INTRAVENOUS

## 2020-09-18 MED ORDER — ONDANSETRON HCL 4 MG/2ML IJ SOLN
INTRAMUSCULAR | Status: DC | PRN
  Administered 2020-09-18: 4 mg via INTRAVENOUS

## 2020-09-18 MED ORDER — EPHEDRINE SULFATE-NACL 50-0.9 MG/10ML-% IV SOSY
PREFILLED_SYRINGE | INTRAVENOUS | Status: DC | PRN
  Administered 2020-09-18 (×2): 5 mg via INTRAVENOUS

## 2020-09-18 SURGICAL SUPPLY — 53 items
ADAPTER BRONCH F/PENTAX (ADAPTER) ×3 IMPLANT
ADAPTER VALVE BIOPSY EBUS (MISCELLANEOUS) IMPLANT
ADPR BSCP EDG PNTX (ADAPTER) ×2
ADPTR VALVE BIOPSY EBUS (MISCELLANEOUS)
BRUSH CYTOL CELLEBRITY 1.5X140 (MISCELLANEOUS) ×3 IMPLANT
BRUSH SUPERTRAX BIOPSY (INSTRUMENTS) IMPLANT
BRUSH SUPERTRAX NDL-TIP CYTO (INSTRUMENTS) ×3 IMPLANT
CANISTER SUCT 3000ML PPV (MISCELLANEOUS) ×3 IMPLANT
CHANNEL WORK EXTEND EDGE 180 (KITS) IMPLANT
CHANNEL WORK EXTEND EDGE 45 (KITS) IMPLANT
CHANNEL WORK EXTEND EDGE 90 (KITS) IMPLANT
CONT SPEC 4OZ CLIKSEAL STRL BL (MISCELLANEOUS) ×3 IMPLANT
COVER BACK TABLE 60X90IN (DRAPES) ×3 IMPLANT
COVER DOME SNAP 22 D (MISCELLANEOUS) ×3 IMPLANT
FILTER STRAW FLUID ASPIR (MISCELLANEOUS) IMPLANT
FORCEPS BIOP RJ4 1.8 (CUTTING FORCEPS) IMPLANT
FORCEPS BIOP SUPERTRX PREMAR (INSTRUMENTS) ×3 IMPLANT
GAUZE SPONGE 4X4 12PLY STRL (GAUZE/BANDAGES/DRESSINGS) ×3 IMPLANT
GLOVE BIO SURGEON STRL SZ7.5 (GLOVE) ×3 IMPLANT
GLOVE SURG SS PI 7.5 STRL IVOR (GLOVE) ×6 IMPLANT
GOWN STRL REUS W/ TWL LRG LVL3 (GOWN DISPOSABLE) ×4 IMPLANT
GOWN STRL REUS W/TWL LRG LVL3 (GOWN DISPOSABLE) ×6
KIT CLEAN ENDO COMPLIANCE (KITS) ×6 IMPLANT
KIT LOCATABLE GUIDE (CANNULA) IMPLANT
KIT MARKER FIDUCIAL DELIVERY (KITS) IMPLANT
KIT PROCEDURE EDGE 180 (KITS) IMPLANT
KIT PROCEDURE EDGE 45 (KITS) IMPLANT
KIT PROCEDURE EDGE 90 (KITS) IMPLANT
KIT TURNOVER KIT B (KITS) ×3 IMPLANT
MARKER SKIN DUAL TIP RULER LAB (MISCELLANEOUS) ×3 IMPLANT
NDL EBUS SONO TIP PENTAX (NEEDLE) ×2 IMPLANT
NDL SUPERTRX PREMARK BIOPSY (NEEDLE) ×2 IMPLANT
NEEDLE EBUS SONO TIP PENTAX (NEEDLE) ×3 IMPLANT
NEEDLE SUPERTRX PREMARK BIOPSY (NEEDLE) ×3 IMPLANT
NS IRRIG 1000ML POUR BTL (IV SOLUTION) ×3 IMPLANT
OIL SILICONE PENTAX (PARTS (SERVICE/REPAIRS)) ×3 IMPLANT
PAD ARMBOARD 7.5X6 YLW CONV (MISCELLANEOUS) ×6 IMPLANT
PATCHES PATIENT (LABEL) ×9 IMPLANT
SOL ANTI FOG 6CC (MISCELLANEOUS) ×2 IMPLANT
SOLUTION ANTI FOG 6CC (MISCELLANEOUS) ×1
SYR 20CC LL (SYRINGE) ×6 IMPLANT
SYR 20ML ECCENTRIC (SYRINGE) ×6 IMPLANT
SYR 50ML SLIP (SYRINGE) ×3 IMPLANT
SYR 5ML LUER SLIP (SYRINGE) ×3 IMPLANT
TOWEL OR 17X24 6PK STRL BLUE (TOWEL DISPOSABLE) ×3 IMPLANT
TRAP SPECIMEN MUCOUS 40CC (MISCELLANEOUS) IMPLANT
TUBE CONNECTING 20X1/4 (TUBING) ×6 IMPLANT
UNDERPAD 30X30 (UNDERPADS AND DIAPERS) ×3 IMPLANT
VALVE BIOPSY  SINGLE USE (MISCELLANEOUS) ×3
VALVE BIOPSY SINGLE USE (MISCELLANEOUS) ×2 IMPLANT
VALVE DISPOSABLE (MISCELLANEOUS) ×3 IMPLANT
VALVE SUCTION BRONCHIO DISP (MISCELLANEOUS) ×3 IMPLANT
WATER STERILE IRR 1000ML POUR (IV SOLUTION) ×3 IMPLANT

## 2020-09-18 NOTE — Transfer of Care (Signed)
Immediate Anesthesia Transfer of Care Note  Patient: [name] [name]  Procedure(s) Performed: VIDEO BRONCHOSCOPY WITH ENDOBRONCHIAL ULTRASOUND BRONCHIAL NEEDLE ASPIRATION BIOPSIES  Patient Location: PACU  Anesthesia Type:General  Level of Consciousness: awake, alert  and oriented  Airway & Oxygen Therapy: Patient Spontanous Breathing and Patient connected to face mask oxygen  Post-op Assessment: Report given to RN, Post -op Vital signs reviewed and stable and Patient moving all extremities X 4  Post vital signs: Reviewed and stable  Last Vitals:  Vitals Value Taken Time  BP 105/67 09/18/20 0900  Temp    Pulse 64 09/18/20 0900  Resp 15 09/18/20 0900  SpO2 100 % 09/18/20 0900  Vitals shown include unvalidated device data.  Last Pain:  Vitals:   09/18/20 0651  TempSrc:   PainSc: 0-No pain         Complications: No notable events documented.

## 2020-09-18 NOTE — Anesthesia Procedure Notes (Signed)
Procedure Name: Intubation Date/Time: 09/18/2020 8:07 AM Performed by: Gaylene Brooks, CRNA Pre-anesthesia Checklist: Patient identified, Emergency Drugs available, Suction available and Patient being monitored Patient Re-evaluated:Patient Re-evaluated prior to induction Oxygen Delivery Method: Circle System Utilized Preoxygenation: Pre-oxygenation with 100% oxygen Induction Type: IV induction Ventilation: Mask ventilation without difficulty and Oral airway inserted - appropriate to patient size Laryngoscope Size: Sabra Heck and 2 Grade View: Grade I Tube type: Oral Tube size: 8.5 mm Number of attempts: 1 Airway Equipment and Method: Stylet and Oral airway Placement Confirmation: ETT inserted through vocal cords under direct vision, positive ETCO2 and breath sounds checked- equal and bilateral Secured at: 22 cm Tube secured with: Tape Dental Injury: Teeth and Oropharynx as per pre-operative assessment

## 2020-09-18 NOTE — Op Note (Signed)
Video Bronchoscopy with Endobronchial Ultrasound Procedure Note  Date of Operation: 09/18/2020  Pre-op Diagnosis: Lung mass, adenopathy   Post-op Diagnosis: Lung mass, adenopathy   Surgeon: Octavio Graves Patton Rabinovich,DO   Assistants: None   Anesthesia: General endotracheal anesthesia  Operation: Flexible video fiberoptic bronchoscopy with endobronchial ultrasound and biopsies.  Estimated Blood Loss: Minimal  Complications: None   Indications and History: [name] [name] is a 65 y.o. male with lung mass, adenopathy.  The risks, benefits, complications, treatment options and expected outcomes were discussed with the patient.  The possibilities of pneumothorax, pneumonia, reaction to medication, pulmonary aspiration, perforation of a viscus, bleeding, failure to diagnose a condition and creating a complication requiring transfusion or operation were discussed with the patient who freely signed the consent.    Description of Procedure: The patient was examined in the preoperative area and history and data from the preprocedure consultation were reviewed. It was deemed appropriate to proceed.  The patient was taken to [facility] endoscopy room 2, identified as [name] [name] and the procedure verified as Flexible Video Fiberoptic Bronchoscopy.  A Time Out was held and the above information confirmed. After being taken to the operating room general anesthesia was initiated and the patient  was orally intubated. The video fiberoptic bronchoscope was introduced via the endotracheal tube and a general inspection was performed which showed normal right and left lung anatomy, abnormal and very mucosa within the right middle lobe and right lower lobe segments, possibly some small amount of tumor within the right middle lobe medial segment.. The standard scope was then withdrawn and the endobronchial ultrasound was used to identify and characterize the peritracheal, hilar and bronchial lymph nodes. Inspection showed enlarged  subcarinal adenopathy, enlarged right hilar adenopathy. Using real-time ultrasound guidance Wang needle biopsies were take from Station 7 nodes and were sent for cytology.  Standard therapeutic bronchoscope was reinserted and the patient's airway and used for aspiration of the bilateral mainstem's removal of any remaining blood clots and debris.  All distal subsegments were patent at the termination of the procedure except for the abnormalities described above in the right middle lobe medial segment.  There was no evidence of active bleeding.  The patient tolerated the procedure well without apparent complications. There was no significant blood loss. The bronchoscope was withdrawn. Anesthesia was reversed and the patient was taken to the PACU for recovery.   Samples: 1. Wang needle biopsies from station 7 node  Plans:  The patient will be discharged from the PACU to home when recovered from anesthesia. We will review the cytology, pathology and microbiology results with the patient when they become available. Outpatient followup will be with [name] Plowman L Patrisia Faeth,DO.   Garner Nash, DO [facility] 09/18/2020 9:01 AM

## 2020-09-18 NOTE — Telephone Encounter (Signed)
Called and r/s pt for sooner appt on 7/27 w/ Dr. Valeta Harms after PET (7/21) and MRI (7/22). Nothing further needed at this time.

## 2020-09-18 NOTE — Interval H&P Note (Signed)
History and Physical Interval Note:  09/18/2020 7:27 AM  [name] [name]  has presented today for surgery, with the diagnosis of lung mass.  The various methods of treatment have been discussed with the patient and family. After consideration of risks, benefits and other options for treatment, the patient has consented to  Procedure(s): VIDEO BRONCHOSCOPY WITH ENDOBRONCHIAL NAVIGATION (N/A) VIDEO BRONCHOSCOPY WITH ENDOBRONCHIAL ULTRASOUND (N/A) as a surgical intervention.  The patient's history has been reviewed, patient examined, no change in status, stable for surgery.  I have reviewed the patient's chart and labs.  Questions were answered to the patient's satisfaction.     Hazen

## 2020-09-18 NOTE — Telephone Encounter (Signed)
PCCM:  New diagnosis of malignancy.  Bronchoscopy today.  Final path pending.  Imaging concerning for advanced stage IV malignancy.  MRI brain ordered, PET scan ordered. Referral to Dr. Julien Nordmann from medical oncology placed.  Garner Nash, DO [facility] 09/18/2020 9:08 AM

## 2020-09-19 NOTE — Anesthesia Postprocedure Evaluation (Signed)
Anesthesia Post Note  Patient: [name] [name]  Procedure(s) Performed: VIDEO BRONCHOSCOPY WITH ENDOBRONCHIAL ULTRASOUND BRONCHIAL NEEDLE ASPIRATION BIOPSIES     Patient location during evaluation: PACU Anesthesia Type: General Level of consciousness: awake and alert Pain management: pain level controlled Vital Signs Assessment: post-procedure vital signs reviewed and stable Respiratory status: spontaneous breathing, nonlabored ventilation, respiratory function stable and patient connected to nasal cannula oxygen Cardiovascular status: blood pressure returned to baseline and stable Postop Assessment: no apparent nausea or vomiting Anesthetic complications: no   No notable events documented.  Last Vitals:  Vitals:   09/18/20 0915 09/18/20 0923  BP: (!) 112/58 (!) 104/54  Pulse: 64 64  Resp: 13 13  Temp:  (!) 36.1 C  SpO2: 98% 95%    Last Pain:  Vitals:   09/18/20 0923  TempSrc:   PainSc: 7                  Muhamad Serano L Marsena Taff

## 2020-09-21 ENCOUNTER — Other Ambulatory Visit (HOSPITAL_COMMUNITY): Payer: PPO

## 2020-09-21 ENCOUNTER — Telehealth: Payer: Self-pay | Admitting: Acute Care

## 2020-09-21 ENCOUNTER — Encounter: Payer: Self-pay | Admitting: *Deleted

## 2020-09-21 DIAGNOSIS — R918 Other nonspecific abnormal finding of lung field: Secondary | ICD-10-CM

## 2020-09-21 NOTE — Progress Notes (Signed)
I received referral on Ms. [name] [name].  I updated new patient coordinator to call and schedule with [name] on 09/29/20.

## 2020-09-21 NOTE — Telephone Encounter (Signed)
I have called the patient with the results of his biopsy done by Dr. Valeta Harms.  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  The patient verbalized understanding.  I explained to the patient that we would refer him to oncology who will explain to him options for treatment.  The patient is scheduled for a PET scan, and an MRI brain in addition to follow-up with Dr. Valeta Harms on 7/27. The patient states he has issues with his memory, and has asked me to also call his sister [name] to explain the results of the biopsy..  She is his caregiver and there is documentation in the chart it is okay to speak with her.    I attempted to call Ms. Caudle at [phone].  There was no answer however I was able to leave a message requesting that she call the office for the results of her brothers pathology.  I have left the contact number in the body of her voicemail.. Triage, please place order for ambulatory referral to oncology specifically Dr. Lorna Few. 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 Valeta Harms.  Of a right upper lobe mass. Patient has not had PFTs since 2018, please place order for pulmonary function tests, in addition to sending referral to oncology. Thank you

## 2020-09-22 ENCOUNTER — Other Ambulatory Visit: Payer: PPO

## 2020-09-22 ENCOUNTER — Telehealth: Payer: Self-pay | Admitting: Acute Care

## 2020-09-22 ENCOUNTER — Telehealth: Payer: Self-pay | Admitting: Physician Assistant

## 2020-09-22 DIAGNOSIS — C3491 Malignant neoplasm of unspecified part of right bronchus or lung: Secondary | ICD-10-CM

## 2020-09-22 NOTE — Telephone Encounter (Signed)
Received a new pt referral from Dr. Valeta Harms for Right lower lobe lung mass. [name] [name] has been scheduled to see [name] on 7/26 at 130pm w/labs at 1pm. I cld and lft the appt date and time on the pt's vm.

## 2020-09-22 NOTE — Telephone Encounter (Signed)
ATC Brenda left a voicemail to return call to office. Pended Ambulatory Oncology order and PFT order.

## 2020-09-22 NOTE — Telephone Encounter (Signed)
Called and spoke with [name], Patient's Sister (DPR).  Judson Roch, NP results and recommendations given.  Understanding stated. Patient scheduled PFT 09/25/20 at 2pm.  [name] is aware Patient needs to bring covid vaccine card.  Patient is scheduled MRI/PET earlier 09/25/20. Advised to reschedule PFT, if needed. Oncology referral placed. Nothing further at this time.

## 2020-09-22 NOTE — Addendum Note (Signed)
Addended by: Elton Sin on: 09/22/2020 02:09 PM   Modules accepted: Orders

## 2020-09-22 NOTE — Telephone Encounter (Signed)
See other encounter.

## 2020-09-22 NOTE — Addendum Note (Signed)
Addended by: Gavin Potters R on: 09/22/2020 09:27 AM   Modules accepted: Orders

## 2020-09-22 NOTE — Telephone Encounter (Signed)
[name] [name] returning a phone call. [name] [name] can be reached at [phone] or [phone]
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
## 2020-09-22 NOTE — Addendum Note (Signed)
Addended by: Elton Sin on: 09/22/2020 02:08 PM   Modules accepted: Orders

## 2020-09-23 DIAGNOSIS — M47812 Spondylosis without myelopathy or radiculopathy, cervical region: Secondary | ICD-10-CM | POA: Diagnosis not present

## 2020-09-23 DIAGNOSIS — Z79891 Long term (current) use of opiate analgesic: Secondary | ICD-10-CM | POA: Diagnosis not present

## 2020-09-23 DIAGNOSIS — M150 Primary generalized (osteo)arthritis: Secondary | ICD-10-CM | POA: Diagnosis not present

## 2020-09-23 DIAGNOSIS — G894 Chronic pain syndrome: Secondary | ICD-10-CM | POA: Diagnosis not present

## 2020-09-24 ENCOUNTER — Encounter (HOSPITAL_COMMUNITY)
Admission: RE | Admit: 2020-09-24 | Discharge: 2020-09-24 | Disposition: A | Discharge status: Home / Self Care | Payer: PPO | Source: Ambulatory Visit | Attending: Pulmonary Disease | Admitting: Pulmonary Disease

## 2020-09-24 ENCOUNTER — Other Ambulatory Visit: Payer: Self-pay

## 2020-09-24 DIAGNOSIS — C3490 Malignant neoplasm of unspecified part of unspecified bronchus or lung: Secondary | ICD-10-CM | POA: Diagnosis not present

## 2020-09-24 LAB — GLUCOSE, CAPILLARY: Glucose-Capillary: 123 mg/dL — ABNORMAL HIGH (ref 70–99)

## 2020-09-24 IMAGING — PT NM PET TUM IMG INITIAL (PI) SKULL BASE T - THIGH
8 series · 25 of 25 positions shown · non-contrast
Comparison: chest CT [DATE]

CLINICAL DATA: Initial treatment strategy for non-small cell lung
cancer.

EXAM:
NUCLEAR MEDICINE PET SKULL BASE TO THIGH
TECHNIQUE: 11.1 mCi F-18 FDG was injected intravenously. Full-ring PET imaging
was performed from the skull base to thigh after the radiotracer. CT
data was obtained and used for attenuation correction and anatomic
localization.
Fasting blood glucose: 120 mg/dl

[Series 3: pet sk_thigh ac · axial · 5.0mm · 4.07mm/px · z∈[-1043,-35]mm · 5 of 253 slices shown]
[im 1/253]
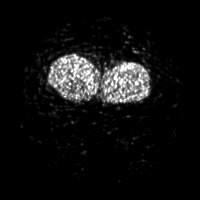
[im 64/253]
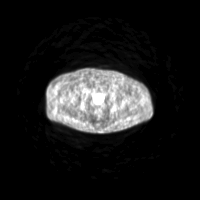
[im 127/253]
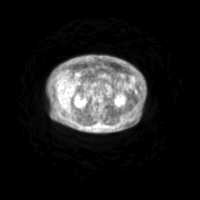
[im 190/253]
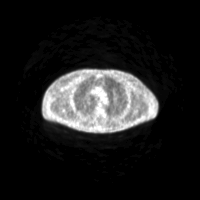
[im 253/253]
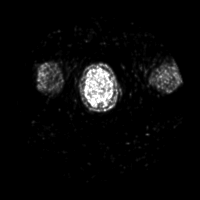

[Series 4: ct sk_thigh 5.0 bf37 · axial · 5.0mm · 0.98mm/px · z∈[-1043,-35]mm · 5 of 253 slices shown]
[im 1/253]
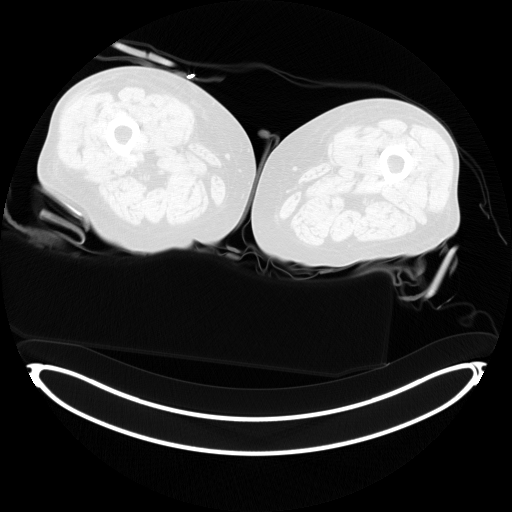
[im 64/253]
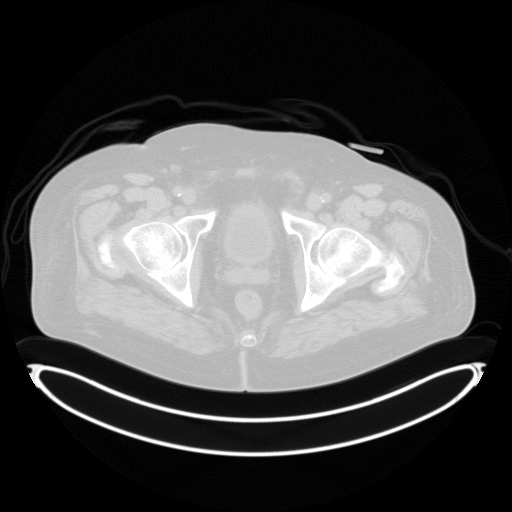
[im 127/253]
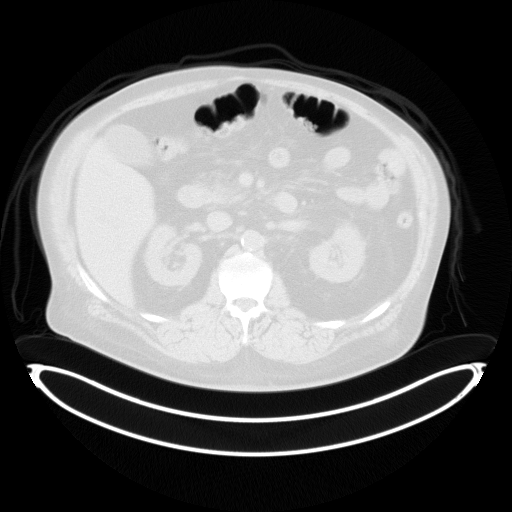
[im 190/253]
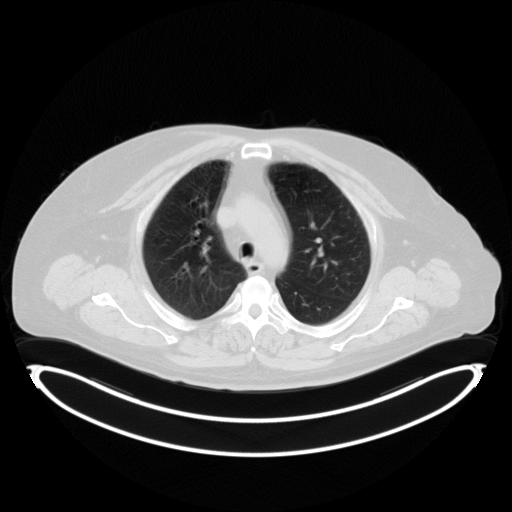
[im 253/253  brain]
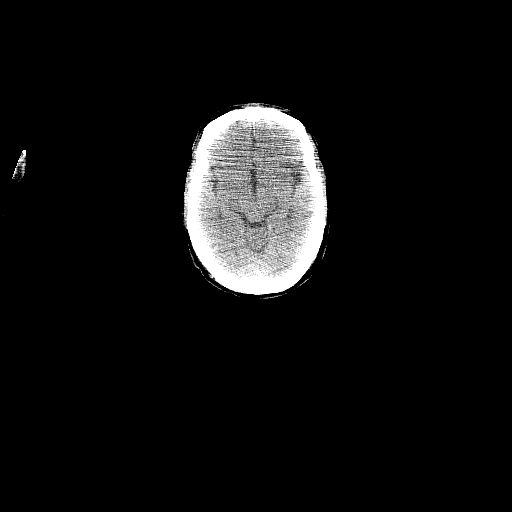

[Series 5: pet sk_thigh nac · axial · 5.0mm · 4.07mm/px · z∈[-1043,-35]mm · 6 of 253 slices shown]
[im 1/253]
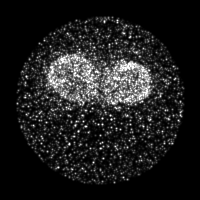
[im 51/253]
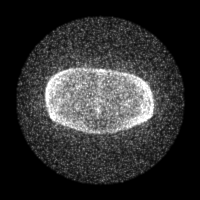
[im 101/253]
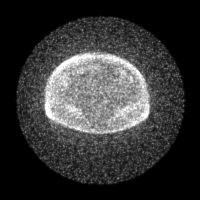
[im 152/253]
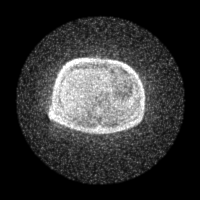
[im 202/253]
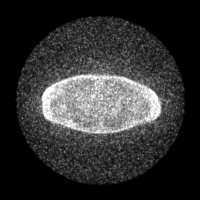
[im 253/253]
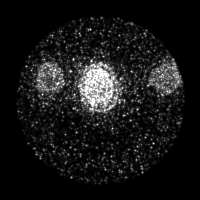

[Series 8: ct sk_thigh 5.0 br59 lung_bone · axial · 5.0mm · 0.70mm/px · 1 of 68 slices shown]
[im 1/68]
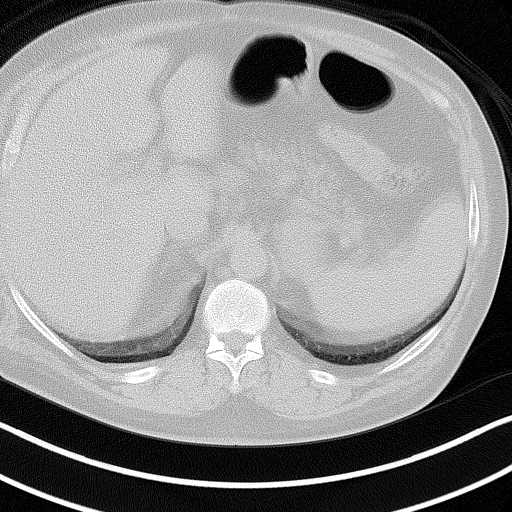

[Series 603: <mip collection> · coronal · 2.09mm/px · 1 of 32 slices shown]
[im 1/32]
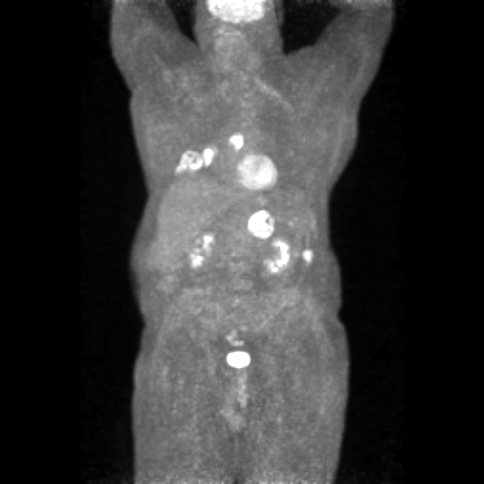

[Series 604: fused cor · 1 of 25 slices shown]
[im 1/25]
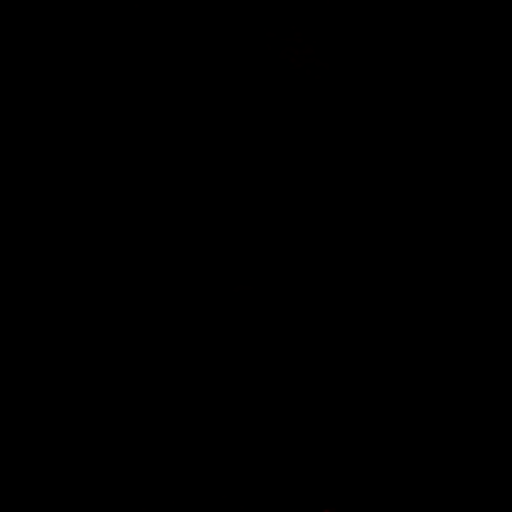

[Series 605: range-ct sk_thigh 5.0 bf37-tra-<alpha range> · 5 of 247 slices shown]
[im 1/247]
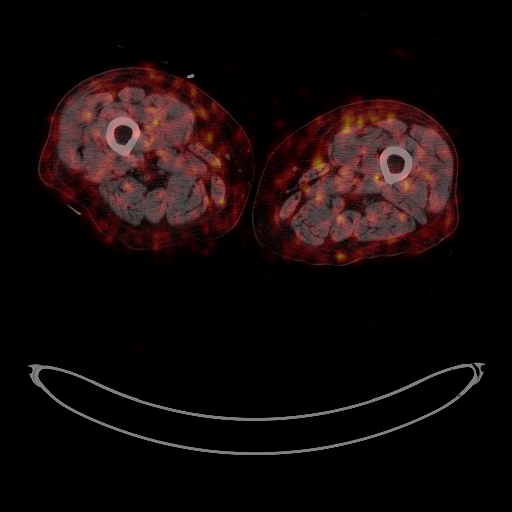
[im 62/247]
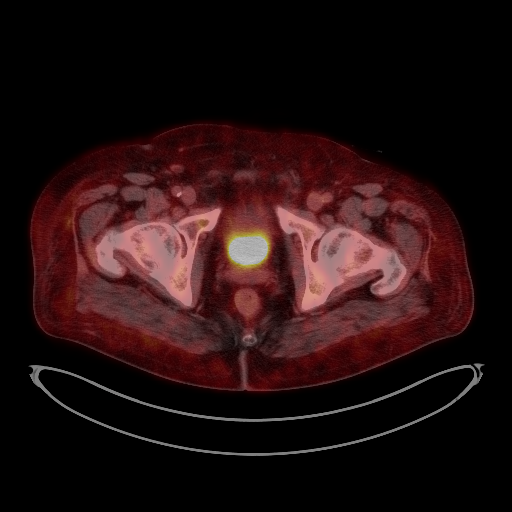
[im 124/247]
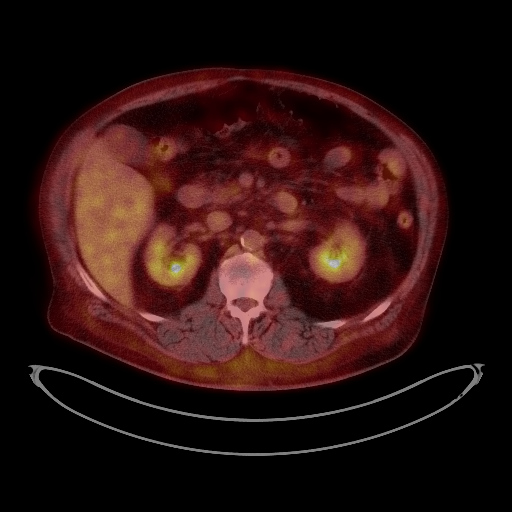
[im 185/247]
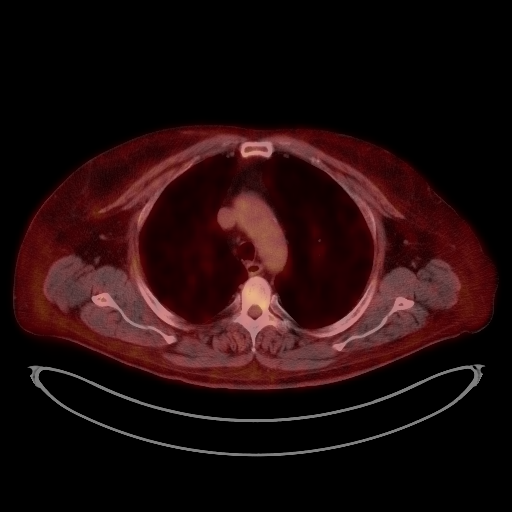
[im 247/247]
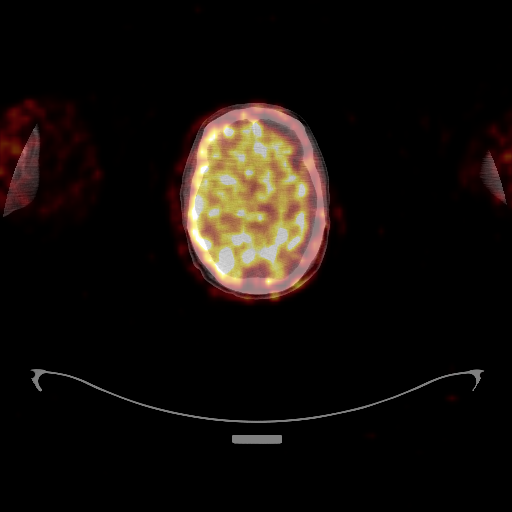

[Series 1093: results mm oncology reading · 1.0mm · 1.01mm/px · 1 of 6 slices shown]
[im 1/6]
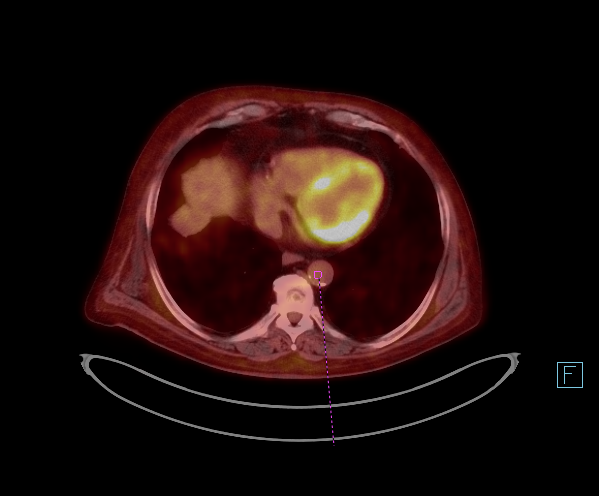

[25 of 25 positions shown; findings below may reference images not displayed]

FINDINGS: Mediastinal blood pool activity: SUV max

Liver activity: SUV max NA

NECK: No hypermetabolic lymph nodes in the neck.

Incidental CT findings: none

CHEST: Within the RIGHT lower lobe, rounded mass has intense
peripheral hypermetabolic activity consistent with malignancy with
central necrosis. Mass measures 5.3 x 4.6 cm with SUV max equal to
10.3.

Hypermetabolic nodular enlargement at the RIGHT hilum. For example
2.5 cm nodular lesion (image 84/4) with SUV max equal 10.5.

There is enlarged hypermetabolic subcarinal node measuring 2.3 cm
with SUV max equal 13.8.

No additional mediastinal nodes are present. Hypermetabolic
supraclavicular nodes.

There is a small nodule within the RIGHT lower lobe superior to the
rounded mass measuring 5 mm (image 83) without metabolic activity.

Incidental CT findings: none

ABDOMEN/PELVIS: Rounded peripherally hypermetabolic mass of the
RIGHT adrenal gland measures 5.6 by 4.6 cm with intense peripheral
metabolic activity (SUV max equal 16

NO metabolic activity in liver. No hypermetabolic upper abdominal
pelvic lymph nodes.

Solitary focal activity along the descending colon without CT
correlation (image 134, SUV max equal 8.2).

Incidental CT findings: none

SKELETON: No focal hypermetabolic activity to suggest skeletal
metastasis.

Incidental CT findings: none
IMPRESSION: 1. Peripherally hypermetabolic mass in the RIGHT lower lobe
consistent with primary bronchogenic carcinoma.
2. RIGHT hilar and subcarinal hypermetabolic metastatic adenopathy.
3. Small satellite nodule in the RIGHT lower lobe is too small to
characterize by FDG PET (5 mm).
4. Single focus of isolated metabolic activity in the descending
colon. Recommend colonoscopy to exclude a hypermetabolic polyp.

## 2020-09-24 MED ORDER — FLUDEOXYGLUCOSE F - 18 (FDG) INJECTION
11.1000 | Freq: Once | INTRAVENOUS | Status: AC
  Administered 2020-09-24: 11.1 via INTRAVENOUS

## 2020-09-25 ENCOUNTER — Ambulatory Visit (HOSPITAL_COMMUNITY)
Admission: RE | Admit: 2020-09-25 | Discharge: 2020-09-25 | Disposition: A | Discharge status: Home / Self Care | Payer: PPO | Source: Ambulatory Visit | Attending: Pulmonary Disease | Admitting: Pulmonary Disease

## 2020-09-25 ENCOUNTER — Telehealth: Payer: Self-pay | Admitting: Physician Assistant

## 2020-09-25 ENCOUNTER — Ambulatory Visit (INDEPENDENT_AMBULATORY_CARE_PROVIDER_SITE_OTHER): Payer: PPO | Admitting: Pulmonary Disease

## 2020-09-25 DIAGNOSIS — C3490 Malignant neoplasm of unspecified part of unspecified bronchus or lung: Secondary | ICD-10-CM | POA: Insufficient documentation

## 2020-09-25 DIAGNOSIS — J019 Acute sinusitis, unspecified: Secondary | ICD-10-CM | POA: Diagnosis not present

## 2020-09-25 DIAGNOSIS — C3491 Malignant neoplasm of unspecified part of right bronchus or lung: Secondary | ICD-10-CM | POA: Diagnosis not present

## 2020-09-25 DIAGNOSIS — G319 Degenerative disease of nervous system, unspecified: Secondary | ICD-10-CM | POA: Diagnosis not present

## 2020-09-25 LAB — PULMONARY FUNCTION TEST
DL/VA % pred: 111 %
DL/VA: 4.69 ml/min/mmHg/L
DLCO cor % pred: 98 %
DLCO cor: 25.03 ml/min/mmHg
DLCO unc % pred: 98 %
DLCO unc: 25.03 ml/min/mmHg
FEF 25-75 Post: 2.77 L/sec
FEF 25-75 Pre: 1.36 L/sec
FEF2575-%Change-Post: 104 %
FEF2575-%Pred-Post: 107 %
FEF2575-%Pred-Pre: 52 %
FEV1-%Change-Post: 23 %
FEV1-%Pred-Post: 71 %
FEV1-%Pred-Pre: 57 %
FEV1-Post: 2.31 L
FEV1-Pre: 1.87 L
FEV1FVC-%Change-Post: -1 %
FEV1FVC-%Pred-Pre: 97 %
FEV6-%Change-Post: 24 %
FEV6-%Pred-Post: 77 %
FEV6-%Pred-Pre: 62 %
FEV6-Post: 3.17 L
FEV6-Pre: 2.55 L
FEV6FVC-%Change-Post: 0 %
FEV6FVC-%Pred-Post: 105 %
FEV6FVC-%Pred-Pre: 105 %
FVC-%Change-Post: 24 %
FVC-%Pred-Post: 73 %
FVC-%Pred-Pre: 59 %
FVC-Post: 3.18 L
FVC-Pre: 2.55 L
Post FEV1/FVC ratio: 73 %
Post FEV6/FVC ratio: 100 %
Pre FEV1/FVC ratio: 73 %
Pre FEV6/FVC Ratio: 100 %
RV % pred: 147 %
RV: 3.27 L
TLC % pred: 97 %
TLC: 6.46 L

## 2020-09-25 IMAGING — MR MR HEAD WO/W CM
17 series · 48 of 48 positions shown · IV contrast (gadavist)
Comparison: CT head without contrast [DATE]

CLINICAL DATA: Non-small cell lung cancer. Staging.

EXAM:
MRI HEAD WITHOUT AND WITH CONTRAST
TECHNIQUE: Multiplanar, multiecho pulse sequences of the brain and surrounding
structures were obtained without and with intravenous contrast.
CONTRAST:  10mL GADAVIST GADOBUTROL 1 MMOL/ML IV SOLN

[Series 5: DWI · axial · 3.0mm · 1.48mm/px · z∈[-84,+56]mm · 4 of 96 slices shown (1 of 2)]
[im 1/96]
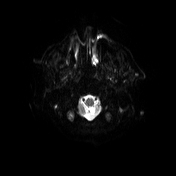
[im 32/96]
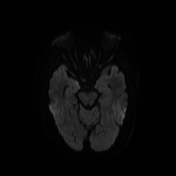
[im 64/96]
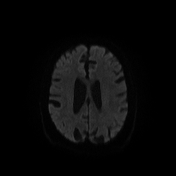
[im 96/96]
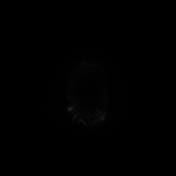

[Series 6: DWI · axial · 3.0mm · 1.48mm/px · z∈[-84,+56]mm · 2 of 48 slices shown (2 of 2)]
[im 1/48]
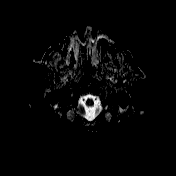
[im 48/48]
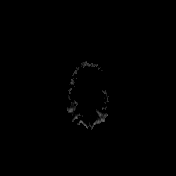

[Series 7: T1 · sagittal · 5.0mm · 0.75mm/px · 1 of 24 slices shown (1 of 4)]
[im 1/24]
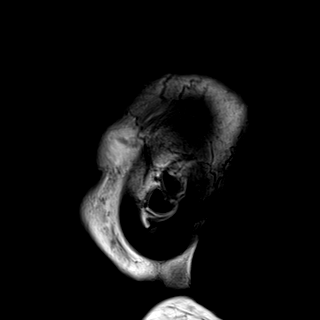

[Series 8: T2 · axial · 5.0mm · 0.62mm/px · 1 of 26 slices shown]
[im 1/26]
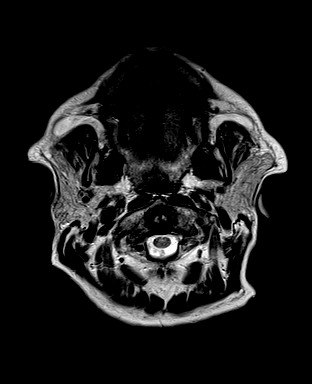

[Series 9: swi_images · axial · 3.0mm · 0.75mm/px · z∈[-124,+88]mm · 4 of 72 slices shown]
[im 1/72]
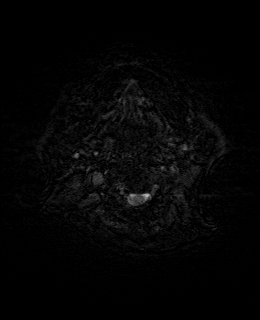
[im 24/72]
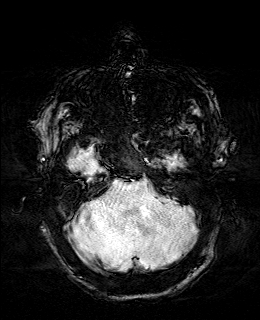
[im 48/72]
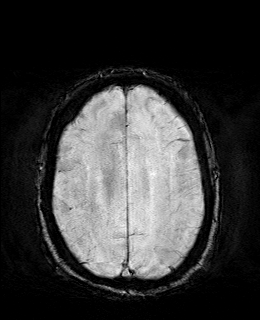
[im 72/72]
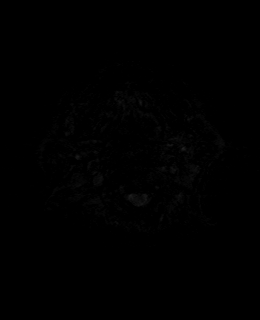

[Series 11: FLAIR · axial · 3.0mm · 0.75mm/px · z∈[-95,+58]mm · 3 of 52 slices shown]
[im 1/52]
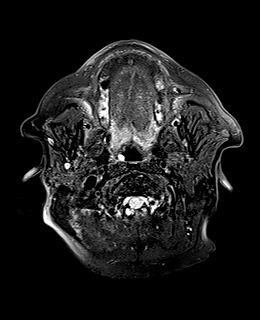
[im 26/52]
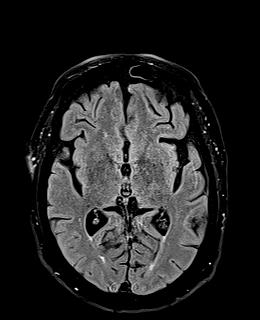
[im 52/52]
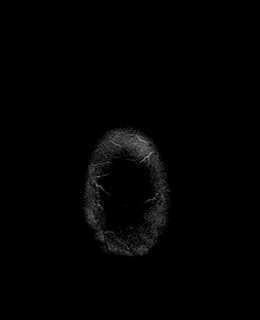

[Series 12: T1 · axial · 1.0mm · 0.94mm/px · z∈[-90,+53]mm · 8 of 144 slices shown (2 of 4)]
[im 1/144]
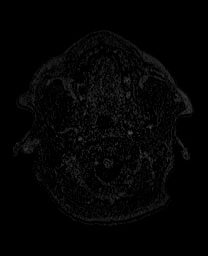
[im 21/144]
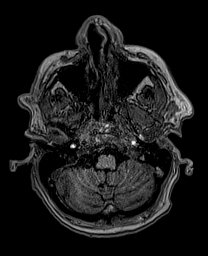
[im 41/144]
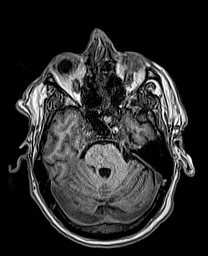
[im 62/144]
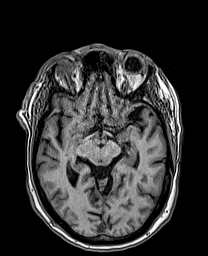
[im 82/144]
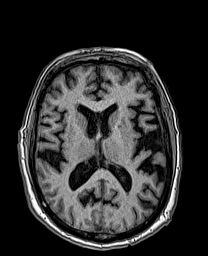
[im 103/144]
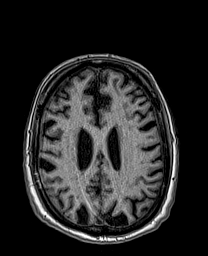
[im 123/144]
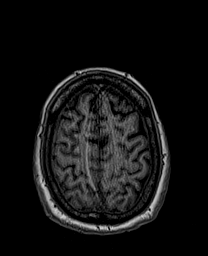
[im 144/144]
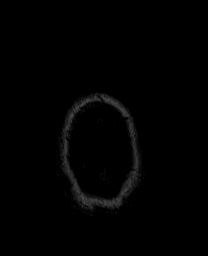

[Series 13: cor dwi_tracew · coronal · 5.0mm · 1.53mm/px · 3 of 56 slices shown]
[im 1/56]
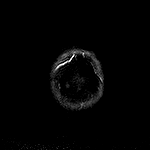
[im 28/56]
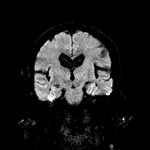
[im 56/56]
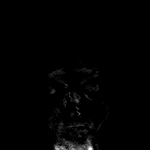

[Series 14: cor dwi_adc · coronal · 5.0mm · 1.53mm/px · 1 of 27 slices shown]
[im 1/27]
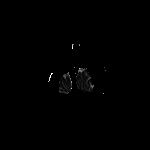

[Series 15: resolve dwi_tracew · axial · 5.0mm · 1.67mm/px · z∈[-97,+58]mm · 3 of 50 slices shown]
[im 1/50]
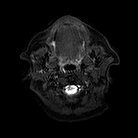
[im 25/50]
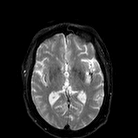
[im 50/50]
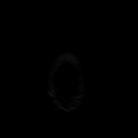

[Series 16: resolve dwi_adc · axial · 5.0mm · 1.67mm/px · 1 of 25 slices shown]
[im 1/25]
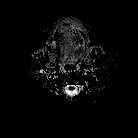

[Series 17: T2 post-contrast · coronal · 5.0mm · 0.57mm/px · 2 of 30 slices shown]
[im 1/30]
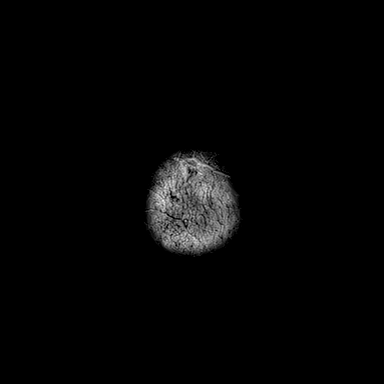
[im 30/30]
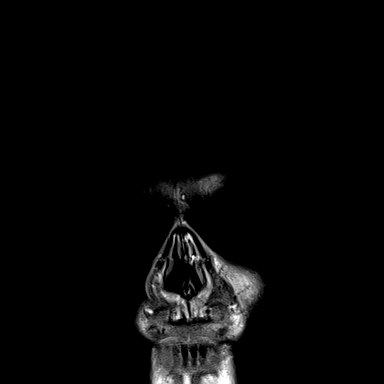

[Series 18: T1 post-contrast · axial · 1.0mm · 0.94mm/px · z∈[-90,+53]mm · 8 of 144 slices shown (1 of 3)]
[im 1/144]
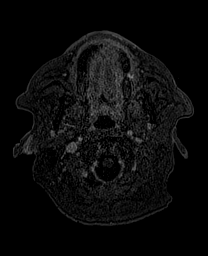
[im 21/144]
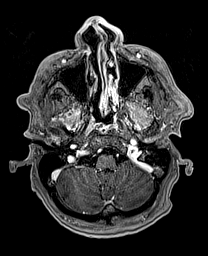
[im 41/144]
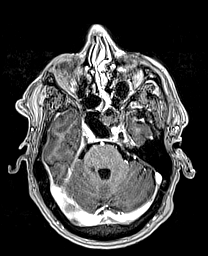
[im 62/144]
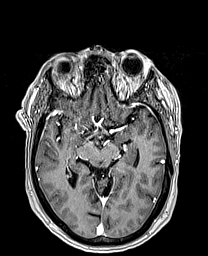
[im 82/144]
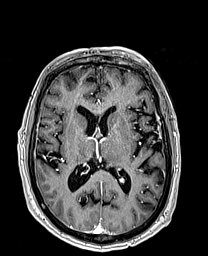
[im 103/144]
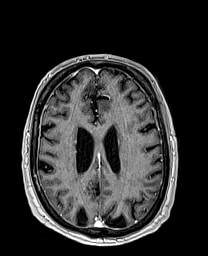
[im 123/144]
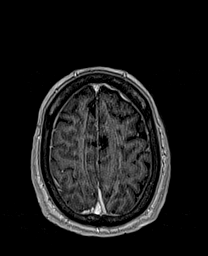
[im 144/144]
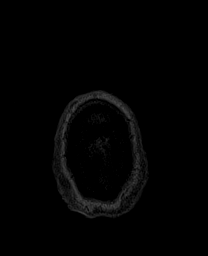

[Series 19: T1 · sagittal · 4.0mm · 0.94mm/px · 2 of 30 slices shown (3 of 4)]
[im 1/30]
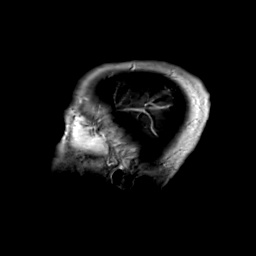
[im 30/30]
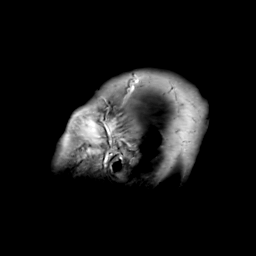

[Series 20: T1 · coronal · 4.0mm · 0.94mm/px · 2 of 30 slices shown (4 of 4)]
[im 1/30]
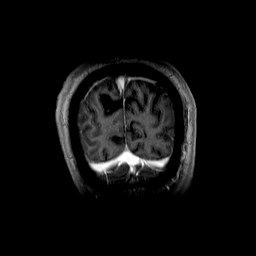
[im 30/30]
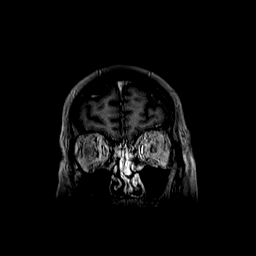

[Series 21: T1 post-contrast · coronal · 5.0mm · 0.43mm/px · 2 of 30 slices shown (2 of 3)]
[im 1/30]
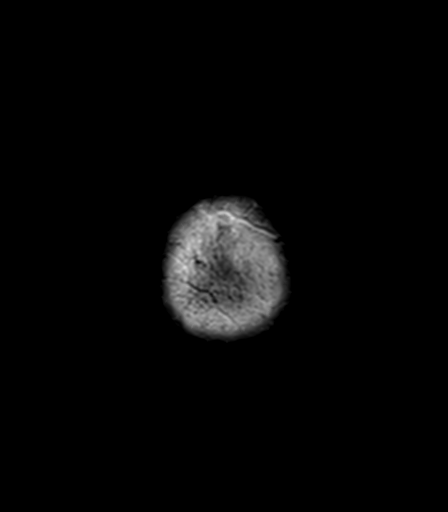
[im 30/30]
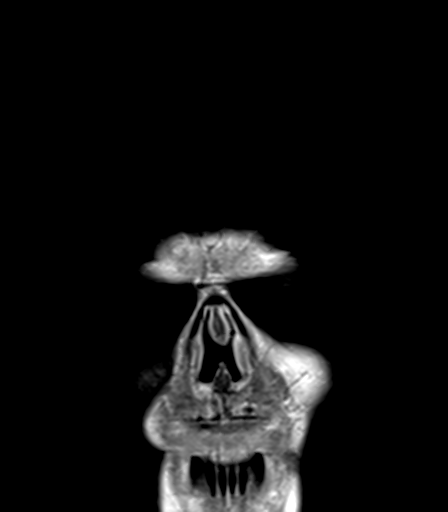

[Series 22: T1 post-contrast · sagittal · 5.0mm · 0.75mm/px · 1 of 24 slices shown (3 of 3)]
[im 1/24]
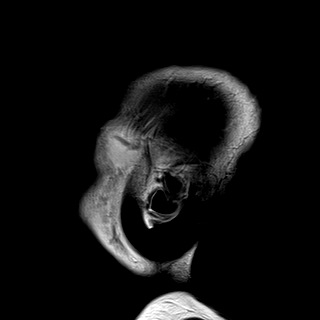

[48 of 48 positions shown; findings below may reference images not displayed]

FINDINGS: Brain: The study is mildly degraded by patient motion. No acute
infarct, hemorrhage, or mass lesion is present.

Postcontrast images demonstrate no pathologic enhancement.
Periventricular subcortical T2 hyperintensities are mildly advanced
for age. Atrophy is somewhat advanced for age is well. The
ventricles are proportionate to the degree of atrophy. No
significant extraaxial fluid collection is present.

The internal auditory canals are within normal limits.

A remote lacunar infarct is present in the left cerebellum.

Vascular: Flow is present in the major intracranial arteries.

Skull and upper cervical spine: The craniocervical junction is
normal. Upper cervical spine is within normal limits. Marrow signal
is unremarkable.

Sinuses/Orbits: Chronic opacification of the left sphenoid sinus is
present. Scattered mucosal thickening is present the ethmoid air
cells and inferior left frontal sinus. Fluid is present in the
mastoid air cells bilaterally. No obstructing nasopharyngeal lesion
present.

The globes and orbits are within normal limits.
IMPRESSION: 1. No evidence for metastatic disease to the brain or meninges.
2. Atrophy and white matter disease is mildly advanced for age. This
most likely reflects the sequela of chronic microvascular ischemia.
3. Chronic left sphenoid sinus disease.
4. Bilateral mastoid effusions. No obstructing nasopharyngeal lesion
is present.

## 2020-09-25 MED ORDER — GADOBUTROL 1 MMOL/ML IV SOLN
10.0000 mL | Freq: Once | INTRAVENOUS | Status: AC | PRN
  Administered 2020-09-25: 10 mL via INTRAVENOUS

## 2020-09-25 NOTE — Patient Instructions (Signed)
Full PFT performed today. °

## 2020-09-25 NOTE — Telephone Encounter (Signed)
Called pt's sister as a reminded for upcoming appt. No answer. Left msg with appt date and time.

## 2020-09-25 NOTE — Progress Notes (Signed)
Full PFT performed today. °

## 2020-09-28 ENCOUNTER — Telehealth: Payer: Self-pay | Admitting: Medical Oncology

## 2020-09-28 ENCOUNTER — Other Ambulatory Visit: Payer: PPO

## 2020-09-28 ENCOUNTER — Ambulatory Visit: Payer: PPO | Admitting: Physician Assistant

## 2020-09-28 NOTE — Telephone Encounter (Signed)
I told [name] [name] reason for appt. and for pt to bring his medication bottles.

## 2020-09-29 ENCOUNTER — Other Ambulatory Visit: Payer: Self-pay

## 2020-09-29 ENCOUNTER — Inpatient Hospital Stay: Payer: PPO

## 2020-09-29 ENCOUNTER — Encounter: Payer: Self-pay | Admitting: Emergency Medicine

## 2020-09-29 ENCOUNTER — Encounter: Payer: Self-pay | Admitting: *Deleted

## 2020-09-29 ENCOUNTER — Inpatient Hospital Stay: Payer: PPO | Attending: Physician Assistant | Admitting: Physician Assistant

## 2020-09-29 ENCOUNTER — Other Ambulatory Visit: Payer: Self-pay | Admitting: Internal Medicine

## 2020-09-29 ENCOUNTER — Telehealth: Payer: Self-pay | Admitting: *Deleted

## 2020-09-29 VITALS — BP 118/65 | HR 45 | Temp 98.8°F | Resp 17 | Ht 68.0 in | Wt 240.8 lb

## 2020-09-29 DIAGNOSIS — C3491 Malignant neoplasm of unspecified part of right bronchus or lung: Secondary | ICD-10-CM

## 2020-09-29 DIAGNOSIS — R918 Other nonspecific abnormal finding of lung field: Secondary | ICD-10-CM

## 2020-09-29 DIAGNOSIS — Z79899 Other long term (current) drug therapy: Secondary | ICD-10-CM | POA: Diagnosis not present

## 2020-09-29 DIAGNOSIS — C3431 Malignant neoplasm of lower lobe, right bronchus or lung: Secondary | ICD-10-CM | POA: Insufficient documentation

## 2020-09-29 DIAGNOSIS — Z7189 Other specified counseling: Secondary | ICD-10-CM | POA: Diagnosis not present

## 2020-09-29 LAB — CBC WITH DIFFERENTIAL (CANCER CENTER ONLY)
Abs Immature Granulocytes: 0.03 10*3/uL (ref 0.00–0.07)
Basophils Absolute: 0 10*3/uL (ref 0.0–0.1)
Basophils Relative: 0 %
Eosinophils Absolute: 0.6 10*3/uL — ABNORMAL HIGH (ref 0.0–0.5)
Eosinophils Relative: 6 %
HCT: 37.9 % — ABNORMAL LOW (ref 39.0–52.0)
Hemoglobin: 12.2 g/dL — ABNORMAL LOW (ref 13.0–17.0)
Immature Granulocytes: 0 %
Lymphocytes Relative: 15 %
Lymphs Abs: 1.6 10*3/uL (ref 0.7–4.0)
MCH: 30.9 pg (ref 26.0–34.0)
MCHC: 32.2 g/dL (ref 30.0–36.0)
MCV: 95.9 fL (ref 80.0–100.0)
Monocytes Absolute: 0.9 10*3/uL (ref 0.1–1.0)
Monocytes Relative: 8 %
Neutro Abs: 7.2 10*3/uL (ref 1.7–7.7)
Neutrophils Relative %: 71 %
Platelet Count: 287 10*3/uL (ref 150–400)
RBC: 3.95 MIL/uL — ABNORMAL LOW (ref 4.22–5.81)
RDW: 13.6 % (ref 11.5–15.5)
WBC Count: 10.3 10*3/uL (ref 4.0–10.5)
nRBC: 0 % (ref 0.0–0.2)

## 2020-09-29 LAB — CMP (CANCER CENTER ONLY)
ALT: 21 U/L (ref 0–44)
AST: 13 U/L — ABNORMAL LOW (ref 15–41)
Albumin: 3.3 g/dL — ABNORMAL LOW (ref 3.5–5.0)
Alkaline Phosphatase: 72 U/L (ref 38–126)
Anion gap: 7 (ref 5–15)
BUN: 9 mg/dL (ref 8–23)
CO2: 28 mmol/L (ref 22–32)
Calcium: 9 mg/dL (ref 8.9–10.3)
Chloride: 107 mmol/L (ref 98–111)
Creatinine: 0.99 mg/dL (ref 0.61–1.24)
GFR, Estimated: >60 mL/min (ref >60)
Glucose, Bld: 93 mg/dL (ref 70–99)
Potassium: 4.4 mmol/L (ref 3.5–5.1)
Sodium: 142 mmol/L (ref 135–145)
Total Bilirubin: 0.6 mg/dL (ref 0.3–1.2)
Total Protein: 6.7 g/dL (ref 6.5–8.1)

## 2020-09-29 LAB — TSH: TSH: 1.318 u[IU]/mL (ref 0.320–4.118)

## 2020-09-29 MED ORDER — PROCHLORPERAZINE MALEATE 10 MG PO TABS: 10.0000 mg | ORAL_TABLET | Freq: Four times a day (QID) | ORAL | 2 refills | Status: DC | PRN

## 2020-09-29 NOTE — Research (Signed)
[id number] - Customer service manager for the Discovery and Validation of Biomarkers for the Prediction, Diagnosis, and Management of Disease 09/29/20 Patient [name] [name] was identified by this research coordinator as a potential candidate for the above listed study.  This Clinical Research Coordinator met with [name] [name], [id number], on 09/29/20 in a manner and location that ensures patient privacy to discuss participation in the above listed research study.    Patient declined participation in this research study.  The patient was thanked for his time.  Clabe Seal Clinical Research Coordinator I  09/29/20 2:04 PM

## 2020-09-29 NOTE — Progress Notes (Signed)
Oncology Nurse Navigator Documentation  Oncology Nurse Navigator Flowsheets 09/29/2020  Abnormal Finding Date 08/19/2020  Confirmed Diagnosis Date 09/18/2020  Diagnosis Status Pending Molecular Studies  Navigator Follow Up Date: 10/01/2020  Navigator Follow Up Reason: Appointment Review  Navigator Location [facility]  Navigator Encounter Type Clinic/MDC;Initial MedOnc  Patient Visit Type Initial;MedOnc  Treatment Phase Pre-Tx/Tx Discussion  Barriers/Navigation Needs Education;Coordination of Care  Education Newly Diagnosed Cancer Education;Concerns with Finances/ Eligibility;Understanding Cancer/ Treatment Options;Other  Interventions Psycho-Social Support;Education;Coordination of Care  Acuity Level 3-Moderate Needs (3-4 Barriers Identified)  Coordination of Care Appts;Other  Education Method Verbal;Written  Time Spent with Patient 60

## 2020-09-29 NOTE — Progress Notes (Signed)
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 

## 2020-09-29 NOTE — Patient Instructions (Signed)
-  There are two main categories of lung cancer, they are named based on the size of the cancer cell. One is called Non-Small cell lung cancer. 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  -We covered a lot of important information at your appointment today regarding what the treatment plan is moving forward. Here are the the main points that were discussed at your office visit with [place] today:  -The treatment that you will receive consists of two chemotherapy drugs, called Carboplatin and Paclitaxel (also referred to as Taxol) and one immunotherapy drug called Keytruda (pembrolizumab).  -We are planning on starting your treatment next week on 10/06/20 but before your start your treatment, I would like you to attend a Chemotherapy Education Class. This involves having you sit down with one of our nurse educators. She will discuss with you one-on-one more details about your treatment as well as general information about resources here at the cancer center.  -Your treatment will be given once every 3 weeks. We will check your labs once a week for the first ~5 treatments just to make sure that important components of your blood are in an acceptable range -You will need to return 2 days after your treatment to receive an injection. This injection is important because it boosts your infection fighting cells in your body and helps protect you from getting an infection.  -We will get a CT scan after 3 treatments to check on the progress of treatment  Medications:  -Compazine was sent to your pharmacy. This medication is for nausea. You may take this every 6 hours as needed if you feel nausous.   Referrals or Imaging:   Follow up:  -We will see you back for a follow up visit in about  2 weeks to see how your first treatment went and to make sure you are not having any side  effects from treatment.   If you need to contact our office, please do not hesitate, we are here to help. Our number is [phone]. When you call, ask to speak to [name]'s or Dr. Worthy Flank nurse.

## 2020-09-30 ENCOUNTER — Encounter: Payer: Self-pay | Admitting: *Deleted

## 2020-09-30 ENCOUNTER — Ambulatory Visit: Payer: PPO | Admitting: Pulmonary Disease

## 2020-09-30 VITALS — BP 128/68 | HR 73 | Ht 68.0 in | Wt 242.6 lb

## 2020-09-30 DIAGNOSIS — R0600 Dyspnea, unspecified: Secondary | ICD-10-CM | POA: Diagnosis not present

## 2020-09-30 DIAGNOSIS — R918 Other nonspecific abnormal finding of lung field: Secondary | ICD-10-CM | POA: Diagnosis not present

## 2020-09-30 DIAGNOSIS — R0609 Other forms of dyspnea: Secondary | ICD-10-CM

## 2020-09-30 DIAGNOSIS — C3491 Malignant neoplasm of unspecified part of right bronchus or lung: Secondary | ICD-10-CM

## 2020-09-30 DIAGNOSIS — R042 Hemoptysis: Secondary | ICD-10-CM

## 2020-09-30 NOTE — Patient Instructions (Signed)
Thank you for visiting Dr. Valeta Harms at [facility]. Today we recommend the following:  Call me if your symptoms change or get worse.   Return in about 6 months (around 04/02/2021) for with APP or Dr. Valeta Harms.    Please do your part to reduce the spread of COVID-19.
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
## 2020-10-06 ENCOUNTER — Telehealth: Payer: Self-pay | Admitting: Internal Medicine

## 2020-10-06 NOTE — Telephone Encounter (Signed)
Called and spoke with patients sister and confirmed all appts. Gave number for transportation.

## 2020-10-07 ENCOUNTER — Telehealth: Payer: Self-pay | Admitting: Internal Medicine

## 2020-10-07 ENCOUNTER — Inpatient Hospital Stay: Payer: PPO | Attending: Physician Assistant

## 2020-10-07 ENCOUNTER — Other Ambulatory Visit: Payer: Self-pay

## 2020-10-07 DIAGNOSIS — C3431 Malignant neoplasm of lower lobe, right bronchus or lung: Secondary | ICD-10-CM | POA: Insufficient documentation

## 2020-10-07 DIAGNOSIS — C7972 Secondary malignant neoplasm of left adrenal gland: Secondary | ICD-10-CM | POA: Insufficient documentation

## 2020-10-07 DIAGNOSIS — Z5112 Encounter for antineoplastic immunotherapy: Secondary | ICD-10-CM | POA: Insufficient documentation

## 2020-10-07 DIAGNOSIS — Z5111 Encounter for antineoplastic chemotherapy: Secondary | ICD-10-CM | POA: Insufficient documentation

## 2020-10-07 DIAGNOSIS — Z79899 Other long term (current) drug therapy: Secondary | ICD-10-CM | POA: Insufficient documentation

## 2020-10-07 NOTE — Telephone Encounter (Signed)
   [name] [name] DOB: 01-Dec-1955 MRN: [id number]   [name] [name]  For purposes of improving physical access to our facilities, [facility] is pleased to partner with third parties to provide [name] [name] patients or other authorized individuals the option of convenient, on-demand ground transportation services (the [employer]") through use of the technology service that enables users to request on-demand ground transportation from independent third-party providers.  By opting to use and accept these [name] [name], I, the undersigned, hereby agree on behalf of myself, and on behalf of any minor child using the [name] [name] for whom I am the parent or legal guardian, as follows:  [name] [name] provided to me are provided by independent third-party transportation providers who are not [name] [name] or employees and who are unaffiliated with [name] [name]. [name] [name] is neither a transportation carrier nor a common or public carrier. [name] [name] has no control over the quality or safety of the transportation that occurs as a result of the [name] [name]. [name] [name] cannot guarantee that any third-party transportation provider will complete any arranged transportation service. [name] [name] makes no representation, warranty, or guarantee regarding the reliability, timeliness, quality, safety, suitability, or availability of any of the Transport Services or that they will be error free. I fully understand that traveling by vehicle involves risks and dangers of serious bodily injury, including permanent disability, paralysis, and death. I agree, on behalf of myself and on behalf of any minor child using the Transport Services for whom I am the parent or legal guardian, that the entire risk arising out of my use of the [name] [name] remains solely with me, to the maximum extent permitted under applicable law. The [name] [name] are provided "as  is" and "as available." [name] [name] disclaims all representations and warranties, express, implied or statutory, not expressly set out in these terms, including the implied warranties of merchantability and fitness for a particular purpose. I hereby waive and release [name] [name], its agents, employees, officers, directors, representatives, insurers, attorneys, assigns, successors, subsidiaries, and affiliates from any and all past, present, or future claims, demands, liabilities, actions, causes of action, or suits of any kind directly or indirectly arising from acceptance and use of the [name] [name]. I further waive and release [name] [name] and its affiliates from all present and future [name] and responsibility for any injury or death to persons or damages to property caused by or related to the use of the [name] [name]. I have read this Waiver and Release of [name], and I understand the terms used in it and their legal significance. This Waiver is freely and voluntarily given with the understanding that my right (as well as the right of any minor child for whom I am the parent or legal guardian using the [name] [name]) to legal recourse against [name] [name] in connection with the [name] [name] is knowingly surrendered in return for use of these services.   I attest that I read the consent document to [name] [name], gave [name] [name] the opportunity to ask questions and answered the questions asked (if any). I affirm that [name] [name] then provided consent for he's participation in this program.     [name] [name]

## 2020-10-08 ENCOUNTER — Inpatient Hospital Stay: Payer: PPO

## 2020-10-08 VITALS — BP 131/76 | HR 82 | Temp 97.6°F | Resp 17

## 2020-10-08 DIAGNOSIS — Z5111 Encounter for antineoplastic chemotherapy: Secondary | ICD-10-CM | POA: Diagnosis not present

## 2020-10-08 DIAGNOSIS — Z79899 Other long term (current) drug therapy: Secondary | ICD-10-CM | POA: Diagnosis not present

## 2020-10-08 DIAGNOSIS — C3491 Malignant neoplasm of unspecified part of right bronchus or lung: Secondary | ICD-10-CM

## 2020-10-08 DIAGNOSIS — C3431 Malignant neoplasm of lower lobe, right bronchus or lung: Secondary | ICD-10-CM | POA: Diagnosis not present

## 2020-10-08 DIAGNOSIS — C7972 Secondary malignant neoplasm of left adrenal gland: Secondary | ICD-10-CM | POA: Diagnosis not present

## 2020-10-08 DIAGNOSIS — Z5112 Encounter for antineoplastic immunotherapy: Secondary | ICD-10-CM | POA: Diagnosis not present

## 2020-10-08 LAB — CBC WITH DIFFERENTIAL (CANCER CENTER ONLY)
Abs Immature Granulocytes: 0.03 10*3/uL (ref 0.00–0.07)
Basophils Absolute: 0 10*3/uL (ref 0.0–0.1)
Basophils Relative: 0 %
Eosinophils Absolute: 0.9 10*3/uL — ABNORMAL HIGH (ref 0.0–0.5)
Eosinophils Relative: 9 %
HCT: 38.3 % — ABNORMAL LOW (ref 39.0–52.0)
Hemoglobin: 12.4 g/dL — ABNORMAL LOW (ref 13.0–17.0)
Immature Granulocytes: 0 %
Lymphocytes Relative: 12 %
Lymphs Abs: 1.2 10*3/uL (ref 0.7–4.0)
MCH: 30.8 pg (ref 26.0–34.0)
MCHC: 32.4 g/dL (ref 30.0–36.0)
MCV: 95 fL (ref 80.0–100.0)
Monocytes Absolute: 1 10*3/uL (ref 0.1–1.0)
Monocytes Relative: 10 %
Neutro Abs: 6.9 10*3/uL (ref 1.7–7.7)
Neutrophils Relative %: 69 %
Platelet Count: 282 10*3/uL (ref 150–400)
RBC: 4.03 MIL/uL — ABNORMAL LOW (ref 4.22–5.81)
RDW: 13 % (ref 11.5–15.5)
WBC Count: 10 10*3/uL (ref 4.0–10.5)
nRBC: 0 % (ref 0.0–0.2)

## 2020-10-08 LAB — CMP (CANCER CENTER ONLY)
ALT: 10 U/L (ref 0–44)
AST: 11 U/L — ABNORMAL LOW (ref 15–41)
Albumin: 4.1 g/dL (ref 3.5–5.0)
Alkaline Phosphatase: 82 U/L (ref 38–126)
Anion gap: 9 (ref 5–15)
BUN: 9 mg/dL (ref 8–23)
CO2: 29 mmol/L (ref 22–32)
Calcium: 9.4 mg/dL (ref 8.9–10.3)
Chloride: 103 mmol/L (ref 98–111)
Creatinine: 1.09 mg/dL (ref 0.61–1.24)
GFR, Estimated: >60 mL/min (ref >60)
Glucose, Bld: 102 mg/dL — ABNORMAL HIGH (ref 70–99)
Potassium: 3.7 mmol/L (ref 3.5–5.1)
Sodium: 141 mmol/L (ref 135–145)
Total Bilirubin: 0.6 mg/dL (ref 0.3–1.2)
Total Protein: 7.1 g/dL (ref 6.5–8.1)

## 2020-10-08 LAB — TSH: TSH: 1.521 u[IU]/mL (ref 0.320–4.118)

## 2020-10-08 MED ORDER — SODIUM CHLORIDE 0.9 % IV SOLN
175.0000 mg/m2 | Freq: Once | INTRAVENOUS | Status: AC
  Administered 2020-10-08: 402 mg via INTRAVENOUS
  Filled 2020-10-08: qty 67

## 2020-10-08 MED ORDER — PALONOSETRON HCL INJECTION 0.25 MG/5ML
0.2500 mg | Freq: Once | INTRAVENOUS | Status: AC
  Administered 2020-10-08: 0.25 mg via INTRAVENOUS

## 2020-10-08 MED ORDER — SODIUM CHLORIDE 0.9 % IV SOLN
150.0000 mg | Freq: Once | INTRAVENOUS | Status: AC
  Administered 2020-10-08: 150 mg via INTRAVENOUS
  Filled 2020-10-08: qty 150

## 2020-10-08 MED ORDER — SODIUM CHLORIDE 0.9 % IV SOLN
707.0000 mg | Freq: Once | INTRAVENOUS | Status: AC
  Administered 2020-10-08: 710 mg via INTRAVENOUS
  Filled 2020-10-08: qty 71

## 2020-10-08 MED ORDER — SODIUM CHLORIDE 0.9 % IV SOLN
200.0000 mg | Freq: Once | INTRAVENOUS | Status: AC
  Administered 2020-10-08: 200 mg via INTRAVENOUS
  Filled 2020-10-08: qty 8

## 2020-10-08 MED ORDER — FAMOTIDINE IN NACL 20-0.9 MG/50ML-% IV SOLN
20.0000 mg | Freq: Once | INTRAVENOUS | Status: AC
  Administered 2020-10-08: 20 mg via INTRAVENOUS

## 2020-10-08 MED ORDER — PALONOSETRON HCL INJECTION 0.25 MG/5ML
INTRAVENOUS | Status: AC
  Filled 2020-10-08: qty 5

## 2020-10-08 MED ORDER — SODIUM CHLORIDE 0.9 % IV SOLN
Freq: Once | INTRAVENOUS | Status: AC
Start: 2020-10-08 — End: 2020-10-08
  Filled 2020-10-08: qty 250

## 2020-10-08 MED ORDER — SODIUM CHLORIDE 0.9 % IV SOLN
10.0000 mg | Freq: Once | INTRAVENOUS | Status: AC
  Administered 2020-10-08: 10 mg via INTRAVENOUS
  Filled 2020-10-08: qty 10

## 2020-10-08 MED ORDER — DIPHENHYDRAMINE HCL 50 MG/ML IJ SOLN
INTRAMUSCULAR | Status: AC
  Filled 2020-10-08: qty 1

## 2020-10-08 MED ORDER — DIPHENHYDRAMINE HCL 50 MG/ML IJ SOLN
50.0000 mg | Freq: Once | INTRAMUSCULAR | Status: AC
  Administered 2020-10-08: 50 mg via INTRAVENOUS

## 2020-10-08 NOTE — Progress Notes (Signed)
1135, Pt hypotensive, reviewed with MD. VO for IVF . Began Taxol, VS taken q 5 minutes x 15 minutes.Continued running IVF for a total of 500cc.  Pt tolerated treatment without incident. VSS.

## 2020-10-08 NOTE — Patient Instructions (Signed)
Carboplatin injection What is this medication? CARBOPLATIN (KAR boe pla tin) is a chemotherapy drug. It targets fast dividing cells, like cancer cells, and causes these cells to die. This medicine is usedto treat ovarian cancer and many other cancers. This medicine may be used for other purposes; ask your health care provider orpharmacist if you have questions. COMMON BRAND NAME(S): Paraplatin What should I tell my care team before I take this medication? They need to know if you have any of these conditions: blood disorders hearing problems kidney disease recent or ongoing radiation therapy an unusual or allergic reaction to carboplatin, cisplatin, other chemotherapy, other medicines, foods, dyes, or preservatives pregnant or trying to get pregnant breast-feeding How should I use this medication? This drug is usually given as an infusion into a vein. It is administered in [place] or clinic by a specially trained health care professional. Talk to your pediatrician regarding the use of this medicine in children.Special care may be needed. Overdosage: If you think you have taken too much of this medicine contact [facility] control center or emergency room at once. NOTE: This medicine is only for you. Do not share this medicine with others. What if I miss a dose? It is important not to miss a dose. Call your doctor or health careprofessional if you are unable to keep an appointment. What may interact with this medication? medicines for seizures medicines to increase blood counts like filgrastim, pegfilgrastim, sargramostim some antibiotics like amikacin, gentamicin, neomycin, streptomycin, tobramycin vaccines Talk to your doctor or health care professional before taking any of thesemedicines: acetaminophen aspirin ibuprofen ketoprofen naproxen This list may not describe all possible interactions. Give your health care provider a list of all the medicines, herbs, non-prescription drugs, or  dietary supplements you use. Also tell them if you smoke, drink alcohol, or use illegaldrugs. Some items may interact with your medicine. What should I watch for while using this medication? Your condition will be monitored carefully while you are receiving this medicine. You will need important blood work done while you are taking thismedicine. This drug may make you feel generally unwell. This is not uncommon, as chemotherapy can affect healthy cells as well as cancer cells. Report any side effects. Continue your course of treatment even though you feel ill unless yourdoctor tells you to stop. In some cases, you may be given additional medicines to help with side effects.Follow all directions for their use. Call your doctor or health care professional for advice if you get a fever, chills or sore throat, or other symptoms of a cold or flu. Do not treat yourself. This drug decreases your body's ability to fight infections. Try toavoid being around people who are sick. This medicine may increase your risk to bruise or bleed. Call your doctor orhealth care professional if you notice any unusual bleeding. Be careful brushing and flossing your teeth or using a toothpick because you may get an infection or bleed more easily. If you have any dental work done,tell your dentist you are receiving this medicine. Avoid taking products that contain aspirin, acetaminophen, ibuprofen, naproxen, or ketoprofen unless instructed by your doctor. These medicines may hide afever. Do not become pregnant while taking this medicine. Women should inform their doctor if they wish to become pregnant or think they might be pregnant. There is a potential for serious side effects to an unborn child. Talk to your health care professional or pharmacist for more information. Do not breast-feed aninfant while taking this medicine. What side effects may  I notice from receiving this medication? Side effects that you should report to your  doctor or health care professionalas soon as possible: allergic reactions like skin rash, itching or hives, swelling of the face, lips, or tongue signs of infection - fever or chills, cough, sore throat, pain or difficulty passing urine signs of decreased platelets or bleeding - bruising, pinpoint red spots on the skin, black, tarry stools, nosebleeds signs of decreased red blood cells - unusually weak or tired, fainting spells, lightheadedness breathing problems changes in hearing changes in vision chest pain high blood pressure low blood counts - This drug may decrease the number of white blood cells, red blood cells and platelets. You may be at increased risk for infections and bleeding. nausea and vomiting pain, swelling, redness or irritation at the injection site pain, tingling, numbness in the hands or feet problems with balance, talking, walking trouble passing urine or change in the amount of urine Side effects that usually do not require medical attention (report to yourdoctor or health care professional if they continue or are bothersome): hair loss loss of appetite metallic taste in the mouth or changes in taste This list may not describe all possible side effects. Call your doctor for medical advice about side effects. You may report side effects to FDA at1-800-FDA-1088. Where should I keep my medication? This drug is given in a hospital or clinic and will not be stored at home. NOTE: This sheet is a summary. It may not cover all possible information. If you have questions about this medicine, talk to your doctor, pharmacist, orhealth care provider.  2022 Elsevier/Gold Standard (2007-05-29 14:38:05) Paclitaxel injection What is this medication? PACLITAXEL (PAK li TAX el) is a chemotherapy drug. It targets fast dividing cells, like cancer cells, and causes these cells to die. This medicine is used to treat ovarian cancer, breast cancer, lung cancer, Kaposi's sarcoma, andother  cancers. This medicine may be used for other purposes; ask your health care provider orpharmacist if you have questions. COMMON BRAND NAME(S): Onxol, Taxol What should I tell my care team before I take this medication? They need to know if you have any of these conditions: history of irregular heartbeat liver disease low blood counts, like low white cell, platelet, or red cell counts lung or breathing disease, like asthma tingling of the fingers or toes, or other nerve disorder an unusual or allergic reaction to paclitaxel, alcohol, polyoxyethylated castor oil, other chemotherapy, other medicines, foods, dyes, or preservatives pregnant or trying to get pregnant breast-feeding How should I use this medication? This drug is given as an infusion into a vein. It is administered in a hospitalor clinic by a specially trained health care professional. Talk to your pediatrician regarding the use of this medicine in children.Special care may be needed. Overdosage: If you think you have taken too much of this medicine contact apoison control center or emergency room at once. NOTE: This medicine is only for you. Do not share this medicine with others. What if I miss a dose? It is important not to miss your dose. Call your doctor or health careprofessional if you are unable to keep an appointment. What may interact with this medication? Do not take this medicine with any of the following medications: live virus vaccines This medicine may also interact with the following medications: antiviral medicines for hepatitis, HIV or AIDS certain antibiotics like erythromycin and clarithromycin certain medicines for fungal infections like ketoconazole and itraconazole certain medicines for seizures like carbamazepine, phenobarbital, phenytoin  gemfibrozil nefazodone rifampin St. John's wort This list may not describe all possible interactions. Give your health care provider a list of all the medicines,  herbs, non-prescription drugs, or dietary supplements you use. Also tell them if you smoke, drink alcohol, or use illegaldrugs. Some items may interact with your medicine. What should I watch for while using this medication? Your condition will be monitored carefully while you are receiving this medicine. You will need important blood work done while you are taking thismedicine. This medicine can cause serious allergic reactions. To reduce your risk you will need to take other medicine(s) before treatment with this medicine. If you experience allergic reactions like skin rash, itching or hives, swelling of theface, lips, or tongue, tell your doctor or health care professional right away. In some cases, you may be given additional medicines to help with side effects.Follow all directions for their use. This drug may make you feel generally unwell. This is not uncommon, as chemotherapy can affect healthy cells as well as cancer cells. Report any side effects. Continue your course of treatment even though you feel ill unless yourdoctor tells you to stop. Call your doctor or health care professional for advice if you get a fever, chills or sore throat, or other symptoms of a cold or flu. Do not treat yourself. This drug decreases your body's ability to fight infections. Try toavoid being around people who are sick. This medicine may increase your risk to bruise or bleed. Call your doctor orhealth care professional if you notice any unusual bleeding. Be careful brushing and flossing your teeth or using a toothpick because you may get an infection or bleed more easily. If you have any dental work done,tell your dentist you are receiving this medicine. Avoid taking products that contain aspirin, acetaminophen, ibuprofen, naproxen, or ketoprofen unless instructed by your doctor. These medicines may hide afever. Do not become pregnant while taking this medicine. Women should inform their doctor if they wish to  become pregnant or think they might be pregnant. There is a potential for serious side effects to an unborn child. Talk to your health care professional or pharmacist for more information. Do not breast-feed aninfant while taking this medicine. Men are advised not to father a child while receiving this medicine. This product may contain alcohol. Ask your pharmacist or healthcare provider if this medicine contains alcohol. Be sure to tell all healthcare providers you are taking this medicine. Certain medicines, like metronidazole and disulfiram, can cause an unpleasant reaction when taken with alcohol. The reaction includes flushing, headache, nausea, vomiting, sweating, and increased thirst. Thereaction can last from 30 minutes to several hours. What side effects may I notice from receiving this medication? Side effects that you should report to your doctor or health care professionalas soon as possible: allergic reactions like skin rash, itching or hives, swelling of the face, lips, or tongue breathing problems changes in vision fast, irregular heartbeat high or low blood pressure mouth sores pain, tingling, numbness in the hands or feet signs of decreased platelets or bleeding - bruising, pinpoint red spots on the skin, black, tarry stools, blood in the urine signs of decreased red blood cells - unusually weak or tired, feeling faint or lightheaded, falls signs of infection - fever or chills, cough, sore throat, pain or difficulty passing urine signs and symptoms of liver injury like dark yellow or brown urine; general ill feeling or flu-like symptoms; light-colored stools; loss of appetite; nausea; right upper belly pain; unusually weak or  tired; yellowing of the eyes or skin swelling of the ankles, feet, hands unusually slow heartbeat Side effects that usually do not require medical attention (report to yourdoctor or health care professional if they continue or are bothersome): diarrhea hair  loss loss of appetite muscle or joint pain nausea, vomiting pain, redness, or irritation at site where injected tiredness This list may not describe all possible side effects. Call your doctor for medical advice about side effects. You may report side effects to FDA at1-800-FDA-1088. Where should I keep my medication? This drug is given in a hospital or clinic and will not be stored at home. NOTE: This sheet is a summary. It may not cover all possible information. If you have questions about this medicine, talk to your doctor, pharmacist, orhealth care provider.  2022 [employer]vier/Gold Standard (2019-01-23 13:37:23) Pembrolizumab injection What is this medication? PEMBROLIZUMAB (pem broe liz ue mab) is a monoclonal antibody. It is used totreat certain types of cancer. This medicine may be used for other purposes; ask your health care provider orpharmacist if you have questions. COMMON BRAND NAME(S): Keytruda What should I tell my care team before I take this medication? They need to know if you have any of these conditions: autoimmune diseases like Crohn's disease, ulcerative colitis, or lupus have had or planning to have an allogeneic stem cell transplant (uses someone else's stem cells) history of organ transplant history of chest radiation nervous system problems like myasthenia gravis or Guillain-Barre syndrome an unusual or allergic reaction to pembrolizumab, other medicines, foods, dyes, or preservatives pregnant or trying to get pregnant breast-feeding How should I use this medication? This medicine is for infusion into a vein. It is given by a health careprofessional in a hospital or clinic setting. A special MedGuide will be given to you before each treatment. Be sure to readthis information carefully each time. Talk to your pediatrician regarding the use of this medicine in children. While this drug may be prescribed for children as young as 6 months for selectedconditions,  precautions do apply. Overdosage: If you think you have taken too much of this medicine contact apoison control center or emergency room at once. NOTE: This medicine is only for you. Do not share this medicine with others. What if I miss a dose? It is important not to miss your dose. Call your doctor or health careprofessional if you are unable to keep an appointment. What may interact with this medication? Interactions have not been studied. This list may not describe all possible interactions. Give your health care provider a list of all the medicines, herbs, non-prescription drugs, or dietary supplements you use. Also tell them if you smoke, drink alcohol, or use illegaldrugs. Some items may interact with your medicine. What should I watch for while using this medication? Your condition will be monitored carefully while you are receiving thismedicine. You may need blood work done while you are taking this medicine. Do not become pregnant while taking this medicine or for 4 months after stopping it. Women should inform their doctor if they wish to become pregnant or think they might be pregnant. There is a potential for serious side effects to an unborn child. Talk to your health care professional or pharmacist for more information. Do not breast-feed an infant while taking this medicine orfor 4 months after the last dose. What side effects may I notice from receiving this medication? Side effects that you should report to your doctor or health care professionalas soon as possible: allergic reactions  like skin rash, itching or hives, swelling of the face, lips, or tongue bloody or black, tarry breathing problems changes in vision chest pain chills confusion constipation cough diarrhea dizziness or feeling faint or lightheaded fast or irregular heartbeat fever flushing joint pain low blood counts - this medicine may decrease the number of white blood cells, red blood cells and  platelets. You may be at increased risk for infections and bleeding. muscle pain muscle weakness pain, tingling, numbness in the hands or feet persistent headache redness, blistering, peeling or loosening of the skin, including inside the mouth signs and symptoms of high blood sugar such as dizziness; dry mouth; dry skin; fruity breath; nausea; stomach pain; increased hunger or thirst; increased urination signs and symptoms of kidney injury like trouble passing urine or change in the amount of urine signs and symptoms of liver injury like dark urine, light-colored stools, loss of appetite, nausea, right upper belly pain, yellowing of the eyes or skin sweating swollen lymph nodes weight loss Side effects that usually do not require medical attention (report to yourdoctor or health care professional if they continue or are bothersome): decreased appetite hair loss tiredness This list may not describe all possible side effects. Call your doctor for medical advice about side effects. You may report side effects to FDA at1-800-FDA-1088. Where should I keep my medication? This drug is given in a hospital or clinic and will not be stored at home. NOTE: This sheet is a summary. It may not cover all possible information. If you have questions about this medicine, talk to your doctor, pharmacist, orhealth care provider.  2022 [employer]/Gold Standard (2019-01-23 21:44:53)

## 2020-10-09 ENCOUNTER — Telehealth: Payer: Self-pay | Admitting: Cardiology

## 2020-10-09 ENCOUNTER — Telehealth: Payer: Self-pay

## 2020-10-09 NOTE — Telephone Encounter (Signed)
  Per answering service message:  Patient has cancer and was unable to get his chemo treatment because his BP was too low. Patient wants doctor opinion on stopping metoprolol tartrate (LOPRESSOR) 25 MG tablet.

## 2020-10-09 NOTE — Telephone Encounter (Signed)
Lets actually have him d/c Diltiazem & continue Metoprolol going forward.  Glenetta Hew, MD
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
## 2020-10-09 NOTE — Telephone Encounter (Signed)
Spoke to patient he stated he started chemo yesterday.Stated they could not finish chemo due to low B/P.This morning B/P 88/86 pulse 84.Advised to hold am dose of Metoprolol.He takes Diltiazem at night.I will send message to [place] for advice.Stated he wanted to thank Dr.Harding for finding his cancer.

## 2020-10-09 NOTE — Telephone Encounter (Signed)
Spoke to [facility] about patient's low B/P.Dr.Kelly advised hold pm dose of Metoprolol.Advised to take Metoprolol 25 mg 1/2 tablet if systolic B/P greater than 100 over the weekend.Advised to hold Metoprolol if systolic less than 829.Advised message sent to [place].

## 2020-10-10 ENCOUNTER — Other Ambulatory Visit: Payer: Self-pay

## 2020-10-10 ENCOUNTER — Inpatient Hospital Stay: Payer: PPO

## 2020-10-10 VITALS — BP 122/76 | HR 68 | Temp 97.3°F | Resp 16

## 2020-10-10 DIAGNOSIS — Z5112 Encounter for antineoplastic immunotherapy: Secondary | ICD-10-CM | POA: Diagnosis not present

## 2020-10-10 DIAGNOSIS — C3491 Malignant neoplasm of unspecified part of right bronchus or lung: Secondary | ICD-10-CM

## 2020-10-10 MED ORDER — PEGFILGRASTIM-CBQV 6 MG/0.6ML ~~LOC~~ SOSY
6.0000 mg | PREFILLED_SYRINGE | Freq: Once | SUBCUTANEOUS | Status: AC
  Administered 2020-10-10: 6 mg via SUBCUTANEOUS

## 2020-10-10 MED ORDER — PEGFILGRASTIM-CBQV 6 MG/0.6ML ~~LOC~~ SOSY
PREFILLED_SYRINGE | SUBCUTANEOUS | Status: AC
  Filled 2020-10-10: qty 0.6

## 2020-10-10 NOTE — Patient Instructions (Signed)

## 2020-10-12 NOTE — Telephone Encounter (Signed)
Spoke to patient Dr.Harding advised to stop Diltiazem and continue Metoprolol.Advised continue to monitor B/P and call back if continues to be low.

## 2020-10-13 ENCOUNTER — Inpatient Hospital Stay: Payer: PPO | Admitting: Physician Assistant

## 2020-10-13 ENCOUNTER — Other Ambulatory Visit: Payer: Self-pay

## 2020-10-13 ENCOUNTER — Inpatient Hospital Stay: Payer: PPO

## 2020-10-13 VITALS — BP 119/77 | HR 82 | Temp 98.4°F | Resp 20 | Ht 68.0 in | Wt 242.2 lb

## 2020-10-13 DIAGNOSIS — C3491 Malignant neoplasm of unspecified part of right bronchus or lung: Secondary | ICD-10-CM | POA: Diagnosis not present

## 2020-10-13 DIAGNOSIS — C3492 Malignant neoplasm of unspecified part of left bronchus or lung: Secondary | ICD-10-CM | POA: Diagnosis not present

## 2020-10-13 DIAGNOSIS — Z5112 Encounter for antineoplastic immunotherapy: Secondary | ICD-10-CM | POA: Diagnosis not present

## 2020-10-13 LAB — CBC WITH DIFFERENTIAL (CANCER CENTER ONLY)
Abs Immature Granulocytes: 0.06 10*3/uL (ref 0.00–0.07)
Basophils Absolute: 0 10*3/uL (ref 0.0–0.1)
Basophils Relative: 1 %
Eosinophils Absolute: 0.6 10*3/uL — ABNORMAL HIGH (ref 0.0–0.5)
Eosinophils Relative: 10 %
HCT: 35.7 % — ABNORMAL LOW (ref 39.0–52.0)
Hemoglobin: 11.8 g/dL — ABNORMAL LOW (ref 13.0–17.0)
Immature Granulocytes: 1 %
Lymphocytes Relative: 13 %
Lymphs Abs: 0.8 10*3/uL (ref 0.7–4.0)
MCH: 30.4 pg (ref 26.0–34.0)
MCHC: 33.1 g/dL (ref 30.0–36.0)
MCV: 92 fL (ref 80.0–100.0)
Monocytes Absolute: 0.1 10*3/uL (ref 0.1–1.0)
Monocytes Relative: 1 %
Neutro Abs: 4.1 10*3/uL (ref 1.7–7.7)
Neutrophils Relative %: 74 %
Platelet Count: 191 10*3/uL (ref 150–400)
RBC: 3.88 MIL/uL — ABNORMAL LOW (ref 4.22–5.81)
RDW: 13 % (ref 11.5–15.5)
WBC Count: 5.6 10*3/uL (ref 4.0–10.5)
nRBC: 0 % (ref 0.0–0.2)

## 2020-10-13 LAB — CMP (CANCER CENTER ONLY)
ALT: 15 U/L (ref 0–44)
AST: 14 U/L — ABNORMAL LOW (ref 15–41)
Albumin: 3.5 g/dL (ref 3.5–5.0)
Alkaline Phosphatase: 84 U/L (ref 38–126)
Anion gap: 9 (ref 5–15)
BUN: 19 mg/dL (ref 8–23)
CO2: 26 mmol/L (ref 22–32)
Calcium: 9.1 mg/dL (ref 8.9–10.3)
Chloride: 105 mmol/L (ref 98–111)
Creatinine: 1.03 mg/dL (ref 0.61–1.24)
GFR, Estimated: >60 mL/min (ref >60)
Glucose, Bld: 96 mg/dL (ref 70–99)
Potassium: 3.7 mmol/L (ref 3.5–5.1)
Sodium: 140 mmol/L (ref 135–145)
Total Bilirubin: 0.9 mg/dL (ref 0.3–1.2)
Total Protein: 6.8 g/dL (ref 6.5–8.1)

## 2020-10-19 ENCOUNTER — Telehealth (HOSPITAL_COMMUNITY): Payer: Self-pay | Admitting: Cardiology

## 2020-10-19 NOTE — Telephone Encounter (Signed)
Patient cancelled echocardiogram for reason below:  09/18/2020 10:55 AM FV:[id number], Jeanette Caprice E  Cancel Rsn: Patient (patient states his schedule is extremely full right now with various appointments/will call back to reschedule when he has more availability)  Order will be removed from the active echo WQ and when patient calls back to reschedule we will reinstate the order. Thank you.

## 2020-10-21 ENCOUNTER — Other Ambulatory Visit: Payer: Self-pay

## 2020-10-21 ENCOUNTER — Inpatient Hospital Stay: Payer: PPO

## 2020-10-21 DIAGNOSIS — Z5112 Encounter for antineoplastic immunotherapy: Secondary | ICD-10-CM | POA: Diagnosis not present

## 2020-10-21 DIAGNOSIS — C3491 Malignant neoplasm of unspecified part of right bronchus or lung: Secondary | ICD-10-CM

## 2020-10-21 LAB — CBC WITH DIFFERENTIAL (CANCER CENTER ONLY)
Abs Immature Granulocytes: 0.16 10*3/uL — ABNORMAL HIGH (ref 0.00–0.07)
Basophils Absolute: 0 10*3/uL (ref 0.0–0.1)
Basophils Relative: 0 %
Eosinophils Absolute: 0.4 10*3/uL (ref 0.0–0.5)
Eosinophils Relative: 4 %
HCT: 38.1 % — ABNORMAL LOW (ref 39.0–52.0)
Hemoglobin: 12.5 g/dL — ABNORMAL LOW (ref 13.0–17.0)
Immature Granulocytes: 2 %
Lymphocytes Relative: 12 %
Lymphs Abs: 1.1 10*3/uL (ref 0.7–4.0)
MCH: 30.6 pg (ref 26.0–34.0)
MCHC: 32.8 g/dL (ref 30.0–36.0)
MCV: 93.4 fL (ref 80.0–100.0)
Monocytes Absolute: 0.5 10*3/uL (ref 0.1–1.0)
Monocytes Relative: 6 %
Neutro Abs: 6.7 10*3/uL (ref 1.7–7.7)
Neutrophils Relative %: 76 %
Platelet Count: 143 10*3/uL — ABNORMAL LOW (ref 150–400)
RBC: 4.08 MIL/uL — ABNORMAL LOW (ref 4.22–5.81)
RDW: 12.9 % (ref 11.5–15.5)
WBC Count: 8.8 10*3/uL (ref 4.0–10.5)
nRBC: 0 % (ref 0.0–0.2)

## 2020-10-21 LAB — CMP (CANCER CENTER ONLY)
ALT: 18 U/L (ref 0–44)
AST: 14 U/L — ABNORMAL LOW (ref 15–41)
Albumin: 3.8 g/dL (ref 3.5–5.0)
Alkaline Phosphatase: 86 U/L (ref 38–126)
Anion gap: 9 (ref 5–15)
BUN: 8 mg/dL (ref 8–23)
CO2: 26 mmol/L (ref 22–32)
Calcium: 9.2 mg/dL (ref 8.9–10.3)
Chloride: 107 mmol/L (ref 98–111)
Creatinine: 1 mg/dL (ref 0.61–1.24)
GFR, Estimated: >60 mL/min (ref >60)
Glucose, Bld: 101 mg/dL — ABNORMAL HIGH (ref 70–99)
Potassium: 4.2 mmol/L (ref 3.5–5.1)
Sodium: 142 mmol/L (ref 135–145)
Total Bilirubin: 0.7 mg/dL (ref 0.3–1.2)
Total Protein: 7.3 g/dL (ref 6.5–8.1)

## 2020-10-22 LAB — GUARDANT 360

## 2020-10-23 ENCOUNTER — Ambulatory Visit: Payer: PPO | Admitting: Pulmonary Disease

## 2020-10-23 DIAGNOSIS — M47812 Spondylosis without myelopathy or radiculopathy, cervical region: Secondary | ICD-10-CM | POA: Diagnosis not present

## 2020-10-23 DIAGNOSIS — M150 Primary generalized (osteo)arthritis: Secondary | ICD-10-CM | POA: Diagnosis not present

## 2020-10-23 DIAGNOSIS — G894 Chronic pain syndrome: Secondary | ICD-10-CM | POA: Diagnosis not present

## 2020-10-23 DIAGNOSIS — Z79891 Long term (current) use of opiate analgesic: Secondary | ICD-10-CM | POA: Diagnosis not present

## 2020-10-28 ENCOUNTER — Other Ambulatory Visit: Payer: Self-pay

## 2020-10-28 ENCOUNTER — Inpatient Hospital Stay: Payer: PPO | Admitting: Internal Medicine

## 2020-10-28 ENCOUNTER — Inpatient Hospital Stay: Payer: PPO

## 2020-10-28 VITALS — BP 126/75 | HR 80 | Temp 97.5°F | Resp 20 | Ht 68.0 in | Wt 242.3 lb

## 2020-10-28 DIAGNOSIS — G8929 Other chronic pain: Secondary | ICD-10-CM | POA: Diagnosis not present

## 2020-10-28 DIAGNOSIS — Z5112 Encounter for antineoplastic immunotherapy: Secondary | ICD-10-CM

## 2020-10-28 DIAGNOSIS — C3491 Malignant neoplasm of unspecified part of right bronchus or lung: Secondary | ICD-10-CM

## 2020-10-28 DIAGNOSIS — Z5111 Encounter for antineoplastic chemotherapy: Secondary | ICD-10-CM | POA: Insufficient documentation

## 2020-10-28 LAB — CMP (CANCER CENTER ONLY)
ALT: 17 U/L (ref 0–44)
AST: 30 U/L (ref 15–41)
Albumin: 3.7 g/dL (ref 3.5–5.0)
Alkaline Phosphatase: 68 U/L (ref 38–126)
Anion gap: 9 (ref 5–15)
BUN: 9 mg/dL (ref 8–23)
CO2: 26 mmol/L (ref 22–32)
Calcium: 9.4 mg/dL (ref 8.9–10.3)
Chloride: 104 mmol/L (ref 98–111)
Creatinine: 0.92 mg/dL (ref 0.61–1.24)
GFR, Estimated: >60 mL/min (ref >60)
Glucose, Bld: 114 mg/dL — ABNORMAL HIGH (ref 70–99)
Potassium: 4.3 mmol/L (ref 3.5–5.1)
Sodium: 139 mmol/L (ref 135–145)
Total Bilirubin: 0.7 mg/dL (ref 0.3–1.2)
Total Protein: 7.4 g/dL (ref 6.5–8.1)

## 2020-10-28 LAB — CBC WITH DIFFERENTIAL (CANCER CENTER ONLY)
Abs Immature Granulocytes: 0.02 10*3/uL (ref 0.00–0.07)
Basophils Absolute: 0.1 10*3/uL (ref 0.0–0.1)
Basophils Relative: 1 %
Eosinophils Absolute: 0.1 10*3/uL (ref 0.0–0.5)
Eosinophils Relative: 1 %
HCT: 36.3 % — ABNORMAL LOW (ref 39.0–52.0)
Hemoglobin: 11.9 g/dL — ABNORMAL LOW (ref 13.0–17.0)
Immature Granulocytes: 0 %
Lymphocytes Relative: 13 %
Lymphs Abs: 1 10*3/uL (ref 0.7–4.0)
MCH: 30.4 pg (ref 26.0–34.0)
MCHC: 32.8 g/dL (ref 30.0–36.0)
MCV: 92.6 fL (ref 80.0–100.0)
Monocytes Absolute: 0.8 10*3/uL (ref 0.1–1.0)
Monocytes Relative: 10 %
Neutro Abs: 5.7 10*3/uL (ref 1.7–7.7)
Neutrophils Relative %: 75 %
Platelet Count: 448 10*3/uL — ABNORMAL HIGH (ref 150–400)
RBC: 3.92 MIL/uL — ABNORMAL LOW (ref 4.22–5.81)
RDW: 13.4 % (ref 11.5–15.5)
WBC Count: 7.6 10*3/uL (ref 4.0–10.5)
nRBC: 0 % (ref 0.0–0.2)

## 2020-10-28 LAB — TSH: TSH: 0.464 u[IU]/mL (ref 0.320–4.118)

## 2020-10-28 MED ORDER — SODIUM CHLORIDE 0.9 % IV SOLN
750.0000 mg | Freq: Once | INTRAVENOUS | Status: AC
  Administered 2020-10-28: 750 mg via INTRAVENOUS
  Filled 2020-10-28: qty 75

## 2020-10-28 MED ORDER — SODIUM CHLORIDE 0.9 % IV SOLN
150.0000 mg | Freq: Once | INTRAVENOUS | Status: AC
  Administered 2020-10-28: 150 mg via INTRAVENOUS
  Filled 2020-10-28: qty 150

## 2020-10-28 MED ORDER — PALONOSETRON HCL INJECTION 0.25 MG/5ML
0.2500 mg | Freq: Once | INTRAVENOUS | Status: AC
  Administered 2020-10-28: 0.25 mg via INTRAVENOUS
  Filled 2020-10-28: qty 5

## 2020-10-28 MED ORDER — FOLIC ACID 1 MG PO TABS: 1.0000 mg | ORAL_TABLET | Freq: Every day | ORAL | 3 refills | Status: DC

## 2020-10-28 MED ORDER — SODIUM CHLORIDE 0.9 % IV SOLN: Freq: Once | INTRAVENOUS | Status: AC

## 2020-10-28 MED ORDER — CYANOCOBALAMIN 1000 MCG/ML IJ SOLN
1000.0000 ug | Freq: Once | INTRAMUSCULAR | Status: AC
  Administered 2020-10-28: 1000 ug via INTRAMUSCULAR
  Filled 2020-10-28: qty 1

## 2020-10-28 MED ORDER — SODIUM CHLORIDE 0.9 % IV SOLN
500.0000 mg/m2 | Freq: Once | INTRAVENOUS | Status: AC
  Administered 2020-10-28: 1200 mg via INTRAVENOUS
  Filled 2020-10-28: qty 40

## 2020-10-28 MED ORDER — SODIUM CHLORIDE 0.9 % IV SOLN
200.0000 mg | Freq: Once | INTRAVENOUS | Status: AC
  Administered 2020-10-28: 200 mg via INTRAVENOUS
  Filled 2020-10-28: qty 8

## 2020-10-28 MED ORDER — SODIUM CHLORIDE 0.9 % IV SOLN
10.0000 mg | Freq: Once | INTRAVENOUS | Status: AC
  Administered 2020-10-28: 10 mg via INTRAVENOUS
  Filled 2020-10-28: qty 10

## 2020-10-28 NOTE — Patient Instructions (Signed)
[facility]  Discharge Instructions: Thank you for choosing [facility] to provide your oncology and hematology care.   If you have a lab appointment with the [facility], please go directly to the [facility] and check in at the registration area.   Wear comfortable clothing and clothing appropriate for easy access to any Portacath or PICC line.   We strive to give you quality time with your provider. You may need to reschedule your appointment if you arrive late (15 or more minutes).  Arriving late affects you and other patients whose appointments are after yours.  Also, if you miss three or more appointments without notifying the office, you may be dismissed from the clinic at the provider's discretion.      For prescription refill requests, have your pharmacy contact our office and allow 72 hours for refills to be completed.    Today you received the following chemotherapy and/or immunotherapy agents: Keytruda, alimta, carboplatin.      To help prevent nausea and vomiting after your treatment, we encourage you to take your nausea medication as directed.  BELOW ARE SYMPTOMS THAT SHOULD BE REPORTED IMMEDIATELY: *FEVER GREATER THAN 100.4 F (38 C) OR HIGHER *CHILLS OR SWEATING *NAUSEA AND VOMITING THAT IS NOT CONTROLLED WITH YOUR NAUSEA MEDICATION *UNUSUAL SHORTNESS OF BREATH *UNUSUAL BRUISING OR BLEEDING *URINARY PROBLEMS (pain or burning when urinating, or frequent urination) *BOWEL PROBLEMS (unusual diarrhea, constipation, pain near the anus) TENDERNESS IN MOUTH AND THROAT WITH OR WITHOUT PRESENCE OF ULCERS (sore throat, sores in mouth, or a toothache) UNUSUAL RASH, SWELLING OR PAIN  UNUSUAL VAGINAL DISCHARGE OR ITCHING   Items with * indicate a potential emergency and should be followed up as soon as possible or go to the Emergency Department if any problems should occur.  Please show the CHEMOTHERAPY ALERT CARD or IMMUNOTHERAPY ALERT  CARD at check-in to the Emergency Department and triage nurse.  Should you have questions after your visit or need to cancel or reschedule your appointment, please contact [facility]  Dept: [phone]  and follow the prompts.  Office hours are 8:00 a.m. to 4:30 p.m. Monday - Friday. Please note that voicemails left after 4:00 p.m. may not be returned until the following business day.  We are closed weekends and major holidays. You have access to a nurse at all times for urgent questions. Please call the main number to the clinic Dept: [phone] and follow the prompts.   For any non-urgent questions, you may also contact your provider using MyChart. We now offer e-Visits for anyone 26 and older to request care online for non-urgent symptoms. For details visit [email or web address].   Also download the MyChart app! Go to the app store, search "MyChart", open the app, select [facility], and log in with your MyChart username and password.  Due to Covid, a mask is required upon entering the hospital/clinic. If you do not have a mask, one will be given to you upon arrival. For doctor visits, patients may have 1 support person aged 71 or older with them. For treatment visits, patients cannot have anyone with them due to current Covid guidelines and our immunocompromised population.   Pembrolizumab injection What is this medication? PEMBROLIZUMAB (pem broe liz ue mab) is a monoclonal antibody. It is used totreat certain types of cancer. This medicine may be used for other purposes; ask your health care provider orpharmacist if you have questions. COMMON BRAND NAME(S): Keytruda What  should I tell my care team before I take this medication? They need to know if you have any of these conditions: autoimmune diseases like Crohn's disease, ulcerative colitis, or lupus have had or planning to have an allogeneic stem cell transplant (uses someone else's stem cells) history  of organ transplant history of chest radiation nervous system problems like myasthenia gravis or Guillain-Barre syndrome an unusual or allergic reaction to pembrolizumab, other medicines, foods, dyes, or preservatives pregnant or trying to get pregnant breast-feeding How should I use this medication? This medicine is for infusion into a vein. It is given by a health careprofessional in a hospital or clinic setting. A special MedGuide will be given to you before each treatment. Be sure to readthis information carefully each time. Talk to your pediatrician regarding the use of this medicine in children. While this drug may be prescribed for children as young as 6 months for selectedconditions, precautions do apply. Overdosage: If you think you have taken too much of this medicine contact apoison control center or emergency room at once. NOTE: This medicine is only for you. Do not share this medicine with others. What if I miss a dose? It is important not to miss your dose. Call your doctor or health careprofessional if you are unable to keep an appointment. What may interact with this medication? Interactions have not been studied. This list may not describe all possible interactions. Give your health care provider a list of all the medicines, herbs, non-prescription drugs, or dietary supplements you use. Also tell them if you smoke, drink alcohol, or use illegaldrugs. Some items may interact with your medicine. What should I watch for while using this medication? Your condition will be monitored carefully while you are receiving thismedicine. You may need blood work done while you are taking this medicine. Do not become pregnant while taking this medicine or for 4 months after stopping it. Women should inform their doctor if they wish to become pregnant or think they might be pregnant. There is a potential for serious side effects to an unborn child. Talk to your health care professional or  pharmacist for more information. Do not breast-feed an infant while taking this medicine orfor 4 months after the last dose. What side effects may I notice from receiving this medication? Side effects that you should report to your doctor or health care professionalas soon as possible: allergic reactions like skin rash, itching or hives, swelling of the face, lips, or tongue bloody or black, tarry breathing problems changes in vision chest pain chills confusion constipation cough diarrhea dizziness or feeling faint or lightheaded fast or irregular heartbeat fever flushing joint pain low blood counts - this medicine may decrease the number of white blood cells, red blood cells and platelets. You may be at increased risk for infections and bleeding. muscle pain muscle weakness pain, tingling, numbness in the hands or feet persistent headache redness, blistering, peeling or loosening of the skin, including inside the mouth signs and symptoms of high blood sugar such as dizziness; dry mouth; dry skin; fruity breath; nausea; stomach pain; increased hunger or thirst; increased urination signs and symptoms of kidney injury like trouble passing urine or change in the amount of urine signs and symptoms of liver injury like dark urine, light-colored stools, loss of appetite, nausea, right upper belly pain, yellowing of the eyes or skin sweating swollen lymph nodes weight loss Side effects that usually do not require medical attention (report to yourdoctor or health care professional if  they continue or are bothersome): decreased appetite hair loss tiredness This list may not describe all possible side effects. Call your doctor for medical advice about side effects. You may report side effects to FDA at1-800-FDA-1088. Where should I keep my medication? This drug is given in a hospital or clinic and will not be stored at home. NOTE: This sheet is a summary. It may not cover all possible  information. If you have questions about this medicine, talk to your doctor, pharmacist, orhealth care provider.  2022 Elsevier/Gold Standard (2019-01-23 21:44:53)  Pemetrexed injection What is this medication? PEMETREXED (PEM e TREX ed) is a chemotherapy drug used to treat lung cancers like non-small cell lung cancer and mesothelioma. It may also be used to treatother cancers. This medicine may be used for other purposes; ask your health care provider orpharmacist if you have questions. COMMON BRAND NAME(S): Alimta What should I tell my care team before I take this medication? They need to know if you have any of these conditions: infection (especially a virus infection such as chickenpox, cold sores, or herpes) kidney disease low blood counts, like low white cell, platelet, or red cell counts lung or breathing disease, like asthma radiation therapy an unusual or allergic reaction to pemetrexed, other medicines, foods, dyes, or preservative pregnant or trying to get pregnant breast-feeding How should I use this medication? This drug is given as an infusion into a vein. It is administered in a hospitalor clinic by a specially trained health care professional. Talk to your pediatrician regarding the use of this medicine in children.Special care may be needed. Overdosage: If you think you have taken too much of this medicine contact apoison control center or emergency room at once. NOTE: This medicine is only for you. Do not share this medicine with others. What if I miss a dose? It is important not to miss your dose. Call your doctor or health careprofessional if you are unable to keep an appointment. What may interact with this medication? This medicine may interact with the following medications: Ibuprofen This list may not describe all possible interactions. Give your health care provider a list of all the medicines, herbs, non-prescription drugs, or dietary supplements you use. Also  tell them if you smoke, drink alcohol, or use illegaldrugs. Some items may interact with your medicine. What should I watch for while using this medication? Visit your doctor for checks on your progress. This drug may make you feel generally unwell. This is not uncommon, as chemotherapy can affect healthy cells as well as cancer cells. Report any side effects. Continue your course oftreatment even though you feel ill unless your doctor tells you to stop. In some cases, you may be given additional medicines to help with side effects.Follow all directions for their use. Call your doctor or health care professional for advice if you get a fever, chills or sore throat, or other symptoms of a cold or flu. Do not treat yourself. This drug decreases your body's ability to fight infections. Try toavoid being around people who are sick. This medicine may increase your risk to bruise or bleed. Call your doctor orhealth care professional if you notice any unusual bleeding. Be careful brushing and flossing your teeth or using a toothpick because you may get an infection or bleed more easily. If you have any dental work done,tell your dentist you are receiving this medicine. Avoid taking products that contain aspirin, acetaminophen, ibuprofen, naproxen, or ketoprofen unless instructed by your doctor. These medicines may  hide afever. Call your doctor or health care professional if you get diarrhea or mouthsores. Do not treat yourself. To protect your kidneys, drink water or other fluids as directed while you aretaking this medicine. Do not become pregnant while taking this medicine or for 6 months after stopping it. Women should inform their doctor if they wish to become pregnant or think they might be pregnant. Men should not father a child while taking this medicine and for 3 months after stopping it. This may interfere with the ability to father a child. You should talk to your doctor or health care professional if  you are concerned about your fertility. There is a potential for serious side effects to an unborn child. Talk to your health care professional or pharmacist for more information. Do not breast-feed an infantwhile taking this medicine or for 1 week after stopping it. What side effects may I notice from receiving this medication? Side effects that you should report to your doctor or health care professionalas soon as possible: allergic reactions like skin rash, itching or hives, swelling of the face, lips, or tongue breathing problems redness, blistering, peeling or loosening of the skin, including inside the mouth signs and symptoms of bleeding such as bloody or black, tarry stools; red or dark-brown urine; spitting up blood or brown material that looks like coffee grounds; red spots on the skin; unusual bruising or bleeding from the eye, gums, or nose signs and symptoms of infection like fever or chills; cough; sore throat; pain or trouble passing urine signs and symptoms of kidney injury like trouble passing urine or change in the amount of urine signs and symptoms of liver injury like dark yellow or brown urine; general ill feeling or flu-like symptoms; light-colored stools; loss of appetite; nausea; right upper belly pain; unusually weak or tired; yellowing of the eyes or skin Side effects that usually do not require medical attention (report to yourdoctor or health care professional if they continue or are bothersome): constipation mouth sores nausea, vomiting unusually weak or tired This list may not describe all possible side effects. Call your doctor for medical advice about side effects. You may report side effects to FDA at1-800-FDA-1088. Where should I keep my medication? This drug is given in a hospital or clinic and will not be stored at home. NOTE: This sheet is a summary. It may not cover all possible information. If you have questions about this medicine, talk to your doctor,  pharmacist, orhealth care provider.  2022 Elsevier/Gold Standard (2017-04-12 16:11:33)  Carboplatin injection What is this medication? CARBOPLATIN (KAR boe pla tin) is a chemotherapy drug. It targets fast dividing cells, like cancer cells, and causes these cells to die. This medicine is usedto treat ovarian cancer and many other cancers. This medicine may be used for other purposes; ask your health care provider orpharmacist if you have questions. COMMON BRAND NAME(S): Paraplatin What should I tell my care team before I take this medication? They need to know if you have any of these conditions: blood disorders hearing problems kidney disease recent or ongoing radiation therapy an unusual or allergic reaction to carboplatin, cisplatin, other chemotherapy, other medicines, foods, dyes, or preservatives pregnant or trying to get pregnant breast-feeding How should I use this medication? This drug is usually given as an infusion into a vein. It is administered in [facility] or clinic by a specially trained health care professional. Talk to your pediatrician regarding the use of this medicine in children.Special care may be needed.  Overdosage: If you think you have taken too much of this medicine contact apoison control center or emergency room at once. NOTE: This medicine is only for you. Do not share this medicine with others. What if I miss a dose? It is important not to miss a dose. Call your doctor or health careprofessional if you are unable to keep an appointment. What may interact with this medication? medicines for seizures medicines to increase blood counts like filgrastim, pegfilgrastim, sargramostim some antibiotics like amikacin, gentamicin, neomycin, streptomycin, tobramycin vaccines Talk to your doctor or health care professional before taking any of thesemedicines: acetaminophen aspirin ibuprofen ketoprofen naproxen This list may not describe all possible interactions.  Give your health care provider a list of all the medicines, herbs, non-prescription drugs, or dietary supplements you use. Also tell them if you smoke, drink alcohol, or use illegaldrugs. Some items may interact with your medicine. What should I watch for while using this medication? Your condition will be monitored carefully while you are receiving this medicine. You will need important blood work done while you are taking thismedicine. This drug may make you feel generally unwell. This is not uncommon, as chemotherapy can affect healthy cells as well as cancer cells. Report any side effects. Continue your course of treatment even though you feel ill unless yourdoctor tells you to stop. In some cases, you may be given additional medicines to help with side effects.Follow all directions for their use. Call your doctor or health care professional for advice if you get a fever, chills or sore throat, or other symptoms of a cold or flu. Do not treat yourself. This drug decreases your body's ability to fight infections. Try toavoid being around people who are sick. This medicine may increase your risk to bruise or bleed. Call your doctor orhealth care professional if you notice any unusual bleeding. Be careful brushing and flossing your teeth or using a toothpick because you may get an infection or bleed more easily. If you have any dental work done,tell your dentist you are receiving this medicine. Avoid taking products that contain aspirin, acetaminophen, ibuprofen, naproxen, or ketoprofen unless instructed by your doctor. These medicines may hide afever. Do not become pregnant while taking this medicine. Women should inform their doctor if they wish to become pregnant or think they might be pregnant. There is a potential for serious side effects to an unborn child. Talk to your health care professional or pharmacist for more information. Do not breast-feed aninfant while taking this medicine. What side  effects may I notice from receiving this medication? Side effects that you should report to your doctor or health care professionalas soon as possible: allergic reactions like skin rash, itching or hives, swelling of the face, lips, or tongue signs of infection - fever or chills, cough, sore throat, pain or difficulty passing urine signs of decreased platelets or bleeding - bruising, pinpoint red spots on the skin, black, tarry stools, nosebleeds signs of decreased red blood cells - unusually weak or tired, fainting spells, lightheadedness breathing problems changes in hearing changes in vision chest pain high blood pressure low blood counts - This drug may decrease the number of white blood cells, red blood cells and platelets. You may be at increased risk for infections and bleeding. nausea and vomiting pain, swelling, redness or irritation at the injection site pain, tingling, numbness in the hands or feet problems with balance, talking, walking trouble passing urine or change in the amount of urine Side effects that usually  do not require medical attention (report to yourdoctor or health care professional if they continue or are bothersome): hair loss loss of appetite metallic taste in the mouth or changes in taste This list may not describe all possible side effects. Call your doctor for medical advice about side effects. You may report side effects to FDA at1-800-FDA-1088. Where should I keep my medication? This drug is given in a hospital or clinic and will not be stored at home. NOTE: This sheet is a summary. It may not cover all possible information. If you have questions about this medicine, talk to your doctor, pharmacist, orhealth care provider.  2022 Elsevier/Gold Standard (2007-05-29 14:38:05)
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
## 2020-10-28 NOTE — Progress Notes (Signed)
DISCONTINUE ON PATHWAY REGIMEN - Non-Small Cell Lung     A cycle is every 21 days:     Pembrolizumab      Paclitaxel      Carboplatin   **Always confirm dose/schedule in your pharmacy ordering system**  REASON: Other Reason PRIOR TREATMENT: [id number]: Pembrolizumab 200 mg + Carboplatin AUC=6 + Paclitaxel 200 mg/m2 q21 Days x 4 Cycles TREATMENT RESPONSE: Unable to Evaluate  START ON PATHWAY REGIMEN - Non-Small Cell Lung     A cycle is every 21 days:     Pembrolizumab      Pemetrexed      Carboplatin   **Always confirm dose/schedule in your pharmacy ordering system**  Patient Characteristics: Stage IV Metastatic, Nonsquamous, Molecular Analysis Completed, Molecular Alteration Present and Targeted Therapy Exhausted OR EGFR Exon 20+ or KRAS G12C+ Present and No Prior Chemo/Immunotherapy OR No Alteration Present, Initial  Chemotherapy/Immunotherapy, PS = 0, 1, BRAF/MET/KRAS Mutation Positive, Candidate for Immunotherapy, PD-L1 Expression Positive 1-49% (TPS) / Negative / Not Tested / Awaiting Test Results and Immunotherapy Candidate Therapeutic Status: Stage IV Metastatic Histology: Nonsquamous Cell Broad Molecular Profiling Status: Molecular Analysis Completed Molecular Analysis Results: KRAS G12C Mutation Present and No Prior Chemo/Immunotherapy ECOG Performance Status: 1 Chemotherapy/Immunotherapy Line of Therapy: Initial Chemotherapy/Immunotherapy Immunotherapy Candidate Status: Candidate for Immunotherapy PD-L1 Expression Status: Quantity Not Sufficient Intent of Therapy: Non-Curative / Palliative Intent, Discussed with Patient

## 2020-10-29 ENCOUNTER — Telehealth: Payer: Self-pay | Admitting: *Deleted

## 2020-10-30 ENCOUNTER — Telehealth: Payer: Self-pay

## 2020-10-30 ENCOUNTER — Inpatient Hospital Stay: Payer: PPO

## 2020-10-30 NOTE — Telephone Encounter (Signed)
Call received from the front office stating the pt and his sister, Ms. Charlott Holler, were in the lobby upset that his injection appt was cancelled.  I reviewed 10/28/20 progress note which states "Based on the recent finding on the molecular studies was positive for KRAS G12C mutation, his histology is likely adenocarcinoma and I will switch his treatment to carboplatin for AUC of 5, Alimta 500 Mg/M2 and Keytruda 200 Mg IV every 3 weeks with no Neulasta injection".  Pts sister states she was told he still needs the injections. I reiterated the above. Pts sister then stated she received a reminder call from the injection appt. I then advised the injection appt was cancelled the same morning as his 10/28/20 because his tx changed. (Records indicated the appt was cancelled 10/28/20 at 9:29am). I asked Ms. Caudle if she maybe didn't check her VM until after the appt because the automated reminder call likely was made the morning of 10/28/20 just prior to [place] cancelling it. She wasn't sure but was still upset and feels no one told her or made his new  tx clear to her. I apologized to her for her experience and she was happy that she had to make one less trip to the [place] since he no longer needs the injections.

## 2020-11-04 ENCOUNTER — Telehealth: Payer: Self-pay | Admitting: Medical Oncology

## 2020-11-04 ENCOUNTER — Other Ambulatory Visit: Payer: Self-pay

## 2020-11-04 ENCOUNTER — Inpatient Hospital Stay: Payer: PPO

## 2020-11-04 ENCOUNTER — Telehealth: Payer: Self-pay | Admitting: Physician Assistant

## 2020-11-04 DIAGNOSIS — Z5112 Encounter for antineoplastic immunotherapy: Secondary | ICD-10-CM | POA: Diagnosis not present

## 2020-11-04 DIAGNOSIS — C3491 Malignant neoplasm of unspecified part of right bronchus or lung: Secondary | ICD-10-CM

## 2020-11-04 LAB — CBC WITH DIFFERENTIAL (CANCER CENTER ONLY)
Abs Immature Granulocytes: 0 10*3/uL (ref 0.00–0.07)
Basophils Absolute: 0 10*3/uL (ref 0.0–0.1)
Basophils Relative: 0 %
Eosinophils Absolute: 0.5 10*3/uL (ref 0.0–0.5)
Eosinophils Relative: 18 %
HCT: 30.6 % — ABNORMAL LOW (ref 39.0–52.0)
Hemoglobin: 10.3 g/dL — ABNORMAL LOW (ref 13.0–17.0)
Immature Granulocytes: 0 %
Lymphocytes Relative: 26 %
Lymphs Abs: 0.8 10*3/uL (ref 0.7–4.0)
MCH: 30.7 pg (ref 26.0–34.0)
MCHC: 33.7 g/dL (ref 30.0–36.0)
MCV: 91.1 fL (ref 80.0–100.0)
Monocytes Absolute: 0.1 10*3/uL (ref 0.1–1.0)
Monocytes Relative: 3 %
Neutro Abs: 1.6 10*3/uL — ABNORMAL LOW (ref 1.7–7.7)
Neutrophils Relative %: 53 %
Platelet Count: 255 10*3/uL (ref 150–400)
RBC: 3.36 MIL/uL — ABNORMAL LOW (ref 4.22–5.81)
RDW: 13.2 % (ref 11.5–15.5)
WBC Count: 3 10*3/uL — ABNORMAL LOW (ref 4.0–10.5)
nRBC: 0 % (ref 0.0–0.2)

## 2020-11-04 LAB — CMP (CANCER CENTER ONLY)
ALT: 27 U/L (ref 0–44)
AST: 21 U/L (ref 15–41)
Albumin: 3.9 g/dL (ref 3.5–5.0)
Alkaline Phosphatase: 65 U/L (ref 38–126)
Anion gap: 8 (ref 5–15)
BUN: 26 mg/dL — ABNORMAL HIGH (ref 8–23)
CO2: 26 mmol/L (ref 22–32)
Calcium: 9.4 mg/dL (ref 8.9–10.3)
Chloride: 104 mmol/L (ref 98–111)
Creatinine: 1.03 mg/dL (ref 0.61–1.24)
GFR, Estimated: >60 mL/min (ref >60)
Glucose, Bld: 91 mg/dL (ref 70–99)
Potassium: 5.7 mmol/L — ABNORMAL HIGH (ref 3.5–5.1)
Sodium: 138 mmol/L (ref 135–145)
Total Bilirubin: 1.2 mg/dL (ref 0.3–1.2)
Total Protein: 7.3 g/dL (ref 6.5–8.1)

## 2020-11-04 NOTE — Telephone Encounter (Signed)
Pts sister called back. I asked if pt was taking a potassium supplement or eating a lot of K rich foods such as banana's. Pt was present for the conversation and he states he has been eating 4-5 banana's a day but will skip a few days and will eat only one banana a day moving forward.
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
## 2020-11-04 NOTE — Telephone Encounter (Addendum)
Reports onset of "trouble breathing " that started yesterday.Denies light headedness, chest pain with deep breath or palptitations" I hurt all over". He sounds short of breath over the phone.  He has been on several errands today and "feels worn out". Recently ( not today ) he was trimming bushes and painting.   I advised pt to go to ED at med [facility] or [facility].   He said 'it is not that bad. I'll just see if it gets better and if not I'll go to ED".

## 2020-11-04 NOTE — Telephone Encounter (Signed)
I attempted to call the patient as well as his emergency contact, his sister, [name]. I was unable to reach them. His potassium is elevated today at 5.7. I was calling to see if he was taking any potassium supplements, potassium rich foods, or multivitamins. If so, we would recommend him to discontinue it and recheck his labs. I spoke to Dr. Julien Nordmann about his labs who would like to bring the patient back in tomorrow for a lab only visit. Unable to reach the patient or his emergency contact. Left a voicemail to call us back to discuss his labs from today. Will send a scheduling message for a lab visit tomorrow in hopes we can reach them by then.

## 2020-11-04 NOTE — Telephone Encounter (Signed)
Scheduled per sch msg. Called and left msg  

## 2020-11-05 ENCOUNTER — Inpatient Hospital Stay: Payer: PPO | Attending: Physician Assistant

## 2020-11-05 ENCOUNTER — Telehealth: Payer: Self-pay | Admitting: Physician Assistant

## 2020-11-05 DIAGNOSIS — R309 Painful micturition, unspecified: Secondary | ICD-10-CM | POA: Insufficient documentation

## 2020-11-05 DIAGNOSIS — Z5112 Encounter for antineoplastic immunotherapy: Secondary | ICD-10-CM | POA: Diagnosis not present

## 2020-11-05 DIAGNOSIS — Z79899 Other long term (current) drug therapy: Secondary | ICD-10-CM | POA: Insufficient documentation

## 2020-11-05 DIAGNOSIS — D701 Agranulocytosis secondary to cancer chemotherapy: Secondary | ICD-10-CM | POA: Insufficient documentation

## 2020-11-05 DIAGNOSIS — Z5111 Encounter for antineoplastic chemotherapy: Secondary | ICD-10-CM | POA: Insufficient documentation

## 2020-11-05 DIAGNOSIS — K921 Melena: Secondary | ICD-10-CM | POA: Diagnosis not present

## 2020-11-05 DIAGNOSIS — C3431 Malignant neoplasm of lower lobe, right bronchus or lung: Secondary | ICD-10-CM | POA: Diagnosis not present

## 2020-11-05 DIAGNOSIS — C7972 Secondary malignant neoplasm of left adrenal gland: Secondary | ICD-10-CM | POA: Insufficient documentation

## 2020-11-05 DIAGNOSIS — E860 Dehydration: Secondary | ICD-10-CM | POA: Insufficient documentation

## 2020-11-05 DIAGNOSIS — C3491 Malignant neoplasm of unspecified part of right bronchus or lung: Secondary | ICD-10-CM

## 2020-11-05 LAB — CBC WITH DIFFERENTIAL (CANCER CENTER ONLY)
Abs Immature Granulocytes: 0.01 10*3/uL (ref 0.00–0.07)
Basophils Absolute: 0 10*3/uL (ref 0.0–0.1)
Basophils Relative: 0 %
Eosinophils Absolute: 0.4 10*3/uL (ref 0.0–0.5)
Eosinophils Relative: 14 %
HCT: 30.6 % — ABNORMAL LOW (ref 39.0–52.0)
Hemoglobin: 10.1 g/dL — ABNORMAL LOW (ref 13.0–17.0)
Immature Granulocytes: 0 %
Lymphocytes Relative: 24 %
Lymphs Abs: 0.7 10*3/uL (ref 0.7–4.0)
MCH: 30.5 pg (ref 26.0–34.0)
MCHC: 33 g/dL (ref 30.0–36.0)
MCV: 92.4 fL (ref 80.0–100.0)
Monocytes Absolute: 0.1 10*3/uL (ref 0.1–1.0)
Monocytes Relative: 3 %
Neutro Abs: 1.8 10*3/uL (ref 1.7–7.7)
Neutrophils Relative %: 59 %
Platelet Count: 192 10*3/uL (ref 150–400)
RBC: 3.31 MIL/uL — ABNORMAL LOW (ref 4.22–5.81)
RDW: 13.1 % (ref 11.5–15.5)
WBC Count: 3.1 10*3/uL — ABNORMAL LOW (ref 4.0–10.5)
nRBC: 0 % (ref 0.0–0.2)

## 2020-11-05 LAB — CMP (CANCER CENTER ONLY)
ALT: 21 U/L (ref 0–44)
AST: 18 U/L (ref 15–41)
Albumin: 3.7 g/dL (ref 3.5–5.0)
Alkaline Phosphatase: 69 U/L (ref 38–126)
Anion gap: 8 (ref 5–15)
BUN: 24 mg/dL — ABNORMAL HIGH (ref 8–23)
CO2: 25 mmol/L (ref 22–32)
Calcium: 8.9 mg/dL (ref 8.9–10.3)
Chloride: 104 mmol/L (ref 98–111)
Creatinine: 1.04 mg/dL (ref 0.61–1.24)
GFR, Estimated: >60 mL/min (ref >60)
Glucose, Bld: 84 mg/dL (ref 70–99)
Potassium: 4.8 mmol/L (ref 3.5–5.1)
Sodium: 137 mmol/L (ref 135–145)
Total Bilirubin: 0.8 mg/dL (ref 0.3–1.2)
Total Protein: 7 g/dL (ref 6.5–8.1)

## 2020-11-05 NOTE — Telephone Encounter (Signed)
I called the patient and spoke to his sister about his repeat lab work today.  The patient's potassium was elevated yesterday and it was discovered that he was eating an excessive amount of potassium rich food (bananas).  He was advised to stop eating bananas and to recheck his blood work.  His potassium is back down to normal today.  I reviewed the results with his sister and advised him to avoid eating excessive amounts of potasium rich food and in the future and the importance of having your potassium within normal limits to avoid cardiac dysfunction.  They expressed understanding.

## 2020-11-10 ENCOUNTER — Ambulatory Visit: Payer: PPO | Admitting: General Practice

## 2020-11-11 ENCOUNTER — Inpatient Hospital Stay: Payer: PPO

## 2020-11-11 ENCOUNTER — Other Ambulatory Visit: Payer: Self-pay

## 2020-11-11 ENCOUNTER — Other Ambulatory Visit: Payer: Self-pay | Admitting: Physician Assistant

## 2020-11-11 ENCOUNTER — Telehealth: Payer: Self-pay

## 2020-11-11 ENCOUNTER — Other Ambulatory Visit: Payer: Self-pay | Admitting: Cardiology

## 2020-11-11 ENCOUNTER — Other Ambulatory Visit: Payer: Self-pay | Admitting: Internal Medicine

## 2020-11-11 DIAGNOSIS — T451X5A Adverse effect of antineoplastic and immunosuppressive drugs, initial encounter: Secondary | ICD-10-CM | POA: Insufficient documentation

## 2020-11-11 DIAGNOSIS — Z79891 Long term (current) use of opiate analgesic: Secondary | ICD-10-CM | POA: Diagnosis not present

## 2020-11-11 DIAGNOSIS — C3491 Malignant neoplasm of unspecified part of right bronchus or lung: Secondary | ICD-10-CM

## 2020-11-11 DIAGNOSIS — Z5112 Encounter for antineoplastic immunotherapy: Secondary | ICD-10-CM | POA: Diagnosis not present

## 2020-11-11 DIAGNOSIS — M150 Primary generalized (osteo)arthritis: Secondary | ICD-10-CM | POA: Diagnosis not present

## 2020-11-11 DIAGNOSIS — G893 Neoplasm related pain (acute) (chronic): Secondary | ICD-10-CM | POA: Diagnosis not present

## 2020-11-11 DIAGNOSIS — D701 Agranulocytosis secondary to cancer chemotherapy: Secondary | ICD-10-CM

## 2020-11-11 DIAGNOSIS — G894 Chronic pain syndrome: Secondary | ICD-10-CM | POA: Diagnosis not present

## 2020-11-11 LAB — CMP (CANCER CENTER ONLY)
ALT: 16 U/L (ref 0–44)
AST: 21 U/L (ref 15–41)
Albumin: 3.5 g/dL (ref 3.5–5.0)
Alkaline Phosphatase: 59 U/L (ref 38–126)
Anion gap: 12 (ref 5–15)
BUN: 25 mg/dL — ABNORMAL HIGH (ref 8–23)
CO2: 23 mmol/L (ref 22–32)
Calcium: 9.5 mg/dL (ref 8.9–10.3)
Chloride: 101 mmol/L (ref 98–111)
Creatinine: 1.5 mg/dL — ABNORMAL HIGH (ref 0.61–1.24)
GFR, Estimated: 52 mL/min — ABNORMAL LOW (ref >60)
Glucose, Bld: 121 mg/dL — ABNORMAL HIGH (ref 70–99)
Potassium: 4.2 mmol/L (ref 3.5–5.1)
Sodium: 136 mmol/L (ref 135–145)
Total Bilirubin: 1.3 mg/dL — ABNORMAL HIGH (ref 0.3–1.2)
Total Protein: 7.6 g/dL (ref 6.5–8.1)

## 2020-11-11 LAB — CBC WITH DIFFERENTIAL (CANCER CENTER ONLY)
Abs Immature Granulocytes: 0 10*3/uL (ref 0.00–0.07)
Basophils Absolute: 0 10*3/uL (ref 0.0–0.1)
Basophils Relative: 0 %
Eosinophils Absolute: 0 10*3/uL (ref 0.0–0.5)
Eosinophils Relative: 3 %
HCT: 27.2 % — ABNORMAL LOW (ref 39.0–52.0)
Hemoglobin: 9.1 g/dL — ABNORMAL LOW (ref 13.0–17.0)
Immature Granulocytes: 0 %
Lymphocytes Relative: 39 %
Lymphs Abs: 0.5 10*3/uL — ABNORMAL LOW (ref 0.7–4.0)
MCH: 30.1 pg (ref 26.0–34.0)
MCHC: 33.5 g/dL (ref 30.0–36.0)
MCV: 90.1 fL (ref 80.0–100.0)
Monocytes Absolute: 0.4 10*3/uL (ref 0.1–1.0)
Monocytes Relative: 26 %
Neutro Abs: 0.4 10*3/uL — CL (ref 1.7–7.7)
Neutrophils Relative %: 32 %
Platelet Count: 39 10*3/uL — ABNORMAL LOW (ref 150–400)
RBC: 3.02 MIL/uL — ABNORMAL LOW (ref 4.22–5.81)
RDW: 12.3 % (ref 11.5–15.5)
WBC Count: 1.4 10*3/uL — ABNORMAL LOW (ref 4.0–10.5)
nRBC: 0 % (ref 0.0–0.2)

## 2020-11-11 MED ORDER — FILGRASTIM-AAFI 480 MCG/0.8ML IJ SOSY
480.0000 ug | PREFILLED_SYRINGE | Freq: Once | INTRAMUSCULAR | Status: AC
  Administered 2020-11-11: 480 ug via SUBCUTANEOUS
  Filled 2020-11-11: qty 0.8

## 2020-11-11 NOTE — Telephone Encounter (Signed)
Pt c/o dizziness and feeling some weakness.   Given pts critical lab [place] - 0.44, we are arranged Granix 480mg  for two days. Pt has also been advised to increase his fluid intake. I have also advised pts sister of this as well. Pt then c/o CP 10/10 and was advised he needs to be seen in the ED, then stated the discomfort wasn't that bad and does not feel he needs to be seen in the ED. I questioned the pts change in his complaint and he reiterated that it's not 10/10 and does not feel he needs to be seen. Pt has been advised to go to the nearest ER or call 911 should his condition change. Pt expressed understanding of this information and knows to return for his second injection tomorrow 11/12/20 at 2:15pm. All of this information was also shared with pts sister as well.

## 2020-11-11 NOTE — Progress Notes (Signed)
Pt presents for injection appt today. Pt complains of chest pain, dizziness, trouble breathing and weakness. Dr.Mohamed and [name], PA notified. Pt and pt sister informed to go to ED if worsening of symptoms occur. Pt acknowledged understanding.

## 2020-11-11 NOTE — Telephone Encounter (Signed)
CRITICAL VALUE STICKER  CRITICAL VALUE: ANC = 0.4  RECEIVER (on-site recipient of call): Yetta Glassman, CMA  DATE & TIME NOTIFIED: 11/11/20 at 2:10pm  MESSENGER (representative from lab): Verdis Frederickson  MD NOTIFIED: Dr. Deanna Artis PA-C  TIME OF NOTIFICATION: 11/11/20 at 2:11pm  RESPONSE: Per Dr. Julien Nordmann, pt needs Granix for two days. This is being arranged now.

## 2020-11-12 ENCOUNTER — Inpatient Hospital Stay: Payer: PPO

## 2020-11-12 VITALS — BP 115/54 | HR 85 | Temp 98.0°F | Resp 20

## 2020-11-12 DIAGNOSIS — T451X5A Adverse effect of antineoplastic and immunosuppressive drugs, initial encounter: Secondary | ICD-10-CM

## 2020-11-12 DIAGNOSIS — Z5112 Encounter for antineoplastic immunotherapy: Secondary | ICD-10-CM | POA: Diagnosis not present

## 2020-11-12 MED ORDER — FILGRASTIM-AAFI 480 MCG/0.8ML IJ SOSY
480.0000 ug | PREFILLED_SYRINGE | Freq: Once | INTRAMUSCULAR | Status: AC
  Administered 2020-11-12: 480 ug via SUBCUTANEOUS

## 2020-11-12 NOTE — Patient Instructions (Signed)
Filgrastim, G-CSF injection What is this medication? FILGRASTIM, G-CSF (fil GRA stim) is a granulocyte colony-stimulating factor that stimulates the growth of neutrophils, a type of white blood cell (WBC) important in the body's fight against infection. It is used to reduce the incidence of fever and infection in patients with certain types of cancer who are receiving chemotherapy that affects the bone marrow, to stimulate blood cell production for removal of WBCs from the body prior to a bone marrow transplantation, to reduce the incidence of fever and infection in patients who have severe chronic neutropenia, and to improve survival outcomes following high-dose radiation exposure that is toxic to the bone marrow. This medicine may be used for other purposes; ask your health care provider or pharmacist if you have questions. COMMON BRAND NAME(S): Neupogen, Nivestym, Releuko, Zarxio What should I tell my care team before I take this medication? They need to know if you have any of these conditions: kidney disease latex allergy ongoing radiation therapy sickle cell disease an unusual or allergic reaction to filgrastim, pegfilgrastim, other medicines, foods, dyes, or preservatives pregnant or trying to get pregnant breast-feeding How should I use this medication? This medicine is for injection under the skin or infusion into a vein. As an infusion into a vein, it is usually given by a health care professional in a hospital or clinic setting. If you get this medicine at home, you will be taught how to prepare and give this medicine. Refer to the Instructions for Use that come with your medication packaging. Use exactly as directed. Take your medicine at regular intervals. Do not take your medicine more often than directed. It is important that you put your used needles and syringes in a special sharps container. Do not put them in a trash can. If you do not have a sharps container, call your pharmacist  or healthcare provider to get one. Talk to your pediatrician regarding the use of this medicine in children. While this drug may be prescribed for children as young as 7 months for selected conditions, precautions do apply. Overdosage: If you think you have taken too much of this medicine contact a poison control center or emergency room at once. NOTE: This medicine is only for you. Do not share this medicine with others. What if I miss a dose? It is important not to miss your dose. Call your doctor or health care professional if you miss a dose. What may interact with this medication? This medicine may interact with the following medications: medicines that may cause a release of neutrophils, such as lithium This list may not describe all possible interactions. Give your health care provider a list of all the medicines, herbs, non-prescription drugs, or dietary supplements you use. Also tell them if you smoke, drink alcohol, or use illegal drugs. Some items may interact with your medicine. What should I watch for while using this medication? Your condition will be monitored carefully while you are receiving this medicine. You may need blood work done while you are taking this medicine. Talk to your health care provider about your risk of cancer. You may be more at risk for certain types of cancer if you take this medicine. What side effects may I notice from receiving this medication? Side effects that you should report to your doctor or health care professional as soon as possible: allergic reactions like skin rash, itching or hives, swelling of the face, lips, or tongue back pain dizziness or feeling faint fever pain, redness, or   irritation at site where injected pinpoint red spots on the skin shortness of breath or breathing problems signs and symptoms of kidney injury like trouble passing urine, change in the amount of urine, or red or dark-brown urine stomach or side pain, or pain at  the shoulder swelling tiredness unusual bleeding or bruising Side effects that usually do not require medical attention (report to your doctor or health care professional if they continue or are bothersome): bone pain cough diarrhea hair loss headache muscle pain This list may not describe all possible side effects. Call your doctor for medical advice about side effects. You may report side effects to FDA at 1-800-FDA-1088. Where should I keep my medication? Keep out of the reach of children. Store in a refrigerator between 2 and 8 degrees C (36 and 46 degrees F). Do not freeze. Keep in carton to protect from light. Throw away this medicine if vials or syringes are left out of the refrigerator for more than 24 hours. Throw away any unused medicine after the expiration date. NOTE: This sheet is a summary. It may not cover all possible information. If you have questions about this medicine, talk to your doctor, pharmacist, or health care provider.  2022 Elsevier/Gold Standard (2019-03-14 18:47:55)  

## 2020-11-16 LAB — CYTOLOGY - NON PAP
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
## 2020-11-17 ENCOUNTER — Ambulatory Visit: Payer: PPO | Admitting: Internal Medicine

## 2020-11-18 ENCOUNTER — Encounter: Payer: Self-pay | Admitting: Physician Assistant

## 2020-11-18 ENCOUNTER — Other Ambulatory Visit: Payer: Self-pay

## 2020-11-18 ENCOUNTER — Telehealth: Payer: Self-pay | Admitting: Physician Assistant

## 2020-11-18 ENCOUNTER — Inpatient Hospital Stay: Payer: PPO

## 2020-11-18 ENCOUNTER — Inpatient Hospital Stay: Payer: PPO | Admitting: Physician Assistant

## 2020-11-18 VITALS — BP 109/52 | HR 71 | Temp 97.6°F | Resp 18 | Wt 232.1 lb

## 2020-11-18 DIAGNOSIS — C3491 Malignant neoplasm of unspecified part of right bronchus or lung: Secondary | ICD-10-CM

## 2020-11-18 DIAGNOSIS — D649 Anemia, unspecified: Secondary | ICD-10-CM

## 2020-11-18 DIAGNOSIS — Z5111 Encounter for antineoplastic chemotherapy: Secondary | ICD-10-CM

## 2020-11-18 DIAGNOSIS — K921 Melena: Secondary | ICD-10-CM | POA: Diagnosis not present

## 2020-11-18 DIAGNOSIS — Z5112 Encounter for antineoplastic immunotherapy: Secondary | ICD-10-CM | POA: Diagnosis not present

## 2020-11-18 DIAGNOSIS — R300 Dysuria: Secondary | ICD-10-CM

## 2020-11-18 DIAGNOSIS — E860 Dehydration: Secondary | ICD-10-CM

## 2020-11-18 LAB — TSH: TSH: 0.805 u[IU]/mL (ref 0.320–4.118)

## 2020-11-18 LAB — URINALYSIS, COMPLETE (UACMP) WITH MICROSCOPIC
Bilirubin Urine: NEGATIVE
Glucose, UA: NEGATIVE mg/dL
Hgb urine dipstick: NEGATIVE
Ketones, ur: NEGATIVE mg/dL
Leukocytes,Ua: NEGATIVE
Nitrite: NEGATIVE
Protein, ur: NEGATIVE mg/dL
Specific Gravity, Urine: 1.03 (ref 1.005–1.030)
pH: 6 (ref 5.0–8.0)

## 2020-11-18 LAB — CMP (CANCER CENTER ONLY)
ALT: 21 U/L (ref 0–44)
AST: 28 U/L (ref 15–41)
Albumin: 3.1 g/dL — ABNORMAL LOW (ref 3.5–5.0)
Alkaline Phosphatase: 74 U/L (ref 38–126)
Anion gap: 13 (ref 5–15)
BUN: 19 mg/dL (ref 8–23)
CO2: 22 mmol/L (ref 22–32)
Calcium: 9.1 mg/dL (ref 8.9–10.3)
Chloride: 104 mmol/L (ref 98–111)
Creatinine: 1.39 mg/dL — ABNORMAL HIGH (ref 0.61–1.24)
GFR, Estimated: 57 mL/min — ABNORMAL LOW (ref >60)
Glucose, Bld: 132 mg/dL — ABNORMAL HIGH (ref 70–99)
Potassium: 3.4 mmol/L — ABNORMAL LOW (ref 3.5–5.1)
Sodium: 139 mmol/L (ref 135–145)
Total Bilirubin: 0.4 mg/dL (ref 0.3–1.2)
Total Protein: 7 g/dL (ref 6.5–8.1)

## 2020-11-18 LAB — CBC WITH DIFFERENTIAL (CANCER CENTER ONLY)
Abs Immature Granulocytes: 0.2 10*3/uL — ABNORMAL HIGH (ref 0.00–0.07)
Basophils Absolute: 0 10*3/uL (ref 0.0–0.1)
Basophils Relative: 0 %
Eosinophils Absolute: 0 10*3/uL (ref 0.0–0.5)
Eosinophils Relative: 0 %
HCT: 26.4 % — ABNORMAL LOW (ref 39.0–52.0)
Hemoglobin: 8.7 g/dL — ABNORMAL LOW (ref 13.0–17.0)
Immature Granulocytes: 3 %
Lymphocytes Relative: 15 %
Lymphs Abs: 1.1 10*3/uL (ref 0.7–4.0)
MCH: 30.5 pg (ref 26.0–34.0)
MCHC: 33 g/dL (ref 30.0–36.0)
MCV: 92.6 fL (ref 80.0–100.0)
Monocytes Absolute: 1 10*3/uL (ref 0.1–1.0)
Monocytes Relative: 13 %
Neutro Abs: 5.3 10*3/uL (ref 1.7–7.7)
Neutrophils Relative %: 69 %
Platelet Count: 447 10*3/uL — ABNORMAL HIGH (ref 150–400)
RBC: 2.85 MIL/uL — ABNORMAL LOW (ref 4.22–5.81)
RDW: 14.3 % (ref 11.5–15.5)
WBC Count: 7.6 10*3/uL (ref 4.0–10.5)
nRBC: 0 % (ref 0.0–0.2)

## 2020-11-18 LAB — IRON AND TIBC
Iron: 36 ug/dL — ABNORMAL LOW (ref 42–163)
Saturation Ratios: 18 % — ABNORMAL LOW (ref 20–55)
TIBC: 195 ug/dL — ABNORMAL LOW (ref 202–409)
UIBC: 159 ug/dL (ref 117–376)

## 2020-11-18 LAB — FERRITIN: Ferritin: 1239 ng/mL — ABNORMAL HIGH (ref 24–336)

## 2020-11-18 MED ORDER — FOSAPREPITANT DIMEGLUMINE INJECTION 150 MG
150.0000 mg | Freq: Once | INTRAVENOUS | Status: AC
  Administered 2020-11-18: 150 mg via INTRAVENOUS
  Filled 2020-11-18: qty 150

## 2020-11-18 MED ORDER — SODIUM CHLORIDE 0.9 % IV SOLN
400.0000 mg/m2 | Freq: Once | INTRAVENOUS | Status: AC
  Administered 2020-11-18: 900 mg via INTRAVENOUS
  Filled 2020-11-18: qty 20

## 2020-11-18 MED ORDER — PALONOSETRON HCL INJECTION 0.25 MG/5ML
0.2500 mg | Freq: Once | INTRAVENOUS | Status: AC
  Administered 2020-11-18: 0.25 mg via INTRAVENOUS
  Filled 2020-11-18: qty 5

## 2020-11-18 MED ORDER — SODIUM CHLORIDE 0.9 % IV SOLN
10.0000 mg | Freq: Once | INTRAVENOUS | Status: AC
  Administered 2020-11-18: 10 mg via INTRAVENOUS
  Filled 2020-11-18: qty 10

## 2020-11-18 MED ORDER — SODIUM CHLORIDE 0.9 % IV SOLN
434.0000 mg | Freq: Once | INTRAVENOUS | Status: AC
  Administered 2020-11-18: 430 mg via INTRAVENOUS
  Filled 2020-11-18: qty 43

## 2020-11-18 MED ORDER — SULFAMETHOXAZOLE-TRIMETHOPRIM 800-160 MG PO TABS: 1.0000 | ORAL_TABLET | Freq: Two times a day (BID) | ORAL | 0 refills | Status: DC

## 2020-11-18 MED ORDER — SODIUM CHLORIDE 0.9 % IV SOLN: Freq: Once | INTRAVENOUS | Status: AC

## 2020-11-18 MED ORDER — SODIUM CHLORIDE 0.9 % IV SOLN
200.0000 mg | Freq: Once | INTRAVENOUS | Status: AC
  Administered 2020-11-18: 200 mg via INTRAVENOUS
  Filled 2020-11-18: qty 8

## 2020-11-18 NOTE — Patient Instructions (Signed)
[facility] ONCOLOGY  Discharge Instructions: Thank you for choosing [facility] to provide your oncology and hematology care.   If you have a lab appointment with the [facility], please go directly to the [facility] and check in at the registration area.   Wear comfortable clothing and clothing appropriate for easy access to any Portacath or PICC line.   We strive to give you quality time with your provider. You may need to reschedule your appointment if you arrive late (15 or more minutes).  Arriving late affects you and other patients whose appointments are after yours.  Also, if you miss three or more appointments without notifying the office, you may be dismissed from the clinic at the provider's discretion.      For prescription refill requests, have your pharmacy contact our office and allow 72 hours for refills to be completed.    Today you received the following chemotherapy and/or immunotherapy agents Pembrolizumab, Pemetrexed, 1L NS Bolus, and Carboplatin.      To help prevent nausea and vomiting after your treatment, we encourage you to take your nausea medication as directed.  BELOW ARE SYMPTOMS THAT SHOULD BE REPORTED IMMEDIATELY: *FEVER GREATER THAN 100.4 F (38 C) OR HIGHER *CHILLS OR SWEATING *NAUSEA AND VOMITING THAT IS NOT CONTROLLED WITH YOUR NAUSEA MEDICATION *UNUSUAL SHORTNESS OF BREATH *UNUSUAL BRUISING OR BLEEDING *URINARY PROBLEMS (pain or burning when urinating, or frequent urination) *BOWEL PROBLEMS (unusual diarrhea, constipation, pain near the anus) TENDERNESS IN MOUTH AND THROAT WITH OR WITHOUT PRESENCE OF ULCERS (sore throat, sores in mouth, or a toothache) UNUSUAL RASH, SWELLING OR PAIN  UNUSUAL VAGINAL DISCHARGE OR ITCHING   Items with * indicate a potential emergency and should be followed up as soon as possible or go to the Emergency Department if any problems should occur.  Please show the CHEMOTHERAPY ALERT  CARD or IMMUNOTHERAPY ALERT CARD at check-in to the Emergency Department and triage nurse.  Should you have questions after your visit or need to cancel or reschedule your appointment, please contact [facility]  Dept: [phone]  and follow the prompts.  Office hours are 8:00 a.m. to 4:30 p.m. Monday - Friday. Please note that voicemails left after 4:00 p.m. may not be returned until the following business day.  We are closed weekends and major holidays. You have access to a nurse at all times for urgent questions. Please call the main number to the clinic Dept: [phone] and follow the prompts.   For any non-urgent questions, you may also contact your provider using MyChart. We now offer e-Visits for anyone 41 and older to request care online for non-urgent symptoms. For details visit [email or web address].   Also download the MyChart app! Go to the app store, search "MyChart", open the app, select [facility], and log in with your MyChart username and password.  Due to Covid, a mask is required upon entering the hospital/clinic. If you do not have a mask, one will be given to you upon arrival. For doctor visits, patients may have 1 support person aged 61 or older with them. For treatment visits, patients cannot have anyone with them due to current Covid guidelines and our immunocompromised population.

## 2020-11-18 NOTE — Telephone Encounter (Signed)
The patient was complaining of dysuria, malodorous urine, hematuria, and cloudy urine today. UA performed which did not show clear signs of infection but we will treat him empirically with bactrim. I let his sister know I sent a prescription to the pharmacy. If he develops new or worsening symptoms he was advised to let us know. I will let him know the status of his urine culture. In the meantime, his concentrated urine and elevated creatinine suggests he is a little dehydrated. He received 1 L of fluid today and he was strongly encouraged to increase his fluid intake.

## 2020-11-18 NOTE — Telephone Encounter (Signed)
His iron studies is suggestive of anemia of chronic disease. His iron is low. Spoke to his sister who encouraged him to take an iron supplement daily.

## 2020-11-19 ENCOUNTER — Encounter: Payer: Self-pay | Admitting: Internal Medicine

## 2020-11-19 DIAGNOSIS — Z5112 Encounter for antineoplastic immunotherapy: Secondary | ICD-10-CM | POA: Diagnosis not present

## 2020-11-19 LAB — URINE CULTURE: Culture: <10000 — AB

## 2020-11-20 ENCOUNTER — Other Ambulatory Visit: Payer: Self-pay

## 2020-11-20 ENCOUNTER — Inpatient Hospital Stay: Payer: PPO

## 2020-11-20 DIAGNOSIS — D649 Anemia, unspecified: Secondary | ICD-10-CM

## 2020-11-20 DIAGNOSIS — Z5112 Encounter for antineoplastic immunotherapy: Secondary | ICD-10-CM | POA: Diagnosis not present

## 2020-11-20 LAB — OCCULT BLOOD X 1 CARD TO LAB, STOOL
Fecal Occult Bld: NEGATIVE
Fecal Occult Bld: NEGATIVE
Fecal Occult Bld: NEGATIVE

## 2020-11-25 ENCOUNTER — Inpatient Hospital Stay: Payer: PPO

## 2020-11-25 ENCOUNTER — Other Ambulatory Visit: Payer: Self-pay

## 2020-11-25 DIAGNOSIS — C3491 Malignant neoplasm of unspecified part of right bronchus or lung: Secondary | ICD-10-CM

## 2020-11-25 DIAGNOSIS — D649 Anemia, unspecified: Secondary | ICD-10-CM

## 2020-11-25 DIAGNOSIS — Z5112 Encounter for antineoplastic immunotherapy: Secondary | ICD-10-CM | POA: Diagnosis not present

## 2020-11-25 LAB — CMP (CANCER CENTER ONLY)
ALT: 28 U/L (ref 0–44)
AST: 17 U/L (ref 15–41)
Albumin: 3.2 g/dL — ABNORMAL LOW (ref 3.5–5.0)
Alkaline Phosphatase: 64 U/L (ref 38–126)
Anion gap: 11 (ref 5–15)
BUN: 16 mg/dL (ref 8–23)
CO2: 23 mmol/L (ref 22–32)
Calcium: 9.2 mg/dL (ref 8.9–10.3)
Chloride: 104 mmol/L (ref 98–111)
Creatinine: 1.57 mg/dL — ABNORMAL HIGH (ref 0.61–1.24)
GFR, Estimated: 49 mL/min — ABNORMAL LOW (ref >60)
Glucose, Bld: 171 mg/dL — ABNORMAL HIGH (ref 70–99)
Potassium: 4.3 mmol/L (ref 3.5–5.1)
Sodium: 138 mmol/L (ref 135–145)
Total Bilirubin: 0.8 mg/dL (ref 0.3–1.2)
Total Protein: 7 g/dL (ref 6.5–8.1)

## 2020-11-25 LAB — CBC WITH DIFFERENTIAL (CANCER CENTER ONLY)
Abs Immature Granulocytes: 0.02 10*3/uL (ref 0.00–0.07)
Basophils Absolute: 0 10*3/uL (ref 0.0–0.1)
Basophils Relative: 1 %
Eosinophils Absolute: 0.1 10*3/uL (ref 0.0–0.5)
Eosinophils Relative: 3 %
HCT: 25.7 % — ABNORMAL LOW (ref 39.0–52.0)
Hemoglobin: 8.2 g/dL — ABNORMAL LOW (ref 13.0–17.0)
Immature Granulocytes: 1 %
Lymphocytes Relative: 35 %
Lymphs Abs: 0.5 10*3/uL — ABNORMAL LOW (ref 0.7–4.0)
MCH: 30 pg (ref 26.0–34.0)
MCHC: 31.9 g/dL (ref 30.0–36.0)
MCV: 94.1 fL (ref 80.0–100.0)
Monocytes Absolute: 0.1 10*3/uL (ref 0.1–1.0)
Monocytes Relative: 3 %
Neutro Abs: 0.8 10*3/uL — ABNORMAL LOW (ref 1.7–7.7)
Neutrophils Relative %: 57 %
Platelet Count: 312 10*3/uL (ref 150–400)
RBC: 2.73 MIL/uL — ABNORMAL LOW (ref 4.22–5.81)
RDW: 14.1 % (ref 11.5–15.5)
WBC Count: 1.5 10*3/uL — ABNORMAL LOW (ref 4.0–10.5)
nRBC: 0 % (ref 0.0–0.2)

## 2020-11-25 LAB — SAMPLE TO BLOOD BANK

## 2020-11-26 DIAGNOSIS — C3490 Malignant neoplasm of unspecified part of unspecified bronchus or lung: Secondary | ICD-10-CM | POA: Diagnosis not present

## 2020-11-26 DIAGNOSIS — Z23 Encounter for immunization: Secondary | ICD-10-CM | POA: Diagnosis not present

## 2020-11-26 DIAGNOSIS — E7439 Other disorders of intestinal carbohydrate absorption: Secondary | ICD-10-CM | POA: Diagnosis not present

## 2020-11-26 DIAGNOSIS — I10 Essential (primary) hypertension: Secondary | ICD-10-CM | POA: Diagnosis not present

## 2020-11-26 DIAGNOSIS — J432 Centrilobular emphysema: Secondary | ICD-10-CM | POA: Diagnosis not present

## 2020-12-02 ENCOUNTER — Other Ambulatory Visit: Payer: Self-pay | Admitting: Physician Assistant

## 2020-12-02 ENCOUNTER — Telehealth: Payer: Self-pay

## 2020-12-02 ENCOUNTER — Inpatient Hospital Stay: Payer: PPO

## 2020-12-02 ENCOUNTER — Other Ambulatory Visit: Payer: Self-pay

## 2020-12-02 DIAGNOSIS — C3491 Malignant neoplasm of unspecified part of right bronchus or lung: Secondary | ICD-10-CM

## 2020-12-02 DIAGNOSIS — D649 Anemia, unspecified: Secondary | ICD-10-CM

## 2020-12-02 DIAGNOSIS — Z5112 Encounter for antineoplastic immunotherapy: Secondary | ICD-10-CM | POA: Diagnosis not present

## 2020-12-02 LAB — CBC WITH DIFFERENTIAL (CANCER CENTER ONLY)
Abs Immature Granulocytes: 0 10*3/uL (ref 0.00–0.07)
Basophils Absolute: 0 10*3/uL (ref 0.0–0.1)
Basophils Relative: 0 %
Eosinophils Absolute: 0.1 10*3/uL (ref 0.0–0.5)
Eosinophils Relative: 5 %
HCT: 22 % — ABNORMAL LOW (ref 39.0–52.0)
Hemoglobin: 7.1 g/dL — ABNORMAL LOW (ref 13.0–17.0)
Immature Granulocytes: 0 %
Lymphocytes Relative: 29 %
Lymphs Abs: 0.6 10*3/uL — ABNORMAL LOW (ref 0.7–4.0)
MCH: 30.1 pg (ref 26.0–34.0)
MCHC: 32.3 g/dL (ref 30.0–36.0)
MCV: 93.2 fL (ref 80.0–100.0)
Monocytes Absolute: 0.6 10*3/uL (ref 0.1–1.0)
Monocytes Relative: 29 %
Neutro Abs: 0.8 10*3/uL — ABNORMAL LOW (ref 1.7–7.7)
Neutrophils Relative %: 37 %
Platelet Count: 148 10*3/uL — ABNORMAL LOW (ref 150–400)
RBC: 2.36 MIL/uL — ABNORMAL LOW (ref 4.22–5.81)
RDW: 14.6 % (ref 11.5–15.5)
WBC Count: 2.2 10*3/uL — ABNORMAL LOW (ref 4.0–10.5)
nRBC: 0 % (ref 0.0–0.2)

## 2020-12-02 LAB — CMP (CANCER CENTER ONLY)
ALT: 28 U/L (ref 0–44)
AST: 23 U/L (ref 15–41)
Albumin: 3.4 g/dL — ABNORMAL LOW (ref 3.5–5.0)
Alkaline Phosphatase: 68 U/L (ref 38–126)
Anion gap: 12 (ref 5–15)
BUN: 17 mg/dL (ref 8–23)
CO2: 23 mmol/L (ref 22–32)
Calcium: 9.6 mg/dL (ref 8.9–10.3)
Chloride: 104 mmol/L (ref 98–111)
Creatinine: 1.38 mg/dL — ABNORMAL HIGH (ref 0.61–1.24)
GFR, Estimated: 57 mL/min — ABNORMAL LOW (ref >60)
Glucose, Bld: 120 mg/dL — ABNORMAL HIGH (ref 70–99)
Potassium: 3.8 mmol/L (ref 3.5–5.1)
Sodium: 139 mmol/L (ref 135–145)
Total Bilirubin: 0.9 mg/dL (ref 0.3–1.2)
Total Protein: 7.6 g/dL (ref 6.5–8.1)

## 2020-12-02 LAB — SAMPLE TO BLOOD BANK

## 2020-12-02 LAB — PREPARE RBC (CROSSMATCH)

## 2020-12-02 NOTE — Telephone Encounter (Signed)
Pts sister has been advised of pts transfusion tomorrow 12/03/20 at our [place] location. I have provided her with the address and advised to go into the infusion with the pt. She expressed understanding of this information.

## 2020-12-03 ENCOUNTER — Inpatient Hospital Stay: Payer: PPO

## 2020-12-03 DIAGNOSIS — D649 Anemia, unspecified: Secondary | ICD-10-CM

## 2020-12-03 DIAGNOSIS — Z5112 Encounter for antineoplastic immunotherapy: Secondary | ICD-10-CM | POA: Diagnosis not present

## 2020-12-03 MED ORDER — DIPHENHYDRAMINE HCL 25 MG PO CAPS
25.0000 mg | ORAL_CAPSULE | Freq: Once | ORAL | Status: AC
  Administered 2020-12-03: 25 mg via ORAL
  Filled 2020-12-03: qty 1

## 2020-12-03 MED ORDER — ACETAMINOPHEN 325 MG PO TABS
650.0000 mg | ORAL_TABLET | Freq: Once | ORAL | Status: AC
  Administered 2020-12-03: 650 mg via ORAL
  Filled 2020-12-03: qty 2

## 2020-12-03 MED ORDER — SODIUM CHLORIDE 0.9% IV SOLUTION
250.0000 mL | Freq: Once | INTRAVENOUS | Status: AC
  Administered 2020-12-03: 250 mL via INTRAVENOUS

## 2020-12-03 NOTE — Patient Instructions (Signed)
Blood Transfusion, Adult, Care After This sheet gives you information about how to care for yourself after your procedure. Your doctor may also give you more specific instructions. If you have problems or questions, contact your doctor. What can I expect after the procedure? After the procedure, it is common to have: Bruising and soreness at the IV site. A headache. Follow these instructions at home: Insertion site care   Follow instructions from your doctor about how to take care of your insertion site. This is where an IV tube was put into your vein. Make sure you: Wash your hands with soap and water before and after you change your bandage (dressing). If you cannot use soap and water, use hand sanitizer. Change your bandage as told by your doctor. Check your insertion site every day for signs of infection. Check for: Redness, swelling, or pain. Bleeding from the site. Warmth. Pus or a bad smell. General instructions Take over-the-counter and prescription medicines only as told by your doctor. Rest as told by your doctor. Go back to your normal activities as told by your doctor. Keep all follow-up visits as told by your doctor. This is important. Contact a doctor if: You have itching or red, swollen areas of skin (hives). You feel worried or nervous (anxious). You feel weak after doing your normal activities. You have redness, swelling, warmth, or pain around the insertion site. You have blood coming from the insertion site, and the blood does not stop with pressure. You have pus or a bad smell coming from the insertion site. Get help right away if: You have signs of a serious reaction. This may be coming from an allergy or the body's defense system (immune system). Signs include: Trouble breathing or shortness of breath. Swelling of the face or feeling warm (flushed). Fever or chills. Head, chest, or back pain. Dark pee (urine) or blood in the pee. Widespread rash. Fast  heartbeat. Feeling dizzy or light-headed. You may receive your blood transfusion in an outpatient setting. If so, you will be told whom to contact to report any reactions. These symptoms may be an emergency. Do not wait to see if the symptoms will go away. Get medical help right away. Call your local emergency services (911 in the [place] Do not drive yourself to the hospital. Summary Bruising and soreness at the IV site are common. Check your insertion site every day for signs of infection. Rest as told by your doctor. Go back to your normal activities as told by your doctor. Get help right away if you have signs of a serious reaction. This information is not intended to replace advice given to you by your health care provider. Make sure you discuss any questions you have with your health care provider. Document Revised: 06/18/2020 Document Reviewed: 08/16/2018 Elsevier Patient Education  2022 [employer]  

## 2020-12-04 ENCOUNTER — Ambulatory Visit (HOSPITAL_COMMUNITY)
Admission: RE | Admit: 2020-12-04 | Discharge: 2020-12-04 | Disposition: A | Discharge status: Home / Self Care | Payer: PPO | Source: Ambulatory Visit | Attending: Physician Assistant | Admitting: Physician Assistant

## 2020-12-04 ENCOUNTER — Other Ambulatory Visit: Payer: Self-pay

## 2020-12-04 DIAGNOSIS — K8050 Calculus of bile duct without cholangitis or cholecystitis without obstruction: Secondary | ICD-10-CM | POA: Diagnosis not present

## 2020-12-04 DIAGNOSIS — J439 Emphysema, unspecified: Secondary | ICD-10-CM | POA: Diagnosis not present

## 2020-12-04 DIAGNOSIS — I700 Atherosclerosis of aorta: Secondary | ICD-10-CM | POA: Diagnosis not present

## 2020-12-04 DIAGNOSIS — C3491 Malignant neoplasm of unspecified part of right bronchus or lung: Secondary | ICD-10-CM | POA: Diagnosis not present

## 2020-12-04 DIAGNOSIS — R911 Solitary pulmonary nodule: Secondary | ICD-10-CM | POA: Diagnosis not present

## 2020-12-04 DIAGNOSIS — C3490 Malignant neoplasm of unspecified part of unspecified bronchus or lung: Secondary | ICD-10-CM | POA: Diagnosis not present

## 2020-12-04 LAB — TYPE AND SCREEN
ABO/RH(D): O NEG
Antibody Screen: NEGATIVE
Unit division: 0
Unit division: 0

## 2020-12-04 LAB — BPAM RBC
Blood Product Expiration Date: 202210192359
Blood Product Expiration Date: 202210212359
ISSUE DATE / TIME: 202209290744
ISSUE DATE / TIME: 202209290744
Unit Type and Rh: 9500
Unit Type and Rh: 9500

## 2020-12-04 IMAGING — CT CT CHEST W/O CM
2 of 4 series · 12 of 36 positions shown, 15 images · non-contrast
Comparison: PET-CT [DATE].

CLINICAL DATA: Non-small-cell lung cancer.  Restaging.

EXAM:
CT CHEST, ABDOMEN AND PELVIS WITHOUT CONTRAST
TECHNIQUE: Multidetector CT imaging of the chest, abdomen and pelvis was
performed following the standard protocol without IV contrast.

[Series 2: cap w/o · axial · non-contrast · 0.90mm/px · z∈[+1223,+1758]mm · 9 of 131 slices shown, 12 images]
[im 12/131  mediastinal]
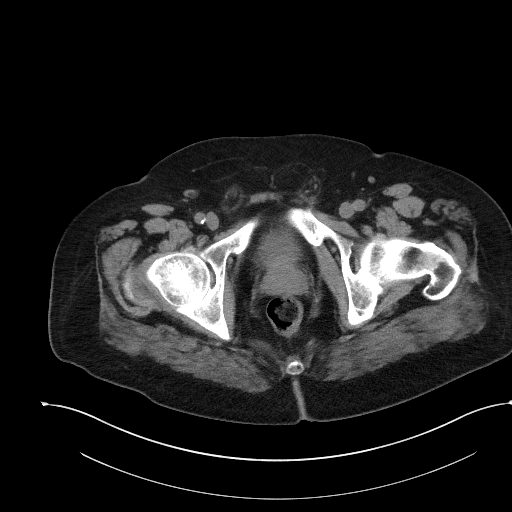
[im 12/131  lung]
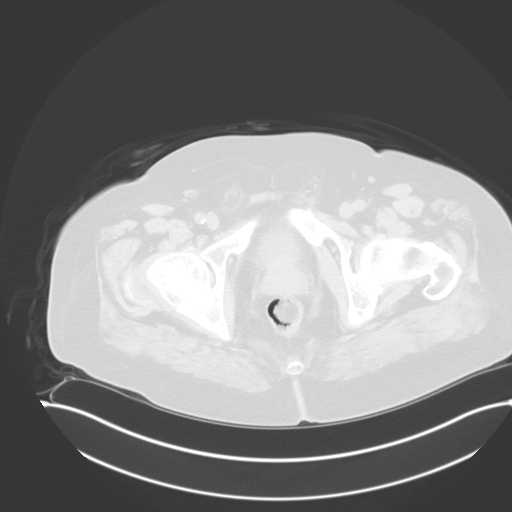
[im 24/131  lung]
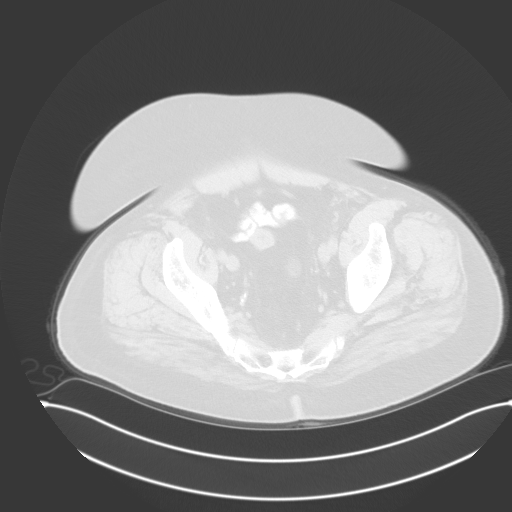
[im 36/131  lung]
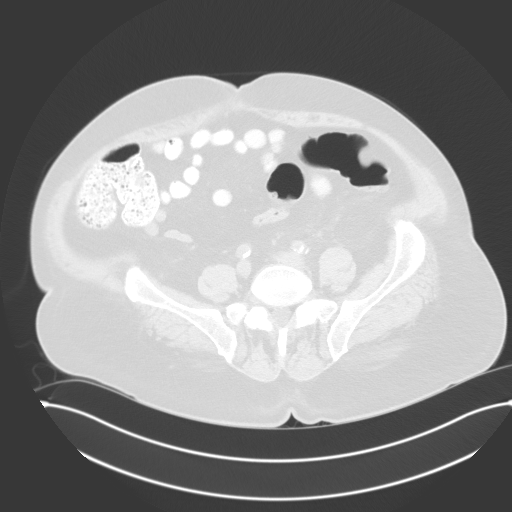
[im 48/131  lung]
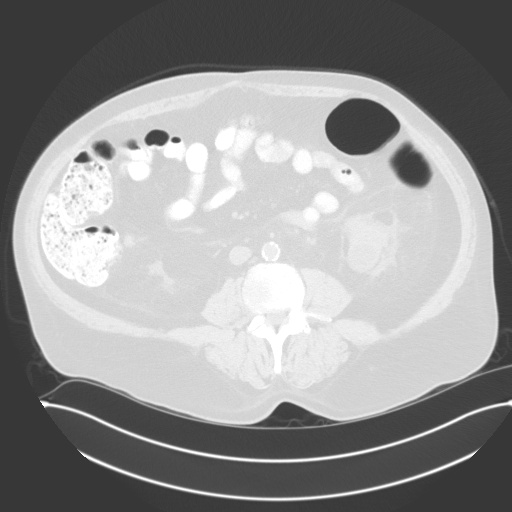
[im 71/131  mediastinal]
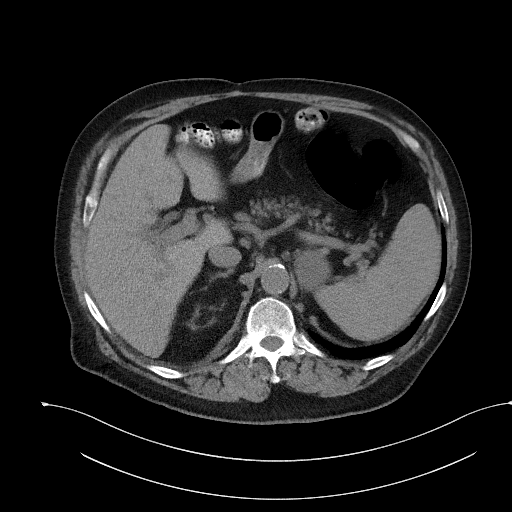
[im 71/131  lung]
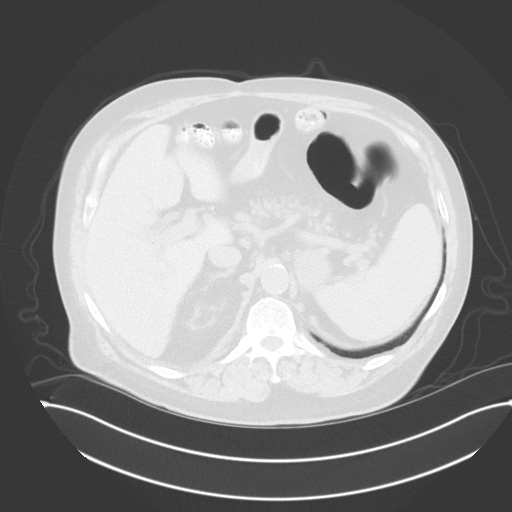
[im 83/131  lung]
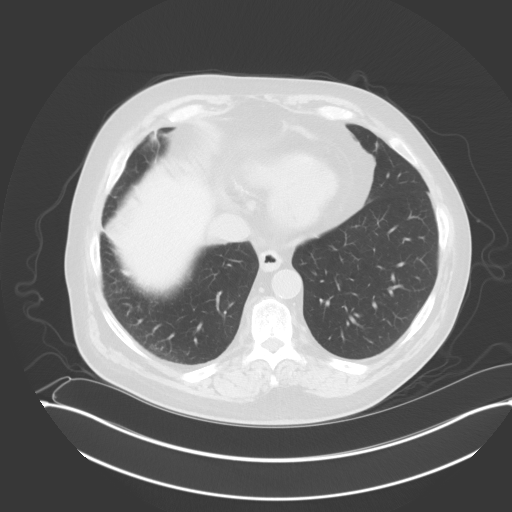
[im 95/131  lung]
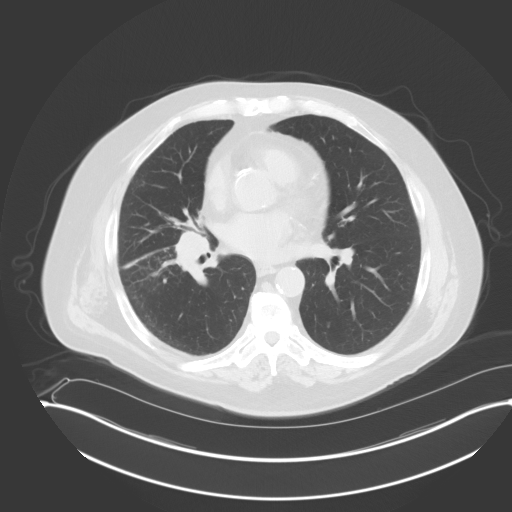
[im 107/131  lung]
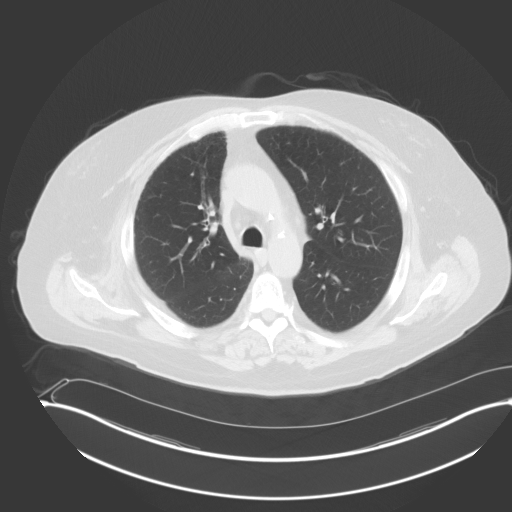
[im 119/131  mediastinal]
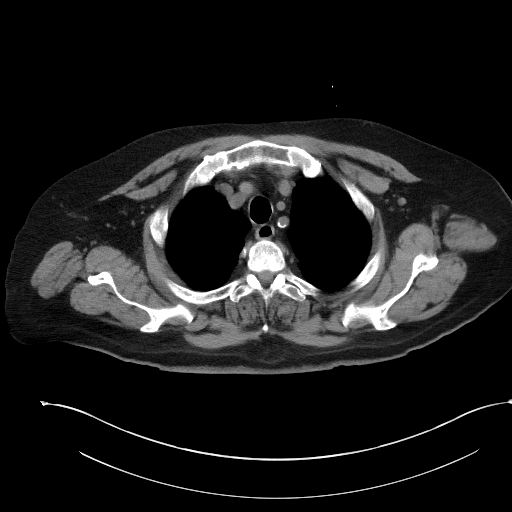
[im 119/131  lung]
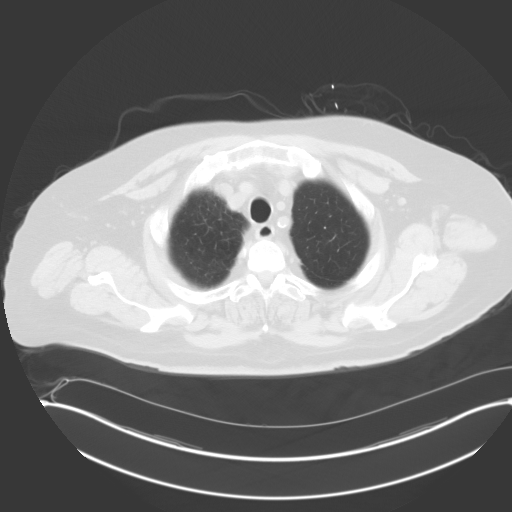

[Series 5: coronals · coronal · 0.82mm/px · 3 of 165 slices shown]
[im 33/165  lung]
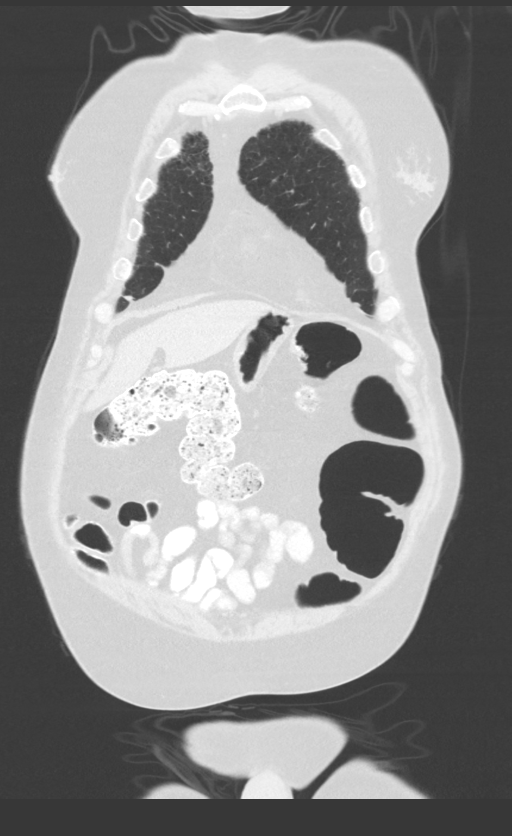
[im 66/165  lung]
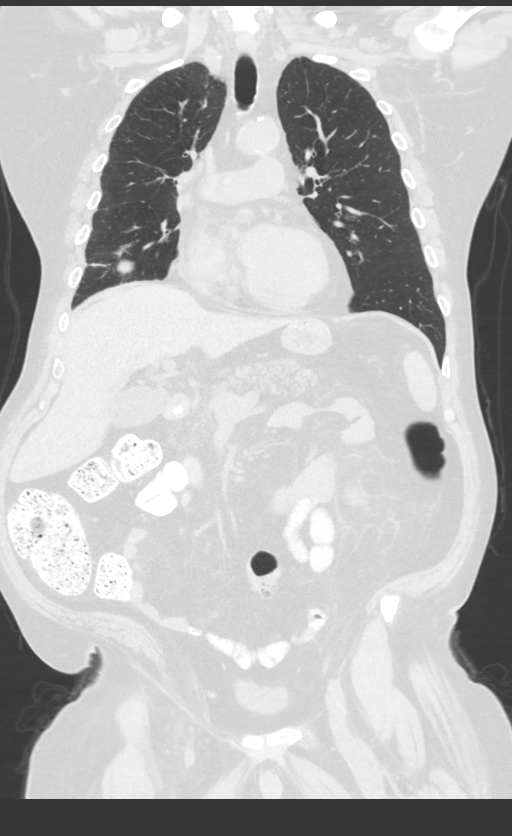
[im 99/165  lung]
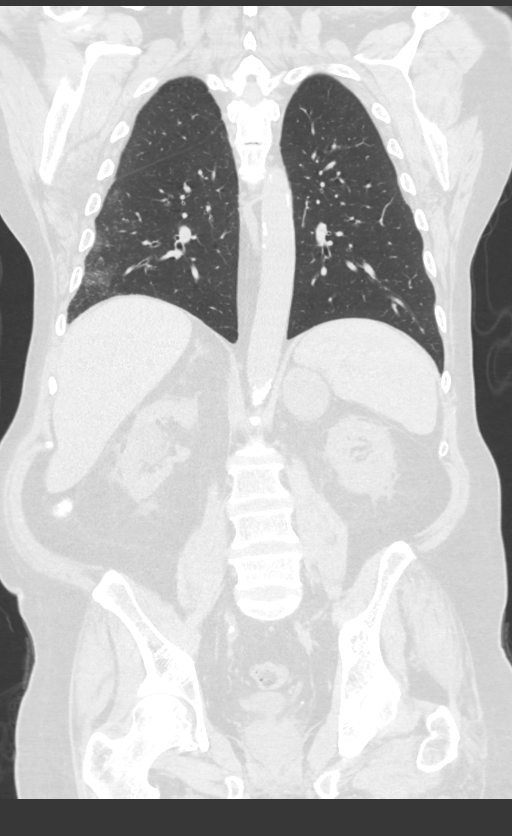

[12 of 36 positions shown; findings below may reference images not displayed]

FINDINGS: CT CHEST FINDINGS

Cardiovascular: The heart size is normal. No substantial pericardial
effusion. Coronary artery calcification is evident. Mild
atherosclerotic calcification is noted in the wall of the thoracic
aorta.

Mediastinum/Nodes: 2.3 cm subcarinal node measured previously is
cm today on 33/2.

Right hilar node measured previously at 2.6 cm is now 2.1 cm on
38/2.

No left hilar lymphadenopathy although assessment is limited by the
lack of intravenous contrast on the current study. The esophagus has
normal imaging features. There is no axillary lymphadenopathy.

Lungs/Pleura: Centrilobular emphsyema noted. Dominant mass lesion in
the lower right lung, prostate the major fissure has decreased in
size in the interval measuring 4.0 x 3.8 cm today (image 111/4)
compared to 5.3 x 4.6 cm previously. This lesion is now cavitary.
Surrounding interstitial and ill-defined consolidative airspace
opacity is probably secondary to radiation therapy.

8 mm subpleural right middle lobe nodule on [DATE] is stable. 7 mm
perifissural right lower lobe nodule on 83/4 was 5 mm previously.

Stable tiny left lower lobe nodule on [DATE].

No focal airspace consolidation.  No pleural effusion.

Musculoskeletal: No worrisome lytic or sclerotic osseous
abnormality.

CT ABDOMEN PELVIS FINDINGS

Hepatobiliary: No focal abnormality in the liver on this study
without intravenous contrast. Tiny layering gallstones evident. No
intrahepatic or extrahepatic biliary dilation. Tiny common bile duct
stones are evident (71/2).

Pancreas: No focal mass lesion. No dilatation of the main duct. No
intraparenchymal cyst. No peripancreatic edema.

Spleen: No splenomegaly. No focal mass lesion.

Adrenals/Urinary Tract: Right adrenal gland unremarkable. 5.4 x
cm left adrenal mass is similar to minimally decreased in the
interval from 5.6 x 4.7 cm previously. On the prior study, this
lesion was reported in the right adrenal gland but it is, in fact,
in the left adrenal gland. Small interpolar cyst noted right kidney.
Left kidney unremarkable. A cluster of tiny punctate calcification
noted either along the posterior wall of the left renal pelvis or
just posterior to the left renal pelvis (axial 78/2). No evidence
for hydroureter. The urinary bladder appears normal for the degree
of distention.

Stomach/Bowel: Stomach is unremarkable. No gastric wall thickening.
No evidence of outlet obstruction. Duodenum is normally positioned
as is the ligament of Treitz. No small bowel wall thickening. No
small bowel dilatation. The terminal ileum is normal. The appendix
is not well visualized, but there is no edema or inflammation in the
region of the cecum. No gross colonic mass. No colonic wall
thickening.

Vascular/Lymphatic: There is moderate atherosclerotic calcification
of the abdominal aorta without aneurysm. There is no gastrohepatic
or hepatoduodenal ligament lymphadenopathy. No retroperitoneal or
mesenteric lymphadenopathy. No pelvic sidewall lymphadenopathy.

Reproductive: The prostate gland and seminal vesicles are
unremarkable.

Other: No intraperitoneal free fluid.

Musculoskeletal: No worrisome lytic or sclerotic osseous
abnormality.
IMPRESSION: 1. Interval decrease in size of the dominant mass lesion in the
lower right lung, now cavitary. Surrounding interstitial and
ill-defined consolidative airspace opacity is probably secondary to
radiation therapy.
2. Interval decrease in size of mediastinal and right hilar
lymphadenopathy.
3. Minimal decrease in the 5.4 x 4.9 cm left adrenal mass. On the
prior PET study, this lesion was reported in the right adrenal gland
but it is, in fact the left adrenal gland. Imaging features remain
concerning for metastatic disease.
4. Cholelithiasis with tiny common bile duct stones. No evidence for
biliary duct dilatation.
5. Tiny cluster of tiny punctate calcification either along the
posterior wall of the left renal pelvis or just posterior to the
left renal pelvis. Close attention on follow-up recommended.
6. Aortic Atherosclerosis ([14]-[14]) and Emphysema ([14]-[14]).

## 2020-12-09 ENCOUNTER — Inpatient Hospital Stay: Payer: PPO | Attending: Physician Assistant | Admitting: Internal Medicine

## 2020-12-09 ENCOUNTER — Inpatient Hospital Stay: Payer: PPO

## 2020-12-09 ENCOUNTER — Encounter: Payer: Self-pay | Admitting: Internal Medicine

## 2020-12-09 ENCOUNTER — Other Ambulatory Visit: Payer: Self-pay

## 2020-12-09 ENCOUNTER — Other Ambulatory Visit: Payer: Self-pay | Admitting: Physician Assistant

## 2020-12-09 VITALS — BP 121/77 | HR 80 | Temp 97.9°F | Resp 20 | Ht 68.0 in | Wt 226.4 lb

## 2020-12-09 DIAGNOSIS — C3491 Malignant neoplasm of unspecified part of right bronchus or lung: Secondary | ICD-10-CM

## 2020-12-09 DIAGNOSIS — Z5111 Encounter for antineoplastic chemotherapy: Secondary | ICD-10-CM | POA: Diagnosis not present

## 2020-12-09 DIAGNOSIS — Z5112 Encounter for antineoplastic immunotherapy: Secondary | ICD-10-CM | POA: Diagnosis not present

## 2020-12-09 DIAGNOSIS — C7972 Secondary malignant neoplasm of left adrenal gland: Secondary | ICD-10-CM | POA: Diagnosis not present

## 2020-12-09 DIAGNOSIS — C3431 Malignant neoplasm of lower lobe, right bronchus or lung: Secondary | ICD-10-CM | POA: Diagnosis not present

## 2020-12-09 DIAGNOSIS — K5900 Constipation, unspecified: Secondary | ICD-10-CM | POA: Diagnosis not present

## 2020-12-09 DIAGNOSIS — D649 Anemia, unspecified: Secondary | ICD-10-CM

## 2020-12-09 DIAGNOSIS — Z5189 Encounter for other specified aftercare: Secondary | ICD-10-CM | POA: Diagnosis not present

## 2020-12-09 DIAGNOSIS — R52 Pain, unspecified: Secondary | ICD-10-CM | POA: Insufficient documentation

## 2020-12-09 LAB — CBC WITH DIFFERENTIAL (CANCER CENTER ONLY)
Abs Immature Granulocytes: 0.05 10*3/uL (ref 0.00–0.07)
Basophils Absolute: 0 10*3/uL (ref 0.0–0.1)
Basophils Relative: 1 %
Eosinophils Absolute: 0.1 10*3/uL (ref 0.0–0.5)
Eosinophils Relative: 2 %
HCT: 30.6 % — ABNORMAL LOW (ref 39.0–52.0)
Hemoglobin: 9.8 g/dL — ABNORMAL LOW (ref 13.0–17.0)
Immature Granulocytes: 1 %
Lymphocytes Relative: 30 %
Lymphs Abs: 1.3 10*3/uL (ref 0.7–4.0)
MCH: 30.5 pg (ref 26.0–34.0)
MCHC: 32 g/dL (ref 30.0–36.0)
MCV: 95.3 fL (ref 80.0–100.0)
Monocytes Absolute: 0.7 10*3/uL (ref 0.1–1.0)
Monocytes Relative: 17 %
Neutro Abs: 2.1 10*3/uL (ref 1.7–7.7)
Neutrophils Relative %: 49 %
Platelet Count: 452 10*3/uL — ABNORMAL HIGH (ref 150–400)
RBC: 3.21 MIL/uL — ABNORMAL LOW (ref 4.22–5.81)
RDW: 16.5 % — ABNORMAL HIGH (ref 11.5–15.5)
WBC Count: 4.2 10*3/uL (ref 4.0–10.5)
nRBC: 0 % (ref 0.0–0.2)

## 2020-12-09 LAB — CMP (CANCER CENTER ONLY)
ALT: 19 U/L (ref 0–44)
AST: 26 U/L (ref 15–41)
Albumin: 3.3 g/dL — ABNORMAL LOW (ref 3.5–5.0)
Alkaline Phosphatase: 68 U/L (ref 38–126)
Anion gap: 11 (ref 5–15)
BUN: 10 mg/dL (ref 8–23)
CO2: 25 mmol/L (ref 22–32)
Calcium: 9.2 mg/dL (ref 8.9–10.3)
Chloride: 107 mmol/L (ref 98–111)
Creatinine: 1.06 mg/dL (ref 0.61–1.24)
GFR, Estimated: >60 mL/min (ref >60)
Glucose, Bld: 103 mg/dL — ABNORMAL HIGH (ref 70–99)
Potassium: 3.7 mmol/L (ref 3.5–5.1)
Sodium: 143 mmol/L (ref 135–145)
Total Bilirubin: 0.4 mg/dL (ref 0.3–1.2)
Total Protein: 6.8 g/dL (ref 6.5–8.1)

## 2020-12-09 LAB — TSH: TSH: 2.251 u[IU]/mL (ref 0.320–4.118)

## 2020-12-09 MED ORDER — SODIUM CHLORIDE 0.9 % IV SOLN
10.0000 mg | Freq: Once | INTRAVENOUS | Status: AC
  Administered 2020-12-09: 10 mg via INTRAVENOUS
  Filled 2020-12-09: qty 10

## 2020-12-09 MED ORDER — SODIUM CHLORIDE 0.9 % IV SOLN
500.0000 mg | Freq: Once | INTRAVENOUS | Status: AC
  Administered 2020-12-09: 500 mg via INTRAVENOUS
  Filled 2020-12-09: qty 50

## 2020-12-09 MED ORDER — SODIUM CHLORIDE 0.9 % IV SOLN
400.0000 mg/m2 | Freq: Once | INTRAVENOUS | Status: AC
  Administered 2020-12-09: 900 mg via INTRAVENOUS
  Filled 2020-12-09: qty 20

## 2020-12-09 MED ORDER — ACETAMINOPHEN 325 MG PO TABS
650.0000 mg | ORAL_TABLET | Freq: Once | ORAL | Status: AC
  Administered 2020-12-09: 650 mg via ORAL
  Filled 2020-12-09: qty 2

## 2020-12-09 MED ORDER — SODIUM CHLORIDE 0.9 % IV SOLN
200.0000 mg | Freq: Once | INTRAVENOUS | Status: AC
  Administered 2020-12-09: 200 mg via INTRAVENOUS
  Filled 2020-12-09: qty 8

## 2020-12-09 MED ORDER — PALONOSETRON HCL INJECTION 0.25 MG/5ML
0.2500 mg | Freq: Once | INTRAVENOUS | Status: AC
  Administered 2020-12-09: 0.25 mg via INTRAVENOUS
  Filled 2020-12-09: qty 5

## 2020-12-09 MED ORDER — SODIUM CHLORIDE 0.9 % IV SOLN
150.0000 mg | Freq: Once | INTRAVENOUS | Status: AC
  Administered 2020-12-09: 150 mg via INTRAVENOUS
  Filled 2020-12-09: qty 150

## 2020-12-09 MED ORDER — CYANOCOBALAMIN 1000 MCG/ML IJ SOLN
1000.0000 ug | Freq: Once | INTRAMUSCULAR | Status: AC
  Administered 2020-12-09: 1000 ug via INTRAMUSCULAR
  Filled 2020-12-09: qty 1

## 2020-12-09 MED ORDER — SODIUM CHLORIDE 0.9 % IV SOLN: Freq: Once | INTRAVENOUS | Status: AC

## 2020-12-09 NOTE — Patient Instructions (Signed)
[facility]  Discharge Instructions: Thank you for choosing [facility] to provide your oncology and hematology care.   If you have a lab appointment with the Cancer Center, please go directly to the Cancer Center and check in at the registration area.   Wear comfortable clothing and clothing appropriate for easy access to any Portacath or PICC line.   We strive to give you quality time with your provider. You may need to reschedule your appointment if you arrive late (15 or more minutes).  Arriving late affects you and other patients whose appointments are after yours.  Also, if you miss three or more appointments without notifying the office, you may be dismissed from the clinic at the provider's discretion.      For prescription refill requests, have your pharmacy contact our office and allow 72 hours for refills to be completed.    Today you received the following chemotherapy and/or immunotherapy agents pembrolizumab, pemetrexed, carboplatin      To help prevent nausea and vomiting after your treatment, we encourage you to take your nausea medication as directed.  BELOW ARE SYMPTOMS THAT SHOULD BE REPORTED IMMEDIATELY: *FEVER GREATER THAN 100.4 F (38 C) OR HIGHER *CHILLS OR SWEATING *NAUSEA AND VOMITING THAT IS NOT CONTROLLED WITH YOUR NAUSEA MEDICATION *UNUSUAL SHORTNESS OF BREATH *UNUSUAL BRUISING OR BLEEDING *URINARY PROBLEMS (pain or burning when urinating, or frequent urination) *BOWEL PROBLEMS (unusual diarrhea, constipation, pain near the anus) TENDERNESS IN MOUTH AND THROAT WITH OR WITHOUT PRESENCE OF ULCERS (sore throat, sores in mouth, or a toothache) UNUSUAL RASH, SWELLING OR PAIN  UNUSUAL VAGINAL DISCHARGE OR ITCHING   Items with * indicate a potential emergency and should be followed up as soon as possible or go to the Emergency Department if any problems should occur.  Please show the CHEMOTHERAPY ALERT CARD or  IMMUNOTHERAPY ALERT CARD at check-in to the Emergency Department and triage nurse.  Should you have questions after your visit or need to cancel or reschedule your appointment, please contact [facility]  Dept: [phone]  and follow the prompts.  Office hours are 8:00 a.m. to 4:30 p.m. Monday - Friday. Please note that voicemails left after 4:00 p.m. may not be returned until the following business day.  We are closed weekends and major holidays. You have access to a nurse at all times for urgent questions. Please call the main number to the clinic Dept: [phone] and follow the prompts.   For any non-urgent questions, you may also contact your provider using MyChart. We now offer e-Visits for anyone 18 and older to request care online for non-urgent symptoms. For details visit [email or web address].   Also download the MyChart app! Go to the app store, search "MyChart", open the app, select , and log in with your MyChart username and password.  Due to Covid, a mask is required upon entering the hospital/clinic. If you do not have a mask, one will be given to you upon arrival. For doctor visits, patients may have 1 support person aged 18 or older with them. For treatment visits, patients cannot have anyone with them due to current Covid guidelines and our immunocompromised population.   
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
## 2020-12-09 NOTE — Progress Notes (Signed)
Spoke with Dr. Julien Nordmann and he would like for [place] to increase carboplatin dose.

## 2020-12-11 ENCOUNTER — Ambulatory Visit: Payer: PPO

## 2020-12-15 ENCOUNTER — Other Ambulatory Visit: Payer: Self-pay | Admitting: *Deleted

## 2020-12-15 DIAGNOSIS — D649 Anemia, unspecified: Secondary | ICD-10-CM

## 2020-12-15 DIAGNOSIS — C3491 Malignant neoplasm of unspecified part of right bronchus or lung: Secondary | ICD-10-CM

## 2020-12-16 ENCOUNTER — Telehealth: Payer: Self-pay | Admitting: Medical Oncology

## 2020-12-16 ENCOUNTER — Other Ambulatory Visit: Payer: Self-pay

## 2020-12-16 ENCOUNTER — Inpatient Hospital Stay: Payer: PPO

## 2020-12-16 DIAGNOSIS — Z5112 Encounter for antineoplastic immunotherapy: Secondary | ICD-10-CM | POA: Diagnosis not present

## 2020-12-16 DIAGNOSIS — C3491 Malignant neoplasm of unspecified part of right bronchus or lung: Secondary | ICD-10-CM

## 2020-12-16 DIAGNOSIS — D649 Anemia, unspecified: Secondary | ICD-10-CM

## 2020-12-16 LAB — CMP (CANCER CENTER ONLY)
ALT: 32 U/L (ref 0–44)
AST: 28 U/L (ref 15–41)
Albumin: 3.6 g/dL (ref 3.5–5.0)
Alkaline Phosphatase: 55 U/L (ref 38–126)
Anion gap: 10 (ref 5–15)
BUN: 15 mg/dL (ref 8–23)
CO2: 28 mmol/L (ref 22–32)
Calcium: 9.3 mg/dL (ref 8.9–10.3)
Chloride: 108 mmol/L (ref 98–111)
Creatinine: 0.83 mg/dL (ref 0.61–1.24)
GFR, Estimated: >60 mL/min (ref >60)
Glucose, Bld: 122 mg/dL — ABNORMAL HIGH (ref 70–99)
Potassium: 3.5 mmol/L (ref 3.5–5.1)
Sodium: 146 mmol/L — ABNORMAL HIGH (ref 135–145)
Total Bilirubin: 1.1 mg/dL (ref 0.3–1.2)
Total Protein: 7.4 g/dL (ref 6.5–8.1)

## 2020-12-16 LAB — CBC WITH DIFFERENTIAL (CANCER CENTER ONLY)
Abs Immature Granulocytes: 0 10*3/uL (ref 0.00–0.07)
Basophils Absolute: 0 10*3/uL (ref 0.0–0.1)
Basophils Relative: 1 %
Eosinophils Absolute: 0.1 10*3/uL (ref 0.0–0.5)
Eosinophils Relative: 4 %
HCT: 27.7 % — ABNORMAL LOW (ref 39.0–52.0)
Hemoglobin: 9.1 g/dL — ABNORMAL LOW (ref 13.0–17.0)
Immature Granulocytes: 0 %
Lymphocytes Relative: 33 %
Lymphs Abs: 0.5 10*3/uL — ABNORMAL LOW (ref 0.7–4.0)
MCH: 31 pg (ref 26.0–34.0)
MCHC: 32.9 g/dL (ref 30.0–36.0)
MCV: 94.2 fL (ref 80.0–100.0)
Monocytes Absolute: 0 10*3/uL — ABNORMAL LOW (ref 0.1–1.0)
Monocytes Relative: 3 %
Neutro Abs: 1 10*3/uL — ABNORMAL LOW (ref 1.7–7.7)
Neutrophils Relative %: 59 %
Platelet Count: 200 10*3/uL (ref 150–400)
RBC: 2.94 MIL/uL — ABNORMAL LOW (ref 4.22–5.81)
RDW: 15.6 % — ABNORMAL HIGH (ref 11.5–15.5)
Smear Review: NORMAL
WBC Count: 1.6 10*3/uL — ABNORMAL LOW (ref 4.0–10.5)
nRBC: 0 % (ref 0.0–0.2)

## 2020-12-16 LAB — SAMPLE TO BLOOD BANK

## 2020-12-16 NOTE — Telephone Encounter (Signed)
Reviewed neutropenic precautions with pt and sister, [name]. They voiced understanding and when to call for temp >100.67f.

## 2020-12-21 DIAGNOSIS — Z79891 Long term (current) use of opiate analgesic: Secondary | ICD-10-CM | POA: Diagnosis not present

## 2020-12-21 DIAGNOSIS — G893 Neoplasm related pain (acute) (chronic): Secondary | ICD-10-CM | POA: Diagnosis not present

## 2020-12-21 DIAGNOSIS — G894 Chronic pain syndrome: Secondary | ICD-10-CM | POA: Diagnosis not present

## 2020-12-21 DIAGNOSIS — M150 Primary generalized (osteo)arthritis: Secondary | ICD-10-CM | POA: Diagnosis not present

## 2020-12-22 ENCOUNTER — Other Ambulatory Visit: Payer: Self-pay | Admitting: Physician Assistant

## 2020-12-22 DIAGNOSIS — C3491 Malignant neoplasm of unspecified part of right bronchus or lung: Secondary | ICD-10-CM

## 2020-12-23 ENCOUNTER — Telehealth: Payer: Self-pay | Admitting: Internal Medicine

## 2020-12-23 ENCOUNTER — Other Ambulatory Visit: Payer: Self-pay | Admitting: Physician Assistant

## 2020-12-23 ENCOUNTER — Other Ambulatory Visit: Payer: Self-pay

## 2020-12-23 ENCOUNTER — Inpatient Hospital Stay: Payer: PPO

## 2020-12-23 ENCOUNTER — Telehealth: Payer: Self-pay | Admitting: Physician Assistant

## 2020-12-23 ENCOUNTER — Telehealth: Payer: Self-pay

## 2020-12-23 DIAGNOSIS — D649 Anemia, unspecified: Secondary | ICD-10-CM

## 2020-12-23 DIAGNOSIS — E876 Hypokalemia: Secondary | ICD-10-CM

## 2020-12-23 DIAGNOSIS — D702 Other drug-induced agranulocytosis: Secondary | ICD-10-CM | POA: Insufficient documentation

## 2020-12-23 DIAGNOSIS — Z5112 Encounter for antineoplastic immunotherapy: Secondary | ICD-10-CM | POA: Diagnosis not present

## 2020-12-23 DIAGNOSIS — C3491 Malignant neoplasm of unspecified part of right bronchus or lung: Secondary | ICD-10-CM

## 2020-12-23 LAB — CBC WITH DIFFERENTIAL (CANCER CENTER ONLY)
Abs Immature Granulocytes: 0 10*3/uL (ref 0.00–0.07)
Basophils Absolute: 0 10*3/uL (ref 0.0–0.1)
Basophils Relative: 0 %
Eosinophils Absolute: 0.1 10*3/uL (ref 0.0–0.5)
Eosinophils Relative: 13 %
HCT: 21.4 % — ABNORMAL LOW (ref 39.0–52.0)
Hemoglobin: 7.2 g/dL — ABNORMAL LOW (ref 13.0–17.0)
Immature Granulocytes: 0 %
Lymphocytes Relative: 50 %
Lymphs Abs: 0.6 10*3/uL — ABNORMAL LOW (ref 0.7–4.0)
MCH: 30.8 pg (ref 26.0–34.0)
MCHC: 33.6 g/dL (ref 30.0–36.0)
MCV: 91.5 fL (ref 80.0–100.0)
Monocytes Absolute: 0.2 10*3/uL (ref 0.1–1.0)
Monocytes Relative: 17 %
Neutro Abs: 0.2 10*3/uL — CL (ref 1.7–7.7)
Neutrophils Relative %: 20 %
Platelet Count: 30 10*3/uL — ABNORMAL LOW (ref 150–400)
RBC: 2.34 MIL/uL — ABNORMAL LOW (ref 4.22–5.81)
RDW: 14.3 % (ref 11.5–15.5)
Smear Review: NORMAL
WBC Count: 1.1 10*3/uL — ABNORMAL LOW (ref 4.0–10.5)
nRBC: 0 % (ref 0.0–0.2)

## 2020-12-23 LAB — CMP (CANCER CENTER ONLY)
ALT: 18 U/L (ref 0–44)
AST: 21 U/L (ref 15–41)
Albumin: 3.4 g/dL — ABNORMAL LOW (ref 3.5–5.0)
Alkaline Phosphatase: 69 U/L (ref 38–126)
Anion gap: 12 (ref 5–15)
BUN: 7 mg/dL — ABNORMAL LOW (ref 8–23)
CO2: 25 mmol/L (ref 22–32)
Calcium: 9.3 mg/dL (ref 8.9–10.3)
Chloride: 104 mmol/L (ref 98–111)
Creatinine: 1 mg/dL (ref 0.61–1.24)
GFR, Estimated: >60 mL/min (ref >60)
Glucose, Bld: 113 mg/dL — ABNORMAL HIGH (ref 70–99)
Potassium: 3.1 mmol/L — ABNORMAL LOW (ref 3.5–5.1)
Sodium: 141 mmol/L (ref 135–145)
Total Bilirubin: 0.8 mg/dL (ref 0.3–1.2)
Total Protein: 7.1 g/dL (ref 6.5–8.1)

## 2020-12-23 LAB — PREPARE RBC (CROSSMATCH)

## 2020-12-23 LAB — TSH: TSH: 1.195 u[IU]/mL (ref 0.320–4.118)

## 2020-12-23 MED ORDER — POTASSIUM CHLORIDE CRYS ER 20 MEQ PO TBCR: 20.0000 meq | EXTENDED_RELEASE_TABLET | Freq: Every day | ORAL | 0 refills | Status: DC

## 2020-12-23 NOTE — Telephone Encounter (Signed)
This nurse spoke with patients sister and made aware of blood transfusion at [facility] on Friday 10/21 at 830am.  Patients sister acknowledges understanding and is in agreement with appointment time.  Patient made aware of bleeding precautions.  No further questions or concerns at this time.

## 2020-12-23 NOTE — Progress Notes (Signed)
CRITICAL VALUE STICKER  CRITICAL VALUE: HGB 7.2, Platelets 30, ANC 0.2  RECEIVER (on-site recipient of call): Ka Flammer P. LPN  DATE & TIME NOTIFIED: 12/23/2020 1:47 pm  MESSENGER (representative from lab): Ulice Dash  MD NOTIFIED:  [name] Heilingoetter, PA  TIME OF NOTIFICATION: 1:52 pm  RESPONSE:  Scheduled for Blood Transfusion

## 2020-12-23 NOTE — Telephone Encounter (Signed)
I called the patient's sister to make sure she was aware of the injection appointments and the low potassium as seen on labs.  I also explained the pancytopenia which we will discuss in more detail at his appointment next week.  In the meantime he is scheduled to receive Granix for the next 3 days and [place] and 2 units of blood on Friday.  Advised to call if any signs and symptoms of significant bleeding in the interval.

## 2020-12-23 NOTE — Telephone Encounter (Signed)
Scheduled per sch msg. Called and spoke with patients mom. Confirmed appt

## 2020-12-24 ENCOUNTER — Inpatient Hospital Stay: Payer: PPO

## 2020-12-24 VITALS — BP 148/73 | HR 118 | Temp 97.9°F | Resp 20

## 2020-12-24 DIAGNOSIS — T451X5A Adverse effect of antineoplastic and immunosuppressive drugs, initial encounter: Secondary | ICD-10-CM

## 2020-12-24 DIAGNOSIS — Z5112 Encounter for antineoplastic immunotherapy: Secondary | ICD-10-CM | POA: Diagnosis not present

## 2020-12-24 DIAGNOSIS — D702 Other drug-induced agranulocytosis: Secondary | ICD-10-CM

## 2020-12-24 MED ORDER — FILGRASTIM-AAFI 480 MCG/0.8ML IJ SOSY
480.0000 ug | PREFILLED_SYRINGE | Freq: Once | INTRAMUSCULAR | Status: AC
  Administered 2020-12-24: 480 ug via SUBCUTANEOUS
  Filled 2020-12-24: qty 0.8

## 2020-12-24 MED ORDER — TBO-FILGRASTIM 480 MCG/0.8ML ~~LOC~~ SOSY: 480.0000 ug | PREFILLED_SYRINGE | Freq: Once | SUBCUTANEOUS | Status: DC

## 2020-12-24 NOTE — Progress Notes (Signed)
The following biosimilar Nivestym ([id number]) has been selected for use in this patient.  Kennith Center, Pharm.D., CPP 12/24/2020@2 :59 PM

## 2020-12-25 ENCOUNTER — Other Ambulatory Visit: Payer: Self-pay

## 2020-12-25 ENCOUNTER — Inpatient Hospital Stay: Payer: PPO

## 2020-12-25 VITALS — BP 148/92 | HR 72 | Temp 97.5°F | Resp 18

## 2020-12-25 DIAGNOSIS — T451X5A Adverse effect of antineoplastic and immunosuppressive drugs, initial encounter: Secondary | ICD-10-CM

## 2020-12-25 DIAGNOSIS — Z5112 Encounter for antineoplastic immunotherapy: Secondary | ICD-10-CM | POA: Diagnosis not present

## 2020-12-25 DIAGNOSIS — D649 Anemia, unspecified: Secondary | ICD-10-CM

## 2020-12-25 DIAGNOSIS — D702 Other drug-induced agranulocytosis: Secondary | ICD-10-CM

## 2020-12-25 MED ORDER — DIPHENHYDRAMINE HCL 25 MG PO CAPS
25.0000 mg | ORAL_CAPSULE | Freq: Once | ORAL | Status: AC
  Administered 2020-12-25: 25 mg via ORAL
  Filled 2020-12-25: qty 1

## 2020-12-25 MED ORDER — FILGRASTIM-AAFI 480 MCG/0.8ML IJ SOSY
480.0000 ug | PREFILLED_SYRINGE | Freq: Once | INTRAMUSCULAR | Status: AC
  Administered 2020-12-25: 480 ug via SUBCUTANEOUS
  Filled 2020-12-25: qty 0.8

## 2020-12-25 MED ORDER — ACETAMINOPHEN 325 MG PO TABS
650.0000 mg | ORAL_TABLET | Freq: Once | ORAL | Status: AC
  Administered 2020-12-25: 650 mg via ORAL
  Filled 2020-12-25: qty 2

## 2020-12-25 MED ORDER — SODIUM CHLORIDE 0.9% IV SOLUTION
250.0000 mL | Freq: Once | INTRAVENOUS | Status: AC
  Administered 2020-12-25: 250 mL via INTRAVENOUS

## 2020-12-25 NOTE — Patient Instructions (Signed)
[facility]  Discharge Instructions: Thank you for choosing [facility] to provide your oncology and hematology care.   If you have a lab appointment with the [facility], please go directly to the [facility] and check in at the registration area.   Wear comfortable clothing and clothing appropriate for easy access to any Portacath or PICC line.   We strive to give you quality time with your provider. You may need to reschedule your appointment if you arrive late (15 or more minutes).  Arriving late affects you and other patients whose appointments are after yours.  Also, if you miss three or more appointments without notifying the office, you may be dismissed from the clinic at the provider's discretion.      For prescription refill requests, have your pharmacy contact our office and allow 72 hours for refills to be completed.    Today you received the following chemotherapy and/or immunotherapy agents blood and granix       To help prevent nausea and vomiting after your treatment, we encourage you to take your nausea medication as directed.  BELOW ARE SYMPTOMS THAT SHOULD BE REPORTED IMMEDIATELY: *FEVER GREATER THAN 100.4 F (38 C) OR HIGHER *CHILLS OR SWEATING *NAUSEA AND VOMITING THAT IS NOT CONTROLLED WITH YOUR NAUSEA MEDICATION *UNUSUAL SHORTNESS OF BREATH *UNUSUAL BRUISING OR BLEEDING *URINARY PROBLEMS (pain or burning when urinating, or frequent urination) *BOWEL PROBLEMS (unusual diarrhea, constipation, pain near the anus) TENDERNESS IN MOUTH AND THROAT WITH OR WITHOUT PRESENCE OF ULCERS (sore throat, sores in mouth, or a toothache) UNUSUAL RASH, SWELLING OR PAIN  UNUSUAL VAGINAL DISCHARGE OR ITCHING   Items with * indicate a potential emergency and should be followed up as soon as possible or go to the Emergency Department if any problems should occur.  Please show the CHEMOTHERAPY ALERT CARD or IMMUNOTHERAPY ALERT CARD at  check-in to the Emergency Department and triage nurse.  Should you have questions after your visit or need to cancel or reschedule your appointment, please contact [facility]  Dept: [phone]  and follow the prompts.  Office hours are 8:00 a.m. to 4:30 p.m. Monday - Friday. Please note that voicemails left after 4:00 p.m. may not be returned until the following business day.  We are closed weekends and major holidays. You have access to a nurse at all times for urgent questions. Please call the main number to the clinic Dept: [phone] and follow the prompts.   For any non-urgent questions, you may also contact your provider using MyChart. We now offer e-Visits for anyone 56 and older to request care online for non-urgent symptoms. For details visit [email or web address].   Also download the MyChart app! Go to the app store, search "MyChart", open the app, select , and log in with your MyChart username and password.  Due to Covid, a mask is required upon entering the hospital/clinic. If you do not have a mask, one will be given to you upon arrival. For doctor visits, patients may have 1 support person aged 60 or older with them. For treatment visits, patients cannot have anyone with them due to current Covid guidelines and our immunocompromised population.   Blood Transfusion, Adult, Care After This sheet gives you information about how to care for yourself after your procedure. Your doctor may also give you more specific instructions. If you have problems or questions, contact your doctor. What can I expect after the procedure? After the  procedure, it is common to have: Bruising and soreness at the IV site. A headache. Follow these instructions at home: Insertion site care   Follow instructions from your doctor about how to take care of your insertion site. This is where an IV tube was put into your vein. Make sure you: Wash your hands with soap  and water before and after you change your bandage (dressing). If you cannot use soap and water, use hand sanitizer. Change your bandage as told by your doctor. Check your insertion site every day for signs of infection. Check for: Redness, swelling, or pain. Bleeding from the site. Warmth. Pus or a bad smell. General instructions Take over-the-counter and prescription medicines only as told by your doctor. Rest as told by your doctor. Go back to your normal activities as told by your doctor. Keep all follow-up visits as told by your doctor. This is important. Contact a doctor if: You have itching or red, swollen areas of skin (hives). You feel worried or nervous (anxious). You feel weak after doing your normal activities. You have redness, swelling, warmth, or pain around the insertion site. You have blood coming from the insertion site, and the blood does not stop with pressure. You have pus or a bad smell coming from the insertion site. Get help right away if: You have signs of a serious reaction. This may be coming from an allergy or the body's defense system (immune system). Signs include: Trouble breathing or shortness of breath. Swelling of the face or feeling warm (flushed). Fever or chills. Head, chest, or back pain. Dark pee (urine) or blood in the pee. Widespread rash. Fast heartbeat. Feeling dizzy or light-headed. You may receive your blood transfusion in an outpatient setting. If so, you will be told whom to contact to report any reactions. These symptoms may be an emergency. Do not wait to see if the symptoms will go away. Get medical help right away. Call your local emergency services (911 in the [place] Do not drive yourself to the hospital. Summary Bruising and soreness at the IV site are common. Check your insertion site every day for signs of infection. Rest as told by your doctor. Go back to your normal activities as told by your doctor. Get help right away if  you have signs of a serious reaction. This information is not intended to replace advice given to you by your health care provider. Make sure you discuss any questions you have with your health care provider. Document Revised: 06/18/2020 Document Reviewed: 08/16/2018 Elsevier Patient Education  [place].  Tbo-Filgrastim injection What is this medication? TBO-FILGRASTIM (T B O fil GRA stim) is a granulocyte colony-stimulating factor that helps you make more neutrophils, a type of white blood cell. Neutrophils are important for fighting infections. Some chemotherapy affects your bone marrow and lowers your neutrophils. This medicine helps decrease the length of time that neutrophils are very low (severe neutropenia). This medicine may be used for other purposes; ask your health care provider or pharmacist if you have questions. COMMON BRAND NAME(S): Granix What should I tell my care team before I take this medication? They need to know if you have any of these conditions: bone scan or tests planned kidney disease sickle cell anemia an unusual or allergic reaction to tbo-filgrastim, filgrastim, pegfilgrastim, other medicines, foods, dyes, or preservatives pregnant or trying to get pregnant breast-feeding How should I use this medication? This medicine is for injection under the skin. If you get this medicine  at home, you will be taught how to prepare and give this medicine. Refer to the Instructions for Use that come with your medication packaging. Use exactly as directed. Take your medicine at regular intervals. Do not take your medicine more often than directed. It is important that you put your used needles and syringes in a special sharps container. Do not put them in a trash can. If you do not have a sharps container, call your pharmacist or healthcare provider to get one. Talk to your pediatrician regarding the use of this medicine in children. While this drug may be prescribed for  children as young as 10 month of age for selected conditions, precautions do apply. Overdosage: If you think you have taken too much of this medicine contact a poison control center or emergency room at once. NOTE: This medicine is only for you. Do not share this medicine with others. What if I miss a dose? It is important not to miss your dose. Call your doctor or health care professional if you miss a dose. What may interact with this medication? This medicine may interact with the following medications: medicines that may cause a release of neutrophils, such as lithium This list may not describe all possible interactions. Give your health care provider a list of all the medicines, herbs, non-prescription drugs, or dietary supplements you use. Also tell them if you smoke, drink alcohol, or use illegal drugs. Some items may interact with your medicine. What should I watch for while using this medication? You may need blood work done while you are taking this medicine. What side effects may I notice from receiving this medication? Side effects that you should report to your doctor or health care professional as soon as possible: allergic reactions like skin rash, itching or hives, swelling of the face, lips, or tongue back pain blood in the urine dark urine dizziness fast heartbeat feeling faint shortness of breath or breathing problems signs and symptoms of infection like fever or chills; cough; or sore throat signs and symptoms of kidney injury like trouble passing urine or change in the amount of urine stomach or side pain, or pain at the shoulder sweating swelling of the legs, ankles, or abdomen tiredness Side effects that usually do not require medical attention (report to your doctor or health care professional if they continue or are bothersome): bone pain diarrhea headache muscle pain vomiting This list may not describe all possible side effects. Call your doctor for medical  advice about side effects. You may report side effects to FDA at 1-800-FDA-1088. Where should I keep my medication? Keep out of the reach of children. Store in a refrigerator between 2 and 8 degrees C (36 and 46 degrees F). Keep in carton to protect from light. Throw away this medicine if it is left out of the refrigerator for more than 5 consecutive days. Throw away any unused medicine after the expiration date. NOTE: This sheet is a summary. It may not cover all possible information. If you have questions about this medicine, talk to your doctor, pharmacist, or health care provider.  2022 Elsevier/Gold Standard (2017-12-23 19:58:39)

## 2020-12-26 ENCOUNTER — Ambulatory Visit: Payer: PPO

## 2020-12-26 ENCOUNTER — Inpatient Hospital Stay: Payer: PPO

## 2020-12-26 ENCOUNTER — Other Ambulatory Visit: Payer: Self-pay

## 2020-12-26 VITALS — BP 128/61 | HR 88 | Temp 97.9°F | Resp 20

## 2020-12-26 DIAGNOSIS — Z5112 Encounter for antineoplastic immunotherapy: Secondary | ICD-10-CM | POA: Diagnosis not present

## 2020-12-26 DIAGNOSIS — D702 Other drug-induced agranulocytosis: Secondary | ICD-10-CM

## 2020-12-26 DIAGNOSIS — T451X5A Adverse effect of antineoplastic and immunosuppressive drugs, initial encounter: Secondary | ICD-10-CM

## 2020-12-26 DIAGNOSIS — D701 Agranulocytosis secondary to cancer chemotherapy: Secondary | ICD-10-CM

## 2020-12-26 MED ORDER — FILGRASTIM-AAFI 480 MCG/0.8ML IJ SOSY
480.0000 ug | PREFILLED_SYRINGE | Freq: Once | INTRAMUSCULAR | Status: AC
  Administered 2020-12-26: 480 ug via SUBCUTANEOUS

## 2020-12-26 NOTE — Patient Instructions (Signed)
Filgrastim, G-CSF injection What is this medication? FILGRASTIM, G-CSF (fil GRA stim) is a granulocyte colony-stimulating factor that stimulates the growth of neutrophils, a type of white blood cell (WBC) important in the body's fight against infection. It is used to reduce the incidence of fever and infection in patients with certain types of cancer who are receiving chemotherapy that affects the bone marrow, to stimulate blood cell production for removal of WBCs from the body prior to a bone marrow transplantation, to reduce the incidence of fever and infection in patients who have severe chronic neutropenia, and to improve survival outcomes following high-dose radiation exposure that is toxic to the bone marrow. This medicine may be used for other purposes; ask your health care provider or pharmacist if you have questions. COMMON BRAND NAME(S): Neupogen, Nivestym, Releuko, Zarxio What should I tell my care team before I take this medication? They need to know if you have any of these conditions: kidney disease latex allergy ongoing radiation therapy sickle cell disease an unusual or allergic reaction to filgrastim, pegfilgrastim, other medicines, foods, dyes, or preservatives pregnant or trying to get pregnant breast-feeding How should I use this medication? This medicine is for injection under the skin or infusion into a vein. As an infusion into a vein, it is usually given by a health care professional in a hospital or clinic setting. If you get this medicine at home, you will be taught how to prepare and give this medicine. Refer to the Instructions for Use that come with your medication packaging. Use exactly as directed. Take your medicine at regular intervals. Do not take your medicine more often than directed. It is important that you put your used needles and syringes in a special sharps container. Do not put them in a trash can. If you do not have a sharps container, call your pharmacist  or healthcare provider to get one. Talk to your pediatrician regarding the use of this medicine in children. While this drug may be prescribed for children as young as 7 months for selected conditions, precautions do apply. Overdosage: If you think you have taken too much of this medicine contact a poison control center or emergency room at once. NOTE: This medicine is only for you. Do not share this medicine with others. What if I miss a dose? It is important not to miss your dose. Call your doctor or health care professional if you miss a dose. What may interact with this medication? This medicine may interact with the following medications: medicines that may cause a release of neutrophils, such as lithium This list may not describe all possible interactions. Give your health care provider a list of all the medicines, herbs, non-prescription drugs, or dietary supplements you use. Also tell them if you smoke, drink alcohol, or use illegal drugs. Some items may interact with your medicine. What should I watch for while using this medication? Your condition will be monitored carefully while you are receiving this medicine. You may need blood work done while you are taking this medicine. Talk to your health care provider about your risk of cancer. You may be more at risk for certain types of cancer if you take this medicine. What side effects may I notice from receiving this medication? Side effects that you should report to your doctor or health care professional as soon as possible: allergic reactions like skin rash, itching or hives, swelling of the face, lips, or tongue back pain dizziness or feeling faint fever pain, redness, or   irritation at site where injected pinpoint red spots on the skin shortness of breath or breathing problems signs and symptoms of kidney injury like trouble passing urine, change in the amount of urine, or red or dark-brown urine stomach or side pain, or pain at  the shoulder swelling tiredness unusual bleeding or bruising Side effects that usually do not require medical attention (report to your doctor or health care professional if they continue or are bothersome): bone pain cough diarrhea hair loss headache muscle pain This list may not describe all possible side effects. Call your doctor for medical advice about side effects. You may report side effects to FDA at 1-800-FDA-1088. Where should I keep my medication? Keep out of the reach of children. Store in a refrigerator between 2 and 8 degrees C (36 and 46 degrees F). Do not freeze. Keep in carton to protect from light. Throw away this medicine if vials or syringes are left out of the refrigerator for more than 24 hours. Throw away any unused medicine after the expiration date. NOTE: This sheet is a summary. It may not cover all possible information. If you have questions about this medicine, talk to your doctor, pharmacist, or health care provider.  2022 Elsevier/Gold Standard (2019-03-14 18:47:55)  

## 2020-12-28 LAB — TYPE AND SCREEN
ABO/RH(D): O NEG
Antibody Screen: NEGATIVE
Unit division: 0
Unit division: 0

## 2020-12-28 LAB — BPAM RBC
Blood Product Expiration Date: 202211222359
Blood Product Expiration Date: 202211242359
ISSUE DATE / TIME: 202210210735
ISSUE DATE / TIME: 202210210735
Unit Type and Rh: 9500
Unit Type and Rh: 9500

## 2020-12-28 NOTE — Progress Notes (Signed)
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 Zofran to alternate with Compazine if needed for improved control of his nausea.  Advised not to take this within 3 days of chemotherapy. Regarding the watery eyes, this may be secondary to his Alimta and he was advised to use Refresh Tears if needed.  The patient had stool cards previously to assess for GI blood loss which was  When next labs are drawn, sample tube will be sent to blood bank in case type and screen is necessary.   Fluids. Zofran. Blood next   The patient was advised to call immediately if he has any concerning symptoms in the interval. The patient voices understanding of current disease status and treatment options and is in agreement with the current  care plan. All questions were answered. The patient knows to call the clinic with any problems, questions or concerns. We can certainly see the patient much sooner if necessary     Orders Placed This Encounter  Procedures   Vitamin B12    Standing Status:   Future    Standing Expiration Date:   12/30/2021   Folate    Standing Status:   Future     Standing Expiration Date:   12/30/2021   Iron and TIBC    Standing Status:   Future    Standing Expiration Date:   12/30/2021   Ferritin    Standing Status:   Future    Standing Expiration Date:   12/30/2021   Sample to Blood Bank    Standing Status:   Standing    Number of Occurrences:   3    Standing Expiration Date:   12/30/2021      Tobe Sos Trana Ressler, PA-C 12/30/20  ADDENDUM: Hematology/Oncology Attending: I had a face-to-face encounter with the patient today.  I reviewed his record, lab and recommended his care plan.  This is a very pleasant 65 years old white male with a stage IV (T3, N2, M1 C) non-small cell lung cancer, likely adenocarcinoma based on the molecular studies with positive KRAS G12C mutation.  The patient is currently undergoing systemic chemotherapy with carboplatin, Alimta and Keytruda status post 3 cycles.  He has a rough time with this treatment especially with increasing fatigue and weakness as well as pancytopenia and anemia requiring PRBCs transfusion. He is currently on a reduced dose of carboplatin for AUC of 4 and Alimta 400 Mg/M2. I recommended for the patient to continue his current treatment with the same regimen. For the chemotherapy-induced anemia, I will arrange for the patient to receive 1 unit of PRBCs transfusion. For the persistent anemia, will check his anemia panel today to rule out any underlying etiology besides the chemotherapy adverse effects. The patient will come back for follow-up visit in 3 weeks for evaluation before starting cycle #5 which will be the first cycle of maintenance treatment with Alimta and Keytruda every 3 weeks. He was advised to call immediately if he has any other concerning symptoms in the interval. The total time spent in the appointment was 30 minutes. Disclaimer: This note was dictated with voice recognition software. Similar sounding words can inadvertently be transcribed and may be missed upon  review. Eilleen Kempf, MD 12/30/20

## 2020-12-30 ENCOUNTER — Other Ambulatory Visit: Payer: Self-pay

## 2020-12-30 ENCOUNTER — Inpatient Hospital Stay (HOSPITAL_BASED_OUTPATIENT_CLINIC_OR_DEPARTMENT_OTHER): Payer: PPO | Admitting: Physician Assistant

## 2020-12-30 ENCOUNTER — Inpatient Hospital Stay: Payer: PPO

## 2020-12-30 VITALS — BP 120/70 | HR 78 | Temp 97.9°F | Resp 18

## 2020-12-30 VITALS — BP 133/71 | HR 99 | Temp 97.3°F | Resp 18 | Ht 68.0 in | Wt 239.4 lb

## 2020-12-30 DIAGNOSIS — Z5112 Encounter for antineoplastic immunotherapy: Secondary | ICD-10-CM

## 2020-12-30 DIAGNOSIS — D649 Anemia, unspecified: Secondary | ICD-10-CM

## 2020-12-30 DIAGNOSIS — C3491 Malignant neoplasm of unspecified part of right bronchus or lung: Secondary | ICD-10-CM

## 2020-12-30 DIAGNOSIS — Z5111 Encounter for antineoplastic chemotherapy: Secondary | ICD-10-CM

## 2020-12-30 DIAGNOSIS — R112 Nausea with vomiting, unspecified: Secondary | ICD-10-CM

## 2020-12-30 LAB — CMP (CANCER CENTER ONLY)
ALT: 14 U/L (ref 0–44)
AST: 25 U/L (ref 15–41)
Albumin: 3.3 g/dL — ABNORMAL LOW (ref 3.5–5.0)
Alkaline Phosphatase: 85 U/L (ref 38–126)
Anion gap: 11 (ref 5–15)
BUN: 10 mg/dL (ref 8–23)
CO2: 25 mmol/L (ref 22–32)
Calcium: 8.8 mg/dL — ABNORMAL LOW (ref 8.9–10.3)
Chloride: 109 mmol/L (ref 98–111)
Creatinine: 1.21 mg/dL (ref 0.61–1.24)
GFR, Estimated: >60 mL/min (ref >60)
Glucose, Bld: 101 mg/dL — ABNORMAL HIGH (ref 70–99)
Potassium: 3.9 mmol/L (ref 3.5–5.1)
Sodium: 145 mmol/L (ref 135–145)
Total Bilirubin: 0.5 mg/dL (ref 0.3–1.2)
Total Protein: 6.4 g/dL — ABNORMAL LOW (ref 6.5–8.1)

## 2020-12-30 LAB — SAMPLE TO BLOOD BANK

## 2020-12-30 LAB — CBC WITH DIFFERENTIAL (CANCER CENTER ONLY)
Abs Immature Granulocytes: 0.29 10*3/uL — ABNORMAL HIGH (ref 0.00–0.07)
Basophils Absolute: 0 10*3/uL (ref 0.0–0.1)
Basophils Relative: 0 %
Eosinophils Absolute: 0.1 10*3/uL (ref 0.0–0.5)
Eosinophils Relative: 3 %
HCT: 26.8 % — ABNORMAL LOW (ref 39.0–52.0)
Hemoglobin: 8.4 g/dL — ABNORMAL LOW (ref 13.0–17.0)
Immature Granulocytes: 7 %
Lymphocytes Relative: 23 %
Lymphs Abs: 1 10*3/uL (ref 0.7–4.0)
MCH: 29.7 pg (ref 26.0–34.0)
MCHC: 31.3 g/dL (ref 30.0–36.0)
MCV: 94.7 fL (ref 80.0–100.0)
Monocytes Absolute: 0.9 10*3/uL (ref 0.1–1.0)
Monocytes Relative: 21 %
Neutro Abs: 2.1 10*3/uL (ref 1.7–7.7)
Neutrophils Relative %: 46 %
Platelet Count: 179 10*3/uL (ref 150–400)
RBC: 2.83 MIL/uL — ABNORMAL LOW (ref 4.22–5.81)
RDW: 18.6 % — ABNORMAL HIGH (ref 11.5–15.5)
WBC Count: 4.5 10*3/uL (ref 4.0–10.5)
nRBC: 0.4 % — ABNORMAL HIGH (ref 0.0–0.2)

## 2020-12-30 LAB — TSH: TSH: 1.605 u[IU]/mL (ref 0.320–4.118)

## 2020-12-30 LAB — PREPARE RBC (CROSSMATCH)

## 2020-12-30 MED ORDER — PALONOSETRON HCL INJECTION 0.25 MG/5ML
0.2500 mg | Freq: Once | INTRAVENOUS | Status: AC
  Administered 2020-12-30: 0.25 mg via INTRAVENOUS
  Filled 2020-12-30: qty 5

## 2020-12-30 MED ORDER — DIPHENHYDRAMINE HCL 25 MG PO CAPS
25.0000 mg | ORAL_CAPSULE | Freq: Once | ORAL | Status: AC
  Administered 2020-12-30: 25 mg via ORAL
  Filled 2020-12-30: qty 1

## 2020-12-30 MED ORDER — SODIUM CHLORIDE 0.9% IV SOLUTION
250.0000 mL | Freq: Once | INTRAVENOUS | Status: AC
  Administered 2020-12-30: 250 mL via INTRAVENOUS

## 2020-12-30 MED ORDER — HEPARIN SOD (PORK) LOCK FLUSH 100 UNIT/ML IV SOLN: 250.0000 [IU] | INTRAVENOUS | Status: DC | PRN

## 2020-12-30 MED ORDER — SODIUM CHLORIDE 0.9% FLUSH: 10.0000 mL | INTRAVENOUS | Status: DC | PRN

## 2020-12-30 MED ORDER — SODIUM CHLORIDE 0.9 % IV SOLN
200.0000 mg | Freq: Once | INTRAVENOUS | Status: AC
  Administered 2020-12-30: 200 mg via INTRAVENOUS
  Filled 2020-12-30: qty 8

## 2020-12-30 MED ORDER — ONDANSETRON HCL 8 MG PO TABS: 8.0000 mg | ORAL_TABLET | Freq: Three times a day (TID) | ORAL | 2 refills | Status: DC | PRN

## 2020-12-30 MED ORDER — SODIUM CHLORIDE 0.9 % IV SOLN
434.0000 mg | Freq: Once | INTRAVENOUS | Status: AC
  Administered 2020-12-30: 430 mg via INTRAVENOUS
  Filled 2020-12-30: qty 43

## 2020-12-30 MED ORDER — SODIUM CHLORIDE 0.9 % IV SOLN
10.0000 mg | Freq: Once | INTRAVENOUS | Status: AC
  Administered 2020-12-30: 10 mg via INTRAVENOUS
  Filled 2020-12-30: qty 10

## 2020-12-30 MED ORDER — SODIUM CHLORIDE 0.9 % IV SOLN
400.0000 mg/m2 | Freq: Once | INTRAVENOUS | Status: AC
  Administered 2020-12-30: 900 mg via INTRAVENOUS
  Filled 2020-12-30: qty 20

## 2020-12-30 MED ORDER — SODIUM CHLORIDE 0.9% FLUSH: 3.0000 mL | INTRAVENOUS | Status: DC | PRN

## 2020-12-30 MED ORDER — ACETAMINOPHEN 325 MG PO TABS
650.0000 mg | ORAL_TABLET | Freq: Once | ORAL | Status: AC
  Administered 2020-12-30: 650 mg via ORAL
  Filled 2020-12-30: qty 2

## 2020-12-30 MED ORDER — HEPARIN SOD (PORK) LOCK FLUSH 100 UNIT/ML IV SOLN: 500.0000 [IU] | Freq: Every day | INTRAVENOUS | Status: DC | PRN

## 2020-12-30 MED ORDER — SODIUM CHLORIDE 0.9 % IV SOLN
150.0000 mg | Freq: Once | INTRAVENOUS | Status: AC
  Administered 2020-12-30: 150 mg via INTRAVENOUS
  Filled 2020-12-30: qty 150

## 2020-12-30 MED ORDER — SODIUM CHLORIDE 0.9 % IV SOLN
Freq: Once | INTRAVENOUS | Status: AC
Start: 2020-12-30 — End: 2020-12-30

## 2020-12-30 MED ORDER — CYANOCOBALAMIN 1000 MCG/ML IJ SOLN: 1000.0000 ug | Freq: Once | INTRAMUSCULAR | Status: DC

## 2020-12-30 NOTE — Patient Instructions (Signed)
[facility]  Discharge Instructions: Thank you for choosing [facility] to provide your oncology and hematology care.   If you have a lab appointment with the [facility], please go directly to the [facility] and check in at the registration area.   Wear comfortable clothing and clothing appropriate for easy access to any Portacath or PICC line.   We strive to give you quality time with your provider. You may need to reschedule your appointment if you arrive late (15 or more minutes).  Arriving late affects you and other patients whose appointments are after yours.  Also, if you miss three or more appointments without notifying the office, you may be dismissed from the clinic at the provider's discretion.      For prescription refill requests, have your pharmacy contact our office and allow 72 hours for refills to be completed.    Today you received the following chemotherapy and/or immunotherapy agents Keytruda, Alimta, carboplatin.       To help prevent nausea and vomiting after your treatment, we encourage you to take your nausea medication as directed.  BELOW ARE SYMPTOMS THAT SHOULD BE REPORTED IMMEDIATELY: *FEVER GREATER THAN 100.4 F (38 C) OR HIGHER *CHILLS OR SWEATING *NAUSEA AND VOMITING THAT IS NOT CONTROLLED WITH YOUR NAUSEA MEDICATION *UNUSUAL SHORTNESS OF BREATH *UNUSUAL BRUISING OR BLEEDING *URINARY PROBLEMS (pain or burning when urinating, or frequent urination) *BOWEL PROBLEMS (unusual diarrhea, constipation, pain near the anus) TENDERNESS IN MOUTH AND THROAT WITH OR WITHOUT PRESENCE OF ULCERS (sore throat, sores in mouth, or a toothache) UNUSUAL RASH, SWELLING OR PAIN  UNUSUAL VAGINAL DISCHARGE OR ITCHING   Items with * indicate a potential emergency and should be followed up as soon as possible or go to the Emergency Department if any problems should occur.  Please show the CHEMOTHERAPY ALERT CARD or IMMUNOTHERAPY  ALERT CARD at check-in to the Emergency Department and triage nurse.  Should you have questions after your visit or need to cancel or reschedule your appointment, please contact [facility]  Dept: [phone]  and follow the prompts.  Office hours are 8:00 a.m. to 4:30 p.m. Monday - Friday. Please note that voicemails left after 4:00 p.m. may not be returned until the following business day.  We are closed weekends and major holidays. You have access to a nurse at all times for urgent questions. Please call the main number to the clinic Dept: [phone] and follow the prompts.   For any non-urgent questions, you may also contact your provider using MyChart. We now offer e-Visits for anyone 52 and older to request care online for non-urgent symptoms. For details visit [email or web address].   Also download the MyChart app! Go to the app store, search "MyChart", open the app, select [facility], and log in with your MyChart username and password.  Due to Covid, a mask is required upon entering the hospital/clinic. If you do not have a mask, one will be given to you upon arrival. For doctor visits, patients may have 1 support person aged 29 or older with them. For treatment visits, patients cannot have anyone with them due to current Covid guidelines and our immunocompromised population.

## 2020-12-31 ENCOUNTER — Telehealth: Payer: Self-pay | Admitting: Physician Assistant

## 2020-12-31 LAB — BPAM RBC
Blood Product Expiration Date: 202211232359
ISSUE DATE / TIME: 202210261439
Unit Type and Rh: 9500

## 2020-12-31 LAB — TYPE AND SCREEN
ABO/RH(D): O NEG
Antibody Screen: NEGATIVE
Unit division: 0

## 2020-12-31 NOTE — Telephone Encounter (Signed)
Sch per 10/26 los, pt aware

## 2021-01-06 ENCOUNTER — Inpatient Hospital Stay: Payer: PPO

## 2021-01-06 ENCOUNTER — Other Ambulatory Visit: Payer: Self-pay

## 2021-01-06 ENCOUNTER — Inpatient Hospital Stay (HOSPITAL_BASED_OUTPATIENT_CLINIC_OR_DEPARTMENT_OTHER): Payer: PPO | Admitting: Physician Assistant

## 2021-01-06 ENCOUNTER — Telehealth: Payer: Self-pay | Admitting: Physician Assistant

## 2021-01-06 ENCOUNTER — Inpatient Hospital Stay: Payer: PPO | Attending: Physician Assistant

## 2021-01-06 ENCOUNTER — Other Ambulatory Visit: Payer: PPO

## 2021-01-06 ENCOUNTER — Other Ambulatory Visit: Payer: Self-pay | Admitting: Physician Assistant

## 2021-01-06 VITALS — BP 111/81 | HR 112 | Temp 98.7°F | Resp 18

## 2021-01-06 DIAGNOSIS — R531 Weakness: Secondary | ICD-10-CM | POA: Diagnosis not present

## 2021-01-06 DIAGNOSIS — R110 Nausea: Secondary | ICD-10-CM

## 2021-01-06 DIAGNOSIS — Z5189 Encounter for other specified aftercare: Secondary | ICD-10-CM | POA: Diagnosis not present

## 2021-01-06 DIAGNOSIS — E876 Hypokalemia: Secondary | ICD-10-CM | POA: Diagnosis not present

## 2021-01-06 DIAGNOSIS — Z5111 Encounter for antineoplastic chemotherapy: Secondary | ICD-10-CM | POA: Diagnosis not present

## 2021-01-06 DIAGNOSIS — R5383 Other fatigue: Secondary | ICD-10-CM | POA: Diagnosis not present

## 2021-01-06 DIAGNOSIS — C3491 Malignant neoplasm of unspecified part of right bronchus or lung: Secondary | ICD-10-CM

## 2021-01-06 DIAGNOSIS — D61810 Antineoplastic chemotherapy induced pancytopenia: Secondary | ICD-10-CM | POA: Insufficient documentation

## 2021-01-06 DIAGNOSIS — Z79899 Other long term (current) drug therapy: Secondary | ICD-10-CM | POA: Insufficient documentation

## 2021-01-06 DIAGNOSIS — C7972 Secondary malignant neoplasm of left adrenal gland: Secondary | ICD-10-CM | POA: Insufficient documentation

## 2021-01-06 DIAGNOSIS — Z5112 Encounter for antineoplastic immunotherapy: Secondary | ICD-10-CM | POA: Insufficient documentation

## 2021-01-06 DIAGNOSIS — K5900 Constipation, unspecified: Secondary | ICD-10-CM | POA: Insufficient documentation

## 2021-01-06 DIAGNOSIS — C3431 Malignant neoplasm of lower lobe, right bronchus or lung: Secondary | ICD-10-CM | POA: Insufficient documentation

## 2021-01-06 DIAGNOSIS — D649 Anemia, unspecified: Secondary | ICD-10-CM

## 2021-01-06 LAB — CBC WITH DIFFERENTIAL (CANCER CENTER ONLY)
Abs Immature Granulocytes: 0.01 10*3/uL (ref 0.00–0.07)
Basophils Absolute: 0 10*3/uL (ref 0.0–0.1)
Basophils Relative: 1 %
Eosinophils Absolute: 0 10*3/uL (ref 0.0–0.5)
Eosinophils Relative: 1 %
HCT: 27.9 % — ABNORMAL LOW (ref 39.0–52.0)
Hemoglobin: 9.2 g/dL — ABNORMAL LOW (ref 13.0–17.0)
Immature Granulocytes: 1 %
Lymphocytes Relative: 25 %
Lymphs Abs: 0.5 10*3/uL — ABNORMAL LOW (ref 0.7–4.0)
MCH: 30.2 pg (ref 26.0–34.0)
MCHC: 33 g/dL (ref 30.0–36.0)
MCV: 91.5 fL (ref 80.0–100.0)
Monocytes Absolute: 0.1 10*3/uL (ref 0.1–1.0)
Monocytes Relative: 5 %
Neutro Abs: 1.3 10*3/uL — ABNORMAL LOW (ref 1.7–7.7)
Neutrophils Relative %: 67 %
Platelet Count: 187 10*3/uL (ref 150–400)
RBC: 3.05 MIL/uL — ABNORMAL LOW (ref 4.22–5.81)
RDW: 17.2 % — ABNORMAL HIGH (ref 11.5–15.5)
WBC Count: 1.8 10*3/uL — ABNORMAL LOW (ref 4.0–10.5)
nRBC: 0 % (ref 0.0–0.2)

## 2021-01-06 LAB — CMP (CANCER CENTER ONLY)
ALT: 27 U/L (ref 0–44)
AST: 34 U/L (ref 15–41)
Albumin: 3.8 g/dL (ref 3.5–5.0)
Alkaline Phosphatase: 70 U/L (ref 38–126)
Anion gap: 13 (ref 5–15)
BUN: 29 mg/dL — ABNORMAL HIGH (ref 8–23)
CO2: 24 mmol/L (ref 22–32)
Calcium: 9.2 mg/dL (ref 8.9–10.3)
Chloride: 102 mmol/L (ref 98–111)
Creatinine: 1.11 mg/dL (ref 0.61–1.24)
GFR, Estimated: >60 mL/min (ref >60)
Glucose, Bld: 126 mg/dL — ABNORMAL HIGH (ref 70–99)
Potassium: 3.6 mmol/L (ref 3.5–5.1)
Sodium: 139 mmol/L (ref 135–145)
Total Bilirubin: 2 mg/dL — ABNORMAL HIGH (ref 0.3–1.2)
Total Protein: 7.7 g/dL (ref 6.5–8.1)

## 2021-01-06 LAB — FERRITIN: Ferritin: 3326 ng/mL — ABNORMAL HIGH (ref 24–336)

## 2021-01-06 LAB — IRON AND TIBC
Iron: 257 ug/dL — ABNORMAL HIGH (ref 42–163)
Saturation Ratios: 105 % — ABNORMAL HIGH (ref 20–55)
TIBC: 245 ug/dL (ref 202–409)
UIBC: UNDETERMINED ug/dL (ref 117–376)

## 2021-01-06 LAB — VITAMIN B12: Vitamin B-12: 1665 pg/mL — ABNORMAL HIGH (ref 180–914)

## 2021-01-06 LAB — SAMPLE TO BLOOD BANK

## 2021-01-06 LAB — FOLATE: Folate: 32.8 ng/mL (ref >5.9)

## 2021-01-06 MED ORDER — ONDANSETRON HCL 8 MG PO TABS
4.0000 mg | ORAL_TABLET | Freq: Once | ORAL | Status: AC
  Administered 2021-01-06: 8 mg via ORAL
  Filled 2021-01-06: qty 1

## 2021-01-06 NOTE — Patient Instructions (Signed)
If pain worsens call 911 or go to the emergency department.    We recommended a CT scan of your abdomen to evaluate your pain.   Take nausea medicine at home and drink plenty of water to help with bowel movements.

## 2021-01-06 NOTE — Telephone Encounter (Signed)
I called the patient's sister to see if the patient is taking an iron supplement.  His iron studies were performed today which is a elevated ferritin and iron and iron saturation.  I left a voicemail letting her know that he does not need to take iron if he is indeed taking this.  If she has any questions she is free to call us back.
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
## 2021-01-13 ENCOUNTER — Other Ambulatory Visit: Payer: Self-pay

## 2021-01-13 ENCOUNTER — Other Ambulatory Visit: Payer: Self-pay | Admitting: Physician Assistant

## 2021-01-13 ENCOUNTER — Inpatient Hospital Stay: Payer: PPO

## 2021-01-13 VITALS — BP 120/74 | HR 101 | Temp 98.7°F | Resp 18

## 2021-01-13 DIAGNOSIS — D649 Anemia, unspecified: Secondary | ICD-10-CM

## 2021-01-13 DIAGNOSIS — T451X5A Adverse effect of antineoplastic and immunosuppressive drugs, initial encounter: Secondary | ICD-10-CM

## 2021-01-13 DIAGNOSIS — Z5112 Encounter for antineoplastic immunotherapy: Secondary | ICD-10-CM | POA: Diagnosis not present

## 2021-01-13 DIAGNOSIS — E876 Hypokalemia: Secondary | ICD-10-CM | POA: Diagnosis not present

## 2021-01-13 DIAGNOSIS — D701 Agranulocytosis secondary to cancer chemotherapy: Secondary | ICD-10-CM

## 2021-01-13 DIAGNOSIS — Z5111 Encounter for antineoplastic chemotherapy: Secondary | ICD-10-CM | POA: Diagnosis not present

## 2021-01-13 DIAGNOSIS — R5383 Other fatigue: Secondary | ICD-10-CM | POA: Diagnosis not present

## 2021-01-13 DIAGNOSIS — R531 Weakness: Secondary | ICD-10-CM | POA: Diagnosis not present

## 2021-01-13 DIAGNOSIS — C3431 Malignant neoplasm of lower lobe, right bronchus or lung: Secondary | ICD-10-CM | POA: Diagnosis not present

## 2021-01-13 DIAGNOSIS — C7972 Secondary malignant neoplasm of left adrenal gland: Secondary | ICD-10-CM | POA: Diagnosis not present

## 2021-01-13 DIAGNOSIS — C3491 Malignant neoplasm of unspecified part of right bronchus or lung: Secondary | ICD-10-CM

## 2021-01-13 DIAGNOSIS — R110 Nausea: Secondary | ICD-10-CM | POA: Diagnosis not present

## 2021-01-13 DIAGNOSIS — D702 Other drug-induced agranulocytosis: Secondary | ICD-10-CM

## 2021-01-13 DIAGNOSIS — Z79899 Other long term (current) drug therapy: Secondary | ICD-10-CM | POA: Diagnosis not present

## 2021-01-13 DIAGNOSIS — Z5189 Encounter for other specified aftercare: Secondary | ICD-10-CM | POA: Diagnosis not present

## 2021-01-13 DIAGNOSIS — D709 Neutropenia, unspecified: Secondary | ICD-10-CM | POA: Insufficient documentation

## 2021-01-13 DIAGNOSIS — D61810 Antineoplastic chemotherapy induced pancytopenia: Secondary | ICD-10-CM | POA: Diagnosis not present

## 2021-01-13 DIAGNOSIS — K5900 Constipation, unspecified: Secondary | ICD-10-CM | POA: Diagnosis not present

## 2021-01-13 LAB — CMP (CANCER CENTER ONLY)
ALT: 19 U/L (ref 0–44)
AST: 20 U/L (ref 15–41)
Albumin: 3.7 g/dL (ref 3.5–5.0)
Alkaline Phosphatase: 71 U/L (ref 38–126)
Anion gap: 15 (ref 5–15)
BUN: 18 mg/dL (ref 8–23)
CO2: 23 mmol/L (ref 22–32)
Calcium: 9 mg/dL (ref 8.9–10.3)
Chloride: 102 mmol/L (ref 98–111)
Creatinine: 1.45 mg/dL — ABNORMAL HIGH (ref 0.61–1.24)
GFR, Estimated: 53 mL/min — ABNORMAL LOW (ref >60)
Glucose, Bld: 108 mg/dL — ABNORMAL HIGH (ref 70–99)
Potassium: 3.3 mmol/L — ABNORMAL LOW (ref 3.5–5.1)
Sodium: 140 mmol/L (ref 135–145)
Total Bilirubin: 1 mg/dL (ref 0.3–1.2)
Total Protein: 7.3 g/dL (ref 6.5–8.1)

## 2021-01-13 LAB — CBC WITH DIFFERENTIAL (CANCER CENTER ONLY)
Abs Immature Granulocytes: 0 10*3/uL (ref 0.00–0.07)
Basophils Absolute: 0 10*3/uL (ref 0.0–0.1)
Basophils Relative: 0 %
Eosinophils Absolute: 0 10*3/uL (ref 0.0–0.5)
Eosinophils Relative: 2 %
HCT: 20.4 % — ABNORMAL LOW (ref 39.0–52.0)
Hemoglobin: 6.7 g/dL — CL (ref 13.0–17.0)
Immature Granulocytes: 0 %
Lymphocytes Relative: 55 %
Lymphs Abs: 0.5 10*3/uL — ABNORMAL LOW (ref 0.7–4.0)
MCH: 29.8 pg (ref 26.0–34.0)
MCHC: 32.8 g/dL (ref 30.0–36.0)
MCV: 90.7 fL (ref 80.0–100.0)
Monocytes Absolute: 0.3 10*3/uL (ref 0.1–1.0)
Monocytes Relative: 29 %
Neutro Abs: 0.1 10*3/uL — CL (ref 1.7–7.7)
Neutrophils Relative %: 14 %
Platelet Count: 39 10*3/uL — ABNORMAL LOW (ref 150–400)
RBC: 2.25 MIL/uL — ABNORMAL LOW (ref 4.22–5.81)
RDW: 15.7 % — ABNORMAL HIGH (ref 11.5–15.5)
Smear Review: NORMAL
WBC Count: 1 10*3/uL — ABNORMAL LOW (ref 4.0–10.5)
nRBC: 0 % (ref 0.0–0.2)

## 2021-01-13 LAB — SAMPLE TO BLOOD BANK

## 2021-01-13 LAB — PREPARE RBC (CROSSMATCH)

## 2021-01-13 MED ORDER — FILGRASTIM-SNDZ 480 MCG/0.8ML IJ SOSY: 480.0000 ug | PREFILLED_SYRINGE | Freq: Once | INTRAMUSCULAR | Status: DC

## 2021-01-13 MED ORDER — FILGRASTIM-AAFI 480 MCG/0.8ML IJ SOSY
480.0000 ug | PREFILLED_SYRINGE | Freq: Once | INTRAMUSCULAR | Status: AC
  Administered 2021-01-13: 480 ug via SUBCUTANEOUS
  Filled 2021-01-13: qty 0.8

## 2021-01-14 ENCOUNTER — Inpatient Hospital Stay: Payer: PPO

## 2021-01-14 DIAGNOSIS — Z79899 Other long term (current) drug therapy: Secondary | ICD-10-CM | POA: Diagnosis not present

## 2021-01-14 DIAGNOSIS — C3431 Malignant neoplasm of lower lobe, right bronchus or lung: Secondary | ICD-10-CM | POA: Diagnosis not present

## 2021-01-14 DIAGNOSIS — Z5111 Encounter for antineoplastic chemotherapy: Secondary | ICD-10-CM | POA: Diagnosis not present

## 2021-01-14 DIAGNOSIS — Z5112 Encounter for antineoplastic immunotherapy: Secondary | ICD-10-CM | POA: Diagnosis not present

## 2021-01-14 DIAGNOSIS — E876 Hypokalemia: Secondary | ICD-10-CM | POA: Diagnosis not present

## 2021-01-14 DIAGNOSIS — C7972 Secondary malignant neoplasm of left adrenal gland: Secondary | ICD-10-CM | POA: Diagnosis not present

## 2021-01-14 DIAGNOSIS — R110 Nausea: Secondary | ICD-10-CM | POA: Diagnosis not present

## 2021-01-14 DIAGNOSIS — K5900 Constipation, unspecified: Secondary | ICD-10-CM | POA: Diagnosis not present

## 2021-01-14 DIAGNOSIS — D649 Anemia, unspecified: Secondary | ICD-10-CM

## 2021-01-14 DIAGNOSIS — Z5189 Encounter for other specified aftercare: Secondary | ICD-10-CM | POA: Diagnosis not present

## 2021-01-14 DIAGNOSIS — R531 Weakness: Secondary | ICD-10-CM | POA: Diagnosis not present

## 2021-01-14 DIAGNOSIS — D61810 Antineoplastic chemotherapy induced pancytopenia: Secondary | ICD-10-CM | POA: Diagnosis not present

## 2021-01-14 DIAGNOSIS — R5383 Other fatigue: Secondary | ICD-10-CM | POA: Diagnosis not present

## 2021-01-14 MED ORDER — DIPHENHYDRAMINE HCL 25 MG PO CAPS
25.0000 mg | ORAL_CAPSULE | Freq: Once | ORAL | Status: AC
  Administered 2021-01-14: 25 mg via ORAL
  Filled 2021-01-14: qty 1

## 2021-01-14 MED ORDER — SODIUM CHLORIDE 0.9% IV SOLUTION
250.0000 mL | Freq: Once | INTRAVENOUS | Status: AC
  Administered 2021-01-14: 250 mL via INTRAVENOUS

## 2021-01-14 MED ORDER — ACETAMINOPHEN 325 MG PO TABS
650.0000 mg | ORAL_TABLET | Freq: Once | ORAL | Status: AC
  Administered 2021-01-14: 650 mg via ORAL
  Filled 2021-01-14: qty 2

## 2021-01-14 NOTE — Progress Notes (Signed)
Pt tolerated prbc transfusion well. Advised to call office with any concerns. Pt verbalized understanding. Pt was transported out of facility in w/c.

## 2021-01-15 LAB — TYPE AND SCREEN
ABO/RH(D): O NEG
Antibody Screen: NEGATIVE
Unit division: 0
Unit division: 0

## 2021-01-15 LAB — BPAM RBC
Blood Product Expiration Date: 202212072359
Blood Product Expiration Date: 202212082359
ISSUE DATE / TIME: 202211100949
ISSUE DATE / TIME: 202211100949
Unit Type and Rh: 9500
Unit Type and Rh: 9500
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
## 2021-01-20 ENCOUNTER — Inpatient Hospital Stay: Payer: PPO

## 2021-01-20 ENCOUNTER — Inpatient Hospital Stay (HOSPITAL_BASED_OUTPATIENT_CLINIC_OR_DEPARTMENT_OTHER): Payer: PPO | Admitting: Physician Assistant

## 2021-01-20 ENCOUNTER — Other Ambulatory Visit: Payer: Self-pay

## 2021-01-20 VITALS — BP 107/64 | HR 82 | Temp 97.7°F | Resp 18 | Wt 223.8 lb

## 2021-01-20 DIAGNOSIS — Z5112 Encounter for antineoplastic immunotherapy: Secondary | ICD-10-CM

## 2021-01-20 DIAGNOSIS — D61810 Antineoplastic chemotherapy induced pancytopenia: Secondary | ICD-10-CM | POA: Diagnosis not present

## 2021-01-20 DIAGNOSIS — M150 Primary generalized (osteo)arthritis: Secondary | ICD-10-CM | POA: Diagnosis not present

## 2021-01-20 DIAGNOSIS — C3431 Malignant neoplasm of lower lobe, right bronchus or lung: Secondary | ICD-10-CM | POA: Diagnosis not present

## 2021-01-20 DIAGNOSIS — C7972 Secondary malignant neoplasm of left adrenal gland: Secondary | ICD-10-CM | POA: Diagnosis not present

## 2021-01-20 DIAGNOSIS — C3491 Malignant neoplasm of unspecified part of right bronchus or lung: Secondary | ICD-10-CM

## 2021-01-20 DIAGNOSIS — E876 Hypokalemia: Secondary | ICD-10-CM | POA: Diagnosis not present

## 2021-01-20 DIAGNOSIS — Z79891 Long term (current) use of opiate analgesic: Secondary | ICD-10-CM | POA: Diagnosis not present

## 2021-01-20 DIAGNOSIS — Z79899 Other long term (current) drug therapy: Secondary | ICD-10-CM | POA: Diagnosis not present

## 2021-01-20 DIAGNOSIS — G894 Chronic pain syndrome: Secondary | ICD-10-CM | POA: Diagnosis not present

## 2021-01-20 DIAGNOSIS — D649 Anemia, unspecified: Secondary | ICD-10-CM

## 2021-01-20 DIAGNOSIS — R5383 Other fatigue: Secondary | ICD-10-CM | POA: Diagnosis not present

## 2021-01-20 DIAGNOSIS — R110 Nausea: Secondary | ICD-10-CM | POA: Diagnosis not present

## 2021-01-20 DIAGNOSIS — R531 Weakness: Secondary | ICD-10-CM | POA: Diagnosis not present

## 2021-01-20 DIAGNOSIS — Z5189 Encounter for other specified aftercare: Secondary | ICD-10-CM | POA: Diagnosis not present

## 2021-01-20 DIAGNOSIS — G893 Neoplasm related pain (acute) (chronic): Secondary | ICD-10-CM | POA: Diagnosis not present

## 2021-01-20 DIAGNOSIS — Z5111 Encounter for antineoplastic chemotherapy: Secondary | ICD-10-CM

## 2021-01-20 DIAGNOSIS — K5900 Constipation, unspecified: Secondary | ICD-10-CM | POA: Diagnosis not present

## 2021-01-20 LAB — CMP (CANCER CENTER ONLY)
ALT: 22 U/L (ref 0–44)
AST: 26 U/L (ref 15–41)
Albumin: 3.5 g/dL (ref 3.5–5.0)
Alkaline Phosphatase: 64 U/L (ref 38–126)
Anion gap: 12 (ref 5–15)
BUN: 10 mg/dL (ref 8–23)
CO2: 23 mmol/L (ref 22–32)
Calcium: 8.6 mg/dL — ABNORMAL LOW (ref 8.9–10.3)
Chloride: 107 mmol/L (ref 98–111)
Creatinine: 1.12 mg/dL (ref 0.61–1.24)
GFR, Estimated: >60 mL/min (ref >60)
Glucose, Bld: 113 mg/dL — ABNORMAL HIGH (ref 70–99)
Potassium: 3 mmol/L — ABNORMAL LOW (ref 3.5–5.1)
Sodium: 142 mmol/L (ref 135–145)
Total Bilirubin: 0.7 mg/dL (ref 0.3–1.2)
Total Protein: 6.7 g/dL (ref 6.5–8.1)

## 2021-01-20 LAB — CBC WITH DIFFERENTIAL (CANCER CENTER ONLY)
Abs Immature Granulocytes: 0.03 10*3/uL (ref 0.00–0.07)
Basophils Absolute: 0 10*3/uL (ref 0.0–0.1)
Basophils Relative: 0 %
Eosinophils Absolute: 0.1 10*3/uL (ref 0.0–0.5)
Eosinophils Relative: 2 %
HCT: 27.4 % — ABNORMAL LOW (ref 39.0–52.0)
Hemoglobin: 9.4 g/dL — ABNORMAL LOW (ref 13.0–17.0)
Immature Granulocytes: 1 %
Lymphocytes Relative: 25 %
Lymphs Abs: 0.8 10*3/uL (ref 0.7–4.0)
MCH: 31.8 pg (ref 26.0–34.0)
MCHC: 34.3 g/dL (ref 30.0–36.0)
MCV: 92.6 fL (ref 80.0–100.0)
Monocytes Absolute: 0.5 10*3/uL (ref 0.1–1.0)
Monocytes Relative: 16 %
Neutro Abs: 1.9 10*3/uL (ref 1.7–7.7)
Neutrophils Relative %: 56 %
Platelet Count: 180 10*3/uL (ref 150–400)
RBC: 2.96 MIL/uL — ABNORMAL LOW (ref 4.22–5.81)
RDW: 16.7 % — ABNORMAL HIGH (ref 11.5–15.5)
WBC Count: 3.3 10*3/uL — ABNORMAL LOW (ref 4.0–10.5)
nRBC: 0 % (ref 0.0–0.2)

## 2021-01-20 LAB — TSH: TSH: 1.86 u[IU]/mL (ref 0.320–4.118)

## 2021-01-20 LAB — SAMPLE TO BLOOD BANK

## 2021-01-20 MED ORDER — SODIUM CHLORIDE 0.9 % IV SOLN: Freq: Once | INTRAVENOUS | Status: AC

## 2021-01-20 MED ORDER — PROCHLORPERAZINE MALEATE 10 MG PO TABS
10.0000 mg | ORAL_TABLET | Freq: Once | ORAL | Status: AC
  Administered 2021-01-20: 10 mg via ORAL
  Filled 2021-01-20: qty 1

## 2021-01-20 MED ORDER — POTASSIUM CHLORIDE CRYS ER 20 MEQ PO TBCR: 20.0000 meq | EXTENDED_RELEASE_TABLET | Freq: Every day | ORAL | 0 refills | Status: DC

## 2021-01-20 MED ORDER — SODIUM CHLORIDE 0.9 % IV SOLN
400.0000 mg/m2 | Freq: Once | INTRAVENOUS | Status: AC
  Administered 2021-01-20: 900 mg via INTRAVENOUS
  Filled 2021-01-20: qty 20

## 2021-01-20 MED ORDER — PROCHLORPERAZINE MALEATE 10 MG PO TABS: 10.0000 mg | ORAL_TABLET | Freq: Four times a day (QID) | ORAL | 2 refills | Status: DC | PRN

## 2021-01-20 MED ORDER — SODIUM CHLORIDE 0.9 % IV SOLN
200.0000 mg | Freq: Once | INTRAVENOUS | Status: AC
  Administered 2021-01-20: 200 mg via INTRAVENOUS
  Filled 2021-01-20: qty 8

## 2021-01-20 MED ORDER — PROCHLORPERAZINE MALEATE 10 MG PO TABS
10.0000 mg | ORAL_TABLET | Freq: Four times a day (QID) | ORAL | 2 refills | Status: DC | PRN
Start: 2021-01-20 — End: 2021-02-13

## 2021-01-20 NOTE — Patient Instructions (Signed)
[facility]   Discharge Instructions: Thank you for choosing [facility] to provide your oncology and hematology care.   If you have a lab appointment with the [facility], please go directly to the [facility] and check in at the registration area.   Wear comfortable clothing and clothing appropriate for easy access to any Portacath or PICC line.   We strive to give you quality time with your provider. You may need to reschedule your appointment if you arrive late (15 or more minutes).  Arriving late affects you and other patients whose appointments are after yours.  Also, if you miss three or more appointments without notifying the office, you may be dismissed from the clinic at the provider's discretion.      For prescription refill requests, have your pharmacy contact our office and allow 72 hours for refills to be completed.    Today you received the following chemotherapy and/or immunotherapy agents: pembrolizumab and pemetrexed.      To help prevent nausea and vomiting after your treatment, we encourage you to take your nausea medication as directed.  BELOW ARE SYMPTOMS THAT SHOULD BE REPORTED IMMEDIATELY: *FEVER GREATER THAN 100.4 F (38 C) OR HIGHER *CHILLS OR SWEATING *NAUSEA AND VOMITING THAT IS NOT CONTROLLED WITH YOUR NAUSEA MEDICATION *UNUSUAL SHORTNESS OF BREATH *UNUSUAL BRUISING OR BLEEDING *URINARY PROBLEMS (pain or burning when urinating, or frequent urination) *BOWEL PROBLEMS (unusual diarrhea, constipation, pain near the anus) TENDERNESS IN MOUTH AND THROAT WITH OR WITHOUT PRESENCE OF ULCERS (sore throat, sores in mouth, or a toothache) UNUSUAL RASH, SWELLING OR PAIN  UNUSUAL VAGINAL DISCHARGE OR ITCHING   Items with * indicate a potential emergency and should be followed up as soon as possible or go to the Emergency Department if any problems should occur.  Please show the CHEMOTHERAPY ALERT CARD or IMMUNOTHERAPY  ALERT CARD at check-in to the Emergency Department and triage nurse.  Should you have questions after your visit or need to cancel or reschedule your appointment, please contact [facility]  Dept: [phone]  and follow the prompts.  Office hours are 8:00 a.m. to 4:30 p.m. Monday - Friday. Please note that voicemails left after 4:00 p.m. may not be returned until the following business day.  We are closed weekends and major holidays. You have access to a nurse at all times for urgent questions. Please call the main number to the clinic Dept: [phone] and follow the prompts.   For any non-urgent questions, you may also contact your provider using MyChart. We now offer e-Visits for anyone 58 and older to request care online for non-urgent symptoms. For details visit [email or web address].   Also download the MyChart app! Go to the app store, search "MyChart", open the app, select [facility], and log in with your MyChart username and password.  Due to Covid, a mask is required upon entering the hospital/clinic. If you do not have a mask, one will be given to you upon arrival. For doctor visits, patients may have 1 support person aged 35 or older with them. For treatment visits, patients cannot have anyone with them due to current Covid guidelines and our immunocompromised population.

## 2021-01-27 ENCOUNTER — Other Ambulatory Visit: Payer: Self-pay | Admitting: Physician Assistant

## 2021-01-27 ENCOUNTER — Inpatient Hospital Stay: Payer: PPO

## 2021-01-27 ENCOUNTER — Other Ambulatory Visit: Payer: Self-pay

## 2021-01-27 DIAGNOSIS — D649 Anemia, unspecified: Secondary | ICD-10-CM

## 2021-01-27 DIAGNOSIS — Z5112 Encounter for antineoplastic immunotherapy: Secondary | ICD-10-CM | POA: Diagnosis not present

## 2021-01-27 DIAGNOSIS — E876 Hypokalemia: Secondary | ICD-10-CM | POA: Diagnosis not present

## 2021-01-27 DIAGNOSIS — Z5111 Encounter for antineoplastic chemotherapy: Secondary | ICD-10-CM | POA: Diagnosis not present

## 2021-01-27 DIAGNOSIS — Z5189 Encounter for other specified aftercare: Secondary | ICD-10-CM | POA: Diagnosis not present

## 2021-01-27 DIAGNOSIS — Z79899 Other long term (current) drug therapy: Secondary | ICD-10-CM | POA: Diagnosis not present

## 2021-01-27 DIAGNOSIS — C7972 Secondary malignant neoplasm of left adrenal gland: Secondary | ICD-10-CM | POA: Diagnosis not present

## 2021-01-27 DIAGNOSIS — R110 Nausea: Secondary | ICD-10-CM | POA: Diagnosis not present

## 2021-01-27 DIAGNOSIS — R5383 Other fatigue: Secondary | ICD-10-CM | POA: Diagnosis not present

## 2021-01-27 DIAGNOSIS — R531 Weakness: Secondary | ICD-10-CM | POA: Diagnosis not present

## 2021-01-27 DIAGNOSIS — C3431 Malignant neoplasm of lower lobe, right bronchus or lung: Secondary | ICD-10-CM | POA: Diagnosis not present

## 2021-01-27 DIAGNOSIS — K5900 Constipation, unspecified: Secondary | ICD-10-CM | POA: Diagnosis not present

## 2021-01-27 DIAGNOSIS — R7989 Other specified abnormal findings of blood chemistry: Secondary | ICD-10-CM

## 2021-01-27 DIAGNOSIS — D61810 Antineoplastic chemotherapy induced pancytopenia: Secondary | ICD-10-CM | POA: Diagnosis not present

## 2021-01-27 DIAGNOSIS — C3491 Malignant neoplasm of unspecified part of right bronchus or lung: Secondary | ICD-10-CM

## 2021-01-27 LAB — SAMPLE TO BLOOD BANK

## 2021-01-27 LAB — CBC WITH DIFFERENTIAL (CANCER CENTER ONLY)
Abs Immature Granulocytes: 0.05 10*3/uL (ref 0.00–0.07)
Basophils Absolute: 0 10*3/uL (ref 0.0–0.1)
Basophils Relative: 1 %
Eosinophils Absolute: 0 10*3/uL (ref 0.0–0.5)
Eosinophils Relative: 1 %
HCT: 19.8 % — ABNORMAL LOW (ref 39.0–52.0)
Hemoglobin: 6.5 g/dL — CL (ref 13.0–17.0)
Immature Granulocytes: 4 %
Lymphocytes Relative: 28 %
Lymphs Abs: 0.4 10*3/uL — ABNORMAL LOW (ref 0.7–4.0)
MCH: 31.3 pg (ref 26.0–34.0)
MCHC: 32.8 g/dL (ref 30.0–36.0)
MCV: 95.2 fL (ref 80.0–100.0)
Monocytes Absolute: 0.1 10*3/uL (ref 0.1–1.0)
Monocytes Relative: 5 %
Neutro Abs: 0.8 10*3/uL — ABNORMAL LOW (ref 1.7–7.7)
Neutrophils Relative %: 61 %
Platelet Count: 105 10*3/uL — ABNORMAL LOW (ref 150–400)
RBC: 2.08 MIL/uL — ABNORMAL LOW (ref 4.22–5.81)
RDW: 17.4 % — ABNORMAL HIGH (ref 11.5–15.5)
Smear Review: NORMAL
WBC Count: 1.4 10*3/uL — ABNORMAL LOW (ref 4.0–10.5)
nRBC: 0 % (ref 0.0–0.2)

## 2021-01-27 LAB — CMP (CANCER CENTER ONLY)
ALT: 22 U/L (ref 0–44)
AST: 81 U/L — ABNORMAL HIGH (ref 15–41)
Albumin: 3.3 g/dL — ABNORMAL LOW (ref 3.5–5.0)
Alkaline Phosphatase: 52 U/L (ref 38–126)
Anion gap: 12 (ref 5–15)
BUN: 35 mg/dL — ABNORMAL HIGH (ref 8–23)
CO2: 23 mmol/L (ref 22–32)
Calcium: 8.8 mg/dL — ABNORMAL LOW (ref 8.9–10.3)
Chloride: 103 mmol/L (ref 98–111)
Creatinine: 2.71 mg/dL — ABNORMAL HIGH (ref 0.61–1.24)
GFR, Estimated: 25 mL/min — ABNORMAL LOW (ref >60)
Glucose, Bld: 118 mg/dL — ABNORMAL HIGH (ref 70–99)
Potassium: 3.5 mmol/L (ref 3.5–5.1)
Sodium: 138 mmol/L (ref 135–145)
Total Bilirubin: 1.5 mg/dL — ABNORMAL HIGH (ref 0.3–1.2)
Total Protein: 7.1 g/dL (ref 6.5–8.1)

## 2021-01-27 LAB — PREPARE RBC (CROSSMATCH)

## 2021-01-27 MED ORDER — SODIUM CHLORIDE 0.9% IV SOLUTION
250.0000 mL | Freq: Once | INTRAVENOUS | Status: AC
  Administered 2021-01-27: 250 mL via INTRAVENOUS

## 2021-01-27 MED ORDER — DIPHENHYDRAMINE HCL 25 MG PO CAPS
25.0000 mg | ORAL_CAPSULE | Freq: Once | ORAL | Status: AC
  Administered 2021-01-27: 25 mg via ORAL
  Filled 2021-01-27: qty 1

## 2021-01-27 MED ORDER — ACETAMINOPHEN 325 MG PO TABS
650.0000 mg | ORAL_TABLET | Freq: Once | ORAL | Status: AC
  Administered 2021-01-27: 650 mg via ORAL
  Filled 2021-01-27: qty 2

## 2021-01-27 MED ORDER — SODIUM CHLORIDE 0.9 % IV SOLN: Freq: Once | INTRAVENOUS | Status: AC

## 2021-01-27 NOTE — Progress Notes (Signed)
Despite starting maintenance treatment with immunotherapy with Keytruda and chemo dose reduced Alimta 400 mg/m2, the patient continues to have significant cytopenias. We will arrange for him to receive 2 units of blood total this week. We will also arrange for two days of IVF with normal saline for his elevated creatinine. Orders under sign and hold for IVF today and Friday. Moving forward, we will permanently discontinue Alimta from the patient's treatment plan.

## 2021-01-29 ENCOUNTER — Inpatient Hospital Stay: Payer: PPO

## 2021-01-29 ENCOUNTER — Other Ambulatory Visit: Payer: Self-pay | Admitting: *Deleted

## 2021-01-29 ENCOUNTER — Other Ambulatory Visit: Payer: Self-pay

## 2021-01-29 DIAGNOSIS — D61810 Antineoplastic chemotherapy induced pancytopenia: Secondary | ICD-10-CM | POA: Diagnosis not present

## 2021-01-29 DIAGNOSIS — Z5111 Encounter for antineoplastic chemotherapy: Secondary | ICD-10-CM | POA: Diagnosis not present

## 2021-01-29 DIAGNOSIS — R7989 Other specified abnormal findings of blood chemistry: Secondary | ICD-10-CM

## 2021-01-29 DIAGNOSIS — D649 Anemia, unspecified: Secondary | ICD-10-CM

## 2021-01-29 DIAGNOSIS — K5900 Constipation, unspecified: Secondary | ICD-10-CM | POA: Diagnosis not present

## 2021-01-29 DIAGNOSIS — Z79899 Other long term (current) drug therapy: Secondary | ICD-10-CM | POA: Diagnosis not present

## 2021-01-29 DIAGNOSIS — R531 Weakness: Secondary | ICD-10-CM | POA: Diagnosis not present

## 2021-01-29 DIAGNOSIS — R5383 Other fatigue: Secondary | ICD-10-CM | POA: Diagnosis not present

## 2021-01-29 DIAGNOSIS — Z5112 Encounter for antineoplastic immunotherapy: Secondary | ICD-10-CM | POA: Diagnosis not present

## 2021-01-29 DIAGNOSIS — E876 Hypokalemia: Secondary | ICD-10-CM | POA: Diagnosis not present

## 2021-01-29 DIAGNOSIS — R110 Nausea: Secondary | ICD-10-CM | POA: Diagnosis not present

## 2021-01-29 DIAGNOSIS — C7972 Secondary malignant neoplasm of left adrenal gland: Secondary | ICD-10-CM | POA: Diagnosis not present

## 2021-01-29 DIAGNOSIS — C3431 Malignant neoplasm of lower lobe, right bronchus or lung: Secondary | ICD-10-CM | POA: Diagnosis not present

## 2021-01-29 DIAGNOSIS — Z5189 Encounter for other specified aftercare: Secondary | ICD-10-CM | POA: Diagnosis not present

## 2021-01-29 LAB — PREPARE RBC (CROSSMATCH)

## 2021-01-29 MED ORDER — DIPHENHYDRAMINE HCL 25 MG PO CAPS
25.0000 mg | ORAL_CAPSULE | Freq: Once | ORAL | Status: AC
  Administered 2021-01-29: 25 mg via ORAL
  Filled 2021-01-29: qty 1

## 2021-01-29 MED ORDER — SODIUM CHLORIDE 0.9 % IV SOLN: INTRAVENOUS | Status: DC

## 2021-01-29 MED ORDER — SODIUM CHLORIDE 0.9% IV SOLUTION
250.0000 mL | Freq: Once | INTRAVENOUS | Status: AC
  Administered 2021-01-29: 250 mL via INTRAVENOUS

## 2021-01-29 MED ORDER — ACETAMINOPHEN 325 MG PO TABS
650.0000 mg | ORAL_TABLET | Freq: Once | ORAL | Status: AC
  Administered 2021-01-29: 650 mg via ORAL
  Filled 2021-01-29: qty 2

## 2021-01-29 NOTE — Progress Notes (Signed)
Pt tolerated 1 unit of blood and 1 liter of fluids well. Transported pt out of facility in w/c. Advised to call staff with concerns. Pt verbalized understanding.

## 2021-01-29 NOTE — Patient Instructions (Signed)
Blood Transfusion, Adult, Care After This sheet gives you information about how to care for yourself after your procedure. Your doctor may also give you more specific instructions. If you have problems or questions, contact your doctor. What can I expect after the procedure? After the procedure, it is common to have: Bruising and soreness at the IV site. A headache. Follow these instructions at home: Insertion site care   Follow instructions from your doctor about how to take care of your insertion site. This is where an IV tube was put into your vein. Make sure you: Wash your hands with soap and water before and after you change your bandage (dressing). If you cannot use soap and water, use hand sanitizer. Change your bandage as told by your doctor. Check your insertion site every day for signs of infection. Check for: Redness, swelling, or pain. Bleeding from the site. Warmth. Pus or a bad smell. General instructions Take over-the-counter and prescription medicines only as told by your doctor. Rest as told by your doctor. Go back to your normal activities as told by your doctor. Keep all follow-up visits as told by your doctor. This is important. Contact a doctor if: You have itching or red, swollen areas of skin (hives). You feel worried or nervous (anxious). You feel weak after doing your normal activities. You have redness, swelling, warmth, or pain around the insertion site. You have blood coming from the insertion site, and the blood does not stop with pressure. You have pus or a bad smell coming from the insertion site. Get help right away if: You have signs of a serious reaction. This may be coming from an allergy or the body's defense system (immune system). Signs include: Trouble breathing or shortness of breath. Swelling of the face or feeling warm (flushed). Fever or chills. Head, chest, or back pain. Dark pee (urine) or blood in the pee. Widespread rash. Fast  heartbeat. Feeling dizzy or light-headed. You may receive your blood transfusion in an outpatient setting. If so, you will be told whom to contact to report any reactions. These symptoms may be an emergency. Do not wait to see if the symptoms will go away. Get medical help right away. Call your local emergency services (911 in the [place] Do not drive yourself to the hospital. Summary Bruising and soreness at the IV site are common. Check your insertion site every day for signs of infection. Rest as told by your doctor. Go back to your normal activities as told by your doctor. Get help right away if you have signs of a serious reaction. This information is not intended to replace advice given to you by your health care provider. Make sure you discuss any questions you have with your health care provider. Document Revised: 06/18/2020 Document Reviewed: 08/16/2018 Elsevier Patient Education  2022 [employer]  

## 2021-02-01 ENCOUNTER — Other Ambulatory Visit: Payer: Self-pay | Admitting: Radiology

## 2021-02-01 ENCOUNTER — Other Ambulatory Visit (HOSPITAL_COMMUNITY): Payer: Self-pay | Admitting: Physician Assistant

## 2021-02-01 LAB — BPAM RBC
Blood Product Expiration Date: 202212172359
Blood Product Expiration Date: 202212172359
ISSUE DATE / TIME: 202211231513
ISSUE DATE / TIME: 202211251041
Unit Type and Rh: 9500
Unit Type and Rh: 9500

## 2021-02-01 LAB — TYPE AND SCREEN
ABO/RH(D): O NEG
Antibody Screen: NEGATIVE
Unit division: 0
Unit division: 0
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
## 2021-02-02 ENCOUNTER — Ambulatory Visit (HOSPITAL_COMMUNITY)
Admission: RE | Admit: 2021-02-02 | Discharge: 2021-02-02 | Disposition: A | Discharge status: Home / Self Care | Payer: PPO | Source: Ambulatory Visit | Attending: Physician Assistant | Admitting: Physician Assistant

## 2021-02-02 ENCOUNTER — Encounter (HOSPITAL_COMMUNITY): Payer: Self-pay

## 2021-02-02 ENCOUNTER — Other Ambulatory Visit: Payer: Self-pay

## 2021-02-02 DIAGNOSIS — I2510 Atherosclerotic heart disease of native coronary artery without angina pectoris: Secondary | ICD-10-CM | POA: Insufficient documentation

## 2021-02-02 DIAGNOSIS — I10 Essential (primary) hypertension: Secondary | ICD-10-CM | POA: Insufficient documentation

## 2021-02-02 DIAGNOSIS — E785 Hyperlipidemia, unspecified: Secondary | ICD-10-CM | POA: Diagnosis not present

## 2021-02-02 DIAGNOSIS — F419 Anxiety disorder, unspecified: Secondary | ICD-10-CM | POA: Diagnosis not present

## 2021-02-02 DIAGNOSIS — Z452 Encounter for adjustment and management of vascular access device: Secondary | ICD-10-CM | POA: Diagnosis not present

## 2021-02-02 DIAGNOSIS — C3490 Malignant neoplasm of unspecified part of unspecified bronchus or lung: Secondary | ICD-10-CM | POA: Diagnosis not present

## 2021-02-02 DIAGNOSIS — K219 Gastro-esophageal reflux disease without esophagitis: Secondary | ICD-10-CM | POA: Diagnosis not present

## 2021-02-02 DIAGNOSIS — J449 Chronic obstructive pulmonary disease, unspecified: Secondary | ICD-10-CM | POA: Insufficient documentation

## 2021-02-02 DIAGNOSIS — C3491 Malignant neoplasm of unspecified part of right bronchus or lung: Secondary | ICD-10-CM

## 2021-02-02 DIAGNOSIS — M797 Fibromyalgia: Secondary | ICD-10-CM | POA: Diagnosis not present

## 2021-02-02 HISTORY — PX: IR IMAGING GUIDED PORT INSERTION: IMG5740

## 2021-02-02 IMAGING — XA IR IMAGING GUIDED PORT INSERTION
1 series · 1 of 1 positions shown · non-contrast
Comparison: none

CLINICAL DATA: non-small cell lung carcinoma, needs durable venous
access for planned treatment regimen
TECHNIQUE: The procedure, risks, benefits, and alternatives were explained to
the patient. Questions regarding the procedure were encouraged and
answered. The patient understands and consents to the procedure.

[Series 1: ir fluoro/shunt/fist · 1 of 1 slices shown]
[im 1/1]
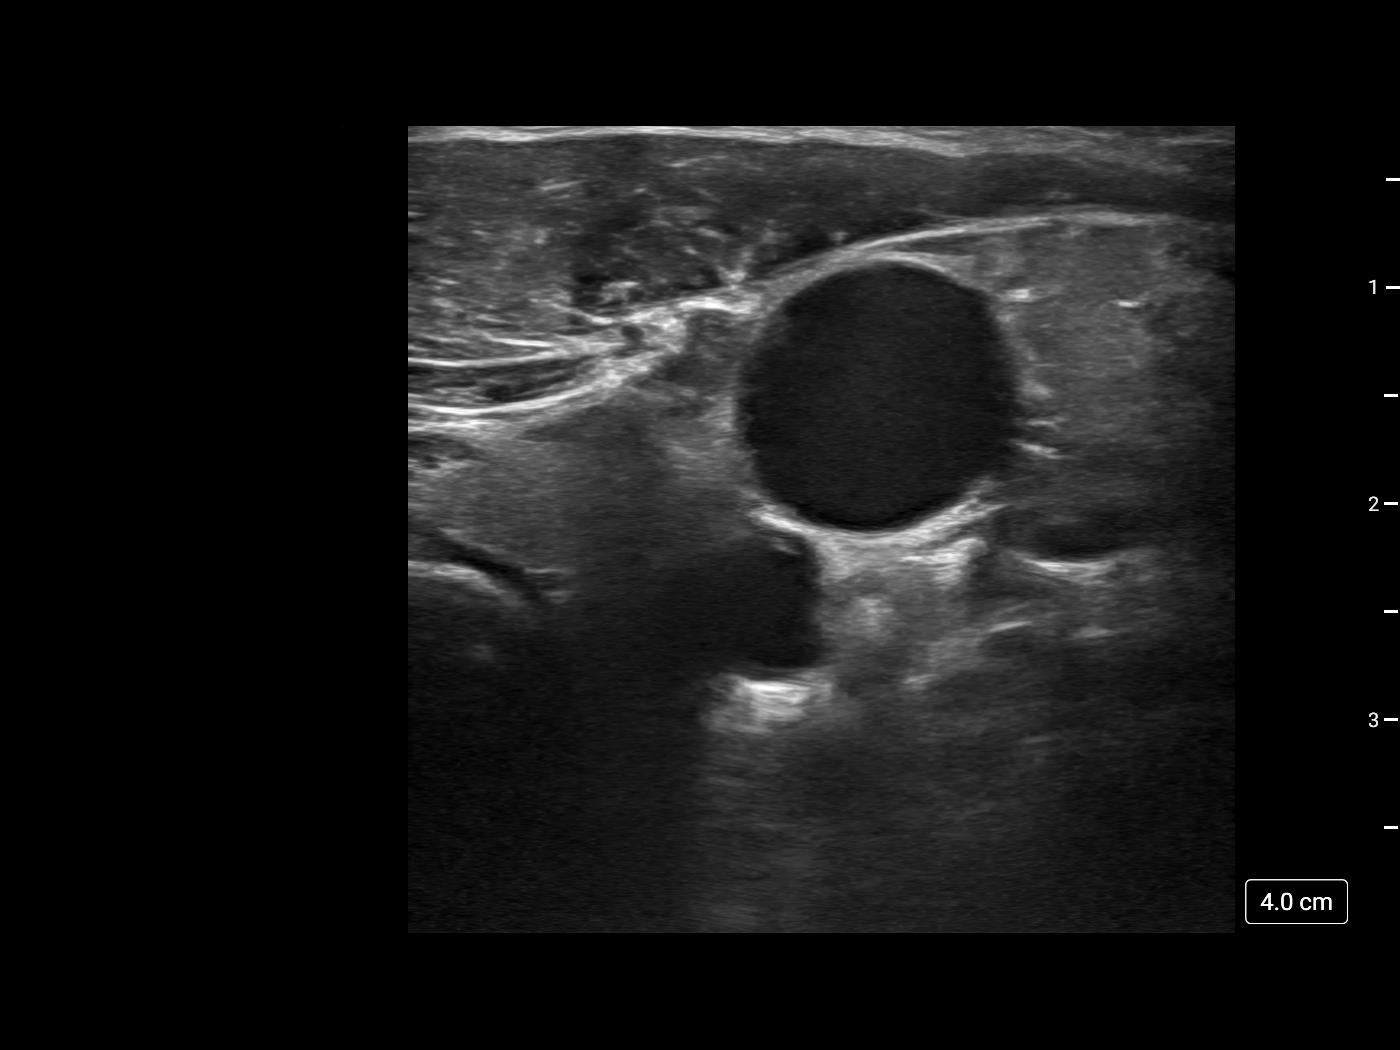

[1 of 1 positions shown; findings below may reference images not displayed]

EXAM:
TUNNELED PORT CATHETER PLACEMENT WITH ULTRASOUND AND FLUOROSCOPIC
GUIDANCE

FLUOROSCOPY TIME:  seconds

ANESTHESIA/SEDATION:
Intravenous Fentanyl [UL] and Versed 2mg were administered as
conscious sedation during continuous monitoring of the patient's
level of consciousness and physiological / cardiorespiratory status
by the radiology RN, with a total moderate sedation time of 15
minutes.
Patency of the right IJ vein was confirmed with ultrasound with
image documentation. An appropriate skin site was determined. Skin
site was marked. Region was prepped using maximum barrier technique
including cap and mask, sterile gown, sterile gloves, large sterile
sheet, and Chlorhexidine as cutaneous antisepsis. The region was
infiltrated locally with 1% lidocaine. Under real-time ultrasound
guidance, the right IJ vein was accessed with a 21 gauge
micropuncture needle; the needle tip within the vein was confirmed
with ultrasound image documentation. Needle was exchanged over a 018
guidewire for transitional dilator, and vascular measurement was
performed.

A small incision was made on the right anterior chest wall and a
subcutaneous pocket fashioned. The power-injectable port was
positioned and its catheter tunneled to the right IJ dermatotomy
site. The transitional dilator was exchanged over an Amplatz wire
for a peel-away sheath, through which the port catheter, which had
been trimmed to the appropriate length, was advanced and positioned
under fluoroscopy with its tip at the cavoatrial junction. Spot
chest radiograph confirms good catheter position and no
pneumothorax. The port was flushed per protocol. The pocket was
closed with deep interrupted and subcuticular continuous 3-0
Monocryl sutures. The incisions were covered with Dermabond then
covered with a sterile dressing.

The patient tolerated the procedure well.

COMPLICATIONS:
COMPLICATIONS
None immediate
IMPRESSION: Technically successful right IJ power-injectable port catheter
placement. Ready for routine use.

## 2021-02-02 MED ORDER — FENTANYL CITRATE (PF) 100 MCG/2ML IJ SOLN
INTRAMUSCULAR | Status: AC
  Filled 2021-02-02: qty 2

## 2021-02-02 MED ORDER — FENTANYL CITRATE (PF) 100 MCG/2ML IJ SOLN
INTRAMUSCULAR | Status: AC | PRN
  Administered 2021-02-02: 50 ug via INTRAVENOUS

## 2021-02-02 MED ORDER — HEPARIN SOD (PORK) LOCK FLUSH 100 UNIT/ML IV SOLN
INTRAVENOUS | Status: AC
  Filled 2021-02-02: qty 5

## 2021-02-02 MED ORDER — LIDOCAINE-EPINEPHRINE (PF) 2 %-1:200000 IJ SOLN
INTRAMUSCULAR | Status: AC | PRN
  Administered 2021-02-02: 10 mL via INTRADERMAL

## 2021-02-02 MED ORDER — SODIUM CHLORIDE 0.9 % IV SOLN: INTRAVENOUS | Status: DC

## 2021-02-02 MED ORDER — MIDAZOLAM HCL 2 MG/2ML IJ SOLN
INTRAMUSCULAR | Status: AC
  Filled 2021-02-02: qty 2

## 2021-02-02 MED ORDER — MIDAZOLAM HCL 2 MG/2ML IJ SOLN
INTRAMUSCULAR | Status: AC | PRN
  Administered 2021-02-02: 1 mg via INTRAVENOUS

## 2021-02-02 MED ORDER — HEPARIN SOD (PORK) LOCK FLUSH 100 UNIT/ML IV SOLN
INTRAVENOUS | Status: AC | PRN
  Administered 2021-02-02: 500 [IU] via INTRAVENOUS

## 2021-02-02 NOTE — Discharge Instructions (Signed)
For questions /concerns may call Interventional Radiology at [phone]  You may remove your dressing and shower tomorrow afternoon  DO NOT use EMLA cream for 2 weeks after port placement as the cream will remove surgical glue on your incision.      Implanted Port Insertion, Care After This sheet gives you information about how to care for yourself after your procedure. Your health care provider may also give you more specific instructions. If you have problems or questions, contact your health careprovider. What can I expect after the procedure? After the procedure, it is common to have: Discomfort at the port insertion site. Bruising on the skin over the port. This should improve over 3-4 days. Follow these instructions at home: [facility] care After your port is placed, you will get a manufacturer's information card. The card has information about your port. Keep this card with you at all times. Take care of the port as told by your health care provider. Ask your health care provider if you or a family member can get training for taking care of the port at home. A home health care nurse may also take care of the port. Make sure to remember what type of port you have. Incision care Follow instructions from your health care provider about how to take care of your port insertion site. Make sure you: Wash your hands with soap and water before and after you change your bandage (dressing). If soap and water are not available, use hand sanitizer. Change your dressing as told by your health care provider. Leave skin glue, or adhesive strips in place. These skin closures may need to stay in place for 2 weeks or longer.  Check your port insertion site every day for signs of infection. Check for:      - Redness, swelling, or pain.                     - Fluid or blood.      - Warmth.      - Pus or a bad smell. Activity Return to your normal activities as told by your health care provider. Ask your  health care provider what activities are safe for you. Do not lift anything that is heavier than 10 lb (4.5 kg), or the limit that you are told, until your health care provider says that it is safe. General instructions Take over-the-counter and prescription medicines only as told by your health care provider. Do not take baths, swim, or use a hot tub until your health care provider approves. Ask your health care provider if you may take showers. You may only be allowed to take sponge baths. Do not drive for 24 hours if you were given a sedative during your procedure. Wear a medical alert bracelet in case of an emergency. This will tell any health care providers that you have a port. Keep all follow-up visits as told by your health care provider. This is important. Contact a health care provider if: You cannot flush your port with saline as directed, or you cannot draw blood from the port. You have a fever or chills. You have redness, swelling, or pain around your port insertion site. You have fluid or blood coming from your port insertion site. Your port insertion site feels warm to the touch. You have pus or a bad smell coming from the port insertion site. Get help right away if: You have chest pain or shortness of breath. You have bleeding from  your port that you cannot control. Summary Take care of the port as told by your health care provider. Keep the manufacturer's information card with you at all times. Change your dressing as told by your health care provider. Contact a health care provider if you have a fever or chills or if you have redness, swelling, or pain around your port insertion site. Keep all follow-up visits as told by your health care provider. This information is not intended to replace advice given to you by your health care provider.  Make sure you discuss any questions you have with your healthcare provider.    Moderate Conscious Sedation, Adult, Care After This  sheet gives you information about how to care for yourself after your procedure. Your health care provider may also give you more specific instructions. If you have problems or questions, contact your health careprovider. What can I expect after the procedure? After the procedure, it is common to have: Sleepiness for several hours. Impaired judgment for several hours. Difficulty with balance. Vomiting if you eat too soon. Follow these instructions at home: For the time period you were told by your health care provider: Rest. Do not participate in activities where you could fall or become injured. Do not drive or use machinery. Do not drink alcohol. Do not take sleeping pills or medicines that cause drowsiness. Do not make important decisions or sign legal documents. Do not take care of children on your own. Eating and drinking  Follow the diet recommended by your health care provider. Drink enough fluid to keep your urine pale yellow. If you vomit: Drink water, juice, or soup when you can drink without vomiting. Make sure you have little or no nausea before eating solid foods.  General instructions Take over-the-counter and prescription medicines only as told by your health care provider. Have a responsible adult stay with you for the time you are told. It is important to have someone help care for you until you are awake and alert. Do not smoke. Keep all follow-up visits as told by your health care provider. This is important. Contact a health care provider if: You are still sleepy or having trouble with balance after 24 hours. You feel light-headed. You keep feeling nauseous or you keep vomiting. You develop a rash. You have a fever. You have redness or swelling around the IV site. Get help right away if: You have trouble breathing. You have new-onset confusion at home. Summary After the procedure, it is common to feel sleepy, have impaired judgment, or feel nauseous if you  eat too soon. Rest after you get home. Know the things you should not do after the procedure. Follow the diet recommended by your health care provider and drink enough fluid to keep your urine pale yellow. Get help right away if you have trouble breathing or new-onset confusion at home. This information is not intended to replace advice given to you by your health care provider. Make sure you discuss any questions you have with your healthcare provider. Document Revised: 06/21/2019 Document Reviewed: 01/17/2019 Elsevier Patient Education  2022 [employer].

## 2021-02-02 NOTE — Procedures (Signed)
  Procedure: R IJ PowerPort catheter placement   EBL:   minimal Complications:  none immediate  See full dictation in [facility].  Dillard Cannon MD Main # [phone] Pager  [phone] Mobile [phone]

## 2021-02-03 ENCOUNTER — Other Ambulatory Visit: Payer: Self-pay | Admitting: Physician Assistant

## 2021-02-03 ENCOUNTER — Other Ambulatory Visit: Payer: Self-pay

## 2021-02-03 ENCOUNTER — Encounter: Payer: Self-pay | Admitting: Internal Medicine

## 2021-02-03 ENCOUNTER — Inpatient Hospital Stay: Payer: PPO

## 2021-02-03 ENCOUNTER — Inpatient Hospital Stay (HOSPITAL_COMMUNITY)
Admission: EM | Admit: 2021-02-03 | Discharge: 2021-02-13 | DRG: 809 | Disposition: A | Discharge status: Home / Self Care | Payer: PPO | Attending: Family Medicine | Admitting: Family Medicine

## 2021-02-03 ENCOUNTER — Telehealth: Payer: Self-pay

## 2021-02-03 DIAGNOSIS — G893 Neoplasm related pain (acute) (chronic): Secondary | ICD-10-CM | POA: Diagnosis present

## 2021-02-03 DIAGNOSIS — M797 Fibromyalgia: Secondary | ICD-10-CM | POA: Diagnosis not present

## 2021-02-03 DIAGNOSIS — C7972 Secondary malignant neoplasm of left adrenal gland: Secondary | ICD-10-CM | POA: Diagnosis not present

## 2021-02-03 DIAGNOSIS — D61810 Antineoplastic chemotherapy induced pancytopenia: Secondary | ICD-10-CM | POA: Diagnosis not present

## 2021-02-03 DIAGNOSIS — Z955 Presence of coronary angioplasty implant and graft: Secondary | ICD-10-CM

## 2021-02-03 DIAGNOSIS — K5900 Constipation, unspecified: Secondary | ICD-10-CM | POA: Diagnosis not present

## 2021-02-03 DIAGNOSIS — E785 Hyperlipidemia, unspecified: Secondary | ICD-10-CM | POA: Diagnosis not present

## 2021-02-03 DIAGNOSIS — K839 Disease of biliary tract, unspecified: Secondary | ICD-10-CM

## 2021-02-03 DIAGNOSIS — R918 Other nonspecific abnormal finding of lung field: Secondary | ICD-10-CM | POA: Diagnosis present

## 2021-02-03 DIAGNOSIS — Z79899 Other long term (current) drug therapy: Secondary | ICD-10-CM | POA: Diagnosis not present

## 2021-02-03 DIAGNOSIS — I252 Old myocardial infarction: Secondary | ICD-10-CM | POA: Diagnosis not present

## 2021-02-03 DIAGNOSIS — Z9049 Acquired absence of other specified parts of digestive tract: Secondary | ICD-10-CM

## 2021-02-03 DIAGNOSIS — E876 Hypokalemia: Secondary | ICD-10-CM | POA: Diagnosis not present

## 2021-02-03 DIAGNOSIS — R531 Weakness: Secondary | ICD-10-CM | POA: Diagnosis present

## 2021-02-03 DIAGNOSIS — D61818 Other pancytopenia: Secondary | ICD-10-CM | POA: Diagnosis not present

## 2021-02-03 DIAGNOSIS — F32A Depression, unspecified: Secondary | ICD-10-CM | POA: Diagnosis present

## 2021-02-03 DIAGNOSIS — Z87891 Personal history of nicotine dependence: Secondary | ICD-10-CM

## 2021-02-03 DIAGNOSIS — E875 Hyperkalemia: Secondary | ICD-10-CM | POA: Diagnosis not present

## 2021-02-03 DIAGNOSIS — K921 Melena: Secondary | ICD-10-CM | POA: Diagnosis not present

## 2021-02-03 DIAGNOSIS — K219 Gastro-esophageal reflux disease without esophagitis: Secondary | ICD-10-CM | POA: Diagnosis not present

## 2021-02-03 DIAGNOSIS — D696 Thrombocytopenia, unspecified: Secondary | ICD-10-CM

## 2021-02-03 DIAGNOSIS — I700 Atherosclerosis of aorta: Secondary | ICD-10-CM | POA: Diagnosis not present

## 2021-02-03 DIAGNOSIS — N179 Acute kidney failure, unspecified: Secondary | ICD-10-CM | POA: Diagnosis not present

## 2021-02-03 DIAGNOSIS — L03116 Cellulitis of left lower limb: Secondary | ICD-10-CM | POA: Diagnosis not present

## 2021-02-03 DIAGNOSIS — Z5189 Encounter for other specified aftercare: Secondary | ICD-10-CM | POA: Diagnosis not present

## 2021-02-03 DIAGNOSIS — G894 Chronic pain syndrome: Secondary | ICD-10-CM | POA: Diagnosis present

## 2021-02-03 DIAGNOSIS — R911 Solitary pulmonary nodule: Secondary | ICD-10-CM | POA: Diagnosis not present

## 2021-02-03 DIAGNOSIS — E669 Obesity, unspecified: Secondary | ICD-10-CM | POA: Diagnosis present

## 2021-02-03 DIAGNOSIS — K8050 Calculus of bile duct without cholangitis or cholecystitis without obstruction: Secondary | ICD-10-CM

## 2021-02-03 DIAGNOSIS — I110 Hypertensive heart disease with heart failure: Secondary | ICD-10-CM | POA: Diagnosis present

## 2021-02-03 DIAGNOSIS — R590 Localized enlarged lymph nodes: Secondary | ICD-10-CM | POA: Diagnosis not present

## 2021-02-03 DIAGNOSIS — Z5111 Encounter for antineoplastic chemotherapy: Secondary | ICD-10-CM | POA: Diagnosis not present

## 2021-02-03 DIAGNOSIS — Z5112 Encounter for antineoplastic immunotherapy: Secondary | ICD-10-CM | POA: Diagnosis not present

## 2021-02-03 DIAGNOSIS — K2970 Gastritis, unspecified, without bleeding: Secondary | ICD-10-CM | POA: Diagnosis not present

## 2021-02-03 DIAGNOSIS — R110 Nausea: Secondary | ICD-10-CM | POA: Diagnosis not present

## 2021-02-03 DIAGNOSIS — K8020 Calculus of gallbladder without cholecystitis without obstruction: Secondary | ICD-10-CM | POA: Diagnosis not present

## 2021-02-03 DIAGNOSIS — K2990 Gastroduodenitis, unspecified, without bleeding: Secondary | ICD-10-CM | POA: Diagnosis not present

## 2021-02-03 DIAGNOSIS — R5383 Other fatigue: Secondary | ICD-10-CM | POA: Diagnosis not present

## 2021-02-03 DIAGNOSIS — R932 Abnormal findings on diagnostic imaging of liver and biliary tract: Secondary | ICD-10-CM | POA: Diagnosis not present

## 2021-02-03 DIAGNOSIS — K2980 Duodenitis without bleeding: Secondary | ICD-10-CM | POA: Diagnosis present

## 2021-02-03 DIAGNOSIS — F419 Anxiety disorder, unspecified: Secondary | ICD-10-CM | POA: Diagnosis not present

## 2021-02-03 DIAGNOSIS — J449 Chronic obstructive pulmonary disease, unspecified: Secondary | ICD-10-CM | POA: Diagnosis present

## 2021-02-03 DIAGNOSIS — N281 Cyst of kidney, acquired: Secondary | ICD-10-CM | POA: Diagnosis not present

## 2021-02-03 DIAGNOSIS — I5032 Chronic diastolic (congestive) heart failure: Secondary | ICD-10-CM | POA: Diagnosis present

## 2021-02-03 DIAGNOSIS — J439 Emphysema, unspecified: Secondary | ICD-10-CM | POA: Diagnosis present

## 2021-02-03 DIAGNOSIS — J90 Pleural effusion, not elsewhere classified: Secondary | ICD-10-CM | POA: Diagnosis not present

## 2021-02-03 DIAGNOSIS — R21 Rash and other nonspecific skin eruption: Secondary | ICD-10-CM | POA: Diagnosis present

## 2021-02-03 DIAGNOSIS — Z20822 Contact with and (suspected) exposure to covid-19: Secondary | ICD-10-CM | POA: Diagnosis present

## 2021-02-03 DIAGNOSIS — I2510 Atherosclerotic heart disease of native coronary artery without angina pectoris: Secondary | ICD-10-CM | POA: Diagnosis present

## 2021-02-03 DIAGNOSIS — C3491 Malignant neoplasm of unspecified part of right bronchus or lung: Secondary | ICD-10-CM

## 2021-02-03 DIAGNOSIS — K8070 Calculus of gallbladder and bile duct without cholecystitis without obstruction: Secondary | ICD-10-CM | POA: Diagnosis present

## 2021-02-03 DIAGNOSIS — R748 Abnormal levels of other serum enzymes: Secondary | ICD-10-CM | POA: Diagnosis not present

## 2021-02-03 DIAGNOSIS — K828 Other specified diseases of gallbladder: Secondary | ICD-10-CM | POA: Diagnosis not present

## 2021-02-03 DIAGNOSIS — C3431 Malignant neoplasm of lower lobe, right bronchus or lung: Secondary | ICD-10-CM | POA: Diagnosis not present

## 2021-02-03 DIAGNOSIS — K831 Obstruction of bile duct: Secondary | ICD-10-CM

## 2021-02-03 DIAGNOSIS — R162 Hepatomegaly with splenomegaly, not elsewhere classified: Secondary | ICD-10-CM | POA: Diagnosis present

## 2021-02-03 DIAGNOSIS — Z8249 Family history of ischemic heart disease and other diseases of the circulatory system: Secondary | ICD-10-CM

## 2021-02-03 DIAGNOSIS — Z6834 Body mass index (BMI) 34.0-34.9, adult: Secondary | ICD-10-CM | POA: Diagnosis not present

## 2021-02-03 DIAGNOSIS — I10 Essential (primary) hypertension: Secondary | ICD-10-CM | POA: Diagnosis present

## 2021-02-03 DIAGNOSIS — R233 Spontaneous ecchymoses: Secondary | ICD-10-CM | POA: Diagnosis not present

## 2021-02-03 DIAGNOSIS — C3490 Malignant neoplasm of unspecified part of unspecified bronchus or lung: Secondary | ICD-10-CM | POA: Diagnosis not present

## 2021-02-03 DIAGNOSIS — Z91041 Radiographic dye allergy status: Secondary | ICD-10-CM

## 2021-02-03 DIAGNOSIS — T451X5A Adverse effect of antineoplastic and immunosuppressive drugs, initial encounter: Secondary | ICD-10-CM | POA: Diagnosis present

## 2021-02-03 DIAGNOSIS — Z825 Family history of asthma and other chronic lower respiratory diseases: Secondary | ICD-10-CM

## 2021-02-03 DIAGNOSIS — G8929 Other chronic pain: Secondary | ICD-10-CM | POA: Diagnosis present

## 2021-02-03 DIAGNOSIS — Z86010 Personal history of colonic polyps: Secondary | ICD-10-CM

## 2021-02-03 LAB — CMP (CANCER CENTER ONLY)
ALT: 23 U/L (ref 0–44)
AST: 46 U/L — ABNORMAL HIGH (ref 15–41)
Albumin: 3.4 g/dL — ABNORMAL LOW (ref 3.5–5.0)
Alkaline Phosphatase: 54 U/L (ref 38–126)
Anion gap: 13 (ref 5–15)
BUN: 38 mg/dL — ABNORMAL HIGH (ref 8–23)
CO2: 27 mmol/L (ref 22–32)
Calcium: 8.3 mg/dL — ABNORMAL LOW (ref 8.9–10.3)
Chloride: 97 mmol/L — ABNORMAL LOW (ref 98–111)
Creatinine: 2.21 mg/dL — ABNORMAL HIGH (ref 0.61–1.24)
GFR, Estimated: 32 mL/min — ABNORMAL LOW (ref >60)
Glucose, Bld: 116 mg/dL — ABNORMAL HIGH (ref 70–99)
Potassium: 3 mmol/L — ABNORMAL LOW (ref 3.5–5.1)
Sodium: 137 mmol/L (ref 135–145)
Total Bilirubin: 1.6 mg/dL — ABNORMAL HIGH (ref 0.3–1.2)
Total Protein: 7.5 g/dL (ref 6.5–8.1)

## 2021-02-03 LAB — CBC WITH DIFFERENTIAL (CANCER CENTER ONLY)
Abs Immature Granulocytes: 0 10*3/uL (ref 0.00–0.07)
Basophils Absolute: 0 10*3/uL (ref 0.0–0.1)
Basophils Relative: 0 %
Eosinophils Absolute: 0 10*3/uL (ref 0.0–0.5)
Eosinophils Relative: 2 %
HCT: 22.4 % — ABNORMAL LOW (ref 39.0–52.0)
Hemoglobin: 7.7 g/dL — ABNORMAL LOW (ref 13.0–17.0)
Immature Granulocytes: 0 %
Lymphocytes Relative: 28 %
Lymphs Abs: 0.4 10*3/uL — ABNORMAL LOW (ref 0.7–4.0)
MCH: 30.4 pg (ref 26.0–34.0)
MCHC: 34.4 g/dL (ref 30.0–36.0)
MCV: 88.5 fL (ref 80.0–100.0)
Monocytes Absolute: 0.2 10*3/uL (ref 0.1–1.0)
Monocytes Relative: 18 %
Neutro Abs: 0.7 10*3/uL — ABNORMAL LOW (ref 1.7–7.7)
Neutrophils Relative %: 52 %
Platelet Count: <5 10*3/uL — CL (ref 150–400)
RBC: 2.53 MIL/uL — ABNORMAL LOW (ref 4.22–5.81)
RDW: 16.1 % — ABNORMAL HIGH (ref 11.5–15.5)
WBC Count: 1.3 10*3/uL — ABNORMAL LOW (ref 4.0–10.5)
nRBC: 0 % (ref 0.0–0.2)

## 2021-02-03 LAB — SAMPLE TO BLOOD BANK

## 2021-02-03 LAB — PREPARE RBC (CROSSMATCH)

## 2021-02-03 MED ORDER — ONDANSETRON HCL 4 MG PO TABS: 8.0000 mg | ORAL_TABLET | Freq: Three times a day (TID) | ORAL | Status: DC | PRN

## 2021-02-03 MED ORDER — CLONAZEPAM 1 MG PO TABS
1.0000 mg | ORAL_TABLET | Freq: Every evening | ORAL | Status: DC | PRN
  Administered 2021-02-06 – 2021-02-12 (×5): 1 mg via ORAL
  Filled 2021-02-03 (×5): qty 1

## 2021-02-03 MED ORDER — SODIUM CHLORIDE 0.9 % IV BOLUS
1000.0000 mL | Freq: Once | INTRAVENOUS | Status: AC
  Administered 2021-02-03: 1000 mL via INTRAVENOUS

## 2021-02-03 MED ORDER — MORPHINE SULFATE ER 30 MG PO TBCR
60.0000 mg | EXTENDED_RELEASE_TABLET | Freq: Two times a day (BID) | ORAL | Status: DC
  Administered 2021-02-03 – 2021-02-13 (×20): 60 mg via ORAL
  Filled 2021-02-03 (×6): qty 2
  Filled 2021-02-03: qty 4
  Filled 2021-02-03 (×2): qty 2
  Filled 2021-02-03: qty 4
  Filled 2021-02-03 (×12): qty 2

## 2021-02-03 MED ORDER — PROCHLORPERAZINE MALEATE 10 MG PO TABS
10.0000 mg | ORAL_TABLET | Freq: Four times a day (QID) | ORAL | Status: DC | PRN
  Administered 2021-02-09: 20:00:00 10 mg via ORAL
  Filled 2021-02-03: qty 1

## 2021-02-03 MED ORDER — HYDROMORPHONE HCL 1 MG/ML IJ SOLN
0.5000 mg | Freq: Once | INTRAMUSCULAR | Status: AC
  Administered 2021-02-03: 0.5 mg via INTRAVENOUS
  Filled 2021-02-03: qty 1

## 2021-02-03 MED ORDER — SENNOSIDES-DOCUSATE SODIUM 8.6-50 MG PO TABS
1.0000 | ORAL_TABLET | Freq: Every evening | ORAL | Status: DC | PRN
  Administered 2021-02-07 – 2021-02-12 (×2): 1 via ORAL
  Filled 2021-02-03 (×2): qty 1

## 2021-02-03 MED ORDER — POTASSIUM CHLORIDE CRYS ER 20 MEQ PO TBCR
20.0000 meq | EXTENDED_RELEASE_TABLET | Freq: Every day | ORAL | Status: DC
  Administered 2021-02-04 – 2021-02-05 (×2): 20 meq via ORAL
  Filled 2021-02-03 (×2): qty 1

## 2021-02-03 MED ORDER — ACETAMINOPHEN 325 MG PO TABS
650.0000 mg | ORAL_TABLET | Freq: Four times a day (QID) | ORAL | Status: DC | PRN
  Administered 2021-02-04 – 2021-02-12 (×9): 650 mg via ORAL
  Filled 2021-02-03 (×9): qty 2

## 2021-02-03 MED ORDER — FOLIC ACID 1 MG PO TABS
1.0000 mg | ORAL_TABLET | Freq: Every day | ORAL | Status: DC
  Administered 2021-02-04 – 2021-02-13 (×9): 1 mg via ORAL
  Filled 2021-02-03 (×9): qty 1

## 2021-02-03 MED ORDER — ROSUVASTATIN CALCIUM 10 MG PO TABS
20.0000 mg | ORAL_TABLET | Freq: Every day | ORAL | Status: DC
  Administered 2021-02-03 – 2021-02-13 (×10): 20 mg via ORAL
  Filled 2021-02-03 (×4): qty 2
  Filled 2021-02-03: qty 1
  Filled 2021-02-03 (×2): qty 2
  Filled 2021-02-03: qty 1
  Filled 2021-02-03 (×3): qty 2

## 2021-02-03 MED ORDER — ACETAMINOPHEN 500 MG PO TABS: 500.0000 mg | ORAL_TABLET | Freq: Four times a day (QID) | ORAL | Status: DC | PRN

## 2021-02-03 MED ORDER — LORATADINE 10 MG PO TABS
10.0000 mg | ORAL_TABLET | Freq: Once | ORAL | Status: AC
  Administered 2021-02-03: 10 mg via ORAL
  Filled 2021-02-03: qty 1

## 2021-02-03 MED ORDER — CALCIUM CARBONATE ANTACID 500 MG PO CHEW
2.0000 | CHEWABLE_TABLET | Freq: Every day | ORAL | Status: DC | PRN
Start: 2021-02-03 — End: 2021-02-13

## 2021-02-03 MED ORDER — POTASSIUM CHLORIDE CRYS ER 20 MEQ PO TBCR
40.0000 meq | EXTENDED_RELEASE_TABLET | ORAL | Status: AC
  Administered 2021-02-03 – 2021-02-04 (×2): 40 meq via ORAL
  Filled 2021-02-03 (×2): qty 2

## 2021-02-03 MED ORDER — ALLOPURINOL 100 MG PO TABS
200.0000 mg | ORAL_TABLET | Freq: Every day | ORAL | Status: DC
  Administered 2021-02-04 – 2021-02-13 (×9): 200 mg via ORAL
  Filled 2021-02-03 (×9): qty 2

## 2021-02-03 MED ORDER — TBO-FILGRASTIM 300 MCG/0.5ML ~~LOC~~ SOSY
300.0000 ug | PREFILLED_SYRINGE | Freq: Once | SUBCUTANEOUS | Status: AC
  Administered 2021-02-04: 300 ug via SUBCUTANEOUS
  Filled 2021-02-03: qty 0.5

## 2021-02-03 MED ORDER — SODIUM CHLORIDE 0.9 % IV SOLN
10.0000 mL/h | Freq: Once | INTRAVENOUS | Status: AC
  Administered 2021-02-04: 10 mL/h via INTRAVENOUS

## 2021-02-03 MED ORDER — ACETAMINOPHEN 650 MG RE SUPP: 650.0000 mg | Freq: Four times a day (QID) | RECTAL | Status: DC | PRN

## 2021-02-03 MED ORDER — SORBITOL 70 % SOLN
30.0000 mL | Freq: Every day | Status: DC | PRN
  Filled 2021-02-03: qty 30

## 2021-02-03 MED ORDER — FLUTICASONE PROPIONATE 50 MCG/ACT NA SUSP: 1.0000 | Freq: Every day | NASAL | Status: DC | PRN

## 2021-02-03 MED ORDER — TBO-FILGRASTIM 300 MCG/0.5ML ~~LOC~~ SOSY
300.0000 ug | PREFILLED_SYRINGE | Freq: Once | SUBCUTANEOUS | Status: AC
  Administered 2021-02-03: 300 ug via SUBCUTANEOUS
  Filled 2021-02-03 (×2): qty 0.5

## 2021-02-03 MED ORDER — SODIUM CHLORIDE 0.9 % IV SOLN
10.0000 mL/h | Freq: Once | INTRAVENOUS | Status: AC
  Administered 2021-02-03: 10 mL/h via INTRAVENOUS

## 2021-02-03 MED ORDER — METOPROLOL TARTRATE 25 MG PO TABS
25.0000 mg | ORAL_TABLET | Freq: Two times a day (BID) | ORAL | Status: DC
  Administered 2021-02-03 – 2021-02-12 (×12): 25 mg via ORAL
  Filled 2021-02-03 (×18): qty 1

## 2021-02-03 MED ORDER — NITROGLYCERIN 0.4 MG SL SUBL: 0.4000 mg | SUBLINGUAL_TABLET | SUBLINGUAL | Status: DC | PRN

## 2021-02-03 MED ORDER — HYDROXYZINE HCL 50 MG PO TABS
50.0000 mg | ORAL_TABLET | Freq: Three times a day (TID) | ORAL | Status: DC | PRN
  Filled 2021-02-03 (×2): qty 1

## 2021-02-03 MED ORDER — MORPHINE SULFATE 15 MG PO TABS
30.0000 mg | ORAL_TABLET | Freq: Four times a day (QID) | ORAL | Status: DC | PRN
  Administered 2021-02-04 – 2021-02-12 (×13): 30 mg via ORAL
  Filled 2021-02-03: qty 1
  Filled 2021-02-03 (×6): qty 2
  Filled 2021-02-03: qty 1
  Filled 2021-02-03 (×6): qty 2

## 2021-02-03 NOTE — Assessment & Plan Note (Signed)
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  He is receiving Alimta 500 mg/M2, Carboplatin, and Keytruda. It is thought that the cytopenia is due to the [place]. The plan going forward will be to discontinue this agent. The patient has been sent to the hospital as a direct admit due to report of <5 platelets on labwork today. The patient has diffuse petechiae including coalescence of petechiae on his lower extremities bilaterally. His hemoglobin is 7.7. WBC is 1.3 with an ANC of 0.7. Hemoglobin is 7.7. He received transfusion on 01/27/2021 when his hemoglobin was 6.8. The patient has received one dose of Granix today. Dr. Mora Appl wanted the patient to receive a second dose in the am. He received transfusion of 2 units of PRBC's today. The patient has also received 2 pheresis packs of platelets today. The patient will be admitted on neutropenic precautions.

## 2021-02-03 NOTE — Assessment & Plan Note (Signed)
Stable. Albuterol nebs will be made available on a prn basis.

## 2021-02-03 NOTE — ED Notes (Signed)
Coming from cancer center-platelets less than 5-Hgb 7.7

## 2021-02-03 NOTE — ED Triage Notes (Signed)
Pt from the cancer center for blood work, Platelet < 4, hgb= 7.7. Pt also reports blood in stool. Last chemo on November 16. H/O Stage IV Lung CA.

## 2021-02-03 NOTE — Assessment & Plan Note (Addendum)
NSCLCA: stage IV (T3,N2,M1c) with malignant lymphadenopathy

## 2021-02-03 NOTE — Assessment & Plan Note (Signed)
Will continue on vistaril prn as at home.

## 2021-02-03 NOTE — Assessment & Plan Note (Signed)
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  He is receiving Alimta 500 mg/M2, Carboplatin, and Keytruda. It is thought that the cytopenia is due to the [place]. The plan going forward will be to discontinue this agent. The patient has been sent to the hospital as a direct admit due to report of <5 platelets on labwork today. The patient has diffuse petechiae including coalescence of petechiae on his lower extremities bilaterally. His hemoglobin is 7.7. WBC is 1.3 with an ANC of 0.7. Hemoglobin is 7.7. He received transfusion on 01/27/2021 when his hemoglobin was 6.8. The patient has received one dose of Granix today. Dr. Mora Appl wanted the patient to receive a second dose in the am. He received transfusion of 2 units of PRBC's today. The patient has also received 2 pheresis packs of platelets today. The patient will be admitted on neutropenic precautions.

## 2021-02-03 NOTE — Telephone Encounter (Signed)
CRITICAL VALUE STICKER  CRITICAL VALUE: Platelets <5  RECEIVER (on-site recipient of call): Yetta Glassman, CMA  DATE & TIME NOTIFIED: 02/03/21 at 12:15pm  MESSENGER (representative from lab): Lelan Pons  MD NOTIFIED: Julien Nordmann  TIME OF NOTIFICATION: 02/03/21 at 12:17pm  RESPONSE: Per Dr. Julien Nordmann, pt needs to go to the ER. Pt c/o blood in his stool and dizziness.   In addition to pt going to the ER, pt has been taken off Alimta and it has been removed from his tx plan.
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
## 2021-02-03 NOTE — ED Notes (Signed)
Pt signed blood product consent, verbalized understanding. This RN witnessed.
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
## 2021-02-03 NOTE — Assessment & Plan Note (Signed)
Cancer related pain. The patient will be continued on his home pain regimen. He will also have a bowel regimen ordered.

## 2021-02-04 ENCOUNTER — Encounter (HOSPITAL_COMMUNITY): Payer: Self-pay | Admitting: Internal Medicine

## 2021-02-04 DIAGNOSIS — D61810 Antineoplastic chemotherapy induced pancytopenia: Secondary | ICD-10-CM | POA: Diagnosis not present

## 2021-02-04 DIAGNOSIS — T451X5A Adverse effect of antineoplastic and immunosuppressive drugs, initial encounter: Secondary | ICD-10-CM | POA: Diagnosis not present

## 2021-02-04 LAB — TYPE AND SCREEN
ABO/RH(D): O NEG
Antibody Screen: NEGATIVE
Unit division: 0
Unit division: 0

## 2021-02-04 LAB — BPAM RBC
Blood Product Expiration Date: 202212282359
Blood Product Expiration Date: 202212282359
ISSUE DATE / TIME: 202211301733
ISSUE DATE / TIME: 202211302305
Unit Type and Rh: 9500
Unit Type and Rh: 9500

## 2021-02-04 LAB — COMPREHENSIVE METABOLIC PANEL
ALT: 23 U/L (ref 0–44)
AST: 49 U/L — ABNORMAL HIGH (ref 15–41)
Albumin: 3 g/dL — ABNORMAL LOW (ref 3.5–5.0)
Alkaline Phosphatase: 52 U/L (ref 38–126)
Anion gap: 11 (ref 5–15)
BUN: 29 mg/dL — ABNORMAL HIGH (ref 8–23)
CO2: 27 mmol/L (ref 22–32)
Calcium: 8 mg/dL — ABNORMAL LOW (ref 8.9–10.3)
Chloride: 101 mmol/L (ref 98–111)
Creatinine, Ser: 1.7 mg/dL — ABNORMAL HIGH (ref 0.61–1.24)
GFR, Estimated: 44 mL/min — ABNORMAL LOW (ref >60)
Glucose, Bld: 100 mg/dL — ABNORMAL HIGH (ref 70–99)
Potassium: 3.3 mmol/L — ABNORMAL LOW (ref 3.5–5.1)
Sodium: 139 mmol/L (ref 135–145)
Total Bilirubin: 1.9 mg/dL — ABNORMAL HIGH (ref 0.3–1.2)
Total Protein: 6.7 g/dL (ref 6.5–8.1)

## 2021-02-04 LAB — PROTIME-INR
INR: 1.1 (ref 0.8–1.2)
Prothrombin Time: 13.7 seconds (ref 11.4–15.2)

## 2021-02-04 LAB — CBC WITH DIFFERENTIAL/PLATELET
Abs Immature Granulocytes: 0.04 10*3/uL (ref 0.00–0.07)
Basophils Absolute: 0 10*3/uL (ref 0.0–0.1)
Basophils Relative: 0 %
Eosinophils Absolute: 0.1 10*3/uL (ref 0.0–0.5)
Eosinophils Relative: 1 %
HCT: 26.3 % — ABNORMAL LOW (ref 39.0–52.0)
Hemoglobin: 9.1 g/dL — ABNORMAL LOW (ref 13.0–17.0)
Immature Granulocytes: 1 %
Lymphocytes Relative: 15 %
Lymphs Abs: 0.8 10*3/uL (ref 0.7–4.0)
MCH: 30.6 pg (ref 26.0–34.0)
MCHC: 34.6 g/dL (ref 30.0–36.0)
MCV: 88.6 fL (ref 80.0–100.0)
Monocytes Absolute: 0.3 10*3/uL (ref 0.1–1.0)
Monocytes Relative: 5 %
Neutro Abs: 4.1 10*3/uL (ref 1.7–7.7)
Neutrophils Relative %: 78 %
Platelets: 12 10*3/uL — CL (ref 150–400)
RBC: 2.97 MIL/uL — ABNORMAL LOW (ref 4.22–5.81)
RDW: 16.3 % — ABNORMAL HIGH (ref 11.5–15.5)
WBC: 5.2 10*3/uL (ref 4.0–10.5)
nRBC: 0 % (ref 0.0–0.2)

## 2021-02-04 LAB — BPAM PLATELET PHERESIS
Blood Product Expiration Date: 202212012359
ISSUE DATE / TIME: 202211302304
Unit Type and Rh: 7300

## 2021-02-04 LAB — PREPARE PLATELET PHERESIS: Unit division: 0

## 2021-02-04 LAB — CBC
HCT: 27.6 % — ABNORMAL LOW (ref 39.0–52.0)
Hemoglobin: 9.5 g/dL — ABNORMAL LOW (ref 13.0–17.0)
MCH: 30.6 pg (ref 26.0–34.0)
MCHC: 34.4 g/dL (ref 30.0–36.0)
MCV: 89 fL (ref 80.0–100.0)
Platelets: 14 10*3/uL — CL (ref 150–400)
RBC: 3.1 MIL/uL — ABNORMAL LOW (ref 4.22–5.81)
RDW: 16.6 % — ABNORMAL HIGH (ref 11.5–15.5)
WBC: 4.8 10*3/uL (ref 4.0–10.5)
nRBC: 0 % (ref 0.0–0.2)

## 2021-02-04 LAB — RESP PANEL BY RT-PCR (FLU A&B, COVID) ARPGX2
Influenza A by PCR: NEGATIVE
Influenza B by PCR: NEGATIVE
SARS Coronavirus 2 by RT PCR: NEGATIVE

## 2021-02-04 LAB — HIV ANTIBODY (ROUTINE TESTING W REFLEX): HIV Screen 4th Generation wRfx: NONREACTIVE

## 2021-02-04 LAB — MRSA NEXT GEN BY PCR, NASAL: MRSA by PCR Next Gen: NOT DETECTED

## 2021-02-04 MED ORDER — DIPHENHYDRAMINE HCL 50 MG/ML IJ SOLN
12.5000 mg | Freq: Four times a day (QID) | INTRAMUSCULAR | Status: DC | PRN
  Administered 2021-02-04 – 2021-02-12 (×4): 12.5 mg via INTRAVENOUS
  Filled 2021-02-04 (×5): qty 1

## 2021-02-04 MED ORDER — SODIUM CHLORIDE 0.9 % IV SOLN
2.0000 g | Freq: Every day | INTRAVENOUS | Status: DC
  Administered 2021-02-04: 2 g via INTRAVENOUS
  Filled 2021-02-04 (×2): qty 20

## 2021-02-04 MED ORDER — HYDROMORPHONE HCL 1 MG/ML IJ SOLN
0.5000 mg | INTRAMUSCULAR | Status: DC | PRN
  Administered 2021-02-04: 0.5 mg via INTRAVENOUS
  Filled 2021-02-04: qty 0.5

## 2021-02-04 MED ORDER — NALOXONE HCL 0.4 MG/ML IJ SOLN: 0.4000 mg | INTRAMUSCULAR | Status: DC | PRN

## 2021-02-04 MED ORDER — VANCOMYCIN HCL IN DEXTROSE 1-5 GM/200ML-% IV SOLN: 1000.0000 mg | INTRAVENOUS | Status: DC

## 2021-02-04 MED ORDER — VANCOMYCIN HCL 2000 MG/400ML IV SOLN
2000.0000 mg | Freq: Once | INTRAVENOUS | Status: AC
  Administered 2021-02-04: 2000 mg via INTRAVENOUS
  Filled 2021-02-04: qty 400

## 2021-02-04 MED ORDER — DIPHENHYDRAMINE HCL 50 MG/ML IJ SOLN
12.5000 mg | Freq: Once | INTRAMUSCULAR | Status: AC
  Administered 2021-02-04: 12.5 mg via INTRAVENOUS
  Filled 2021-02-04: qty 1

## 2021-02-04 MED ORDER — LINEZOLID 600 MG/300ML IV SOLN
600.0000 mg | Freq: Two times a day (BID) | INTRAVENOUS | Status: DC
  Filled 2021-02-04: qty 300

## 2021-02-04 MED ORDER — POLYETHYLENE GLYCOL 3350 17 G PO PACK
17.0000 g | PACK | Freq: Every day | ORAL | Status: DC | PRN
  Filled 2021-02-04 (×2): qty 1

## 2021-02-04 MED ORDER — CAMPHOR-MENTHOL 0.5-0.5 % EX LOTN
TOPICAL_LOTION | Freq: Four times a day (QID) | CUTANEOUS | Status: AC
  Filled 2021-02-04: qty 222

## 2021-02-04 MED ORDER — HYDROMORPHONE HCL 1 MG/ML IJ SOLN
0.5000 mg | Freq: Every day | INTRAMUSCULAR | Status: DC | PRN
  Administered 2021-02-09: 0.5 mg via INTRAVENOUS
  Filled 2021-02-04: qty 0.5

## 2021-02-04 MED ORDER — LACTATED RINGERS IV SOLN
INTRAVENOUS | Status: AC
Start: 2021-02-04 — End: 2021-02-06

## 2021-02-04 MED ORDER — CHLORHEXIDINE GLUCONATE CLOTH 2 % EX PADS
6.0000 | MEDICATED_PAD | Freq: Every day | CUTANEOUS | Status: DC
  Administered 2021-02-05 – 2021-02-13 (×9): 6 via TOPICAL

## 2021-02-04 NOTE — Progress Notes (Signed)
Pharmacy Antibiotic Note  [name] [name] is a 65 y.o. male presented to ED on 02/03/2021 with pancytopenia.  Pharmacy has been consulted for Vancomycin dosing for cellulitis  Plan: Ceftriaxone per MD Vancomycin 2g IV x1 then 1g IV q24h. (SCr 1.7, est AUC 491) Measure Vanc peak and trough as needed.  Goal AUC = 400 - 550. Follow up renal function, culture results, and clinical course.   Height: 5\' 8"  (172.7 cm) Weight: 103.9 kg (229 lb) IBW/kg (Calculated) : 68.4  Temp (24hrs), Avg:98.5 F (36.9 C), Min:97.4 F (36.3 C), Max:99 F (37.2 C)  Recent Labs  Lab 02/03/21 1135 02/04/21 0334 02/04/21 0743  WBC 1.3* 4.8 5.2  CREATININE 2.21* 1.70*  --     Estimated Creatinine Clearance: 50.6 mL/min (A) (by C-G formula based on SCr of 1.7 mg/dL (H)).    Allergies  Allergen Reactions   Iohexol Other (See Comments)     Code: HIVES, Desc: PT STATES HE HAD AN IVP 15 YRS AGO AND HAD SOB AND BROKE OUT IN HIVES., Onset Date: [id number]    Ivp Dye [Iodinated Diagnostic Agents] Other (See Comments)    intolerance    Antimicrobials this admission: 12/1 Ceftriaxone >> 12/1 Vancomycin >>   Dose adjustments this admission:   Microbiology results:   Thank you for allowing pharmacy to be a part of this patient's care.  Gretta Arab PharmD, BCPS Clinical Pharmacist [facility] main pharmacy [phone] 02/04/2021 11:11 AM

## 2021-02-04 NOTE — ED Notes (Addendum)
Spoke with blood bank. They will have to order platelets for patient's second unit. Per blood bank, 2 separate orders must be placed for preparation of  platelets. Only one order was placed initially, blood bank was not aware order was for 2 units. Order has been placed now, blood bank to notify when platelets are ready. MD Swayze made aware.

## 2021-02-04 NOTE — Plan of Care (Signed)
New admission 1511, Chemo induced pancytopenia.  Pt received 2U PRBCs and 2U Platelets.  Oncology following.  Pain management per orders.  Recent port placement, dressing to C/D/I.   Problem: Education: Goal: Knowledge of General Education information will improve Description: Including pain rating scale, medication(s)/side effects and non-pharmacologic comfort measures Outcome: Progressing   Problem: Clinical Measurements: Goal: Ability to maintain clinical measurements within normal limits will improve Outcome: Progressing   Problem: Clinical Measurements: Goal: Will remain free from infection Outcome: Progressing   Problem: Activity: Goal: Risk for activity intolerance will decrease Outcome: Progressing   Problem: Coping: Goal: Level of anxiety will decrease Outcome: Progressing   Problem: Pain Managment: Goal: General experience of comfort will improve Outcome: Progressing   Problem: Safety: Goal: Ability to remain free from injury will improve Outcome: Progressing

## 2021-02-04 NOTE — Progress Notes (Signed)
HEMATOLOGY-ONCOLOGY PROGRESS NOTE  SUBJECTIVE: [name] [name] is a 65 year old male with stage IV non-small cell lung cancer-unable to differentiate between squamous cell carcinoma and adenocarcinoma.  He is currently receiving maintenance chemotherapy with Alimta which is dose reduced to 400 mg per metered squared, and Keytruda 200 mg IV every 3 weeks.  He has had issues with pancytopenia and weekly labs are being monitored.  He had labs performed in our office yesterday and platelet count was less than 5000.  He was to go to the emergency department for evaluation because he was also having blood in his stool and dizziness.  The patient has received 2 units PRBCs and a unit of platelets so far.  Labs this morning show a normal WBC and ANC, hemoglobin 9.1, platelet count 12,000.  The patient reports that he has some blood in his stool yesterday.  He has not noticed any since he has been here at the hospital.  Denies epistaxis, hemoptysis, hematemesis, hematuria.  Notes rash to his legs and arms.  Bruises easily.  He is not having any fevers.  Oncology History  Non-small cell carcinoma of right lung, stage 4 ([place])  09/29/2020 Initial Diagnosis   Non-small cell carcinoma of right lung, stage 4 ([place])   10/08/2020 - 10/10/2020 Chemotherapy          10/28/2020 -  Chemotherapy   Patient is on Treatment Plan : LUNG CARBOplatin / Pemetrexed / Pembrolizumab q21d Induction x 4 cycles / Maintenance Pemetrexed + Pembrolizumab        REVIEW OF SYSTEMS:   Constitutional: Denies fevers, chills  Eyes: Denies blurriness of vision Ears, nose, mouth, throat, and face: Denies mucositis or sore throat Respiratory: Denies cough, dyspnea or wheezes Cardiovascular: Denies palpitation, chest discomfort Gastrointestinal:  Denies nausea, heartburn or change in bowel habits Skin: Reports rash to his legs and arms Lymphatics: Denies new lymphadenopathy, he bruises easily Neurological:Denies numbness, tingling or new  weaknesses Behavioral/Psych: Mood is stable, no new changes  Extremities: No lower extremity edema All other systems were reviewed with the patient and are negative.  I have reviewed the past medical history, past surgical history, social history and family history with the patient and they are unchanged from previous note.   PHYSICAL EXAMINATION: ECOG PERFORMANCE STATUS: 2 - Symptomatic, <50% confined to bed  Vitals:   02/04/21 0700 02/04/21 0946  BP: 100/60 133/71  Pulse: 77 67  Resp: 17 (!) 33  Temp:  (!) 97.4 F (36.3 C)  SpO2: 91% 92%   Filed Weights   02/03/21 1255  Weight: 103.9 kg    Intake/Output from previous day: 11/30 0701 - 12/01 0700 In: 2321.9 [I.V.:32.5; Blood:1290.4; IV Piggyback:999] Out: -   GENERAL:alert, no distress and comfortable SKIN: Petechiae noted over arms and legs, ecchymoses present EYES: normal, Conjunctiva are pink and non-injected, sclera clear OROPHARYNX:no exudate, no erythema and lips, buccal mucosa, and tongue normal  LUNGS: clear to auscultation and percussion with normal breathing effort HEART: regular rate & rhythm and no murmurs and no lower extremity edema ABDOMEN:abdomen soft, non-tender and normal bowel sounds NEURO: alert & oriented x 3 with fluent speech, no focal motor/sensory deficits  LABORATORY DATA:  I have reviewed the data as listed CMP Latest Ref Rng & Units 02/04/2021 02/03/2021 01/27/2021  Glucose 70 - 99 mg/dL 100(H) 116(H) 118(H)  BUN 8 - 23 mg/dL 29(H) 38(H) 35(H)  Creatinine 0.61 - 1.24 mg/dL 1.70(H) 2.21(H) 2.71(H)  Sodium 135 - 145 mmol/L 139 137 138  Potassium 3.5 -  5.1 mmol/L 3.3(L) 3.0(L) 3.5  Chloride 98 - 111 mmol/L 101 97(L) 103  CO2 22 - 32 mmol/L 27 27 23   Calcium 8.9 - 10.3 mg/dL 8.0(L) 8.3(L) 8.8(L)  Total Protein 6.5 - 8.1 g/dL 6.7 7.5 7.1  Total Bilirubin 0.3 - 1.2 mg/dL 1.9(H) 1.6(H) 1.5(H)  Alkaline Phos 38 - 126 U/L 52 54 52  AST 15 - 41 U/L 49(H) 46(H) 81(H)  ALT 0 - 44 U/L 23 23 22      Lab Results  Component Value Date   WBC 5.2 02/04/2021   HGB 9.1 (L) 02/04/2021   HCT 26.3 (L) 02/04/2021   MCV 88.6 02/04/2021   PLT 12 (LL) 02/04/2021   NEUTROABS 4.1 02/04/2021    IR IMAGING GUIDED PORT INSERTION  Result Date: 02/02/2021 CLINICAL DATA:  non-small cell lung carcinoma, needs durable venous access for planned treatment regimen EXAM: TUNNELED PORT CATHETER PLACEMENT WITH ULTRASOUND AND FLUOROSCOPIC GUIDANCE FLUOROSCOPY TIME:  seconds ANESTHESIA/SEDATION: Intravenous Fentanyl 159mg and Versed 2101mwere administered as conscious sedation during continuous monitoring of the patient's level of consciousness and physiological / cardiorespiratory status by the radiology RN, with a total moderate sedation time of 15 minutes. TECHNIQUE: The procedure, risks, benefits, and alternatives were explained to the patient. Questions regarding the procedure were encouraged and answered. The patient understands and consents to the procedure. Patency of the right IJ vein was confirmed with ultrasound with image documentation. An appropriate skin site was determined. Skin site was marked. Region was prepped using maximum barrier technique including cap and mask, sterile gown, sterile gloves, large sterile sheet, and Chlorhexidine as cutaneous antisepsis. The region was infiltrated locally with 1% lidocaine. Under real-time ultrasound guidance, the right IJ vein was accessed with a 21 gauge micropuncture needle; the needle tip within the vein was confirmed with ultrasound image documentation. Needle was exchanged over a 018 guidewire for transitional dilator, and vascular measurement was performed. A small incision was made on the right anterior chest wall and a subcutaneous pocket fashioned. The power-injectable port was positioned and its catheter tunneled to the right IJ dermatotomy site. The transitional dilator was exchanged over an Amplatz wire for a peel-away sheath, through which the port  catheter, which had been trimmed to the appropriate length, was advanced and positioned under fluoroscopy with its tip at the cavoatrial junction. Spot chest radiograph confirms good catheter position and no pneumothorax. The port was flushed per protocol. The pocket was closed with deep interrupted and subcuticular continuous 3-0 Monocryl sutures. The incisions were covered with Dermabond then covered with a sterile dressing. The patient tolerated the procedure well. COMPLICATIONS: COMPLICATIONS None immediate IMPRESSION: Technically successful right IJ power-injectable port catheter placement. Ready for routine use. Electronically Signed   By: D Lucrezia Europe.D.   On: 02/02/2021 15:41    ASSESSMENT AND PLAN: This is a very pleasant [age]ear old Caucasian male diagnosed with a stage IV (T3, N2, M1 C) non-small cell lung cancer, likely adenocarcinoma based on the presence of KRAS G12C mutation presented with large right lower lobe lung mass, enlarged with adjacent subpleural nodules in addition to right hilar and subcarinal lymphadenopathy and left adrenal gland metastasis diagnosed in July 2022.  Molecular studies showed positive KRAS G12C mutation, his histology is likely adenocarcinoma   The patient started initially on systemic chemotherapy with carboplatin for AUC of 5, paclitaxel 175 Mg/M2 and Keytruda 200 Mg IV every 3 weeks with Neulasta support based on the suspicious of histology of squamous cell carcinoma.  He  has a rough time with this treatment with significant fatigue and weakness as well as myalgia and arthralgia from the Neulasta injection. Based on the recent finding on the molecular studies was positive for KRAS G12C mutation, his histology is likely adenocarcinoma and he was switched to carboplatin for AUC of 5, Alimta 500 Mg/M2 and Keytruda 200 Mg IV every 3 weeks. Starting from cycle #3, the patient's carboplatin was reduced to AUC of 4 and Alimta was reduced to 400 mg per metered square.   Carboplatin was discontinued after cycle #4.  He is now receiving maintenance Alimta with Keytruda and last dose was given on 01/20/2021.  The Alimta dose was reduced to 400 mg per metered squared.  Despite this, he has had persistent pancytopenia.  Labs from today have been reviewed.  His pancytopenia is due to recent chemotherapy.  We will plan on stopping Alimta.  WBC is normal and there is no need for G-CSF at this time.  Recommend supportive care for low blood counts.  He should receive PRBC transfusion for hemoglobin less than 8 and platelet transfusion for platelet count less than 20,000 or active bleeding.  Patient's wife was inquiring about his restaging CT scans and if they can be done while he is in the hospital.  He is due for restaging CT of the chest/abdomen/pelvis without contrast prior to his next visit with [place].  Currently, his CT scan is scheduled for 12/5.  I will go ahead and order for CT scans to be performed while he is in the hospital with plans for formal review and decision making regarding treatment at his next visit as previously scheduled on 12/7.  Future Appointments  Date Time Provider [place]  02/05/2021  8:00 AM [facility] LAB [facility] None  02/08/2021 11:00 AM [facility] 2 [facility]CT [place]  02/10/2021  8:30 AM [facility] [place] FLUSH [facility] None  02/10/2021  9:00 AM [name], MD [facility] None  02/10/2021 10:00 AM [facility] INFUSION [facility] None  02/17/2021 12:00 PM [facility] LAB [facility] None  02/24/2021 12:00 PM [facility] LAB [facility] None  03/03/2021 10:30 AM [facility] [place] FLUSH [facility] None  03/03/2021 11:00 AM Heilingoetter, Cassandra L, PA-C [facility] None  03/03/2021 11:45 AM [facility] INFUSION [facility] None  03/10/2021  9:30 AM [facility] [place] FLUSH [facility] None  03/17/2021  9:30 AM [facility] [place] FLUSH [facility] None  03/24/2021  8:00 AM [facility] [place] FLUSH [facility] None  03/24/2021  8:45 AM [name], MD  [facility] None  03/24/2021  9:30 AM [facility] INFUSION [facility] None      LOS: 1 day   Mikey Bussing, DNP, AGPCNP-BC, AOCNP 02/04/21

## 2021-02-04 NOTE — Progress Notes (Signed)
PROGRESS NOTE  [name] [name] CWC:[id number] DOB: 04/03/1955 DOA: 02/03/2021 PCP: [name], MD  HPI/Recap of past 24 hours:  The patient is a 65 yr old man who is receiving palliative chemotherapy for stage IV (T3, N2, M1c) NSCLCA with malignant lymphadenopathy in his chest. He is receiving Alimta 500 mg/M2, Carboplatin, and Keytruda. It is believed that the cytopenia is due to the alimta despite attempts to reduce the dose. It will be discontinued according to notes. The patient has noted petechia diffusely over his body, but concentrated on his lower extremities bilaterally. The patient's WBC is 1.3. ANC is 0.7. Platelets today are less than 5, and hemoglobin is 6.6. The patient was transfused with PRBC's last week due to a hemoglobin of 6.8. Today he has receive two pheresis packs of platelets and 2 units of PRBC's. He has been 300 mcg of Granix and will receive another 300 mcg at the recommendation of Dr. Mora Appl in the am.   The patient denies fevers, chills, shortness of breath, chest pain, cough, wheeze, production of sputum. He also denies nausea, vomiting, diarrhea, or constipation. No rashes, sores, or lesions. No neurological changes. He has had a diffuse petechial rash over his body.  02/04/2021: Patient was seen and examined at his bedside.  His sister was present in the room.  He reports left foot redness and pain x4 days.  Examined left foot which appears, erythematous, warm to touch, tender with minimal palpation.  Patient started on empiric IV antibiotics for left foot cellulitis.  Also reports watery stools, C. difficile PCR and GI panel ordered.  Assessment/Plan: Principal Problem:   Antineoplastic chemotherapy induced pancytopenia (HCC) Active Problems:   Essential hypertension   Chronic pain   Anxiety   Right lower lobe lung mass   Thrombocytopenia (HCC)   COPD (chronic obstructive pulmonary disease) (HCC)   Pancytopenia (HCC)   Pancytopenia due to  antineoplastic chemotherapy (HCC)  Thrombocytopenia (HCC) post 1 unit platelet transfusion. 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  He is receiving Alimta 500 mg/M2, Carboplatin, and Keytruda. It is thought that the cytopenia is due to the [place]. The plan going forward will be to discontinue this agent. The patient has been sent to the hospital as a direct admit due to report of <5 platelets on labwork today. The patient has diffuse petechiae including coalescence of petechiae on his lower extremities bilaterally. His hemoglobin is 7.7. WBC is 1.3 with an ANC of 0.7. Hemoglobin is 7.7. He received transfusion on 01/27/2021 when his hemoglobin was 6.8. The patient has received one dose of Granix today. Dr. Mora Appl wanted the patient to receive a second dose in the am. He received transfusion of 2 units of PRBC's today. The patient has also received 2 pheresis packs of platelets today. The patient will be admitted on neutropenic precautions.  Repeat platelet count posttransfusion 14,000> 12,000. Medical oncology consulted, appreciate assistance.   Right lower lobe lung mass NSCLCA: stage IV (T3,N2,M1c) with malignant lymphadenopathy   Anxiety Will continue on vistaril prn as at home. Continue home regimen.   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  He is receiving Alimta 500 mg/M2, Carboplatin,  and Keytruda. It is thought that the cytopenia is due to the [place]. The plan going forward will be to discontinue this agent. The patient has been sent to the  hospital as a direct admit due to report of <5 platelets on labwork today. The patient has diffuse petechiae including coalescence of petechiae on his lower extremities bilaterally. His hemoglobin is 7.7. WBC is 1.3 with an ANC of 0.7. Hemoglobin is 7.7. He received transfusion on 01/27/2021 when his hemoglobin was 6.8. The patient has received one dose of Granix today. Dr. Mora Appl wanted the patient to receive a second dose in the am. He received transfusion of 2 units of PRBC's today. The patient has also received 2 pheresis packs of platelets today. The patient will be admitted on neutropenic precautions.   Left foot cellulitis, POA Obtain MRSA screening Blood cultures in process. Started on IV vancomycin and Rocephin. Follow cultures  AKI improved Presented with creatinine of 2.21 Repeated creatinine 1.7 Avoid nephrotoxic agents, dehydration and hypotension. Started IV fluid hydration LR 75 cc/h x 2 days, continue.  Elevated liver chemistries AST and T bilirubin uptrending Monitor and repeat CMP in the morning   COPD (chronic obstructive pulmonary disease) (HCC) Stable. Albuterol nebs will be made available on a prn basis.   Chronic pain Cancer related pain. The patient will be continued on his home pain regimen. He will also have a bowel regimen ordered.    CODE STATUS: Full Code DVT Prophylaxis: None due to severe thrombocytopenia Family Communication: Sister at bedside. From: Home Disposition: Home      Code Status: Full code  Family Communication: Sister at bedside.  Disposition Plan: Will likely discharge to home   Consultants: Medical oncology  Procedures: None  Antimicrobials: Rocephin IV vancomycin  DVT prophylaxis: SCDs  Status is: Inpatient  Inpatient status.  Patient requires at least 2 midnights for further evaluation and treatment of present condition.      Objective: Vitals:   02/04/21 1239 02/04/21 1347 02/04/21 1355 02/04/21 1602   BP: (!) 116/54 (!) 100/59 (!) 100/56 104/69  Pulse: 77 72 72 79  Resp: 19 18 18 16   Temp: 98.1 F (36.7 C) 98.2 F (36.8 C) 98.2 F (36.8 C) 98.3 F (36.8 C)  TempSrc: Oral Oral Oral Oral  SpO2: 100% 95% 95% 96%  Weight:      Height:        Intake/Output Summary (Last 24 hours) at 02/04/2021 1659 Last data filed at 02/04/2021 1356 Gross per 24 hour  Intake 1551.92 ml  Output --  Net 1551.92 ml   Filed Weights   02/03/21 1255  Weight: 103.9 kg    Exam:  General: 65 y.o. year-old male well developed well nourished in no acute distress.  Alert and oriented x3. Cardiovascular: Regular rate and rhythm with no rubs or gallops.  No thyromegaly or JVD noted.   Respiratory: Clear to auscultation with no wheezes or rales. Good inspiratory effort. Abdomen: Soft nontender nondistended with normal bowel sounds x4 quadrants. Musculoskeletal: No lower extremity edema. 2/4 pulses in all 4 extremities. Skin: Left foot dorsal portion, erythematous, warm, tender with minimal palpation. Psychiatry: Mood is appropriate for condition and setting   Data Reviewed: CBC: Recent Labs  Lab 02/03/21 1135 02/04/21 0334 02/04/21 0743  WBC 1.3* 4.8 5.2  NEUTROABS 0.7*  --  4.1  HGB 7.7* 9.5* 9.1*  HCT 22.4* 27.6* 26.3*  MCV 88.5 89.0 88.6  PLT <5* 14* 12*   Basic Metabolic Panel: Recent Labs  Lab 02/03/21 1135 02/04/21 0334  NA 137 139  K 3.0* 3.3*  CL 97* 101  CO2 27 27  GLUCOSE 116* 100*  BUN 38* 29*  CREATININE 2.21* 1.70*  CALCIUM 8.3* 8.0*   GFR: Estimated Creatinine Clearance: 50.6 mL/min (A) (by C-G formula based on SCr of 1.7 mg/dL (H)). Liver Function Tests: Recent Labs  Lab 02/03/21 1135 02/04/21 0334  AST 46* 49*  ALT 23 23  ALKPHOS 54 52  BILITOT 1.6* 1.9*  PROT 7.5 6.7  ALBUMIN 3.4* 3.0*   No results for input(s): LIPASE, AMYLASE in the last 168 hours. No results for input(s): AMMONIA in the last 168 hours. Coagulation Profile: Recent Labs  Lab  02/04/21 0334  INR 1.1   Cardiac Enzymes: No results for input(s): CKTOTAL, CKMB, CKMBINDEX, TROPONINI in the last 168 hours. BNP (last 3 results) No results for input(s): PROBNP in the last 8760 hours. HbA1C: No results for input(s): HGBA1C in the last 72 hours. CBG: No results for input(s): GLUCAP in the last 168 hours. Lipid Profile: No results for input(s): CHOL, HDL, LDLCALC, TRIG, CHOLHDL, LDLDIRECT in the last 72 hours. Thyroid Function Tests: No results for input(s): TSH, T4TOTAL, FREET4, T3FREE, THYROIDAB in the last 72 hours. Anemia Panel: No results for input(s): VITAMINB12, FOLATE, FERRITIN, TIBC, IRON, RETICCTPCT in the last 72 hours. Urine analysis:    Component Value Date/Time   COLORURINE YELLOW 11/18/2020 0930   APPEARANCEUR CLOUDY (A) 11/18/2020 0930   LABSPEC 1.030 11/18/2020 0930   PHURINE 6.0 11/18/2020 0930   GLUCOSEU NEGATIVE 11/18/2020 0930   HGBUR NEGATIVE 11/18/2020 0930   BILIRUBINUR NEGATIVE 11/18/2020 0930   KETONESUR NEGATIVE 11/18/2020 0930   PROTEINUR NEGATIVE 11/18/2020 0930   NITRITE NEGATIVE 11/18/2020 0930   LEUKOCYTESUR NEGATIVE 11/18/2020 0930   Sepsis Labs: @LABRCNTIP (procalcitonin:4,lacticidven:4)  ) Recent Results (from the past 240 hour(s))  Resp Panel by RT-PCR (Flu A&B, Covid) Nasopharyngeal Swab     Status: None   Collection Time: 02/03/21 11:21 PM   Specimen: Nasopharyngeal Swab; Nasopharyngeal(NP) swabs in vial transport medium  Result Value Ref Range Status   SARS Coronavirus 2 by RT PCR NEGATIVE NEGATIVE Final    Comment: (NOTE) SARS-CoV-2 target nucleic acids are NOT DETECTED.  The SARS-CoV-2 RNA is generally detectable in upper respiratory specimens during the acute phase of infection. The lowest concentration of SARS-CoV-2 viral copies this assay can detect is 138 copies/mL. A negative result does not preclude SARS-Cov-2 infection and should not be used as the sole basis for treatment or other patient management  decisions. A negative result may occur with  improper specimen collection/handling, submission of specimen other than nasopharyngeal swab, presence of viral mutation(s) within the areas targeted by this assay, and inadequate number of viral copies(<138 copies/mL). A negative result must be combined with clinical observations, patient history, and epidemiological information. The expected result is Negative.  Fact Sheet for Patients:  EntrepreneurPulse.com.au  Fact Sheet for Healthcare Providers:  IncredibleEmployment.be  This test is no t yet approved or cleared by the [place] FDA and  has been authorized for detection and/or diagnosis of SARS-CoV-2 by FDA under an Emergency Use Authorization (EUA). This EUA will remain  in effect (meaning this test can be used) for the duration of the COVID-19 declaration under Section 564(b)(1) of the Act, 21 U.S.C.section 360bbb-3(b)(1), unless the authorization is terminated  or revoked sooner.       Influenza A by PCR NEGATIVE NEGATIVE Final   Influenza B by PCR NEGATIVE NEGATIVE  Final    Comment: (NOTE) The Xpert Xpress SARS-CoV-2/FLU/RSV plus assay is intended as an aid in the diagnosis of influenza from Nasopharyngeal swab specimens and should not be used as a sole basis for treatment. Nasal washings and aspirates are unacceptable for Xpert Xpress SARS-CoV-2/FLU/RSV testing.  Fact Sheet for Patients: EntrepreneurPulse.com.au  Fact Sheet for Healthcare Providers: IncredibleEmployment.be  This test is not yet approved or cleared by the [place] FDA and has been authorized for detection and/or diagnosis of SARS-CoV-2 by FDA under an Emergency Use Authorization (EUA). This EUA will remain in effect (meaning this test can be used) for the duration of the COVID-19 declaration under Section 564(b)(1) of the Act, 21 U.S.C. section 360bbb-3(b)(1), unless the  authorization is terminated or revoked.  Performed at [facility], [place] [address], [place], [place] [address]       Studies: No results found.  Scheduled Meds:  allopurinol  200 mg Oral Daily   camphor-menthol   Topical QID   folic acid  1 mg Oral Daily   metoprolol tartrate  25 mg Oral BID   morphine  60 mg Oral Q12H   potassium chloride SA  20 mEq Oral Daily   rosuvastatin  20 mg Oral Daily   Tbo-filgastrim (GRANIX) SQ  300 mcg Subcutaneous ONCE-1800    Continuous Infusions:  cefTRIAXone (ROCEPHIN)  IV Stopped (02/04/21 1145)   lactated ringers 75 mL/hr at 02/04/21 1353   [START ON 02/05/2021] vancomycin       LOS: 1 day     Kayleen Memos, MD [facility] Pager [phone]  If 7PM-7AM, please contact night-coverage [email or web address] Password [facility] 02/04/2021, 4:59 PM

## 2021-02-05 ENCOUNTER — Inpatient Hospital Stay: Payer: PPO

## 2021-02-05 ENCOUNTER — Inpatient Hospital Stay (HOSPITAL_COMMUNITY): Payer: PPO

## 2021-02-05 DIAGNOSIS — D61810 Antineoplastic chemotherapy induced pancytopenia: Secondary | ICD-10-CM | POA: Diagnosis not present

## 2021-02-05 DIAGNOSIS — D61818 Other pancytopenia: Secondary | ICD-10-CM | POA: Diagnosis not present

## 2021-02-05 DIAGNOSIS — T451X5A Adverse effect of antineoplastic and immunosuppressive drugs, initial encounter: Secondary | ICD-10-CM | POA: Diagnosis not present

## 2021-02-05 LAB — PREPARE PLATELET PHERESIS: Unit division: 0

## 2021-02-05 LAB — COMPREHENSIVE METABOLIC PANEL
ALT: 21 U/L (ref 0–44)
AST: 42 U/L — ABNORMAL HIGH (ref 15–41)
Albumin: 2.9 g/dL — ABNORMAL LOW (ref 3.5–5.0)
Alkaline Phosphatase: 58 U/L (ref 38–126)
Anion gap: 11 (ref 5–15)
BUN: 22 mg/dL (ref 8–23)
CO2: 25 mmol/L (ref 22–32)
Calcium: 7.9 mg/dL — ABNORMAL LOW (ref 8.9–10.3)
Chloride: 101 mmol/L (ref 98–111)
Creatinine, Ser: 1.49 mg/dL — ABNORMAL HIGH (ref 0.61–1.24)
GFR, Estimated: 52 mL/min — ABNORMAL LOW (ref >60)
Glucose, Bld: 102 mg/dL — ABNORMAL HIGH (ref 70–99)
Potassium: 2.6 mmol/L — CL (ref 3.5–5.1)
Sodium: 137 mmol/L (ref 135–145)
Total Bilirubin: 0.8 mg/dL (ref 0.3–1.2)
Total Protein: 6.2 g/dL — ABNORMAL LOW (ref 6.5–8.1)

## 2021-02-05 LAB — CBC WITH DIFFERENTIAL/PLATELET
Abs Immature Granulocytes: 0.18 10*3/uL — ABNORMAL HIGH (ref 0.00–0.07)
Basophils Absolute: 0 10*3/uL (ref 0.0–0.1)
Basophils Relative: 0 %
Eosinophils Absolute: 0.2 10*3/uL (ref 0.0–0.5)
Eosinophils Relative: 1 %
HCT: 26.6 % — ABNORMAL LOW (ref 39.0–52.0)
Hemoglobin: 9.2 g/dL — ABNORMAL LOW (ref 13.0–17.0)
Immature Granulocytes: 2 %
Lymphocytes Relative: 6 %
Lymphs Abs: 0.7 10*3/uL (ref 0.7–4.0)
MCH: 30.9 pg (ref 26.0–34.0)
MCHC: 34.6 g/dL (ref 30.0–36.0)
MCV: 89.3 fL (ref 80.0–100.0)
Monocytes Absolute: 0.7 10*3/uL (ref 0.1–1.0)
Monocytes Relative: 6 %
Neutro Abs: 10.4 10*3/uL — ABNORMAL HIGH (ref 1.7–7.7)
Neutrophils Relative %: 85 %
Platelets: 31 10*3/uL — ABNORMAL LOW (ref 150–400)
RBC: 2.98 MIL/uL — ABNORMAL LOW (ref 4.22–5.81)
RDW: 16.1 % — ABNORMAL HIGH (ref 11.5–15.5)
WBC: 12.2 10*3/uL — ABNORMAL HIGH (ref 4.0–10.5)
nRBC: 0 % (ref 0.0–0.2)

## 2021-02-05 LAB — BPAM PLATELET PHERESIS
Blood Product Expiration Date: 202212042359
ISSUE DATE / TIME: 202212011202
Unit Type and Rh: 5100

## 2021-02-05 LAB — C DIFFICILE QUICK SCREEN W PCR REFLEX
C Diff antigen: NEGATIVE
C Diff interpretation: NOT DETECTED
C Diff toxin: NEGATIVE

## 2021-02-05 LAB — PHOSPHORUS: Phosphorus: 2.5 mg/dL (ref 2.5–4.6)

## 2021-02-05 LAB — MAGNESIUM: Magnesium: 1.1 mg/dL — ABNORMAL LOW (ref 1.7–2.4)

## 2021-02-05 IMAGING — CT CT ABD-PELV W/O CM
2 of 4 series · 14 of 36 positions shown, 17 images · non-contrast
Comparison: CT abdomen and pelvis [DATE]

CLINICAL DATA: Lung cancer restaging

EXAM:
CT ABDOMEN AND PELVIS WITHOUT CONTRAST
TECHNIQUE: Multidetector CT imaging of the abdomen and pelvis was performed
following the standard protocol without IV contrast.

[Series 2: cap w/o · axial · non-contrast · 0.87mm/px · z∈[-675,-105]mm · 11 of 138 slices shown, 14 images]
[im 12/138  mediastinal]
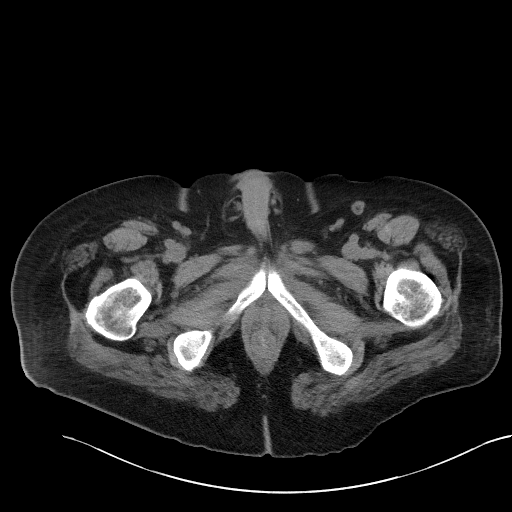
[im 12/138  lung]
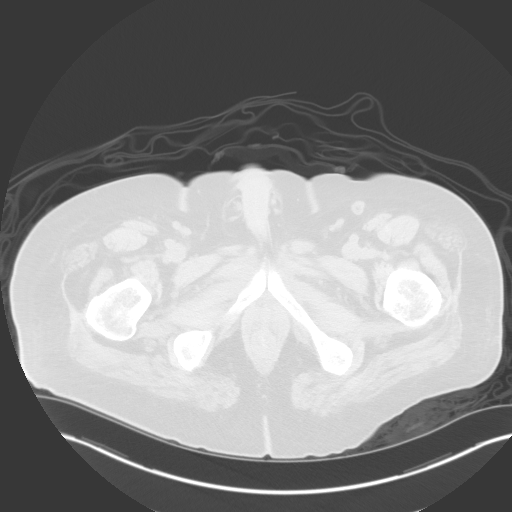
[im 23/138  lung]
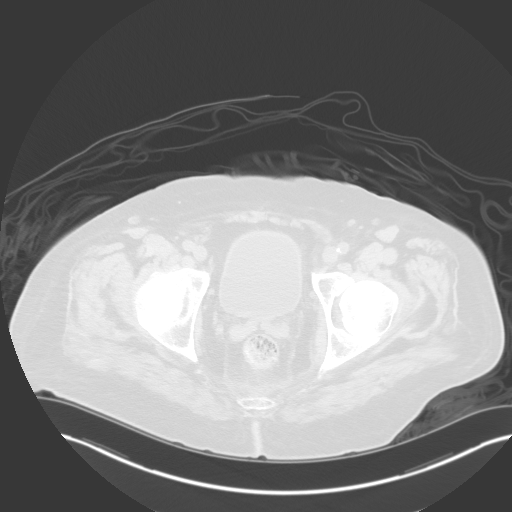
[im 35/138  lung]
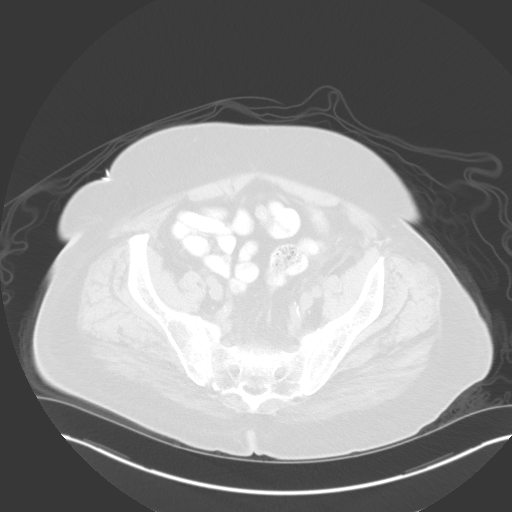
[im 46/138  lung]
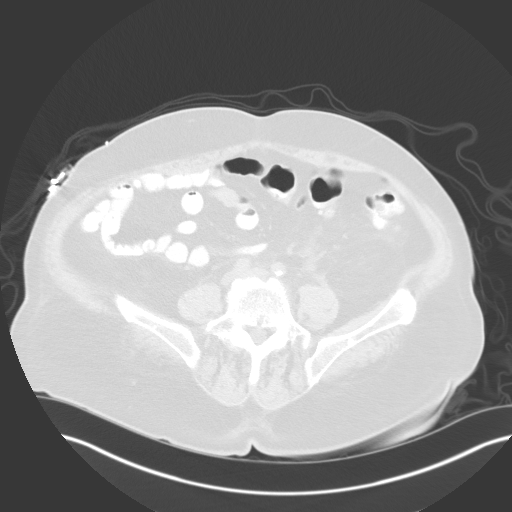
[im 58/138  mediastinal]
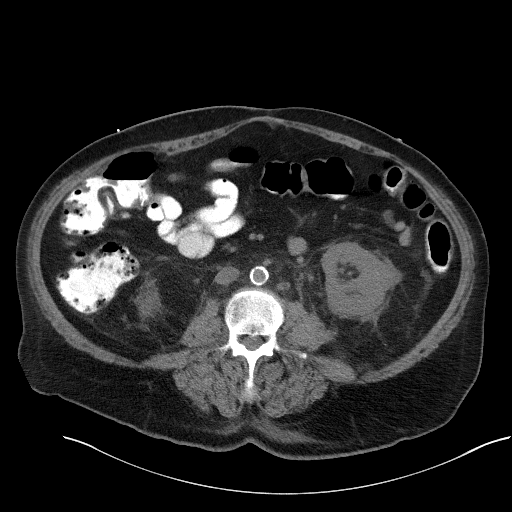
[im 58/138  lung]
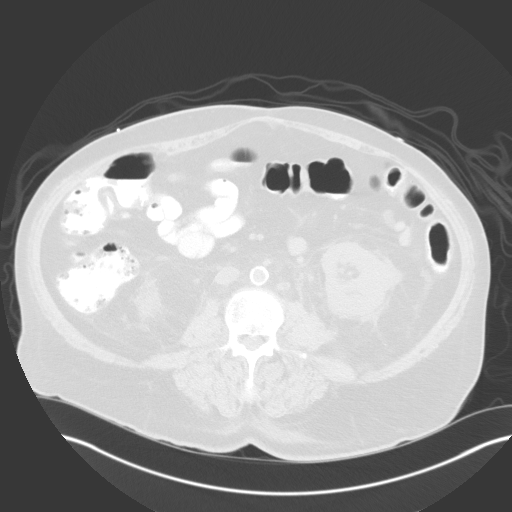
[im 69/138  lung]
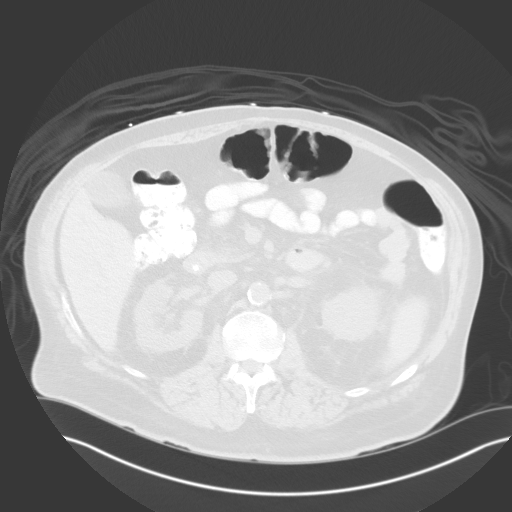
[im 80/138  lung]
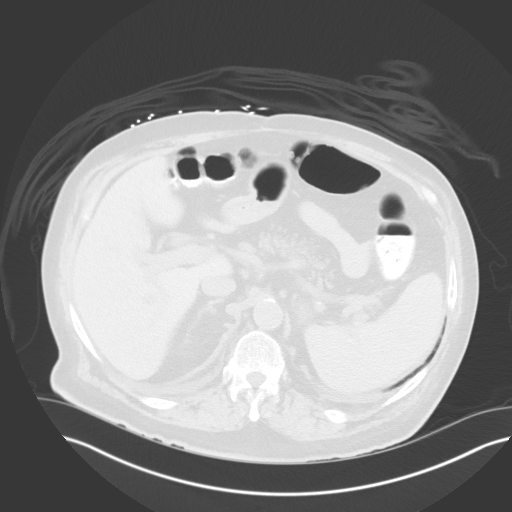
[im 92/138  lung]
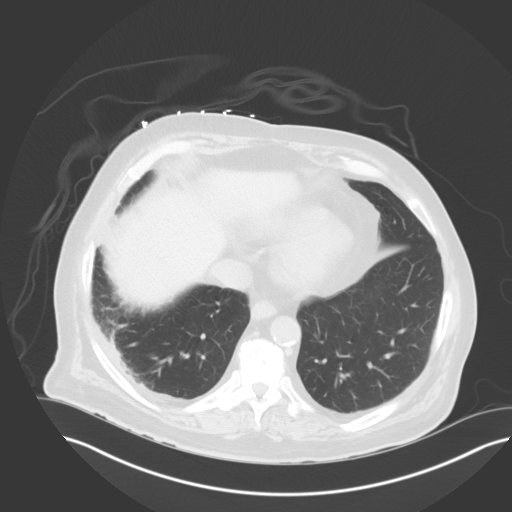
[im 103/138  mediastinal]
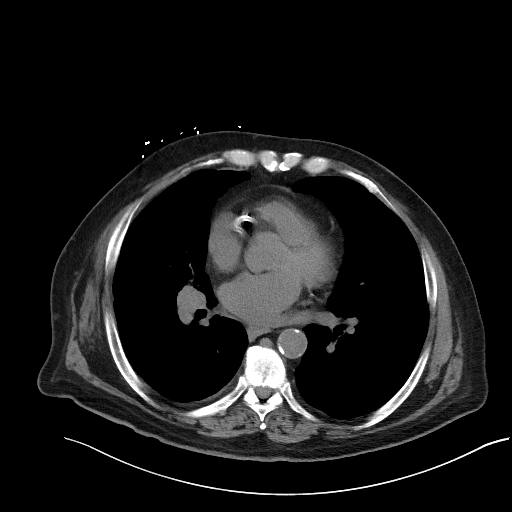
[im 103/138  lung]
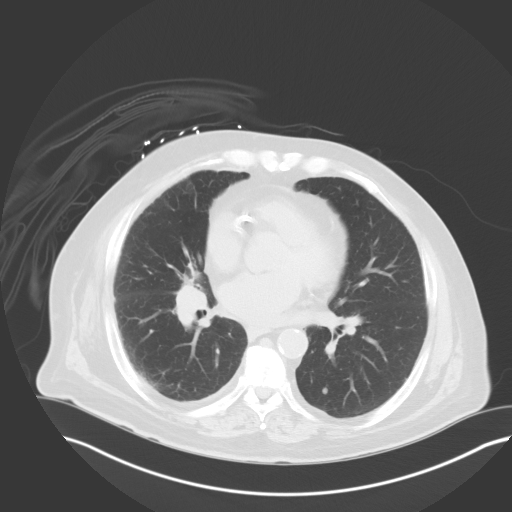
[im 115/138  lung]
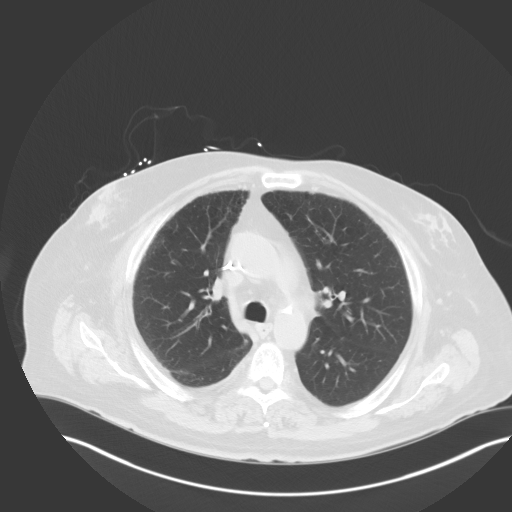
[im 126/138  lung]
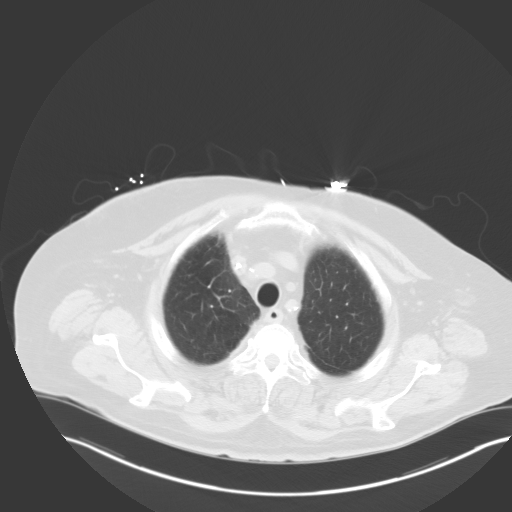

[Series 5: coronals · coronal · 0.97mm/px · 3 of 143 slices shown]
[im 29/143  lung]
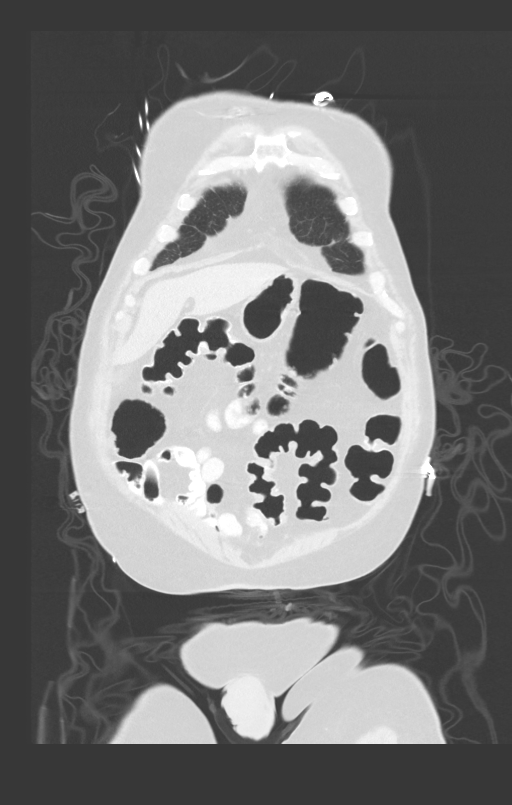
[im 57/143  lung]
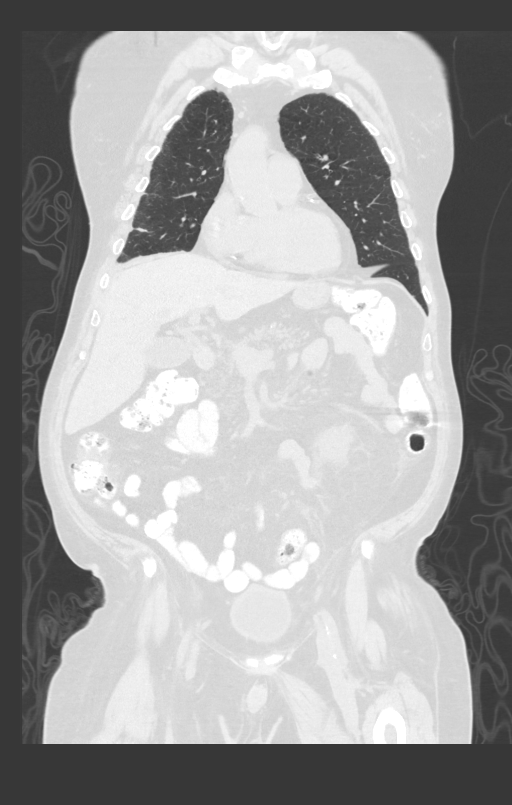
[im 86/143  lung]
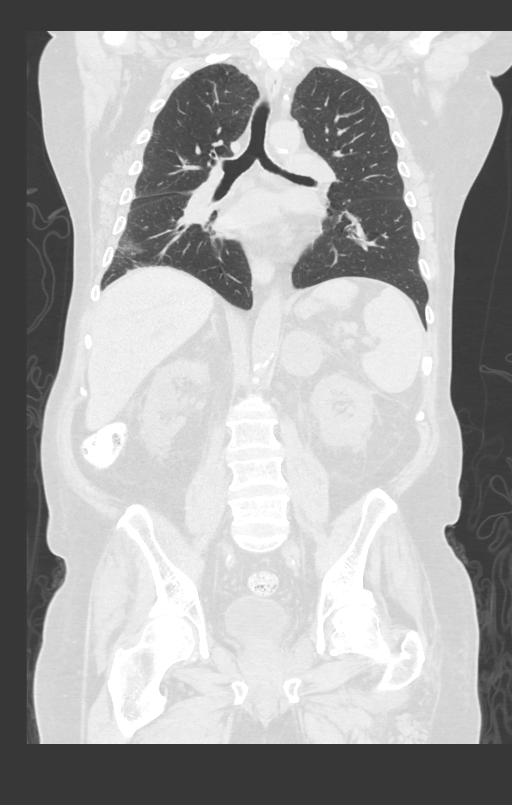

[14 of 36 positions shown; findings below may reference images not displayed]

FINDINGS: Hepatobiliary: Liver is enlarged measuring 18.8 cm in length. No
suspicious hepatic mass visualized. Several stones identified in the
dependent gallbladder. No gallbladder wall thickening or
pericholecystic edema appreciated. Multiple calcific densities
identified in the common bile duct consistent with
choledocholithiasis, measuring up to 1 cm in size. The common bile
duct measures approximately 12 mm in diameter. Similar to previous
study.

Pancreas: Atrophic with no suspicious mass or ductal dilatation
identified.

Spleen: Enlarged measuring 13.8 cm in length.

Adrenals/Urinary Tract: Right adrenal gland is normal. Interval
decreased size of the left adrenal gland mass now measuring 3.8 x
4.1 cm. No nephrolithiasis or hydronephrosis identified bilaterally.
A few hypodense renal cortical cysts identified measuring up to
cm on the right and 3.1 cm on the left. Tiny calcific densities
again seen in the region of the left renal pelvis which are
nonspecific. Bilateral perinephric fat stranding which is mildly
increased since previous study and nonspecific. Mild urinary bladder
wall thickening.

Stomach/Bowel: No bowel obstruction, free air or pneumatosis.
Colonic diverticulosis. No bowel wall edema.

Vascular/Lymphatic: Aortic atherosclerosis. No enlarged abdominal or
pelvic lymph nodes.

Reproductive: Prostate is unremarkable.

Other: No ascites.

Musculoskeletal: No suspicious bony lesions identified.
IMPRESSION: 1. No new mass or lymphadenopathy identified in the abdomen or
pelvis.
2. Interval decreased size of the left adrenal gland mass now
measuring up to 4.1 cm.
3. Hepatosplenomegaly.
4. Cholelithiasis and choledocholithiasis. Correlate clinically for
need for ERCP.
5. Other ancillary findings as described.

## 2021-02-05 IMAGING — CT CT CHEST W/O CM
2 of 4 series · 14 of 36 positions shown, 17 images · non-contrast
Comparison: CT chest [DATE]

CLINICAL DATA: Lung cancer, evaluate treatment response

EXAM:
CT CHEST WITHOUT CONTRAST
TECHNIQUE: Multidetector CT imaging of the chest was performed following the
standard protocol without IV contrast.

[Series 2: cap w/o · axial · non-contrast · 0.87mm/px · z∈[-675,-105]mm · 11 of 138 slices shown, 14 images]
[im 12/138  mediastinal]
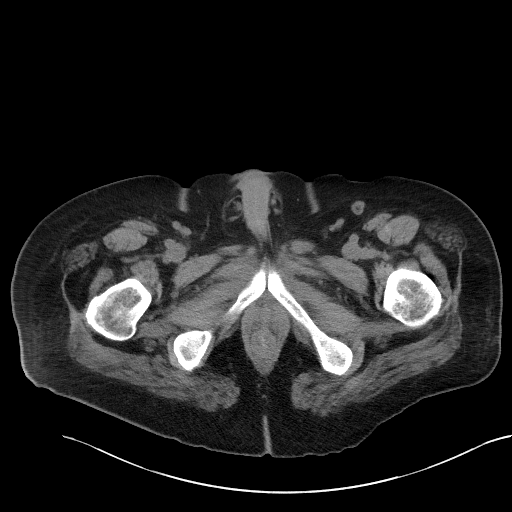
[im 12/138  lung]
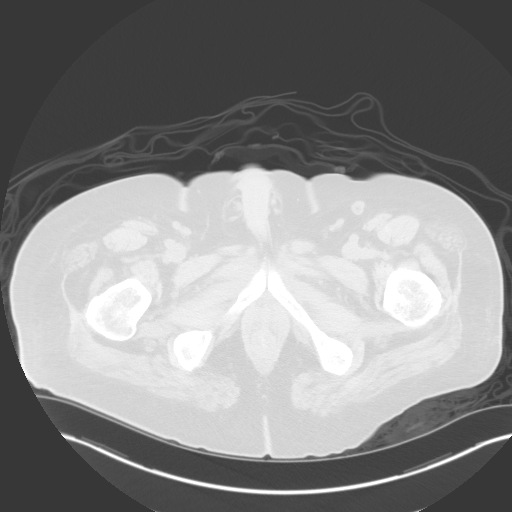
[im 23/138  lung]
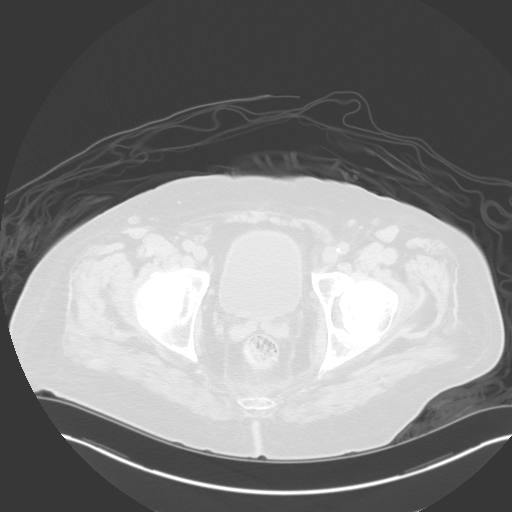
[im 35/138  lung]
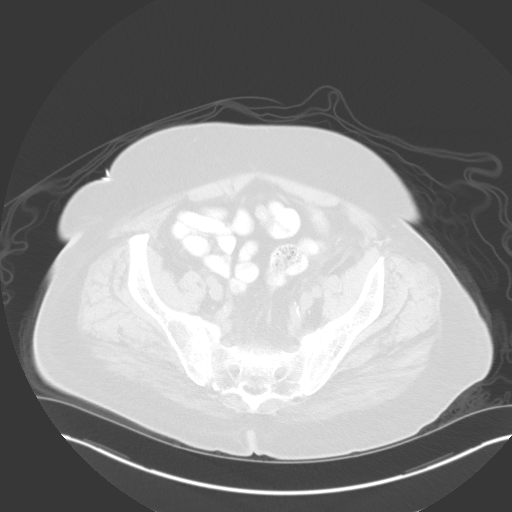
[im 46/138  lung]
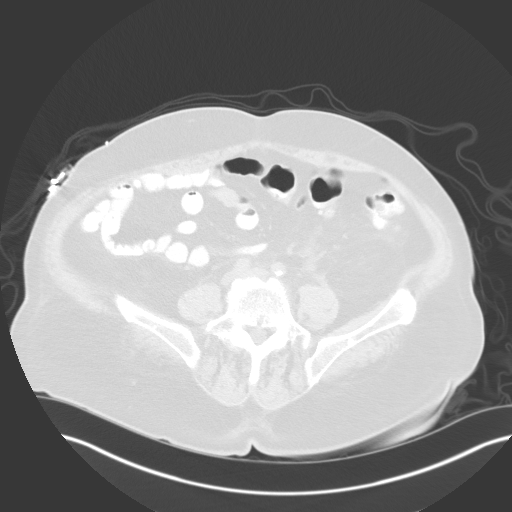
[im 58/138  mediastinal]
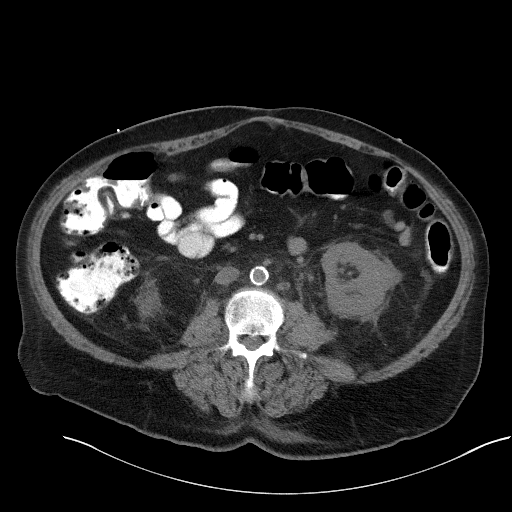
[im 58/138  lung]
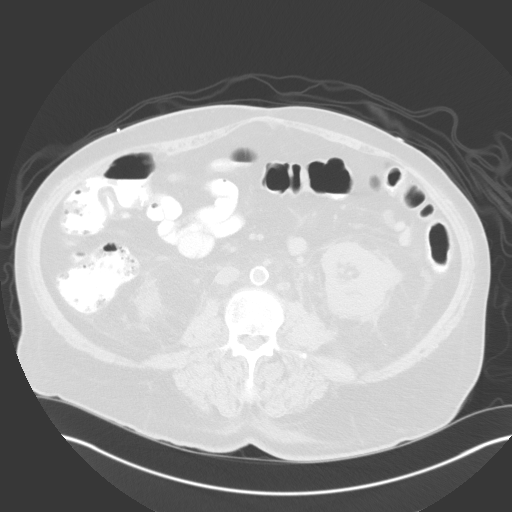
[im 69/138  lung]
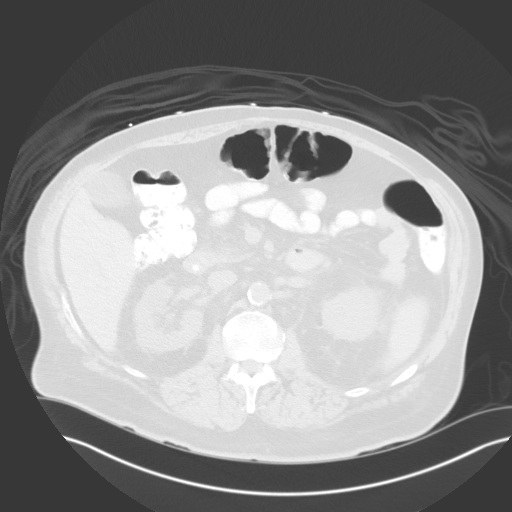
[im 80/138  lung]
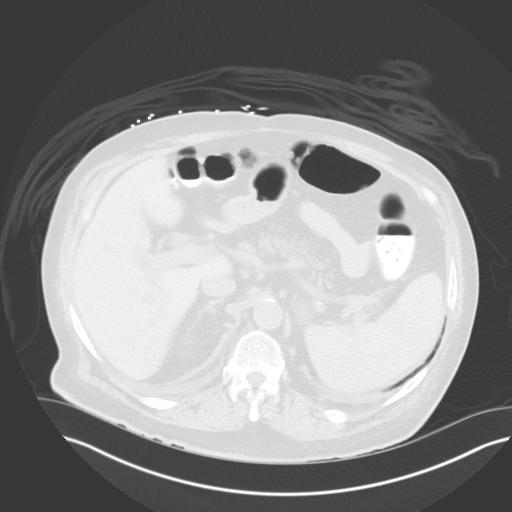
[im 92/138  lung]
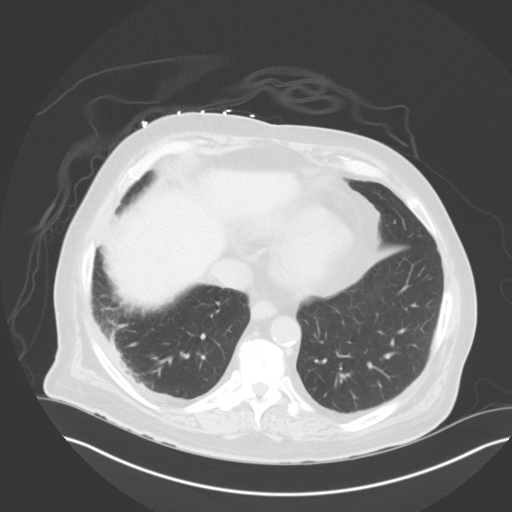
[im 103/138  mediastinal]
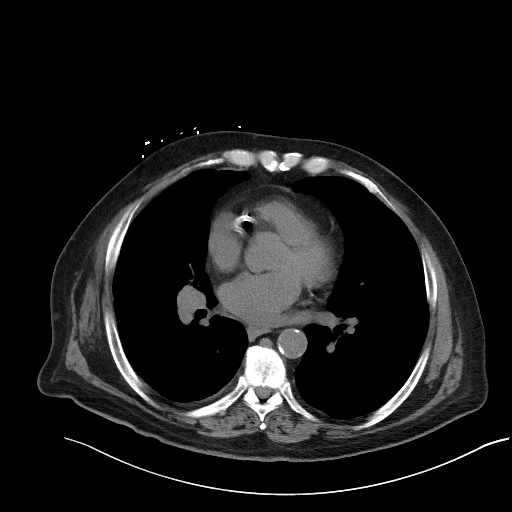
[im 103/138  lung]
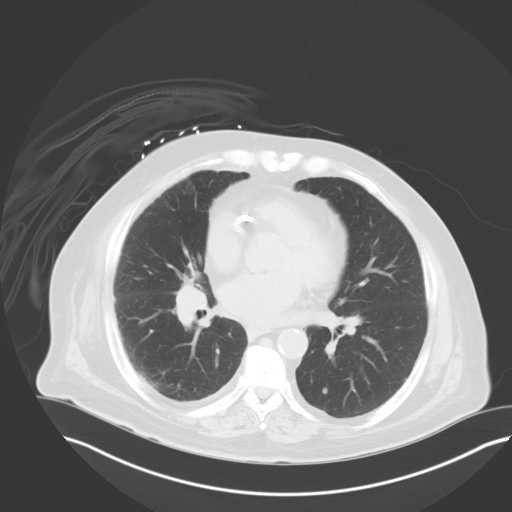
[im 115/138  lung]
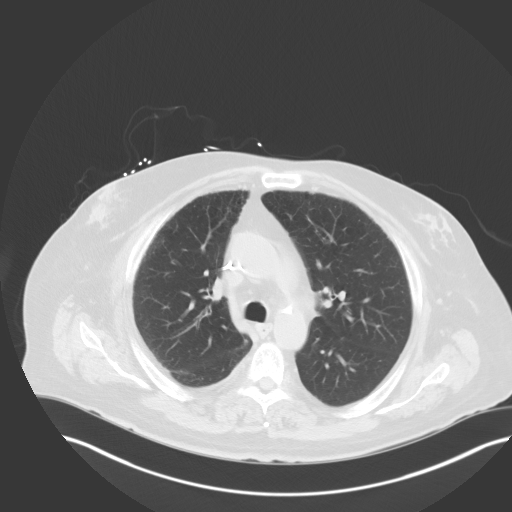
[im 126/138  lung]
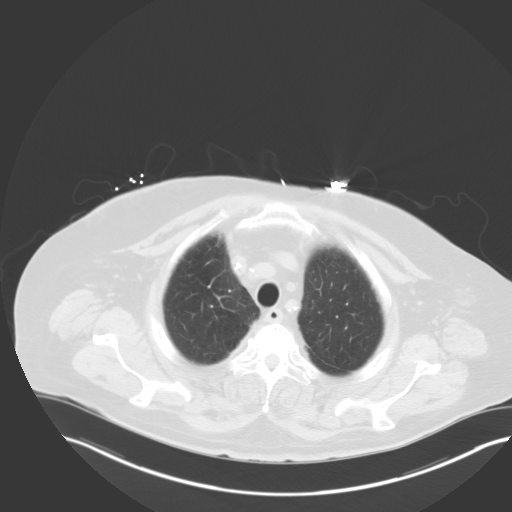

[Series 5: coronals · coronal · 0.97mm/px · 3 of 143 slices shown]
[im 29/143  lung]
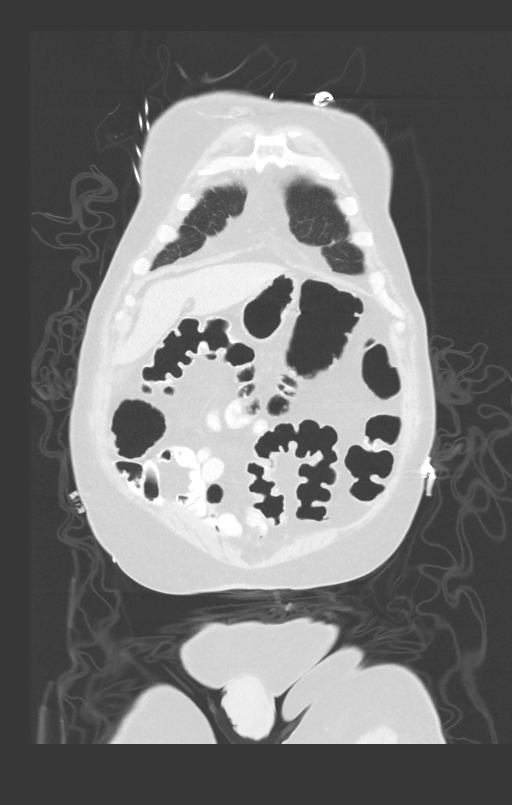
[im 57/143  lung]
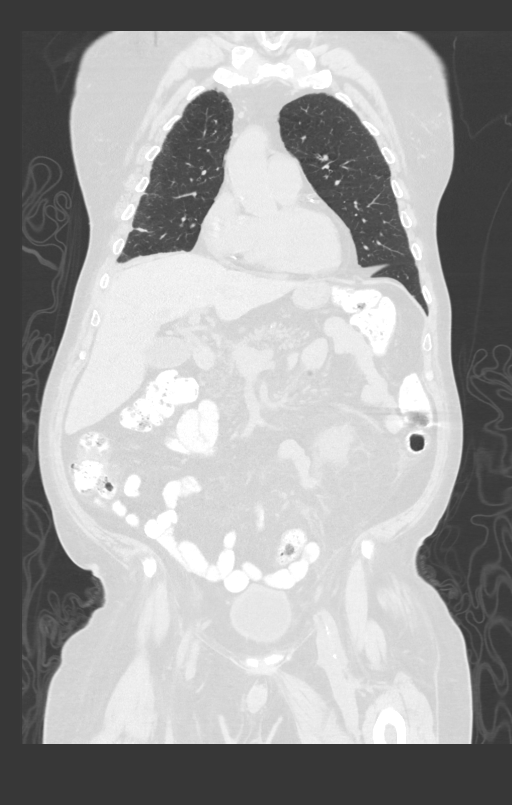
[im 86/143  lung]
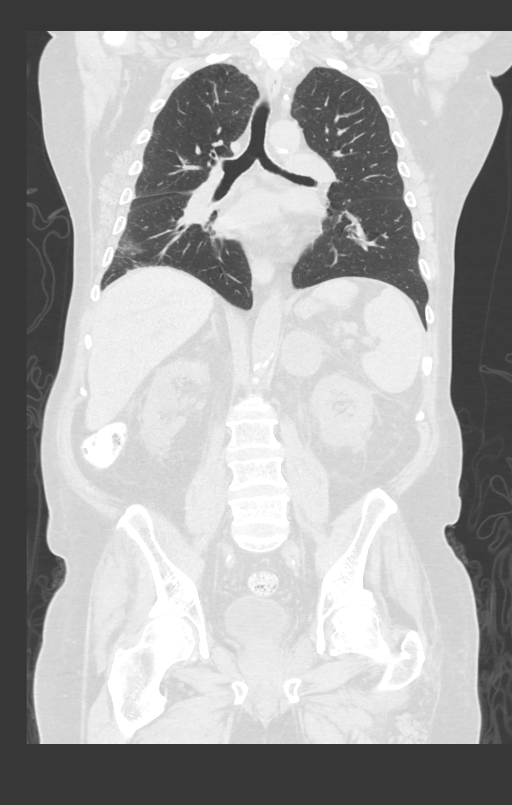

[14 of 36 positions shown; findings below may reference images not displayed]

FINDINGS: Cardiovascular: Heart size is normal. No pericardial effusion
identified. Coronary artery calcifications noted. Main pulmonary
artery is upper normal caliber. Thoracic aorta is normal caliber
with moderate calcified plaques. Right-sided central venous port.

Mediastinum/Nodes: No axillary or mediastinal lymphadenopathy
identified. Stable appearing enlarged lymph node in the right hilum
measuring 1.9 cm.

Lungs/Pleura: Hyperinflated lungs with mild emphysematous changes.
Interval decreased size of an amorphous cavitary mass at the right
lung base near the major fissure now measuring approximally 2.7 x
2.1 cm in axial dimensions. Redemonstration of several small
pulmonary nodules which are stable in size and measure up to 6 mm in
the right lower lobe near the major fissure, 9 mm pleural-based in
the right middle lobe, and 5 mm in the left lower lobe. No new or
significantly enlarged nodules identified. Trace bilateral pleural
effusions right greater than left. No pneumothorax.

Musculoskeletal: No suspicious bony lesions identified.
IMPRESSION: 1. Evidence of treatment response. Decreased size of the dominant
cavitary mass lesion at the right lung base since previous study.
Stable additional smaller pulmonary nodules.
2. Stable appearing enlarged right hilar lymph node.
3. Other chronic findings as described.

Aortic Atherosclerosis ([LJ]-[LJ]) and Emphysema ([LJ]-[LJ]).

## 2021-02-05 MED ORDER — SODIUM CHLORIDE 0.9% FLUSH: 10.0000 mL | INTRAVENOUS | Status: DC | PRN

## 2021-02-05 MED ORDER — POTASSIUM CHLORIDE CRYS ER 20 MEQ PO TBCR
40.0000 meq | EXTENDED_RELEASE_TABLET | Freq: Three times a day (TID) | ORAL | Status: AC
  Administered 2021-02-05 (×3): 40 meq via ORAL
  Filled 2021-02-05 (×3): qty 2

## 2021-02-05 MED ORDER — ENSURE ENLIVE PO LIQD
237.0000 mL | Freq: Two times a day (BID) | ORAL | Status: DC
  Administered 2021-02-06 – 2021-02-13 (×9): 237 mL via ORAL

## 2021-02-05 MED ORDER — CEPHALEXIN 500 MG PO CAPS
500.0000 mg | ORAL_CAPSULE | Freq: Four times a day (QID) | ORAL | Status: DC
Start: 2021-02-05 — End: 2021-02-09
  Administered 2021-02-05 – 2021-02-09 (×17): 500 mg via ORAL
  Filled 2021-02-05 (×17): qty 1

## 2021-02-05 MED ORDER — ADULT MULTIVITAMIN W/MINERALS CH
1.0000 | ORAL_TABLET | Freq: Every day | ORAL | Status: DC
  Administered 2021-02-05 – 2021-02-13 (×8): 1 via ORAL
  Filled 2021-02-05 (×8): qty 1

## 2021-02-05 MED ORDER — CLONAZEPAM 0.125 MG PO TBDP
0.2500 mg | ORAL_TABLET | Freq: Once | ORAL | Status: AC
  Administered 2021-02-05: 0.25 mg via ORAL
  Filled 2021-02-05: qty 2

## 2021-02-05 MED ORDER — CLONAZEPAM 0.5 MG PO TABS: 0.2500 mg | ORAL_TABLET | Freq: Once | ORAL | Status: DC

## 2021-02-05 MED ORDER — MAGNESIUM SULFATE 4 GM/100ML IV SOLN
4.0000 g | Freq: Once | INTRAVENOUS | Status: AC
  Administered 2021-02-05: 4 g via INTRAVENOUS
  Filled 2021-02-05: qty 100

## 2021-02-05 NOTE — Progress Notes (Signed)
HEMATOLOGY-ONCOLOGY PROGRESS NOTE  SUBJECTIVE: [name] denies bleeding this morning.  Afebrile. Drinking oral contrast for restaging CT scan.  Oncology History  Non-small cell carcinoma of right lung, stage 4 ([place])  09/29/2020 Initial Diagnosis   Non-small cell carcinoma of right lung, stage 4 ([place])   10/08/2020 - 10/10/2020 Chemotherapy          10/28/2020 -  Chemotherapy   Patient is on Treatment Plan : LUNG CARBOplatin / Pemetrexed / Pembrolizumab q21d Induction x 4 cycles / Maintenance Pemetrexed + Pembrolizumab        REVIEW OF SYSTEMS:   Constitutional: Denies fevers, chills  Eyes: Denies blurriness of vision Ears, nose, mouth, throat, and face: Denies mucositis or sore throat Respiratory: Denies cough, dyspnea or wheezes Cardiovascular: Denies palpitation, chest discomfort Gastrointestinal:  Denies nausea, heartburn or change in bowel habits Skin: Reports rash to his legs and arms Lymphatics: Denies new lymphadenopathy, he bruises easily Neurological:Denies numbness, tingling or new weaknesses Behavioral/Psych: Mood is stable, no new changes  Extremities: No lower extremity edema All other systems were reviewed with the patient and are negative.  I have reviewed the past medical history, past surgical history, social history and family history with the patient and they are unchanged from previous note.   PHYSICAL EXAMINATION: ECOG PERFORMANCE STATUS: 2 - Symptomatic, <50% confined to bed  Vitals:   02/04/21 2059 02/05/21 0622  BP: (!) 110/59 (!) 107/50  Pulse: 87 85  Resp: 18 20  Temp: 98 F (36.7 C) (!) 97.5 F (36.4 C)  SpO2: 96% 95%   Filed Weights   02/03/21 1255  Weight: 103.9 kg    Intake/Output from previous day: 12/01 0701 - 12/02 0700 In: 787.3 [I.V.:60.2; Blood:229; IV Piggyback:498.1] Out: -   GENERAL:alert, no distress and comfortable SKIN: Petechiae noted over arms and legs, ecchymoses present EYES: normal, Conjunctiva are pink and  non-injected, sclera clear OROPHARYNX:no exudate, no erythema and lips, buccal mucosa, and tongue normal  LUNGS: clear to auscultation and percussion with normal breathing effort HEART: regular rate & rhythm and no murmurs and no lower extremity edema ABDOMEN:abdomen soft, non-tender and normal bowel sounds NEURO: alert & oriented x 3 with fluent speech, no focal motor/sensory deficits  LABORATORY DATA:  I have reviewed the data as listed CMP Latest Ref Rng & Units 02/05/2021 02/04/2021 02/03/2021  Glucose 70 - 99 mg/dL 102(H) 100(H) 116(H)  BUN 8 - 23 mg/dL 22 29(H) 38(H)  Creatinine 0.61 - 1.24 mg/dL 1.49(H) 1.70(H) 2.21(H)  Sodium 135 - 145 mmol/L 137 139 137  Potassium 3.5 - 5.1 mmol/L 2.6(LL) 3.3(L) 3.0(L)  Chloride 98 - 111 mmol/L 101 101 97(L)  CO2 22 - 32 mmol/L 25 27 27   Calcium 8.9 - 10.3 mg/dL 7.9(L) 8.0(L) 8.3(L)  Total Protein 6.5 - 8.1 g/dL 6.2(L) 6.7 7.5  Total Bilirubin 0.3 - 1.2 mg/dL 0.8 1.9(H) 1.6(H)  Alkaline Phos 38 - 126 U/L 58 52 54  AST 15 - 41 U/L 42(H) 49(H) 46(H)  ALT 0 - 44 U/L 21 23 23     Lab Results  Component Value Date   WBC 12.2 (H) 02/05/2021   HGB 9.2 (L) 02/05/2021   HCT 26.6 (L) 02/05/2021   MCV 89.3 02/05/2021   PLT 31 (L) 02/05/2021   NEUTROABS 10.4 (H) 02/05/2021    IR IMAGING GUIDED PORT INSERTION  Result Date: 02/02/2021 CLINICAL DATA:  non-small cell lung carcinoma, needs durable venous access for planned treatment regimen EXAM: TUNNELED PORT CATHETER PLACEMENT WITH ULTRASOUND AND FLUOROSCOPIC  GUIDANCE FLUOROSCOPY TIME:  seconds ANESTHESIA/SEDATION: Intravenous Fentanyl 111mg and Versed 231mwere administered as conscious sedation during continuous monitoring of the patient's level of consciousness and physiological / cardiorespiratory status by the radiology RN, with a total moderate sedation time of 15 minutes. TECHNIQUE: The procedure, risks, benefits, and alternatives were explained to the patient. Questions regarding the procedure  were encouraged and answered. The patient understands and consents to the procedure. Patency of the right IJ vein was confirmed with ultrasound with image documentation. An appropriate skin site was determined. Skin site was marked. Region was prepped using maximum barrier technique including cap and mask, sterile gown, sterile gloves, large sterile sheet, and Chlorhexidine as cutaneous antisepsis. The region was infiltrated locally with 1% lidocaine. Under real-time ultrasound guidance, the right IJ vein was accessed with a 21 gauge micropuncture needle; the needle tip within the vein was confirmed with ultrasound image documentation. Needle was exchanged over a 018 guidewire for transitional dilator, and vascular measurement was performed. A small incision was made on the right anterior chest wall and a subcutaneous pocket fashioned. The power-injectable port was positioned and its catheter tunneled to the right IJ dermatotomy site. The transitional dilator was exchanged over an Amplatz wire for a peel-away sheath, through which the port catheter, which had been trimmed to the appropriate length, was advanced and positioned under fluoroscopy with its tip at the cavoatrial junction. Spot chest radiograph confirms good catheter position and no pneumothorax. The port was flushed per protocol. The pocket was closed with deep interrupted and subcuticular continuous 3-0 Monocryl sutures. The incisions were covered with Dermabond then covered with a sterile dressing. The patient tolerated the procedure well. COMPLICATIONS: COMPLICATIONS None immediate IMPRESSION: Technically successful right IJ power-injectable port catheter placement. Ready for routine use. Electronically Signed   By: D Lucrezia Europe.D.   On: 02/02/2021 15:41    ASSESSMENT AND PLAN: This is a very pleasant [age]ear old Caucasian male diagnosed with a stage IV (T3, N2, M1 C) non-small cell lung cancer, likely adenocarcinoma based on the presence of KRAS  G12C mutation presented with large right lower lobe lung mass, enlarged with adjacent subpleural nodules in addition to right hilar and subcarinal lymphadenopathy and left adrenal gland metastasis diagnosed in July 2022.  Molecular studies showed positive KRAS G12C mutation, his histology is likely adenocarcinoma   The patient started initially on systemic chemotherapy with carboplatin for AUC of 5, paclitaxel 175 Mg/M2 and Keytruda 200 Mg IV every 3 weeks with Neulasta support based on the suspicious of histology of squamous cell carcinoma.  He has a rough time with this treatment with significant fatigue and weakness as well as myalgia and arthralgia from the Neulasta injection. Based on the recent finding on the molecular studies was positive for KRAS G12C mutation, his histology is likely adenocarcinoma and he was switched to carboplatin for AUC of 5, Alimta 500 Mg/M2 and Keytruda 200 Mg IV every 3 weeks. Starting from cycle #3, the patient's carboplatin was reduced to AUC of 4 and Alimta was reduced to 400 mg per metered square.  Carboplatin was discontinued after cycle #4.  He is now receiving maintenance Alimta with Keytruda and last dose was given on 01/20/2021.  The Alimta dose was reduced to 400 mg per metered squared.  Despite this, he has had persistent pancytopenia.  CBC from this morning has been reviewed.  His WBC is elevated at 12.2.  He received 1 dose of Granix and elevation is likely related to this  medication.  I have [place] the neutropenic precautions.  His hemoglobin is stable is 9.2 no need for PRBC transfusion today.  He does have improved to 31,000.  No need for platelet transfusion. He should receive PRBC transfusion for hemoglobin less than 8 and platelet transfusion for platelet count less than 20,000 or active bleeding.  Per family request, will obtain his restaging CT scans prior to hospital discharge as they were due on Monday 12/5.  I have sent a message to my office to cancel  his previously scheduled CT scan for Monday.  He will keep his outpatient follow-up that is scheduled for 12/7 to review the results with Dr. Julien Nordmann.  Future Appointments  Date Time Provider [place]  02/08/2021 11:00 AM [facility] 2 [facility] [place]  02/10/2021  8:30 AM [facility] [place] FLUSH [facility] None  02/10/2021  9:00 AM [name], MD [facility] None  02/10/2021 10:00 AM [facility] INFUSION [facility] None  02/17/2021 12:00 PM [facility] LAB [facility] None  02/24/2021 12:00 PM [facility] LAB [facility] None  03/03/2021 10:30 AM [facility] [place] FLUSH [facility] None  03/03/2021 11:00 AM Heilingoetter, Cassandra L, PA-C [facility] None  03/03/2021 11:45 AM [facility] INFUSION [facility] None  03/10/2021  9:30 AM [facility] [place] FLUSH [facility] None  03/17/2021  9:30 AM [facility] [place] FLUSH [facility] None  03/24/2021  8:00 AM [facility] [place] FLUSH [facility] None  03/24/2021  8:45 AM [name], MD [facility] None  03/24/2021  9:30 AM [facility] INFUSION [facility] None      LOS: 2 days   Mikey Bussing, DNP, AGPCNP-BC, AOCNP 02/05/21

## 2021-02-05 NOTE — Progress Notes (Signed)
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 He is receiving Alimta 500 mg/M2, Carboplatin, and Keytruda. It is believed that the cytopenia is due to the alimta despite attempts to reduce the dose. It will be discontinued according to notes. The patient has noted petechia diffusely over his body, but concentrated on his lower extremities bilaterally. The patient's WBC is 1.3. ANC is 0.7. Platelets today are less than 5, and hemoglobin is 6.6. The patient was transfused with PRBC's last week due to a hemoglobin of 6.8. Today he has receive two pheresis packs of platelets and 2 units of PRBC's. He has been 300 mcg of Granix and will receive another 300 mcg at the recommendation of Dr. Mora Appl in the am.   The patient denies fevers, chills, shortness of breath, chest pain, cough, wheeze, production of sputum. He also denies nausea, vomiting, diarrhea, or constipation. No rashes, sores, or lesions. No neurological changes. He has had a diffuse petechial rash over his body.  02/04/2021: Patient was seen and examined at his bedside.  His sister was present in the room.  He reports left foot redness and pain x4 days.  Examined left foot which appears, erythematous, warm to touch, tender with minimal palpation.  Patient started on empiric IV antibiotics for left foot cellulitis.  Also reports watery stools, C. difficile PCR and GI panel ordered.  02/05/21: Patient was seen and examined at his bedside.  Left foot cellulitis, erythema is improving, still tender but not as severe as yesterday.  Assessment/Plan: Principal Problem:   Antineoplastic chemotherapy induced pancytopenia (HCC) Active Problems:   Essential hypertension   Chronic pain   Anxiety   Right lower  lobe lung mass   Thrombocytopenia (HCC)   COPD (chronic obstructive pulmonary disease) (HCC)   Pancytopenia (HCC)   Pancytopenia due to antineoplastic chemotherapy (HCC)  Thrombocytopenia (HCC) post 1 unit platelet transfusion. 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  He is receiving Alimta 500 mg/M2, Carboplatin, and Keytruda. It is thought that the cytopenia is due to the [place]. The plan going forward will be to discontinue this agent. The patient has been sent to the hospital as a direct admit due to report of <5 platelets on labwork today. The patient has diffuse petechiae including coalescence of petechiae on his lower extremities bilaterally. His hemoglobin is 7.7. WBC is 1.3 with an ANC of 0.7. Hemoglobin is 7.7. He received transfusion on 01/27/2021 when his hemoglobin was 6.8. The patient has received one dose of Granix today. Dr. Mora Appl wanted the patient to receive a second dose in the am. He received transfusion of 2 units of PRBC's today. The patient has also received 2 pheresis packs of platelets today. The patient will be admitted on neutropenic precautions.  Repeat platelet count posttransfusion 14,000> 12,000> 31,000 post 2 units platelet. Medical oncology consulted, appreciate assistance.  Electrolyte disturbances: Hypokalemia, hypomagnesemia Potassium 2.6 Magnesium 1.1. Potassium repleted orally Magnesium repleted intravenously Repeat levels in the morning    Right lower lobe lung mass NSCLCA: stage IV (T3,N2,M1c) with malignant lymphadenopathy   Anxiety Will continue on vistaril prn as at home.  Continue home regimen.   Antineoplastic chemotherapy induced pancytopenia (HCC) The patient has severe pancytopenia due to adverse reaction of a chemotherapeutic agent used to  provide palliative chemotherapy fo rhis stage IV (T3,N2,M1c) NSCLCA with left adrenal metastasis and now malignant thoracic lymphadenopathy.  He is receiving Alimta 500 mg/M2, Carboplatin, and Keytruda. It is thought that the cytopenia is due to the [place]. The plan going forward will be to discontinue this agent. The patient has been sent to the hospital as a direct admit due to report of <5 platelets on labwork today. The patient has diffuse petechiae including coalescence of petechiae on his lower extremities bilaterally. His hemoglobin is 7.7. WBC is 1.3 with an ANC of 0.7. Hemoglobin is 7.7. He received transfusion on 01/27/2021 when his hemoglobin was 6.8. The patient has received one dose of Granix today. Dr. Mora Appl wanted the patient to receive a second dose in the am. He received transfusion of 2 units of PRBC's today. The patient has also received 2 pheresis packs of platelets today. The patient will be admitted on neutropenic precautions.   Left foot cellulitis, POA Obtain MRSA screening Blood cultures in process. Started on IV vancomycin and Rocephin. Continue IV antibiotics, narrow down as able. Follow cultures  Improving AKI  Presented with creatinine of 2.21 Repeated creatinine 1.49 from 1.7 Avoid nephrotoxic agents, dehydration and hypotension. Continue IV fluid hydration LR 75 cc/h x 2 days, continue.  Improving elevated liver chemistries AST and T bilirubin uptrending Monitor and repeat CMP in the morning   COPD (chronic obstructive pulmonary disease) (HCC) Stable. Albuterol nebs will be made available on a prn basis.   Chronic pain Cancer related pain. The patient will be continued on his home pain regimen. He will also have a bowel regimen ordered.    CODE STATUS: Full Code DVT Prophylaxis: None due to severe thrombocytopenia Family Communication: Sister at bedside. From: Home Disposition: Home      Code Status: Full code  Family Communication: Sister at  bedside.  Disposition Plan: Will likely discharge to home   Consultants: Medical oncology  Procedures: None  Antimicrobials: Rocephin IV vancomycin  DVT prophylaxis: SCDs  Status is: Inpatient  Inpatient status.  Patient requires at least 2 midnights for further evaluation and treatment of present condition.      Objective: Vitals:   02/04/21 1712 02/04/21 2059 02/05/21 0622 02/05/21 1300  BP: 105/88 (!) 110/59 (!) 107/50 96/65  Pulse: 94 87 85 79  Resp: 18 18 20 19   Temp: 98.3 F (36.8 C) 98 F (36.7 C) (!) 97.5 F (36.4 C) 97.8 F (36.6 C)  TempSrc: Oral Oral  Oral  SpO2: 100% 96% 95% 95%  Weight:      Height:        Intake/Output Summary (Last 24 hours) at 02/05/2021 1604 Last data filed at 02/05/2021 1425 Gross per 24 hour  Intake 1030.25 ml  Output --  Net 1030.25 ml   Filed Weights   02/03/21 1255  Weight: 103.9 kg    Exam:  General: 65 y.o. year-old male well-developed well-nourished in no acute distress.  He is alert and oriented x3. Cardiovascular: Regular rate and rhythm no rubs or gallops.  Respiratory: Clear to auscultation with no wheezes or rales.  Abdomen: Soft nontender normal bowel sounds present.  Musculoskeletal: Trace lower extremity edema bilaterally.   Skin: Left foot dorsal portion erythema improving, still warm with tenderness.   Psychiatry: Mood is appropriate for condition and setting.  Data Reviewed: CBC: Recent  Labs  Lab 02/03/21 1135 02/04/21 0334 02/04/21 0743 02/05/21 0550  WBC 1.3* 4.8 5.2 12.2*  NEUTROABS 0.7*  --  4.1 10.4*  HGB 7.7* 9.5* 9.1* 9.2*  HCT 22.4* 27.6* 26.3* 26.6*  MCV 88.5 89.0 88.6 89.3  PLT <5* 14* 12* 31*   Basic Metabolic Panel: Recent Labs  Lab 02/03/21 1135 02/04/21 0334 02/05/21 0550  NA 137 139 137  K 3.0* 3.3* 2.6*  CL 97* 101 101  CO2 27 27 25   GLUCOSE 116* 100* 102*  BUN 38* 29* 22  CREATININE 2.21* 1.70* 1.49*  CALCIUM 8.3* 8.0* 7.9*  MG  --   --  1.1*  PHOS  --   --   2.5   GFR: Estimated Creatinine Clearance: 57.7 mL/min (A) (by C-G formula based on SCr of 1.49 mg/dL (H)). Liver Function Tests: Recent Labs  Lab 02/03/21 1135 02/04/21 0334 02/05/21 0550  AST 46* 49* 42*  ALT 23 23 21   ALKPHOS 54 52 58  BILITOT 1.6* 1.9* 0.8  PROT 7.5 6.7 6.2*  ALBUMIN 3.4* 3.0* 2.9*   No results for input(s): LIPASE, AMYLASE in the last 168 hours. No results for input(s): AMMONIA in the last 168 hours. Coagulation Profile: Recent Labs  Lab 02/04/21 0334  INR 1.1   Cardiac Enzymes: No results for input(s): CKTOTAL, CKMB, CKMBINDEX, TROPONINI in the last 168 hours. BNP (last 3 results) No results for input(s): PROBNP in the last 8760 hours. HbA1C: No results for input(s): HGBA1C in the last 72 hours. CBG: No results for input(s): GLUCAP in the last 168 hours. Lipid Profile: No results for input(s): CHOL, HDL, LDLCALC, TRIG, CHOLHDL, LDLDIRECT in the last 72 hours. Thyroid Function Tests: No results for input(s): TSH, T4TOTAL, FREET4, T3FREE, THYROIDAB in the last 72 hours. Anemia Panel: No results for input(s): VITAMINB12, FOLATE, FERRITIN, TIBC, IRON, RETICCTPCT in the last 72 hours. Urine analysis:    Component Value Date/Time   COLORURINE YELLOW 11/18/2020 0930   APPEARANCEUR CLOUDY (A) 11/18/2020 0930   LABSPEC 1.030 11/18/2020 0930   PHURINE 6.0 11/18/2020 0930   GLUCOSEU NEGATIVE 11/18/2020 0930   HGBUR NEGATIVE 11/18/2020 0930   BILIRUBINUR NEGATIVE 11/18/2020 0930   KETONESUR NEGATIVE 11/18/2020 0930   PROTEINUR NEGATIVE 11/18/2020 0930   NITRITE NEGATIVE 11/18/2020 0930   LEUKOCYTESUR NEGATIVE 11/18/2020 0930   Sepsis Labs: @LABRCNTIP (procalcitonin:4,lacticidven:4)  ) Recent Results (from the past 240 hour(s))  Culture, blood (x 2)     Status: None (Preliminary result)   Collection Time: 02/03/21  9:06 PM   Specimen: BLOOD  Result Value Ref Range Status   Specimen Description   Final    BLOOD BLOOD RIGHT HAND Performed at  [facility], [place] [address], [place], [place] [address]    Special Requests   Final    BOTTLES DRAWN AEROBIC AND ANAEROBIC Blood Culture adequate volume Performed at [facility] [address], [place], [place] [address]    Culture   Final    NO GROWTH 1 DAY Performed at [facility], [place] [address], [place], [place] [address]    Report Status PENDING  Incomplete  Culture, blood (x 2)     Status: None (Preliminary result)   Collection Time: 02/03/21  9:06 PM   Specimen: BLOOD  Result Value Ref Range Status   Specimen Description   Final    BLOOD BLOOD LEFT FOREARM Performed at [place] [address] [place], [place] [address]  Special Requests   Final    BOTTLES DRAWN AEROBIC AND ANAEROBIC Blood Culture adequate volume Performed at [place] [address], [place], [place] [address]    Culture   Final    NO GROWTH 1 DAY Performed at [facility], [place] [address], [place], [place] [address]    Report Status PENDING  Incomplete  Resp Panel by RT-PCR (Flu A&B, Covid) Nasopharyngeal Swab     Status: None   Collection Time: 02/03/21 11:21 PM   Specimen: Nasopharyngeal Swab; Nasopharyngeal(NP) swabs in vial transport medium  Result Value Ref Range Status   SARS Coronavirus 2 by RT PCR NEGATIVE NEGATIVE Final    Comment: (NOTE) SARS-CoV-2 target nucleic acids are NOT DETECTED.  The SARS-CoV-2 RNA is generally detectable in upper respiratory specimens during the acute phase of infection. The lowest concentration of SARS-CoV-2 viral copies this assay can detect is 138 copies/mL. A negative result does not preclude SARS-Cov-2 infection and should not be used as the sole basis for treatment or other patient management decisions. A negative result may occur with  improper specimen collection/handling, submission of specimen other than nasopharyngeal swab, presence of viral  mutation(s) within the areas targeted by this assay, and inadequate number of viral copies(<138 copies/mL). A negative result must be combined with clinical observations, patient history, and epidemiological information. The expected result is Negative.  Fact Sheet for Patients:  EntrepreneurPulse.com.au  Fact Sheet for Healthcare Providers:  IncredibleEmployment.be  This test is no t yet approved or cleared by the [place] FDA and  has been authorized for detection and/or diagnosis of SARS-CoV-2 by FDA under an Emergency Use Authorization (EUA). This EUA will remain  in effect (meaning this test can be used) for the duration of the COVID-19 declaration under Section 564(b)(1) of the Act, 21 U.S.C.section 360bbb-3(b)(1), unless the authorization is terminated  or revoked sooner.       Influenza A by PCR NEGATIVE NEGATIVE Final   Influenza B by PCR NEGATIVE NEGATIVE Final    Comment: (NOTE) The Xpert Xpress SARS-CoV-2/FLU/RSV plus assay is intended as an aid in the diagnosis of influenza from Nasopharyngeal swab specimens and should not be used as a sole basis for treatment. Nasal washings and aspirates are unacceptable for Xpert Xpress SARS-CoV-2/FLU/RSV testing.  Fact Sheet for Patients: EntrepreneurPulse.com.au  Fact Sheet for Healthcare Providers: IncredibleEmployment.be  This test is not yet approved or cleared by the [place] FDA and has been authorized for detection and/or diagnosis of SARS-CoV-2 by FDA under an Emergency Use Authorization (EUA). This EUA will remain in effect (meaning this test can be used) for the duration of the COVID-19 declaration under Section 564(b)(1) of the Act, 21 U.S.C. section 360bbb-3(b)(1), unless the authorization is terminated or revoked.  Performed at [facility], [place] [address], [place], [place] [address]   MRSA Next Gen by PCR,  Nasal     Status: None   Collection Time: 02/04/21  4:37 PM   Specimen: Nasal Mucosa; Nasal Swab  Result Value Ref Range Status   MRSA by PCR Next Gen NOT DETECTED NOT DETECTED Final    Comment: (NOTE) The GeneXpert MRSA Assay (FDA approved for NASAL specimens only), is one component of a comprehensive MRSA colonization surveillance program. It is not intended to diagnose MRSA infection nor to guide or monitor treatment for MRSA infections. Test performance is not FDA approved in patients less than 71 years old. Performed at [facility], [place]  [address] [place], [place] [address]       Studies: CT ABDOMEN PELVIS WO CONTRAST  Result Date: 02/05/2021 CLINICAL DATA:  Lung cancer restaging EXAM: CT ABDOMEN AND PELVIS WITHOUT CONTRAST TECHNIQUE: Multidetector CT imaging of the abdomen and pelvis was performed following the standard protocol without IV contrast. COMPARISON:  CT abdomen and pelvis 12/04/2020 FINDINGS: Hepatobiliary: Liver is enlarged measuring 18.8 cm in length. No suspicious hepatic mass visualized. Several stones identified in the dependent gallbladder. No gallbladder wall thickening or pericholecystic edema appreciated. Multiple calcific densities identified in the common bile duct consistent with choledocholithiasis, measuring up to 1 cm in size. The common bile duct measures approximately 12 mm in diameter. Similar to previous study. Pancreas: Atrophic with no suspicious mass or ductal dilatation identified. Spleen: Enlarged measuring 13.8 cm in length. Adrenals/Urinary Tract: Right adrenal gland is normal. Interval decreased size of the left adrenal gland mass now measuring 3.8 x 4.1 cm. No nephrolithiasis or hydronephrosis identified bilaterally. A few hypodense renal cortical cysts identified measuring up to 2.1 cm on the right and 3.1 cm on the left. Tiny calcific densities again seen in the region of the left renal pelvis which are nonspecific. Bilateral  perinephric fat stranding which is mildly increased since previous study and nonspecific. Mild urinary bladder wall thickening. Stomach/Bowel: No bowel obstruction, free air or pneumatosis. Colonic diverticulosis. No bowel wall edema. Vascular/Lymphatic: Aortic atherosclerosis. No enlarged abdominal or pelvic lymph nodes. Reproductive: Prostate is unremarkable. Other: No ascites. Musculoskeletal: No suspicious bony lesions identified. IMPRESSION: 1. No new mass or lymphadenopathy identified in the abdomen or pelvis. 2. Interval decreased size of the left adrenal gland mass now measuring up to 4.1 cm. 3. Hepatosplenomegaly. 4. Cholelithiasis and choledocholithiasis. Correlate clinically for need for ERCP. 5. Other ancillary findings as described. Electronically Signed   By: Ofilia Neas M.D.   On: 02/05/2021 12:49   CT CHEST WO CONTRAST  Result Date: 02/05/2021 CLINICAL DATA:  Lung cancer, evaluate treatment response EXAM: CT CHEST WITHOUT CONTRAST TECHNIQUE: Multidetector CT imaging of the chest was performed following the standard protocol without IV contrast. COMPARISON:  CT chest 12/04/2020 FINDINGS: Cardiovascular: Heart size is normal. No pericardial effusion identified. Coronary artery calcifications noted. Main pulmonary artery is upper normal caliber. Thoracic aorta is normal caliber with moderate calcified plaques. Right-sided central venous port. Mediastinum/Nodes: No axillary or mediastinal lymphadenopathy identified. Stable appearing enlarged lymph node in the right hilum measuring 1.9 cm. Lungs/Pleura: Hyperinflated lungs with mild emphysematous changes. Interval decreased size of an amorphous cavitary mass at the right lung base near the major fissure now measuring approximally 2.7 x 2.1 cm in axial dimensions. Redemonstration of several small pulmonary nodules which are stable in size and measure up to 6 mm in the right lower lobe near the major fissure, 9 mm pleural-based in the right middle  lobe, and 5 mm in the left lower lobe. No new or significantly enlarged nodules identified. Trace bilateral pleural effusions right greater than left. No pneumothorax. Musculoskeletal: No suspicious bony lesions identified. IMPRESSION: 1. Evidence of treatment response. Decreased size of the dominant cavitary mass lesion at the right lung base since previous study. Stable additional smaller pulmonary nodules. 2. Stable appearing enlarged right hilar lymph node. 3. Other chronic findings as described. Aortic Atherosclerosis (ICD10-I70.0) and Emphysema (ICD10-J43.9). Electronically Signed   By: Ofilia Neas M.D.   On: 02/05/2021 13:03    Scheduled Meds:  allopurinol  200 mg Oral Daily   camphor-menthol   Topical QID  cephALEXin  500 mg Oral Q6H   Chlorhexidine Gluconate Cloth  6 each Topical Daily   feeding supplement  237 mL Oral BID BM   folic acid  1 mg Oral Daily   metoprolol tartrate  25 mg Oral BID   morphine  60 mg Oral Q12H   multivitamin with minerals  1 tablet Oral Daily   potassium chloride SA  20 mEq Oral Daily   potassium chloride  40 mEq Oral TID   rosuvastatin  20 mg Oral Daily    Continuous Infusions:  lactated ringers 75 mL/hr at 02/04/21 1353     LOS: 2 days     Kayleen Memos, MD [facility] Pager [phone]  If 7PM-7AM, please contact night-coverage [email or web address] Password [facility] 02/05/2021, 4:04 PM

## 2021-02-05 NOTE — Progress Notes (Signed)
Initial Nutrition Assessment  DOCUMENTATION CODES:   Obesity unspecified  INTERVENTION:   -Ensure Enlive po BID, each supplement provides 350 kcal and 20 grams of protein   -Multivitamin with minerals daily  -Changed preferences in [facility] (meal ordering system)  NUTRITION DIAGNOSIS:   Increased nutrient needs related to cancer and cancer related treatments as evidenced by estimated needs.  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 Admitted for thrombocytopenia.  Patient in room with sister at bedside. Pt about to start eating lunch tray. Pt received house tray and received fish, broccoli, mac and cheese and fresh fruit. Pt states he does not and will not eat fish, and he won't eat the sides that share the plate with the fish. Pt received a salad as well and he states he can't eat this as he has no teeth. Ate fresh fruit with no problem. Added these items to dislikes on meal ordering system. Pt drank all of his milk during visit. Pt is willing to try Ensure supplements as well.   Pt with visible rash on skin. Began scratching uncontrollably. Cream at bedside for relief.  Per weight records, pt has lost 9 lbs since 10/26 (3% wt loss x 1 month, insignificant for time frame).  Medications: Folic acid, KLOR-CON, IV Mg sulfate  Labs reviewed:  Low K, Mg  NUTRITION - FOCUSED PHYSICAL EXAM:  Flowsheet Row Most Recent Value  Orbital Region Moderate depletion  Upper Arm Region No depletion  Thoracic and Lumbar Region No depletion  Buccal Region No depletion  Temple Region Mild depletion  Clavicle Bone Region No depletion  Clavicle and Acromion Bone Region No  depletion  Scapular Bone Region No depletion  Dorsal Hand No depletion  Patellar Region Unable to assess  Anterior Thigh Region Unable to assess  Posterior Calf Region Unable to assess  Edema (RD Assessment) Mild  Hair Reviewed  Eyes Reviewed  Mouth Reviewed  [no teeth]  Skin Reviewed  [petechiae, red]       Diet Order:   Diet Order             Diet Heart Room service appropriate? Yes; Fluid consistency: Thin  Diet effective now                   EDUCATION NEEDS:   No education needs have been identified at this time  Skin:  Skin Assessment: Reviewed RN Assessment  Last BM:  12/1  Height:   Ht Readings from Last 1 Encounters:  02/03/21 _0  (1.727 m)    Weight:   Wt Readings from Last 1 Encounters:  02/03/21 103.9 kg    BMI:  Body mass index is 34.82 kg/m.  Estimated Nutritional Needs:   Kcal:  2300-2500  Protein:  105-120g  Fluid:  2.3L/day   Clayton Bibles, MS, RD, LDN Inpatient Clinical Dietitian Contact information available via Amion

## 2021-02-05 NOTE — Evaluation (Signed)
Physical Therapy Evaluation Patient Details Name: [name] [name] MRN: [id number] DOB: 1956-02-26 Today's Date: 02/05/2021  History of Present Illness  Pt is 65 yo male admitted on 02/03/21 with chemotherapy induced pancytopenia and L foot cellulitis.  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  Clinical Impression   Pt admitted with above diagnosis.  At baseline, he has some assist with IADLs (meals on wheels) but is independent with ADLs, ambulation, and denies falls.  Today, pt transferring and ambulating 200' safely with supervision.  Pt reports he feels safe with mobility and overall disinterested in therapy during session.  Pt demonstrates safe mobility near baseline level - no further PT needs.        Recommendations for follow up therapy are one component of a multi-disciplinary discharge planning process, led by the attending physician.  Recommendations may be updated based on patient status, additional functional criteria and insurance authorization.  Follow Up Recommendations No PT follow up    Assistance Recommended at Discharge PRN  Functional Status Assessment Patient has not had a recent decline in their functional status  Equipment Recommendations  None recommended by PT    Recommendations for Other Services       Precautions / Restrictions Precautions Precautions: None      Mobility  Bed Mobility Overal bed mobility: Needs Assistance Bed Mobility: Rolling;Supine to Sit;Sit to Supine Rolling: Independent   Supine to sit: Independent Sit to supine: Independent        Transfers Overall transfer level: Needs assistance Equipment used: None Transfers: Sit to/from Stand Sit to Stand: Supervision                Ambulation/Gait Ambulation/Gait assistance: Supervision Gait Distance (Feet): 200 Feet Assistive device: Rolling walker (2  wheels);None Gait Pattern/deviations: WFL(Within Functional Limits) Gait velocity: normal     General Gait Details: Started with RW progressed to no AD; steady no LOB  Stairs            Wheelchair Mobility    Modified Rankin (Stroke Patients Only)       Balance Overall balance assessment: Independent Sitting-balance support: No upper extremity supported Sitting balance-Leahy Scale: Normal     Standing balance support: No upper extremity supported Standing balance-Leahy Scale: Good                               Pertinent Vitals/Pain Pain Assessment: 0-10 Pain Location: L great toe and generalized Pain Descriptors / Indicators: Discomfort Pain Intervention(s): Limited activity within patient's tolerance;Monitored during session    Home Living Family/patient expects to be discharged to:: Private residence Living Arrangements: Alone Available Help at Discharge: Family;Available PRN/intermittently Type of Home: House Home Access: Ramped entrance       Home Layout: One level Home Equipment: Grab bars - tub/shower;Air cabin crew (4 wheels) Additional Comments: Pt overall not interested in therapy and providing very brief and limited answers to PLOF/home environment    Prior Function               Mobility Comments: Can ambulate short community distances; typically without AD ADLs Comments: Pt not driving or cooking - getting meals on wheels     Hand Dominance        Extremity/Trunk Assessment   Upper Extremity Assessment Upper Extremity Assessment: Overall WFL for tasks assessed    Lower Extremity Assessment  Lower Extremity Assessment: Overall WFL for tasks assessed (L LE cellulitis/edema)    Cervical / Trunk Assessment Cervical / Trunk Assessment: Kyphotic  Communication   Communication: No difficulties  Cognition Arousal/Alertness: Awake/alert Behavior During Therapy:  (irritated) Overall Cognitive Status: Within  Functional Limits for tasks assessed                                 General Comments: Pt overall disinterested in therapy but did agree to eval.        General Comments General comments (skin integrity, edema, etc.): VSS    Exercises     Assessment/Plan    PT Assessment Patient does not need any further PT services  PT Problem List         PT Treatment Interventions      PT Goals (Current goals can be found in the Care Plan section)  Acute Rehab PT Goals Patient Stated Goal: get some rest PT Goal Formulation: All assessment and education complete, DC therapy    Frequency     Barriers to discharge        Co-evaluation               AM-PAC PT "6 Clicks" Mobility  Outcome Measure Help needed turning from your back to your side while in a flat bed without using bedrails?: None Help needed moving from lying on your back to sitting on the side of a flat bed without using bedrails?: None Help needed moving to and from a bed to a chair (including a wheelchair)?: None Help needed standing up from a chair using your arms (e.g., wheelchair or bedside chair)?: A Little Help needed to walk in hospital room?: A Little Help needed climbing 3-5 steps with a railing? : A Little 6 Click Score: 21    End of Session Equipment Utilized During Treatment: Gait belt Activity Tolerance: Patient tolerated treatment well Patient left: in bed;with bed alarm set;with call bell/phone within reach Nurse Communication: Mobility status PT Visit Diagnosis: Other abnormalities of gait and mobility (R26.89)    Time: 1444-1500 PT Time Calculation (min) (ACUTE ONLY): 16 min   Charges:   PT Evaluation $PT Eval Low Complexity: 1 Low          Iniya Matzek, PT Acute Rehab Services Pager [phone] [facility] [phone]   Karlton Lemon 02/05/2021, 3:07 PM

## 2021-02-06 DIAGNOSIS — D61810 Antineoplastic chemotherapy induced pancytopenia: Secondary | ICD-10-CM | POA: Diagnosis not present

## 2021-02-06 DIAGNOSIS — T451X5A Adverse effect of antineoplastic and immunosuppressive drugs, initial encounter: Secondary | ICD-10-CM | POA: Diagnosis not present

## 2021-02-06 LAB — GASTROINTESTINAL PANEL BY PCR, STOOL (REPLACES STOOL CULTURE)

## 2021-02-06 LAB — BASIC METABOLIC PANEL
Anion gap: 6 (ref 5–15)
BUN: 16 mg/dL (ref 8–23)
CO2: 28 mmol/L (ref 22–32)
Calcium: 8 mg/dL — ABNORMAL LOW (ref 8.9–10.3)
Chloride: 106 mmol/L (ref 98–111)
Creatinine, Ser: 1.3 mg/dL — ABNORMAL HIGH (ref 0.61–1.24)
GFR, Estimated: >60 mL/min (ref >60)
Glucose, Bld: 111 mg/dL — ABNORMAL HIGH (ref 70–99)
Potassium: 3.2 mmol/L — ABNORMAL LOW (ref 3.5–5.1)
Sodium: 140 mmol/L (ref 135–145)

## 2021-02-06 LAB — CBC WITH DIFFERENTIAL/PLATELET
Abs Immature Granulocytes: 0.12 10*3/uL — ABNORMAL HIGH (ref 0.00–0.07)
Basophils Absolute: 0 10*3/uL (ref 0.0–0.1)
Basophils Relative: 0 %
Eosinophils Absolute: 0.2 10*3/uL (ref 0.0–0.5)
Eosinophils Relative: 1 %
HCT: 25.7 % — ABNORMAL LOW (ref 39.0–52.0)
Hemoglobin: 8.5 g/dL — ABNORMAL LOW (ref 13.0–17.0)
Immature Granulocytes: 1 %
Lymphocytes Relative: 8 %
Lymphs Abs: 1.2 10*3/uL (ref 0.7–4.0)
MCH: 30.8 pg (ref 26.0–34.0)
MCHC: 33.1 g/dL (ref 30.0–36.0)
MCV: 93.1 fL (ref 80.0–100.0)
Monocytes Absolute: 1 10*3/uL (ref 0.1–1.0)
Monocytes Relative: 7 %
Neutro Abs: 12.6 10*3/uL — ABNORMAL HIGH (ref 1.7–7.7)
Neutrophils Relative %: 83 %
Platelets: 38 10*3/uL — ABNORMAL LOW (ref 150–400)
RBC: 2.76 MIL/uL — ABNORMAL LOW (ref 4.22–5.81)
RDW: 16.4 % — ABNORMAL HIGH (ref 11.5–15.5)
WBC: 15 10*3/uL — ABNORMAL HIGH (ref 4.0–10.5)
nRBC: 0 % (ref 0.0–0.2)

## 2021-02-06 LAB — HEPATIC FUNCTION PANEL
ALT: 22 U/L (ref 0–44)
AST: 39 U/L (ref 15–41)
Albumin: 2.9 g/dL — ABNORMAL LOW (ref 3.5–5.0)
Alkaline Phosphatase: 67 U/L (ref 38–126)
Bilirubin, Direct: 0.2 mg/dL (ref 0.0–0.2)
Indirect Bilirubin: 0.5 mg/dL (ref 0.3–0.9)
Total Bilirubin: 0.7 mg/dL (ref 0.3–1.2)
Total Protein: 6.1 g/dL — ABNORMAL LOW (ref 6.5–8.1)

## 2021-02-06 LAB — MAGNESIUM: Magnesium: 2 mg/dL (ref 1.7–2.4)

## 2021-02-06 MED ORDER — POTASSIUM CHLORIDE CRYS ER 20 MEQ PO TBCR
40.0000 meq | EXTENDED_RELEASE_TABLET | Freq: Every day | ORAL | Status: DC
  Administered 2021-02-06 – 2021-02-09 (×4): 40 meq via ORAL
  Filled 2021-02-06 (×4): qty 2

## 2021-02-06 NOTE — Progress Notes (Signed)
NP on-call notified of pt potassium 3.2 this am, no new order received. Will continue to closely monitor pt. Delia Heady RN

## 2021-02-06 NOTE — Progress Notes (Signed)
PROGRESS NOTE  [name] [name] DTO:[id number] DOB: 1956-02-17 DOA: 02/03/2021 PCP: [name], MD  HPI/Recap of past 24 hours: 65 yr old man who is receiving palliative chemotherapy for stage IV (T3, N2, M1c) NSCLCA with malignant lymphadenopathy in his chest, unable to differentiate between squamous cell carcinoma and adenocarcinoma, who presented to [facility] ED due to pancytopenia.  Labs performed at the cancer center the day prior to admission showed platelet count less than 5000 he was advised to go to the ED for evaluation because he was also having blood in his stools and dizziness.  He received 2 units PRBCs and 2 units of platelets.  Hemoglobin improved to 9.1 and platelet count improved to 38,000.  Patient will call for found to have left foot pharyngitis, started on IV antibiotics and.    He had CT scans for staging on 02/05/2021 which showed cholelithiasis and choledocholithiasis.  LFTs are normal.  He is asymptomatic.  GI consulted to assist with management of choledocholithiasis.  Assessment/Plan: Principal Problem:   Antineoplastic chemotherapy induced pancytopenia (HCC) Active Problems:   Essential hypertension   Chronic pain   Anxiety   Right lower lobe lung mass   Thrombocytopenia (HCC)   COPD (chronic obstructive pulmonary disease) (HCC)   Pancytopenia (HCC)   Pancytopenia due to antineoplastic chemotherapy (HCC)  Thrombocytopenia (HCC) post 2 units platelets transfusion. Chemotherapy-induced pancytopenia The patient has severe pancytopenia due to adverse reaction of a chemotherapeutic agent used to provide palliative chemotherapy fo rhis stage IV (T3,N2,M1c) NSCLCA with left adrenal metastasis and now malignant thoracic lymphadenopathy.  He is receiving Alimta 500 mg/M2, Carboplatin, and Keytruda. It is thought that the cytopenia is due to the Peralta. The plan going forward will be to discontinue this agent. The patient has been sent to the hospital as a direct admit  due to report of <5 platelets on labwork today. The patient has diffuse petechiae including coalescence of petechiae on his lower extremities bilaterally. His hemoglobin is 7.7. WBC is 1.3 with an ANC of 0.7.  He received transfusion on 01/27/2021 when his hemoglobin was 6.8. The patient has received one dose of Granix on the day of admission. Dr. Mora Appl wanted the patient to receive a second dose in the am. He received transfusion of 2 units of PRBC's. The patient has also received 2 pheresis packs of platelets.  Seen by medical oncology. Platelet count stable 38,000. WBC 15,000, hemoglobin 8.5. Management per medical oncology.  Incidentally found cholelithiasis/choledocholithiasis LFTs are normal, asymptomatic Bilirubin normal Continue to monitor GI-consulted Repeat CMP in the morning  Refractory hypokalemia Serum potassium 3.2 from 2.6 Continue to replete orally. Magnesium 2.0.  Left foot cellulitis, improving, POA MRSA screening negative Blood cultures negative to date Received Rocephin and IV vancomycin Keflex started on 02/23/2021.  Resolved AKI, suspect prerenal in the setting of dehydration. Presented with creatinine of 2.21 Repeated creatinine 1.30 from 1.49 from 1.7 Avoid nephrotoxic agents, dehydration and hypotension. Continue IV fluid hydration LR 75 cc/h x 2 days, continue.  Resolved post repletion: Hypomagnesemia Serum magnesium 2.0 from 1.1.  Right lower lobe lung mass NSCLCA: stage IV (T3,N2,M1c) with malignant lymphadenopathy   Chronic anxiety/depression Continue home regimen  Resolved elevated liver chemistries AST and T bilirubin have normalized. Monitor and repeat CMP in the morning   COPD (chronic obstructive pulmonary disease) (HCC) Stable. Albuterol nebs will be made available on a prn basis.   Chronic pain Cancer related pain. The patient will be continued on his home pain regimen. He  will also have a bowel regimen ordered.    CODE STATUS:  Full Code DVT Prophylaxis: None due to severe thrombocytopenia Family Communication: Sister at bedside. From: Home Disposition: Home      Code Status: Full code  Family Communication: Sister at bedside.  Disposition Plan: Will likely discharge to home   Consultants: Medical oncology  Procedures: None  Antimicrobials: Rocephin IV vancomycin Keflex started on 02/06/2021.  DVT prophylaxis: SCDs  Status is: Inpatient  Inpatient status.  Patient requires at least 2 midnights for further evaluation and treatment of present condition.      Objective: Vitals:   02/05/21 1300 02/05/21 2208 02/06/21 0714 02/06/21 1336  BP: 96/65 (!) 129/106 104/61 (!) 97/42  Pulse: 79 91 77 76  Resp: 19 20 20 18   Temp: 97.8 F (36.6 C) 98.6 F (37 C) 98.5 F (36.9 C) 98 F (36.7 C)  TempSrc: Oral Oral Oral Oral  SpO2: 95% 94% 97% 95%  Weight:      Height:        Intake/Output Summary (Last 24 hours) at 02/06/2021 1704 Last data filed at 02/06/2021 1100 Gross per 24 hour  Intake 360 ml  Output --  Net 360 ml   Filed Weights   02/03/21 1255  Weight: 103.9 kg    Exam:  General: 65 y.o. year-old male well-developed well-nourished in no acute distress.  He is alert and oriented x3.   Cardiovascular: Regular rate and rhythm no rubs or gallops.  No JVD or thyromegaly noted. Respiratory: Clear to auscultation with no wheezes or rales.  Abdomen: Soft nontender normal bowel sounds present.   Musculoskeletal: Trace lower extremity edema bilaterally.  Skin: Left foot dorsum cellulitis improving.   Psychiatry: Mood is appropriate for condition and setting.  Data Reviewed: CBC: Recent Labs  Lab 02/03/21 1135 02/04/21 0334 02/04/21 0743 02/05/21 0550 02/06/21 0302  WBC 1.3* 4.8 5.2 12.2* 15.0*  NEUTROABS 0.7*  --  4.1 10.4* 12.6*  HGB 7.7* 9.5* 9.1* 9.2* 8.5*  HCT 22.4* 27.6* 26.3* 26.6* 25.7*  MCV 88.5 89.0 88.6 89.3 93.1  PLT <5* 14* 12* 31* 38*   Basic Metabolic  Panel: Recent Labs  Lab 02/03/21 1135 02/04/21 0334 02/05/21 0550 02/06/21 0302  NA 137 139 137 140  K 3.0* 3.3* 2.6* 3.2*  CL 97* 101 101 106  CO2 27 27 25 28   GLUCOSE 116* 100* 102* 111*  BUN 38* 29* 22 16  CREATININE 2.21* 1.70* 1.49* 1.30*  CALCIUM 8.3* 8.0* 7.9* 8.0*  MG  --   --  1.1* 2.0  PHOS  --   --  2.5  --    GFR: Estimated Creatinine Clearance: 66.2 mL/min (A) (by C-G formula based on SCr of 1.3 mg/dL (H)). Liver Function Tests: Recent Labs  Lab 02/03/21 1135 02/04/21 0334 02/05/21 0550 02/06/21 0255  AST 46* 49* 42* 39  ALT 23 23 21 22   ALKPHOS 54 52 58 67  BILITOT 1.6* 1.9* 0.8 0.7  PROT 7.5 6.7 6.2* 6.1*  ALBUMIN 3.4* 3.0* 2.9* 2.9*   No results for input(s): LIPASE, AMYLASE in the last 168 hours. No results for input(s): AMMONIA in the last 168 hours. Coagulation Profile: Recent Labs  Lab 02/04/21 0334  INR 1.1   Cardiac Enzymes: No results for input(s): CKTOTAL, CKMB, CKMBINDEX, TROPONINI in the last 168 hours. BNP (last 3 results) No results for input(s): PROBNP in the last 8760 hours. HbA1C: No results for input(s): HGBA1C in the last 72 hours. CBG: No  results for input(s): GLUCAP in the last 168 hours. Lipid Profile: No results for input(s): CHOL, HDL, LDLCALC, TRIG, CHOLHDL, LDLDIRECT in the last 72 hours. Thyroid Function Tests: No results for input(s): TSH, T4TOTAL, FREET4, T3FREE, THYROIDAB in the last 72 hours. Anemia Panel: No results for input(s): VITAMINB12, FOLATE, FERRITIN, TIBC, IRON, RETICCTPCT in the last 72 hours. Urine analysis:    Component Value Date/Time   COLORURINE YELLOW 11/18/2020 0930   APPEARANCEUR CLOUDY (A) 11/18/2020 0930   LABSPEC 1.030 11/18/2020 0930   PHURINE 6.0 11/18/2020 0930   GLUCOSEU NEGATIVE 11/18/2020 0930   HGBUR NEGATIVE 11/18/2020 0930   BILIRUBINUR NEGATIVE 11/18/2020 0930   KETONESUR NEGATIVE 11/18/2020 0930   PROTEINUR NEGATIVE 11/18/2020 0930   NITRITE NEGATIVE 11/18/2020 0930    LEUKOCYTESUR NEGATIVE 11/18/2020 0930   Sepsis Labs: @LABRCNTIP (procalcitonin:4,lacticidven:4)  ) Recent Results (from the past 240 hour(s))  Culture, blood (x 2)     Status: None (Preliminary result)   Collection Time: 02/03/21  9:06 PM   Specimen: BLOOD  Result Value Ref Range Status   Specimen Description   Final    BLOOD BLOOD RIGHT HAND Performed at [facility] [address], [place], [place] [address]    Special Requests   Final    BOTTLES DRAWN AEROBIC AND ANAEROBIC Blood Culture adequate volume Performed at [facility] [address], [place], [place] [address]    Culture   Final    NO GROWTH 2 DAYS Performed at [place] [place] [address] [place], [place] [address]    Report Status PENDING  Incomplete  Culture, blood (x 2)     Status: None (Preliminary result)   Collection Time: 02/03/21  9:06 PM   Specimen: BLOOD  Result Value Ref Range Status   Specimen Description   Final    BLOOD BLOOD LEFT FOREARM Performed at [facility] [address] [place], [place] [address]    Special Requests   Final    BOTTLES DRAWN AEROBIC AND ANAEROBIC Blood Culture adequate volume Performed at [facility] [address] [place], [place] [address]    Culture   Final    NO GROWTH 2 DAYS Performed at [facility] [address], [place], [place] [address]    Report Status PENDING  Incomplete  Resp Panel by RT-PCR (Flu A&B, Covid) Nasopharyngeal Swab     Status: None   Collection Time: 02/03/21 11:21 PM   Specimen: Nasopharyngeal Swab; Nasopharyngeal(NP) swabs in vial transport medium  Result Value Ref Range Status   SARS Coronavirus 2 by RT PCR NEGATIVE NEGATIVE Final    Comment: (NOTE) SARS-CoV-2 target nucleic acids are NOT DETECTED.  The SARS-CoV-2 RNA is generally detectable in upper respiratory specimens during the acute phase of infection. The lowest concentration of  SARS-CoV-2 viral copies this assay can detect is 138 copies/mL. A negative result does not preclude SARS-Cov-2 infection and should not be used as the sole basis for treatment or other patient management decisions. A negative result may occur with  improper specimen collection/handling, submission of specimen other than nasopharyngeal swab, presence of viral mutation(s) within the areas targeted by this assay, and inadequate number of viral copies(<138 copies/mL). A negative result must be combined with clinical observations, patient history, and epidemiological information. The expected result is Negative.  Fact Sheet for Patients:  EntrepreneurPulse.com.au  Fact Sheet for Healthcare Providers:  IncredibleEmployment.be  This test is no t yet approved or cleared  by the [place] and  has been authorized for detection and/or diagnosis of SARS-CoV-2 by FDA under an Emergency Use Authorization (EUA). This EUA will remain  in effect (meaning this test can be used) for the duration of the COVID-19 declaration under Section 564(b)(1) of the Act, 21 U.S.C.section 360bbb-3(b)(1), unless the authorization is terminated  or revoked sooner.       Influenza A by PCR NEGATIVE NEGATIVE Final   Influenza B by PCR NEGATIVE NEGATIVE Final    Comment: (NOTE) The Xpert Xpress SARS-CoV-2/FLU/RSV plus assay is intended as an aid in the diagnosis of influenza from Nasopharyngeal swab specimens and should not be used as a sole basis for treatment. Nasal washings and aspirates are unacceptable for Xpert Xpress SARS-CoV-2/FLU/RSV testing.  Fact Sheet for Patients: EntrepreneurPulse.com.au  Fact Sheet for Healthcare Providers: IncredibleEmployment.be  This test is not yet approved or cleared by the [place] FDA and has been authorized for detection and/or diagnosis of SARS-CoV-2 by FDA under an Emergency Use  Authorization (EUA). This EUA will remain in effect (meaning this test can be used) for the duration of the COVID-19 declaration under Section 564(b)(1) of the Act, 21 U.S.C. section 360bbb-3(b)(1), unless the authorization is terminated or revoked.  Performed at [facility], [place] [address] [place], [place] [address]   MRSA Next Gen by PCR, Nasal     Status: None   Collection Time: 02/04/21  4:37 PM   Specimen: Nasal Mucosa; Nasal Swab  Result Value Ref Range Status   MRSA by PCR Next Gen NOT DETECTED NOT DETECTED Final    Comment: (NOTE) The GeneXpert MRSA Assay (FDA approved for NASAL specimens only), is one component of a comprehensive MRSA colonization surveillance program. It is not intended to diagnose MRSA infection nor to guide or monitor treatment for MRSA infections. Test performance is not FDA approved in patients less than 62 years old. Performed at [facility], [place] [address], [place], [place] [address]   Gastrointestinal Panel by PCR , Stool     Status: None   Collection Time: 02/05/21  3:39 PM   Specimen: Stool  Result Value Ref Range Status   Campylobacter species NOT DETECTED NOT DETECTED Final   Plesimonas shigelloides NOT DETECTED NOT DETECTED Final   Salmonella species NOT DETECTED NOT DETECTED Final   Yersinia enterocolitica NOT DETECTED NOT DETECTED Final   Vibrio species NOT DETECTED NOT DETECTED Final   Vibrio cholerae NOT DETECTED NOT DETECTED Final   Enteroaggregative E coli (EAEC) NOT DETECTED NOT DETECTED Final   Enteropathogenic E coli (EPEC) NOT DETECTED NOT DETECTED Final   Enterotoxigenic E coli (ETEC) NOT DETECTED NOT DETECTED Final   Shiga like toxin producing E coli (STEC) NOT DETECTED NOT DETECTED Final   Shigella/Enteroinvasive E coli (EIEC) NOT DETECTED NOT DETECTED Final   Cryptosporidium NOT DETECTED NOT DETECTED Final   Cyclospora cayetanensis NOT DETECTED NOT DETECTED Final   Entamoeba  histolytica NOT DETECTED NOT DETECTED Final   Giardia lamblia NOT DETECTED NOT DETECTED Final   Adenovirus F40/41 NOT DETECTED NOT DETECTED Final   Astrovirus NOT DETECTED NOT DETECTED Final   Norovirus GI/GII NOT DETECTED NOT DETECTED Final   Rotavirus A NOT DETECTED NOT DETECTED Final   Sapovirus (I, II, IV, and V) NOT DETECTED NOT DETECTED Final    Comment: Performed at [facility], [address], [place], [place] [address]  C Difficile Quick Screen w PCR reflex     Status: None  Collection Time: 02/05/21  3:39 PM  Result Value Ref Range Status   C Diff antigen NEGATIVE NEGATIVE Final   C Diff toxin NEGATIVE NEGATIVE Final   C Diff interpretation No C. difficile detected.  Final    Comment: Performed at [facility], [place] [address], [place], [place] [address]      Studies: No results found.  Scheduled Meds:  allopurinol  200 mg Oral Daily   camphor-menthol   Topical QID   cephALEXin  500 mg Oral Q6H   Chlorhexidine Gluconate Cloth  6 each Topical Daily   feeding supplement  237 mL Oral BID BM   folic acid  1 mg Oral Daily   metoprolol tartrate  25 mg Oral BID   morphine  60 mg Oral Q12H   multivitamin with minerals  1 tablet Oral Daily   potassium chloride SA  40 mEq Oral Daily   rosuvastatin  20 mg Oral Daily    Continuous Infusions:     LOS: 3 days     Kayleen Memos, MD [facility] Pager [phone]  If 7PM-7AM, please contact night-coverage [email or web address] Password [email or web address] 02/06/2021, 5:04 PM

## 2021-02-07 DIAGNOSIS — D61810 Antineoplastic chemotherapy induced pancytopenia: Secondary | ICD-10-CM | POA: Diagnosis not present

## 2021-02-07 DIAGNOSIS — T451X5A Adverse effect of antineoplastic and immunosuppressive drugs, initial encounter: Secondary | ICD-10-CM | POA: Diagnosis not present

## 2021-02-07 LAB — CBC WITH DIFFERENTIAL/PLATELET
Abs Immature Granulocytes: 0.05 10*3/uL (ref 0.00–0.07)
Basophils Absolute: 0 10*3/uL (ref 0.0–0.1)
Basophils Relative: 0 %
Eosinophils Absolute: 0.2 10*3/uL (ref 0.0–0.5)
Eosinophils Relative: 2 %
HCT: 26.9 % — ABNORMAL LOW (ref 39.0–52.0)
Hemoglobin: 8.7 g/dL — ABNORMAL LOW (ref 13.0–17.0)
Immature Granulocytes: 1 %
Lymphocytes Relative: 13 %
Lymphs Abs: 1.3 10*3/uL (ref 0.7–4.0)
MCH: 30.4 pg (ref 26.0–34.0)
MCHC: 32.3 g/dL (ref 30.0–36.0)
MCV: 94.1 fL (ref 80.0–100.0)
Monocytes Absolute: 0.9 10*3/uL (ref 0.1–1.0)
Monocytes Relative: 9 %
Neutro Abs: 7.1 10*3/uL (ref 1.7–7.7)
Neutrophils Relative %: 75 %
Platelets: 46 10*3/uL — ABNORMAL LOW (ref 150–400)
RBC: 2.86 MIL/uL — ABNORMAL LOW (ref 4.22–5.81)
RDW: 16.3 % — ABNORMAL HIGH (ref 11.5–15.5)
WBC: 9.5 10*3/uL (ref 4.0–10.5)
nRBC: 0 % (ref 0.0–0.2)

## 2021-02-07 LAB — BASIC METABOLIC PANEL
Anion gap: 5 (ref 5–15)
BUN: 18 mg/dL (ref 8–23)
CO2: 28 mmol/L (ref 22–32)
Calcium: 8.3 mg/dL — ABNORMAL LOW (ref 8.9–10.3)
Chloride: 105 mmol/L (ref 98–111)
Creatinine, Ser: 1.35 mg/dL — ABNORMAL HIGH (ref 0.61–1.24)
GFR, Estimated: 58 mL/min — ABNORMAL LOW (ref >60)
Glucose, Bld: 105 mg/dL — ABNORMAL HIGH (ref 70–99)
Potassium: 3.9 mmol/L (ref 3.5–5.1)
Sodium: 138 mmol/L (ref 135–145)

## 2021-02-07 LAB — MAGNESIUM: Magnesium: 1.7 mg/dL (ref 1.7–2.4)

## 2021-02-07 NOTE — Progress Notes (Signed)
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  Labs performed at the cancer center the day prior to admission showed platelet count less than 5000 he was advised to go to the ED for evaluation because he was also having blood in his stools and dizziness.  He received 2 units PRBCs and 2 units of platelets.  Hemoglobin improved to 9.1 and platelet count improved to 38,000.  Patient will call for found to have left foot pharyngitis, started on IV antibiotics and.    He had CT scans for staging on 02/05/2021 which showed cholelithiasis and choledocholithiasis.  LFTs are normal.  He is asymptomatic.  GI consulted to assist with management of choledocholithiasis.  02/07/21: Seen and examined at his bedside.  He has no new complaints.  There were no acute events overnight.  Seen by GI unable to do ERCP at this time due to thrombocytopenia, preferably platelet count should be above 100,000.  Assessment/Plan: Principal Problem:   Antineoplastic chemotherapy induced pancytopenia (HCC) Active Problems:   Essential hypertension   Chronic pain   Anxiety   Right lower lobe lung mass   Thrombocytopenia (HCC)   COPD (chronic obstructive pulmonary disease) (HCC)   Pancytopenia (HCC)   Pancytopenia due to antineoplastic chemotherapy (HCC)  Thrombocytopenia (HCC) post 2 units platelets transfusion. Chemotherapy-induced pancytopenia The patient has severe pancytopenia due to adverse reaction of a chemotherapeutic agent used to provide palliative chemotherapy fo rhis stage IV (T3,N2,M1c) NSCLCA with left adrenal metastasis and now malignant thoracic lymphadenopathy.  He  is receiving Alimta 500 mg/M2, Carboplatin, and Keytruda. It is thought that the cytopenia is due to the [place]. The plan going forward will be to discontinue this agent. The patient has been sent to the hospital as a direct admit due to report of <5 platelets on labwork today. The patient has diffuse petechiae including coalescence of petechiae on his lower extremities bilaterally. His hemoglobin is 7.7. WBC is 1.3 with an ANC of 0.7.  He received transfusion on 01/27/2021 when his hemoglobin was 6.8. The patient has received one dose of Granix on the day of admission. Dr. Mora Appl wanted the patient to receive a second dose in the am. He received transfusion of 2 units of PRBC's. The patient has also received 2 pheresis packs of platelets.  Seen by medical oncology. Rate count improving 58,000. WBC 15,000, hemoglobin 8.5. Management per medical oncology.  Incidentally found cholelithiasis/choledocholithiasis LFTs are normal, asymptomatic Bilirubin normal Continue to monitor GI-consulted, ERCP with platelet count above 100,000. Repeat CBC and CMP in the morning.  Resolved refractory hypokalemia Serum potassium 3.2 from 2.6 Continue to replete orally. Magnesium 2.0.  Improving left foot cellulitis, improving, POA MRSA screening negative Blood cultures negative to date Received Rocephin and IV vancomycin Keflex started on 02/23/2021.  AKI, suspect prerenal in the setting of dehydration. Presented with creatinine of 2.21 Creatinine 1.35 from 1.30 from 1.49 from 1.7 Continue to avoid nephrotoxic agents, dehydration and hypotension. Continue IV fluid hydration LR 75 cc/h x 2 days, continue.  Resolved post repletion: Hypomagnesemia Serum magnesium 2.0 from 1.1.  Right lower lobe lung mass NSCLCA: stage IV (T3,N2,M1c) with malignant lymphadenopathy   Chronic anxiety/depression Continue home regimen  Resolved elevated liver chemistries AST and T bilirubin have normalized. Monitor  and repeat CMP in the morning   COPD (chronic obstructive pulmonary disease) (HCC) Stable. Albuterol nebs will be made available on a prn basis.   Chronic pain Cancer related pain. The patient will be continued on his home pain regimen. He will also have a bowel regimen ordered.    CODE STATUS: Full Code DVT Prophylaxis: None due to severe thrombocytopenia Family Communication: Sister at bedside. From: Home Disposition: Home      Code Status: Full code  Family Communication: Sister at bedside.  Disposition Plan: Will likely discharge to home   Consultants: Medical oncology  Procedures: None  Antimicrobials: Rocephin IV vancomycin Keflex started on 02/06/2021.  DVT prophylaxis: SCDs  Status is: Inpatient  Inpatient status.  Patient requires at least 2 midnights for further evaluation and treatment of present condition.      Objective: Vitals:   02/06/21 1336 02/06/21 2005 02/07/21 0424 02/07/21 1406  BP: (!) 97/42 108/60 108/63 (!) 92/59  Pulse: 76 81 76 81  Resp: 18 16 16 20   Temp: 98 F (36.7 C) 97.8 F (36.6 C) 97.6 F (36.4 C) 97.9 F (36.6 C)  TempSrc: Oral Oral Oral Oral  SpO2: 95% 95% 93% 98%  Weight:      Height:        Intake/Output Summary (Last 24 hours) at 02/07/2021 1616 Last data filed at 02/07/2021 1350 Gross per 24 hour  Intake 720 ml  Output --  Net 720 ml   Filed Weights   02/03/21 1255  Weight: 103.9 kg    Exam:  General: 65 y.o. year-old male well-developed well-nourished in no acute distress.  He is alert oriented x3.   Cardiovascular: Regular rate and rhythm no rubs or gallops.  No JVD or thyromegaly.   Respiratory: Clear to auscultation with no wheezes or rales. Abdomen: Soft monitor normal bowel sounds present.   Musculoskeletal: Trace lower extremity edema bilaterally. Skin: Left foot dorsum cellulitis improving. Psychiatry: Mood is appropriate for condition and setting.  Data Reviewed: CBC: Recent Labs  Lab  02/03/21 1135 02/04/21 0334 02/04/21 0743 02/05/21 0550 02/06/21 0302 02/07/21 0310  WBC 1.3* 4.8 5.2 12.2* 15.0* 9.5  NEUTROABS 0.7*  --  4.1 10.4* 12.6* 7.1  HGB 7.7* 9.5* 9.1* 9.2* 8.5* 8.7*  HCT 22.4* 27.6* 26.3* 26.6* 25.7* 26.9*  MCV 88.5 89.0 88.6 89.3 93.1 94.1  PLT <5* 14* 12* 31* 38* 46*   Basic Metabolic Panel: Recent Labs  Lab 02/03/21 1135 02/04/21 0334 02/05/21 0550 02/06/21 0302 02/07/21 0310  NA 137 139 137 140 138  K 3.0* 3.3* 2.6* 3.2* 3.9  CL 97* 101 101 106 105  CO2 27 27 25 28 28   GLUCOSE 116* 100* 102* 111* 105*  BUN 38* 29* 22 16 18   CREATININE 2.21* 1.70* 1.49* 1.30* 1.35*  CALCIUM 8.3* 8.0* 7.9* 8.0* 8.3*  MG  --   --  1.1* 2.0 1.7  PHOS  --   --  2.5  --   --    GFR: Estimated Creatinine Clearance: 63.7 mL/min (A) (by C-G formula based on SCr of 1.35 mg/dL (H)). Liver Function Tests: Recent Labs  Lab 02/03/21 1135 02/04/21 0334 02/05/21 0550 02/06/21 0255  AST 46* 49* 42* 39  ALT 23 23 21 22   ALKPHOS 54 52 58 67  BILITOT 1.6* 1.9* 0.8 0.7  PROT 7.5 6.7 6.2* 6.1*  ALBUMIN 3.4* 3.0* 2.9* 2.9*   No results for input(s):  LIPASE, AMYLASE in the last 168 hours. No results for input(s): AMMONIA in the last 168 hours. Coagulation Profile: Recent Labs  Lab 02/04/21 0334  INR 1.1   Cardiac Enzymes: No results for input(s): CKTOTAL, CKMB, CKMBINDEX, TROPONINI in the last 168 hours. BNP (last 3 results) No results for input(s): PROBNP in the last 8760 hours. HbA1C: No results for input(s): HGBA1C in the last 72 hours. CBG: No results for input(s): GLUCAP in the last 168 hours. Lipid Profile: No results for input(s): CHOL, HDL, LDLCALC, TRIG, CHOLHDL, LDLDIRECT in the last 72 hours. Thyroid Function Tests: No results for input(s): TSH, T4TOTAL, FREET4, T3FREE, THYROIDAB in the last 72 hours. Anemia Panel: No results for input(s): VITAMINB12, FOLATE, FERRITIN, TIBC, IRON, RETICCTPCT in the last 72 hours. Urine analysis:    Component  Value Date/Time   COLORURINE YELLOW 11/18/2020 0930   APPEARANCEUR CLOUDY (A) 11/18/2020 0930   LABSPEC 1.030 11/18/2020 0930   PHURINE 6.0 11/18/2020 0930   GLUCOSEU NEGATIVE 11/18/2020 0930   HGBUR NEGATIVE 11/18/2020 0930   BILIRUBINUR NEGATIVE 11/18/2020 0930   KETONESUR NEGATIVE 11/18/2020 0930   PROTEINUR NEGATIVE 11/18/2020 0930   NITRITE NEGATIVE 11/18/2020 0930   LEUKOCYTESUR NEGATIVE 11/18/2020 0930   Sepsis Labs: @LABRCNTIP (procalcitonin:4,lacticidven:4)  ) Recent Results (from the past 240 hour(s))  Culture, blood (x 2)     Status: None (Preliminary result)   Collection Time: 02/03/21  9:06 PM   Specimen: BLOOD  Result Value Ref Range Status   Specimen Description   Final    BLOOD BLOOD RIGHT HAND Performed at [facility], [place] [address], [place], [place] [address]    Special Requests   Final    BOTTLES DRAWN AEROBIC AND ANAEROBIC Blood Culture adequate volume Performed at [place] [address] [place], [place] [address]    Culture   Final    NO GROWTH 3 DAYS Performed at [facility], [place] [address] [place], [place] [address]    Report Status PENDING  Incomplete  Culture, blood (x 2)     Status: None (Preliminary result)   Collection Time: 02/03/21  9:06 PM   Specimen: BLOOD  Result Value Ref Range Status   Specimen Description   Final    BLOOD BLOOD LEFT FOREARM Performed at [place] [address], [place], [place] [address]    Special Requests   Final    BOTTLES DRAWN AEROBIC AND ANAEROBIC Blood Culture adequate volume Performed at [place] [address] [place], [place] [address]    Culture   Final    NO GROWTH 3 DAYS Performed at [facility], [place] [address], [place], [place] [address]    Report Status PENDING  Incomplete  Resp Panel by RT-PCR (Flu A&B, Covid) Nasopharyngeal Swab     Status: None   Collection Time: 02/03/21 11:21 PM    Specimen: Nasopharyngeal Swab; Nasopharyngeal(NP) swabs in vial transport medium  Result Value Ref Range Status   SARS Coronavirus 2 by RT PCR NEGATIVE NEGATIVE Final    Comment: (NOTE) SARS-CoV-2 target nucleic acids are NOT DETECTED.  The SARS-CoV-2 RNA is generally detectable in upper respiratory specimens during the acute phase of infection. The lowest concentration of SARS-CoV-2 viral copies this assay can detect is 138 copies/mL. A negative result does not preclude SARS-Cov-2 infection and should not be used as the sole basis for treatment or other patient management decisions. A negative result may occur with  improper specimen collection/handling, submission of specimen other than nasopharyngeal swab, presence of viral mutation(s) within the areas targeted by this assay, and inadequate number of viral copies(<138 copies/mL). A negative result must be combined with clinical observations, patient history, and epidemiological information. The expected result is Negative.  Fact Sheet for Patients:  EntrepreneurPulse.com.au  Fact Sheet for Healthcare Providers:  IncredibleEmployment.be  This test is no t yet approved or cleared by the [place] FDA and  has been authorized for detection and/or diagnosis of SARS-CoV-2 by FDA under an Emergency Use Authorization (EUA). This EUA will remain  in effect (meaning this test can be used) for the duration of the COVID-19 declaration under Section 564(b)(1) of the Act, 21 U.S.C.section 360bbb-3(b)(1), unless the authorization is terminated  or revoked sooner.       Influenza A by PCR NEGATIVE NEGATIVE Final   Influenza B by PCR NEGATIVE NEGATIVE Final    Comment: (NOTE) The Xpert Xpress SARS-CoV-2/FLU/RSV plus assay is intended as an aid in the diagnosis of influenza from Nasopharyngeal swab specimens and should not be used as a sole basis for treatment. Nasal washings and aspirates are  unacceptable for Xpert Xpress SARS-CoV-2/FLU/RSV testing.  Fact Sheet for Patients: EntrepreneurPulse.com.au  Fact Sheet for Healthcare Providers: IncredibleEmployment.be  This test is not yet approved or cleared by the [place] FDA and has been authorized for detection and/or diagnosis of SARS-CoV-2 by FDA under an Emergency Use Authorization (EUA). This EUA will remain in effect (meaning this test can be used) for the duration of the COVID-19 declaration under Section 564(b)(1) of the Act, 21 U.S.C. section 360bbb-3(b)(1), unless the authorization is terminated or revoked.  Performed at [facility], [place] [address] [place], [place] [address]   MRSA Next Gen by PCR, Nasal     Status: None   Collection Time: 02/04/21  4:37 PM   Specimen: Nasal Mucosa; Nasal Swab  Result Value Ref Range Status   MRSA by PCR Next Gen NOT DETECTED NOT DETECTED Final    Comment: (NOTE) The GeneXpert MRSA Assay (FDA approved for NASAL specimens only), is one component of a comprehensive MRSA colonization surveillance program. It is not intended to diagnose MRSA infection nor to guide or monitor treatment for MRSA infections. Test performance is not FDA approved in patients less than 64 years old. Performed at [facility], [place] [address] [place], [place] [address]   Gastrointestinal Panel by PCR , Stool     Status: None   Collection Time: 02/05/21  3:39 PM   Specimen: Stool  Result Value Ref Range Status   Campylobacter species NOT DETECTED NOT DETECTED Final   Plesimonas shigelloides NOT DETECTED NOT DETECTED Final   Salmonella species NOT DETECTED NOT DETECTED Final   Yersinia enterocolitica NOT DETECTED NOT DETECTED Final   Vibrio species NOT DETECTED NOT DETECTED Final   Vibrio cholerae NOT DETECTED NOT DETECTED Final   Enteroaggregative E coli (EAEC) NOT DETECTED NOT DETECTED Final   Enteropathogenic E coli  (EPEC) NOT DETECTED NOT DETECTED Final   Enterotoxigenic E coli (ETEC) NOT DETECTED NOT DETECTED Final   Shiga like toxin producing E coli (STEC) NOT DETECTED NOT DETECTED Final   Shigella/Enteroinvasive E coli (EIEC) NOT DETECTED NOT DETECTED Final   Cryptosporidium NOT DETECTED NOT DETECTED Final   Cyclospora cayetanensis NOT DETECTED NOT DETECTED Final   Entamoeba histolytica NOT DETECTED NOT DETECTED Final   Giardia lamblia NOT DETECTED NOT DETECTED Final   Adenovirus F40/41 NOT DETECTED  NOT DETECTED Final   Astrovirus NOT DETECTED NOT DETECTED Final   Norovirus GI/GII NOT DETECTED NOT DETECTED Final   Rotavirus A NOT DETECTED NOT DETECTED Final   Sapovirus (I, II, IV, and V) NOT DETECTED NOT DETECTED Final    Comment: Performed at [facility], [address], [place], [place] [address]  C Difficile Quick Screen w PCR reflex     Status: None   Collection Time: 02/05/21  3:39 PM  Result Value Ref Range Status   C Diff antigen NEGATIVE NEGATIVE Final   C Diff toxin NEGATIVE NEGATIVE Final   C Diff interpretation No C. difficile detected.  Final    Comment: Performed at [facility], [place] [address], [place], [place] [address]      Studies: No results found.  Scheduled Meds:  allopurinol  200 mg Oral Daily   cephALEXin  500 mg Oral Q6H   Chlorhexidine Gluconate Cloth  6 each Topical Daily   feeding supplement  237 mL Oral BID BM   folic acid  1 mg Oral Daily   metoprolol tartrate  25 mg Oral BID   morphine  60 mg Oral Q12H   multivitamin with minerals  1 tablet Oral Daily   potassium chloride SA  40 mEq Oral Daily   rosuvastatin  20 mg Oral Daily    Continuous Infusions:     LOS: 4 days     Kayleen Memos, MD [facility] Pager [phone]  If 7PM-7AM, please contact night-coverage [email or web address] Password [email or web address] 02/07/2021, 4:16 PM

## 2021-02-07 NOTE — Plan of Care (Signed)
Pt aox4, pleasant and cooperative with staff. Sister at bedside.  Hem/Onc following, Labs continuing to improve.  GI following, plan for ERCP once platelets within safe surgical range.  PO antibiotics per orders for cellulitis. Pain management per orders.    Problem: Safety: Goal: Ability to remain free from injury will improve Outcome: Progressing   Problem: Pain Managment: Goal: General experience of comfort will improve Outcome: Progressing   Problem: Coping: Goal: Level of anxiety will decrease Outcome: Progressing   Problem: Skin Integrity: Goal: Risk for impaired skin integrity will decrease Outcome: Progressing   Problem: Clinical Measurements: Goal: Ability to maintain clinical measurements within normal limits will improve Outcome: Progressing
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
## 2021-02-08 ENCOUNTER — Telehealth: Payer: Self-pay | Admitting: Medical Oncology

## 2021-02-08 ENCOUNTER — Ambulatory Visit (HOSPITAL_COMMUNITY)
Admission: RE | Admit: 2021-02-08 | Discharge: 2021-02-08 | Disposition: A | Discharge status: Home / Self Care | Payer: PPO | Source: Ambulatory Visit | Attending: Physician Assistant | Admitting: Physician Assistant

## 2021-02-08 DIAGNOSIS — T451X5A Adverse effect of antineoplastic and immunosuppressive drugs, initial encounter: Secondary | ICD-10-CM | POA: Diagnosis not present

## 2021-02-08 DIAGNOSIS — D61810 Antineoplastic chemotherapy induced pancytopenia: Secondary | ICD-10-CM | POA: Diagnosis not present

## 2021-02-08 LAB — COMPREHENSIVE METABOLIC PANEL
ALT: 41 U/L (ref 0–44)
ALT: 43 U/L (ref 0–44)
AST: 64 U/L — ABNORMAL HIGH (ref 15–41)
AST: 66 U/L — ABNORMAL HIGH (ref 15–41)
Albumin: 2.9 g/dL — ABNORMAL LOW (ref 3.5–5.0)
Albumin: 3.2 g/dL — ABNORMAL LOW (ref 3.5–5.0)
Alkaline Phosphatase: 67 U/L (ref 38–126)
Alkaline Phosphatase: 70 U/L (ref 38–126)
Anion gap: 4 — ABNORMAL LOW (ref 5–15)
Anion gap: 12 (ref 5–15)
BUN: 21 mg/dL (ref 8–23)
BUN: 19 mg/dL (ref 8–23)
CO2: 31 mmol/L (ref 22–32)
CO2: 29 mmol/L (ref 22–32)
Calcium: 8.6 mg/dL — ABNORMAL LOW (ref 8.9–10.3)
Calcium: 9.2 mg/dL (ref 8.9–10.3)
Chloride: 105 mmol/L (ref 98–111)
Chloride: 100 mmol/L (ref 98–111)
Creatinine, Ser: 1.36 mg/dL — ABNORMAL HIGH (ref 0.61–1.24)
Creatinine, Ser: 1.35 mg/dL — ABNORMAL HIGH (ref 0.61–1.24)
GFR, Estimated: 58 mL/min — ABNORMAL LOW (ref >60)
GFR, Estimated: 58 mL/min — ABNORMAL LOW (ref >60)
Glucose, Bld: 105 mg/dL — ABNORMAL HIGH (ref 70–99)
Glucose, Bld: 102 mg/dL — ABNORMAL HIGH (ref 70–99)
Potassium: 4.3 mmol/L (ref 3.5–5.1)
Potassium: 4.5 mmol/L (ref 3.5–5.1)
Sodium: 140 mmol/L (ref 135–145)
Sodium: 141 mmol/L (ref 135–145)
Total Bilirubin: 0.7 mg/dL (ref 0.3–1.2)
Total Bilirubin: 0.5 mg/dL (ref 0.3–1.2)
Total Protein: 6 g/dL — ABNORMAL LOW (ref 6.5–8.1)
Total Protein: 6.5 g/dL (ref 6.5–8.1)

## 2021-02-08 LAB — CBC
HCT: 27.7 % — ABNORMAL LOW (ref 39.0–52.0)
Hemoglobin: 8.9 g/dL — ABNORMAL LOW (ref 13.0–17.0)
MCH: 30.3 pg (ref 26.0–34.0)
MCHC: 32.1 g/dL (ref 30.0–36.0)
MCV: 94.2 fL (ref 80.0–100.0)
Platelets: 65 10*3/uL — ABNORMAL LOW (ref 150–400)
RBC: 2.94 MIL/uL — ABNORMAL LOW (ref 4.22–5.81)
RDW: 15.6 % — ABNORMAL HIGH (ref 11.5–15.5)
WBC: 3.9 10*3/uL — ABNORMAL LOW (ref 4.0–10.5)
nRBC: 0 % (ref 0.0–0.2)

## 2021-02-08 LAB — CBC WITH DIFFERENTIAL/PLATELET
Abs Immature Granulocytes: 0.02 10*3/uL (ref 0.00–0.07)
Basophils Absolute: 0 10*3/uL (ref 0.0–0.1)
Basophils Relative: 0 %
Eosinophils Absolute: 0.1 10*3/uL (ref 0.0–0.5)
Eosinophils Relative: 4 %
HCT: 25.3 % — ABNORMAL LOW (ref 39.0–52.0)
Hemoglobin: 8.2 g/dL — ABNORMAL LOW (ref 13.0–17.0)
Immature Granulocytes: 1 %
Lymphocytes Relative: 21 %
Lymphs Abs: 0.8 10*3/uL (ref 0.7–4.0)
MCH: 30.8 pg (ref 26.0–34.0)
MCHC: 32.4 g/dL (ref 30.0–36.0)
MCV: 95.1 fL (ref 80.0–100.0)
Monocytes Absolute: 0.7 10*3/uL (ref 0.1–1.0)
Monocytes Relative: 20 %
Neutro Abs: 2 10*3/uL (ref 1.7–7.7)
Neutrophils Relative %: 54 %
Platelets: 53 10*3/uL — ABNORMAL LOW (ref 150–400)
RBC: 2.66 MIL/uL — ABNORMAL LOW (ref 4.22–5.81)
RDW: 16 % — ABNORMAL HIGH (ref 11.5–15.5)
WBC: 3.6 10*3/uL — ABNORMAL LOW (ref 4.0–10.5)
nRBC: 0 % (ref 0.0–0.2)

## 2021-02-08 LAB — MAGNESIUM: Magnesium: 1.7 mg/dL (ref 1.7–2.4)

## 2021-02-08 LAB — PHOSPHORUS: Phosphorus: 3.8 mg/dL (ref 2.5–4.6)

## 2021-02-08 NOTE — Progress Notes (Signed)
HEMATOLOGY-ONCOLOGY PROGRESS NOTE  SUBJECTIVE: Afebrile.  Denies bleeding.  Reports mild abdominal discomfort which comes and goes.  He has no other complaints this morning.  Oncology History  Non-small cell carcinoma of right lung, stage 4 ([place])  09/29/2020 Initial Diagnosis   Non-small cell carcinoma of right lung, stage 4 ([place])   10/08/2020 - 10/10/2020 Chemotherapy          10/28/2020 -  Chemotherapy   Patient is on Treatment Plan : LUNG CARBOplatin / Pemetrexed / Pembrolizumab q21d Induction x 4 cycles / Maintenance Pemetrexed + Pembrolizumab        REVIEW OF SYSTEMS:   Constitutional: Denies fevers, chills  Eyes: Denies blurriness of vision Ears, nose, mouth, throat, and face: Denies mucositis or sore throat Respiratory: Denies cough, dyspnea or wheezes Cardiovascular: Denies palpitation, chest discomfort Gastrointestinal:  Denies nausea, heartburn or change in bowel habits Skin: Reports rash to his legs and arms Lymphatics: Denies new lymphadenopathy, he bruises easily Neurological:Denies numbness, tingling or new weaknesses Behavioral/Psych: Mood is stable, no new changes  Extremities: No lower extremity edema All other systems were reviewed with the patient and are negative.  I have reviewed the past medical history, past surgical history, social history and family history with the patient and they are unchanged from previous note.   PHYSICAL EXAMINATION: ECOG PERFORMANCE STATUS: 2 - Symptomatic, <50% confined to bed  Vitals:   02/08/21 0353 02/08/21 0448  BP: (!) 103/48 (!) 90/52  Pulse: 71 77  Resp:  20  Temp: 97.8 F (36.6 C) 98.1 F (36.7 C)  SpO2: 94% 94%   Filed Weights   02/03/21 1255  Weight: 103.9 kg    Intake/Output from previous day: 12/04 0701 - 12/05 0700 In: 720 [P.O.:720] Out: -   GENERAL:alert, no distress and comfortable SKIN: Petechiae noted over arms and legs which are fading, ecchymoses present EYES: normal, Conjunctiva are pink  and non-injected, sclera clear OROPHARYNX:no exudate, no erythema and lips, buccal mucosa, and tongue normal  LUNGS: clear to auscultation and percussion with normal breathing effort HEART: regular rate & rhythm and no murmurs and no lower extremity edema ABDOMEN:abdomen soft, non-tender and normal bowel sounds NEURO: alert & oriented x 3 with fluent speech, no focal motor/sensory deficits  LABORATORY DATA:  I have reviewed the data as listed CMP Latest Ref Rng & Units 02/08/2021 02/07/2021 02/06/2021  Glucose 70 - 99 mg/dL 105(H) 105(H) 111(H)  BUN 8 - 23 mg/dL 21 18 16   Creatinine 0.61 - 1.24 mg/dL 1.36(H) 1.35(H) 1.30(H)  Sodium 135 - 145 mmol/L 140 138 140  Potassium 3.5 - 5.1 mmol/L 4.3 3.9 3.2(L)  Chloride 98 - 111 mmol/L 105 105 106  CO2 22 - 32 mmol/L 31 28 28   Calcium 8.9 - 10.3 mg/dL 8.6(L) 8.3(L) 8.0(L)  Total Protein 6.5 - 8.1 g/dL 6.0(L) - 6.1(L)  Total Bilirubin 0.3 - 1.2 mg/dL 0.7 - 0.7  Alkaline Phos 38 - 126 U/L 67 - 67  AST 15 - 41 U/L 64(H) - 39  ALT 0 - 44 U/L 41 - 22    Lab Results  Component Value Date   WBC 3.6 (L) 02/08/2021   HGB 8.2 (L) 02/08/2021   HCT 25.3 (L) 02/08/2021   MCV 95.1 02/08/2021   PLT 53 (L) 02/08/2021   NEUTROABS 2.0 02/08/2021    CT ABDOMEN PELVIS WO CONTRAST  Result Date: 02/05/2021 CLINICAL DATA:  Lung cancer restaging EXAM: CT ABDOMEN AND PELVIS WITHOUT CONTRAST TECHNIQUE: Multidetector CT imaging of the abdomen  and pelvis was performed following the standard protocol without IV contrast. COMPARISON:  CT abdomen and pelvis 12/04/2020 FINDINGS: Hepatobiliary: Liver is enlarged measuring 18.8 cm in length. No suspicious hepatic mass visualized. Several stones identified in the dependent gallbladder. No gallbladder wall thickening or pericholecystic edema appreciated. Multiple calcific densities identified in the common bile duct consistent with choledocholithiasis, measuring up to 1 cm in size. The common bile duct measures approximately  12 mm in diameter. Similar to previous study. Pancreas: Atrophic with no suspicious mass or ductal dilatation identified. Spleen: Enlarged measuring 13.8 cm in length. Adrenals/Urinary Tract: Right adrenal gland is normal. Interval decreased size of the left adrenal gland mass now measuring 3.8 x 4.1 cm. No nephrolithiasis or hydronephrosis identified bilaterally. A few hypodense renal cortical cysts identified measuring up to 2.1 cm on the right and 3.1 cm on the left. Tiny calcific densities again seen in the region of the left renal pelvis which are nonspecific. Bilateral perinephric fat stranding which is mildly increased since previous study and nonspecific. Mild urinary bladder wall thickening. Stomach/Bowel: No bowel obstruction, free air or pneumatosis. Colonic diverticulosis. No bowel wall edema. Vascular/Lymphatic: Aortic atherosclerosis. No enlarged abdominal or pelvic lymph nodes. Reproductive: Prostate is unremarkable. Other: No ascites. Musculoskeletal: No suspicious bony lesions identified. IMPRESSION: 1. No new mass or lymphadenopathy identified in the abdomen or pelvis. 2. Interval decreased size of the left adrenal gland mass now measuring up to 4.1 cm. 3. Hepatosplenomegaly. 4. Cholelithiasis and choledocholithiasis. Correlate clinically for need for ERCP. 5. Other ancillary findings as described. Electronically Signed   By: Ofilia Neas M.D.   On: 02/05/2021 12:49   CT CHEST WO CONTRAST  Result Date: 02/05/2021 CLINICAL DATA:  Lung cancer, evaluate treatment response EXAM: CT CHEST WITHOUT CONTRAST TECHNIQUE: Multidetector CT imaging of the chest was performed following the standard protocol without IV contrast. COMPARISON:  CT chest 12/04/2020 FINDINGS: Cardiovascular: Heart size is normal. No pericardial effusion identified. Coronary artery calcifications noted. Main pulmonary artery is upper normal caliber. Thoracic aorta is normal caliber with moderate calcified plaques. Right-sided  central venous port. Mediastinum/Nodes: No axillary or mediastinal lymphadenopathy identified. Stable appearing enlarged lymph node in the right hilum measuring 1.9 cm. Lungs/Pleura: Hyperinflated lungs with mild emphysematous changes. Interval decreased size of an amorphous cavitary mass at the right lung base near the major fissure now measuring approximally 2.7 x 2.1 cm in axial dimensions. Redemonstration of several small pulmonary nodules which are stable in size and measure up to 6 mm in the right lower lobe near the major fissure, 9 mm pleural-based in the right middle lobe, and 5 mm in the left lower lobe. No new or significantly enlarged nodules identified. Trace bilateral pleural effusions right greater than left. No pneumothorax. Musculoskeletal: No suspicious bony lesions identified. IMPRESSION: 1. Evidence of treatment response. Decreased size of the dominant cavitary mass lesion at the right lung base since previous study. Stable additional smaller pulmonary nodules. 2. Stable appearing enlarged right hilar lymph node. 3. Other chronic findings as described. Aortic Atherosclerosis (ICD10-I70.0) and Emphysema (ICD10-J43.9). Electronically Signed   By: Ofilia Neas M.D.   On: 02/05/2021 13:03   IR IMAGING GUIDED PORT INSERTION  Result Date: 02/02/2021 CLINICAL DATA:  non-small cell lung carcinoma, needs durable venous access for planned treatment regimen EXAM: TUNNELED PORT CATHETER PLACEMENT WITH ULTRASOUND AND FLUOROSCOPIC GUIDANCE FLUOROSCOPY TIME:  seconds ANESTHESIA/SEDATION: Intravenous Fentanyl 138mg and Versed 277mwere administered as conscious sedation during continuous monitoring of the patient's level of consciousness and  physiological / cardiorespiratory status by the radiology RN, with a total moderate sedation time of 15 minutes. TECHNIQUE: The procedure, risks, benefits, and alternatives were explained to the patient. Questions regarding the procedure were encouraged and  answered. The patient understands and consents to the procedure. Patency of the right IJ vein was confirmed with ultrasound with image documentation. An appropriate skin site was determined. Skin site was marked. Region was prepped using maximum barrier technique including cap and mask, sterile gown, sterile gloves, large sterile sheet, and Chlorhexidine as cutaneous antisepsis. The region was infiltrated locally with 1% lidocaine. Under real-time ultrasound guidance, the right IJ vein was accessed with a 21 gauge micropuncture needle; the needle tip within the vein was confirmed with ultrasound image documentation. Needle was exchanged over a 018 guidewire for transitional dilator, and vascular measurement was performed. A small incision was made on the right anterior chest wall and a subcutaneous pocket fashioned. The power-injectable port was positioned and its catheter tunneled to the right IJ dermatotomy site. The transitional dilator was exchanged over an Amplatz wire for a peel-away sheath, through which the port catheter, which had been trimmed to the appropriate length, was advanced and positioned under fluoroscopy with its tip at the cavoatrial junction. Spot chest radiograph confirms good catheter position and no pneumothorax. The port was flushed per protocol. The pocket was closed with deep interrupted and subcuticular continuous 3-0 Monocryl sutures. The incisions were covered with Dermabond then covered with a sterile dressing. The patient tolerated the procedure well. COMPLICATIONS: COMPLICATIONS None immediate IMPRESSION: Technically successful right IJ power-injectable port catheter placement. Ready for routine use. Electronically Signed   By: Lucrezia Europe M.D.   On: 02/02/2021 15:41    ASSESSMENT AND PLAN: This is a very pleasant 65 year old Caucasian male diagnosed with a stage IV (T3, N2, M1 C) non-small cell lung cancer, likely adenocarcinoma based on the presence of KRAS G12C mutation  presented with large right lower lobe lung mass, enlarged with adjacent subpleural nodules in addition to right hilar and subcarinal lymphadenopathy and left adrenal gland metastasis diagnosed in July 2022.  Molecular studies showed positive KRAS G12C mutation, his histology is likely adenocarcinoma   The patient started initially on systemic chemotherapy with carboplatin for AUC of 5, paclitaxel 175 Mg/M2 and Keytruda 200 Mg IV every 3 weeks with Neulasta support based on the suspicious of histology of squamous cell carcinoma.  He has a rough time with this treatment with significant fatigue and weakness as well as myalgia and arthralgia from the Neulasta injection. Based on the recent finding on the molecular studies was positive for KRAS G12C mutation, his histology is likely adenocarcinoma and he was switched to carboplatin for AUC of 5, Alimta 500 Mg/M2 and Keytruda 200 Mg IV every 3 weeks. Starting from cycle #3, the patient's carboplatin was reduced to AUC of 4 and Alimta was reduced to 400 mg per metered square.  Carboplatin was discontinued after cycle #4.  He is now receiving maintenance Alimta with Keytruda and last dose was given on 01/20/2021.  The Alimta dose was reduced to 400 mg per metered squared.  Despite this, he has had persistent pancytopenia.  CBC from this morning has been reviewed.  WBC is slightly low at 3.6 but ANC is normal at 2.0.  He is afebrile.  His hemoglobin remained stable at 8.2 and platelets have improved to 53,000.  There is no need for PRBC or platelet transfusion today. He should receive PRBC transfusion for hemoglobin  less than 8 and platelet transfusion for platelet count less than 20,000 or active bleeding.  Restaging CT scan was performed 02/05/2021 which showed a treatment response.  He will follow-up with Dr. Julien Nordmann as previously scheduled to discuss results and make treatment decisions.  Per family request, will obtain his restaging CT scans prior to hospital  discharge as they were due on Monday 12/5.  I have sent a message to my office to cancel his previously scheduled CT scan for Monday.  He will keep his outpatient follow-up that is scheduled for 12/7 to review the results with Dr. Julien Nordmann.  He has outpatient follow-up already scheduled on 02/10/2021.  He was incidentally found to have cholelithiasis/choledocholithiasis on CT.  He reports mild abdominal discomfort which is intermittent but is otherwise asymptomatic.  His T bili is normal.  His AST is slightly elevated but he has had intermittent elevations in the AST previously.  ALT is normal.  He was seen by GI who suggested ERCP but they cannot perform this until platelet count is 100,000 or higher.  Management per GI.  From an oncology standpoint, the patient is okay to discharge.  Outpatient follow-up as already scheduled.  We will sign off.  Future Appointments  Date Time Provider [place]  02/10/2021  8:30 AM [facility] [place] None  02/10/2021  9:00 AM [name], MD [facility] None  02/10/2021 10:00 AM [facility] INFUSION [facility] None  02/17/2021 12:00 PM [facility] LAB [facility] None  02/24/2021 12:00 PM [facility] LAB [facility] None  03/03/2021 10:30 AM [facility] [place] FLUSH [facility] None  03/03/2021 11:00 AM Heilingoetter, Cassandra L, PA-C [facility] None  03/03/2021 11:45 AM [facility] INFUSION [facility] None  03/10/2021  9:30 AM [facility] [place] FLUSH [facility] None  03/17/2021  9:30 AM [facility] [place] FLUSH [facility] None  03/24/2021  8:00 AM [facility] [place] FLUSH [facility] None  03/24/2021  8:45 AM [name], MD [facility] None  03/24/2021  9:30 AM [facility] INFUSION [facility] None      LOS: 5 days   Mikey Bussing, DNP, AGPCNP-BC, AOCNP 02/08/21

## 2021-02-08 NOTE — Telephone Encounter (Signed)
Appt-Wife called and requested to cancel appt for 12/7. Per Cyril Mourning C.pt should keep his appt with [facility] on 12/7 .  ERCP-Dr Carlean Purl noted he is doing a ERCP on pt on 12/7.  Will leave pt on schedule with Dr. Julien Nordmann on 12/07

## 2021-02-08 NOTE — Care Management Important Message (Signed)
Important Message  Patient Details IM Letter given to the Patient. Name: [name] [name] MRN: [id number] Date of Birth: Oct 11, 1955   Medicare Important Message Given:  Yes     Kerin Salen 02/08/2021, 12:36 PM

## 2021-02-08 NOTE — Progress Notes (Signed)
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  He was advised to go to the ED for evaluation because he was also having blood in his stools and dizziness.  He received 2 units PRBCs and 2 units of platelets.  Hemoglobin improved to 9.1 and platelet count improved to 38,000.  Patient also found to have left foot cellulitis, started on IV antibiotics.    He had CT scans for staging on 02/05/2021 which showed cholelithiasis and choledocholithiasis.  Seen by GI with plan for ERCP on 02/10/2021.    02/08/21: Seen and examined at his bedside.  Reports right upper quadrant abdominal pain.  No nausea or vomiting.  AST uptrending.  Seen by GI with tentative ERCP on 02/10/2021  Assessment/Plan: Principal Problem:   Antineoplastic chemotherapy induced pancytopenia (HCC) Active Problems:   Essential hypertension   Chronic pain   Anxiety   Right lower lobe lung mass   Thrombocytopenia (HCC)   COPD (chronic obstructive pulmonary disease) (HCC)   Pancytopenia (HCC)   Pancytopenia due to antineoplastic chemotherapy (HCC)  Thrombocytopenia (HCC) post 2 units platelets transfusion. Chemotherapy-induced pancytopenia Presented with severe pancytopenia due to adverse reaction of a chemotherapeutic agent used to provide palliative chemotherapy for his stage IV (T3,N2,M1c) NSCLCA with left adrenal metastasis and now malignant thoracic lymphadenopathy.  He is receiving Alimta 500 mg/M2, Carboplatin, and Keytruda. It is thought that the cytopenia is due to the [place]. The  plan going forward will be to discontinue this agent. Sent to the hospital as a direct admit from cancer center due to report of <5 platelets on lab work. He received 2 units PRBCs transfusion on 01/27/2021 when his hemoglobin was 6.8.  He also received 2 pheresis packs of platelets.   Management per medical oncology  Incidentally found cholelithiasis/choledocholithiasis AST uptrending Right upper quadrant abdominal pain on 02/08/2021. Plan for ERCP on 02/10/2021.  Platelet count will need to be greater than 50,000.  Resolved refractory hypokalemia Serum potassium 4.5  Improving left foot cellulitis, POA MRSA screening negative Blood cultures negative to date Received Rocephin and IV vancomycin Keflex started on 02/05/2021.  AKI, suspect prerenal in the setting of dehydration. Presented with creatinine of 2.21 Creatinine 1.35 from 1.30 from 1.49 from 1.7 Continue to avoid nephrotoxic agents, dehydration and hypotension. Continue IV fluid hydration LR 75 cc/h x 2 days, continue.  Resolved post repletion: Hypomagnesemia Serum magnesium 2.0 from 1.1.  Right lower lobe lung mass NSCLCA: stage IV (T3,N2,M1c) with malignant lymphadenopathy   Chronic anxiety/depression Continue home regimen  Resolved elevated liver chemistries AST and T bilirubin have normalized. Monitor and repeat CMP in the morning   COPD (chronic obstructive pulmonary disease) (HCC) Stable. Albuterol nebs will be made available on a prn basis.   Chronic pain Cancer related pain. The patient will be continued on his home pain regimen. He will also have a bowel regimen ordered.    CODE STATUS: Full Code DVT Prophylaxis: None due to severe thrombocytopenia Family Communication: Sister at bedside. From: Home Disposition: Home      Code Status: Full code  Family Communication:  Sister at bedside.  Disposition Plan: Will likely discharge to home   Consultants: Medical  oncology  Procedures: None  Antimicrobials: Rocephin IV vancomycin Keflex started on 02/06/2021.  DVT prophylaxis: SCDs  Status is: Inpatient  Inpatient status.  Patient requires at least 2 midnights for further evaluation and treatment of present condition.      Objective: Vitals:   02/07/21 1943 02/08/21 0353 02/08/21 0448 02/08/21 1209  BP: 102/61 (!) 103/48 (!) 90/52 (!) 117/59  Pulse: 74 71 77 82  Resp: 18  20 20   Temp: 98.2 F (36.8 C) 97.8 F (36.6 C) 98.1 F (36.7 C) 97.8 F (36.6 C)  TempSrc: Oral Oral Oral Oral  SpO2: 100% 94% 94% 98%  Weight:      Height:        Intake/Output Summary (Last 24 hours) at 02/08/2021 1649 Last data filed at 02/08/2021 1209 Gross per 24 hour  Intake 480 ml  Output --  Net 480 ml   Filed Weights   02/03/21 1255  Weight: 103.9 kg    Exam:  General: 65 y.o. year-old male well-developed well-nourished no acute distress.  She is alert and oriented x3. Cardiovascular: Regular rate and rhythm no rubs or gallops.  No JVD or thyromegaly noted.   Respiratory: Clear to auscultation with no wheezes or rales.   Abdomen: Soft nontender normal bowel sounds present.  Musculoskeletal: No lower extremity edema bilaterally.   Skin: Left foot dorsal cellulitis is improving  psychiatry: Mood is appropriate for condition and setting.  Data Reviewed: CBC: Recent Labs  Lab 02/04/21 0743 02/05/21 0550 02/06/21 0302 02/07/21 0310 02/08/21 0309 02/08/21 1528  WBC 5.2 12.2* 15.0* 9.5 3.6* 3.9*  NEUTROABS 4.1 10.4* 12.6* 7.1 2.0  --   HGB 9.1* 9.2* 8.5* 8.7* 8.2* 8.9*  HCT 26.3* 26.6* 25.7* 26.9* 25.3* 27.7*  MCV 88.6 89.3 93.1 94.1 95.1 94.2  PLT 12* 31* 38* 46* 53* 65*   Basic Metabolic Panel: Recent Labs  Lab 02/05/21 0550 02/06/21 0302 02/07/21 0310 02/08/21 0309 02/08/21 1528  NA 137 140 138 140 141  K 2.6* 3.2* 3.9 4.3 4.5  CL 101 106 105 105 100  CO2 25 28 28 31 29   GLUCOSE 102* 111* 105* 105* 102*  BUN 22 16 18  21 19   CREATININE 1.49* 1.30* 1.35* 1.36* 1.35*  CALCIUM 7.9* 8.0* 8.3* 8.6* 9.2  MG 1.1* 2.0 1.7 1.7  --   PHOS 2.5  --   --  3.8  --    GFR: Estimated Creatinine Clearance: 63.7 mL/min (A) (by C-G formula based on SCr of 1.35 mg/dL (H)). Liver Function Tests: Recent Labs  Lab 02/04/21 0334 02/05/21 0550 02/06/21 0255 02/08/21 0309 02/08/21 1528  AST 49* 42* 39 64* 66*  ALT 23 21 22  41 43  ALKPHOS 52 58 67 67 70  BILITOT 1.9* 0.8 0.7 0.7 0.5  PROT 6.7 6.2* 6.1* 6.0* 6.5  ALBUMIN 3.0* 2.9* 2.9* 2.9* 3.2*   No results for input(s): LIPASE, AMYLASE in the last 168 hours. No results for input(s): AMMONIA in the last 168 hours. Coagulation Profile: Recent Labs  Lab 02/04/21 0334  INR 1.1   Cardiac Enzymes: No results for input(s): CKTOTAL, CKMB, CKMBINDEX, TROPONINI in the last 168 hours. BNP (last 3 results) No results for input(s): PROBNP in the last 8760 hours. HbA1C: No results for input(s): HGBA1C in the last 72 hours. CBG: No results for input(s): GLUCAP in the last 168 hours. Lipid Profile: No results for input(s):  CHOL, HDL, LDLCALC, TRIG, CHOLHDL, LDLDIRECT in the last 72 hours. Thyroid Function Tests: No results for input(s): TSH, T4TOTAL, FREET4, T3FREE, THYROIDAB in the last 72 hours. Anemia Panel: No results for input(s): VITAMINB12, FOLATE, FERRITIN, TIBC, IRON, RETICCTPCT in the last 72 hours. Urine analysis:    Component Value Date/Time   COLORURINE YELLOW 11/18/2020 0930   APPEARANCEUR CLOUDY (A) 11/18/2020 0930   LABSPEC 1.030 11/18/2020 0930   PHURINE 6.0 11/18/2020 0930   GLUCOSEU NEGATIVE 11/18/2020 0930   HGBUR NEGATIVE 11/18/2020 0930   BILIRUBINUR NEGATIVE 11/18/2020 0930   KETONESUR NEGATIVE 11/18/2020 0930   PROTEINUR NEGATIVE 11/18/2020 0930   NITRITE NEGATIVE 11/18/2020 0930   LEUKOCYTESUR NEGATIVE 11/18/2020 0930   Sepsis Labs: @LABRCNTIP (procalcitonin:4,lacticidven:4)  ) Recent Results (from the past 240 hour(s))  Culture,  blood (x 2)     Status: None (Preliminary result)   Collection Time: 02/03/21  9:06 PM   Specimen: BLOOD  Result Value Ref Range Status   Specimen Description   Final    BLOOD BLOOD RIGHT HAND Performed at [facility], [place] [address], [place], [place] [address]    Special Requests   Final    BOTTLES DRAWN AEROBIC AND ANAEROBIC Blood Culture adequate volume Performed at [facility] [address], [place], [place] [address]    Culture   Final    NO GROWTH 4 DAYS Performed at [facility], [place] [address], [place], [place] [address]    Report Status PENDING  Incomplete  Culture, blood (x 2)     Status: None (Preliminary result)   Collection Time: 02/03/21  9:06 PM   Specimen: BLOOD  Result Value Ref Range Status   Specimen Description   Final    BLOOD BLOOD LEFT FOREARM Performed at [facility] [address], [place], [place] [address]    Special Requests   Final    BOTTLES DRAWN AEROBIC AND ANAEROBIC Blood Culture adequate volume Performed at [facility] [address], [place], [place] [address]    Culture   Final    NO GROWTH 4 DAYS Performed at [facility], [place] [address], [place], [place] [address]    Report Status PENDING  Incomplete  Resp Panel by RT-PCR (Flu A&B, Covid) Nasopharyngeal Swab     Status: None   Collection Time: 02/03/21 11:21 PM   Specimen: Nasopharyngeal Swab; Nasopharyngeal(NP) swabs in vial transport medium  Result Value Ref Range Status   SARS Coronavirus 2 by RT PCR NEGATIVE NEGATIVE Final    Comment: (NOTE) SARS-CoV-2 target nucleic acids are NOT DETECTED.  The SARS-CoV-2 RNA is generally detectable in upper respiratory specimens during the acute phase of infection. The lowest concentration of SARS-CoV-2 viral copies this assay can detect is 138 copies/mL. A negative result does not preclude SARS-Cov-2 infection and should not be used as the sole  basis for treatment or other patient management decisions. A negative result may occur with  improper specimen collection/handling, submission of specimen other than nasopharyngeal swab, presence of viral mutation(s) within the areas targeted by this assay, and inadequate number of viral copies(<138 copies/mL). A negative result must be combined with clinical observations, patient history, and epidemiological information. The expected result is Negative.  Fact Sheet for Patients:  EntrepreneurPulse.com.au  Fact Sheet for Healthcare Providers:  IncredibleEmployment.be  This test is no t yet approved or cleared by the [place] FDA and  has been authorized for detection and/or diagnosis of  SARS-CoV-2 by FDA under an Emergency Use Authorization (EUA). This EUA will remain  in effect (meaning this test can be used) for the duration of the COVID-19 declaration under Section 564(b)(1) of the Act, 21 U.S.C.section 360bbb-3(b)(1), unless the authorization is terminated  or revoked sooner.       Influenza A by PCR NEGATIVE NEGATIVE Final   Influenza B by PCR NEGATIVE NEGATIVE Final    Comment: (NOTE) The Xpert Xpress SARS-CoV-2/FLU/RSV plus assay is intended as an aid in the diagnosis of influenza from Nasopharyngeal swab specimens and should not be used as a sole basis for treatment. Nasal washings and aspirates are unacceptable for Xpert Xpress SARS-CoV-2/FLU/RSV testing.  Fact Sheet for Patients: EntrepreneurPulse.com.au  Fact Sheet for Healthcare Providers: IncredibleEmployment.be  This test is not yet approved or cleared by the [place] FDA and has been authorized for detection and/or diagnosis of SARS-CoV-2 by FDA under an Emergency Use Authorization (EUA). This EUA will remain in effect (meaning this test can be used) for the duration of the COVID-19 declaration under Section 564(b)(1) of the Act,  21 U.S.C. section 360bbb-3(b)(1), unless the authorization is terminated or revoked.  Performed at [facility], [place] [address], [place], [place] [address]   MRSA Next Gen by PCR, Nasal     Status: None   Collection Time: 02/04/21  4:37 PM   Specimen: Nasal Mucosa; Nasal Swab  Result Value Ref Range Status   MRSA by PCR Next Gen NOT DETECTED NOT DETECTED Final    Comment: (NOTE) The GeneXpert MRSA Assay (FDA approved for NASAL specimens only), is one component of a comprehensive MRSA colonization surveillance program. It is not intended to diagnose MRSA infection nor to guide or monitor treatment for MRSA infections. Test performance is not FDA approved in patients less than 36 years old. Performed at [facility], [place] [address], [place], [place] [address]   Gastrointestinal Panel by PCR , Stool     Status: None   Collection Time: 02/05/21  3:39 PM   Specimen: Stool  Result Value Ref Range Status   Campylobacter species NOT DETECTED NOT DETECTED Final   Plesimonas shigelloides NOT DETECTED NOT DETECTED Final   Salmonella species NOT DETECTED NOT DETECTED Final   Yersinia enterocolitica NOT DETECTED NOT DETECTED Final   Vibrio species NOT DETECTED NOT DETECTED Final   Vibrio cholerae NOT DETECTED NOT DETECTED Final   Enteroaggregative E coli (EAEC) NOT DETECTED NOT DETECTED Final   Enteropathogenic E coli (EPEC) NOT DETECTED NOT DETECTED Final   Enterotoxigenic E coli (ETEC) NOT DETECTED NOT DETECTED Final   Shiga like toxin producing E coli (STEC) NOT DETECTED NOT DETECTED Final   Shigella/Enteroinvasive E coli (EIEC) NOT DETECTED NOT DETECTED Final   Cryptosporidium NOT DETECTED NOT DETECTED Final   Cyclospora cayetanensis NOT DETECTED NOT DETECTED Final   Entamoeba histolytica NOT DETECTED NOT DETECTED Final   Giardia lamblia NOT DETECTED NOT DETECTED Final   Adenovirus F40/41 NOT DETECTED NOT DETECTED Final   Astrovirus NOT DETECTED  NOT DETECTED Final   Norovirus GI/GII NOT DETECTED NOT DETECTED Final   Rotavirus A NOT DETECTED NOT DETECTED Final   Sapovirus (I, II, IV, and V) NOT DETECTED NOT DETECTED Final    Comment: Performed at [facility], [address], [place], [place] [address]  C Difficile Quick Screen w PCR reflex     Status: None   Collection Time: 02/05/21  3:39 PM  Result Value Ref Range Status  C Diff antigen NEGATIVE NEGATIVE Final   C Diff toxin NEGATIVE NEGATIVE Final   C Diff interpretation No C. difficile detected.  Final    Comment: Performed at [facility], [place] [address], [place], [place] [address]      Studies: No results found.  Scheduled Meds:  allopurinol  200 mg Oral Daily   cephALEXin  500 mg Oral Q6H   Chlorhexidine Gluconate Cloth  6 each Topical Daily   feeding supplement  237 mL Oral BID BM   folic acid  1 mg Oral Daily   metoprolol tartrate  25 mg Oral BID   morphine  60 mg Oral Q12H   multivitamin with minerals  1 tablet Oral Daily   potassium chloride SA  40 mEq Oral Daily   rosuvastatin  20 mg Oral Daily    Continuous Infusions:     LOS: 5 days     Kayleen Memos, MD [facility] Pager [phone]  If 7PM-7AM, please contact night-coverage [email or web address] Password [facility] 02/08/2021, 4:49 PM

## 2021-02-08 NOTE — Progress Notes (Addendum)
    Progress Note   Subjective  Chief Complaint: Choledocholithiasis, stage IV non-small cell lung cancer  This morning, patient tells me he is feeling fairly well.  Does discuss a possible "boil" which seemed to open up on his buttocks the other night and was painful and "bad smelling".  Continues with some right-sided abdominal pain.  No nausea or vomiting.   Objective   Vital signs in last 24 hours: Temp:  [97.8 F (36.6 C)-98.2 F (36.8 C)] 98.1 F (36.7 C) (12/05 0448) Pulse Rate:  [71-81] 77 (12/05 0448) Resp:  [18-20] 20 (12/05 0448) BP: (90-103)/(48-61) 90/52 (12/05 0448) SpO2:  [94 %-100 %] 94 % (12/05 0448) Last BM Date: 02/07/21 General:    white male in NAD Heart:  Regular rate and rhythm; no murmurs Lungs: Respirations even and unlabored, lungs CTA bilaterally Abdomen:  Soft, moderate right upper quadrant TTP and nondistended. Normal bowel sounds. Psych:  Cooperative. Normal mood and affect.    Lab Results: Recent Labs    02/06/21 0302 02/07/21 0310 02/08/21 0309  WBC 15.0* 9.5 3.6*  HGB 8.5* 8.7* 8.2*  HCT 25.7* 26.9* 25.3*  PLT 38* 46* 53*   BMET Recent Labs    02/06/21 0302 02/07/21 0310 02/08/21 0309  NA 140 138 140  K 3.2* 3.9 4.3  CL 106 105 105  CO2 28 28 31   GLUCOSE 111* 105* 105*  BUN 16 18 21   CREATININE 1.30* 1.35* 1.36*  CALCIUM 8.0* 8.3* 8.6*   LFT Recent Labs    02/06/21 0255 02/08/21 0309  PROT 6.1* 6.0*  ALBUMIN 2.9* 2.9*  AST 39 64*  ALT 22 41  ALKPHOS 67 67  BILITOT 0.7 0.7  BILIDIR 0.2  --   IBILI 0.5  --      Assessment / Plan:   Assessment: 1.  Choledocholithiasis: Patient does have some right upper quadrant pain on exam but no nausea/vomiting, LFTs are normal, plans for ERCP as long as platelets remain improved 2.  Stage IV non-small cell lung cancer: With pancytopenia status post chemo  Plan: 1.  Patient scheduled for an ERCP on 02/10/2021 with Dr. Carlean Purl.  Did discuss risks, benefits, limitations and  alternatives and the patient agrees to proceed. 2.  Continue to monitor CBC.  Did discuss with the patient and his sister who was at his bedside today that his platelets need to remain elevated/improving in order for [place] to proceed.  They verbalized understanding. 3.  Patient will be n.p.o. at midnight on 02/09/2021 until then he can continue his current diet 4.  Continue to monitor LFTs  Thank you for your kind consultation.  We will continue to follow.   LOS: 5 days   Levin Erp  02/08/2021, 10:30 AM    [facility] Attending   I have taken an interval history, reviewed the chart and examined the patient. I agree with the Advanced Practitioner's note, impression and recommendations.  Majority the medical decision-making in the formulation of the assessment and plan were performed by me.  He is slightly tender in RUQ area so maybe he is symptomatic. Nevertheless the CBD stones merit removal to prevent significant illness and problems.  Aiming for Wed 12/7  I do not think PLTs need to be 100K but do want to see them > 50 K and climbing  Gatha Mayer, MD, [facility] 02/08/2021 3:36 PM

## 2021-02-09 ENCOUNTER — Other Ambulatory Visit: Payer: Self-pay | Admitting: Cardiology

## 2021-02-09 DIAGNOSIS — K8050 Calculus of bile duct without cholangitis or cholecystitis without obstruction: Secondary | ICD-10-CM

## 2021-02-09 DIAGNOSIS — T451X5A Adverse effect of antineoplastic and immunosuppressive drugs, initial encounter: Secondary | ICD-10-CM | POA: Diagnosis not present

## 2021-02-09 DIAGNOSIS — D61810 Antineoplastic chemotherapy induced pancytopenia: Secondary | ICD-10-CM | POA: Diagnosis not present

## 2021-02-09 LAB — CULTURE, BLOOD (ROUTINE X 2)
Culture: NO GROWTH
Culture: NO GROWTH
Special Requests: ADEQUATE
Special Requests: ADEQUATE

## 2021-02-09 LAB — CBC
HCT: 27.4 % — ABNORMAL LOW (ref 39.0–52.0)
Hemoglobin: 8.8 g/dL — ABNORMAL LOW (ref 13.0–17.0)
MCH: 30.4 pg (ref 26.0–34.0)
MCHC: 32.1 g/dL (ref 30.0–36.0)
MCV: 94.8 fL (ref 80.0–100.0)
Platelets: 78 10*3/uL — ABNORMAL LOW (ref 150–400)
RBC: 2.89 MIL/uL — ABNORMAL LOW (ref 4.22–5.81)
RDW: 15.6 % — ABNORMAL HIGH (ref 11.5–15.5)
WBC: 3.4 10*3/uL — ABNORMAL LOW (ref 4.0–10.5)
nRBC: 0 % (ref 0.0–0.2)

## 2021-02-09 LAB — COMPREHENSIVE METABOLIC PANEL
ALT: 47 U/L — ABNORMAL HIGH (ref 0–44)
AST: 63 U/L — ABNORMAL HIGH (ref 15–41)
Albumin: 3 g/dL — ABNORMAL LOW (ref 3.5–5.0)
Alkaline Phosphatase: 68 U/L (ref 38–126)
Anion gap: 7 (ref 5–15)
BUN: 21 mg/dL (ref 8–23)
CO2: 30 mmol/L (ref 22–32)
Calcium: 9.2 mg/dL (ref 8.9–10.3)
Chloride: 103 mmol/L (ref 98–111)
Creatinine, Ser: 1.24 mg/dL (ref 0.61–1.24)
GFR, Estimated: >60 mL/min (ref >60)
Glucose, Bld: 103 mg/dL — ABNORMAL HIGH (ref 70–99)
Potassium: 4.4 mmol/L (ref 3.5–5.1)
Sodium: 140 mmol/L (ref 135–145)
Total Bilirubin: 0.6 mg/dL (ref 0.3–1.2)
Total Protein: 6.3 g/dL — ABNORMAL LOW (ref 6.5–8.1)

## 2021-02-09 MED ORDER — CEPHALEXIN 500 MG PO CAPS
500.0000 mg | ORAL_CAPSULE | Freq: Four times a day (QID) | ORAL | Status: DC
  Administered 2021-02-09 – 2021-02-12 (×11): 500 mg via ORAL
  Filled 2021-02-09 (×11): qty 1

## 2021-02-09 NOTE — Progress Notes (Signed)
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  He was advised to go to the ED for further evaluation and management.  He received 2 units PRBCs for hemoglobin of 6.8K and 2 units of platelets for platelet count less than 5000.  Hemoglobin improved to 9.1 and platelet count improved to 38,000.  Patient was also found to have left foot cellulitis, POA, for which he was started on IV antibiotics.    He had CT scans for staging CT CAP with contrast on 02/05/2021 which showed incidental findings of cholelithiasis and choledocholithiasis.  Seen by GI with plan for ERCP on 02/10/2021.  If platelets remain stable above 50,000.  N.p.o. after midnight.  02/09/21: Seen at bedside.  Reports right upper quadrant abdominal pain.  No nausea or vomiting.  LFTs are uptrending.  Assessment/Plan: Principal Problem:   Antineoplastic chemotherapy induced pancytopenia (HCC) Active Problems:   Essential hypertension   Chronic pain   Anxiety   Right lower lobe lung mass   Thrombocytopenia (HCC)   COPD (chronic obstructive pulmonary disease) (HCC)   Pancytopenia (HCC)   Pancytopenia due to antineoplastic chemotherapy ([place])   Choledocholithiasis  Severe thrombocytopenia (HCC) post 2 units platelets transfusion. Chemotherapy-induced pancytopenia Presented with severe thrombocytopenia with platelet count less than 5000 due to adverse reaction of a chemotherapeutic agent used to provide palliative chemotherapy for his stage IV  (T3,N2,M1c) NSCLCA with left adrenal metastasis and now malignant thoracic lymphadenopathy.   Received Alimta 500 mg/M2, Carboplatin, and Keytruda.  It is thought that the cytopenia is due to the [place]. The plan going forward will be to discontinue this agent.  Sent to the hospital as a direct admit from cancer center due to report of <5 platelets on lab work. He received 2 units PRBCs transfusion on 01/27/2021 for hemoglobin of 6.8K.  He also received 2 pheresis packs of platelets.   Management per medical oncology  Acute transaminitis/elevated liver chemistries suspect secondary to choledocholithiasis AST, ALT uptrending. T bilirubin 0.6. Repeat CMP in the morning Plan for ERCP on 02/10/2021.  Incidentally found cholelithiasis/choledocholithiasis LFTs are uptrending. Right upper quadrant abdominal pain on 02/08/2021. Plan for ERCP on 02/10/2021.   Platelet count will need to be greater than 50,000. N.p.o. after midnight  Resolved refractory hypokalemia Serum potassium 4.5  Improving left foot cellulitis, POA MRSA screening negative Blood cultures negative to date Received Rocephin and IV vancomycin Keflex started on 02/05/2021> 02/12/2021.  Resolved AKI, suspect prerenal in the setting of dehydration. Baseline creatinine appears to be 1.0 with GFR greater than 60. Presented with creatinine of 2.21 Creatinine 1.24 with GFR currently 60 Continue to avoid nephrotoxic agents, dehydration and hypotension. Received IV fluid hydration LR 75 cc/h x 2 days.  Resolved post repletion: Hypomagnesemia  Right lower lobe lung mass NSCLCA: stage IV (T3,N2,M1c) with malignant lymphadenopathy   Chronic anxiety/depression Continue home regimen  Resolved elevated liver chemistries AST and T bilirubin have normalized. Monitor and repeat CMP in the morning   COPD (chronic obstructive pulmonary disease) (HCC) Stable. Albuterol nebs will be  made available on a prn basis.   Chronic  pain Cancer related pain.  Continue pain management Continue bowel regimen.    CODE STATUS: Full Code DVT Prophylaxis: None due to severe thrombocytopenia and symptomatic anemia requiring blood transfusion. Family Communication: Updated sister via phone. From: Home Disposition: Home    Disposition Plan: Will likely discharge to home after ERCP possibly on 02/11/2021.   Consultants: Medical oncology GI  Procedures: Plan ERCP on 02/11/2019.  Antimicrobials: Rocephin IV vancomycin Keflex started on 02/05/2021.   Status is: Inpatient  Inpatient status.  Patient requires at least 2 midnights for further evaluation and treatment of present condition.      Objective: Vitals:   02/08/21 0448 02/08/21 1209 02/08/21 2037 02/09/21 0421  BP: (!) 90/52 (!) 117/59 (!) 154/66 97/65  Pulse: 77 82 64 81  Resp: 20 20  14   Temp: 98.1 F (36.7 C) 97.8 F (36.6 C)  98.7 F (37.1 C)  TempSrc: Oral Oral  Oral  SpO2: 94% 98%  92%  Weight:      Height:        Intake/Output Summary (Last 24 hours) at 02/09/2021 1325 Last data filed at 02/08/2021 1700 Gross per 24 hour  Intake 240 ml  Output --  Net 240 ml   Filed Weights   02/03/21 1255  Weight: 103.9 kg    Exam:  General: 65 y.o. year-old male well-developed well-nourished in no acute distress.  He is alert and oriented x3. Cardiovascular: Regular rate and rhythm no rubs or gallops.  No JVD or thyromegaly noted.   Respiratory: Clear to auscultation with no wheezes or rales. Abdomen: Soft right upper quadrant tenderness with palpation.  Bowel sounds present.  Musculoskeletal: No lower extremity edema bilaterally.   Skin: Left foot dorsal cellulitis is improving. psychiatry: Mood is appropriate for condition and setting.  Data Reviewed: CBC: Recent Labs  Lab 02/04/21 0743 02/05/21 0550 02/06/21 0302 02/07/21 0310 02/08/21 0309 02/08/21 1528 02/09/21 0532  WBC 5.2 12.2* 15.0* 9.5 3.6* 3.9* 3.4*  NEUTROABS 4.1  10.4* 12.6* 7.1 2.0  --   --   HGB 9.1* 9.2* 8.5* 8.7* 8.2* 8.9* 8.8*  HCT 26.3* 26.6* 25.7* 26.9* 25.3* 27.7* 27.4*  MCV 88.6 89.3 93.1 94.1 95.1 94.2 94.8  PLT 12* 31* 38* 46* 53* 65* 78*   Basic Metabolic Panel: Recent Labs  Lab 02/05/21 0550 02/06/21 0302 02/07/21 0310 02/08/21 0309 02/08/21 1528 02/09/21 0532  NA 137 140 138 140 141 140  K 2.6* 3.2* 3.9 4.3 4.5 4.4  CL 101 106 105 105 100 103  CO2 25 28 28 31 29 30   GLUCOSE 102* 111* 105* 105* 102* 103*  BUN 22 16 18 21 19 21   CREATININE 1.49* 1.30* 1.35* 1.36* 1.35* 1.24  CALCIUM 7.9* 8.0* 8.3* 8.6* 9.2 9.2  MG 1.1* 2.0 1.7 1.7  --   --   PHOS 2.5  --   --  3.8  --   --    GFR: Estimated Creatinine Clearance: 69.4 mL/min (by C-G formula based on SCr of 1.24 mg/dL). Liver Function Tests: Recent Labs  Lab 02/05/21 0550 02/06/21 0255 02/08/21 0309 02/08/21 1528 02/09/21 0532  AST 42* 39 64* 66* 63*  ALT 21 22 41 43 47*  ALKPHOS 58 67 67 70 68  BILITOT 0.8 0.7 0.7 0.5 0.6  PROT 6.2* 6.1* 6.0* 6.5 6.3*  ALBUMIN 2.9* 2.9* 2.9* 3.2* 3.0*   No results for input(s): LIPASE, AMYLASE in the last 168 hours. No  results for input(s): AMMONIA in the last 168 hours. Coagulation Profile: Recent Labs  Lab 02/04/21 0334  INR 1.1   Cardiac Enzymes: No results for input(s): CKTOTAL, CKMB, CKMBINDEX, TROPONINI in the last 168 hours. BNP (last 3 results) No results for input(s): PROBNP in the last 8760 hours. HbA1C: No results for input(s): HGBA1C in the last 72 hours. CBG: No results for input(s): GLUCAP in the last 168 hours. Lipid Profile: No results for input(s): CHOL, HDL, LDLCALC, TRIG, CHOLHDL, LDLDIRECT in the last 72 hours. Thyroid Function Tests: No results for input(s): TSH, T4TOTAL, FREET4, T3FREE, THYROIDAB in the last 72 hours. Anemia Panel: No results for input(s): VITAMINB12, FOLATE, FERRITIN, TIBC, IRON, RETICCTPCT in the last 72 hours. Urine analysis:    Component Value Date/Time   COLORURINE  YELLOW 11/18/2020 0930   APPEARANCEUR CLOUDY (A) 11/18/2020 0930   LABSPEC 1.030 11/18/2020 0930   PHURINE 6.0 11/18/2020 0930   GLUCOSEU NEGATIVE 11/18/2020 0930   HGBUR NEGATIVE 11/18/2020 0930   BILIRUBINUR NEGATIVE 11/18/2020 0930   KETONESUR NEGATIVE 11/18/2020 0930   PROTEINUR NEGATIVE 11/18/2020 0930   NITRITE NEGATIVE 11/18/2020 0930   LEUKOCYTESUR NEGATIVE 11/18/2020 0930   Sepsis Labs: @LABRCNTIP (procalcitonin:4,lacticidven:4)  ) Recent Results (from the past 240 hour(s))  Culture, blood (x 2)     Status: None   Collection Time: 02/03/21  9:06 PM   Specimen: BLOOD  Result Value Ref Range Status   Specimen Description   Final    BLOOD BLOOD RIGHT HAND Performed at [facility], [place] [address] [place], [place] [address]    Special Requests   Final    BOTTLES DRAWN AEROBIC AND ANAEROBIC Blood Culture adequate volume Performed at [place] [address] [place], [place] [address]    Culture   Final    NO GROWTH 5 DAYS Performed at [facility], [place] [address], [place], [place] [address]    Report Status 02/09/2021 FINAL  Final  Culture, blood (x 2)     Status: None   Collection Time: 02/03/21  9:06 PM   Specimen: BLOOD  Result Value Ref Range Status   Specimen Description   Final    BLOOD BLOOD LEFT FOREARM Performed at [place] [address] [place], [place] [address]    Special Requests   Final    BOTTLES DRAWN AEROBIC AND ANAEROBIC Blood Culture adequate volume Performed at [place] [address] [place], [place] [address]    Culture   Final    NO GROWTH 5 DAYS Performed at [facility], [place] [address] [place], [place] [address]    Report Status 02/09/2021 FINAL  Final  Resp Panel by RT-PCR (Flu A&B, Covid) Nasopharyngeal Swab     Status: None   Collection Time: 02/03/21 11:21 PM   Specimen: Nasopharyngeal Swab; Nasopharyngeal(NP) swabs in vial  transport medium  Result Value Ref Range Status   SARS Coronavirus 2 by RT PCR NEGATIVE NEGATIVE Final    Comment: (NOTE) SARS-CoV-2 target nucleic acids are NOT DETECTED.  The SARS-CoV-2 RNA is generally detectable in upper respiratory specimens during the acute phase of infection. The lowest concentration of SARS-CoV-2 viral copies this assay can detect is 138 copies/mL. A negative result does not preclude SARS-Cov-2 infection and should not be used as the sole basis for treatment or other patient management decisions. A negative result may occur with  improper specimen collection/handling, submission of specimen other than nasopharyngeal swab,  presence of viral mutation(s) within the areas targeted by this assay, and inadequate number of viral copies(<138 copies/mL). A negative result must be combined with clinical observations, patient history, and epidemiological information. The expected result is Negative.  Fact Sheet for Patients:  EntrepreneurPulse.com.au  Fact Sheet for Healthcare Providers:  IncredibleEmployment.be  This test is no t yet approved or cleared by the [place] FDA and  has been authorized for detection and/or diagnosis of SARS-CoV-2 by FDA under an Emergency Use Authorization (EUA). This EUA will remain  in effect (meaning this test can be used) for the duration of the COVID-19 declaration under Section 564(b)(1) of the Act, 21 U.S.C.section 360bbb-3(b)(1), unless the authorization is terminated  or revoked sooner.       Influenza A by PCR NEGATIVE NEGATIVE Final   Influenza B by PCR NEGATIVE NEGATIVE Final    Comment: (NOTE) The Xpert Xpress SARS-CoV-2/FLU/RSV plus assay is intended as an aid in the diagnosis of influenza from Nasopharyngeal swab specimens and should not be used as a sole basis for treatment. Nasal washings and aspirates are unacceptable for Xpert Xpress SARS-CoV-2/FLU/RSV testing.  Fact  Sheet for Patients: EntrepreneurPulse.com.au  Fact Sheet for Healthcare Providers: IncredibleEmployment.be  This test is not yet approved or cleared by the [place] FDA and has been authorized for detection and/or diagnosis of SARS-CoV-2 by FDA under an Emergency Use Authorization (EUA). This EUA will remain in effect (meaning this test can be used) for the duration of the COVID-19 declaration under Section 564(b)(1) of the Act, 21 U.S.C. section 360bbb-3(b)(1), unless the authorization is terminated or revoked.  Performed at [facility], [place] [address] [place], [place] [address]   MRSA Next Gen by PCR, Nasal     Status: None   Collection Time: 02/04/21  4:37 PM   Specimen: Nasal Mucosa; Nasal Swab  Result Value Ref Range Status   MRSA by PCR Next Gen NOT DETECTED NOT DETECTED Final    Comment: (NOTE) The GeneXpert MRSA Assay (FDA approved for NASAL specimens only), is one component of a comprehensive MRSA colonization surveillance program. It is not intended to diagnose MRSA infection nor to guide or monitor treatment for MRSA infections. Test performance is not FDA approved in patients less than 17 years old. Performed at [facility], [place] [address], [place], [place] [address]   Gastrointestinal Panel by PCR , Stool     Status: None   Collection Time: 02/05/21  3:39 PM   Specimen: Stool  Result Value Ref Range Status   Campylobacter species NOT DETECTED NOT DETECTED Final   Plesimonas shigelloides NOT DETECTED NOT DETECTED Final   Salmonella species NOT DETECTED NOT DETECTED Final   Yersinia enterocolitica NOT DETECTED NOT DETECTED Final   Vibrio species NOT DETECTED NOT DETECTED Final   Vibrio cholerae NOT DETECTED NOT DETECTED Final   Enteroaggregative E coli (EAEC) NOT DETECTED NOT DETECTED Final   Enteropathogenic E coli (EPEC) NOT DETECTED NOT DETECTED Final   Enterotoxigenic E coli  (ETEC) NOT DETECTED NOT DETECTED Final   Shiga like toxin producing E coli (STEC) NOT DETECTED NOT DETECTED Final   Shigella/Enteroinvasive E coli (EIEC) NOT DETECTED NOT DETECTED Final   Cryptosporidium NOT DETECTED NOT DETECTED Final   Cyclospora cayetanensis NOT DETECTED NOT DETECTED Final   Entamoeba histolytica NOT DETECTED NOT DETECTED Final   Giardia lamblia NOT DETECTED NOT DETECTED Final   Adenovirus F40/41 NOT DETECTED NOT DETECTED Final   Astrovirus NOT DETECTED NOT DETECTED  Final   Norovirus GI/GII NOT DETECTED NOT DETECTED Final   Rotavirus A NOT DETECTED NOT DETECTED Final   Sapovirus (I, II, IV, and V) NOT DETECTED NOT DETECTED Final    Comment: Performed at [facility], [place], [place] [address]  C Difficile Quick Screen w PCR reflex     Status: None   Collection Time: 02/05/21  3:39 PM  Result Value Ref Range Status   C Diff antigen NEGATIVE NEGATIVE Final   C Diff toxin NEGATIVE NEGATIVE Final   C Diff interpretation No C. difficile detected.  Final    Comment: Performed at [facility], [place] [address], [place], [place] [address]      Studies: No results found.  Scheduled Meds:  allopurinol  200 mg Oral Daily   cephALEXin  500 mg Oral Q6H   Chlorhexidine Gluconate Cloth  6 each Topical Daily   feeding supplement  237 mL Oral BID BM   folic acid  1 mg Oral Daily   metoprolol tartrate  25 mg Oral BID   morphine  60 mg Oral Q12H   multivitamin with minerals  1 tablet Oral Daily   potassium chloride SA  40 mEq Oral Daily   rosuvastatin  20 mg Oral Daily    Continuous Infusions:     LOS: 6 days     Kayleen Memos, MD [facility] Pager [phone]  If 7PM-7AM, please contact night-coverage [email or web address] Password [email or web address] 02/09/2021, 1:25 PM

## 2021-02-09 NOTE — Progress Notes (Addendum)
    Progress Note   Subjective  Chief Complaint: RUQ pain, Choledocholithiasis and stage IV non-small cell lung cancer  This morning, the patient tells me he is hungry because he has not been allowed to eat.  Denies any new complaints or concerns.  Continues with a right upper quadrant tenderness, more so if he pushes on this area.  Aware of plans for ERCP tomorrow.   Objective   Vital signs in last 24 hours: Temp:  [97.8 F (36.6 C)-98.7 F (37.1 C)] 98.7 F (37.1 C) (12/06 0421) Pulse Rate:  [64-82] 81 (12/06 0421) Resp:  [14-20] 14 (12/06 0421) BP: (97-154)/(59-66) 97/65 (12/06 0421) SpO2:  [92 %-98 %] 92 % (12/06 0421) Last BM Date: 02/07/21 General:    white male in NAD Abdomen:  Soft, Mild RUQ ttp and nondistended. Normal bowel sounds. Psych:  Cooperative. Normal mood and affect.    Lab Results: Recent Labs    02/08/21 0309 02/08/21 1528 02/09/21 0532  WBC 3.6* 3.9* 3.4*  HGB 8.2* 8.9* 8.8*  HCT 25.3* 27.7* 27.4*  PLT 53* 65* 78*   BMET Recent Labs    02/08/21 0309 02/08/21 1528 02/09/21 0532  NA 140 141 140  K 4.3 4.5 4.4  CL 105 100 103  CO2 31 29 30   GLUCOSE 105* 102* 103*  BUN 21 19 21   CREATININE 1.36* 1.35* 1.24  CALCIUM 8.6* 9.2 9.2   LFT Recent Labs    02/09/21 0532  PROT 6.3*  ALBUMIN 3.0*  AST 63*  ALT 47*  ALKPHOS 68  BILITOT 0.6     Assessment / Plan:   Assessment: 1.  Choledocholithiasis: Now with elevated LFTs and some right upper quadrant pain, plans for ERCP tomorrow 2.  Stage IV non-small cell lung cancer: With pancytopenia status post chemo 3. Thrombocytopenia and leukopenia - improving nicely Plan: 1.  Scheduled for an ERCP tomorrow with Dr. Carlean Purl. 2.  Patient can have a normal diet today and n.p.o. at midnight 3.  Continue to monitor LFTs and CBC  Thank for the kind consultation, we will continue to follow.   LOS: 6 days   Levin Erp  02/09/2021, 9:37 AM     [facility] Attending   I have taken an  interval history, reviewed the chart and examined the patient. I agree with the Advanced Practitioner's note, impression and recommendations.  Majority the medical decision-making in the formulation of the assessment and plan were performed by me.  Gatha Mayer, MD, [facility] 02/09/2021 12:59 PM

## 2021-02-09 NOTE — Progress Notes (Signed)
   02/09/21 1200  Mobility  Activity Ambulated in hall  Level of Assistance Standby assist, set-up cues, supervision of patient - no hands on  Assistive Device None  Distance Ambulated (ft) 350 ft  Mobility Ambulated with assistance in hallway  Mobility Response Tolerated fair  Mobility performed by Mobility specialist  $Mobility charge 1 Mobility   Pt agreeable to mobilize this afternoon. Ambulated about 336ft in hall, tolerated fair. Pt noted he was having pain in both feet, with his pain being 10/10 while ambulating. However, pt insisted on continuing session. Left pt EOB with call bell at side. RN notified of session.   [place] Specialist Acute Rehab Services Office: [phone]

## 2021-02-09 NOTE — H&P (View-Only) (Signed)
    Progress Note   Subjective  Chief Complaint: RUQ pain, Choledocholithiasis and stage IV non-small cell lung cancer  This morning, the patient tells me he is hungry because he has not been allowed to eat.  Denies any new complaints or concerns.  Continues with a right upper quadrant tenderness, more so if he pushes on this area.  Aware of plans for ERCP tomorrow.   Objective   Vital signs in last 24 hours: Temp:  [97.8 F (36.6 C)-98.7 F (37.1 C)] 98.7 F (37.1 C) (12/06 0421) Pulse Rate:  [64-82] 81 (12/06 0421) Resp:  [14-20] 14 (12/06 0421) BP: (97-154)/(59-66) 97/65 (12/06 0421) SpO2:  [92 %-98 %] 92 % (12/06 0421) Last BM Date: 02/07/21 General:    white male in NAD Abdomen:  Soft, Mild RUQ ttp and nondistended. Normal bowel sounds. Psych:  Cooperative. Normal mood and affect.    Lab Results: Recent Labs    02/08/21 0309 02/08/21 1528 02/09/21 0532  WBC 3.6* 3.9* 3.4*  HGB 8.2* 8.9* 8.8*  HCT 25.3* 27.7* 27.4*  PLT 53* 65* 78*   BMET Recent Labs    02/08/21 0309 02/08/21 1528 02/09/21 0532  NA 140 141 140  K 4.3 4.5 4.4  CL 105 100 103  CO2 31 29 30   GLUCOSE 105* 102* 103*  BUN 21 19 21   CREATININE 1.36* 1.35* 1.24  CALCIUM 8.6* 9.2 9.2   LFT Recent Labs    02/09/21 0532  PROT 6.3*  ALBUMIN 3.0*  AST 63*  ALT 47*  ALKPHOS 68  BILITOT 0.6     Assessment / Plan:   Assessment: 1.  Choledocholithiasis: Now with elevated LFTs and some right upper quadrant pain, plans for ERCP tomorrow 2.  Stage IV non-small cell lung cancer: With pancytopenia status post chemo 3. Thrombocytopenia and leukopenia - improving nicely Plan: 1.  Scheduled for an ERCP tomorrow with Dr. Carlean Purl. 2.  Patient can have a normal diet today and n.p.o. at midnight 3.  Continue to monitor LFTs and CBC  Thank for the kind consultation, we will continue to follow.   LOS: 6 days   Levin Erp  02/09/2021, 9:37 AM     [facility] Attending   I have taken an  interval history, reviewed the chart and examined the patient. I agree with the Advanced Practitioner's note, impression and recommendations.  Majority the medical decision-making in the formulation of the assessment and plan were performed by me.  Gatha Mayer, MD, [facility] 02/09/2021 12:59 PM

## 2021-02-10 ENCOUNTER — Other Ambulatory Visit: Payer: PPO

## 2021-02-10 ENCOUNTER — Inpatient Hospital Stay: Payer: PPO

## 2021-02-10 ENCOUNTER — Inpatient Hospital Stay (HOSPITAL_COMMUNITY): Payer: PPO | Admitting: Certified Registered"

## 2021-02-10 ENCOUNTER — Inpatient Hospital Stay: Payer: PPO | Admitting: Internal Medicine

## 2021-02-10 ENCOUNTER — Inpatient Hospital Stay (HOSPITAL_COMMUNITY): Payer: PPO

## 2021-02-10 ENCOUNTER — Encounter (HOSPITAL_COMMUNITY): Payer: Self-pay | Admitting: Internal Medicine

## 2021-02-10 ENCOUNTER — Encounter (HOSPITAL_COMMUNITY)
Admission: EM | Disposition: A | Discharge status: Home / Self Care | Payer: Self-pay | Source: Home / Self Care | Attending: Internal Medicine

## 2021-02-10 DIAGNOSIS — K2970 Gastritis, unspecified, without bleeding: Secondary | ICD-10-CM

## 2021-02-10 DIAGNOSIS — K2990 Gastroduodenitis, unspecified, without bleeding: Secondary | ICD-10-CM

## 2021-02-10 DIAGNOSIS — T451X5A Adverse effect of antineoplastic and immunosuppressive drugs, initial encounter: Secondary | ICD-10-CM | POA: Diagnosis not present

## 2021-02-10 DIAGNOSIS — D61810 Antineoplastic chemotherapy induced pancytopenia: Secondary | ICD-10-CM | POA: Diagnosis not present

## 2021-02-10 HISTORY — PX: SPHINCTEROTOMY: SHX5544

## 2021-02-10 HISTORY — PX: STONE EXTRACTION WITH BASKET: SHX5318

## 2021-02-10 HISTORY — PX: ERCP: SHX5425

## 2021-02-10 HISTORY — PX: BIOPSY: SHX5522

## 2021-02-10 HISTORY — PX: REMOVAL OF STONES: SHX5545

## 2021-02-10 LAB — COMPREHENSIVE METABOLIC PANEL
ALT: 50 U/L — ABNORMAL HIGH (ref 0–44)
AST: 67 U/L — ABNORMAL HIGH (ref 15–41)
Albumin: 2.9 g/dL — ABNORMAL LOW (ref 3.5–5.0)
Alkaline Phosphatase: 59 U/L (ref 38–126)
Anion gap: 7 (ref 5–15)
BUN: 21 mg/dL (ref 8–23)
CO2: 31 mmol/L (ref 22–32)
Calcium: 8.8 mg/dL — ABNORMAL LOW (ref 8.9–10.3)
Chloride: 104 mmol/L (ref 98–111)
Creatinine, Ser: 1.2 mg/dL (ref 0.61–1.24)
GFR, Estimated: >60 mL/min (ref >60)
Glucose, Bld: 96 mg/dL (ref 70–99)
Potassium: 4.4 mmol/L (ref 3.5–5.1)
Sodium: 142 mmol/L (ref 135–145)
Total Bilirubin: 0.6 mg/dL (ref 0.3–1.2)
Total Protein: 6.3 g/dL — ABNORMAL LOW (ref 6.5–8.1)

## 2021-02-10 LAB — CBC
HCT: 25.8 % — ABNORMAL LOW (ref 39.0–52.0)
Hemoglobin: 8.4 g/dL — ABNORMAL LOW (ref 13.0–17.0)
MCH: 31.3 pg (ref 26.0–34.0)
MCHC: 32.6 g/dL (ref 30.0–36.0)
MCV: 96.3 fL (ref 80.0–100.0)
Platelets: 92 10*3/uL — ABNORMAL LOW (ref 150–400)
RBC: 2.68 MIL/uL — ABNORMAL LOW (ref 4.22–5.81)
RDW: 15.5 % (ref 11.5–15.5)
WBC: 3.1 10*3/uL — ABNORMAL LOW (ref 4.0–10.5)
nRBC: 0 % (ref 0.0–0.2)

## 2021-02-10 IMAGING — RF DG ERCP WO/W SPHINCTEROTOMY
1 series · 5 of 5 positions shown · non-contrast
Comparison: CT abdomen pelvis from [DATE]

CLINICAL DATA: 65-year-old male with history of common bile duct
obstruction.

EXAM:
ERCP
TECHNIQUE: Multiple spot images obtained with the fluoroscopic device and
submitted for interpretation post-procedure.
FLUOROSCOPY TIME:  Fluoroscopy Time:  7 minutes, 2 seconds
Radiation Exposure Index (if provided by the fluoroscopic device):
Not provided.
Number of Acquired Spot Images: 5

[Series 1: run · 5 of 5 slices shown]
[im 1/5]
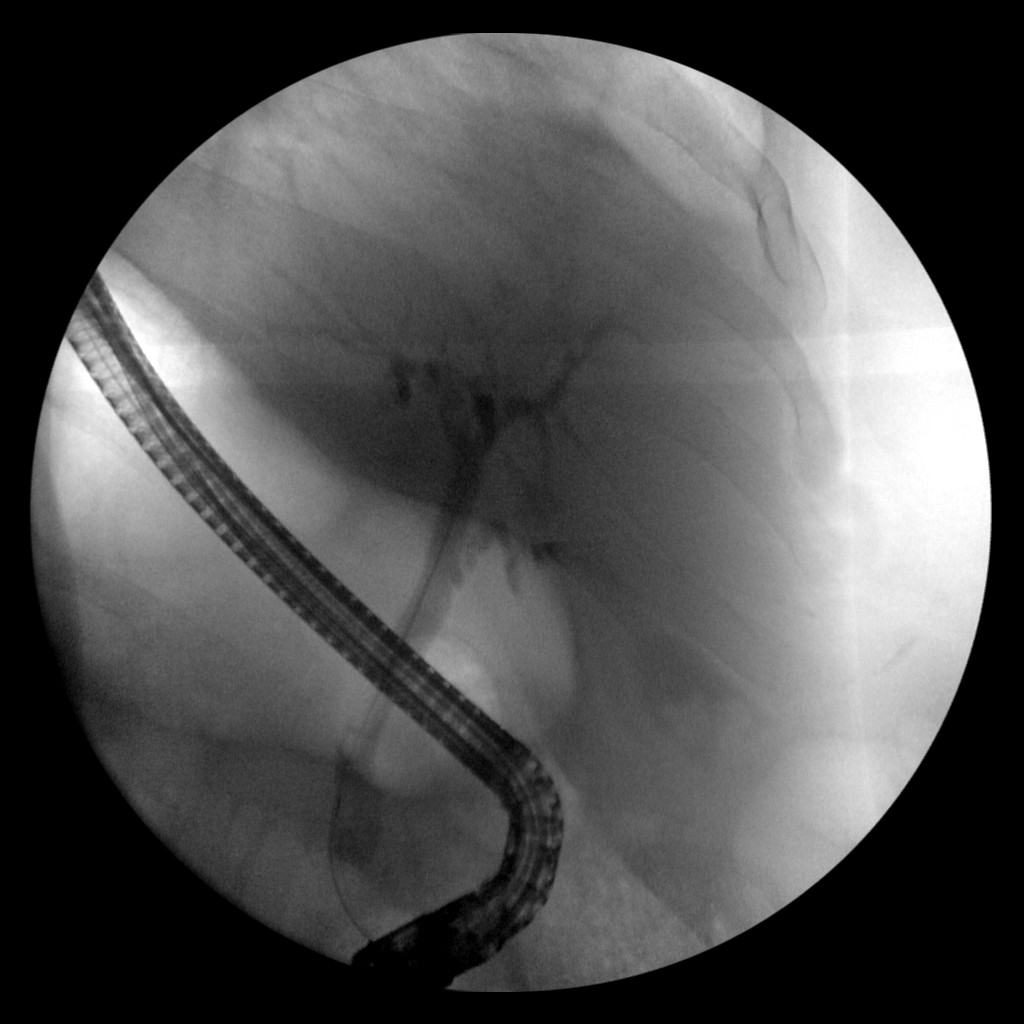
[im 2/5]
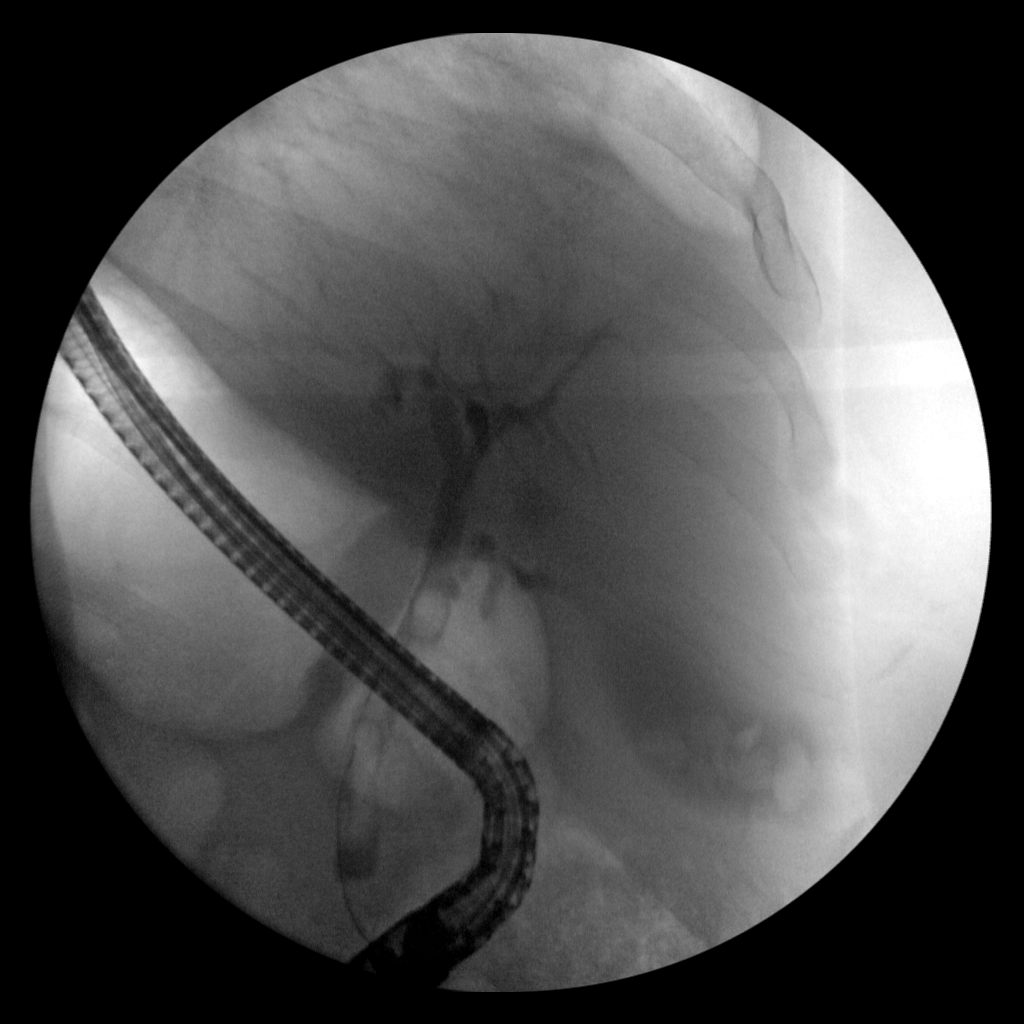
[im 3/5]
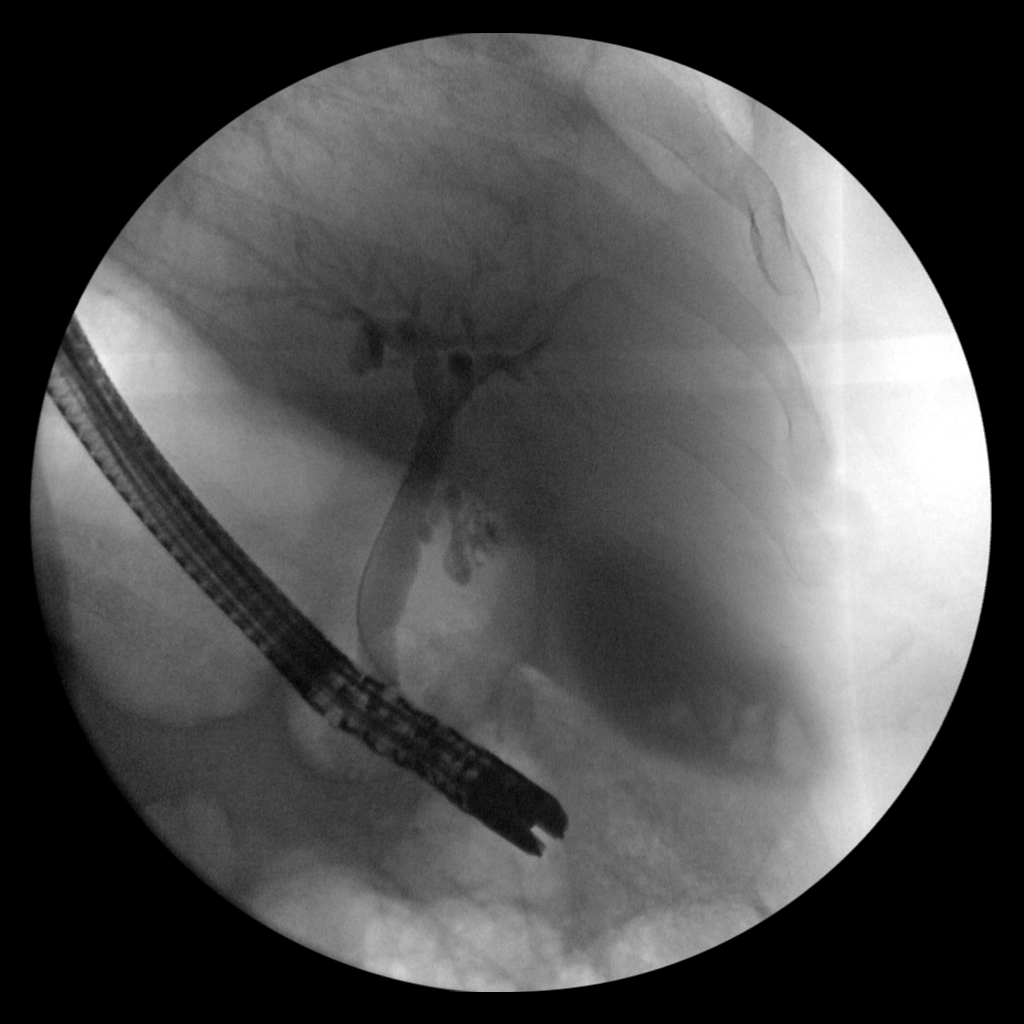
[im 4/5]
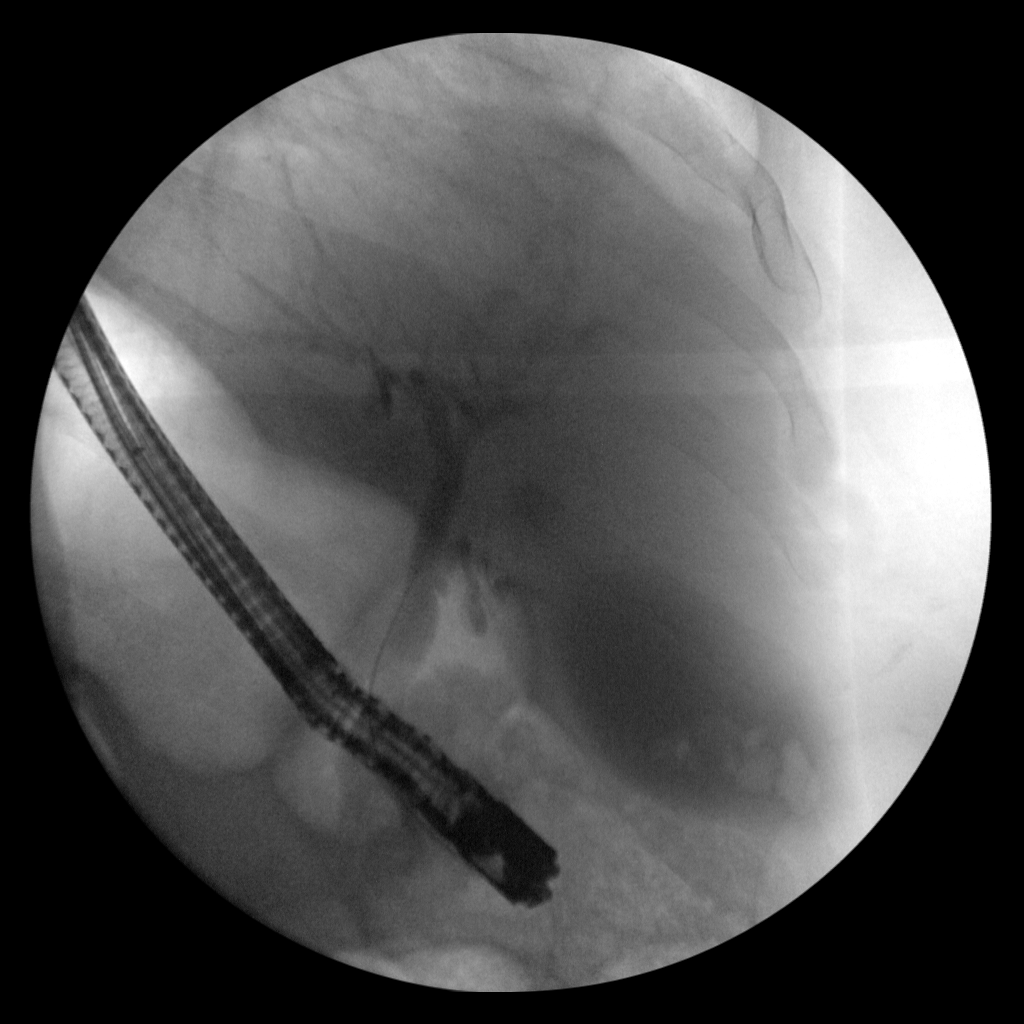
[im 5/5]
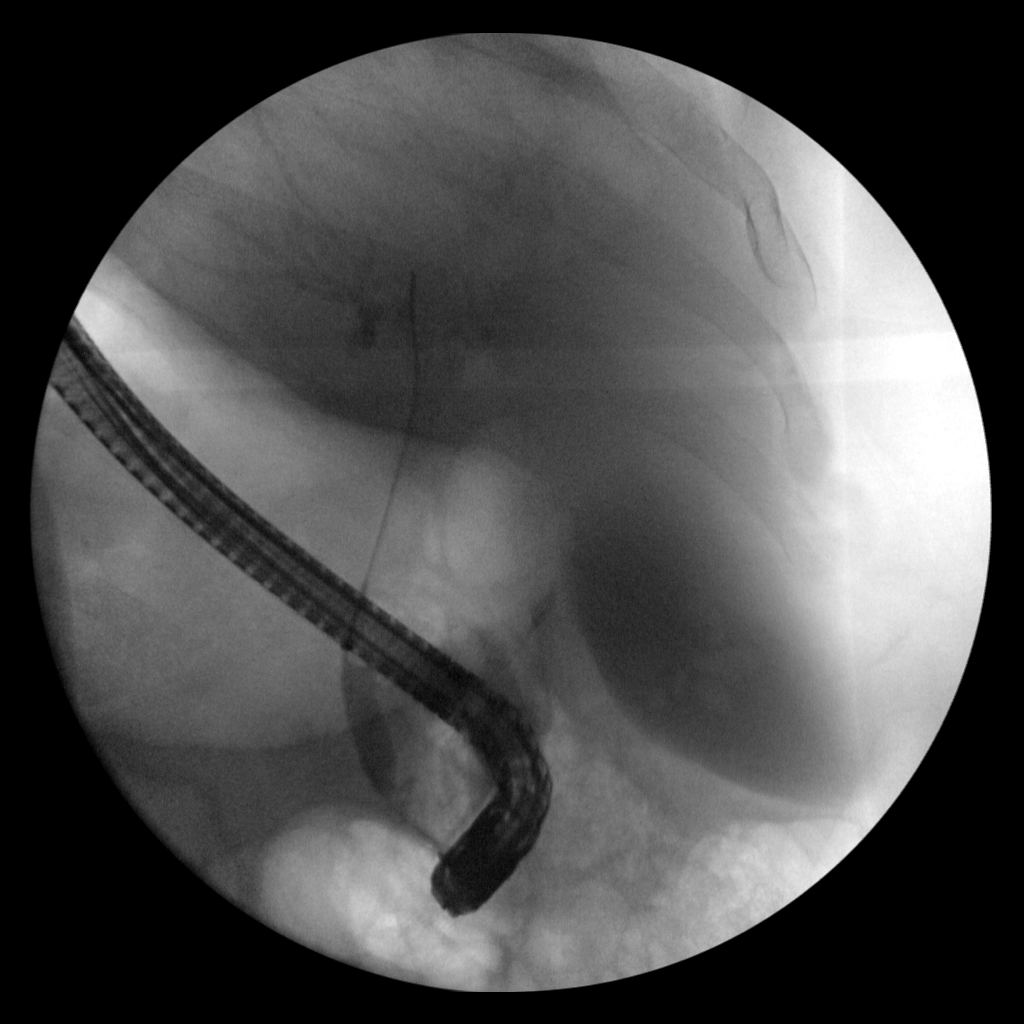

[5 of 5 positions shown; findings below may reference images not displayed]

FINDINGS: Duodenal scope is positioned near the second portion the duodenum.
Retrograde cannulation of the common bile duct is performed and
cholangiogram demonstrates multiple rounded filling defects in the
common bile duct compatible with findings on comparison CT. Balloon
sweep and sphincterotomy is performed with clearance of the filling
defects.
IMPRESSION: Choledocholithiasis with balloon sweep and sphincterotomy.

These images were submitted for radiologic interpretation only.
Please see the procedural report for full procedural details and the
amount of contrast and the fluoroscopy time utilized.

## 2021-02-10 SURGERY — ERCP, WITH INTERVENTION IF INDICATED
Anesthesia: General

## 2021-02-10 MED ORDER — PROPOFOL 10 MG/ML IV BOLUS
INTRAVENOUS | Status: DC | PRN
  Administered 2021-02-10: 110 mg via INTRAVENOUS

## 2021-02-10 MED ORDER — SODIUM CHLORIDE 0.9 % IV SOLN
INTRAVENOUS | Status: DC | PRN
  Administered 2021-02-10: 70 mL

## 2021-02-10 MED ORDER — SUGAMMADEX SODIUM 200 MG/2ML IV SOLN
INTRAVENOUS | Status: DC | PRN
  Administered 2021-02-10: 200 mg via INTRAVENOUS

## 2021-02-10 MED ORDER — INDOMETHACIN 50 MG RE SUPP
RECTAL | Status: DC | PRN
  Administered 2021-02-10: 100 mg via RECTAL

## 2021-02-10 MED ORDER — PHENYLEPHRINE HCL (PRESSORS) 10 MG/ML IV SOLN
INTRAVENOUS | Status: AC
  Filled 2021-02-10: qty 1

## 2021-02-10 MED ORDER — GLUCAGON HCL RDNA (DIAGNOSTIC) 1 MG IJ SOLR
INTRAMUSCULAR | Status: AC
  Filled 2021-02-10: qty 2

## 2021-02-10 MED ORDER — INDOMETHACIN 50 MG RE SUPP
RECTAL | Status: AC
  Filled 2021-02-10: qty 2

## 2021-02-10 MED ORDER — FENTANYL CITRATE (PF) 100 MCG/2ML IJ SOLN
INTRAMUSCULAR | Status: DC | PRN
  Administered 2021-02-10: 50 ug via INTRAVENOUS

## 2021-02-10 MED ORDER — INDOMETHACIN 50 MG RE SUPP: 100.0000 mg | Freq: Once | RECTAL | Status: DC

## 2021-02-10 MED ORDER — LIDOCAINE 2% (20 MG/ML) 5 ML SYRINGE
INTRAMUSCULAR | Status: DC | PRN
  Administered 2021-02-10: 60 mg via INTRAVENOUS

## 2021-02-10 MED ORDER — DEXAMETHASONE SODIUM PHOSPHATE 10 MG/ML IJ SOLN
INTRAMUSCULAR | Status: DC | PRN
  Administered 2021-02-10: 10 mg via INTRAVENOUS

## 2021-02-10 MED ORDER — SODIUM CHLORIDE 0.9 % IV SOLN: INTRAVENOUS | Status: DC

## 2021-02-10 MED ORDER — PROPOFOL 10 MG/ML IV BOLUS
INTRAVENOUS | Status: AC
  Filled 2021-02-10: qty 20

## 2021-02-10 MED ORDER — LACTATED RINGERS IV SOLN: INTRAVENOUS | Status: DC

## 2021-02-10 MED ORDER — ONDANSETRON HCL 4 MG/2ML IJ SOLN
INTRAMUSCULAR | Status: DC | PRN
  Administered 2021-02-10: 4 mg via INTRAVENOUS

## 2021-02-10 MED ORDER — CIPROFLOXACIN IN D5W 400 MG/200ML IV SOLN
INTRAVENOUS | Status: AC
  Filled 2021-02-10: qty 200

## 2021-02-10 MED ORDER — ROCURONIUM BROMIDE 10 MG/ML (PF) SYRINGE
PREFILLED_SYRINGE | INTRAVENOUS | Status: DC | PRN
  Administered 2021-02-10: 50 mg via INTRAVENOUS

## 2021-02-10 MED ORDER — FENTANYL CITRATE (PF) 100 MCG/2ML IJ SOLN
INTRAMUSCULAR | Status: AC
  Filled 2021-02-10: qty 2

## 2021-02-10 MED ORDER — PANTOPRAZOLE SODIUM 40 MG PO TBEC
40.0000 mg | DELAYED_RELEASE_TABLET | Freq: Every day | ORAL | Status: DC
  Administered 2021-02-10 – 2021-02-13 (×4): 40 mg via ORAL
  Filled 2021-02-10 (×4): qty 1

## 2021-02-10 MED ORDER — PHENYLEPHRINE HCL-NACL 20-0.9 MG/250ML-% IV SOLN
INTRAVENOUS | Status: DC | PRN
  Administered 2021-02-10: 50 ug/min via INTRAVENOUS

## 2021-02-10 MED ORDER — PHENYLEPHRINE 40 MCG/ML (10ML) SYRINGE FOR IV PUSH (FOR BLOOD PRESSURE SUPPORT)
PREFILLED_SYRINGE | INTRAVENOUS | Status: DC | PRN
  Administered 2021-02-10: 120 ug via INTRAVENOUS
  Administered 2021-02-10 (×2): 80 ug via INTRAVENOUS
  Administered 2021-02-10: 120 ug via INTRAVENOUS

## 2021-02-10 NOTE — Transfer of Care (Signed)
Immediate Anesthesia Transfer of Care Note  Patient: [name] [name]  Procedure(s) Performed: ENDOSCOPIC RETROGRADE CHOLANGIOPANCREATOGRAPHY (ERCP) SPHINCTEROTOMY  Patient Location: PACU  Anesthesia Type:General  Level of Consciousness: awake, alert , oriented and patient cooperative  Airway & Oxygen Therapy: Patient Spontanous Breathing and Patient connected to face mask oxygen  Post-op Assessment: Report given to RN, Post -op Vital signs reviewed and stable and Patient moving all extremities  Post vital signs: Reviewed and stable  Last Vitals:  Vitals Value Taken Time  BP    Temp    Pulse 73 02/10/21 1409  Resp 20 02/10/21 1409  SpO2 93 % 02/10/21 1409  Vitals shown include unvalidated device data.  Last Pain:  Vitals:   02/10/21 1201  TempSrc: Oral  PainSc: 8       Patients Stated Pain Goal: 0 (41/66/06 3016)  Complications: No notable events documented.

## 2021-02-10 NOTE — Op Note (Signed)
[facility] Patient Name: [name] [name] Procedure Date: 02/10/2021 MRN: [id number] Attending MD: Gatha Mayer , MD Date of Birth: Jan 19, 1956 CSN: [id number] Age: 65 Admit Type: Inpatient Procedure:                ERCP Indications:              Bile duct stone(s), For therapy of bile duct                            stone(s) Providers:                Gatha Mayer, MD, Burtis Junes, RN, Benetta Spar, Technician Referring MD:              Medicines:                General Anesthesia Complications:            No immediate complications. Estimated Blood Loss:     Estimated blood loss was minimal. Procedure:                Pre-Anesthesia Assessment:                           - Prior to the procedure, a History and Physical                            was performed, and patient medications and                            allergies were reviewed. The patient's tolerance of                            previous anesthesia was also reviewed. The risks                            and benefits of the procedure and the sedation                            options and risks were discussed with the patient.                            All questions were answered, and informed consent                            was obtained. Prior Anticoagulants: The patient                            last took antiplatelet medication 7 days prior to                            the procedure. ASA Grade Assessment: IV - A patient  with severe systemic disease that is a constant                            threat to life. After reviewing the risks and                            benefits, the patient was deemed in satisfactory                            condition to undergo the procedure.                           After obtaining informed consent, the scope was                            passed under direct vision. Throughout the                             procedure, the patient's blood pressure, pulse, and                            oxygen saturations were monitored continuously. The                            [id number]-Q190V ([id number]) Olympus duodenoscope was                            introduced through the mouth, and used to inject                            contrast into and used to inject contrast into the                            bile duct. The ERCP was somewhat difficult due to                            challenging cannulation because of abnormal                            anatomy. The patient tolerated the procedure well. Scope In: Scope Out: Findings:      The scout film was normal. Esophagus not seen well. Stomach with       scattered pre-pyloric erosions and duodenal bulb same. papilla in second       duodenum - small and slightly inflamed. Positioning of duodenoscope a       little difficult - rotated the sphincterotome right x 2 for better       approach and wire-guided cannulation obtained with cholangiogram showing       about a 9-10 mm CBD with multiple stones and sludge. gallbladder with       stones also. Biliary shpinterotomy performed and balloon sweeps and       basket sweeps used to clear bile duct of multiple dark pigmented stones       and sludge. Pancreas not entered by intent. Gastric biopsies taken of  erosions. Impression:               - Choledocholithiasis was found. Complete removal                            was accomplished by biliary sphincterotomy and                            balloon extraction.                           - Gastritis. Biopsied.                           - Duodenitis. Moderate Sedation:      Not Applicable - Patient had care per Anesthesia. Recommendation:           - Return patient to hospital ward for ongoing care.                           - Clear liquid diet for now                           Avoid anti-platelets (Brilinta) for now - will                            decide when to  restart - as long as no problems                            overnight should be ok to restart                           F/U LFT's and CBC in AM                           f/U gastric biopsies                           Sisiter updated by phone                           would not pursue cholecystectomy at this point Procedure Code(s):        --- Professional ---                           [phone], Endoscopic retrograde                            cholangiopancreatography (ERCP); diagnostic,                            including collection of specimen(s) by brushing or                            washing, when performed (separate procedure) Diagnosis Code(s):        --- Professional ---  K80.50, Calculus of bile duct without cholangitis                            or cholecystitis without obstruction                           K29.70, Gastritis, unspecified, without bleeding                           K29.80, Duodenitis without bleeding CPT copyright 2019 American Medical Association. All rights reserved. The codes documented in this report are preliminary and upon coder review may  be revised to meet current compliance requirements. Gatha Mayer, MD 02/10/2021 2:21:32 PM This report has been signed electronically. Number of Addenda: 0

## 2021-02-10 NOTE — Anesthesia Preprocedure Evaluation (Signed)
Anesthesia Evaluation  Patient identified by MRN, date of birth, ID band Patient awake    Reviewed: Allergy & Precautions, NPO status , Patient's Chart, lab work & pertinent test results  Airway Mallampati: II  TM Distance: >3 FB Neck ROM: Full    Dental  (+) Dental Advisory Given   Pulmonary COPD,  COPD inhaler, former smoker,  Lung CA. On chemo   breath sounds clear to auscultation       Cardiovascular hypertension, Pt. on medications and Pt. on home beta blockers + CAD, + Past MI, + Cardiac Stents, +CHF and + DOE   Rhythm:Regular Rate:Normal     Neuro/Psych  Headaches,  Neuromuscular disease    GI/Hepatic Neg liver ROS, GERD  ,  Endo/Other  negative endocrine ROS  Renal/GU negative Renal ROS     Musculoskeletal   Abdominal   Peds  Hematology  (+) anemia , Pancytopenia 2/2 chemo   Anesthesia Other Findings   Reproductive/Obstetrics                             Anesthesia Physical Anesthesia Plan  ASA: 4  Anesthesia Plan: General   Post-op Pain Management: Minimal or no pain anticipated   Induction: Intravenous  PONV Risk Score and Plan: 2 and Dexamethasone, Ondansetron and Treatment may vary due to age or medical condition  Airway Management Planned: Oral ETT  Additional Equipment: None  Intra-op Plan:   Post-operative Plan: Extubation in OR  Informed Consent: I have reviewed the patients History and Physical, chart, labs and discussed the procedure including the risks, benefits and alternatives for the proposed anesthesia with the patient or authorized representative who has indicated his/her understanding and acceptance.     Dental advisory given  Plan Discussed with: CRNA  Anesthesia Plan Comments:         Anesthesia Quick Evaluation

## 2021-02-10 NOTE — Anesthesia Postprocedure Evaluation (Signed)
Anesthesia Post Note  Patient: TAJH LIVSEY  Procedure(s) Performed: ENDOSCOPIC RETROGRADE CHOLANGIOPANCREATOGRAPHY (ERCP) SPHINCTEROTOMY REMOVAL OF STONES STONE EXTRACTION WITH BASKET BIOPSY     Patient location during evaluation: PACU Anesthesia Type: General Level of consciousness: awake and alert Pain management: pain level controlled Vital Signs Assessment: post-procedure vital signs reviewed and stable Respiratory status: spontaneous breathing, nonlabored ventilation, respiratory function stable and patient connected to nasal cannula oxygen Cardiovascular status: blood pressure returned to baseline and stable Postop Assessment: no apparent nausea or vomiting Anesthetic complications: no   No notable events documented.  Last Vitals:  Vitals:   02/10/21 1432 02/10/21 1453  BP: 97/64 119/81  Pulse: 69 67  Resp: (!) 9 16  Temp:  (!) 36.3 C  SpO2: 90% 97%    Last Pain:  Vitals:   02/10/21 1453  TempSrc: Oral  PainSc:                  Tiajuana Amass

## 2021-02-10 NOTE — Interval H&P Note (Signed)
History and Physical Interval Note:  02/10/2021 12:32 PM  [name] [name]  has presented today for surgery, with the diagnosis of Choledocholithiasis.  The various methods of treatment have been discussed with the patient and family. After consideration of risks, benefits and other options for treatment, the patient has consented to  Procedure(s): ENDOSCOPIC RETROGRADE CHOLANGIOPANCREATOGRAPHY (ERCP) (N/A) as a surgical intervention.  The patient's history has been reviewed, patient examined, no change in status, stable for surgery.  I have reviewed the patient's chart and labs.  Questions were answered to the patient's satisfaction.     Silvano Rusk

## 2021-02-10 NOTE — Progress Notes (Addendum)
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  He was advised to go to the ED for further evaluation and management.  He received 2 units PRBCs for hemoglobin of 6.8K and 2 units of platelets for platelet count less than 5000.  Anemia stabilized, thrombocytopenia improving, oncology have signed off.  Left foot cellulitis has resolved, completes oral antibiotics 12/9.  [facility] consulted for symptomatic choledocholithiasis, underwent ERCP 12/7 with intervention.  Assessment/Plan: Principal Problem:   Antineoplastic chemotherapy induced pancytopenia (HCC) Active Problems:   Essential hypertension   Chronic pain   Anxiety   Right lower lobe lung mass   Thrombocytopenia (HCC)   COPD (chronic obstructive pulmonary disease) (HCC)   Pancytopenia (HCC)   Pancytopenia due to antineoplastic chemotherapy (HCC)   Choledocholithiasis   Gastritis and gastroduodenitis  Chemotherapy-induced severe pancytopenia Admission CBC on 11/30: WBC 1.3, ANC 0.7, hemoglobin 7.7 and platelet count <5. Pancytopenia due to adverse reaction of a chemotherapeutic agent used to provide palliative chemotherapy for his stage IV (T3,N2,M1c) NSCLCA with left adrenal metastasis and now malignant thoracic lymphadenopathy.   Received Alimta 500 mg/M2, Carboplatin, and Keytruda.  It is thought that the cytopenia is due to the [place]. The plan going forward will be to discontinue this agent.  He received 2 units PRBCs transfusion on 01/27/2021 for  hemoglobin of 6.8K.  He also received 2 pheresis packs of platelets.   Management per medical oncology-have since signed off Hemoglobin stable in the 8 g range for several days, thrombocytopenia gradually improving.  Mild leukopenia.  Symptomatic choledocholithiasis with abnormal LFTs  AST and ALT peaked at 67/50 respectively.  Total bilirubin and alkaline phosphatase normal. [facility] consulted and s/p ERCP 12/7, complete removal of stones by biliary sphincterotomy and balloon extraction.  Gastritis and duodenitis Noted on ERCP 12/7.  Biopsied. Follow-up biopsy results. GI will recommend regarding timing of resuming antiplatelets.  Hypokalemia/hypomagnesemia Replaced and resolved.  Left foot cellulitis, POA MRSA screening negative Blood cultures negative to date Received Rocephin and IV vancomycin Keflex started on 02/05/2021> 02/12/2021. Cellulitis is clinically resolved.  Resolved AKI, suspect prerenal in the setting of dehydration. Baseline creatinine appears to be 1.0 with GFR greater than 60. Presented with creatinine of 2.21 Creatinine has plateaued at 1.2.  AKI resolved after IV fluids. Continue to avoid nephrotoxic agents, dehydration and hypotension.  Right lower lobe lung mass NSCLCA: stage IV (T3,N2,M1c) with malignant lymphadenopathy Outpatient follow-up with oncology.   Chronic anxiety/depression Continue home regimen  COPD (chronic obstructive pulmonary disease) (HCC) Stable. Albuterol nebs will be made available on a prn basis.   Chronic pain Cancer related pain.  Continue pain management Continue bowel regimen.    CODE STATUS: Full Code DVT Prophylaxis: None due to severe thrombocytopenia and symptomatic anemia requiring blood transfusion.  SCDs. Family Communication: Updated sister at bedside prior to procedure. From: Home Disposition: Home    Disposition Plan: Will likely discharge to home after ERCP possibly on  02/11/2021.   Consultants: Medical oncology GI  Procedures: S/p ERCP on 02/11/2019.  Antimicrobials: Rocephin IV vancomycin  Keflex started on 02/05/2021.   Status is: Inpatient  Inpatient status.  Patient requires at least 2 midnights for further evaluation and treatment of present condition.   Subjective: Seen this morning prior to procedure.  Reported intermittent abdominal pain, shows epigastric/periumbilical region.  No nausea or vomiting.   Objective: Vitals:   02/10/21 1411 02/10/21 1418 02/10/21 1432 02/10/21 1453  BP: 131/71 (!) 109/55 97/64 119/81  Pulse: 73 71 69 67  Resp: 13 12 (!) 9 16  Temp: 97.7 F (36.5 C)   (!) 97.4 F (36.3 C)  TempSrc: Oral   Oral  SpO2: 92% 94% 90% 97%  Weight:      Height:        Intake/Output Summary (Last 24 hours) at 02/10/2021 1542 Last data filed at 02/10/2021 1410 Gross per 24 hour  Intake 500 ml  Output 0 ml  Net 500 ml   Filed Weights   02/03/21 1255  Weight: 103.9 kg    Exam:  General: 65 y.o. year-old male well-developed well-nourished in no acute distress.  He is alert and oriented x3. Cardiovascular: S1 and S2 heard, RRR.  No JVD, murmurs or pedal edema.  Telemetry personally reviewed: Sinus rhythm. Respiratory: Clear to auscultation.  No increased work of breathing.  Right upper anterior chest Port-A-Cath in place. Abdomen: Nondistended and soft.  Mild epigastric/mid abdominal tenderness without guarding, rigidity or rebound.  No organomegaly or masses appreciated.  Normal bowel sounds heard. Musculoskeletal: No lower extremity edema bilaterally.   Skin: Left foot dorsal cellulitis findings have clinically resolved. psychiatry: Mood is appropriate for condition and setting.  Data Reviewed: CBC: Recent Labs  Lab 02/04/21 0743 02/05/21 0550 02/06/21 0302 02/07/21 0310 02/08/21 0309 02/08/21 1528 02/09/21 0532 02/10/21 0253  WBC 5.2 12.2* 15.0* 9.5 3.6* 3.9* 3.4* 3.1*  NEUTROABS 4.1 10.4* 12.6* 7.1  2.0  --   --   --   HGB 9.1* 9.2* 8.5* 8.7* 8.2* 8.9* 8.8* 8.4*  HCT 26.3* 26.6* 25.7* 26.9* 25.3* 27.7* 27.4* 25.8*  MCV 88.6 89.3 93.1 94.1 95.1 94.2 94.8 96.3  PLT 12* 31* 38* 46* 53* 65* 78* 92*   Basic Metabolic Panel: Recent Labs  Lab 02/05/21 0550 02/06/21 0302 02/07/21 0310 02/08/21 0309 02/08/21 1528 02/09/21 0532 02/10/21 0253  NA 137 140 138 140 141 140 142  K 2.6* 3.2* 3.9 4.3 4.5 4.4 4.4  CL 101 106 105 105 100 103 104  CO2 25 28 28 31 29 30 31   GLUCOSE 102* 111* 105* 105* 102* 103* 96  BUN 22 16 18 21 19 21 21   CREATININE 1.49* 1.30* 1.35* 1.36* 1.35* 1.24 1.20  CALCIUM 7.9* 8.0* 8.3* 8.6* 9.2 9.2 8.8*  MG 1.1* 2.0 1.7 1.7  --   --   --   PHOS 2.5  --   --  3.8  --   --   --    GFR: Estimated Creatinine Clearance: 71.7 mL/min (by C-G formula based on SCr of 1.2 mg/dL). Liver Function Tests: Recent Labs  Lab 02/06/21 0255 02/08/21 0309 02/08/21 1528 02/09/21 0532 02/10/21 0253  AST 39 64* 66* 63* 67*  ALT 22 41 43 47* 50*  ALKPHOS 67 67 70 68 59  BILITOT 0.7 0.7 0.5 0.6 0.6  PROT 6.1* 6.0* 6.5 6.3* 6.3*  ALBUMIN 2.9* 2.9* 3.2* 3.0* 2.9*    Coagulation Profile: Recent Labs  Lab 02/04/21 0334  INR 1.1   Recent Results (from the past 240 hour(s))  Culture, blood (x 2)  Status: None   Collection Time: 02/03/21  9:06 PM   Specimen: BLOOD  Result Value Ref Range Status   Specimen Description   Final    BLOOD BLOOD RIGHT HAND Performed at [facility] [address] [place], [place] [address]    Special Requests   Final    BOTTLES DRAWN AEROBIC AND ANAEROBIC Blood Culture adequate volume Performed at [facility] [address], [place], [place] [address]    Culture   Final    NO GROWTH 5 DAYS Performed at [facility], [place] [address], [place], [place] [address]    Report Status 02/09/2021 FINAL  Final  Culture, blood (x 2)     Status: None   Collection Time: 02/03/21  9:06 PM   Specimen:  BLOOD  Result Value Ref Range Status   Specimen Description   Final    BLOOD BLOOD LEFT FOREARM Performed at [facility] [address], [place], [place] [address]    Special Requests   Final    BOTTLES DRAWN AEROBIC AND ANAEROBIC Blood Culture adequate volume Performed at [facility] [address], [place], [place] [address]    Culture   Final    NO GROWTH 5 DAYS Performed at [facility], [place] [address], [place],  [address]    Report Status 02/09/2021 FINAL  Final  Resp Panel by RT-PCR (Flu A&B, Covid) Nasopharyngeal Swab     Status: None   Collection Time: 02/03/21 11:21 PM   Specimen: Nasopharyngeal Swab; Nasopharyngeal(NP) swabs in vial transport medium  Result Value Ref Range Status   SARS Coronavirus 2 by RT PCR NEGATIVE NEGATIVE Final    Comment: (NOTE) SARS-CoV-2 target nucleic acids are NOT DETECTED.  The SARS-CoV-2 RNA is generally detectable in upper respiratory specimens during the acute phase of infection. The lowest concentration of SARS-CoV-2 viral copies this assay can detect is 138 copies/mL. A negative result does not preclude SARS-Cov-2 infection and should not be used as the sole basis for treatment or other patient management decisions. A negative result may occur with  improper specimen collection/handling, submission of specimen other than nasopharyngeal swab, presence of viral mutation(s) within the areas targeted by this assay, and inadequate number of viral copies(<138 copies/mL). A negative result must be combined with clinical observations, patient history, and epidemiological information. The expected result is Negative.  Fact Sheet for Patients:  EntrepreneurPulse.com.au  Fact Sheet for Healthcare Providers:  IncredibleEmployment.be  This test is no t yet approved or cleared by the [place] FDA and  has been authorized for detection and/or diagnosis  of SARS-CoV-2 by FDA under an Emergency Use Authorization (EUA). This EUA will remain  in effect (meaning this test can be used) for the duration of the COVID-19 declaration under Section 564(b)(1) of the Act, 21 U.S.C.section 360bbb-3(b)(1), unless the authorization is terminated  or revoked sooner.       Influenza A by PCR NEGATIVE NEGATIVE Final   Influenza B by PCR NEGATIVE NEGATIVE Final    Comment: (NOTE) The Xpert Xpress SARS-CoV-2/FLU/RSV plus assay is intended as an aid in the diagnosis of influenza from Nasopharyngeal swab specimens and should not be used as a sole basis for treatment. Nasal washings and aspirates are unacceptable for Xpert Xpress SARS-CoV-2/FLU/RSV testing.  Fact Sheet for Patients: EntrepreneurPulse.com.au  Fact Sheet for Healthcare Providers: IncredibleEmployment.be  This test is not yet approved or cleared by the [place] FDA and has  been authorized for detection and/or diagnosis of SARS-CoV-2 by FDA under an Emergency Use Authorization (EUA). This EUA will remain in effect (meaning this test can be used) for the duration of the COVID-19 declaration under Section 564(b)(1) of the Act, 21 U.S.C. section 360bbb-3(b)(1), unless the authorization is terminated or revoked.  Performed at [facility], [place] [address] [place], [place] [address]   MRSA Next Gen by PCR, Nasal     Status: None   Collection Time: 02/04/21  4:37 PM   Specimen: Nasal Mucosa; Nasal Swab  Result Value Ref Range Status   MRSA by PCR Next Gen NOT DETECTED NOT DETECTED Final    Comment: (NOTE) The GeneXpert MRSA Assay (FDA approved for NASAL specimens only), is one component of a comprehensive MRSA colonization surveillance program. It is not intended to diagnose MRSA infection nor to guide or monitor treatment for MRSA infections. Test performance is not FDA approved in patients less than 31 years old. Performed at  [facility], [place] [address], [place], [place] [address]   Gastrointestinal Panel by PCR , Stool     Status: None   Collection Time: 02/05/21  3:39 PM   Specimen: Stool  Result Value Ref Range Status   Campylobacter species NOT DETECTED NOT DETECTED Final   Plesimonas shigelloides NOT DETECTED NOT DETECTED Final   Salmonella species NOT DETECTED NOT DETECTED Final   Yersinia enterocolitica NOT DETECTED NOT DETECTED Final   Vibrio species NOT DETECTED NOT DETECTED Final   Vibrio cholerae NOT DETECTED NOT DETECTED Final   Enteroaggregative E coli (EAEC) NOT DETECTED NOT DETECTED Final   Enteropathogenic E coli (EPEC) NOT DETECTED NOT DETECTED Final   Enterotoxigenic E coli (ETEC) NOT DETECTED NOT DETECTED Final   Shiga like toxin producing E coli (STEC) NOT DETECTED NOT DETECTED Final   Shigella/Enteroinvasive E coli (EIEC) NOT DETECTED NOT DETECTED Final   Cryptosporidium NOT DETECTED NOT DETECTED Final   Cyclospora cayetanensis NOT DETECTED NOT DETECTED Final   Entamoeba histolytica NOT DETECTED NOT DETECTED Final   Giardia lamblia NOT DETECTED NOT DETECTED Final   Adenovirus F40/41 NOT DETECTED NOT DETECTED Final   Astrovirus NOT DETECTED NOT DETECTED Final   Norovirus GI/GII NOT DETECTED NOT DETECTED Final   Rotavirus A NOT DETECTED NOT DETECTED Final   Sapovirus (I, II, IV, and V) NOT DETECTED NOT DETECTED Final    Comment: Performed at [facility], [place] [place], [place] [address]  C Difficile Quick Screen w PCR reflex     Status: None   Collection Time: 02/05/21  3:39 PM  Result Value Ref Range Status   C Diff antigen NEGATIVE NEGATIVE Final   C Diff toxin NEGATIVE NEGATIVE Final   C Diff interpretation No C. difficile detected.  Final    Comment: Performed at [facility], [place] [address], [place], [place] [address]      Studies: DG ERCP WITH SPHINCTEROTOMY  Result Date: 02/10/2021 CLINICAL DATA:  65 year old  male with history of common bile duct obstruction. EXAM: ERCP TECHNIQUE: Multiple spot images obtained with the fluoroscopic device and submitted for interpretation post-procedure. FLUOROSCOPY TIME:  Fluoroscopy Time:  7 minutes, 2 seconds Radiation Exposure Index (if provided by the fluoroscopic device): Not provided. Number of Acquired Spot Images: 5 COMPARISON:  CT abdomen pelvis from 02/05/2021 FINDINGS: Duodenal scope is positioned near the second portion the duodenum. Retrograde cannulation of the common bile duct is performed and cholangiogram demonstrates multiple rounded filling  defects in the common bile duct compatible with findings on comparison CT. Balloon sweep and sphincterotomy is performed with clearance of the filling defects. IMPRESSION: Choledocholithiasis with balloon sweep and sphincterotomy. These images were submitted for radiologic interpretation only. Please see the procedural report for full procedural details and the amount of contrast and the fluoroscopy time utilized. Electronically Signed   By: Ruthann Cancer M.D.   On: 02/10/2021 14:30   DG C-Arm 1-60 Min-No Report  Result Date: 02/10/2021 Fluoroscopy was utilized by the requesting physician.  No radiographic interpretation.    Scheduled Meds:  allopurinol  200 mg Oral Daily   cephALEXin  500 mg Oral Q6H   Chlorhexidine Gluconate Cloth  6 each Topical Daily   feeding supplement  237 mL Oral BID BM   folic acid  1 mg Oral Daily   indomethacin  100 mg Rectal Once   metoprolol tartrate  25 mg Oral BID   morphine  60 mg Oral Q12H   multivitamin with minerals  1 tablet Oral Daily   potassium chloride SA  40 mEq Oral Daily   rosuvastatin  20 mg Oral Daily    Continuous Infusions:     LOS: 7 days     Vernell Leep, MD,  FACP, [facility], [facility], [facility] (Care Management Physician Certified) [place]  To contact the attending provider between 7A-7P or the covering provider during  after hours 7P-7A, please log into the web site [email or web address] and access using universal [place] password for that web site. If you do not have the password, please call the hospital operator.

## 2021-02-10 NOTE — Anesthesia Procedure Notes (Signed)
Procedure Name: Intubation Date/Time: 02/10/2021 12:41 PM Performed by: Myna Bright, CRNA Pre-anesthesia Checklist: Patient identified, Emergency Drugs available, Suction available and Patient being monitored Patient Re-evaluated:Patient Re-evaluated prior to induction Oxygen Delivery Method: Circle system utilized Preoxygenation: Pre-oxygenation with 100% oxygen Induction Type: IV induction Ventilation: Mask ventilation without difficulty Laryngoscope Size: Mac and 4 Grade View: Grade I Tube type: Oral Tube size: 7.5 mm Number of attempts: 1 Airway Equipment and Method: Stylet Placement Confirmation: ETT inserted through vocal cords under direct vision, positive ETCO2 and breath sounds checked- equal and bilateral Secured at: 22 cm Tube secured with: Tape Dental Injury: Teeth and Oropharynx as per pre-operative assessment

## 2021-02-11 DIAGNOSIS — K8050 Calculus of bile duct without cholangitis or cholecystitis without obstruction: Secondary | ICD-10-CM

## 2021-02-11 LAB — COMPREHENSIVE METABOLIC PANEL
ALT: 54 U/L — ABNORMAL HIGH (ref 0–44)
AST: 70 U/L — ABNORMAL HIGH (ref 15–41)
Albumin: 3.1 g/dL — ABNORMAL LOW (ref 3.5–5.0)
Alkaline Phosphatase: 61 U/L (ref 38–126)
Anion gap: 7 (ref 5–15)
BUN: 23 mg/dL (ref 8–23)
CO2: 28 mmol/L (ref 22–32)
Calcium: 8.8 mg/dL — ABNORMAL LOW (ref 8.9–10.3)
Chloride: 100 mmol/L (ref 98–111)
Creatinine, Ser: 1.37 mg/dL — ABNORMAL HIGH (ref 0.61–1.24)
GFR, Estimated: 57 mL/min — ABNORMAL LOW (ref >60)
Glucose, Bld: 141 mg/dL — ABNORMAL HIGH (ref 70–99)
Potassium: 5.2 mmol/L — ABNORMAL HIGH (ref 3.5–5.1)
Sodium: 135 mmol/L (ref 135–145)
Total Bilirubin: 1 mg/dL (ref 0.3–1.2)
Total Protein: 6.4 g/dL — ABNORMAL LOW (ref 6.5–8.1)

## 2021-02-11 LAB — CBC
HCT: 26.1 % — ABNORMAL LOW (ref 39.0–52.0)
Hemoglobin: 8.5 g/dL — ABNORMAL LOW (ref 13.0–17.0)
MCH: 30.7 pg (ref 26.0–34.0)
MCHC: 32.6 g/dL (ref 30.0–36.0)
MCV: 94.2 fL (ref 80.0–100.0)
Platelets: 138 10*3/uL — ABNORMAL LOW (ref 150–400)
RBC: 2.77 MIL/uL — ABNORMAL LOW (ref 4.22–5.81)
RDW: 14.8 % (ref 11.5–15.5)
WBC: 5.1 10*3/uL (ref 4.0–10.5)
nRBC: 0 % (ref 0.0–0.2)

## 2021-02-11 LAB — SURGICAL PATHOLOGY

## 2021-02-11 MED ORDER — SODIUM CHLORIDE 0.9 % IV SOLN: INTRAVENOUS | Status: AC

## 2021-02-11 NOTE — Progress Notes (Signed)
Nutrition Follow-up  DOCUMENTATION CODES:   Obesity unspecified  INTERVENTION:   -Ensure Enlive po BID, each supplement provides 350 kcal and 20 grams of protein    -Multivitamin with minerals daily   NUTRITION DIAGNOSIS:   Increased nutrient needs related to cancer and cancer related treatments as evidenced by estimated needs.  Ongoing.  GOAL:   Patient will meet greater than or equal to 90% of their needs  Progressing.  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 Admitted for thrombocytopenia.  12/7: s/p ERCP  Patient currently consuming 75-100% of meals. Accepting Ensure supplements.  Admission weight: 229 lbs No other weights this admission.  Medications: Folic acid, Multivitamin with minerals daily   Labs reviewed: Elevated K  Diet Order:   Diet Order             Diet regular Room service appropriate? Yes; Fluid consistency: Thin  Diet effective now                   EDUCATION NEEDS:   No education needs have been identified at this time  Skin:  Skin Assessment: Reviewed RN Assessment  Last BM:  12/7  Height:   Ht Readings from Last 1 Encounters:  02/03/21 5\' 8"  (1.727 m)    Weight:   Wt Readings from Last 1 Encounters:  02/03/21 103.9 kg    BMI:  Body mass index is 34.82 kg/m.  Estimated Nutritional Needs:   Kcal:  2300-2500  Protein:  105-120g  Fluid:  2.3L/day  [name] Bibles, MS, RD, LDN Inpatient Clinical Dietitian Contact information available via Amion

## 2021-02-11 NOTE — Care Management Important Message (Signed)
Important Message  Patient Details IM Letter given to the Patient. Name: [name] [name] MRN: [id number] Date of Birth: Mar 17, 1955   Medicare Important Message Given:  Yes     Kerin Salen 02/11/2021, 11:37 AM

## 2021-02-11 NOTE — Progress Notes (Signed)
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  He was advised to go to the ED for further evaluation and management.  He received 2 units PRBCs for hemoglobin of 6.8K and 2 units of platelets for platelet count less than 5000.  Anemia stabilized, thrombocytopenia improving, oncology have signed off.  Left foot cellulitis has resolved, completes oral antibiotics 12/9.  [facility] consulted for symptomatic choledocholithiasis, underwent ERCP 12/7 with intervention.  Patient stable for DC but states that he cannot leave until tomorrow due to lack of support at home.  Assessment/Plan: Principal Problem:   Antineoplastic chemotherapy induced pancytopenia (HCC) Active Problems:   Essential hypertension   Chronic pain   Anxiety   Right lower lobe lung mass   Thrombocytopenia (HCC)   COPD (chronic obstructive pulmonary disease) (HCC)   Pancytopenia (HCC)   Pancytopenia due to antineoplastic chemotherapy (HCC)   Choledocholithiasis   Gastritis and gastroduodenitis  Chemotherapy-induced severe pancytopenia Admission CBC on 11/30: WBC 1.3, ANC 0.7, hemoglobin 7.7 and platelet count <5. Pancytopenia due to adverse reaction of a chemotherapeutic agent used to provide palliative chemotherapy for his stage IV (T3,N2,M1c) NSCLCA with left adrenal metastasis and now malignant thoracic lymphadenopathy.   Received Alimta 500 mg/M2, Carboplatin, and Keytruda.  It is thought that the cytopenia is due to the [place]. The plan  going forward will be to discontinue this agent.  He received 2 units PRBCs transfusion on 01/27/2021 for hemoglobin of 6.8K.  He also received 2 pheresis packs of platelets.   Management per medical oncology-have since signed off Hemoglobin stable in the 8 g range for several days, thrombocytopenia gradually improving/138.  Leukopenia resolved.  Symptomatic choledocholithiasis with abnormal LFTs  AST and ALT peaked at 67/50 respectively.  Total bilirubin and alkaline phosphatase normal. [facility] consulted and s/p ERCP 12/7, complete removal of stones by biliary sphincterotomy and balloon extraction. GI follow-up appreciated.  Doing well.  Advancing to regular diet.  Recommend holding Brilinta until tomorrow.  LFTs stable.  GI signed off.  Gastritis and duodenitis Noted on ERCP 12/7.  Biopsied. Follow-up biopsy results. Per GI, no need to follow-up.  Hypokalemia/hypomagnesemia/hyperkalemia Hyperkalemia likely due to aggressive replacement, mild.  Discontinued potassium supplements.  Follow BMP in AM.  Left foot cellulitis, POA MRSA screening negative Blood cultures negative to date Received Rocephin and IV vancomycin Keflex started on 02/05/2021> 02/12/2021. Cellulitis is clinically resolved.  Resolved AKI, suspect prerenal in the setting of dehydration. Baseline creatinine appears to be 1.0 with GFR greater than 60. Presented with creatinine of 2.21 Creatinine has plateaued at 1.2.  Creatinine has gone up again from 1.2-1.37.  Brief IV fluids today and follow-up BMP in AM. Continue to avoid nephrotoxic agents, dehydration and hypotension.  Right lower lobe lung mass NSCLCA: stage IV (T3,N2,M1c) with malignant lymphadenopathy Outpatient follow-up with oncology.   Chronic anxiety/depression Continue home regimen  COPD (chronic obstructive pulmonary disease) (HCC) Stable. Albuterol nebs will be made available on a prn basis.   Chronic pain Cancer related pain.  Continue pain  management Continue bowel regimen.  CODE STATUS: Full Code DVT Prophylaxis: None due to severe thrombocytopenia and symptomatic anemia requiring blood transfusion.  SCDs. Family Communication: None at bedside. From: Home Disposition: Home    Disposition Plan: Likely DC home 12/9.   Consultants: Medical oncology GI  Procedures: S/p ERCP on 02/11/2019.  Antimicrobials: Rocephin IV vancomycin Keflex started on 02/05/2021.   Status is: Inpatient  Inpatient status.  Patient requires at least 2 midnights for further evaluation and treatment of present condition.   Subjective: Seen this morning.  Reports chronic right lower quadrant abdominal pain.  Passing flatus.  No BMs.  States that he has been up to the bathroom without difficulty.  Indicates that his sister has just come and left and he cannot go home today because they would not be anybody to pick him up or care for him at home.  His sister assists him and she is out running errands.   Objective: Vitals:   02/10/21 2212 02/11/21 0452 02/11/21 1050 02/11/21 1322  BP: 106/61 107/65 110/73 108/62  Pulse: 75 77 82 95  Resp:  19  18  Temp:  97.9 F (36.6 C)  97.7 F (36.5 C)  TempSrc:    Oral  SpO2:  91%  92%  Weight:      Height:        Intake/Output Summary (Last 24 hours) at 02/11/2021 1830 Last data filed at 02/11/2021 1500 Gross per 24 hour  Intake 140.86 ml  Output --  Net 140.86 ml   Filed Weights   02/03/21 1255  Weight: 103.9 kg    Exam:  General: 65 y.o. year-old male well-developed well-nourished in no acute distress.  He is alert and oriented x3. Cardiovascular: S1 and S2 heard, RRR.  No JVD, murmurs or pedal edema.  Telemetry personally reviewed: Sinus rhythm. Respiratory: Clear to auscultation.  No increased work of breathing.  Right upper anterior chest Port-A-Cath in place. Abdomen: Nondistended, soft.  Mild right mid/right lower quadrant tenderness without guarding or peritoneal signs.   Normal bowel sounds Musculoskeletal: No lower extremity edema bilaterally.   Skin: Left foot dorsal cellulitis findings have clinically resolved. psychiatry: Mood is appropriate for condition and setting.  Data Reviewed: CBC: Recent Labs  Lab 02/05/21 0550 02/06/21 0302 02/07/21 0310 02/08/21 0309 02/08/21 1528 02/09/21 0532 02/10/21 0253 02/11/21 0422  WBC 12.2* 15.0* 9.5 3.6* 3.9* 3.4* 3.1* 5.1  NEUTROABS 10.4* 12.6* 7.1 2.0  --   --   --   --   HGB 9.2* 8.5* 8.7* 8.2* 8.9* 8.8* 8.4* 8.5*  HCT 26.6* 25.7* 26.9* 25.3* 27.7* 27.4* 25.8* 26.1*  MCV 89.3 93.1 94.1 95.1 94.2 94.8 96.3 94.2  PLT 31* 38* 46* 53* 65* 78* 92* 650*   Basic Metabolic Panel: Recent Labs  Lab 02/05/21 0550 02/06/21 0302 02/07/21 0310 02/08/21 0309 02/08/21 1528 02/09/21 0532 02/10/21 0253 02/11/21 0422  NA 137 140 138 140 141 140 142 135  K 2.6* 3.2* 3.9 4.3 4.5 4.4 4.4 5.2*  CL 101 106 105 105 100 103 104 100  CO2 25 28 28 31 29 30 31 28   GLUCOSE 102* 111* 105* 105* 102* 103* 96 141*  BUN 22 16 18 21 19 21 21 23   CREATININE 1.49* 1.30* 1.35* 1.36* 1.35* 1.24 1.20 1.37*  CALCIUM 7.9* 8.0* 8.3* 8.6* 9.2 9.2 8.8* 8.8*  MG 1.1* 2.0 1.7 1.7  --   --   --   --   PHOS 2.5  --   --  3.8  --   --   --   --  GFR: Estimated Creatinine Clearance: 62.8 mL/min (A) (by C-G formula based on SCr of 1.37 mg/dL (H)). Liver Function Tests: Recent Labs  Lab 02/08/21 0309 02/08/21 1528 02/09/21 0532 02/10/21 0253 02/11/21 0422  AST 64* 66* 63* 67* 70*  ALT 41 43 47* 50* 54*  ALKPHOS 67 70 68 59 61  BILITOT 0.7 0.5 0.6 0.6 1.0  PROT 6.0* 6.5 6.3* 6.3* 6.4*  ALBUMIN 2.9* 3.2* 3.0* 2.9* 3.1*    Coagulation Profile: No results for input(s): INR, PROTIME in the last 168 hours.  Recent Results (from the past 240 hour(s))  Culture, blood (x 2)     Status: None   Collection Time: 02/03/21  9:06 PM   Specimen: BLOOD  Result Value Ref Range Status   Specimen Description   Final    BLOOD BLOOD RIGHT  HAND Performed at [facility] [address] [place], [place] [address]    Special Requests   Final    BOTTLES DRAWN AEROBIC AND ANAEROBIC Blood Culture adequate volume Performed at [facility] [address], [place], [place] [address]    Culture   Final    NO GROWTH 5 DAYS Performed at [facility], [place] [address], [place], [place] [address]    Report Status 02/09/2021 FINAL  Final  Culture, blood (x 2)     Status: None   Collection Time: 02/03/21  9:06 PM   Specimen: BLOOD  Result Value Ref Range Status   Specimen Description   Final    BLOOD BLOOD LEFT FOREARM Performed at [facility] [address] [place], [place] [address]    Special Requests   Final    BOTTLES DRAWN AEROBIC AND ANAEROBIC Blood Culture adequate volume Performed at [facility] [address] [place], [place] [address]    Culture   Final    NO GROWTH 5 DAYS Performed at [facility], [place] [address] [place], [place] [address]    Report Status 02/09/2021 FINAL  Final  Resp Panel by RT-PCR (Flu A&B, Covid) Nasopharyngeal Swab     Status: None   Collection Time: 02/03/21 11:21 PM   Specimen: Nasopharyngeal Swab; Nasopharyngeal(NP) swabs in vial transport medium  Result Value Ref Range Status   SARS Coronavirus 2 by RT PCR NEGATIVE NEGATIVE Final    Comment: (NOTE) SARS-CoV-2 target nucleic acids are NOT DETECTED.  The SARS-CoV-2 RNA is generally detectable in upper respiratory specimens during the acute phase of infection. The lowest concentration of SARS-CoV-2 viral copies this assay can detect is 138 copies/mL. A negative result does not preclude SARS-Cov-2 infection and should not be used as the sole basis for treatment or other patient management decisions. A negative result may occur with  improper specimen collection/handling, submission of specimen other than nasopharyngeal swab, presence of viral  mutation(s) within the areas targeted by this assay, and inadequate number of viral copies(<138 copies/mL). A negative result must be combined with clinical observations, patient history, and epidemiological information. The expected result is Negative.  Fact Sheet for Patients:  EntrepreneurPulse.com.au  Fact Sheet for Healthcare Providers:  IncredibleEmployment.be  This test is no t yet approved or cleared by the [place] FDA and  has been authorized for detection and/or diagnosis of SARS-CoV-2 by FDA under an Emergency Use Authorization (EUA). This EUA will remain  in effect (meaning this test can be used) for the duration of the COVID-19 declaration under Section 564(b)(1) of the Act, 21  U.S.C.section 360bbb-3(b)(1), unless the authorization is terminated  or revoked sooner.       Influenza A by PCR NEGATIVE NEGATIVE Final   Influenza B by PCR NEGATIVE NEGATIVE Final    Comment: (NOTE) The Xpert Xpress SARS-CoV-2/FLU/RSV plus assay is intended as an aid in the diagnosis of influenza from Nasopharyngeal swab specimens and should not be used as a sole basis for treatment. Nasal washings and aspirates are unacceptable for Xpert Xpress SARS-CoV-2/FLU/RSV testing.  Fact Sheet for Patients: EntrepreneurPulse.com.au  Fact Sheet for Healthcare Providers: IncredibleEmployment.be  This test is not yet approved or cleared by the [place] FDA and has been authorized for detection and/or diagnosis of SARS-CoV-2 by FDA under an Emergency Use Authorization (EUA). This EUA will remain in effect (meaning this test can be used) for the duration of the COVID-19 declaration under Section 564(b)(1) of the Act, 21 U.S.C. section 360bbb-3(b)(1), unless the authorization is terminated or revoked.  Performed at [facility], [place] [address], [place], [place] [address]   MRSA Next Gen by PCR,  Nasal     Status: None   Collection Time: 02/04/21  4:37 PM   Specimen: Nasal Mucosa; Nasal Swab  Result Value Ref Range Status   MRSA by PCR Next Gen NOT DETECTED NOT DETECTED Final    Comment: (NOTE) The GeneXpert MRSA Assay (FDA approved for NASAL specimens only), is one component of a comprehensive MRSA colonization surveillance program. It is not intended to diagnose MRSA infection nor to guide or monitor treatment for MRSA infections. Test performance is not FDA approved in patients less than 88 years old. Performed at [facility], [place] [address], [place], [place] [address]   Gastrointestinal Panel by PCR , Stool     Status: None   Collection Time: 02/05/21  3:39 PM   Specimen: Stool  Result Value Ref Range Status   Campylobacter species NOT DETECTED NOT DETECTED Final   Plesimonas shigelloides NOT DETECTED NOT DETECTED Final   Salmonella species NOT DETECTED NOT DETECTED Final   Yersinia enterocolitica NOT DETECTED NOT DETECTED Final   Vibrio species NOT DETECTED NOT DETECTED Final   Vibrio cholerae NOT DETECTED NOT DETECTED Final   Enteroaggregative E coli (EAEC) NOT DETECTED NOT DETECTED Final   Enteropathogenic E coli (EPEC) NOT DETECTED NOT DETECTED Final   Enterotoxigenic E coli (ETEC) NOT DETECTED NOT DETECTED Final   Shiga like toxin producing E coli (STEC) NOT DETECTED NOT DETECTED Final   Shigella/Enteroinvasive E coli (EIEC) NOT DETECTED NOT DETECTED Final   Cryptosporidium NOT DETECTED NOT DETECTED Final   Cyclospora cayetanensis NOT DETECTED NOT DETECTED Final   Entamoeba histolytica NOT DETECTED NOT DETECTED Final   Giardia lamblia NOT DETECTED NOT DETECTED Final   Adenovirus F40/41 NOT DETECTED NOT DETECTED Final   Astrovirus NOT DETECTED NOT DETECTED Final   Norovirus GI/GII NOT DETECTED NOT DETECTED Final   Rotavirus A NOT DETECTED NOT DETECTED Final   Sapovirus (I, II, IV, and V) NOT DETECTED NOT DETECTED Final    Comment: Performed at  [facility], [place]., [place], [place] [address]  C Difficile Quick Screen w PCR reflex     Status: None   Collection Time: 02/05/21  3:39 PM  Result Value Ref Range Status   C Diff antigen NEGATIVE NEGATIVE Final   C Diff toxin NEGATIVE NEGATIVE Final   C Diff interpretation No C. difficile detected.  Final    Comment: Performed at [facility],  [place] [address] [place], [place] [address]      Studies: No results found.  Scheduled Meds:  allopurinol  200 mg Oral Daily   cephALEXin  500 mg Oral Q6H   Chlorhexidine Gluconate Cloth  6 each Topical Daily   feeding supplement  237 mL Oral BID BM   folic acid  1 mg Oral Daily   metoprolol tartrate  25 mg Oral BID   morphine  60 mg Oral Q12H   multivitamin with minerals  1 tablet Oral Daily   pantoprazole  40 mg Oral Daily   rosuvastatin  20 mg Oral Daily    Continuous Infusions:  sodium chloride 50 mL/hr at 02/11/21 1210      LOS: 8 days     Vernell Leep, MD,  FACP, [facility], [facility], [facility] (Care Management Physician Certified) [place]  To contact the attending provider between 7A-7P or the covering provider during after hours 7P-7A, please log into the web site [email or web address] and access using universal [place] password for that web site. If you do not have the password, please call the hospital operator.

## 2021-02-11 NOTE — Progress Notes (Addendum)
Progress Note   Subjective  Chief Complaint: Right upper quadrant pain, choledocholithiasis and stage IV non-small cell lung cancer, status post ERCP 02/10/2021  This morning patient tells me that he continues to do well.  He has remained on a clear liquid diet overnight.  Denies any new nausea or vomiting.  Continues with his chronic right sided abdominal pain.  Also tells [place] that he is passing a lot of flatus and asks [place] if this is normal.   Objective   Vital signs in last 24 hours: Temp:  [97.4 F (36.3 C)-98.1 F (36.7 C)] 97.9 F (36.6 C) (12/08 0452) Pulse Rate:  [67-78] 77 (12/08 0452) Resp:  [9-19] 19 (12/08 0452) BP: (97-131)/(55-81) 107/65 (12/08 0452) SpO2:  [90 %-97 %] 91 % (12/08 0452) Last BM Date: 02/09/21 General:    white male in NAD Heart:  Regular rate and rhythm; no murmurs Lungs: Respirations even and unlabored, lungs CTA bilaterally Abdomen:  Soft, moderate right-sided TTP and mild epigastric TTP and nondistended. Normal bowel sounds. Psych:  Cooperative. Normal mood and affect.  Intake/Output from previous day: 12/07 0701 - 12/08 0700 In: 500 [I.V.:500] Out: 0   Lab Results: Recent Labs    02/09/21 0532 02/10/21 0253 02/11/21 0422  WBC 3.4* 3.1* 5.1  HGB 8.8* 8.4* 8.5*  HCT 27.4* 25.8* 26.1*  PLT 78* 92* 138*   BMET Recent Labs    02/09/21 0532 02/10/21 0253 02/11/21 0422  NA 140 142 135  K 4.4 4.4 5.2*  CL 103 104 100  CO2 _0 GLUCOSE 103* 96 141*  BUN _1 CREATININE 1.24 1.20 1.37*  CALCIUM 9.2 8.8* 8.8*   Hepatic Function Latest Ref Rng & Units 02/11/2021 02/10/2021 02/09/2021  Total Protein 6.5 - 8.1 g/dL 6.4(L) 6.3(L) 6.3(L)  Albumin 3.5 - 5.0 g/dL 3.1(L) 2.9(L) 3.0(L)  AST 15 - 41 U/L 70(H) 67(H) 63(H)  ALT 0 - 44 U/L 54(H) 50(H) 47(H)  Alk Phosphatase 38 - 126 U/L 61 59 68  Total Bilirubin 0.3 - 1.2 mg/dL 1.0 0.6 0.6  Bilirubin, Direct 0.0 - 0.2 mg/dL - - -    Studies/Results: DG ERCP WITH  SPHINCTEROTOMY  Result Date: 02/10/2021 CLINICAL DATA:  65 year old male with history of common bile duct obstruction. EXAM: ERCP TECHNIQUE: Multiple spot images obtained with the fluoroscopic device and submitted for interpretation post-procedure. FLUOROSCOPY TIME:  Fluoroscopy Time:  7 minutes, 2 seconds Radiation Exposure Index (if provided by the fluoroscopic device): Not provided. Number of Acquired Spot Images: 5 COMPARISON:  CT abdomen pelvis from 02/05/2021 FINDINGS: Duodenal scope is positioned near the second portion the duodenum. Retrograde cannulation of the common bile duct is performed and cholangiogram demonstrates multiple rounded filling defects in the common bile duct compatible with findings on comparison CT. Balloon sweep and sphincterotomy is performed with clearance of the filling defects. IMPRESSION: Choledocholithiasis with balloon sweep and sphincterotomy. These images were submitted for radiologic interpretation only. Please see the procedural report for full procedural details and the amount of contrast and the fluoroscopy time utilized. Electronically Signed   By: Ruthann Cancer M.D.   On: 02/10/2021 14:30   DG C-Arm 1-60 Min-No Report  Result Date: 02/10/2021 Fluoroscopy was utilized by the requesting physician.  No radiographic interpretation.      Assessment / Plan:    Assessment: 1.  Choledocholithiasis: Status post ERCP 02/10/2021 with balloon sweep and sphincterotomy, patient doing well this morning, LFTs stable 2.  Stage IV non-small  cell lung cancer 3.  Thrombocytopenia and leukopenia: Improved  Plan: 1.  Patient status post ERCP yesterday with Dr. Carlean Purl with balloon sweep and sphincterotomy, doing well this morning with no increase in his chronic right-sided abdominal pain. 2.  Changed diet orders from clear liquids to regular diet this morning and encouraged the patient to eat. 3.  Continue to monitor LFTs while patient is here  We will sign off.  Thank you  for this kind consult.   LOS: 8 days   Levin Erp  02/11/2021, 9:46 AM  I have taken a history, reviewed the chart and examined the patient. I performed a substantive portion of this encounter, including complete performance of at least one of the key components, in conjunction with the APP. I agree with the APP's note, impression and recommendations  65 year old male with stage IV lung CA, admitted with medication-induced severe thrombocytopenia,  incidentally found to have choledocholithiasis on surveillance CT, underwent successful ERCP with sphincterotomy yesterday, doing well today with no new symptoms other than feeling a bit bloated and gassy.  Reassured patient that sometimes air introduced during the ERCP can persist for a day or two following the procedure.  Liver enzymes normal.  Advancing diet to regular today.  Patient can be discharged home from GI standpoint without need for follow up as long as not having any concerning GI symptoms.   Would recommend holding his Brillinta until at least tomorrow to reduce risk of sphincterotomy bleed.  Yasser Hepp E. Candis Schatz, MD [facility]

## 2021-02-12 ENCOUNTER — Inpatient Hospital Stay (HOSPITAL_COMMUNITY): Payer: PPO

## 2021-02-12 DIAGNOSIS — R748 Abnormal levels of other serum enzymes: Secondary | ICD-10-CM

## 2021-02-12 LAB — CBC
HCT: 24.2 % — ABNORMAL LOW (ref 39.0–52.0)
HCT: 26.5 % — ABNORMAL LOW (ref 39.0–52.0)
Hemoglobin: 7.9 g/dL — ABNORMAL LOW (ref 13.0–17.0)
Hemoglobin: 8.5 g/dL — ABNORMAL LOW (ref 13.0–17.0)
MCH: 31.3 pg (ref 26.0–34.0)
MCH: 31 pg (ref 26.0–34.0)
MCHC: 32.6 g/dL (ref 30.0–36.0)
MCHC: 32.1 g/dL (ref 30.0–36.0)
MCV: 96 fL (ref 80.0–100.0)
MCV: 96.7 fL (ref 80.0–100.0)
Platelets: 142 10*3/uL — ABNORMAL LOW (ref 150–400)
Platelets: 163 10*3/uL (ref 150–400)
RBC: 2.52 MIL/uL — ABNORMAL LOW (ref 4.22–5.81)
RBC: 2.74 MIL/uL — ABNORMAL LOW (ref 4.22–5.81)
RDW: 15.4 % (ref 11.5–15.5)
RDW: 15.9 % — ABNORMAL HIGH (ref 11.5–15.5)
WBC: 4.5 10*3/uL (ref 4.0–10.5)
WBC: 3.8 10*3/uL — ABNORMAL LOW (ref 4.0–10.5)
nRBC: 0 % (ref 0.0–0.2)
nRBC: 0 % (ref 0.0–0.2)

## 2021-02-12 LAB — COMPREHENSIVE METABOLIC PANEL
ALT: 149 U/L — ABNORMAL HIGH (ref 0–44)
AST: 214 U/L — ABNORMAL HIGH (ref 15–41)
Albumin: 3 g/dL — ABNORMAL LOW (ref 3.5–5.0)
Alkaline Phosphatase: 102 U/L (ref 38–126)
Anion gap: 7 (ref 5–15)
BUN: 24 mg/dL — ABNORMAL HIGH (ref 8–23)
CO2: 31 mmol/L (ref 22–32)
Calcium: 8.5 mg/dL — ABNORMAL LOW (ref 8.9–10.3)
Chloride: 102 mmol/L (ref 98–111)
Creatinine, Ser: 1.37 mg/dL — ABNORMAL HIGH (ref 0.61–1.24)
GFR, Estimated: 57 mL/min — ABNORMAL LOW (ref >60)
Glucose, Bld: 111 mg/dL — ABNORMAL HIGH (ref 70–99)
Potassium: 4 mmol/L (ref 3.5–5.1)
Sodium: 140 mmol/L (ref 135–145)
Total Bilirubin: 0.5 mg/dL (ref 0.3–1.2)
Total Protein: 6.2 g/dL — ABNORMAL LOW (ref 6.5–8.1)

## 2021-02-12 IMAGING — MR MR ABDOMEN WO/W CM MRCP
17 of 19 series · 40 of 48 positions shown · IV contrast (10 GADAVIST)
Comparison: CT abdomen/pelvis dated [DATE]

CLINICAL DATA: Choledocholithiasis, status post ERCP with balloon
sweep and sphincterotomy

EXAM:
MRI ABDOMEN WITHOUT AND WITH CONTRAST (INCLUDING MRCP)
TECHNIQUE: Multiplanar multisequence MR imaging of the abdomen was performed
both before and after the administration of intravenous contrast.
Heavily T2-weighted images of the biliary and pancreatic ducts were
obtained, and three-dimensional MRCP images were rendered by post
processing.
CONTRAST:  10mL GADAVIST GADOBUTROL 1 MMOL/ML IV SOLN

[Series 6: T2 fat-sat · axial · 6.0mm · 1.25mm/px · z∈[-88,+179]mm · 2 of 38 slices shown]
[im 1/38]
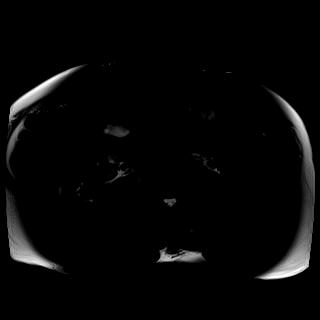
[im 38/38]
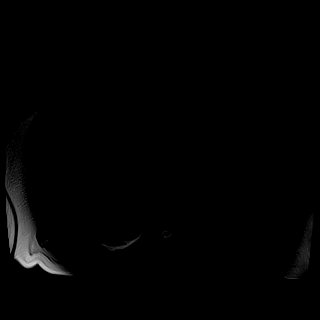

[Series 8: cor obl thk · sagittal · 50.0mm · 0.78mm/px · 1 of 9 slices shown]
[im 1/9]
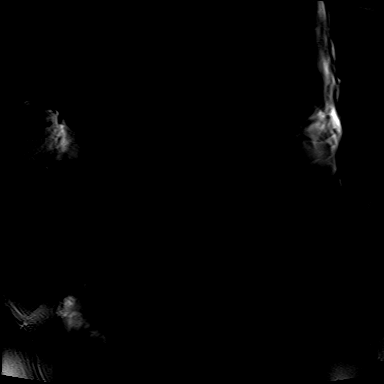

[Series 9: T2 · coronal · 6.0mm · 1.56mm/px · 1 of 30 slices shown (1 of 2)]
[im 1/30]
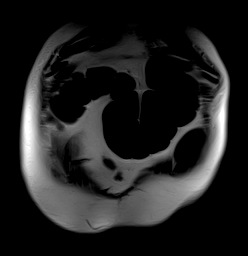

[Series 10: T1 · axial · 3.0mm · 1.31mm/px · z∈[-60,+153]mm · 3 of 72 slices shown (1 of 2)]
[im 1/72]
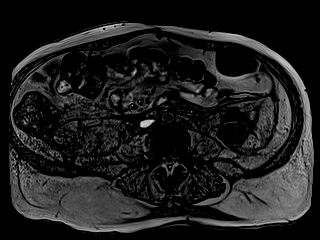
[im 36/72]
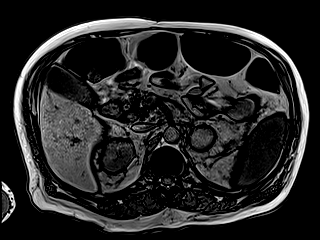
[im 72/72]
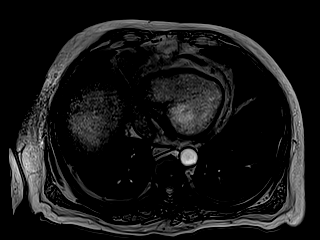

[Series 11: T1 · axial · 3.0mm · 1.31mm/px · z∈[-60,+153]mm · 3 of 72 slices shown (2 of 2)]
[im 1/72]
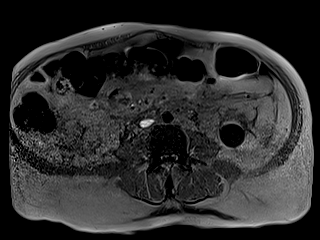
[im 36/72]
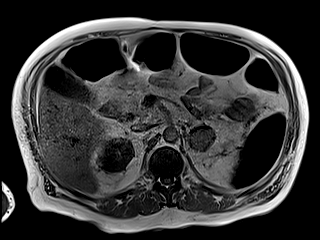
[im 72/72]
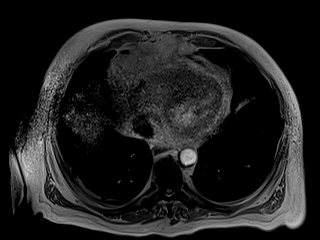

[Series 12: DWI · axial · 6.0mm · 1.49mm/px · z∈[-109,+164]mm · 3 of 78 slices shown (1 of 2)]
[im 1/78]
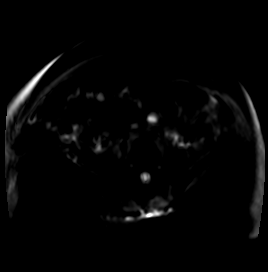
[im 39/78]
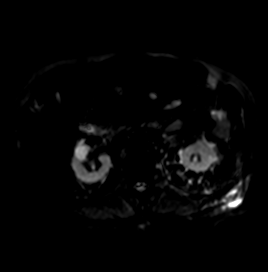
[im 78/78]
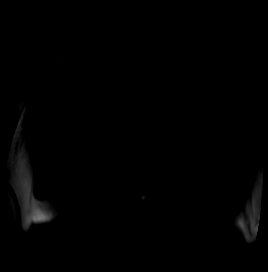

[Series 13: DWI · axial · 6.0mm · 1.49mm/px · 1 of 39 slices shown (2 of 2)]
[im 1/39]
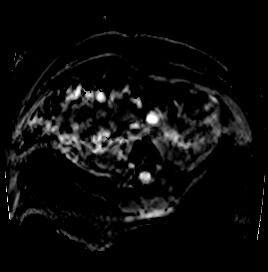

[Series 17: cor_3d_spc_trig · coronal · 1.0mm · 0.49mm/px · 3 of 72 slices shown]
[im 1/72]
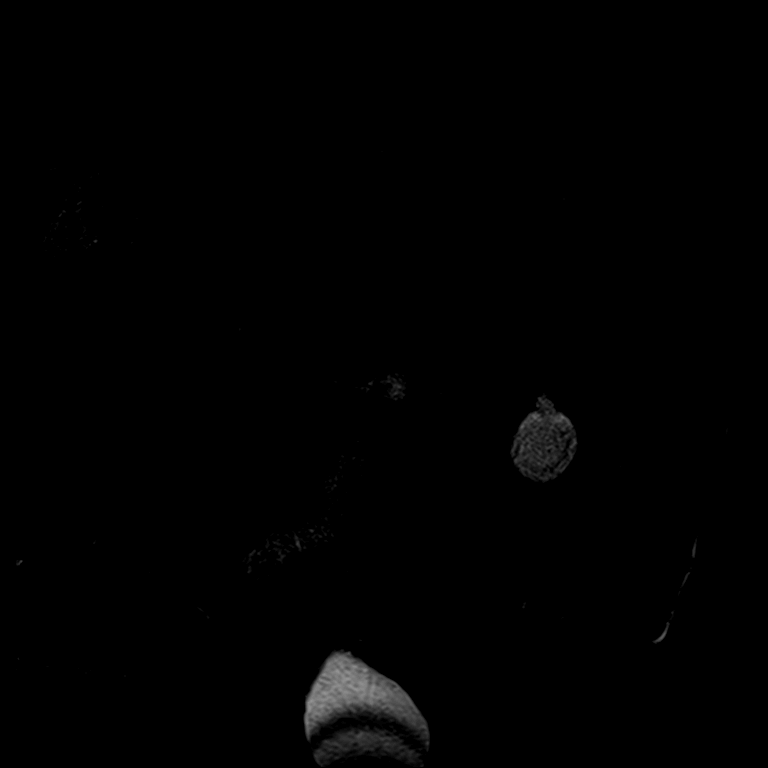
[im 36/72]
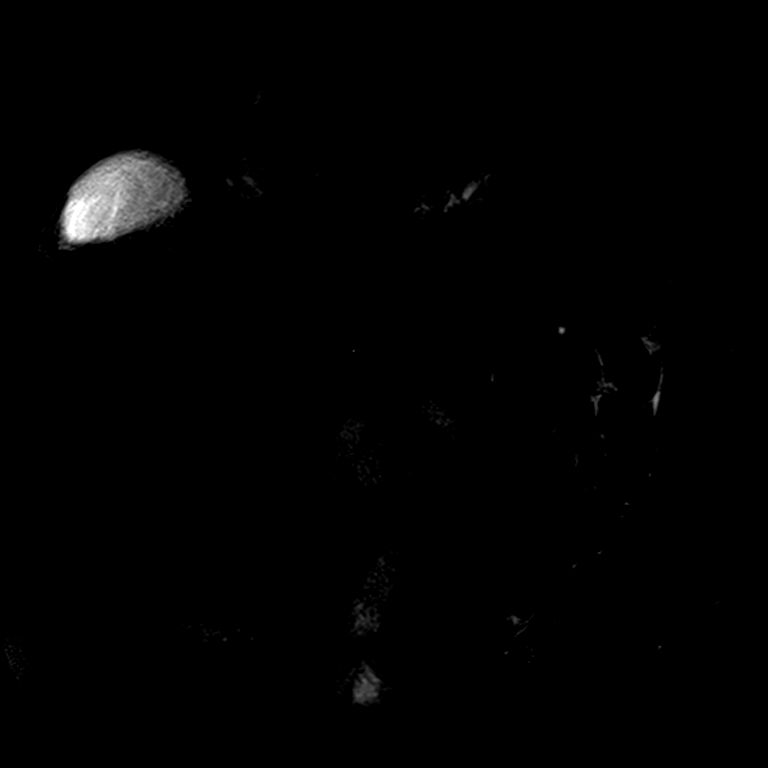
[im 72/72]
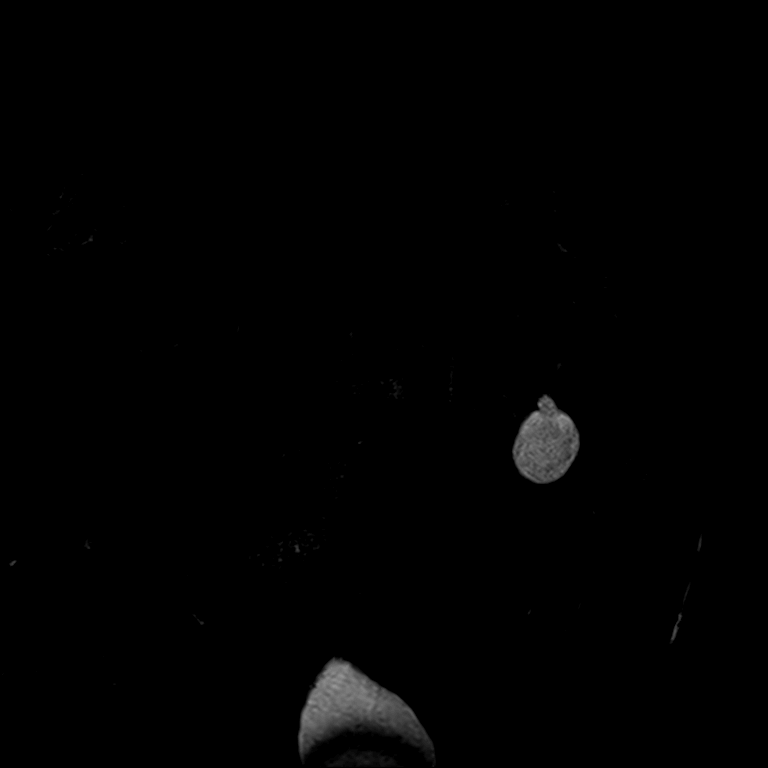

[Series 19: T2 · axial · 6.0mm · 1.64mm/px · 1 of 37 slices shown (2 of 2)]
[im 1/37]
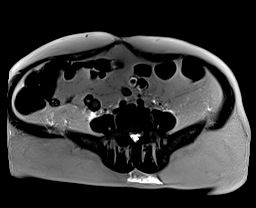

[Series 21: T1 dynamic · axial · 3.0mm · 1.31mm/px · z∈[-92,+169]mm · 3 of 88 slices shown (1 of 8)]
[im 1/88]
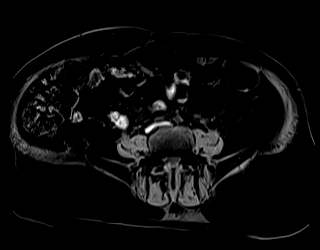
[im 44/88]
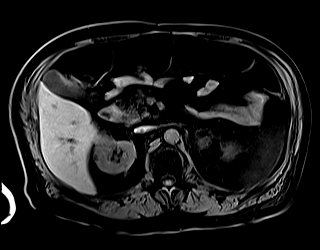
[im 88/88]
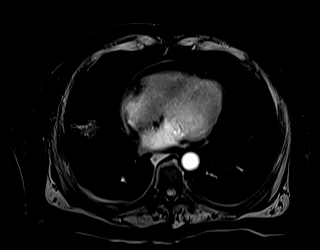

[Series 25: T1 dynamic · axial · 3.0mm · 1.31mm/px · z∈[-92,+169]mm · 3 of 88 slices shown (2 of 8)]
[im 1/88]
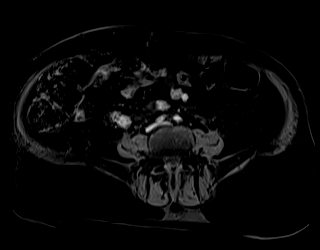
[im 44/88]
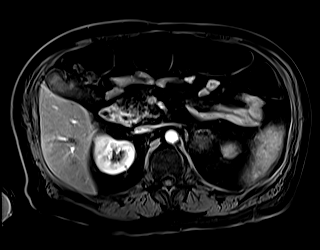
[im 88/88]
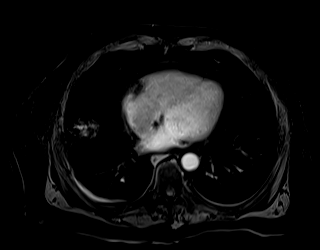

[Series 26: T1 dynamic · axial · 3.0mm · 1.31mm/px · z∈[-92,+169]mm · 3 of 88 slices shown (3 of 8)]
[im 1/88]
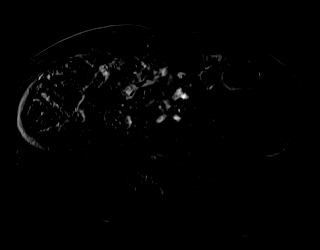
[im 44/88]
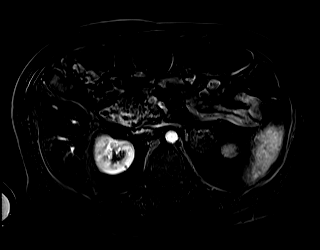
[im 88/88]
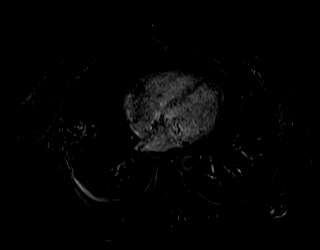

[Series 29: T1 dynamic · axial · 3.0mm · 1.31mm/px · z∈[-92,+169]mm · 3 of 88 slices shown (4 of 8)]
[im 1/88]
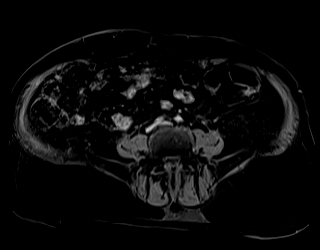
[im 44/88]
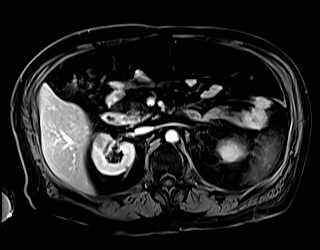
[im 88/88]
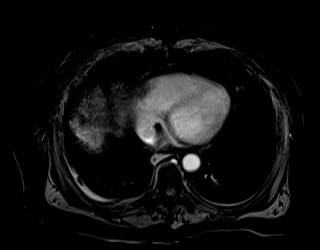

[Series 30: T1 dynamic · axial · 3.0mm · 1.31mm/px · z∈[-92,+169]mm · 3 of 88 slices shown (5 of 8)]
[im 1/88]
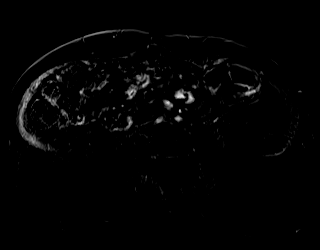
[im 44/88]
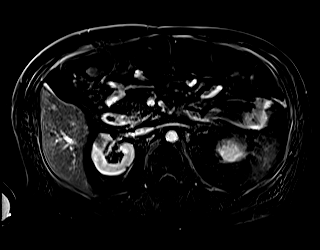
[im 88/88]
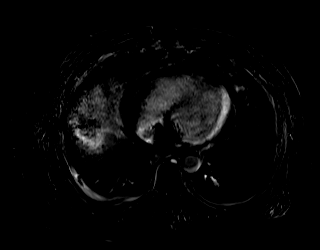

[Series 34: T1 dynamic · axial · 3.0mm · 1.31mm/px · z∈[-92,+169]mm · 3 of 88 slices shown (6 of 8)]
[im 1/88]
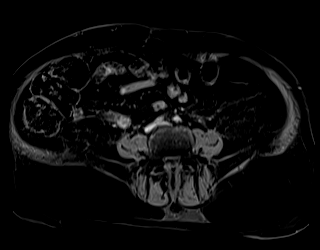
[im 44/88]
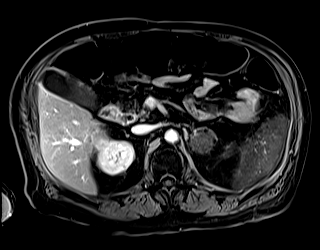
[im 88/88]
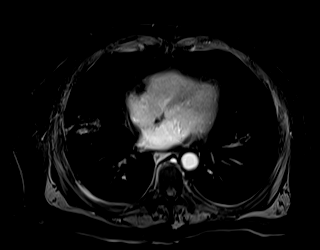

[Series 35: T1 dynamic · axial · 3.0mm · 1.31mm/px · z∈[-92,+169]mm · 3 of 88 slices shown (7 of 8)]
[im 1/88]
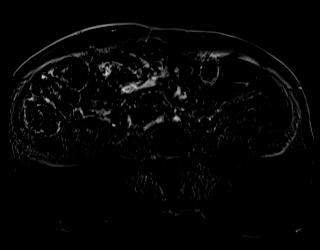
[im 44/88]
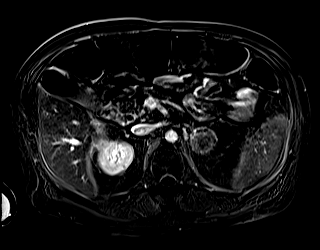
[im 88/88]
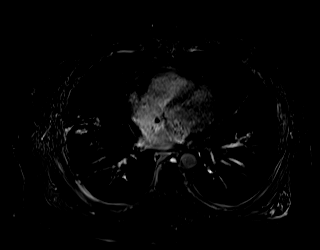

[Series 37: T1 dynamic · coronal · 3.0mm · 1.41mm/px · 1 of 72 slices shown (8 of 8)]
[im 1/72]
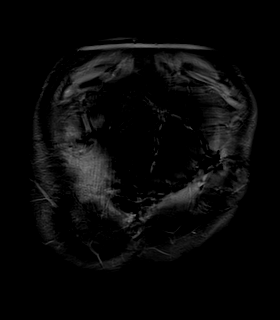

[40 of 48 positions shown; findings below may reference images not displayed]

FINDINGS: Lower chest: Lung bases are clear.

Hepatobiliary: Mild hepatic steatosis. No suspicious/enhancing
hepatic lesions.

Layering small gallstones (series 19/image 13), with mild
gallbladder distension. No gallbladder wall thickening or
pericholecystic fluid.

No intrahepatic ductal dilatation. Common duct measures 7 mm (series
9/image 13). At least two mid/distal CBD stones measuring up to 5 mm
(series 9/image 14).

Pancreas:  Within normal limits.

Spleen:  Within normal limits.

Adrenals/Urinary Tract: 4.0 x 3.6 cm centrally necrotic left adrenal
mass (series 19/image 16), decreased from priors, favoring improving
metastasis. Right adrenal gland is within normal limits.

Bilateral renal cysts, measuring up to 3.3 cm in the left lower
kidney. No hydronephrosis.

Stomach/Bowel: Stomach is within normal limits.

Visualized bowel is grossly unremarkable.

Vascular/Lymphatic:  No evidence of abdominal aortic aneurysm.

No suspicious abdominal lymphadenopathy.

Other:  No abdominal ascites.

Musculoskeletal: No focal osseous lesions.
IMPRESSION: Choledocholithiasis with at least two mid/distal CBD stones
measuring up to 5 mm.

Cholelithiasis, without associated inflammatory changes to suggest
acute cholecystitis.

4.0 cm centrally necrotic left adrenal mass, decreased from priors,
favoring improving metastasis.

Additional ancillary findings as above.

## 2021-02-12 MED ORDER — GADOBUTROL 1 MMOL/ML IV SOLN
10.0000 mL | Freq: Once | INTRAVENOUS | Status: AC | PRN
  Administered 2021-02-12: 10 mL via INTRAVENOUS

## 2021-02-12 MED ORDER — DOCUSATE SODIUM 100 MG PO CAPS
100.0000 mg | ORAL_CAPSULE | Freq: Two times a day (BID) | ORAL | Status: DC
  Administered 2021-02-12 – 2021-02-13 (×2): 100 mg via ORAL
  Filled 2021-02-12 (×2): qty 1

## 2021-02-12 NOTE — Progress Notes (Signed)
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  He was advised to go to the ED for further evaluation and management.  He received 2 units PRBCs for hemoglobin of 6.8K and 2 units of platelets for platelet count less than 5000.  Anemia stabilized, thrombocytopenia improving, oncology have signed off.  Left foot cellulitis has resolved, completes oral antibiotics 12/9.  [facility] consulted for symptomatic choledocholithiasis, underwent ERCP 12/7 with intervention.  Postprocedure LFTs rising 12/9, GI back on board, getting MRCP to exclude retained stones in CBD.  Not yet medically ready for DC.  Assessment/Plan: Principal Problem:   Antineoplastic chemotherapy induced pancytopenia (HCC) Active Problems:   Essential hypertension   Chronic pain   Anxiety   Right lower lobe lung mass   Thrombocytopenia (HCC)   COPD (chronic obstructive pulmonary disease) (HCC)   Pancytopenia (HCC)   Pancytopenia due to antineoplastic chemotherapy (HCC)   Choledocholithiasis   Gastritis and gastroduodenitis  Chemotherapy-induced severe pancytopenia Admission CBC on 11/30: WBC 1.3, ANC 0.7, hemoglobin 7.7 and platelet count <5. Pancytopenia due to adverse reaction of a chemotherapeutic agent used to provide palliative chemotherapy for his stage IV (T3,N2,M1c) NSCLCA with left adrenal metastasis and now malignant thoracic lymphadenopathy.   Received Alimta 500 mg/M2, Carboplatin, and Keytruda.  It is thought that the cytopenia is due  to the [place]. The plan going forward will be to discontinue this agent.  He received 2 units PRBCs transfusion on 01/27/2021 for hemoglobin of 6.8K.  He also received 2 pheresis packs of platelets.   Management per medical oncology-have since signed off Hemoglobin stable in the 8 g range for several days, thrombocytopenia resolved.  Mild leukopenia.  Symptomatic choledocholithiasis with abnormal LFTs  AST and ALT peaked at 67/50 respectively.  Total bilirubin and alkaline phosphatase normal. [facility] consulted and s/p ERCP 12/7, complete removal of stones by biliary sphincterotomy and balloon extraction. Patient was doing well.  GI had signed off.  However his AST and ALT have almost tripled since yesterday.  Requested GI to follow-up in their input appreciated. GI recommend patient stay overnight for continued LFT monitoring, getting MRCP to exclude retained stones in the CBD. Await GI clearance to resume prior home Brilinta.  Gastritis and duodenitis Noted on ERCP 12/7.  Biopsied. Stomach biopsy results: Unremarkable antral mucosa.  [place]art[place]-starry negative for Helicobacter pylori Per GI, no need to follow-up.  Hypokalemia/hypomagnesemia/hyperkalemia Resolved.  Left foot cellulitis, POA MRSA screening negative Blood cultures negative to date Received Rocephin and IV vancomycin Keflex started on 02/05/2021> 02/12/2021. Cellulitis has clinically resolved.  Resolved AKI, suspect prerenal in the setting of dehydration. Baseline creatinine appears to be 1.0 with GFR greater than 60. Presented with creatinine of 2.21 Creatinine which had gone down to 1.2, up to 1.37 over the last 2 days but stable.  Clinically euvolemic.  No significant change despite brief and gentle IV fluids yesterday.  Follow BMP in AM. Continue to avoid nephrotoxic agents, dehydration and hypotension.  Right lower lobe lung mass NSCLCA: stage IV (T3,N2,M1c) with malignant lymphadenopathy Outpatient follow-up with  oncology.   Chronic anxiety/depression Continue home regimen  COPD (chronic obstructive pulmonary disease) (HCC) Stable.   Chronic pain Cancer related pain.  Continue pain management Continue bowel regimen.  Patient declines strong laxatives but willing to try stool softeners.  Initiated Colace.   CODE STATUS: Full Code DVT Prophylaxis: None due to severe thrombocytopenia and symptomatic anemia requiring blood transfusion.  SCDs. Family Communication: Sister at bedside. From: Home Disposition: Home    Disposition Plan: DC home pending further work-up of post ERCP elevation of LFTs and clearance by GI.   Consultants: Medical oncology GI  Procedures: S/p ERCP on 02/11/2019.  Antimicrobials: Rocephin IV vancomycin Keflex started on 02/05/2021.   Status is: Inpatient   Subjective: Reports very tiny BM after ERCP yesterday.  Does not want "strong laxatives" because he is tired of rushing to the bathroom or having to be cleaned up if he had any incontinent episodes.  Willing to try stool softeners.  Chronic right mid/lower abdominal pain, unchanged.  Had mild nausea this morning without vomiting.   Objective: Vitals:   02/11/21 2150 02/12/21 0547 02/12/21 0846 02/12/21 1213  BP: 102/74 100/61 110/78 113/63  Pulse:  70 88 68  Resp:  18  20  Temp:  97.9 F (36.6 C)  98 F (36.7 C)  TempSrc:    Oral  SpO2:  96%  96%  Weight:      Height:        Intake/Output Summary (Last 24 hours) at 02/12/2021 1517 Last data filed at 02/12/2021 0800 Gross per 24 hour  Intake 120 ml  Output 1 ml  Net 119 ml   Filed Weights   02/03/21 1255  Weight: 103.9 kg    Exam:  General: 65 y.o. year-old male well-developed well-nourished in no acute distress.  He is alert and oriented x3.  Oral mucosa moist. Cardiovascular: S1 and S2 heard, RRR.  No JVD, murmurs or pedal edema. Respiratory: Clear to auscultation.  No increased work of breathing.  Right upper anterior chest  Port-A-Cath in place. Abdomen: Nondistended, soft.  Mild right mid/right lower quadrant tenderness without guarding or peritoneal signs.  Normal bowel sounds.  Unchanged compared to yesterday. Musculoskeletal: No lower extremity edema bilaterally.   Skin: Left foot dorsal cellulitis findings have clinically resolved. psychiatry: Mood is appropriate for condition and setting.  Data Reviewed: CBC: Recent Labs  Lab 02/06/21 0302 02/07/21 0310 02/08/21 0309 02/08/21 1528 02/09/21 0532 02/10/21 0253 02/11/21 0422 02/12/21 0341 02/12/21 0900  WBC 15.0* 9.5 3.6*   < > 3.4* 3.1* 5.1 4.5 3.8*  NEUTROABS 12.6* 7.1 2.0  --   --   --   --   --   --   HGB 8.5* 8.7* 8.2*   < > 8.8* 8.4* 8.5* 7.9* 8.5*  HCT 25.7* 26.9* 25.3*   < > 27.4* 25.8* 26.1* 24.2* 26.5*  MCV 93.1 94.1 95.1   < > 94.8 96.3 94.2 96.0 96.7  PLT 38* 46* 53*   < > 78* 92* 138* 142* 163   < > = values in this interval not displayed.   Basic Metabolic Panel: Recent Labs  Lab 02/06/21 0302 02/07/21 0310 02/08/21 0309 02/08/21 1528 02/09/21 0532 02/10/21 0253 02/11/21 0422 02/12/21 0341  NA 140 138 140 141 140 142 135 140  K 3.2* 3.9 4.3 4.5 4.4 4.4 5.2* 4.0  CL 106 105 105 100 103 104 100 102  CO2 28 28 31 29 30 31 28 31   GLUCOSE 111* 105* 105* 102*  103* 96 141* 111*  BUN 16 18 21 19 21 21 23  24*  CREATININE 1.30* 1.35* 1.36* 1.35* 1.24 1.20 1.37* 1.37*  CALCIUM 8.0* 8.3* 8.6* 9.2 9.2 8.8* 8.8* 8.5*  MG 2.0 1.7 1.7  --   --   --   --   --   PHOS  --   --  3.8  --   --   --   --   --    GFR: Estimated Creatinine Clearance: 62.8 mL/min (A) (by C-G formula based on SCr of 1.37 mg/dL (H)). Liver Function Tests: Recent Labs  Lab 02/08/21 1528 02/09/21 0532 02/10/21 0253 02/11/21 0422 02/12/21 0341  AST 66* 63* 67* 70* 214*  ALT 43 47* 50* 54* 149*  ALKPHOS 70 68 59 61 102  BILITOT 0.5 0.6 0.6 1.0 0.5  PROT 6.5 6.3* 6.3* 6.4* 6.2*  ALBUMIN 3.2* 3.0* 2.9* 3.1* 3.0*    Coagulation Profile: No results for  input(s): INR, PROTIME in the last 168 hours.  Recent Results (from the past 240 hour(s))  Culture, blood (x 2)     Status: None   Collection Time: 02/03/21  9:06 PM   Specimen: BLOOD  Result Value Ref Range Status   Specimen Description   Final    BLOOD BLOOD RIGHT HAND Performed at [facility] [address] [place], [place] [address]    Special Requests   Final    BOTTLES DRAWN AEROBIC AND ANAEROBIC Blood Culture adequate volume Performed at [facility] [address] [place], [place] [address]    Culture   Final    NO GROWTH 5 DAYS Performed at [facility], [place] [address], [place], [place] [address]    Report Status 02/09/2021 FINAL  Final  Culture, blood (x 2)     Status: None   Collection Time: 02/03/21  9:06 PM   Specimen: BLOOD  Result Value Ref Range Status   Specimen Description   Final    BLOOD BLOOD LEFT FOREARM Performed at [facility] [address], [place], [place] [address]    Special Requests   Final    BOTTLES DRAWN AEROBIC AND ANAEROBIC Blood Culture adequate volume Performed at [facility] [address], [place], [place] [address]    Culture   Final    NO GROWTH 5 DAYS Performed at [facility], [place] [address] [place], [place] [address]    Report Status 02/09/2021 FINAL  Final  Resp Panel by RT-PCR (Flu A&B, Covid) Nasopharyngeal Swab     Status: None   Collection Time: 02/03/21 11:21 PM   Specimen: Nasopharyngeal Swab; Nasopharyngeal(NP) swabs in vial transport medium  Result Value Ref Range Status   SARS Coronavirus 2 by RT PCR NEGATIVE NEGATIVE Final    Comment: (NOTE) SARS-CoV-2 target nucleic acids are NOT DETECTED.  The SARS-CoV-2 RNA is generally detectable in upper respiratory specimens during the acute phase of infection. The lowest concentration of SARS-CoV-2 viral copies this assay can detect is 138 copies/mL. A negative result does not  preclude SARS-Cov-2 infection and should not be used as the sole basis for treatment or other patient management decisions. A negative result may occur with  improper specimen collection/handling, submission of specimen other than nasopharyngeal swab, presence of viral mutation(s) within the areas targeted by this assay, and inadequate number of viral copies(<138 copies/mL). A negative result must be combined with clinical observations, patient history, and epidemiological information. The  expected result is Negative.  Fact Sheet for Patients:  EntrepreneurPulse.com.au  Fact Sheet for Healthcare Providers:  IncredibleEmployment.be  This test is no t yet approved or cleared by the [place] FDA and  has been authorized for detection and/or diagnosis of SARS-CoV-2 by FDA under an Emergency Use Authorization (EUA). This EUA will remain  in effect (meaning this test can be used) for the duration of the COVID-19 declaration under Section 564(b)(1) of the Act, 21 U.S.C.section 360bbb-3(b)(1), unless the authorization is terminated  or revoked sooner.       Influenza A by PCR NEGATIVE NEGATIVE Final   Influenza B by PCR NEGATIVE NEGATIVE Final    Comment: (NOTE) The Xpert Xpress SARS-CoV-2/FLU/RSV plus assay is intended as an aid in the diagnosis of influenza from Nasopharyngeal swab specimens and should not be used as a sole basis for treatment. Nasal washings and aspirates are unacceptable for Xpert Xpress SARS-CoV-2/FLU/RSV testing.  Fact Sheet for Patients: EntrepreneurPulse.com.au  Fact Sheet for Healthcare Providers: IncredibleEmployment.be  This test is not yet approved or cleared by the [place] FDA and has been authorized for detection and/or diagnosis of SARS-CoV-2 by FDA under an Emergency Use Authorization (EUA). This EUA will remain in effect (meaning this test can be used) for the  duration of the COVID-19 declaration under Section 564(b)(1) of the Act, 21 U.S.C. section 360bbb-3(b)(1), unless the authorization is terminated or revoked.  Performed at [facility], [place] [address] [place], [place] [address]   MRSA Next Gen by PCR, Nasal     Status: None   Collection Time: 02/04/21  4:37 PM   Specimen: Nasal Mucosa; Nasal Swab  Result Value Ref Range Status   MRSA by PCR Next Gen NOT DETECTED NOT DETECTED Final    Comment: (NOTE) The GeneXpert MRSA Assay (FDA approved for NASAL specimens only), is one component of a comprehensive MRSA colonization surveillance program. It is not intended to diagnose MRSA infection nor to guide or monitor treatment for MRSA infections. Test performance is not FDA approved in patients less than 28 years old. Performed at [facility], [place] [address], [place], [place] [address]   Gastrointestinal Panel by PCR , Stool     Status: None   Collection Time: 02/05/21  3:39 PM   Specimen: Stool  Result Value Ref Range Status   Campylobacter species NOT DETECTED NOT DETECTED Final   Plesimonas shigelloides NOT DETECTED NOT DETECTED Final   Salmonella species NOT DETECTED NOT DETECTED Final   Yersinia enterocolitica NOT DETECTED NOT DETECTED Final   Vibrio species NOT DETECTED NOT DETECTED Final   Vibrio cholerae NOT DETECTED NOT DETECTED Final   Enteroaggregative E coli (EAEC) NOT DETECTED NOT DETECTED Final   Enteropathogenic E coli (EPEC) NOT DETECTED NOT DETECTED Final   Enterotoxigenic E coli (ETEC) NOT DETECTED NOT DETECTED Final   Shiga like toxin producing E coli (STEC) NOT DETECTED NOT DETECTED Final   Shigella/Enteroinvasive E coli (EIEC) NOT DETECTED NOT DETECTED Final   Cryptosporidium NOT DETECTED NOT DETECTED Final   Cyclospora cayetanensis NOT DETECTED NOT DETECTED Final   Entamoeba histolytica NOT DETECTED NOT DETECTED Final   Giardia lamblia NOT DETECTED NOT DETECTED Final    Adenovirus F40/41 NOT DETECTED NOT DETECTED Final   Astrovirus NOT DETECTED NOT DETECTED Final   Norovirus GI/GII NOT DETECTED NOT DETECTED Final   Rotavirus A NOT DETECTED NOT DETECTED Final   Sapovirus (I, II, IV, and V) NOT DETECTED NOT DETECTED Final  Comment: Performed at [facility], [place] [place], [place] [address]  C Difficile Quick Screen w PCR reflex     Status: None   Collection Time: 02/05/21  3:39 PM  Result Value Ref Range Status   C Diff antigen NEGATIVE NEGATIVE Final   C Diff toxin NEGATIVE NEGATIVE Final   C Diff interpretation No C. difficile detected.  Final    Comment: Performed at [facility], [place] [address], [place], [place] [address]      Studies: No results found.  Scheduled Meds:  allopurinol  200 mg Oral Daily   cephALEXin  500 mg Oral Q6H   Chlorhexidine Gluconate Cloth  6 each Topical Daily   feeding supplement  237 mL Oral BID BM   folic acid  1 mg Oral Daily   metoprolol tartrate  25 mg Oral BID   morphine  60 mg Oral Q12H   multivitamin with minerals  1 tablet Oral Daily   pantoprazole  40 mg Oral Daily   rosuvastatin  20 mg Oral Daily    Continuous Infusions:      LOS: 9 days     Vernell Leep, MD,  FACP, [facility], [facility], [facility] (Care Management Physician Certified) [place]  To contact the attending provider between 7A-7P or the covering provider during after hours 7P-7A, please log into the web site [email or web address] and access using universal [place] password for that web site. If you do not have the password, please call the hospital operator.

## 2021-02-12 NOTE — Progress Notes (Signed)
   02/12/21 1218  Mobility  Activity Refused mobility   Pt refused mobility secondary to feel "dizzy-headed". Will check back tomorrow.  [place] Specialist Acute Rehab Services Office: [phone]

## 2021-02-12 NOTE — Progress Notes (Signed)
Pt refused bed and chair alarm. Pt is moderate fall risk. Pt educated on purpose of alarms. Pt verbalized understanding.

## 2021-02-12 NOTE — Plan of Care (Signed)

## 2021-02-12 NOTE — Progress Notes (Addendum)
Progress Note   Subjective  Chief Complaint: Right upper quadrant pain, choledocholithiasis and stage IV non-small cell lung cancer, status post ERCP 02/10/2021, now with an increase in LFTs  Today, the patient tells me that he continues to do fairly well.  He did eat 2 hamburgers last night for dinner and thinks that he "overate".  Does describe a little bit of nausea this morning but no vomiting.  Continues with his right-sided abdominal pain which is really not changed.  He has only had one small bowel movement since time of procedure.   Objective   Vital signs in last 24 hours: Temp:  [97.7 F (36.5 C)-97.9 F (36.6 C)] 97.9 F (36.6 C) (12/09 0547) Pulse Rate:  [70-95] 88 (12/09 0846) Resp:  [18-19] 18 (12/09 0547) BP: (100-134)/(61-78) 110/78 (12/09 0846) SpO2:  [92 %-100 %] 96 % (12/09 0547) Last BM Date: 02/10/21 General:    white male in NAD Heart:  Regular rate and rhythm; no murmurs Lungs: Respirations even and unlabored, lungs CTA bilaterally Abdomen:  Soft, continued Right sided abdominal ttp and nondistended. Normal bowel sounds. Psych:  Cooperative. Normal mood and affect.  Intake/Output from previous day: 12/08 0701 - 12/09 0700 In: 140.9 [I.V.:140.9] Out: -  Intake/Output this shift: Total I/O In: 120 [P.O.:120] Out: 1 [Urine:1]  Lab Results: Recent Labs    02/11/21 0422 02/12/21 0341 02/12/21 0900  WBC 5.1 4.5 3.8*  HGB 8.5* 7.9* 8.5*  HCT 26.1* 24.2* 26.5*  PLT 138* 142* 163   BMET Recent Labs    02/10/21 0253 02/11/21 0422 02/12/21 0341  NA 142 135 140  K 4.4 5.2* 4.0  CL 104 100 102  CO2 31 28 31   GLUCOSE 96 141* 111*  BUN 21 23 24*  CREATININE 1.20 1.37* 1.37*  CALCIUM 8.8* 8.8* 8.5*   LFT Recent Labs    02/12/21 0341  PROT 6.2*  ALBUMIN 3.0*  AST 214*  ALT 149*  ALKPHOS 102  BILITOT 0.5   Studies/Results: DG ERCP WITH SPHINCTEROTOMY  Result Date: 02/10/2021 CLINICAL DATA:  65 year old male with history of common bile  duct obstruction. EXAM: ERCP TECHNIQUE: Multiple spot images obtained with the fluoroscopic device and submitted for interpretation post-procedure. FLUOROSCOPY TIME:  Fluoroscopy Time:  7 minutes, 2 seconds Radiation Exposure Index (if provided by the fluoroscopic device): Not provided. Number of Acquired Spot Images: 5 COMPARISON:  CT abdomen pelvis from 02/05/2021 FINDINGS: Duodenal scope is positioned near the second portion the duodenum. Retrograde cannulation of the common bile duct is performed and cholangiogram demonstrates multiple rounded filling defects in the common bile duct compatible with findings on comparison CT. Balloon sweep and sphincterotomy is performed with clearance of the filling defects. IMPRESSION: Choledocholithiasis with balloon sweep and sphincterotomy. These images were submitted for radiologic interpretation only. Please see the procedural report for full procedural details and the amount of contrast and the fluoroscopy time utilized. Electronically Signed   By: Ruthann Cancer M.D.   On: 02/10/2021 14:30   DG C-Arm 1-60 Min-No Report  Result Date: 02/10/2021 Fluoroscopy was utilized by the requesting physician.  No radiographic interpretation.     Assessment / Plan:   Assessment: 1.  Choledocholithiasis: Status post ERCP 02/10/2021 with balloon sweep and sphincterotomy, LFTs increasing 2.  Stage IV non-small cell lung cancer 3.  Thrombocytopenia and leukopenia: Improved 4.  Elevated LFTs: LFTs increasing dramatically overnight, consider Dili versus other causes of hepatitis  Plan: 1.  Patient will need to stay overnight with  continued monitoring of his LFTs 2.  Will discuss further liver serologies with Dr. Carlean Purl this could be DILI given recent chemotherapy regimen which included Keytruda.  May need to also repeat imaging with question of repeat choledocholithiasis? 3.  Continue regular diet  Thank you for your kind consultation, we will continue to follow.   LOS: 9  days   Levin Erp  02/12/2021, 11:49 AM     [facility] Attending   I have taken an interval history, reviewed the chart and examined the patient. I agree with the Advanced Practitioner's note, impression and recommendations.  Majority the medical decision-making in the formulation of the assessment and plan were performed by me.  Will get MRCP to exclude retained stones in CBD   Gatha Mayer, MD, [facility] 02/12/2021 2:59 PM

## 2021-02-12 NOTE — Progress Notes (Signed)
Pt transported off unit to MRI for a procedure. Delia Heady RN

## 2021-02-13 LAB — COMPREHENSIVE METABOLIC PANEL
ALT: 116 U/L — ABNORMAL HIGH (ref 0–44)
AST: 117 U/L — ABNORMAL HIGH (ref 15–41)
Albumin: 3.2 g/dL — ABNORMAL LOW (ref 3.5–5.0)
Alkaline Phosphatase: 82 U/L (ref 38–126)
Anion gap: 7 (ref 5–15)
BUN: 19 mg/dL (ref 8–23)
CO2: 31 mmol/L (ref 22–32)
Calcium: 8.6 mg/dL — ABNORMAL LOW (ref 8.9–10.3)
Chloride: 104 mmol/L (ref 98–111)
Creatinine, Ser: 1.17 mg/dL (ref 0.61–1.24)
GFR, Estimated: >60 mL/min (ref >60)
Glucose, Bld: 88 mg/dL (ref 70–99)
Potassium: 3.8 mmol/L (ref 3.5–5.1)
Sodium: 142 mmol/L (ref 135–145)
Total Bilirubin: 0.7 mg/dL (ref 0.3–1.2)
Total Protein: 6.2 g/dL — ABNORMAL LOW (ref 6.5–8.1)

## 2021-02-13 LAB — CBC
HCT: 25.8 % — ABNORMAL LOW (ref 39.0–52.0)
Hemoglobin: 8.1 g/dL — ABNORMAL LOW (ref 13.0–17.0)
MCH: 30.9 pg (ref 26.0–34.0)
MCHC: 31.4 g/dL (ref 30.0–36.0)
MCV: 98.5 fL (ref 80.0–100.0)
Platelets: 172 10*3/uL (ref 150–400)
RBC: 2.62 MIL/uL — ABNORMAL LOW (ref 4.22–5.81)
RDW: 17.1 % — ABNORMAL HIGH (ref 11.5–15.5)
WBC: 3.3 10*3/uL — ABNORMAL LOW (ref 4.0–10.5)
nRBC: 0 % (ref 0.0–0.2)

## 2021-02-13 MED ORDER — PANTOPRAZOLE SODIUM 40 MG PO TBEC: 40.0000 mg | DELAYED_RELEASE_TABLET | Freq: Every day | ORAL | 0 refills | Status: DC

## 2021-02-13 MED ORDER — ADULT MULTIVITAMIN W/MINERALS CH: 1.0000 | ORAL_TABLET | Freq: Every day | ORAL | 0 refills | Status: DC

## 2021-02-13 MED ORDER — ENSURE ENLIVE PO LIQD: 237.0000 mL | Freq: Two times a day (BID) | ORAL | 12 refills | Status: DC

## 2021-02-13 MED ORDER — HEPARIN SOD (PORK) LOCK FLUSH 100 UNIT/ML IV SOLN
500.0000 [IU] | INTRAVENOUS | Status: DC | PRN
  Filled 2021-02-13: qty 5

## 2021-02-13 MED ORDER — DOCUSATE SODIUM 100 MG PO CAPS
100.0000 mg | ORAL_CAPSULE | Freq: Two times a day (BID) | ORAL | 0 refills | Status: AC
Start: 2021-02-13 — End: ?

## 2021-02-13 NOTE — Progress Notes (Signed)
Pt requested stool softener with complaints of unable to fully move bowels. Offered pt miralax, pt refused. Will proceed with scheduled colace as ordered.

## 2021-02-13 NOTE — Progress Notes (Signed)
Pt back to unit from MRI. Delia Heady RN

## 2021-02-13 NOTE — Progress Notes (Signed)
   Patient Name: [name] [name] Date of Encounter: 02/13/2021, 12:02 PM    Subjective  Feels okay still some right upper quadrant discomfort stronger than he was sister in room with him   Objective  BP (!) 104/51 (BP Location: Right Arm)   Pulse 64   Temp 98.3 F (36.8 C) (Oral)   Resp 16   Ht 5\' 8"  (1.727 m)   Wt 103.9 kg   SpO2 91%   BMI 34.82 kg/m  Abdomen is obese and he has this persistent right upper quadrant mild tenderness.  Recent Labs  Lab 02/09/21 0532 02/10/21 0253 02/11/21 0422 02/12/21 0341 02/13/21 0445  AST 63* 67* 70* 214* 117*  ALT 47* 50* 54* 149* 116*  ALKPHOS 68 59 61 102 82  BILITOT 0.6 0.6 1.0 0.5 0.7  PROT 6.3* 6.3* 6.4* 6.2* 6.2*  ALBUMIN 3.0* 2.9* 3.1* 3.0* 3.2*    IMPRESSION: Choledocholithiasis with at least two mid/distal CBD stones measuring up to 5 mm.   Cholelithiasis, without associated inflammatory changes to suggest acute cholecystitis.   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   Additional ancillary findings as above   Assessment and Plan  Abnormal transaminases cause not clear.  Persistent or new choledocholithiasis (I thought that I had cleared his bile duct but perhaps not versus gallstones moving from gallbladder to bile duct.  I doubt the choledocholithiasis is causing problems right now and he has had a sphincterotomy.  His current illness and chemotherapy toxicity may be implicated in the transaminitis.  He has this what seems to be relatively chronic mild right upper quadrant tenderness.  Given his overall situation with metastatic lung cancer undergoing treatment I do not think it makes sense to repeat an ERCP for these 2 stones, he has had a biliary sphincterotomy.  He can be monitored as an outpatient with labs in oncology and follow-up with GI as needed.  Should he develop fever signs of cholangitis obstructive problems we could revisit the situation.  He  has not had any of those to date.  I will message his oncologist so he is aware of the situation.  From my perspective he is ready for discharge.  Signing off.  Gatha Mayer, MD, [facility] 02/13/2021 12:02 PM

## 2021-02-13 NOTE — Progress Notes (Signed)
Patient will be discharging home with family later this afternoon. Belongings returned. Education on medication will be provided.

## 2021-02-13 NOTE — Discharge Summary (Signed)
Discharge Summary  [name] [name]:[id number] DOB: 09/02/1955  PCP: [name], MD  Admit date: 02/03/2021 Discharge date: 02/13/2021  Time spent: 30 minutes  Recommendations for Outpatient Follow-up:  GI Discharge Diagnoses:  Active Hospital Problems   Diagnosis Date Noted   Antineoplastic chemotherapy induced pancytopenia ([place]) 02/03/2021   Gastritis and gastroduodenitis    Choledocholithiasis    Thrombocytopenia ([place]) 02/03/2021   Pancytopenia ([place]) 02/03/2021   Pancytopenia due to antineoplastic chemotherapy ([place]) 02/03/2021   COPD (chronic obstructive pulmonary disease) ([place])    Right lower lobe lung mass 09/10/2020   Anxiety 01/13/2017   Chronic pain 01/13/2017   Essential hypertension     Resolved Hospital Problems  No resolved problems to display.    Discharge Condition: Improved  Diet recommendation: Regular  Vitals:   02/13/21 0516 02/13/21 1308  BP: (!) 104/51 (!) 96/50  Pulse: 64 85  Resp: 16 16  Temp: 98.3 F (36.8 C) 98.5 F (36.9 C)  SpO2: 91% 96%    History of present illness:  65 year old male who is receiving palliative chemotherapy for stage IV T3N2 MIC non-small cell lung CA with malignant lymphadenopathy in the chest.  He was admitted due to pancytopenia from lab that was performed at the cancer center his platelet had gone down to 5 5000 and he was symptomatic with associated dizziness and blood in his stool.  GI was consulted for symptomatic choledocholithiasis.  He underwent ERCP on December 7 with intervention but it was noted that after the procedure his LFTs were rising to more than double the levels and GI was reconsulted.  He still had 2 stones but he did not feel that a repeat ERCP was needed he recommended to follow-up as outpatient with oncology for labs and GI as needed and to return if he should develop fever with signs of cholangitis or obstructive problem  Hospital Course:  Principal Problem:   Antineoplastic  chemotherapy induced pancytopenia ([place]) Active Problems:   Essential hypertension   Chronic pain   Anxiety   Right lower lobe lung mass   Thrombocytopenia ([facility])   COPD (chronic obstructive pulmonary disease) ([facility])   Pancytopenia ([facility])   Pancytopenia due to antineoplastic chemotherapy ([place])   Choledocholithiasis   Gastritis and gastroduodenitis 65 year old male who is receiving palliative chemotherapy for stage IV T3N2 MIC non-small cell lung CA with malignant lymphadenopathy in the chest.  He was admitted due to pancytopenia from lab that was performed at the cancer center his platelet had gone down to 5 5000 and he was symptomatic with associated dizziness and blood in his stool.  GI was consulted for symptomatic choledocholithiasis.  He underwent ERCP on December 7 with intervention but it was noted that after the procedure his LFTs were rising to more than double the levels and GI was reconsulted.  He still had 2 stones but he did not feel that a repeat ERCP was needed he recommended to follow-up as outpatient with oncology for labs and GI as needed and to return if he should develop fever with signs of cholangitis or obstructive problem.  He was also noted to have came out therapy induced pancytopenia and he received 2 units of packed RBC and 2 units of pheresis packed platelet.  His hemoglobin stabilized thrombocytopenia resolved plan his AKI resolved with IV hydration  Procedures: ERCP on February 10, 2021 Blood transfusion 2 units and 2 units of platelets  Consultations: Medical oncology GI  Discharge Exam: BP (!) 96/50 (BP Location: Left Arm)  Pulse 85   Temp 98.5 F (36.9 C)   Resp 16   Ht 5\' 8"  (1.727 m)   Wt 103.9 kg   SpO2 96%   BMI 34.82 kg/m   General: Alert oriented slightly obese no distress Cardiovascular: Regular rate Respiratory: Clear to auscultation bilaterally  Discharge Instructions You were cared for by a hospitalist during your hospital stay. If you  have any questions about your discharge medications or the care you received while you were in the hospital after you are discharged, you can call the unit and asked to speak with the hospitalist on call if the hospitalist that took care of you is not available. Once you are discharged, your primary care physician will handle any further medical issues. Please note that NO REFILLS for any discharge medications will be authorized once you are discharged, as it is imperative that you return to your primary care physician (or establish a relationship with a primary care physician if you do not have one) for your aftercare needs so that they can reassess your need for medications and monitor your lab values.  Discharge Instructions     Call MD for:  persistant nausea and vomiting   Complete by: As directed    Call MD for:  severe uncontrolled pain   Complete by: As directed    Call MD for:  temperature >100.4   Complete by: As directed    Diet - low sodium heart healthy   Complete by: As directed    Diet general   Complete by: As directed    Discharge instructions   Complete by: As directed    Per GI:He can be monitored as an outpatient with labs in oncology and follow-up with GI as needed.  Should he develop fever signs of cholangitis obstructive problems we could revisit the situation.   Increase activity slowly   Complete by: As directed       Allergies as of 02/13/2021       Reactions   Fish Allergy Nausea And Vomiting   Iohexol Other (See Comments)    Code: HIVES, Desc: PT STATES HE HAD AN IVP 15 YRS AGO AND HAD SOB AND BROKE OUT IN HIVES., Onset Date: [id number]   Ivp Dye [iodinated Diagnostic Agents] Other (See Comments)   intolerance        Medication List     STOP taking these medications    prochlorperazine 10 MG tablet Commonly known as: COMPAZINE   sulfamethoxazole-trimethoprim 800-160 MG tablet Commonly known as: BACTRIM DS       TAKE these medications     acetaminophen 500 MG tablet Commonly known as: TYLENOL Take 500-1,000 mg by mouth every 6 (six) hours as needed for moderate pain or headache.   allopurinol 100 MG tablet Commonly known as: ZYLOPRIM Take 200 mg daily by mouth.   calcium carbonate 750 MG chewable tablet Commonly known as: TUMS EX Chew 2-4 tablets by mouth daily as needed for heartburn.   clonazePAM 1 MG tablet Commonly known as: KLONOPIN Take 1 mg by mouth at bedtime as needed for anxiety.   docusate sodium 100 MG capsule Commonly known as: COLACE Take 1 capsule (100 mg total) by mouth 2 (two) times daily.   feeding supplement Liqd Take 237 mLs by mouth 2 (two) times daily between meals.   fluticasone 50 MCG/ACT nasal spray Commonly known as: FLONASE Place 1 spray into both nostrils daily as needed for allergies.   folic acid 1 MG tablet Commonly  known as: FOLVITE Take 1 tablet (1 mg total) by mouth daily. During treatment with Alimta (chemo)   furosemide 20 MG tablet Commonly known as: Lasix Take 1 tablet (20 mg total) by mouth daily as needed.   hydrOXYzine 50 MG capsule Commonly known as: VISTARIL Take 50 mg by mouth 3 (three) times daily as needed for itching.   metoprolol tartrate 25 MG tablet Commonly known as: LOPRESSOR TAKE 1 TABLET(25 MG) BY MOUTH TWICE DAILY What changed: See the new instructions.   morphine 30 MG tablet Commonly known as: MSIR Take 30 mg by mouth every 6 (six) hours as needed.   morphine 60 MG 12 hr tablet Commonly known as: MS CONTIN 60 mg every 12 (twelve) hours.   multivitamin with minerals Tabs tablet Take 1 tablet by mouth daily. Start taking on: February 14, 2021   nitroGLYCERIN 0.4 MG SL tablet Commonly known as: NITROSTAT Place 1 tablet (0.4 mg total) under the tongue every 5 (five) minutes as needed for chest pain. X 3 doses   ondansetron 8 MG tablet Commonly known as: ZOFRAN Take 1 tablet (8 mg total) by mouth every 8 (eight) hours as needed for  nausea or vomiting. Starting 3 days after chemotherapy   pantoprazole 40 MG tablet Commonly known as: PROTONIX Take 1 tablet (40 mg total) by mouth daily. Start taking on: February 14, 2021   potassium chloride SA 20 MEQ tablet Commonly known as: KLOR-CON M Take 1 tablet (20 mEq total) by mouth daily.   rosuvastatin 20 MG tablet Commonly known as: CRESTOR TAKE 1 TABLET(20 MG) BY MOUTH DAILY What changed: See the new instructions.   ticagrelor 60 MG Tabs tablet Commonly known as: Brilinta Take 1 tablet (60 mg total) by mouth 2 (two) times daily. What changed: See the new instructions.       Allergies  Allergen Reactions   Fish Allergy Nausea And Vomiting   Iohexol Other (See Comments)     Code: HIVES, Desc: PT STATES HE HAD AN IVP 15 YRS AGO AND HAD SOB AND BROKE OUT IN HIVES., Onset Date: [id number]    Ivp Dye [Iodinated Diagnostic Agents] Other (See Comments)    intolerance      The results of significant diagnostics from this hospitalization (including imaging, microbiology, ancillary and laboratory) are listed below for reference.    Significant Diagnostic Studies: CT ABDOMEN PELVIS WO CONTRAST  Result Date: 02/05/2021 CLINICAL DATA:  Lung cancer restaging EXAM: CT ABDOMEN AND PELVIS WITHOUT CONTRAST TECHNIQUE: Multidetector CT imaging of the abdomen and pelvis was performed following the standard protocol without IV contrast. COMPARISON:  CT abdomen and pelvis 12/04/2020 FINDINGS: Hepatobiliary: Liver is enlarged measuring 18.8 cm in length. No suspicious hepatic mass visualized. Several stones identified in the dependent gallbladder. No gallbladder wall thickening or pericholecystic edema appreciated. Multiple calcific densities identified in the common bile duct consistent with choledocholithiasis, measuring up to 1 cm in size. The common bile duct measures approximately 12 mm in diameter. Similar to previous study. Pancreas: Atrophic with no suspicious mass or ductal  dilatation identified. Spleen: Enlarged measuring 13.8 cm in length. Adrenals/Urinary Tract: Right adrenal gland is normal. Interval decreased size of the left adrenal gland mass now measuring 3.8 x 4.1 cm. No nephrolithiasis or hydronephrosis identified bilaterally. A few hypodense renal cortical cysts identified measuring up to 2.1 cm on the right and 3.1 cm on the left. Tiny calcific densities again seen in the region of the left renal pelvis which are nonspecific. Bilateral perinephric  fat stranding which is mildly increased since previous study and nonspecific. Mild urinary bladder wall thickening. Stomach/Bowel: No bowel obstruction, free air or pneumatosis. Colonic diverticulosis. No bowel wall edema. Vascular/Lymphatic: Aortic atherosclerosis. No enlarged abdominal or pelvic lymph nodes. Reproductive: Prostate is unremarkable. Other: No ascites. Musculoskeletal: No suspicious bony lesions identified. IMPRESSION: 1. No new mass or lymphadenopathy identified in the abdomen or pelvis. 2. Interval decreased size of the left adrenal gland mass now measuring up to 4.1 cm. 3. Hepatosplenomegaly. 4. Cholelithiasis and choledocholithiasis. Correlate clinically for need for ERCP. 5. Other ancillary findings as described. Electronically Signed   By: Ofilia Neas M.D.   On: 02/05/2021 12:49   CT CHEST WO CONTRAST  Result Date: 02/05/2021 CLINICAL DATA:  Lung cancer, evaluate treatment response EXAM: CT CHEST WITHOUT CONTRAST TECHNIQUE: Multidetector CT imaging of the chest was performed following the standard protocol without IV contrast. COMPARISON:  CT chest 12/04/2020 FINDINGS: Cardiovascular: Heart size is normal. No pericardial effusion identified. Coronary artery calcifications noted. Main pulmonary artery is upper normal caliber. Thoracic aorta is normal caliber with moderate calcified plaques. Right-sided central venous port. Mediastinum/Nodes: No axillary or mediastinal lymphadenopathy identified.  Stable appearing enlarged lymph node in the right hilum measuring 1.9 cm. Lungs/Pleura: Hyperinflated lungs with mild emphysematous changes. Interval decreased size of an amorphous cavitary mass at the right lung base near the major fissure now measuring approximally 2.7 x 2.1 cm in axial dimensions. Redemonstration of several small pulmonary nodules which are stable in size and measure up to 6 mm in the right lower lobe near the major fissure, 9 mm pleural-based in the right middle lobe, and 5 mm in the left lower lobe. No new or significantly enlarged nodules identified. Trace bilateral pleural effusions right greater than left. No pneumothorax. Musculoskeletal: No suspicious bony lesions identified. IMPRESSION: 1. Evidence of treatment response. Decreased size of the dominant cavitary mass lesion at the right lung base since previous study. Stable additional smaller pulmonary nodules. 2. Stable appearing enlarged right hilar lymph node. 3. Other chronic findings as described. Aortic Atherosclerosis (ICD10-I70.0) and Emphysema (ICD10-J43.9). Electronically Signed   By: Ofilia Neas M.D.   On: 02/05/2021 13:03   MR 3D Recon At Scanner  Result Date: 02/12/2021 CLINICAL DATA:  Choledocholithiasis, status post ERCP with balloon sweep and sphincterotomy EXAM: MRI ABDOMEN WITHOUT AND WITH CONTRAST (INCLUDING MRCP) TECHNIQUE: Multiplanar multisequence MR imaging of the abdomen was performed both before and after the administration of intravenous contrast. Heavily T2-weighted images of the biliary and pancreatic ducts were obtained, and three-dimensional MRCP images were rendered by post processing. CONTRAST:  42mL GADAVIST GADOBUTROL 1 MMOL/ML IV SOLN COMPARISON:  CT abdomen/pelvis dated 02/05/2021 FINDINGS: Lower chest: Lung bases are clear. Hepatobiliary: Mild hepatic steatosis. No suspicious/enhancing hepatic lesions. Layering small gallstones (series 19/image 13), with mild gallbladder distension. No  gallbladder wall thickening or pericholecystic fluid. No intrahepatic ductal dilatation. Common duct measures 7 mm (series 9/image 13). At least two mid/distal CBD stones measuring up to 5 mm (series 9/image 14). Pancreas:  Within normal limits. Spleen:  Within normal limits. Adrenals/Urinary Tract: 4.0 x 3.6 cm centrally necrotic left adrenal mass (series 19/image 16), decreased from priors, favoring improving metastasis. Right adrenal gland is within normal limits. Bilateral renal cysts, measuring up to 3.3 cm in the left lower kidney. No hydronephrosis. Stomach/Bowel: Stomach is within normal limits. Visualized bowel is grossly unremarkable. Vascular/Lymphatic:  No evidence of abdominal aortic aneurysm. No suspicious abdominal lymphadenopathy. Other:  No abdominal ascites. Musculoskeletal: No focal  osseous lesions. IMPRESSION: Choledocholithiasis with at least two mid/distal CBD stones measuring up to 5 mm. Cholelithiasis, without associated inflammatory changes to suggest acute cholecystitis. 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 Additional ancillary findings as above. Electronically Signed   By: Julian Hy M.D.   On: 02/12/2021 23:44   DG ERCP WITH SPHINCTEROTOMY  Result Date: 02/10/2021 CLINICAL DATA:  65 year old male with history of common bile duct obstruction. EXAM: ERCP TECHNIQUE: Multiple spot images obtained with the fluoroscopic device and submitted for interpretation post-procedure. FLUOROSCOPY TIME:  Fluoroscopy Time:  7 minutes, 2 seconds Radiation Exposure Index (if provided by the fluoroscopic device): Not provided. Number of Acquired Spot Images: 5 COMPARISON:  CT abdomen pelvis from 02/05/2021 FINDINGS: Duodenal scope is positioned near the second portion the duodenum. Retrograde cannulation of the common bile duct is performed and cholangiogram demonstrates multiple rounded filling defects in the common bile duct compatible  with findings on comparison CT. Balloon sweep and sphincterotomy is performed with clearance of the filling defects. IMPRESSION: Choledocholithiasis with balloon sweep and sphincterotomy. These images were submitted for radiologic interpretation only. Please see the procedural report for full procedural details and the amount of contrast and the fluoroscopy time utilized. Electronically Signed   By: Ruthann Cancer M.D.   On: 02/10/2021 14:30   DG C-Arm 1-60 Min-No Report  Result Date: 02/10/2021 Fluoroscopy was utilized by the requesting physician.  No radiographic interpretation.   MR ABDOMEN MRCP W WO CONTAST  Result Date: 02/12/2021 CLINICAL DATA:  Choledocholithiasis, status post ERCP with balloon sweep and sphincterotomy EXAM: MRI ABDOMEN WITHOUT AND WITH CONTRAST (INCLUDING MRCP) TECHNIQUE: Multiplanar multisequence MR imaging of the abdomen was performed both before and after the administration of intravenous contrast. Heavily T2-weighted images of the biliary and pancreatic ducts were obtained, and three-dimensional MRCP images were rendered by post processing. CONTRAST:  71mL GADAVIST GADOBUTROL 1 MMOL/ML IV SOLN COMPARISON:  CT abdomen/pelvis dated 02/05/2021 FINDINGS: Lower chest: Lung bases are clear. Hepatobiliary: Mild hepatic steatosis. No suspicious/enhancing hepatic lesions. Layering small gallstones (series 19/image 13), with mild gallbladder distension. No gallbladder wall thickening or pericholecystic fluid. No intrahepatic ductal dilatation. Common duct measures 7 mm (series 9/image 13). At least two mid/distal CBD stones measuring up to 5 mm (series 9/image 14). Pancreas:  Within normal limits. Spleen:  Within normal limits. Adrenals/Urinary Tract: 4.0 x 3.6 cm centrally necrotic left adrenal mass (series 19/image 16), decreased from priors, favoring improving metastasis. Right adrenal gland is within normal limits. Bilateral renal cysts, measuring up to 3.3 cm in the left lower kidney.  No hydronephrosis. Stomach/Bowel: Stomach is within normal limits. Visualized bowel is grossly unremarkable. Vascular/Lymphatic:  No evidence of abdominal aortic aneurysm. No suspicious abdominal lymphadenopathy. Other:  No abdominal ascites. Musculoskeletal: No focal osseous lesions. IMPRESSION: Choledocholithiasis with at least two mid/distal CBD stones measuring up to 5 mm. Cholelithiasis, without associated inflammatory changes to suggest acute cholecystitis. 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 Additional ancillary findings as above. Electronically Signed   By: Julian Hy M.D.   On: 02/12/2021 23:44   IR IMAGING GUIDED PORT INSERTION  Result Date: 02/02/2021 CLINICAL DATA:  non-small cell lung carcinoma, needs durable venous access for planned treatment regimen EXAM: TUNNELED PORT CATHETER PLACEMENT WITH ULTRASOUND AND FLUOROSCOPIC GUIDANCE FLUOROSCOPY TIME:  seconds ANESTHESIA/SEDATION: Intravenous Fentanyl 12mcg and Versed 2mg  were administered as conscious sedation during continuous monitoring of the patient's level of consciousness and physiological /  cardiorespiratory status by the radiology RN, with a total moderate sedation time of 15 minutes. TECHNIQUE: The procedure, risks, benefits, and alternatives were explained to the patient. Questions regarding the procedure were encouraged and answered. The patient understands and consents to the procedure. Patency of the right IJ vein was confirmed with ultrasound with image documentation. An appropriate skin site was determined. Skin site was marked. Region was prepped using maximum barrier technique including cap and mask, sterile gown, sterile gloves, large sterile sheet, and Chlorhexidine as cutaneous antisepsis. The region was infiltrated locally with 1% lidocaine. Under real-time ultrasound guidance, the right IJ vein was accessed with a 21 gauge micropuncture needle; the needle  tip within the vein was confirmed with ultrasound image documentation. Needle was exchanged over a 018 guidewire for transitional dilator, and vascular measurement was performed. A small incision was made on the right anterior chest wall and a subcutaneous pocket fashioned. The power-injectable port was positioned and its catheter tunneled to the right IJ dermatotomy site. The transitional dilator was exchanged over an Amplatz wire for a peel-away sheath, through which the port catheter, which had been trimmed to the appropriate length, was advanced and positioned under fluoroscopy with its tip at the cavoatrial junction. Spot chest radiograph confirms good catheter position and no pneumothorax. The port was flushed per protocol. The pocket was closed with deep interrupted and subcuticular continuous 3-0 Monocryl sutures. The incisions were covered with Dermabond then covered with a sterile dressing. The patient tolerated the procedure well. COMPLICATIONS: COMPLICATIONS None immediate IMPRESSION: Technically successful right IJ power-injectable port catheter placement. Ready for routine use. Electronically Signed   By: Lucrezia Europe M.D.   On: 02/02/2021 15:41    Microbiology: Recent Results (from the past 240 hour(s))  Culture, blood (x 2)     Status: None   Collection Time: 02/03/21  9:06 PM   Specimen: BLOOD  Result Value Ref Range Status   Specimen Description   Final    BLOOD BLOOD RIGHT HAND Performed at [facility] [address] [place], [place] [address]    Special Requests   Final    BOTTLES DRAWN AEROBIC AND ANAEROBIC Blood Culture adequate volume Performed at [facility] [address], [place], [place] [address]    Culture   Final    NO GROWTH 5 DAYS Performed at [facility], [place] [address] [place], [place] [address]    Report Status 02/09/2021 FINAL  Final  Culture, blood (x 2)     Status: None   Collection Time: 02/03/21  9:06 PM    Specimen: BLOOD  Result Value Ref Range Status   Specimen Description   Final    BLOOD BLOOD LEFT FOREARM Performed at [facility] [address], [place], [place] [address]    Special Requests   Final    BOTTLES DRAWN AEROBIC AND ANAEROBIC Blood Culture adequate volume Performed at [facility] [address] [place], [place] [address]    Culture   Final    NO GROWTH 5 DAYS Performed at [facility], [place] [address] [place], [place] [address]    Report Status 02/09/2021 FINAL  Final  Resp Panel by RT-PCR (Flu A&B, Covid) Nasopharyngeal Swab     Status: None   Collection Time: 02/03/21 11:21 PM   Specimen: Nasopharyngeal Swab; Nasopharyngeal(NP) swabs in vial transport medium  Result Value Ref Range Status   SARS Coronavirus 2 by RT PCR NEGATIVE  NEGATIVE Final    Comment: (NOTE) SARS-CoV-2 target nucleic acids are NOT DETECTED.  The SARS-CoV-2 RNA is generally detectable in upper respiratory specimens during the acute phase of infection. The lowest concentration of SARS-CoV-2 viral copies this assay can detect is 138 copies/mL. A negative result does not preclude SARS-Cov-2 infection and should not be used as the sole basis for treatment or other patient management decisions. A negative result may occur with  improper specimen collection/handling, submission of specimen other than nasopharyngeal swab, presence of viral mutation(s) within the areas targeted by this assay, and inadequate number of viral copies(<138 copies/mL). A negative result must be combined with clinical observations, patient history, and epidemiological information. The expected result is Negative.  Fact Sheet for Patients:  EntrepreneurPulse.com.au  Fact Sheet for Healthcare Providers:  IncredibleEmployment.be  This test is no t yet approved or cleared by the [place] FDA and  has been authorized for detection and/or  diagnosis of SARS-CoV-2 by FDA under an Emergency Use Authorization (EUA). This EUA will remain  in effect (meaning this test can be used) for the duration of the COVID-19 declaration under Section 564(b)(1) of the Act, 21 U.S.C.section 360bbb-3(b)(1), unless the authorization is terminated  or revoked sooner.       Influenza A by PCR NEGATIVE NEGATIVE Final   Influenza B by PCR NEGATIVE NEGATIVE Final    Comment: (NOTE) The Xpert Xpress SARS-CoV-2/FLU/RSV plus assay is intended as an aid in the diagnosis of influenza from Nasopharyngeal swab specimens and should not be used as a sole basis for treatment. Nasal washings and aspirates are unacceptable for Xpert Xpress SARS-CoV-2/FLU/RSV testing.  Fact Sheet for Patients: EntrepreneurPulse.com.au  Fact Sheet for Healthcare Providers: IncredibleEmployment.be  This test is not yet approved or cleared by the [place] FDA and has been authorized for detection and/or diagnosis of SARS-CoV-2 by FDA under an Emergency Use Authorization (EUA). This EUA will remain in effect (meaning this test can be used) for the duration of the COVID-19 declaration under Section 564(b)(1) of the Act, 21 U.S.C. section 360bbb-3(b)(1), unless the authorization is terminated or revoked.  Performed at [facility], [place] [address] [place], [place] [address]   MRSA Next Gen by PCR, Nasal     Status: None   Collection Time: 02/04/21  4:37 PM   Specimen: Nasal Mucosa; Nasal Swab  Result Value Ref Range Status   MRSA by PCR Next Gen NOT DETECTED NOT DETECTED Final    Comment: (NOTE) The GeneXpert MRSA Assay (FDA approved for NASAL specimens only), is one component of a comprehensive MRSA colonization surveillance program. It is not intended to diagnose MRSA infection nor to guide or monitor treatment for MRSA infections. Test performance is not FDA approved in patients less than 63  years old. Performed at [facility], [place] [address] [place], [place] [address]   Gastrointestinal Panel by PCR , Stool     Status: None   Collection Time: 02/05/21  3:39 PM   Specimen: Stool  Result Value Ref Range Status   Campylobacter species NOT DETECTED NOT DETECTED Final   Plesimonas shigelloides NOT DETECTED NOT DETECTED Final   Salmonella species NOT DETECTED NOT DETECTED Final   Yersinia enterocolitica NOT DETECTED NOT DETECTED Final   Vibrio species NOT DETECTED NOT DETECTED Final   Vibrio cholerae NOT DETECTED NOT DETECTED Final   Enteroaggregative E coli (EAEC) NOT DETECTED NOT DETECTED Final   Enteropathogenic E coli (EPEC) NOT DETECTED NOT DETECTED Final  Enterotoxigenic E coli (ETEC) NOT DETECTED NOT DETECTED Final   Shiga like toxin producing E coli (STEC) NOT DETECTED NOT DETECTED Final   Shigella/Enteroinvasive E coli (EIEC) NOT DETECTED NOT DETECTED Final   Cryptosporidium NOT DETECTED NOT DETECTED Final   Cyclospora cayetanensis NOT DETECTED NOT DETECTED Final   Entamoeba histolytica NOT DETECTED NOT DETECTED Final   Giardia lamblia NOT DETECTED NOT DETECTED Final   Adenovirus F40/41 NOT DETECTED NOT DETECTED Final   Astrovirus NOT DETECTED NOT DETECTED Final   Norovirus GI/GII NOT DETECTED NOT DETECTED Final   Rotavirus A NOT DETECTED NOT DETECTED Final   Sapovirus (I, II, IV, and V) NOT DETECTED NOT DETECTED Final    Comment: Performed at [facility], [address] [place], [place] [address]  C Difficile Quick Screen w PCR reflex     Status: None   Collection Time: 02/05/21  3:39 PM  Result Value Ref Range Status   C Diff antigen NEGATIVE NEGATIVE Final   C Diff toxin NEGATIVE NEGATIVE Final   C Diff interpretation No C. difficile detected.  Final    Comment: Performed at [facility], [place] [address], [place], [place] [address]     Labs: Basic Metabolic Panel: Recent Labs  Lab 02/07/21 0310  02/08/21 0309 02/08/21 1528 02/09/21 0532 02/10/21 0253 02/11/21 0422 02/12/21 0341 02/13/21 0445  NA 138 140   < > 140 142 135 140 142  K 3.9 4.3   < > 4.4 4.4 5.2* 4.0 3.8  CL 105 105   < > 103 104 100 102 104  CO2 28 31   < > 30 31 28 31 31   GLUCOSE 105* 105*   < > 103* 96 141* 111* 88  BUN 18 21   < > 21 21 23  24* 19  CREATININE 1.35* 1.36*   < > 1.24 1.20 1.37* 1.37* 1.17  CALCIUM 8.3* 8.6*   < > 9.2 8.8* 8.8* 8.5* 8.6*  MG 1.7 1.7  --   --   --   --   --   --   PHOS  --  3.8  --   --   --   --   --   --    < > = values in this interval not displayed.   Liver Function Tests: Recent Labs  Lab 02/09/21 0532 02/10/21 0253 02/11/21 0422 02/12/21 0341 02/13/21 0445  AST 63* 67* 70* 214* 117*  ALT 47* 50* 54* 149* 116*  ALKPHOS 68 59 61 102 82  BILITOT 0.6 0.6 1.0 0.5 0.7  PROT 6.3* 6.3* 6.4* 6.2* 6.2*  ALBUMIN 3.0* 2.9* 3.1* 3.0* 3.2*   No results for input(s): LIPASE, AMYLASE in the last 168 hours. No results for input(s): AMMONIA in the last 168 hours. CBC: Recent Labs  Lab 02/07/21 0310 02/08/21 0309 02/08/21 1528 02/10/21 0253 02/11/21 0422 02/12/21 0341 02/12/21 0900 02/13/21 0445  WBC 9.5 3.6*   < > 3.1* 5.1 4.5 3.8* 3.3*  NEUTROABS 7.1 2.0  --   --   --   --   --   --   HGB 8.7* 8.2*   < > 8.4* 8.5* 7.9* 8.5* 8.1*  HCT 26.9* 25.3*   < > 25.8* 26.1* 24.2* 26.5* 25.8*  MCV 94.1 95.1   < > 96.3 94.2 96.0 96.7 98.5  PLT 46* 53*   < > 92* 138* 142* 163 172   < > = values in this interval not displayed.  Cardiac Enzymes: No results for input(s): CKTOTAL, CKMB, CKMBINDEX, TROPONINI in the last 168 hours. BNP: BNP (last 3 results) Recent Labs    08/31/20 1457  BNP 39.6    ProBNP (last 3 results) No results for input(s): PROBNP in the last 8760 hours.  CBG: No results for input(s): GLUCAP in the last 168 hours.     Signed:  [name], MD [facility] 02/13/2021, 3:44 PM

## 2021-02-16 NOTE — Progress Notes (Signed)
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 and Versed 61m57mere administered as conscious sedation during continuous monitoring of the patient's level of consciousness and physiological / cardiorespiratory status by the radiology RN, with a total moderate sedation time of 15 minutes. TECHNIQUE: The procedure, risks, benefits, and alternatives were explained to the patient. Questions regarding the procedure were encouraged and answered. The patient understands and consents to the procedure. Patency of the right IJ vein was confirmed with ultrasound with image documentation. An appropriate skin site was determined. Skin site was marked. Region was prepped using maximum barrier technique including cap and mask, sterile gown, sterile gloves, large sterile sheet, and Chlorhexidine as cutaneous antisepsis. The region was infiltrated locally with 1% lidocaine. Under real-time ultrasound guidance, the right IJ vein was accessed with a 21 gauge micropuncture needle; the needle tip within the vein was confirmed with  ultrasound image documentation. Needle was exchanged over a 018 guidewire for transitional dilator, and vascular measurement was performed. A small incision was made on the right anterior chest wall and a subcutaneous pocket fashioned. The power-injectable port was positioned and its catheter tunneled to the right IJ dermatotomy site. The transitional dilator was exchanged over an Amplatz wire for a peel-away sheath, through which the port catheter, which had been trimmed to the  appropriate length, was advanced and positioned under fluoroscopy with its tip at the cavoatrial junction. Spot chest radiograph confirms good catheter position and no pneumothorax. The port was flushed per protocol. The pocket was closed with deep interrupted and subcuticular continuous 3-0 Monocryl sutures. The incisions were covered with Dermabond then covered with a sterile dressing. The patient tolerated the procedure well. COMPLICATIONS: COMPLICATIONS None immediate IMPRESSION: Technically successful right IJ power-injectable port catheter placement. Ready for routine use. Electronically Signed   By: Lucrezia Europe M.D.   On: 02/02/2021 15:41     ASSESSMENT/PLAN:  This is a very pleasant 65 years old Caucasian male diagnosed with a stage IV (T3, N2, M1 C) non-small cell lung cancer, likely adenocarcinoma based on the presence of KRAS G12C mutation presented with large right lower lobe lung mass, enlarged with adjacent subpleural nodules in addition to right hilar and subcarinal lymphadenopathy and left adrenal gland metastasis diagnosed in July 2022.  Molecular studies showed positive KRAS G12C mutation, his histology is likely adenocarcinoma  The patient started initially on systemic chemotherapy with carboplatin for AUC of 5, paclitaxel 175 Mg/M2 and Keytruda 200 Mg IV every 3 weeks with Neulasta support based on the suspicious of histology of squamous cell carcinoma.  He has a rough time with this treatment with significant  fatigue and weakness as well as myalgia and arthralgia from the Neulasta injection. Based on the recent finding on the molecular studies was positive for KRAS G12C mutation, his histology is likely adenocarcinoma and he was switched to carboplatin for AUC of 5, Alimta 500 Mg/M2 and Keytruda 200 Mg IV every 3 weeks. He is status post 5 cycles.  Starting from cycle #3, the patient's carboplatin was reduced to AUC of 4 and Alimta was reduced to 400 mg per metered square.  He continues to have pancytopenia which required Granix injections and blood transfusions.  Alimta was permanently discontinued after cycle #5 due to continued pancytopenia for which he was hospitalized.   The patient will now only proceed with single agent immunotherapy with Keytruda due to intolerance of chemotherapy.  The patient recently had a restaging CT scan while admitted in the hospital.  The patient was seen with Dr. Julien Nordmann today who personally and independently reviewed the scan and discussed results with the patient today.  The scan showed continued treatment response.  Dr. Julien Nordmann recommends that the patient proceed with cycle #6 today as scheduled with single agent immunotherapy with [facility].  We will see him back for follow-up visit in 3 weeks for evaluation before starting cycle #7.  Advised the patient that if he is worsening abdominal discomfort that he needs to follow-up with gastroenterology sooner.  The patient is an established patient of [facility] for the choledocholithiasis.  His LFTs are downtrending from his hospital visit.  Did not appreciate any rectal/gluteal abscess.  After Julien Nordmann advised the patient to keep the area clean and dry.  If the patient continues to have worsening symptoms in this area, advised him to follow-up with his primary care provider.  Review constipation education with the patient.  Advised him to take a stool softener upon returning home today.  The patient's  creatinine is slightly elevated today.  We will arrange for the patient to receive 1 L of fluid over 2 hours while in the infusion room today.  The patient was advised to call immediately if she has any concerning symptoms in the interval. The patient voices understanding of current disease status and treatment options  and is in agreement with the current care plan. All questions were answered. The patient knows to call the clinic with any problems, questions or concerns. We can certainly see the patient much sooner if necessary   [name]  PA-C 02/18/21  ADDENDUM: Hematology/Oncology Attending: I had a face-to-face encounter with the patient today.  I reviewed his record, lab, scan and recommended his care plan.  This is a very pleasant 65 years old white male with stage IV non-small cell lung cancer, adenocarcinoma with positive KRAS G12C mutation diagnosed and July 2022.  The patient received 1 cycle of systemic chemotherapy with carboplatin, paclitaxel and Keytruda followed by treatment with 4 cycles of carboplatin, Alimta and Keytruda every 3 weeks followed by maintenance Alimta and Keytruda then Alimta was discontinued starting from cycle #6 secondary to pancytopenia requiring hospitalization.  After his histology was highly suspicious to be adenocarcinoma.  He has been tolerating this treatment well but he was admitted to the hospital recently with pancytopenia and rectal bleeding.  He received 2 units of PRBCs transfusion as well as platelet transfusion during his hospitalization.  He was also found to have cholelithiasis on imaging studies and he underwent ERCP with removal of some of the stone by gastroenterology. He had repeat CT scan of the chest, abdomen pelvis during his hospitalization.  I personally and independently reviewed the scans and discussed the results with the patient today. His scan showed no concerning findings for disease progression. I recommended for the  patient to continue his current treatment with single agent Keytruda for now. For the skin break on the perennial area, we advised the patient to continue with cleaning and keeping it dry as much as he can. He was advised to call immediately if he has any other concerning symptoms in the interval. The total time spent in the appointment was 30 minutes.  Disclaimer: This note was dictated with voice recognition software. Similar sounding words can inadvertently be transcribed and may be missed upon review. Eilleen Kempf, MD 02/18/21

## 2021-02-17 ENCOUNTER — Inpatient Hospital Stay: Payer: PPO

## 2021-02-18 ENCOUNTER — Inpatient Hospital Stay: Payer: PPO

## 2021-02-18 ENCOUNTER — Other Ambulatory Visit: Payer: Self-pay

## 2021-02-18 ENCOUNTER — Encounter: Payer: Self-pay | Admitting: Physician Assistant

## 2021-02-18 ENCOUNTER — Inpatient Hospital Stay: Payer: PPO | Attending: Physician Assistant

## 2021-02-18 ENCOUNTER — Inpatient Hospital Stay (HOSPITAL_BASED_OUTPATIENT_CLINIC_OR_DEPARTMENT_OTHER): Payer: PPO | Admitting: Physician Assistant

## 2021-02-18 VITALS — BP 106/74 | HR 97 | Temp 97.7°F | Resp 18 | Ht 68.0 in | Wt 210.4 lb

## 2021-02-18 DIAGNOSIS — C3491 Malignant neoplasm of unspecified part of right bronchus or lung: Secondary | ICD-10-CM

## 2021-02-18 DIAGNOSIS — R7989 Other specified abnormal findings of blood chemistry: Secondary | ICD-10-CM | POA: Diagnosis not present

## 2021-02-18 DIAGNOSIS — Z452 Encounter for adjustment and management of vascular access device: Secondary | ICD-10-CM | POA: Insufficient documentation

## 2021-02-18 DIAGNOSIS — D61818 Other pancytopenia: Secondary | ICD-10-CM

## 2021-02-18 DIAGNOSIS — C3431 Malignant neoplasm of lower lobe, right bronchus or lung: Secondary | ICD-10-CM | POA: Diagnosis not present

## 2021-02-18 DIAGNOSIS — Z79899 Other long term (current) drug therapy: Secondary | ICD-10-CM | POA: Diagnosis not present

## 2021-02-18 DIAGNOSIS — C7972 Secondary malignant neoplasm of left adrenal gland: Secondary | ICD-10-CM | POA: Diagnosis not present

## 2021-02-18 DIAGNOSIS — Z5112 Encounter for antineoplastic immunotherapy: Secondary | ICD-10-CM | POA: Diagnosis not present

## 2021-02-18 DIAGNOSIS — D649 Anemia, unspecified: Secondary | ICD-10-CM

## 2021-02-18 DIAGNOSIS — D696 Thrombocytopenia, unspecified: Secondary | ICD-10-CM

## 2021-02-18 LAB — CBC WITH DIFFERENTIAL (CANCER CENTER ONLY)
Abs Immature Granulocytes: 0.03 10*3/uL (ref 0.00–0.07)
Basophils Absolute: 0 10*3/uL (ref 0.0–0.1)
Basophils Relative: 0 %
Eosinophils Absolute: 0.3 10*3/uL (ref 0.0–0.5)
Eosinophils Relative: 6 %
HCT: 29.6 % — ABNORMAL LOW (ref 39.0–52.0)
Hemoglobin: 10 g/dL — ABNORMAL LOW (ref 13.0–17.0)
Immature Granulocytes: 1 %
Lymphocytes Relative: 19 %
Lymphs Abs: 1.1 10*3/uL (ref 0.7–4.0)
MCH: 31.8 pg (ref 26.0–34.0)
MCHC: 33.8 g/dL (ref 30.0–36.0)
MCV: 94.3 fL (ref 80.0–100.0)
Monocytes Absolute: 1.2 10*3/uL — ABNORMAL HIGH (ref 0.1–1.0)
Monocytes Relative: 21 %
Neutro Abs: 3 10*3/uL (ref 1.7–7.7)
Neutrophils Relative %: 53 %
Platelet Count: 339 10*3/uL (ref 150–400)
RBC: 3.14 MIL/uL — ABNORMAL LOW (ref 4.22–5.81)
RDW: 19.4 % — ABNORMAL HIGH (ref 11.5–15.5)
WBC Count: 5.7 10*3/uL (ref 4.0–10.5)
nRBC: 0 % (ref 0.0–0.2)

## 2021-02-18 LAB — CMP (CANCER CENTER ONLY)
ALT: 45 U/L — ABNORMAL HIGH (ref 0–44)
AST: 33 U/L (ref 15–41)
Albumin: 3.8 g/dL (ref 3.5–5.0)
Alkaline Phosphatase: 89 U/L (ref 38–126)
Anion gap: 13 (ref 5–15)
BUN: 15 mg/dL (ref 8–23)
CO2: 28 mmol/L (ref 22–32)
Calcium: 9.1 mg/dL (ref 8.9–10.3)
Chloride: 100 mmol/L (ref 98–111)
Creatinine: 1.54 mg/dL — ABNORMAL HIGH (ref 0.61–1.24)
GFR, Estimated: 50 mL/min — ABNORMAL LOW (ref >60)
Glucose, Bld: 120 mg/dL — ABNORMAL HIGH (ref 70–99)
Potassium: 3.3 mmol/L — ABNORMAL LOW (ref 3.5–5.1)
Sodium: 141 mmol/L (ref 135–145)
Total Bilirubin: 1.1 mg/dL (ref 0.3–1.2)
Total Protein: 7.5 g/dL (ref 6.5–8.1)

## 2021-02-18 LAB — TSH: TSH: 3.448 u[IU]/mL (ref 0.320–4.118)

## 2021-02-18 LAB — SAMPLE TO BLOOD BANK

## 2021-02-18 MED ORDER — PROCHLORPERAZINE MALEATE 10 MG PO TABS
10.0000 mg | ORAL_TABLET | Freq: Once | ORAL | Status: AC
  Administered 2021-02-18: 10 mg via ORAL
  Filled 2021-02-18: qty 1

## 2021-02-18 MED ORDER — CYANOCOBALAMIN 1000 MCG/ML IJ SOLN
1000.0000 ug | Freq: Once | INTRAMUSCULAR | Status: AC
  Administered 2021-02-18: 1000 ug via INTRAMUSCULAR
  Filled 2021-02-18: qty 1

## 2021-02-18 MED ORDER — SODIUM CHLORIDE 0.9 % IV SOLN: Freq: Once | INTRAVENOUS | Status: AC

## 2021-02-18 MED ORDER — HEPARIN SOD (PORK) LOCK FLUSH 100 UNIT/ML IV SOLN
500.0000 [IU] | Freq: Once | INTRAVENOUS | Status: AC | PRN
  Administered 2021-02-18: 500 [IU]

## 2021-02-18 MED ORDER — ACETAMINOPHEN 325 MG PO TABS
650.0000 mg | ORAL_TABLET | Freq: Once | ORAL | Status: AC
  Administered 2021-02-18: 650 mg via ORAL
  Filled 2021-02-18: qty 2

## 2021-02-18 MED ORDER — SODIUM CHLORIDE 0.9% FLUSH
10.0000 mL | INTRAVENOUS | Status: DC | PRN
  Administered 2021-02-18: 10 mL

## 2021-02-18 MED ORDER — SODIUM CHLORIDE 0.9 % IV SOLN
200.0000 mg | Freq: Once | INTRAVENOUS | Status: AC
  Administered 2021-02-18: 200 mg via INTRAVENOUS
  Filled 2021-02-18: qty 8

## 2021-02-18 NOTE — Patient Instructions (Signed)
[facility]   Discharge Instructions: Thank you for choosing [facility] to provide your oncology and hematology care.   If you have a lab appointment with the [facility], please go directly to the [facility] and check in at the registration area.   Wear comfortable clothing and clothing appropriate for easy access to any Portacath or PICC line.   We strive to give you quality time with your provider. You may need to reschedule your appointment if you arrive late (15 or more minutes).  Arriving late affects you and other patients whose appointments are after yours.  Also, if you miss three or more appointments without notifying the office, you may be dismissed from the clinic at the providers discretion.      For prescription refill requests, have your pharmacy contact our office and allow 72 hours for refills to be completed.    Today you received the following chemotherapy and/or immunotherapy agents: pembrolizumab.      To help prevent nausea and vomiting after your treatment, we encourage you to take your nausea medication as directed.  BELOW ARE SYMPTOMS THAT SHOULD BE REPORTED IMMEDIATELY: *FEVER GREATER THAN 100.4 F (38 C) OR HIGHER *CHILLS OR SWEATING *NAUSEA AND VOMITING THAT IS NOT CONTROLLED WITH YOUR NAUSEA MEDICATION *UNUSUAL SHORTNESS OF BREATH *UNUSUAL BRUISING OR BLEEDING *URINARY PROBLEMS (pain or burning when urinating, or frequent urination) *BOWEL PROBLEMS (unusual diarrhea, constipation, pain near the anus) TENDERNESS IN MOUTH AND THROAT WITH OR WITHOUT PRESENCE OF ULCERS (sore throat, sores in mouth, or a toothache) UNUSUAL RASH, SWELLING OR PAIN  UNUSUAL VAGINAL DISCHARGE OR ITCHING   Items with * indicate a potential emergency and should be followed up as soon as possible or go to the Emergency Department if any problems should occur.  Please show the CHEMOTHERAPY ALERT CARD or IMMUNOTHERAPY ALERT CARD at  check-in to the Emergency Department and triage nurse.  Should you have questions after your visit or need to cancel or reschedule your appointment, please contact [facility]  Dept: [phone]  and follow the prompts.  Office hours are 8:00 a.m. to 4:30 p.m. Monday - Friday. Please note that voicemails left after 4:00 p.m. may not be returned until the following business day.  We are closed weekends and major holidays. You have access to a nurse at all times for urgent questions. Please call the main number to the clinic Dept: [phone] and follow the prompts.   For any non-urgent questions, you may also contact your provider using MyChart. We now offer e-Visits for anyone 59 and older to request care online for non-urgent symptoms. For details visit [email or web address].   Also download the MyChart app! Go to the app store, search "MyChart", open the app, select [facility], and log in with your MyChart username and password.  Due to Covid, a mask is required upon entering the hospital/clinic. If you do not have a mask, one will be given to you upon arrival. For doctor visits, patients may have 1 support person aged 48 or older with them. For treatment visits, patients cannot have anyone with them due to current Covid guidelines and our immunocompromised population.

## 2021-02-22 ENCOUNTER — Other Ambulatory Visit: Payer: Self-pay

## 2021-02-24 ENCOUNTER — Inpatient Hospital Stay: Payer: PPO

## 2021-02-24 ENCOUNTER — Other Ambulatory Visit: Payer: Self-pay

## 2021-02-24 DIAGNOSIS — Z95828 Presence of other vascular implants and grafts: Secondary | ICD-10-CM

## 2021-02-24 DIAGNOSIS — Z5112 Encounter for antineoplastic immunotherapy: Secondary | ICD-10-CM | POA: Diagnosis not present

## 2021-02-24 DIAGNOSIS — C3491 Malignant neoplasm of unspecified part of right bronchus or lung: Secondary | ICD-10-CM

## 2021-02-24 LAB — CBC WITH DIFFERENTIAL (CANCER CENTER ONLY)
Abs Immature Granulocytes: 0.01 10*3/uL (ref 0.00–0.07)
Basophils Absolute: 0 10*3/uL (ref 0.0–0.1)
Basophils Relative: 0 %
Eosinophils Absolute: 0.2 10*3/uL (ref 0.0–0.5)
Eosinophils Relative: 4 %
HCT: 28.8 % — ABNORMAL LOW (ref 39.0–52.0)
Hemoglobin: 9.5 g/dL — ABNORMAL LOW (ref 13.0–17.0)
Immature Granulocytes: 0 %
Lymphocytes Relative: 19 %
Lymphs Abs: 0.9 10*3/uL (ref 0.7–4.0)
MCH: 32.1 pg (ref 26.0–34.0)
MCHC: 33 g/dL (ref 30.0–36.0)
MCV: 97.3 fL (ref 80.0–100.0)
Monocytes Absolute: 0.9 10*3/uL (ref 0.1–1.0)
Monocytes Relative: 19 %
Neutro Abs: 2.6 10*3/uL (ref 1.7–7.7)
Neutrophils Relative %: 58 %
Platelet Count: 251 10*3/uL (ref 150–400)
RBC: 2.96 MIL/uL — ABNORMAL LOW (ref 4.22–5.81)
RDW: 19.5 % — ABNORMAL HIGH (ref 11.5–15.5)
WBC Count: 4.6 10*3/uL (ref 4.0–10.5)
nRBC: 0 % (ref 0.0–0.2)

## 2021-02-24 LAB — SAMPLE TO BLOOD BANK

## 2021-02-24 LAB — CMP (CANCER CENTER ONLY)
ALT: 16 U/L (ref 0–44)
AST: 21 U/L (ref 15–41)
Albumin: 3.9 g/dL (ref 3.5–5.0)
Alkaline Phosphatase: 85 U/L (ref 38–126)
Anion gap: 9 (ref 5–15)
BUN: 12 mg/dL (ref 8–23)
CO2: 29 mmol/L (ref 22–32)
Calcium: 9.4 mg/dL (ref 8.9–10.3)
Chloride: 102 mmol/L (ref 98–111)
Creatinine: 1.46 mg/dL — ABNORMAL HIGH (ref 0.61–1.24)
GFR, Estimated: 53 mL/min — ABNORMAL LOW (ref >60)
Glucose, Bld: 138 mg/dL — ABNORMAL HIGH (ref 70–99)
Potassium: 3.2 mmol/L — ABNORMAL LOW (ref 3.5–5.1)
Sodium: 140 mmol/L (ref 135–145)
Total Bilirubin: 0.7 mg/dL (ref 0.3–1.2)
Total Protein: 7.2 g/dL (ref 6.5–8.1)

## 2021-02-24 MED ORDER — HEPARIN SOD (PORK) LOCK FLUSH 100 UNIT/ML IV SOLN
500.0000 [IU] | INTRAVENOUS | Status: AC | PRN
  Administered 2021-02-24: 12:00:00 500 [IU]

## 2021-02-24 MED ORDER — SODIUM CHLORIDE 0.9% FLUSH
10.0000 mL | INTRAVENOUS | Status: AC | PRN
  Administered 2021-02-24: 12:00:00 10 mL

## 2021-02-25 ENCOUNTER — Telehealth: Payer: Self-pay

## 2021-02-25 NOTE — Telephone Encounter (Signed)
Nutrition  Patient identified on Malnutrition Screening report for weight loss and poor appetite.  Chart reviewed.  Called sister (phone listed in chart) but no answer.  Left message with call back number.    Nutrition team scheduled to see patient on 1/25 during infusion.   Danny Yackley B. Zenia Resides, [place], [place] Registered Dietitian [phone] (mobile)

## 2021-03-01 ENCOUNTER — Other Ambulatory Visit: Payer: Self-pay | Admitting: Internal Medicine

## 2021-03-01 DIAGNOSIS — C3491 Malignant neoplasm of unspecified part of right bronchus or lung: Secondary | ICD-10-CM

## 2021-03-02 ENCOUNTER — Ambulatory Visit: Payer: PPO

## 2021-03-02 ENCOUNTER — Other Ambulatory Visit: Payer: Self-pay | Admitting: Medical Oncology

## 2021-03-02 DIAGNOSIS — C3491 Malignant neoplasm of unspecified part of right bronchus or lung: Secondary | ICD-10-CM

## 2021-03-03 ENCOUNTER — Inpatient Hospital Stay: Payer: PPO

## 2021-03-03 ENCOUNTER — Inpatient Hospital Stay: Payer: PPO | Admitting: Physician Assistant

## 2021-03-03 ENCOUNTER — Other Ambulatory Visit: Payer: Self-pay

## 2021-03-03 ENCOUNTER — Other Ambulatory Visit: Payer: PPO

## 2021-03-03 DIAGNOSIS — Z5112 Encounter for antineoplastic immunotherapy: Secondary | ICD-10-CM | POA: Diagnosis not present

## 2021-03-03 DIAGNOSIS — C3491 Malignant neoplasm of unspecified part of right bronchus or lung: Secondary | ICD-10-CM

## 2021-03-03 DIAGNOSIS — Z95828 Presence of other vascular implants and grafts: Secondary | ICD-10-CM

## 2021-03-03 LAB — CMP (CANCER CENTER ONLY)
ALT: 11 U/L (ref 0–44)
AST: 19 U/L (ref 15–41)
Albumin: 3.9 g/dL (ref 3.5–5.0)
Alkaline Phosphatase: 67 U/L (ref 38–126)
Anion gap: 8 (ref 5–15)
BUN: 11 mg/dL (ref 8–23)
CO2: 30 mmol/L (ref 22–32)
Calcium: 9.2 mg/dL (ref 8.9–10.3)
Chloride: 102 mmol/L (ref 98–111)
Creatinine: 1.51 mg/dL — ABNORMAL HIGH (ref 0.61–1.24)
GFR, Estimated: 51 mL/min — ABNORMAL LOW (ref >60)
Glucose, Bld: 100 mg/dL — ABNORMAL HIGH (ref 70–99)
Potassium: 3.5 mmol/L (ref 3.5–5.1)
Sodium: 140 mmol/L (ref 135–145)
Total Bilirubin: 0.6 mg/dL (ref 0.3–1.2)
Total Protein: 7.3 g/dL (ref 6.5–8.1)

## 2021-03-03 LAB — CBC WITH DIFFERENTIAL (CANCER CENTER ONLY)
Abs Immature Granulocytes: 0.02 10*3/uL (ref 0.00–0.07)
Basophils Absolute: 0 10*3/uL (ref 0.0–0.1)
Basophils Relative: 0 %
Eosinophils Absolute: 0.2 10*3/uL (ref 0.0–0.5)
Eosinophils Relative: 4 %
HCT: 28.2 % — ABNORMAL LOW (ref 39.0–52.0)
Hemoglobin: 9.1 g/dL — ABNORMAL LOW (ref 13.0–17.0)
Immature Granulocytes: 0 %
Lymphocytes Relative: 21 %
Lymphs Abs: 1.2 10*3/uL (ref 0.7–4.0)
MCH: 31.9 pg (ref 26.0–34.0)
MCHC: 32.3 g/dL (ref 30.0–36.0)
MCV: 98.9 fL (ref 80.0–100.0)
Monocytes Absolute: 0.9 10*3/uL (ref 0.1–1.0)
Monocytes Relative: 15 %
Neutro Abs: 3.4 10*3/uL (ref 1.7–7.7)
Neutrophils Relative %: 60 %
Platelet Count: 182 10*3/uL (ref 150–400)
RBC: 2.85 MIL/uL — ABNORMAL LOW (ref 4.22–5.81)
RDW: 18.7 % — ABNORMAL HIGH (ref 11.5–15.5)
WBC Count: 5.7 10*3/uL (ref 4.0–10.5)
nRBC: 0 % (ref 0.0–0.2)

## 2021-03-03 MED ORDER — HEPARIN SOD (PORK) LOCK FLUSH 100 UNIT/ML IV SOLN
500.0000 [IU] | INTRAVENOUS | Status: AC | PRN
  Administered 2021-03-03: 12:00:00 500 [IU]

## 2021-03-03 MED ORDER — SODIUM CHLORIDE 0.9% FLUSH
10.0000 mL | INTRAVENOUS | Status: AC | PRN
  Administered 2021-03-03: 12:00:00 10 mL

## 2021-03-09 ENCOUNTER — Inpatient Hospital Stay: Payer: PPO | Attending: Physician Assistant

## 2021-03-09 ENCOUNTER — Other Ambulatory Visit: Payer: Self-pay | Admitting: Internal Medicine

## 2021-03-09 DIAGNOSIS — C3431 Malignant neoplasm of lower lobe, right bronchus or lung: Secondary | ICD-10-CM | POA: Insufficient documentation

## 2021-03-09 DIAGNOSIS — Z79899 Other long term (current) drug therapy: Secondary | ICD-10-CM | POA: Insufficient documentation

## 2021-03-09 DIAGNOSIS — C7972 Secondary malignant neoplasm of left adrenal gland: Secondary | ICD-10-CM | POA: Insufficient documentation

## 2021-03-09 DIAGNOSIS — Z5112 Encounter for antineoplastic immunotherapy: Secondary | ICD-10-CM | POA: Insufficient documentation

## 2021-03-09 DIAGNOSIS — C3491 Malignant neoplasm of unspecified part of right bronchus or lung: Secondary | ICD-10-CM

## 2021-03-09 DIAGNOSIS — K5900 Constipation, unspecified: Secondary | ICD-10-CM | POA: Insufficient documentation

## 2021-03-09 NOTE — Progress Notes (Signed)
Nutrition Assessment   Reason for Assessment:  Patient identified on Malnutrition Screening report for weight loss.    ASSESSMENT:  66 year old male with stage IV NSC lung cancer.  Past medical history of CAD, HTN, CHF, COPD, recent hospitalization for choledocholithasis.  Patient was previously on chemotherapy.  Now currently on keytruda alone due to intolerance.  [name], patient's sister called RD back.  Sister says that patient's appetite has improved. Some days he might not eat as much as on other days.  Patient received Meals on Wheels. Sister buys groceries for and patient.  Patient unable to cook and mostly eats frozen meals, canned foods.  Likes cheese and crackers, peanut butter and crackers, canned fruit, pudding if not chocolate. Drinks juice, tea, milk that Meals on Wheels provides.  Does not drink oral nutrition supplements because he does not like them.  Sister says that prior to hospitalization had issues with nausea, diarrhea, poor appetite.  Says that he is not complaining about nausea or diarrhea currently.     Medications: reviewed   Labs: reviewed   Anthropometrics:   Height: 68 inches Weight: 210 lb 6.4 oz 12/15 223-229 lb over the last few months BMI: 31  8% weight loss in month, significant (during hospital admission)   Estimated Energy Needs  Kcals: 2375-2850 Protein: 114-142 g Fluid: 2.3 L   NUTRITION DIAGNOSIS: Inadequate oral intake related to altered GI function (gallstones, nausea, diarrhea) as evidenced by 8% weight loss in 1 month and poor po intake now improved   INTERVENTION:  Encouraged sister to continue to provide foods that patient likes and will eat.   Discussed foods high in protein that patient likes (chicken, peanut butter, cheese when not constipated, milk)and encouraged these in diet.  Contact information provided   MONITORING, EVALUATION, GOAL: weight trends, intake   Next Visit: Wednesday, Jan 25 during infusion with  Fritz Pickerel B. Zenia Resides, [place], [place] Registered Dietitian [phone] (mobile)

## 2021-03-10 ENCOUNTER — Inpatient Hospital Stay: Payer: PPO

## 2021-03-10 ENCOUNTER — Encounter: Payer: Self-pay | Admitting: Internal Medicine

## 2021-03-10 ENCOUNTER — Other Ambulatory Visit: Payer: Self-pay

## 2021-03-10 ENCOUNTER — Other Ambulatory Visit: Payer: Self-pay | Admitting: Internal Medicine

## 2021-03-10 ENCOUNTER — Inpatient Hospital Stay (HOSPITAL_BASED_OUTPATIENT_CLINIC_OR_DEPARTMENT_OTHER): Payer: PPO | Admitting: Internal Medicine

## 2021-03-10 ENCOUNTER — Other Ambulatory Visit: Payer: PPO

## 2021-03-10 VITALS — BP 133/56 | HR 87 | Temp 97.8°F | Resp 19 | Ht 68.0 in | Wt 222.2 lb

## 2021-03-10 DIAGNOSIS — D696 Thrombocytopenia, unspecified: Secondary | ICD-10-CM

## 2021-03-10 DIAGNOSIS — Z5112 Encounter for antineoplastic immunotherapy: Secondary | ICD-10-CM

## 2021-03-10 DIAGNOSIS — Z95828 Presence of other vascular implants and grafts: Secondary | ICD-10-CM

## 2021-03-10 DIAGNOSIS — K5900 Constipation, unspecified: Secondary | ICD-10-CM | POA: Diagnosis not present

## 2021-03-10 DIAGNOSIS — C3491 Malignant neoplasm of unspecified part of right bronchus or lung: Secondary | ICD-10-CM

## 2021-03-10 DIAGNOSIS — Z5111 Encounter for antineoplastic chemotherapy: Secondary | ICD-10-CM

## 2021-03-10 DIAGNOSIS — C3431 Malignant neoplasm of lower lobe, right bronchus or lung: Secondary | ICD-10-CM | POA: Diagnosis not present

## 2021-03-10 DIAGNOSIS — Z79899 Other long term (current) drug therapy: Secondary | ICD-10-CM | POA: Diagnosis not present

## 2021-03-10 DIAGNOSIS — C7972 Secondary malignant neoplasm of left adrenal gland: Secondary | ICD-10-CM | POA: Diagnosis not present

## 2021-03-10 LAB — TSH: TSH: 1.811 u[IU]/mL (ref 0.320–4.118)

## 2021-03-10 LAB — CMP (CANCER CENTER ONLY)
ALT: 10 U/L (ref 0–44)
AST: 18 U/L (ref 15–41)
Albumin: 3.9 g/dL (ref 3.5–5.0)
Alkaline Phosphatase: 70 U/L (ref 38–126)
Anion gap: 8 (ref 5–15)
BUN: 19 mg/dL (ref 8–23)
CO2: 30 mmol/L (ref 22–32)
Calcium: 9.2 mg/dL (ref 8.9–10.3)
Chloride: 101 mmol/L (ref 98–111)
Creatinine: 1.56 mg/dL — ABNORMAL HIGH (ref 0.61–1.24)
GFR, Estimated: 49 mL/min — ABNORMAL LOW (ref >60)
Glucose, Bld: 95 mg/dL (ref 70–99)
Potassium: 3.5 mmol/L (ref 3.5–5.1)
Sodium: 139 mmol/L (ref 135–145)
Total Bilirubin: 0.5 mg/dL (ref 0.3–1.2)
Total Protein: 7 g/dL (ref 6.5–8.1)

## 2021-03-10 LAB — CBC WITH DIFFERENTIAL (CANCER CENTER ONLY)
Abs Immature Granulocytes: 0.02 10*3/uL (ref 0.00–0.07)
Basophils Absolute: 0 10*3/uL (ref 0.0–0.1)
Basophils Relative: 1 %
Eosinophils Absolute: 0.4 10*3/uL (ref 0.0–0.5)
Eosinophils Relative: 10 %
HCT: 26.8 % — ABNORMAL LOW (ref 39.0–52.0)
Hemoglobin: 8.8 g/dL — ABNORMAL LOW (ref 13.0–17.0)
Immature Granulocytes: 1 %
Lymphocytes Relative: 27 %
Lymphs Abs: 1.2 10*3/uL (ref 0.7–4.0)
MCH: 32.6 pg (ref 26.0–34.0)
MCHC: 32.8 g/dL (ref 30.0–36.0)
MCV: 99.3 fL (ref 80.0–100.0)
Monocytes Absolute: 0.5 10*3/uL (ref 0.1–1.0)
Monocytes Relative: 12 %
Neutro Abs: 2.2 10*3/uL (ref 1.7–7.7)
Neutrophils Relative %: 49 %
Platelet Count: 190 10*3/uL (ref 150–400)
RBC: 2.7 MIL/uL — ABNORMAL LOW (ref 4.22–5.81)
RDW: 17.6 % — ABNORMAL HIGH (ref 11.5–15.5)
WBC Count: 4.4 10*3/uL (ref 4.0–10.5)
nRBC: 0 % (ref 0.0–0.2)

## 2021-03-10 MED ORDER — SODIUM CHLORIDE 0.9% FLUSH
10.0000 mL | INTRAVENOUS | Status: AC | PRN
  Administered 2021-03-10: 10 mL

## 2021-03-10 MED ORDER — ACETAMINOPHEN 500 MG PO TABS
500.0000 mg | ORAL_TABLET | Freq: Once | ORAL | Status: AC
  Administered 2021-03-10: 500 mg via ORAL
  Filled 2021-03-10: qty 1

## 2021-03-10 MED ORDER — PROCHLORPERAZINE MALEATE 10 MG PO TABS
10.0000 mg | ORAL_TABLET | Freq: Once | ORAL | Status: AC
  Administered 2021-03-10: 10 mg via ORAL
  Filled 2021-03-10: qty 1

## 2021-03-10 MED ORDER — SODIUM CHLORIDE 0.9 % IV SOLN: Freq: Once | INTRAVENOUS | Status: AC

## 2021-03-10 MED ORDER — SODIUM CHLORIDE 0.9% FLUSH
10.0000 mL | INTRAVENOUS | Status: DC | PRN
  Administered 2021-03-10: 10 mL

## 2021-03-10 MED ORDER — SODIUM CHLORIDE 0.9 % IV SOLN: 400.0000 mg/m2 | Freq: Once | INTRAVENOUS | Status: DC

## 2021-03-10 MED ORDER — SODIUM CHLORIDE 0.9 % IV SOLN
200.0000 mg | Freq: Once | INTRAVENOUS | Status: AC
  Administered 2021-03-10: 200 mg via INTRAVENOUS
  Filled 2021-03-10: qty 8

## 2021-03-10 MED ORDER — HEPARIN SOD (PORK) LOCK FLUSH 100 UNIT/ML IV SOLN
500.0000 [IU] | Freq: Once | INTRAVENOUS | Status: AC | PRN
  Administered 2021-03-10: 500 [IU]

## 2021-03-10 NOTE — Progress Notes (Signed)
Per Dr. Julien Nordmann - okay to treat with elevated creatinine

## 2021-03-10 NOTE — Progress Notes (Signed)
OK to treat with elevated creatinine per Dr Julien Nordmann today.

## 2021-03-10 NOTE — Patient Instructions (Signed)
[facility]   Discharge Instructions: Thank you for choosing [facility] to provide your oncology and hematology care.   If you have a lab appointment with the [facility], please go directly to the [facility] and check in at the registration area.   Wear comfortable clothing and clothing appropriate for easy access to any Portacath or PICC line.   We strive to give you quality time with your provider. You may need to reschedule your appointment if you arrive late (15 or more minutes).  Arriving late affects you and other patients whose appointments are after yours.  Also, if you miss three or more appointments without notifying the office, you may be dismissed from the clinic at the providers discretion.      For prescription refill requests, have your pharmacy contact our office and allow 72 hours for refills to be completed.    Today you received the following chemotherapy and/or immunotherapy agents: Pembrolizumab Beryle Flock)      To help prevent nausea and vomiting after your treatment, we encourage you to take your nausea medication as directed.  BELOW ARE SYMPTOMS THAT SHOULD BE REPORTED IMMEDIATELY: *FEVER GREATER THAN 100.4 F (38 C) OR HIGHER *CHILLS OR SWEATING *NAUSEA AND VOMITING THAT IS NOT CONTROLLED WITH YOUR NAUSEA MEDICATION *UNUSUAL SHORTNESS OF BREATH *UNUSUAL BRUISING OR BLEEDING *URINARY PROBLEMS (pain or burning when urinating, or frequent urination) *BOWEL PROBLEMS (unusual diarrhea, constipation, pain near the anus) TENDERNESS IN MOUTH AND THROAT WITH OR WITHOUT PRESENCE OF ULCERS (sore throat, sores in mouth, or a toothache) UNUSUAL RASH, SWELLING OR PAIN  UNUSUAL VAGINAL DISCHARGE OR ITCHING   Items with * indicate a potential emergency and should be followed up as soon as possible or go to the Emergency Department if any problems should occur.  Please show the CHEMOTHERAPY ALERT CARD or IMMUNOTHERAPY ALERT  CARD at check-in to the Emergency Department and triage nurse.  Should you have questions after your visit or need to cancel or reschedule your appointment, please contact [facility]  Dept: [phone]  and follow the prompts.  Office hours are 8:00 a.m. to 4:30 p.m. Monday - Friday. Please note that voicemails left after 4:00 p.m. may not be returned until the following business day.  We are closed weekends and major holidays. You have access to a nurse at all times for urgent questions. Please call the main number to the clinic Dept: [phone] and follow the prompts.   For any non-urgent questions, you may also contact your provider using MyChart. We now offer e-Visits for anyone 7 and older to request care online for non-urgent symptoms. For details visit [email or web address].   Also download the MyChart app! Go to the app store, search "MyChart", open the app, select [facility], and log in with your MyChart username and password.  Due to Covid, a mask is required upon entering the hospital/clinic. If you do not have a mask, one will be given to you upon arrival. For doctor visits, patients may have 1 support person aged 66 or older with them. For treatment visits, patients cannot have anyone with them due to current Covid guidelines and our immunocompromised population.
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
## 2021-03-11 ENCOUNTER — Other Ambulatory Visit: Payer: PPO

## 2021-03-11 ENCOUNTER — Ambulatory Visit: Payer: PPO | Admitting: Internal Medicine

## 2021-03-11 ENCOUNTER — Ambulatory Visit: Payer: PPO

## 2021-03-17 ENCOUNTER — Encounter: Payer: Self-pay | Admitting: Internal Medicine

## 2021-03-17 ENCOUNTER — Inpatient Hospital Stay: Payer: PPO

## 2021-03-17 ENCOUNTER — Encounter: Payer: Self-pay | Admitting: Physician Assistant

## 2021-03-17 ENCOUNTER — Other Ambulatory Visit: Payer: Self-pay

## 2021-03-17 DIAGNOSIS — G894 Chronic pain syndrome: Secondary | ICD-10-CM | POA: Diagnosis not present

## 2021-03-17 DIAGNOSIS — Z79891 Long term (current) use of opiate analgesic: Secondary | ICD-10-CM | POA: Diagnosis not present

## 2021-03-17 DIAGNOSIS — G893 Neoplasm related pain (acute) (chronic): Secondary | ICD-10-CM | POA: Diagnosis not present

## 2021-03-17 DIAGNOSIS — M150 Primary generalized (osteo)arthritis: Secondary | ICD-10-CM | POA: Diagnosis not present

## 2021-03-17 MED ORDER — HEPARIN SOD (PORK) LOCK FLUSH 100 UNIT/ML IV SOLN: 500.0000 [IU] | INTRAVENOUS | Status: DC | PRN

## 2021-03-17 MED ORDER — SODIUM CHLORIDE 0.9% FLUSH: 10.0000 mL | INTRAVENOUS | Status: DC | PRN

## 2021-03-17 NOTE — Progress Notes (Signed)
Patient came in for port flush with lab appointment.  He recently received chemo on 03/10/2021 with lab work.  Informed Diane, RN and she said he was changed from weekly labs to every 3 weeks with chemo since he is only on Keytruda now. Patient & wife verbalized understanding.

## 2021-03-19 DIAGNOSIS — Z7951 Long term (current) use of inhaled steroids: Secondary | ICD-10-CM | POA: Diagnosis not present

## 2021-03-19 DIAGNOSIS — I10 Essential (primary) hypertension: Secondary | ICD-10-CM | POA: Diagnosis not present

## 2021-03-19 DIAGNOSIS — J449 Chronic obstructive pulmonary disease, unspecified: Secondary | ICD-10-CM | POA: Diagnosis not present

## 2021-03-19 DIAGNOSIS — Z87891 Personal history of nicotine dependence: Secondary | ICD-10-CM | POA: Diagnosis not present

## 2021-03-24 ENCOUNTER — Other Ambulatory Visit: Payer: PPO

## 2021-03-24 ENCOUNTER — Ambulatory Visit: Payer: PPO | Admitting: Internal Medicine

## 2021-03-24 ENCOUNTER — Ambulatory Visit: Payer: PPO

## 2021-03-24 ENCOUNTER — Inpatient Hospital Stay: Payer: PPO

## 2021-03-31 ENCOUNTER — Inpatient Hospital Stay: Payer: PPO

## 2021-03-31 ENCOUNTER — Inpatient Hospital Stay (HOSPITAL_BASED_OUTPATIENT_CLINIC_OR_DEPARTMENT_OTHER): Payer: PPO | Admitting: Internal Medicine

## 2021-03-31 ENCOUNTER — Encounter: Payer: Self-pay | Admitting: Internal Medicine

## 2021-03-31 ENCOUNTER — Inpatient Hospital Stay: Payer: PPO | Admitting: Dietician

## 2021-03-31 ENCOUNTER — Other Ambulatory Visit: Payer: Self-pay

## 2021-03-31 VITALS — BP 124/55 | HR 79 | Temp 97.5°F | Resp 20 | Ht 68.0 in | Wt 220.5 lb

## 2021-03-31 DIAGNOSIS — C3491 Malignant neoplasm of unspecified part of right bronchus or lung: Secondary | ICD-10-CM

## 2021-03-31 DIAGNOSIS — C3490 Malignant neoplasm of unspecified part of unspecified bronchus or lung: Secondary | ICD-10-CM

## 2021-03-31 DIAGNOSIS — Z95828 Presence of other vascular implants and grafts: Secondary | ICD-10-CM

## 2021-03-31 DIAGNOSIS — Z5112 Encounter for antineoplastic immunotherapy: Secondary | ICD-10-CM | POA: Diagnosis not present

## 2021-03-31 DIAGNOSIS — D696 Thrombocytopenia, unspecified: Secondary | ICD-10-CM

## 2021-03-31 LAB — CMP (CANCER CENTER ONLY)
ALT: 15 U/L (ref 0–44)
AST: 19 U/L (ref 15–41)
Albumin: 4.1 g/dL (ref 3.5–5.0)
Alkaline Phosphatase: 82 U/L (ref 38–126)
Anion gap: 6 (ref 5–15)
BUN: 12 mg/dL (ref 8–23)
CO2: 29 mmol/L (ref 22–32)
Calcium: 9.5 mg/dL (ref 8.9–10.3)
Chloride: 105 mmol/L (ref 98–111)
Creatinine: 1.22 mg/dL (ref 0.61–1.24)
GFR, Estimated: >60 mL/min (ref >60)
Glucose, Bld: 104 mg/dL — ABNORMAL HIGH (ref 70–99)
Potassium: 4.2 mmol/L (ref 3.5–5.1)
Sodium: 140 mmol/L (ref 135–145)
Total Bilirubin: 0.5 mg/dL (ref 0.3–1.2)
Total Protein: 7.1 g/dL (ref 6.5–8.1)

## 2021-03-31 LAB — CBC WITH DIFFERENTIAL (CANCER CENTER ONLY)
Abs Immature Granulocytes: 0.01 10*3/uL (ref 0.00–0.07)
Basophils Absolute: 0 10*3/uL (ref 0.0–0.1)
Basophils Relative: 0 %
Eosinophils Absolute: 0.5 10*3/uL (ref 0.0–0.5)
Eosinophils Relative: 11 %
HCT: 30.5 % — ABNORMAL LOW (ref 39.0–52.0)
Hemoglobin: 10.4 g/dL — ABNORMAL LOW (ref 13.0–17.0)
Immature Granulocytes: 0 %
Lymphocytes Relative: 25 %
Lymphs Abs: 1.2 10*3/uL (ref 0.7–4.0)
MCH: 33.4 pg (ref 26.0–34.0)
MCHC: 34.1 g/dL (ref 30.0–36.0)
MCV: 98.1 fL (ref 80.0–100.0)
Monocytes Absolute: 0.6 10*3/uL (ref 0.1–1.0)
Monocytes Relative: 12 %
Neutro Abs: 2.5 10*3/uL (ref 1.7–7.7)
Neutrophils Relative %: 52 %
Platelet Count: 170 10*3/uL (ref 150–400)
RBC: 3.11 MIL/uL — ABNORMAL LOW (ref 4.22–5.81)
RDW: 14 % (ref 11.5–15.5)
WBC Count: 4.8 10*3/uL (ref 4.0–10.5)
nRBC: 0 % (ref 0.0–0.2)

## 2021-03-31 LAB — TSH: TSH: 1.538 u[IU]/mL (ref 0.320–4.118)

## 2021-03-31 MED ORDER — HEPARIN SOD (PORK) LOCK FLUSH 100 UNIT/ML IV SOLN
500.0000 [IU] | Freq: Once | INTRAVENOUS | Status: AC | PRN
  Administered 2021-03-31: 12:00:00 500 [IU]

## 2021-03-31 MED ORDER — SODIUM CHLORIDE 0.9% FLUSH
10.0000 mL | INTRAVENOUS | Status: AC | PRN
  Administered 2021-03-31: 10:00:00 10 mL

## 2021-03-31 MED ORDER — SODIUM CHLORIDE 0.9 % IV SOLN: 25.0000 mg | Freq: Once | INTRAVENOUS | Status: DC

## 2021-03-31 MED ORDER — SODIUM CHLORIDE 0.9 % IV SOLN: Freq: Once | INTRAVENOUS | Status: AC

## 2021-03-31 MED ORDER — SODIUM CHLORIDE 0.9 % IV SOLN
200.0000 mg | Freq: Once | INTRAVENOUS | Status: AC
  Administered 2021-03-31: 11:00:00 200 mg via INTRAVENOUS
  Filled 2021-03-31: qty 200

## 2021-03-31 MED ORDER — HYDROCORTISONE ACETATE 2.5 % EX CREA: TOPICAL_CREAM | CUTANEOUS | 1 refills | Status: DC

## 2021-03-31 MED ORDER — DIPHENHYDRAMINE HCL 50 MG/ML IJ SOLN
25.0000 mg | Freq: Once | INTRAMUSCULAR | Status: AC
  Administered 2021-03-31: 11:00:00 25 mg via INTRAVENOUS
  Filled 2021-03-31: qty 1

## 2021-03-31 MED ORDER — SODIUM CHLORIDE 0.9% FLUSH
10.0000 mL | INTRAVENOUS | Status: DC | PRN
  Administered 2021-03-31: 12:00:00 10 mL

## 2021-03-31 NOTE — Progress Notes (Signed)
Per Julien Nordmann MD, pt will receive Keytruda today despite continued itching and rash on both arms.

## 2021-03-31 NOTE — Patient Instructions (Signed)
[facility]  Discharge Instructions: ?Thank you for choosing [facility] to provide your oncology and hematology care.  ? ?If you have a lab appointment with the Cancer Center, please go directly to the Cancer Center and check in at the registration area. ?  ?Wear comfortable clothing and clothing appropriate for easy access to any Portacath or PICC line.  ? ?We strive to give you quality time with your provider. You may need to reschedule your appointment if you arrive late (15 or more minutes).  Arriving late affects you and other patients whose appointments are after yours.  Also, if you miss three or more appointments without notifying the office, you may be dismissed from the clinic at the provider?s discretion.    ?  ?For prescription refill requests, have your pharmacy contact our office and allow 72 hours for refills to be completed.   ? ?Today you received the following chemotherapy and/or immunotherapy agents: Keytruda ?  ?To help prevent nausea and vomiting after your treatment, we encourage you to take your nausea medication as directed. ? ?BELOW ARE SYMPTOMS THAT SHOULD BE REPORTED IMMEDIATELY: ?*FEVER GREATER THAN 100.4 F (38 ?C) OR HIGHER ?*CHILLS OR SWEATING ?*NAUSEA AND VOMITING THAT IS NOT CONTROLLED WITH YOUR NAUSEA MEDICATION ?*UNUSUAL SHORTNESS OF BREATH ?*UNUSUAL BRUISING OR BLEEDING ?*URINARY PROBLEMS (pain or burning when urinating, or frequent urination) ?*BOWEL PROBLEMS (unusual diarrhea, constipation, pain near the anus) ?TENDERNESS IN MOUTH AND THROAT WITH OR WITHOUT PRESENCE OF ULCERS (sore throat, sores in mouth, or a toothache) ?UNUSUAL RASH, SWELLING OR PAIN  ?UNUSUAL VAGINAL DISCHARGE OR ITCHING  ? ?Items with * indicate a potential emergency and should be followed up as soon as possible or go to the Emergency Department if any problems should occur. ? ?Please show the CHEMOTHERAPY ALERT CARD or IMMUNOTHERAPY ALERT CARD at check-in to the  Emergency Department and triage nurse. ? ?Should you have questions after your visit or need to cancel or reschedule your appointment, please contact [facility]  Dept: [phone]  and follow the prompts.  Office hours are 8:00 a.m. to 4:30 p.m. Monday - Friday. Please note that voicemails left after 4:00 p.m. may not be returned until the following business day.  We are closed weekends and major holidays. You have access to a nurse at all times for urgent questions. Please call the main number to the clinic Dept: [phone] and follow the prompts. ? ? ?For any non-urgent questions, you may also contact your provider using MyChart. We now offer e-Visits for anyone 18 and older to request care online for non-urgent symptoms. For details visit [email or web address]. ?  ?Also download the MyChart app! Go to the app store, search "MyChart", open the app, select [facility], and log in with your MyChart username and password. ? ?Due to Covid, a mask is required upon entering the hospital/clinic. If you do not have a mask, one will be given to you upon arrival. For doctor visits, patients may have 1 support person aged 18 or older with them. For treatment visits, patients cannot have anyone with them due to current Covid guidelines and our immunocompromised population.  ? ?

## 2021-03-31 NOTE — Progress Notes (Signed)
Nutrition Follow-up:  Patient with stage IV NSCLC. He is currently receiving keytruda  Met with patient during infusion. He reports appetite is "too good." Patient is eating a variety of foods. Patient reports having the "munchies without smoking marijuana" states he could not stop eating yesterday. Patient recalls 4 TV dinners, meal brought from [employer], couple of oranges, italian ice, 3 square cakes, milk, gatorade, 3-4 bottles of water. Patient reports occasional nausea and vomiting. This has not happened recently. He denies diarrhea. Patient takes daily stool softener for constipation. Patient reports he takes narcotics for pain, bowels move every 2-3 days.     Medications: reviewed   Labs: glucose   Anthropometrics: Weight 220 lb 8 oz today  1/4 - 222 lb 3.2 oz  12/15 - 210 lb 6.4 oz    NUTRITION DIAGNOSIS: Inadequate oral intake improved    INTERVENTION:  Continue eating high calorie high protein foods for weight maintenance     MONITORING, EVALUATION, GOAL: weight trends, intake    NEXT VISIT: To be scheduled with treatment as needed
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
## 2021-04-01 ENCOUNTER — Ambulatory Visit: Payer: PPO | Admitting: Internal Medicine

## 2021-04-01 ENCOUNTER — Other Ambulatory Visit: Payer: PPO

## 2021-04-01 ENCOUNTER — Ambulatory Visit: Payer: PPO

## 2021-04-05 ENCOUNTER — Other Ambulatory Visit: Payer: Self-pay | Admitting: Medical Oncology

## 2021-04-07 ENCOUNTER — Inpatient Hospital Stay: Payer: PPO | Attending: Physician Assistant

## 2021-04-07 DIAGNOSIS — Z452 Encounter for adjustment and management of vascular access device: Secondary | ICD-10-CM | POA: Insufficient documentation

## 2021-04-07 DIAGNOSIS — C3431 Malignant neoplasm of lower lobe, right bronchus or lung: Secondary | ICD-10-CM | POA: Insufficient documentation

## 2021-04-07 DIAGNOSIS — Z79899 Other long term (current) drug therapy: Secondary | ICD-10-CM | POA: Insufficient documentation

## 2021-04-07 DIAGNOSIS — C7972 Secondary malignant neoplasm of left adrenal gland: Secondary | ICD-10-CM | POA: Insufficient documentation

## 2021-04-09 ENCOUNTER — Emergency Department (HOSPITAL_BASED_OUTPATIENT_CLINIC_OR_DEPARTMENT_OTHER)
Admit: 2021-04-09 | Discharge: 2021-04-09 | Disposition: A | Discharge status: Home / Self Care | Payer: PPO | Attending: Emergency Medicine | Admitting: Emergency Medicine

## 2021-04-09 ENCOUNTER — Emergency Department (HOSPITAL_COMMUNITY)
Admission: EM | Admit: 2021-04-09 | Discharge: 2021-04-09 | Disposition: A | Discharge status: Home / Self Care | Payer: PPO | Attending: Emergency Medicine | Admitting: Emergency Medicine

## 2021-04-09 ENCOUNTER — Emergency Department (HOSPITAL_COMMUNITY): Payer: PPO

## 2021-04-09 ENCOUNTER — Encounter (HOSPITAL_COMMUNITY): Payer: Self-pay

## 2021-04-09 ENCOUNTER — Other Ambulatory Visit: Payer: Self-pay

## 2021-04-09 DIAGNOSIS — J3489 Other specified disorders of nose and nasal sinuses: Secondary | ICD-10-CM | POA: Diagnosis not present

## 2021-04-09 DIAGNOSIS — Z515 Encounter for palliative care: Secondary | ICD-10-CM | POA: Diagnosis not present

## 2021-04-09 DIAGNOSIS — J449 Chronic obstructive pulmonary disease, unspecified: Secondary | ICD-10-CM | POA: Diagnosis not present

## 2021-04-09 DIAGNOSIS — N189 Chronic kidney disease, unspecified: Secondary | ICD-10-CM | POA: Diagnosis not present

## 2021-04-09 DIAGNOSIS — I129 Hypertensive chronic kidney disease with stage 1 through stage 4 chronic kidney disease, or unspecified chronic kidney disease: Secondary | ICD-10-CM | POA: Diagnosis not present

## 2021-04-09 DIAGNOSIS — Z7951 Long term (current) use of inhaled steroids: Secondary | ICD-10-CM | POA: Diagnosis not present

## 2021-04-09 DIAGNOSIS — J209 Acute bronchitis, unspecified: Secondary | ICD-10-CM | POA: Diagnosis not present

## 2021-04-09 DIAGNOSIS — Z20822 Contact with and (suspected) exposure to covid-19: Secondary | ICD-10-CM | POA: Insufficient documentation

## 2021-04-09 DIAGNOSIS — R0602 Shortness of breath: Secondary | ICD-10-CM | POA: Diagnosis not present

## 2021-04-09 DIAGNOSIS — Z79899 Other long term (current) drug therapy: Secondary | ICD-10-CM | POA: Diagnosis not present

## 2021-04-09 DIAGNOSIS — M7989 Other specified soft tissue disorders: Secondary | ICD-10-CM

## 2021-04-09 DIAGNOSIS — Z85118 Personal history of other malignant neoplasm of bronchus and lung: Secondary | ICD-10-CM | POA: Insufficient documentation

## 2021-04-09 DIAGNOSIS — I2510 Atherosclerotic heart disease of native coronary artery without angina pectoris: Secondary | ICD-10-CM | POA: Insufficient documentation

## 2021-04-09 DIAGNOSIS — R600 Localized edema: Secondary | ICD-10-CM | POA: Insufficient documentation

## 2021-04-09 DIAGNOSIS — R609 Edema, unspecified: Secondary | ICD-10-CM

## 2021-04-09 DIAGNOSIS — I517 Cardiomegaly: Secondary | ICD-10-CM | POA: Diagnosis not present

## 2021-04-09 LAB — CBC WITH DIFFERENTIAL/PLATELET
Abs Immature Granulocytes: 0.01 10*3/uL (ref 0.00–0.07)
Basophils Absolute: 0 10*3/uL (ref 0.0–0.1)
Basophils Relative: 1 %
Eosinophils Absolute: 0.4 10*3/uL (ref 0.0–0.5)
Eosinophils Relative: 10 %
HCT: 30.8 % — ABNORMAL LOW (ref 39.0–52.0)
Hemoglobin: 10 g/dL — ABNORMAL LOW (ref 13.0–17.0)
Immature Granulocytes: 0 %
Lymphocytes Relative: 21 %
Lymphs Abs: 0.9 10*3/uL (ref 0.7–4.0)
MCH: 33.4 pg (ref 26.0–34.0)
MCHC: 32.5 g/dL (ref 30.0–36.0)
MCV: 103 fL — ABNORMAL HIGH (ref 80.0–100.0)
Monocytes Absolute: 0.5 10*3/uL (ref 0.1–1.0)
Monocytes Relative: 12 %
Neutro Abs: 2.4 10*3/uL (ref 1.7–7.7)
Neutrophils Relative %: 56 %
Platelets: 159 10*3/uL (ref 150–400)
RBC: 2.99 MIL/uL — ABNORMAL LOW (ref 4.22–5.81)
RDW: 13.4 % (ref 11.5–15.5)
WBC: 4.3 10*3/uL (ref 4.0–10.5)
nRBC: 0 % (ref 0.0–0.2)

## 2021-04-09 LAB — HEPATIC FUNCTION PANEL
ALT: 12 U/L (ref 0–44)
AST: 17 U/L (ref 15–41)
Albumin: 4 g/dL (ref 3.5–5.0)
Alkaline Phosphatase: 52 U/L (ref 38–126)
Bilirubin, Direct: 0.1 mg/dL (ref 0.0–0.2)
Indirect Bilirubin: 0.3 mg/dL (ref 0.3–0.9)
Total Bilirubin: 0.4 mg/dL (ref 0.3–1.2)
Total Protein: 6.8 g/dL (ref 6.5–8.1)

## 2021-04-09 LAB — TROPONIN I (HIGH SENSITIVITY)
Troponin I (High Sensitivity): 3 ng/L (ref <18)
Troponin I (High Sensitivity): 3 ng/L (ref <18)

## 2021-04-09 LAB — URINALYSIS, ROUTINE W REFLEX MICROSCOPIC
Bacteria, UA: NONE SEEN
Bilirubin Urine: NEGATIVE
Glucose, UA: NEGATIVE mg/dL
Hgb urine dipstick: NEGATIVE
Ketones, ur: NEGATIVE mg/dL
Leukocytes,Ua: NEGATIVE
Nitrite: NEGATIVE
Protein, ur: NEGATIVE mg/dL
Specific Gravity, Urine: 1.01 (ref 1.005–1.030)
pH: 6 (ref 5.0–8.0)

## 2021-04-09 LAB — RESP PANEL BY RT-PCR (FLU A&B, COVID) ARPGX2
Influenza A by PCR: NEGATIVE
Influenza B by PCR: NEGATIVE
SARS Coronavirus 2 by RT PCR: NEGATIVE

## 2021-04-09 LAB — BASIC METABOLIC PANEL
Anion gap: 7 (ref 5–15)
BUN: 21 mg/dL (ref 8–23)
CO2: 28 mmol/L (ref 22–32)
Calcium: 9 mg/dL (ref 8.9–10.3)
Chloride: 103 mmol/L (ref 98–111)
Creatinine, Ser: 1.35 mg/dL — ABNORMAL HIGH (ref 0.61–1.24)
GFR, Estimated: 58 mL/min — ABNORMAL LOW (ref >60)
Glucose, Bld: 108 mg/dL — ABNORMAL HIGH (ref 70–99)
Potassium: 4.1 mmol/L (ref 3.5–5.1)
Sodium: 138 mmol/L (ref 135–145)

## 2021-04-09 LAB — BRAIN NATRIURETIC PEPTIDE: B Natriuretic Peptide: 12.5 pg/mL (ref 0.0–100.0)

## 2021-04-09 MED ORDER — MORPHINE SULFATE (PF) 4 MG/ML IV SOLN
4.0000 mg | Freq: Once | INTRAVENOUS | Status: AC
  Administered 2021-04-09: 4 mg via INTRAVENOUS
  Filled 2021-04-09: qty 1

## 2021-04-09 MED ORDER — HEPARIN SOD (PORK) LOCK FLUSH 100 UNIT/ML IV SOLN
500.0000 [IU] | Freq: Once | INTRAVENOUS | Status: AC
Start: 2021-04-09 — End: 2021-04-09
  Administered 2021-04-09: 500 [IU]
  Filled 2021-04-09: qty 5

## 2021-04-09 MED ORDER — ONDANSETRON HCL 4 MG/2ML IJ SOLN
4.0000 mg | Freq: Once | INTRAMUSCULAR | Status: AC
  Administered 2021-04-09: 4 mg via INTRAVENOUS
  Filled 2021-04-09: qty 2

## 2021-04-09 MED ORDER — SODIUM CHLORIDE 0.9 % IV BOLUS: 500.0000 mL | Freq: Once | INTRAVENOUS | Status: DC

## 2021-04-09 MED ORDER — DOXYCYCLINE HYCLATE 100 MG PO TABS
100.0000 mg | ORAL_TABLET | Freq: Once | ORAL | Status: AC
Start: 2021-04-09 — End: 2021-04-09
  Administered 2021-04-09: 100 mg via ORAL
  Filled 2021-04-09: qty 1

## 2021-04-09 MED ORDER — DIPHENHYDRAMINE HCL 50 MG/ML IJ SOLN
25.0000 mg | Freq: Once | INTRAMUSCULAR | Status: AC
  Administered 2021-04-09: 25 mg via INTRAVENOUS
  Filled 2021-04-09: qty 1

## 2021-04-09 MED ORDER — FUROSEMIDE 10 MG/ML IJ SOLN
40.0000 mg | Freq: Once | INTRAMUSCULAR | Status: AC
Start: 2021-04-09 — End: 2021-04-09
  Administered 2021-04-09: 40 mg via INTRAVENOUS
  Filled 2021-04-09: qty 4

## 2021-04-09 NOTE — Progress Notes (Signed)
Bilateral lower extremity venous duplex has been completed. Preliminary results can be found in CV Proc through chart review.  Results were given to Dr. Gilford Raid.  04/09/21 6:26 PM Carlos Levering RVT

## 2021-04-09 NOTE — ED Provider Notes (Addendum)
[place] DEPT Provider Note   CSN: [id number] Arrival date & time: 04/09/21  1542     History  Chief Complaint  Patient presents with   Leg Swelling   chemo patient    [name] [name] is a 66 y.o. male.  Pt is a 66 yo wm with a hx of CAD, HTN, obesity, dyslipidemia, depression/anxiety, COPD, GERD, and non-small cell lung cancer.  He is currently getting palliative chemo (last dose 1/25) with neulasta.  He does have periodic leg swelling and has prn lasix.  He noticed the leg swelling yesterday and took 40 mg lasix yesterday.  He said the swelling went down a little bit.  Pt has some sob and cough.  Pt denies fevers.        Home Medications Prior to Admission medications   Medication Sig Start Date End Date Taking? Authorizing Provider  acetaminophen (TYLENOL) 500 MG tablet Take 500-1,000 mg by mouth every 6 (six) hours as needed for moderate pain or headache.    [provider]  allopurinol (ZYLOPRIM) 100 MG tablet Take 200 mg daily by mouth.    [provider]  calcium carbonate (TUMS EX) 750 MG chewable tablet Chew 2-4 tablets by mouth daily as needed for heartburn.     [provider]  clonazePAM (KLONOPIN) 1 MG tablet Take 1 mg by mouth at bedtime as needed for anxiety. 09/29/20   [provider]  docusate sodium (COLACE) 100 MG capsule Take 1 capsule (100 mg total) by mouth 2 (two) times daily. 02/13/21   [name], MD  esomeprazole (NEXIUM) 40 MG capsule 1 cap(s) orally once a day 15 minutes before breakfast 03/30/09   [provider]  feeding supplement (ENSURE ENLIVE / ENSURE PLUS) LIQD Take 237 mLs by mouth 2 (two) times daily between meals. Patient not taking: Reported on 03/10/2021 02/13/21   [name], MD  fluticasone [facility]) 50 MCG/ACT nasal spray Place 1 spray into both nostrils daily as needed for allergies.     [provider]  folic acid (FOLVITE) 1 MG tablet Take 1  tablet (1 mg total) by mouth daily. During treatment with Alimta (chemo) 10/28/20   [name], MD  furosemide (LASIX) 20 MG tablet Take 1 tablet (20 mg total) by mouth daily as needed. Patient not taking: Reported on 03/10/2021 08/31/20   [name], MD  Hydrocortisone Acetate 2.5 % CREA Apply to the skin rash area twice daily. 03/31/21   [name], MD  hydrOXYzine (VISTARIL) 50 MG capsule Take 50 mg by mouth 3 (three) times daily as needed for itching. 05/23/19   [provider]  metoprolol tartrate (LOPRESSOR) 25 MG tablet TAKE 1 TABLET(25 MG) BY MOUTH TWICE DAILY Patient not taking: Reported on 02/18/2021 11/20/19   [name], MD  morphine (MS CONTIN) 60 MG 12 hr tablet 60 mg every 12 (twelve) hours. 12/21/20   [provider]  morphine (MSIR) 30 MG tablet Take 30 mg by mouth every 6 (six) hours as needed. Patient not taking: Reported on 03/10/2021 11/13/20   [provider]  nitroGLYCERIN (NITROSTAT) 0.4 MG SL tablet Place 1 tablet (0.4 mg total) under the tongue every 5 (five) minutes as needed for chest pain. X 3 doses Patient not taking: Reported on 02/18/2021 12/15/17   [name], MD  ondansetron (ZOFRAN) 8 MG tablet Take 1 tablet (8 mg total) by mouth every 8 (eight) hours as needed for nausea or vomiting. Starting 3  days after chemotherapy Patient not taking: Reported on 03/10/2021 12/30/20   Heilingoetter, Cassandra L, PA-C  pantoprazole (PROTONIX) 40 MG tablet Take 1 tablet (40 mg total) by mouth daily. 02/14/21   [name], MD  prochlorperazine (COMPAZINE) 10 MG tablet Take by mouth daily. 03/23/21   [provider]  rosuvastatin (CRESTOR) 20 MG tablet TAKE 1 TABLET(20 MG) BY MOUTH DAILY Patient taking differently: Take 20 mg by mouth daily. 11/11/20   [name], MD  ticagrelor (BRILINTA) 60 MG TABS tablet Take 1 tablet (60 mg total) by mouth 2 (two) times daily. 02/09/21   [name], MD      Allergies     Fish allergy, Iohexol, and Ivp dye [iodinated contrast media]    Review of Systems   Review of Systems  Respiratory:  Positive for shortness of breath.   Cardiovascular:  Positive for leg swelling.  All other systems reviewed and are negative.  Physical Exam Updated Vital Signs BP 112/65    Pulse 87    Temp 98.2 F (36.8 C) (Oral)    Resp 16    Ht 5\' 8"  (1.727 m)    Wt 100 kg    SpO2 100%    BMI 33.52 kg/m  Physical Exam Vitals and nursing note reviewed.  Constitutional:      Appearance: Normal appearance. He is obese.  HENT:     Head: Normocephalic and atraumatic.     Right Ear: External ear normal.     Left Ear: External ear normal.     Nose: Nose normal.     Mouth/Throat:     Mouth: Mucous membranes are moist.     Pharynx: Oropharynx is clear.  Eyes:     Extraocular Movements: Extraocular movements intact.     Conjunctiva/sclera: Conjunctivae normal.     Pupils: Pupils are equal, round, and reactive to light.  Cardiovascular:     Rate and Rhythm: Normal rate and regular rhythm.     Pulses: Normal pulses.     Heart sounds: Normal heart sounds.  Pulmonary:     Breath sounds: Wheezing and rhonchi present.  Abdominal:     General: Abdomen is flat. Bowel sounds are normal.     Palpations: Abdomen is soft.  Musculoskeletal:     Cervical back: Normal range of motion and neck supple.     Right lower leg: Edema present.  Skin:    General: Skin is warm.     Capillary Refill: Capillary refill takes less than 2 seconds.  Neurological:     General: No focal deficit present.     Mental Status: He is alert and oriented to person, place, and time.  Psychiatric:        Mood and Affect: Mood normal.        Behavior: Behavior normal.    ED Results / Procedures / Treatments   Labs (all labs ordered are listed, but only abnormal results are displayed) Labs Reviewed  CBC WITH DIFFERENTIAL/PLATELET - Abnormal; Notable for the following components:      Result Value   RBC 2.99  (*)    Hemoglobin 10.0 (*)    HCT 30.8 (*)    MCV 103.0 (*)    All other components within normal limits  BASIC METABOLIC PANEL - Abnormal; Notable for the following components:   Glucose, Bld 108 (*)    Creatinine, Ser 1.35 (*)    GFR, Estimated 58 (*)    All other components within normal limits  URINALYSIS, ROUTINE W REFLEX MICROSCOPIC - Abnormal; Notable for the following components:   Color, Urine YELLOW (*)    APPearance CLEAR (*)    All other components within normal limits  RESP PANEL BY RT-PCR (FLU A&B, COVID) ARPGX2  BRAIN NATRIURETIC PEPTIDE  HEPATIC FUNCTION PANEL  TROPONIN I (HIGH SENSITIVITY)  TROPONIN I (HIGH SENSITIVITY)    EKG EKG Interpretation  Date/Time:  Friday April 09 2021 17:51:05 EST Ventricular Rate:  66 PR Interval:    QRS Duration: 95 QT Interval:  387 QTC Calculation: 412 R Axis:   65 Text Interpretation: Normal sinus rhythm Low voltage, precordial leads Borderline T wave abnormalities Artifact in lead(s) I III aVL No significant change since last tracing Confirmed by Isla Pence [phone]) on 04/09/2021 6:08:41 PM  Radiology DG Chest 2 View  Result Date: 04/09/2021 CLINICAL DATA:  Shortness of breath EXAM: CHEST - 2 VIEW COMPARISON:  08/19/2020 FINDINGS: Right Port-A-Cath in place with the tip in the SVC. Cardiomegaly. No confluent airspace opacities, effusions or edema. No acute bony abnormality. IMPRESSION: Mild cardiomegaly. No active disease. Electronically Signed   By: Rolm Baptise M.D.   On: 04/09/2021 19:04   VAS [place] LOWER EXTREMITY VENOUS (DVT) (7a-7p)  Result Date: 04/09/2021  Lower Venous DVT Study Patient Name:  [name] [name]  Date of Exam:   04/09/2021 Medical Rec #: [id number]       Accession #:    [id number] Date of Birth: 03-Apr-1955       Patient Gender: M Patient Age:   74 years Exam Location:  [facility] Procedure:      VAS [place] LOWER EXTREMITY VENOUS (DVT) Referring Phys: Shonteria Abeln  --------------------------------------------------------------------------------  Indications: Swelling.  Risk Factors: None identified. Limitations: Poor ultrasound/tissue interface and body habitus. Comparison Study: No prior studies. Performing Technologist: Oliver Hum RVT  Examination Guidelines: A complete evaluation includes B-mode imaging, spectral Doppler, color Doppler, and power Doppler as needed of all accessible portions of each vessel. Bilateral testing is considered an integral part of a complete examination. Limited examinations for reoccurring indications may be performed as noted. The reflux portion of the exam is performed with the patient in reverse Trendelenburg.  +---------+---------------+---------+-----------+----------+-------------------+  RIGHT     Compressibility Phasicity Spontaneity Properties Thrombus Aging       +---------+---------------+---------+-----------+----------+-------------------+  CFV       Full            Yes       Yes                                         +---------+---------------+---------+-----------+----------+-------------------+  SFJ       Full                                                                  +---------+---------------+---------+-----------+----------+-------------------+  FV Prox   Full                                                                  +---------+---------------+---------+-----------+----------+-------------------+  FV Mid    Full                                                                  +---------+---------------+---------+-----------+----------+-------------------+  FV Distal                 Yes       Yes                                         +---------+---------------+---------+-----------+----------+-------------------+  PFV       Full                                                                  +---------+---------------+---------+-----------+----------+-------------------+  POP       Full            Yes        Yes                                         +---------+---------------+---------+-----------+----------+-------------------+  PTV       Full                                                                  +---------+---------------+---------+-----------+----------+-------------------+  PERO                                                       Not well visualized  +---------+---------------+---------+-----------+----------+-------------------+   +---------+---------------+---------+-----------+----------+-------------------+  LEFT      Compressibility Phasicity Spontaneity Properties Thrombus Aging       +---------+---------------+---------+-----------+----------+-------------------+  CFV       Full            Yes       Yes                                         +---------+---------------+---------+-----------+----------+-------------------+  SFJ       Full                                                                  +---------+---------------+---------+-----------+----------+-------------------+  FV Prox   Full                                                                  +---------+---------------+---------+-----------+----------+-------------------+  FV Mid    Full                                                                  +---------+---------------+---------+-----------+----------+-------------------+  FV Distal                 Yes       Yes                                         +---------+---------------+---------+-----------+----------+-------------------+  PFV       Full                                                                  +---------+---------------+---------+-----------+----------+-------------------+  POP       Full            Yes       Yes                                         +---------+---------------+---------+-----------+----------+-------------------+  PTV       Full                                                                   +---------+---------------+---------+-----------+----------+-------------------+  PERO                                                       Not well visualized  +---------+---------------+---------+-----------+----------+-------------------+    Summary: RIGHT: - There is no evidence of deep vein thrombosis in the lower extremity. However, portions of this examination were limited- see technologist comments above.  - No cystic structure found in the popliteal fossa.  LEFT: - There is no evidence of deep vein thrombosis in the lower extremity. However, portions of this examination were limited- see technologist comments above.  - No cystic structure found in the popliteal fossa.  *See table(s) above for measurements and observations.    Preliminary     Procedures Procedures    Medications Ordered in ED Medications  heparin lock flush 100 unit/mL (has no administration in time range)  furosemide (LASIX) injection 40 mg (40 mg Intravenous Given 04/09/21 1802)  morphine (PF) 4 MG/ML injection 4 mg (4 mg Intravenous Given 04/09/21 1924)  ondansetron (ZOFRAN) injection 4 mg (4 mg Intravenous Given 04/09/21 1922)  diphenhydrAMINE (BENADRYL) injection 25 mg (25 mg Intravenous Given 04/09/21 1944)  doxycycline (VIBRA-TABS) tablet 100 mg (100 mg Oral Given 04/09/21 1944)    ED  Course/ Medical Decision Making/ A&P                           Medical Decision Making Amount and/or Complexity of Data Reviewed Labs: ordered. ECG/medicine tests: ordered.  Risk Prescription drug management.   [place] to r/o DVT ordered due to leg swelling and cancer hx.  This is neg.  Labs reviewed.  Pt has chronic anemia and chronic kidney disease.    CXR is clear.  Pt given 40 mg of lasix IV.  He responded well to the lasix.  He feels better.  Pt is to continue taking lasix 40 mg daily  prn leg swelling.  Pt's cough is likely bronchitis.  He has a neg CXR.  Due to his immunocompromised status, I will send him home with doxy.     Pt and his sister under stand plan.    Pt knows to return if worse.  F/u with pcp.      Final Clinical Impression(s) / ED Diagnoses Final diagnoses:  Peripheral edema  Acute bronchitis, unspecified organism    Rx / DC Orders ED Discharge Orders     None         Isla Pence, MD 04/09/21 Raye Sorrow    Isla Pence, MD 04/09/21 [phone]

## 2021-04-09 NOTE — Discharge Instructions (Addendum)
Take lasix 40 mg daily as needed for the leg swelling.

## 2021-04-09 NOTE — ED Triage Notes (Signed)
Patient reports that he began having swelling and pain in his legs and feet. Patient denies any SOB at this time.

## 2021-04-09 NOTE — ED Provider Triage Note (Signed)
Emergency Medicine Provider Triage Evaluation Note  [name] [name] , a 66 y.o. male  was evaluated in triage.  Pt complains of bilateral lower extremity swelling since yesterday.  Patient has diagnosis of congestive heart failure, takes 40 mg Lasix once a day.  Patient states that he took the Lasix dose yesterday and today, with slight relief of swelling.  Patient states bilateral lower feet are also tender to touch.  Patient currently undergoing treatment for non-small cell lung cancer.  Patient denies any calf tenderness.  Review of Systems  Positive: Bilateral ankle swelling, shortness of breath Negative: Chest pain, nausea, vomiting  Physical Exam  BP 132/71 (BP Location: Left Arm)    Pulse 95    Temp 98.2 F (36.8 C) (Oral)    Resp 18    Ht 5\' 8"  (1.727 m)    Wt 100 kg    SpO2 98%    BMI 33.52 kg/m  Gen:   Awake, no distress   Resp:  Normal effort  MSK:   Moves extremities without difficulty  Other:  1+ pitting edema bilaterally to ankles.  Medical Decision Making  Medically screening exam initiated at 4:05 PM.  Appropriate orders placed.  [name] was informed that the remainder of the evaluation will be completed by another provider, this initial triage assessment does not replace that evaluation, and the importance of remaining in the ED until their evaluation is complete.     [name], PA-C 04/09/21 1606

## 2021-04-12 DIAGNOSIS — F0390 Unspecified dementia without behavioral disturbance: Secondary | ICD-10-CM | POA: Diagnosis not present

## 2021-04-12 DIAGNOSIS — I10 Essential (primary) hypertension: Secondary | ICD-10-CM | POA: Diagnosis not present

## 2021-04-12 DIAGNOSIS — R600 Localized edema: Secondary | ICD-10-CM | POA: Diagnosis not present

## 2021-04-13 ENCOUNTER — Other Ambulatory Visit: Payer: Self-pay | Admitting: *Deleted

## 2021-04-13 NOTE — Progress Notes (Signed)
Order Review for Labs

## 2021-04-14 ENCOUNTER — Inpatient Hospital Stay: Payer: PPO

## 2021-04-19 ENCOUNTER — Ambulatory Visit (HOSPITAL_COMMUNITY)
Admission: RE | Admit: 2021-04-19 | Discharge: 2021-04-19 | Disposition: A | Discharge status: Home / Self Care | Payer: PPO | Source: Ambulatory Visit | Attending: Internal Medicine | Admitting: Internal Medicine

## 2021-04-19 ENCOUNTER — Other Ambulatory Visit: Payer: Self-pay

## 2021-04-19 DIAGNOSIS — R918 Other nonspecific abnormal finding of lung field: Secondary | ICD-10-CM | POA: Diagnosis not present

## 2021-04-19 DIAGNOSIS — J90 Pleural effusion, not elsewhere classified: Secondary | ICD-10-CM | POA: Diagnosis not present

## 2021-04-19 DIAGNOSIS — J432 Centrilobular emphysema: Secondary | ICD-10-CM | POA: Diagnosis not present

## 2021-04-19 DIAGNOSIS — C3490 Malignant neoplasm of unspecified part of unspecified bronchus or lung: Secondary | ICD-10-CM | POA: Insufficient documentation

## 2021-04-19 IMAGING — CT CT ABD-PELV W/O CM
2 of 4 series · 13 of 36 positions shown, 16 images · non-contrast
Comparison: [DATE]

CLINICAL DATA: Non-small cell lung cancer restaging, ongoing
chemotherapy and immunotherapy



[Series 2: cap w/o · axial · non-contrast · 0.93mm/px · z∈[-554,-14]mm · 10 of 133 slices shown, 13 images]
[im 13/133  mediastinal]
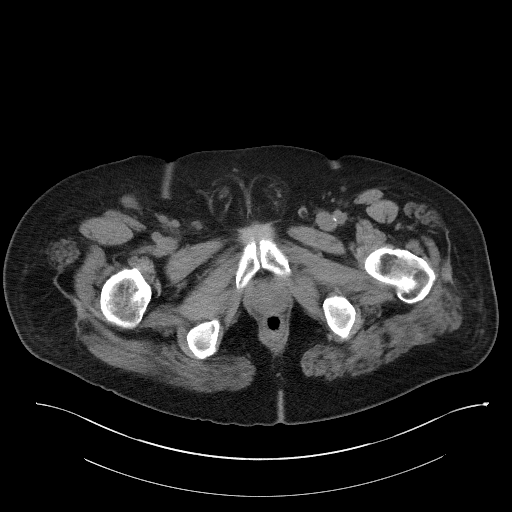
[im 13/133  lung]
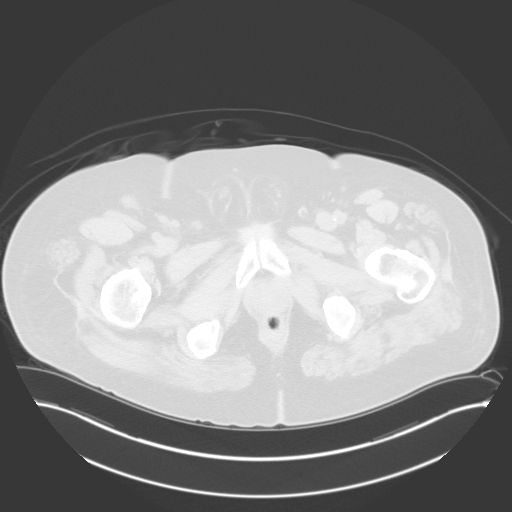
[im 25/133  lung]
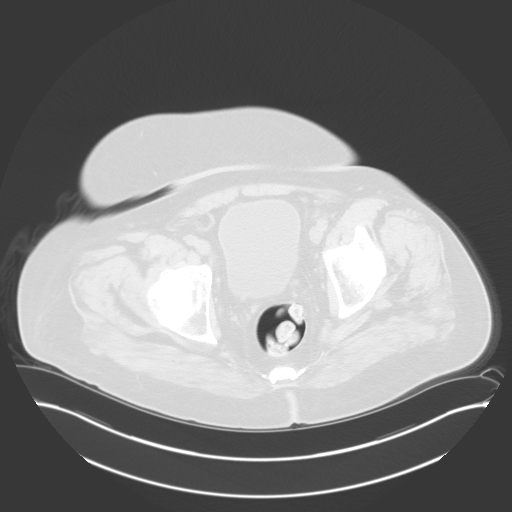
[im 37/133  lung]
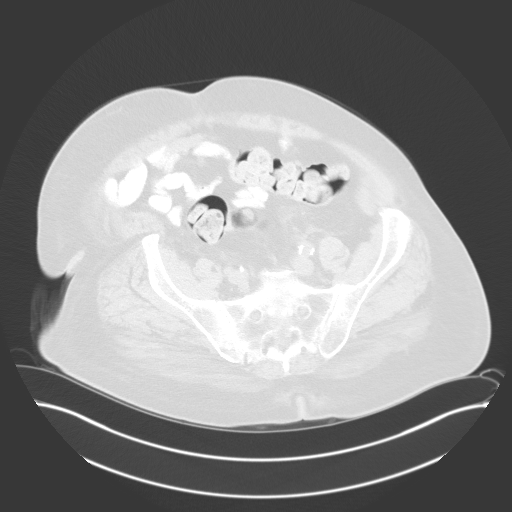
[im 49/133  lung]
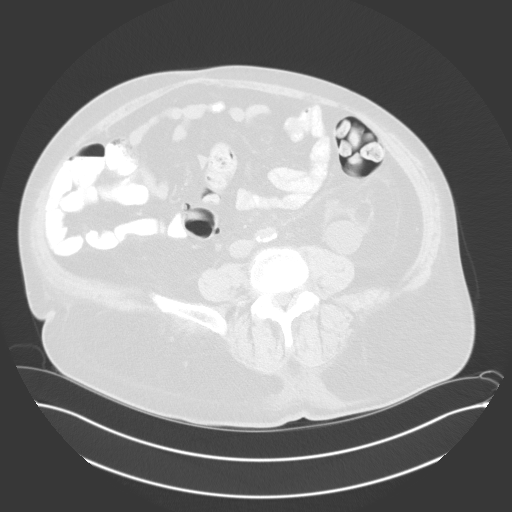
[im 61/133  mediastinal]
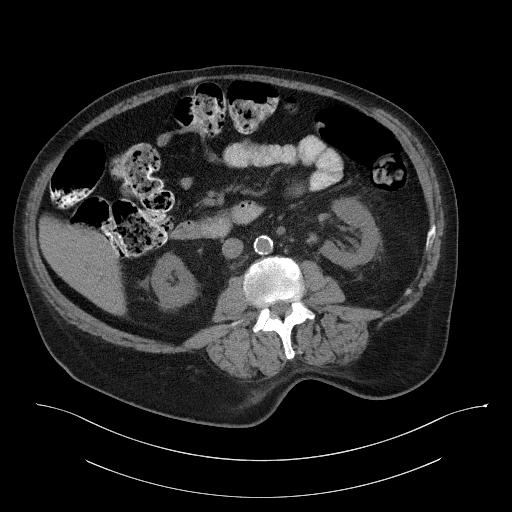
[im 61/133  lung]
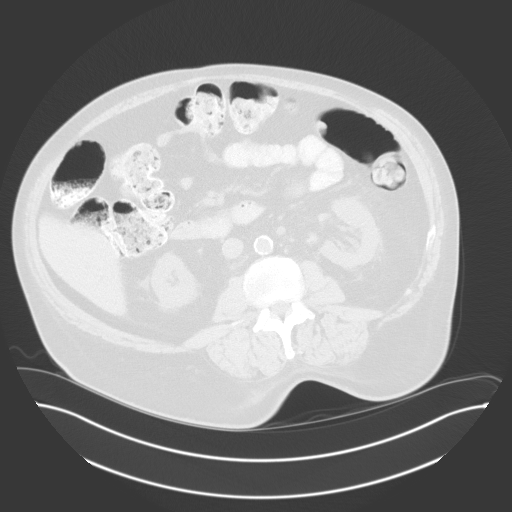
[im 73/133  lung]
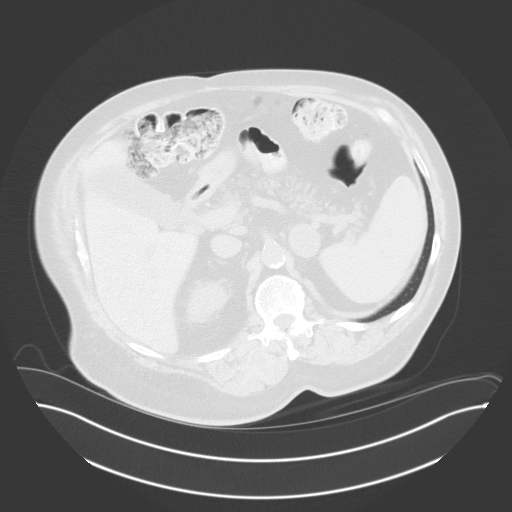
[im 85/133  lung]
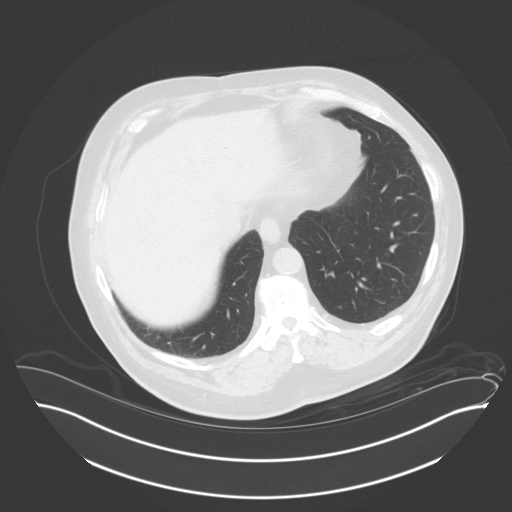
[im 97/133  lung]
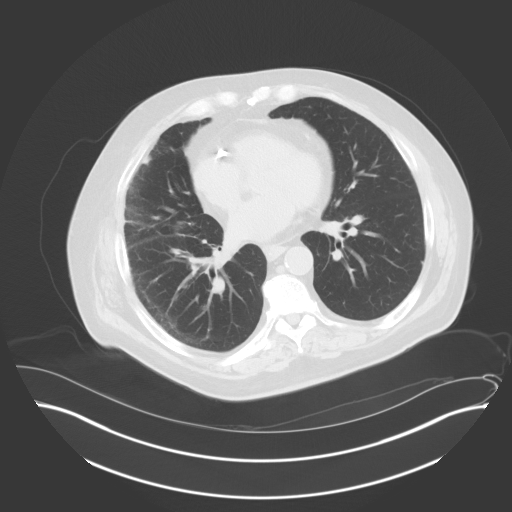
[im 109/133  mediastinal]
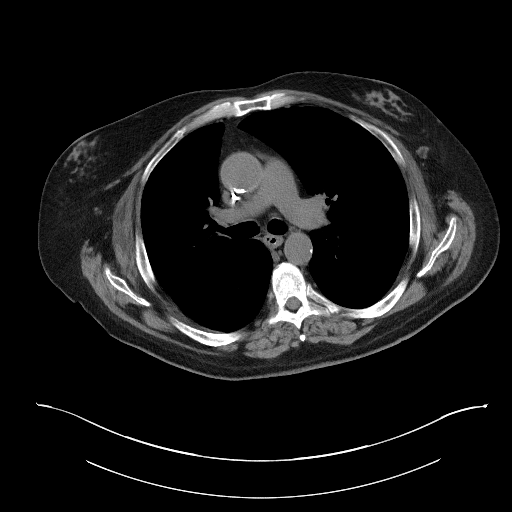
[im 109/133  lung]
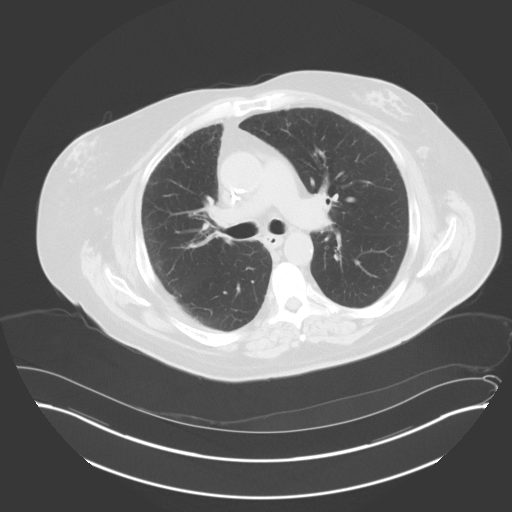
[im 121/133  lung]
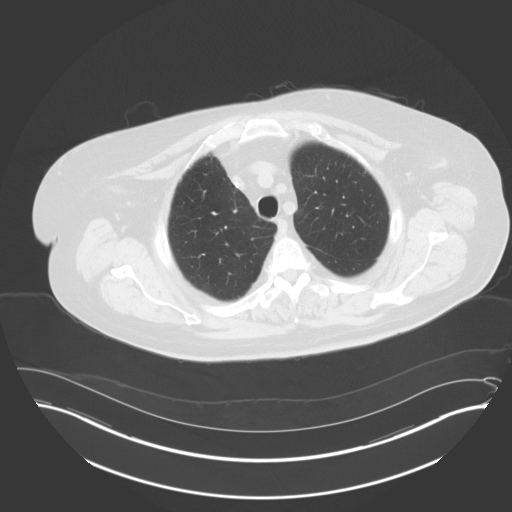

[Series 5: coronals · coronal · 0.86mm/px · 3 of 171 slices shown]
[im 35/171  lung]
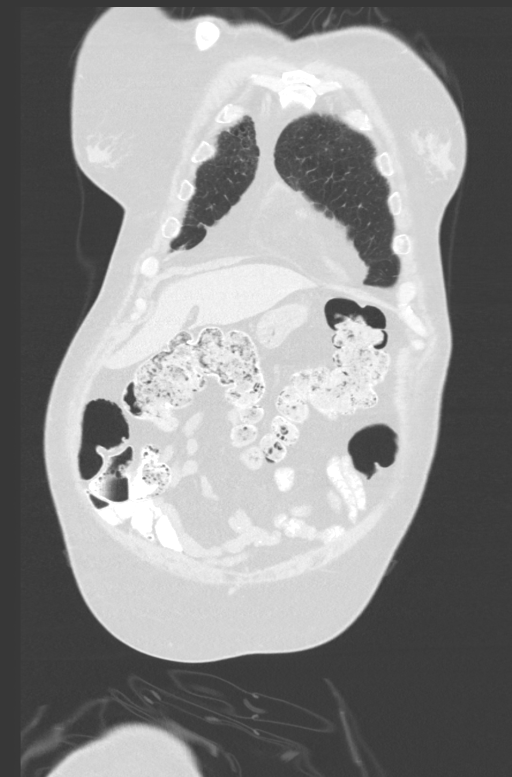
[im 69/171  lung]
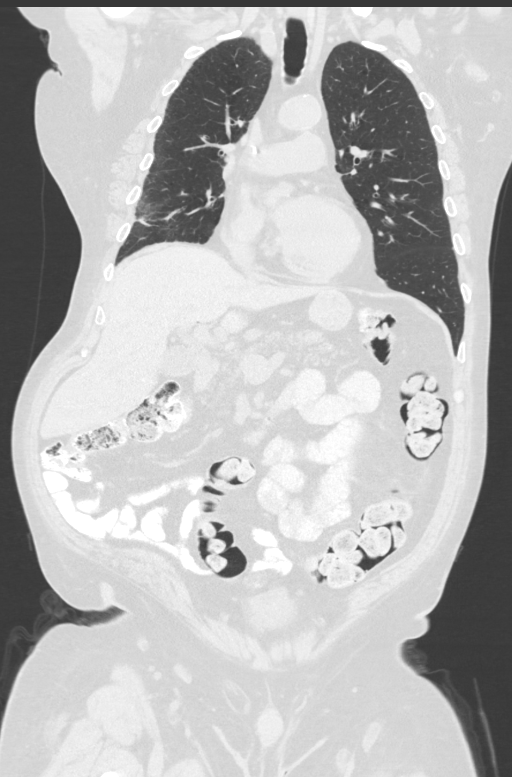
[im 103/171  lung]
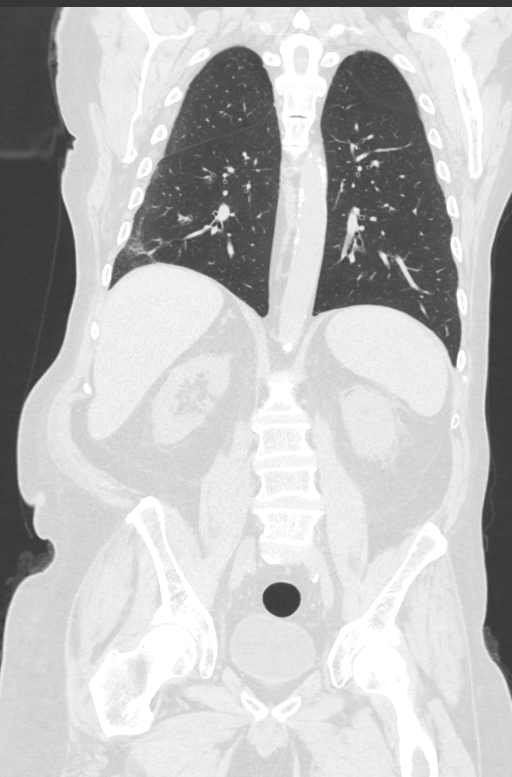

[13 of 36 positions shown; findings below may reference images not displayed]

FINDINGS: CT CHEST FINDINGS

Cardiovascular: Aortic atherosclerosis. Right chest port catheter.
Normal heart size. Three-vessel coronary artery calcifications and
stents. No pericardial effusion.

Mediastinum/Nodes: Unchanged right hilar lymph node measuring 1.6 x
1.5 cm (series 2, image 29). No other enlarged mediastinal, hilar,
or axillary lymph nodes. Thyroid gland, trachea, and esophagus
demonstrate no significant findings.

Lungs/Pleura: Mild centrilobular and paraseptal emphysema. Interval
resolution of a previously seen trace right pleural effusion.
Continued decrease in size of an irregular, previously cavitary mass
of the right lower lobe and adjacent lateral segment right middle
lobe, measuring 2.9 x 1.1 cm, previously 3.9 x 2.2 cm when measured
similarly (series 4, image 100). Multiple small bilateral pulmonary
nodules are unchanged, for example a 0.5 cm nodule of the peripheral
right upper lobe (series 4, image 48), a 0.6 cm nodule of the
superior segment right lower lobe (series 4, image 73), and a 0.4 cm
nodule of the dependent left lower lobe (series 4, image 115).

Musculoskeletal: No chest wall mass or suspicious osseous lesions
identified. Bilateral gynecomastia.

CT ABDOMEN PELVIS FINDINGS

Hepatobiliary: No solid liver abnormality is seen. Cholelithiasis.
No gallbladder wall thickening or biliary dilatation. Post
sphincterotomy pneumobilia.

Pancreas: Unremarkable. No pancreatic ductal dilatation or
surrounding inflammatory changes.

Spleen: Normal in size without significant abnormality.

Adrenals/Urinary Tract: Interval decrease in size of a rounded,
internal fluid attenuation left adrenal metastasis, measuring 3.2 x
3.1 cm, previously 4.1 x 3.8 cm (series 2, image 63). Kidneys are
normal, without renal calculi, solid lesion, or hydronephrosis.
Bladder is unremarkable.

Stomach/Bowel: Stomach is within normal limits. Appendix appears
normal. No evidence of bowel wall thickening, distention, or
inflammatory changes.

Vascular/Lymphatic: Aortic atherosclerosis. No enlarged abdominal or
pelvic lymph nodes.

Reproductive: No mass or other abnormality.

Other: No abdominal wall hernia or abnormality. No ascites.

Musculoskeletal: No acute osseous findings.
IMPRESSION: 1. Continued decrease in size of an irregular, previously cavitary
mass of the right lower lobe and adjacent lateral segment right
middle lobe, consistent with continued treatment response.
2. Unchanged enlarged right hilar lymph node.
3. Interval decrease in size of a rounded, internal fluid
attenuation left adrenal metastasis, consistent with continued
treatment response and internal necrosis.
4. Multiple unchanged small pulmonary nodules, nonspecific.
Attention on follow-up.
5. No noncontrast CT evidence of new metastatic disease in the
chest, abdomen, or pelvis.
6. Emphysema.
7. Cholelithiasis. Post sphincterotomy pneumobilia.
8. Coronary artery disease.

Aortic Atherosclerosis ([GE]-[GE]) and Emphysema ([GE]-[GE]).

## 2021-04-19 IMAGING — CT CT CHEST W/O CM
2 of 4 series · 13 of 36 positions shown, 16 images · non-contrast
Comparison: [DATE]

CLINICAL DATA: Non-small cell lung cancer restaging, ongoing
chemotherapy and immunotherapy



[Series 2: cap w/o · axial · non-contrast · 0.93mm/px · z∈[-554,-14]mm · 10 of 133 slices shown, 13 images]
[im 13/133  mediastinal]
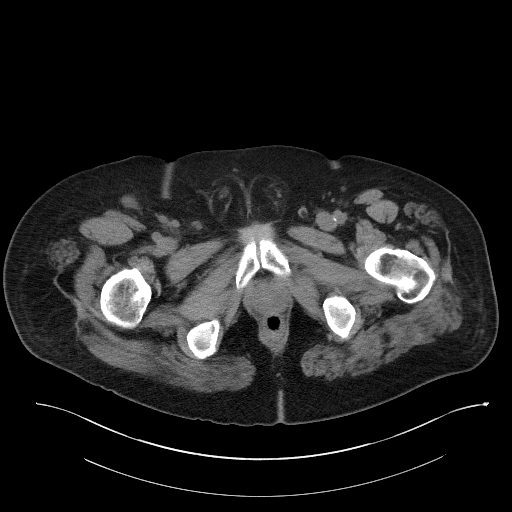
[im 13/133  lung]
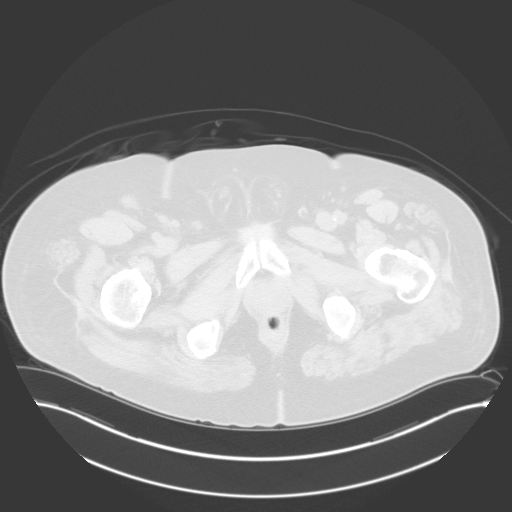
[im 25/133  lung]
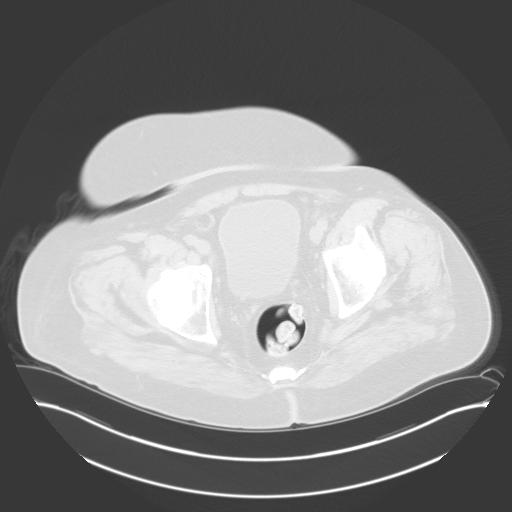
[im 37/133  lung]
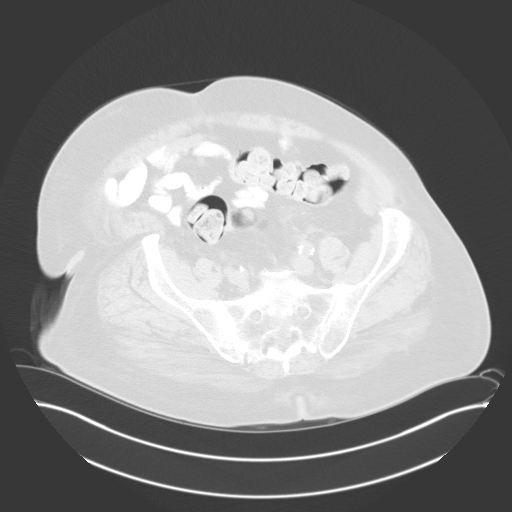
[im 49/133  lung]
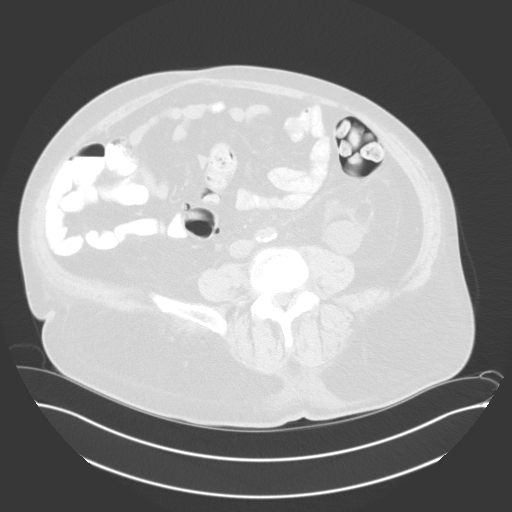
[im 61/133  mediastinal]
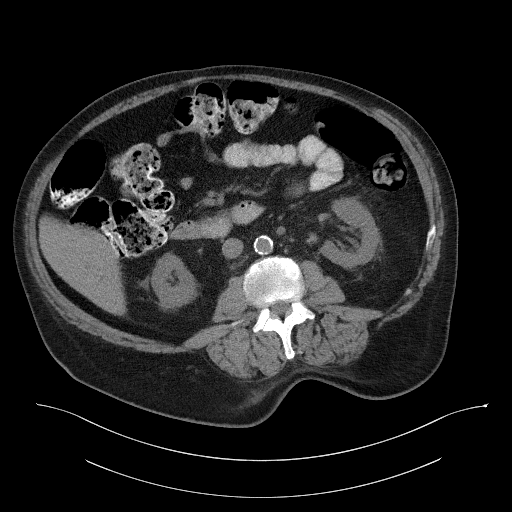
[im 61/133  lung]
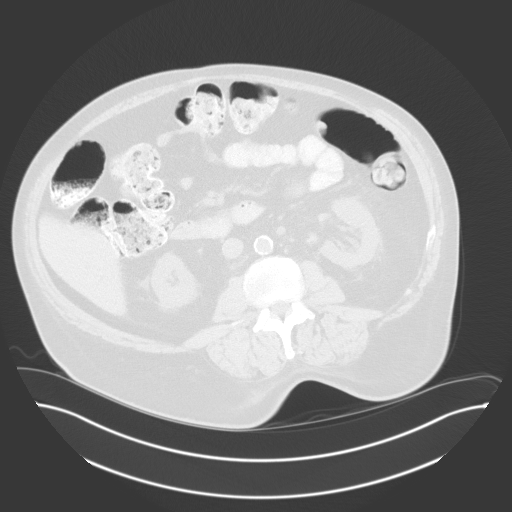
[im 73/133  lung]
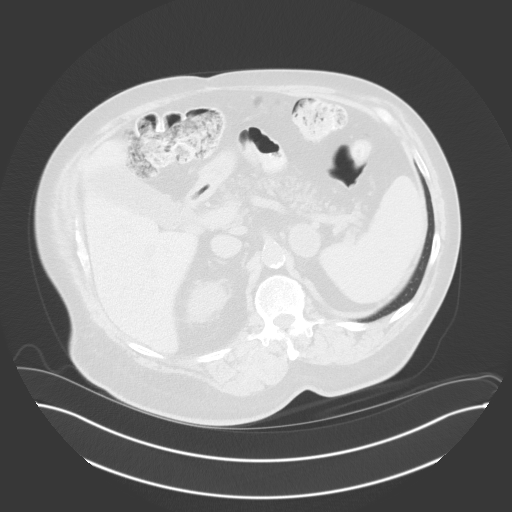
[im 85/133  lung]
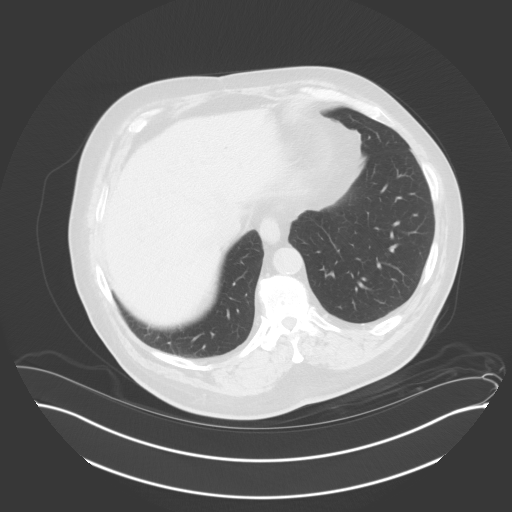
[im 97/133  lung]
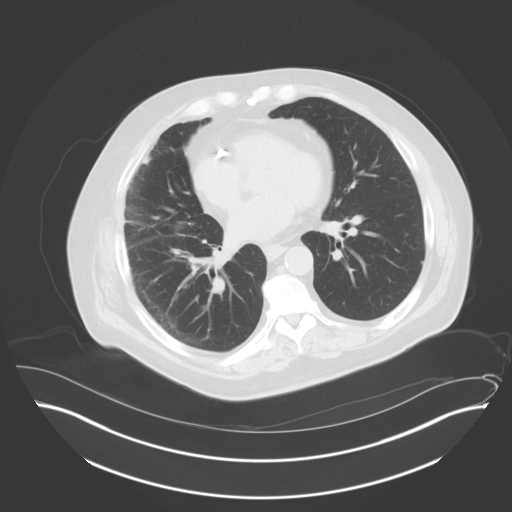
[im 109/133  mediastinal]
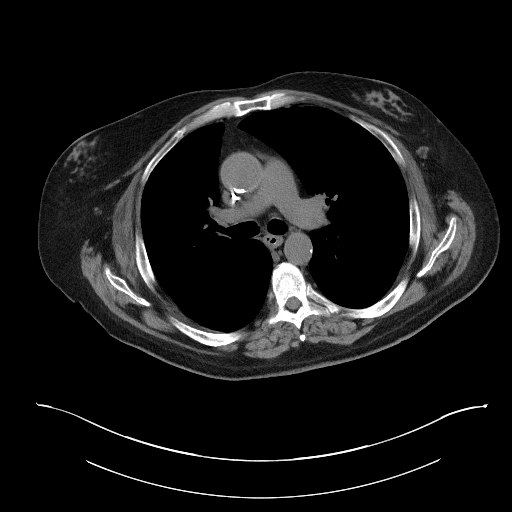
[im 109/133  lung]
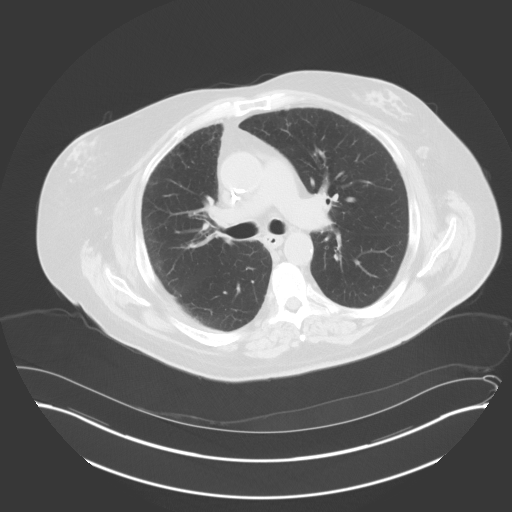
[im 121/133  lung]
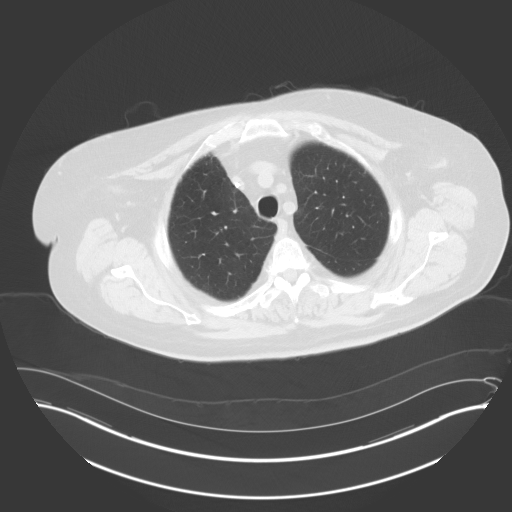

[Series 5: coronals · coronal · 0.86mm/px · 3 of 171 slices shown]
[im 35/171  lung]
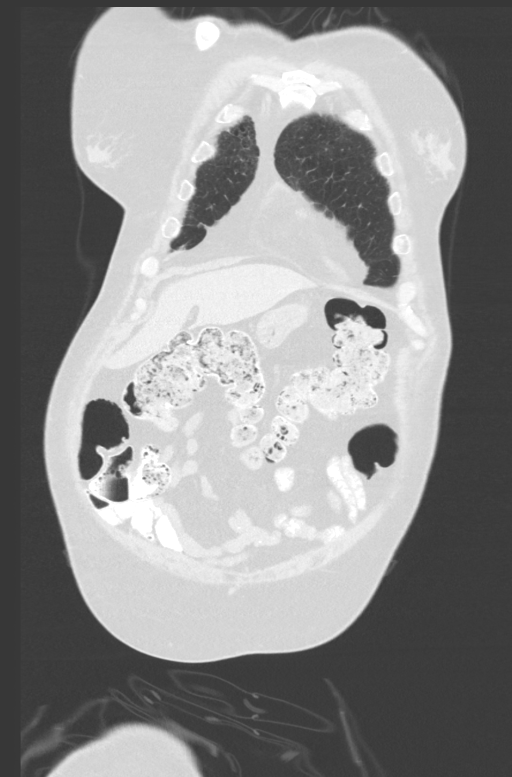
[im 69/171  lung]
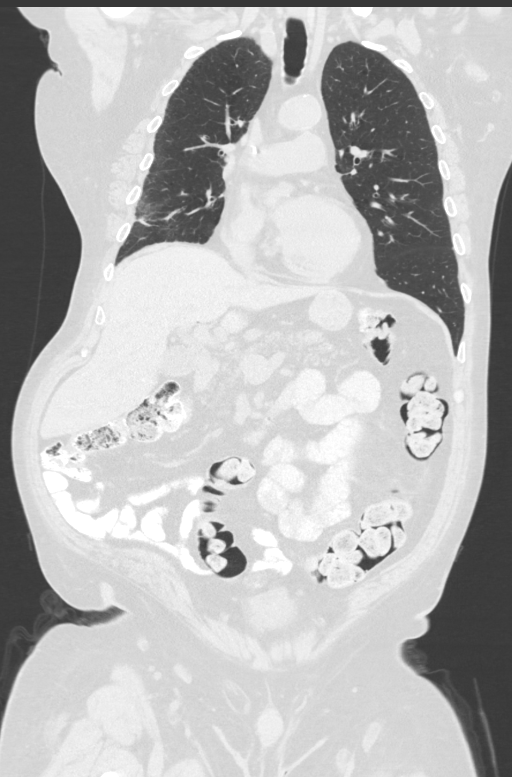
[im 103/171  lung]
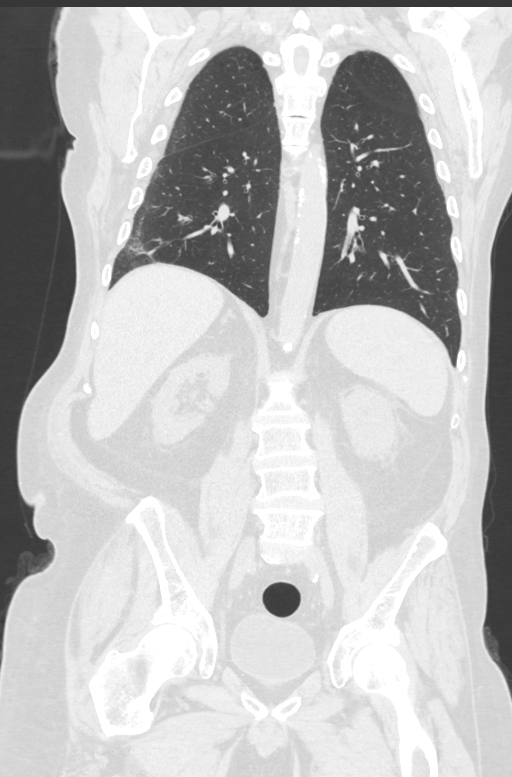

[13 of 36 positions shown; findings below may reference images not displayed]

FINDINGS: CT CHEST FINDINGS

Cardiovascular: Aortic atherosclerosis. Right chest port catheter.
Normal heart size. Three-vessel coronary artery calcifications and
stents. No pericardial effusion.

Mediastinum/Nodes: Unchanged right hilar lymph node measuring 1.6 x
1.5 cm (series 2, image 29). No other enlarged mediastinal, hilar,
or axillary lymph nodes. Thyroid gland, trachea, and esophagus
demonstrate no significant findings.

Lungs/Pleura: Mild centrilobular and paraseptal emphysema. Interval
resolution of a previously seen trace right pleural effusion.
Continued decrease in size of an irregular, previously cavitary mass
of the right lower lobe and adjacent lateral segment right middle
lobe, measuring 2.9 x 1.1 cm, previously 3.9 x 2.2 cm when measured
similarly (series 4, image 100). Multiple small bilateral pulmonary
nodules are unchanged, for example a 0.5 cm nodule of the peripheral
right upper lobe (series 4, image 48), a 0.6 cm nodule of the
superior segment right lower lobe (series 4, image 73), and a 0.4 cm
nodule of the dependent left lower lobe (series 4, image 115).

Musculoskeletal: No chest wall mass or suspicious osseous lesions
identified. Bilateral gynecomastia.

CT ABDOMEN PELVIS FINDINGS

Hepatobiliary: No solid liver abnormality is seen. Cholelithiasis.
No gallbladder wall thickening or biliary dilatation. Post
sphincterotomy pneumobilia.

Pancreas: Unremarkable. No pancreatic ductal dilatation or
surrounding inflammatory changes.

Spleen: Normal in size without significant abnormality.

Adrenals/Urinary Tract: Interval decrease in size of a rounded,
internal fluid attenuation left adrenal metastasis, measuring 3.2 x
3.1 cm, previously 4.1 x 3.8 cm (series 2, image 63). Kidneys are
normal, without renal calculi, solid lesion, or hydronephrosis.
Bladder is unremarkable.

Stomach/Bowel: Stomach is within normal limits. Appendix appears
normal. No evidence of bowel wall thickening, distention, or
inflammatory changes.

Vascular/Lymphatic: Aortic atherosclerosis. No enlarged abdominal or
pelvic lymph nodes.

Reproductive: No mass or other abnormality.

Other: No abdominal wall hernia or abnormality. No ascites.

Musculoskeletal: No acute osseous findings.
IMPRESSION: 1. Continued decrease in size of an irregular, previously cavitary
mass of the right lower lobe and adjacent lateral segment right
middle lobe, consistent with continued treatment response.
2. Unchanged enlarged right hilar lymph node.
3. Interval decrease in size of a rounded, internal fluid
attenuation left adrenal metastasis, consistent with continued
treatment response and internal necrosis.
4. Multiple unchanged small pulmonary nodules, nonspecific.
Attention on follow-up.
5. No noncontrast CT evidence of new metastatic disease in the
chest, abdomen, or pelvis.
6. Emphysema.
7. Cholelithiasis. Post sphincterotomy pneumobilia.
8. Coronary artery disease.

Aortic Atherosclerosis ([GE]-[GE]) and Emphysema ([GE]-[GE]).

## 2021-04-20 NOTE — Progress Notes (Signed)
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                                                                  +---------+---------------+---------+-----------+----------+-------------------+  POP       Full            Yes       Yes                                         +---------+---------------+---------+-----------+----------+-------------------+  PTV       Full                                                                  +---------+---------------+---------+-----------+----------+-------------------+  PERO                                                       Not well visualized  +---------+---------------+---------+-----------+----------+-------------------+   +---------+---------------+---------+-----------+----------+-------------------+  LEFT      Compressibility Phasicity Spontaneity Properties Thrombus Aging       +---------+---------------+---------+-----------+----------+-------------------+  CFV       Full            Yes       Yes                                         +---------+---------------+---------+-----------+----------+-------------------+  SFJ       Full                                                                   +---------+---------------+---------+-----------+----------+-------------------+  FV Prox   Full                                                                  +---------+---------------+---------+-----------+----------+-------------------+  FV Mid    Full                                                                  +---------+---------------+---------+-----------+----------+-------------------+  FV Distal                 Yes       Yes                                         +---------+---------------+---------+-----------+----------+-------------------+  PFV       Full                                                                  +---------+---------------+---------+-----------+----------+-------------------+  POP       Full            Yes       Yes                                         +---------+---------------+---------+-----------+----------+-------------------+  PTV       Full                                                                  +---------+---------------+---------+-----------+----------+-------------------+  PERO                                                       Not well visualized  +---------+---------------+---------+-----------+----------+-------------------+     Summary: RIGHT: - There is no evidence of deep vein thrombosis in the lower extremity. However, portions of this examination were limited- see technologist comments above.  - No cystic structure found in the popliteal fossa.  LEFT: - There is no evidence of deep vein thrombosis in the lower extremity. However, portions of this examination were limited- see technologist comments above.  - No cystic structure found in the popliteal fossa.  *See table(s) above for measurements and observations. Electronically signed by Harold Barban MD on 04/10/2021 at 5:28:49 PM.    Final      ASSESSMENT/PLAN:  This is a very pleasant 66 years old Caucasian male diagnosed with a stage IV (T3, N2, M1 C) non-small cell lung cancer, likely  adenocarcinoma based on the presence of KRAS G12C mutation presented with large right lower lobe lung mass, enlarged with adjacent subpleural nodules in addition to right hilar and subcarinal lymphadenopathy and left adrenal gland metastasis diagnosed in July 2022.  Molecular studies showed positive KRAS G12C mutation, his histology is likely adenocarcinoma  The patient started initially on systemic chemotherapy with carboplatin for AUC of 5, paclitaxel 175 Mg/M2 and Keytruda 200 Mg IV every 3 weeks with Neulasta support based on the suspicious of histology of squamous cell carcinoma.  He has a rough time with this treatment with significant fatigue and weakness as well as myalgia and arthralgia from the Neulasta injection. Based on the recent finding on the molecular studies was positive for KRAS G12C mutation, his histology is likely adenocarcinoma and he was switched to carboplatin for AUC of 5, Alimta 500 Mg/M2 and Keytruda 200 Mg IV every 3 weeks. He is status post 8 cycles.  Starting from cycle #3, the patient's carboplatin was reduced to AUC of 4 and Alimta was reduced to 400 mg per metered square.  He continued to have pancytopenia which required Granix injections and blood transfusions.  Alimta was permanently discontinued after cycle #5 due to continued pancytopenia for which he was hospitalized.    The patient presently is now on single agent immunotherapy with Keytruda.  He developed immunotherapy mediated rash but opted to continue with treatment due to his intolerance to alternative options with chemotherapy.  However, since he was last seen, his rash significantly worsened and he would like  to hold treatment while undergoing treatment for the rash.   The patient recently had a restaging CT scan performed.  Dr. Julien Nordmann personally and independently reviewed the scan discussed results with the patient today.  The scan showed no evidence of disease progression.  Dr. Julien Nordmann recommends  holding  treatment for now while the patient undergo a high-dose prednisone taper for his rash. The patient was instructed to take 5 tablets (100 mg) daily of prednisone for 7 days, followed by 4 tablets (80 mg daily) for 7 days, followed by 3 tablets (60 mg) daily for 7 days, followed by 2 tablets (40 mg) daily for 7 days, followed by 1 tablet daily for 7 days followed by half a tablet daily for 7 days.  The patient confirms that he is no longer taking Protonix or Nexium.  I have sent a prescription for Prilosec to his pharmacy to reduce the risk of gastritis.  The patient was also instructed to take the prednisone in the morning due to it causing insomnia.  Also discussed that this can cause increased appetite and fluid retention.  We will see him back for follow-up visit in 4 weeks for evaluation and consideration of resuming treatment at that time.   I have also sent a prescription for Kenalog cream to the patient's pharmacy for symptomatic relief of his itching.  The patient was advised to call immediately if he has any concerning symptoms in the interval. The patient voices understanding of current disease status and treatment options and is in agreement with the current care plan. All questions were answered. The patient knows to call the clinic with any problems, questions or concerns. We can certainly see the patient much sooner if necessary   No orders of the defined types were placed in this encounter.     L , PA-C 04/21/21  ADDENDUM: Hematology/Oncology Attending: I had a face-to-face encounter with the patient today.  I reviewed his record, lab, scan and recommended his care plan.  This is a very pleasant 66 years old white male diagnosed with stage IV (T3, N2, M1 C) non-small cell lung cancer likely adenocarcinoma based on the presence of KRAS G12C mutation diagnosed in July 2022.  The patient is started initially systemic chemotherapy with carboplatin, paclitaxel and  Keytruda based on the suspicious of squamous cell histology on the pathology report.  His treatment was switched to carboplatin, Alimta and Keytruda for 4 more cycles.  The patient tolerated his treatment well except for pancytopenia from chemotherapy which was discontinued and the patient continued on single agent Keytruda for 4 more cycles. He has been tolerating this treatment well except for the worsening skin rash.  He declined to stop the treatment in the past because of the grade 2-3 skin rash and he was handling it with local treatment with hydrocortisone creams and Benadryl. He had repeat CT scan of the chest, abdomen and pelvis performed recently.  I personally and independently reviewed the scan and discussed the results with the patient and his sister. His scan showed further improvement of his disease with no concerning findings for progression. I strongly recommend for the patient to hold his treatment with Keytruda at this point until improvement of the skin rash.  We will start the patient on a tapered dose of prednisone to be tapered over the next several weeks. If the patient has significant improvement in his rash and is willing to resume the treatment again we will consider it in the future. We will  see him back for follow-up visit in 1 months for reevaluation and discussion of his treatment options based on his improvement of the rash. The patient and his sister agreed to the current plan. He was advised to call immediately if he has any other concerning symptoms in the interval. The total time spent in the appointment was 40 minutes. Disclaimer: This note was dictated with voice recognition software. Similar sounding words can inadvertently be transcribed and may be missed upon review. Eilleen Kempf, MD 04/21/21

## 2021-04-21 ENCOUNTER — Other Ambulatory Visit: Payer: Self-pay

## 2021-04-21 ENCOUNTER — Inpatient Hospital Stay: Payer: PPO

## 2021-04-21 ENCOUNTER — Telehealth: Payer: Self-pay | Admitting: *Deleted

## 2021-04-21 ENCOUNTER — Encounter: Payer: Self-pay | Admitting: Internal Medicine

## 2021-04-21 ENCOUNTER — Inpatient Hospital Stay (HOSPITAL_BASED_OUTPATIENT_CLINIC_OR_DEPARTMENT_OTHER): Payer: PPO | Admitting: Physician Assistant

## 2021-04-21 VITALS — BP 126/83 | HR 88 | Temp 98.0°F | Resp 16 | Wt 224.9 lb

## 2021-04-21 DIAGNOSIS — Z95828 Presence of other vascular implants and grafts: Secondary | ICD-10-CM

## 2021-04-21 DIAGNOSIS — C3491 Malignant neoplasm of unspecified part of right bronchus or lung: Secondary | ICD-10-CM | POA: Diagnosis not present

## 2021-04-21 DIAGNOSIS — R21 Rash and other nonspecific skin eruption: Secondary | ICD-10-CM | POA: Diagnosis not present

## 2021-04-21 DIAGNOSIS — L270 Generalized skin eruption due to drugs and medicaments taken internally: Secondary | ICD-10-CM

## 2021-04-21 DIAGNOSIS — Z79899 Other long term (current) drug therapy: Secondary | ICD-10-CM | POA: Diagnosis not present

## 2021-04-21 DIAGNOSIS — Z452 Encounter for adjustment and management of vascular access device: Secondary | ICD-10-CM | POA: Diagnosis not present

## 2021-04-21 DIAGNOSIS — C3431 Malignant neoplasm of lower lobe, right bronchus or lung: Secondary | ICD-10-CM | POA: Diagnosis not present

## 2021-04-21 DIAGNOSIS — C7972 Secondary malignant neoplasm of left adrenal gland: Secondary | ICD-10-CM | POA: Diagnosis not present

## 2021-04-21 LAB — CMP (CANCER CENTER ONLY)
ALT: 14 U/L (ref 0–44)
AST: 20 U/L (ref 15–41)
Albumin: 4.2 g/dL (ref 3.5–5.0)
Alkaline Phosphatase: 53 U/L (ref 38–126)
Anion gap: 6 (ref 5–15)
BUN: 22 mg/dL (ref 8–23)
CO2: 30 mmol/L (ref 22–32)
Calcium: 9.4 mg/dL (ref 8.9–10.3)
Chloride: 101 mmol/L (ref 98–111)
Creatinine: 1.33 mg/dL — ABNORMAL HIGH (ref 0.61–1.24)
GFR, Estimated: 59 mL/min — ABNORMAL LOW (ref >60)
Glucose, Bld: 120 mg/dL — ABNORMAL HIGH (ref 70–99)
Potassium: 3.3 mmol/L — ABNORMAL LOW (ref 3.5–5.1)
Sodium: 137 mmol/L (ref 135–145)
Total Bilirubin: 0.5 mg/dL (ref 0.3–1.2)
Total Protein: 7.5 g/dL (ref 6.5–8.1)

## 2021-04-21 LAB — CBC WITH DIFFERENTIAL (CANCER CENTER ONLY)
Abs Immature Granulocytes: 0.01 10*3/uL (ref 0.00–0.07)
Basophils Absolute: 0 10*3/uL (ref 0.0–0.1)
Basophils Relative: 1 %
Eosinophils Absolute: 0.4 10*3/uL (ref 0.0–0.5)
Eosinophils Relative: 9 %
HCT: 32.6 % — ABNORMAL LOW (ref 39.0–52.0)
Hemoglobin: 11.2 g/dL — ABNORMAL LOW (ref 13.0–17.0)
Immature Granulocytes: 0 %
Lymphocytes Relative: 24 %
Lymphs Abs: 1 10*3/uL (ref 0.7–4.0)
MCH: 33.3 pg (ref 26.0–34.0)
MCHC: 34.4 g/dL (ref 30.0–36.0)
MCV: 97 fL (ref 80.0–100.0)
Monocytes Absolute: 0.5 10*3/uL (ref 0.1–1.0)
Monocytes Relative: 13 %
Neutro Abs: 2.3 10*3/uL (ref 1.7–7.7)
Neutrophils Relative %: 53 %
Platelet Count: 245 10*3/uL (ref 150–400)
RBC: 3.36 MIL/uL — ABNORMAL LOW (ref 4.22–5.81)
RDW: 12.6 % (ref 11.5–15.5)
WBC Count: 4.3 10*3/uL (ref 4.0–10.5)
nRBC: 0 % (ref 0.0–0.2)

## 2021-04-21 LAB — TSH: TSH: 0.916 u[IU]/mL (ref 0.320–4.118)

## 2021-04-21 MED ORDER — SODIUM CHLORIDE 0.9% FLUSH
10.0000 mL | INTRAVENOUS | Status: AC | PRN
  Administered 2021-04-21: 10 mL

## 2021-04-21 MED ORDER — PREDNISONE 20 MG PO TABS: ORAL_TABLET | ORAL | 0 refills | Status: DC

## 2021-04-21 MED ORDER — OMEPRAZOLE 20 MG PO CPDR: 20.0000 mg | DELAYED_RELEASE_CAPSULE | Freq: Every day | ORAL | 2 refills | Status: DC

## 2021-04-21 MED ORDER — TRIAMCINOLONE ACETONIDE 0.1 % EX CREA: 1.0000 "application " | TOPICAL_CREAM | Freq: Two times a day (BID) | CUTANEOUS | 0 refills | Status: DC

## 2021-04-21 NOTE — Telephone Encounter (Signed)
Connected with [name] and sister [name] today post provider visit as requested per collaborative.   [name] signed authorization required to complete forms.  Cancer policy also requires including pathology report with attending physician statement, billing and travel log for reimbursement.   Provided Billing and (SW) H.I.M. contact phone numbers.  [name] reports "Insurance will accept insurance (EOB) Explanation of Benefit letters we need provider information and pathology to return claim."   Advised this nurse will expedite form completion for provider review and signature, provide pathology with report authorization for for use/disclosure to return form to his address as requested.   [place] [id number]    While seeing CHUKWUMA STRAUS waving hands around his head, pacing sub-wait area echoing "I can't pay these bills" this nurse advised he focus only on self care, everything will be okay and think positive thoughts.     Per [name], "He only receives $1700.00 annually.  We have not connected with financial advocates but may need to.  No, I am not his P.O.A."

## 2021-04-21 NOTE — Progress Notes (Signed)
Called patient's sister after receiving message from [facility] regarding financial concerns. Introduced myself as Arboriculturist and to offer available resources. Discussed one-time $1000 Radio broadcast assistant to assist with personal expenses while going through treatment. Advised what is needed to apply and asked if she could bring at next appointment. She states that has changed and he will not be in for 4 weeks. She will call me back with new appointment date so that grant process can be completed.  She has my contact name and number for any additional financial questions or concerns.

## 2021-04-23 ENCOUNTER — Encounter: Payer: Self-pay | Admitting: Internal Medicine

## 2021-04-23 NOTE — Progress Notes (Signed)
Patient's sister [name] called to schedule appointment to complete [employer] process. Will see patient at next visit 05/20/21 at 12:15 before appointments. Advised what is needed. She verbalized understanding.  They have my card for any additional financial questions or concerns.

## 2021-04-28 ENCOUNTER — Inpatient Hospital Stay: Payer: PPO

## 2021-05-05 ENCOUNTER — Inpatient Hospital Stay: Payer: PPO

## 2021-05-10 ENCOUNTER — Other Ambulatory Visit: Payer: Self-pay | Admitting: Cardiology

## 2021-05-12 ENCOUNTER — Inpatient Hospital Stay: Payer: PPO | Admitting: Internal Medicine

## 2021-05-12 ENCOUNTER — Inpatient Hospital Stay: Payer: PPO

## 2021-05-17 ENCOUNTER — Encounter (HOSPITAL_COMMUNITY): Payer: Self-pay | Admitting: Radiology

## 2021-05-17 ENCOUNTER — Emergency Department (HOSPITAL_COMMUNITY): Payer: PPO

## 2021-05-17 ENCOUNTER — Emergency Department (HOSPITAL_COMMUNITY)
Admission: EM | Admit: 2021-05-17 | Discharge: 2021-05-17 | Disposition: A | Discharge status: Home / Self Care | Payer: PPO | Attending: Emergency Medicine | Admitting: Emergency Medicine

## 2021-05-17 ENCOUNTER — Telehealth: Payer: Self-pay | Admitting: *Deleted

## 2021-05-17 DIAGNOSIS — R0602 Shortness of breath: Secondary | ICD-10-CM | POA: Diagnosis not present

## 2021-05-17 DIAGNOSIS — E871 Hypo-osmolality and hyponatremia: Secondary | ICD-10-CM | POA: Diagnosis not present

## 2021-05-17 DIAGNOSIS — I509 Heart failure, unspecified: Secondary | ICD-10-CM | POA: Diagnosis not present

## 2021-05-17 DIAGNOSIS — R519 Headache, unspecified: Secondary | ICD-10-CM | POA: Diagnosis not present

## 2021-05-17 DIAGNOSIS — E876 Hypokalemia: Secondary | ICD-10-CM

## 2021-05-17 DIAGNOSIS — M7989 Other specified soft tissue disorders: Secondary | ICD-10-CM | POA: Diagnosis not present

## 2021-05-17 DIAGNOSIS — R600 Localized edema: Secondary | ICD-10-CM | POA: Diagnosis not present

## 2021-05-17 DIAGNOSIS — R059 Cough, unspecified: Secondary | ICD-10-CM | POA: Insufficient documentation

## 2021-05-17 DIAGNOSIS — R0609 Other forms of dyspnea: Secondary | ICD-10-CM | POA: Diagnosis present

## 2021-05-17 DIAGNOSIS — R609 Edema, unspecified: Secondary | ICD-10-CM

## 2021-05-17 LAB — CBC WITH DIFFERENTIAL/PLATELET
Abs Immature Granulocytes: 0.16 10*3/uL — ABNORMAL HIGH (ref 0.00–0.07)
Basophils Absolute: 0 10*3/uL (ref 0.0–0.1)
Basophils Relative: 0 %
Eosinophils Absolute: 0.1 10*3/uL (ref 0.0–0.5)
Eosinophils Relative: 1 %
HCT: 33 % — ABNORMAL LOW (ref 39.0–52.0)
Hemoglobin: 10.8 g/dL — ABNORMAL LOW (ref 13.0–17.0)
Immature Granulocytes: 3 %
Lymphocytes Relative: 30 %
Lymphs Abs: 1.9 10*3/uL (ref 0.7–4.0)
MCH: 34.1 pg — ABNORMAL HIGH (ref 26.0–34.0)
MCHC: 32.7 g/dL (ref 30.0–36.0)
MCV: 104.1 fL — ABNORMAL HIGH (ref 80.0–100.0)
Monocytes Absolute: 0.5 10*3/uL (ref 0.1–1.0)
Monocytes Relative: 8 %
Neutro Abs: 3.7 10*3/uL (ref 1.7–7.7)
Neutrophils Relative %: 58 %
Platelets: 129 10*3/uL — ABNORMAL LOW (ref 150–400)
RBC: 3.17 MIL/uL — ABNORMAL LOW (ref 4.22–5.81)
RDW: 13.6 % (ref 11.5–15.5)
WBC: 6.3 10*3/uL (ref 4.0–10.5)
nRBC: 0.5 % — ABNORMAL HIGH (ref 0.0–0.2)

## 2021-05-17 LAB — BASIC METABOLIC PANEL
Anion gap: 6 (ref 5–15)
BUN: 26 mg/dL — ABNORMAL HIGH (ref 8–23)
CO2: 28 mmol/L (ref 22–32)
Calcium: 7 mg/dL — ABNORMAL LOW (ref 8.9–10.3)
Chloride: 105 mmol/L (ref 98–111)
Creatinine, Ser: 0.74 mg/dL (ref 0.61–1.24)
GFR, Estimated: >60 mL/min (ref >60)
Glucose, Bld: 81 mg/dL (ref 70–99)
Potassium: 2.8 mmol/L — ABNORMAL LOW (ref 3.5–5.1)
Sodium: 139 mmol/L (ref 135–145)

## 2021-05-17 LAB — BRAIN NATRIURETIC PEPTIDE: B Natriuretic Peptide: 55.8 pg/mL (ref 0.0–100.0)

## 2021-05-17 IMAGING — CR DG CHEST 2V
3 series · 3 of 3 positions shown · non-contrast
Comparison: [DATE]

CLINICAL DATA: Leg swelling.

EXAM:
CHEST - 2 VIEW

[x chest ap (1 of 2)]
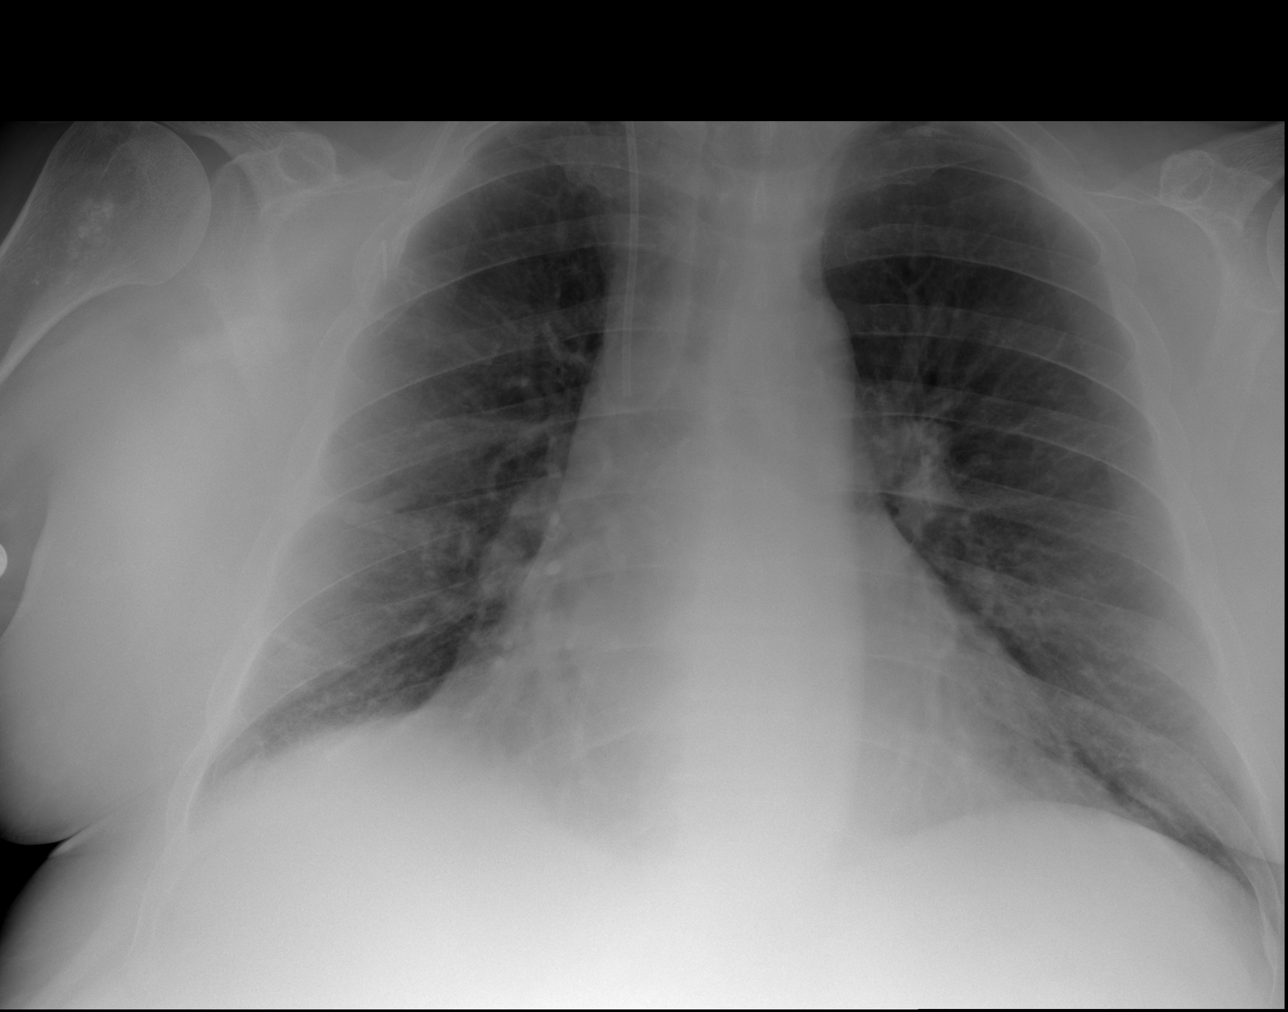

[x chest ap (2 of 2)]
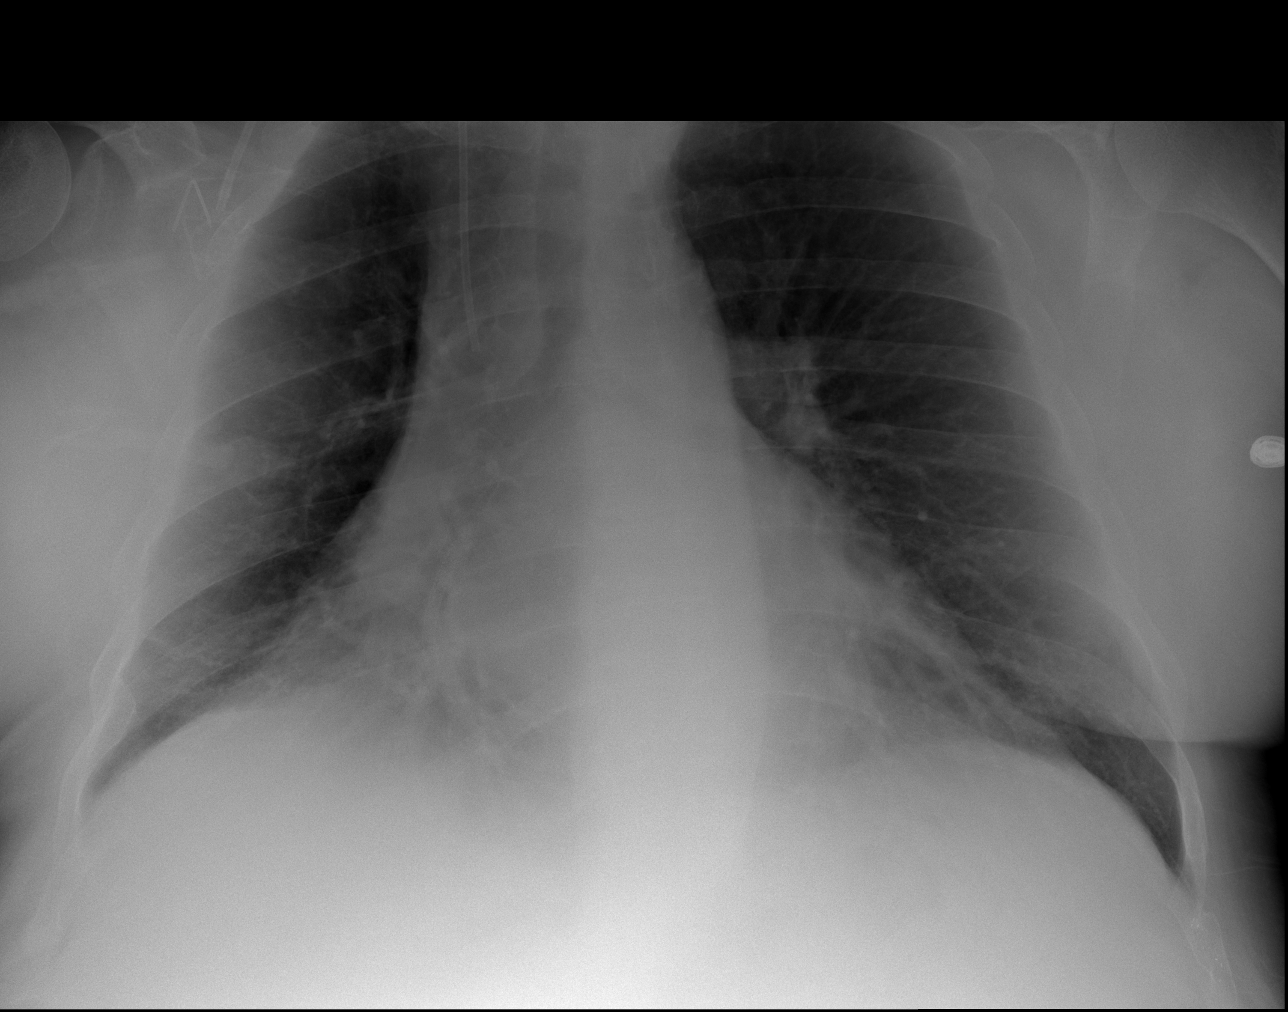

[w chest lat]
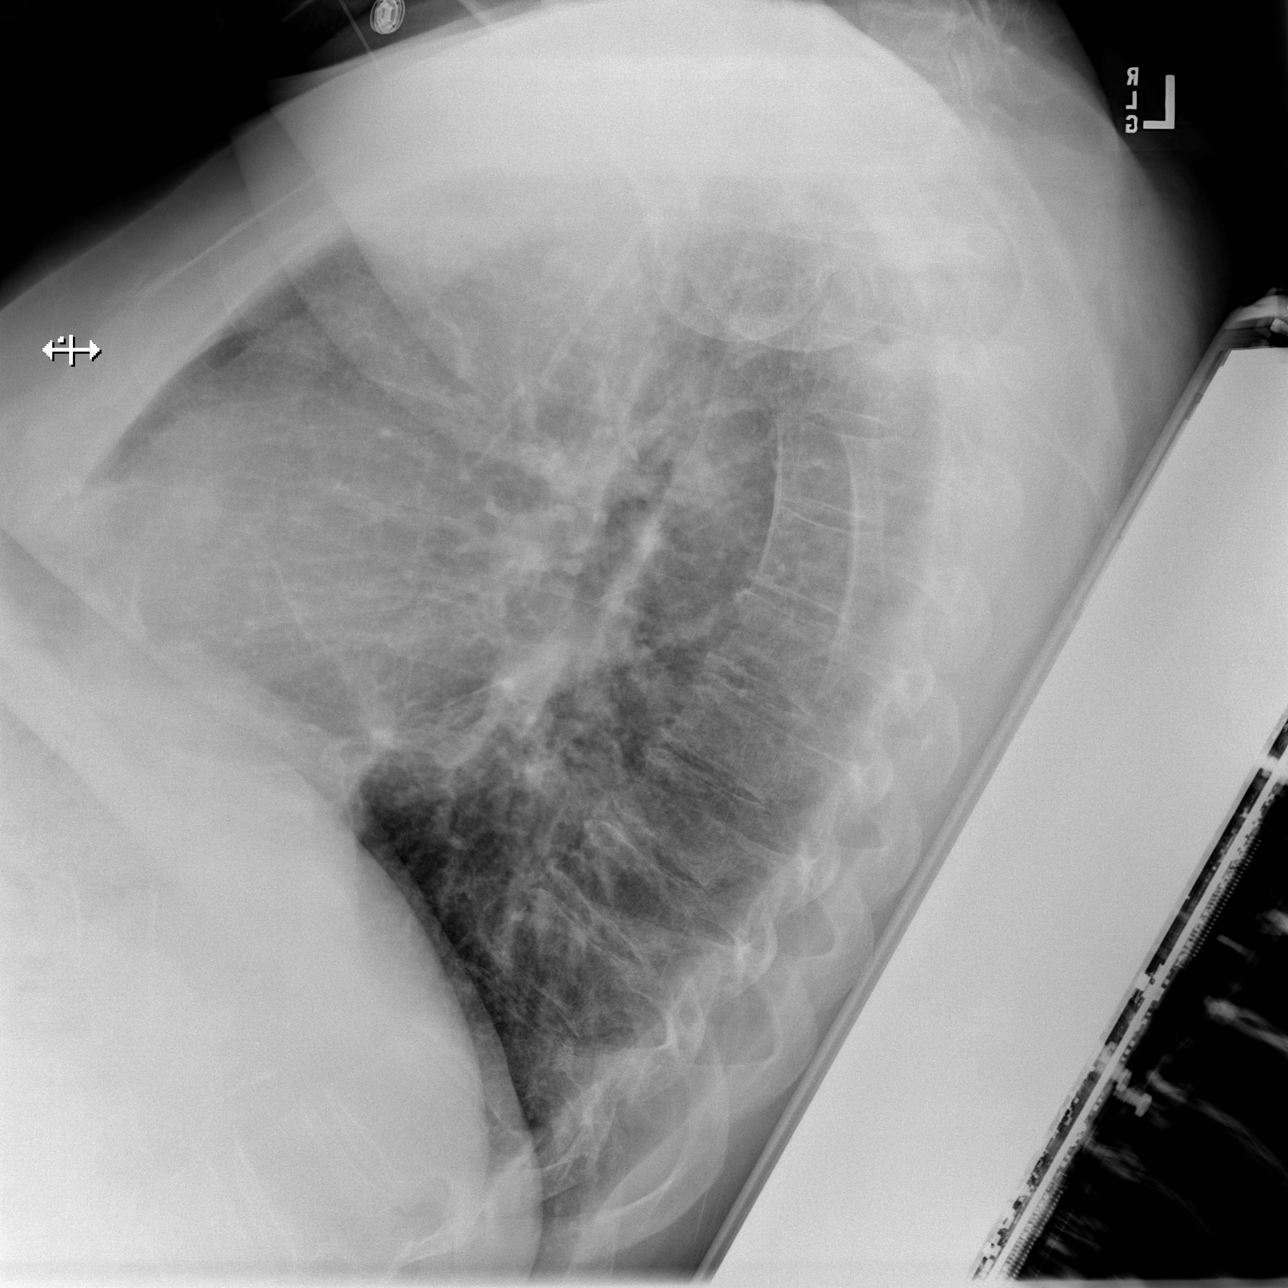

[3 of 3 positions shown; findings below may reference images not displayed]

FINDINGS: The lungs are clear without focal pneumonia, edema, pneumothorax or
pleural effusion. Cardiopericardial silhouette is at upper limits of
normal for size. Right Port-A-Cath again noted. Bones are
demineralized.
IMPRESSION: No active cardiopulmonary disease.

## 2021-05-17 IMAGING — CT CT HEAD W/O CM
3 series · 14 of 47 positions shown, 16 images · non-contrast
Comparison: MRI [DATE].

CLINICAL DATA: Headache, chronic, new features or increased
frequency



[Series 2: head wo · axial · 0.47mm/px · z∈[-98,+47]mm · 8 of 35 slices shown, 10 images]
[im 3/35  brain]
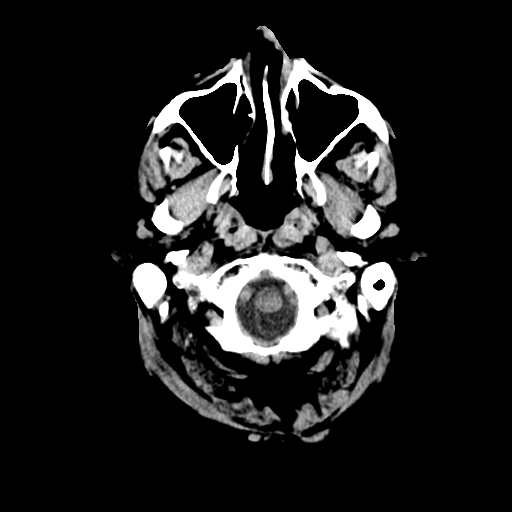
[im 3/35  bone]
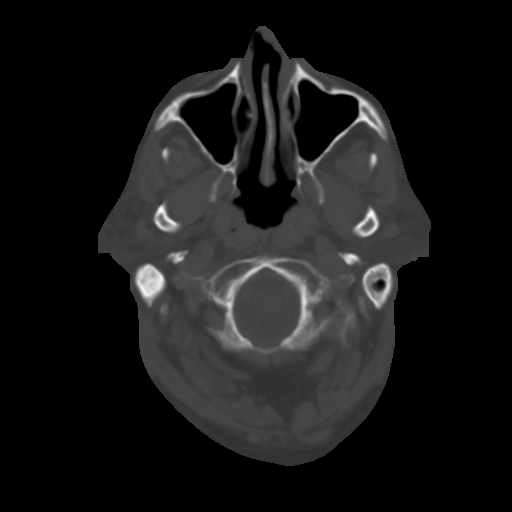
[im 8/35  brain]
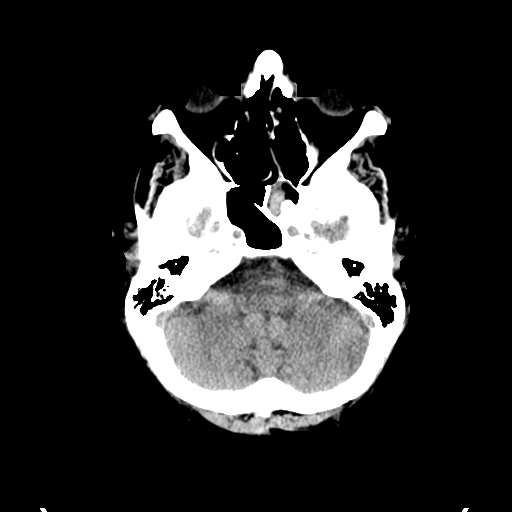
[im 11/35  brain]
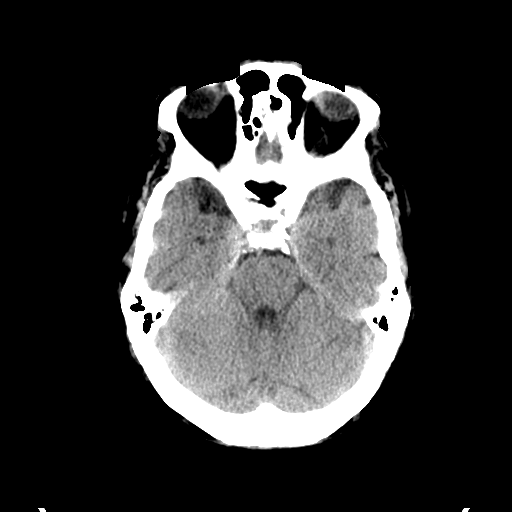
[im 16/35  brain]
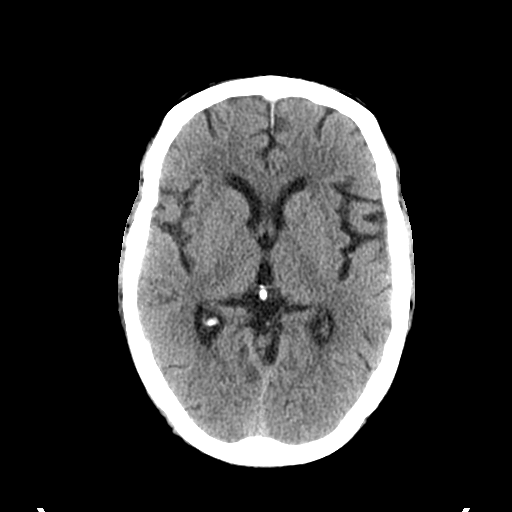
[im 19/35  brain]
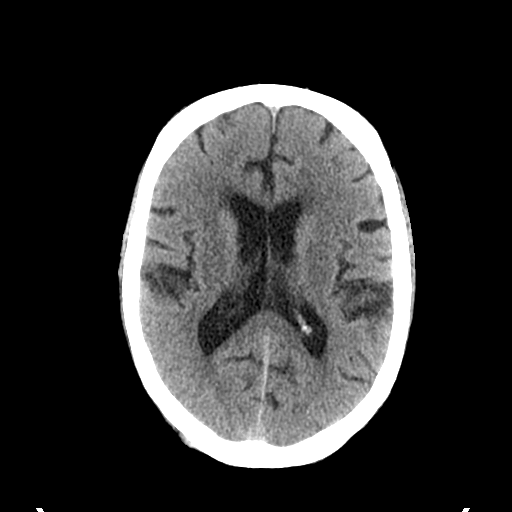
[im 19/35  bone]
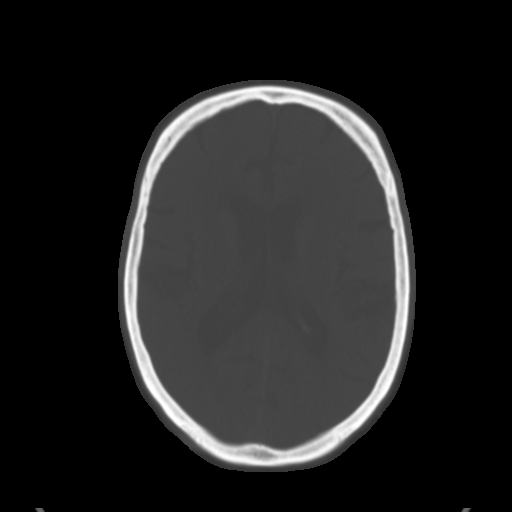
[im 24/35  brain]
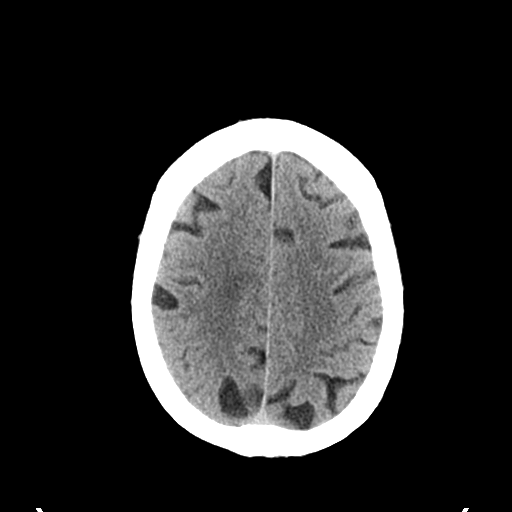
[im 27/35  brain]
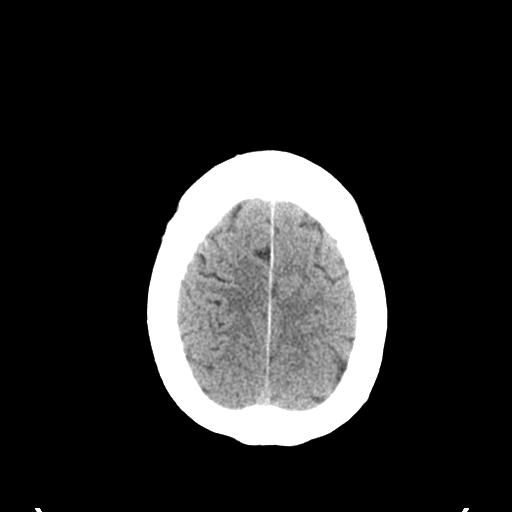
[im 32/35  brain]
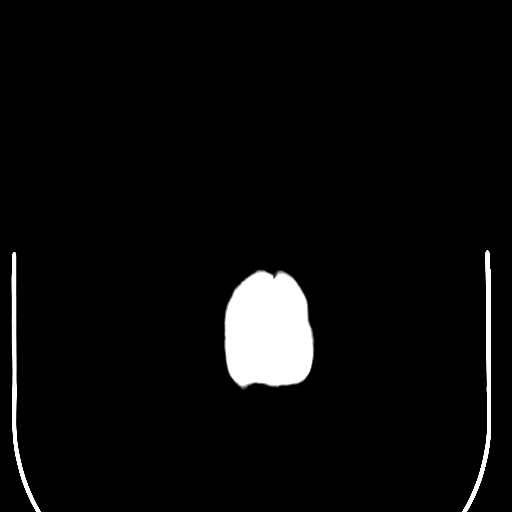

[Series 5: coronal soft tissue · coronal · 0.32mm/px · 3 of 83 slices shown]
[im 28/83  brain]
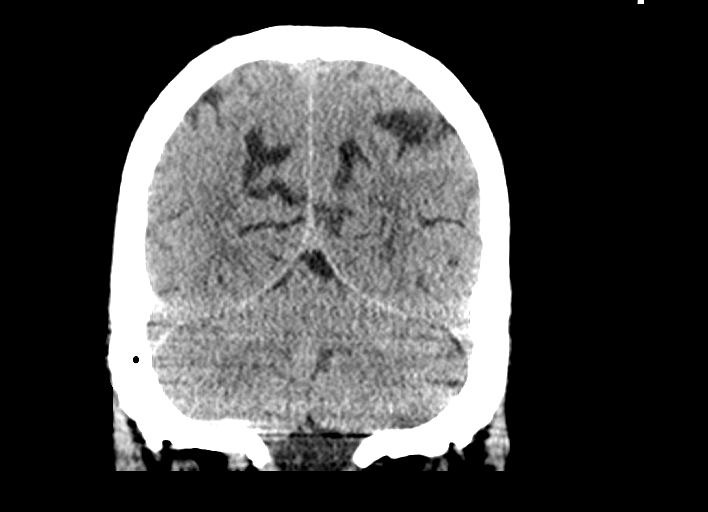
[im 37/83  brain]
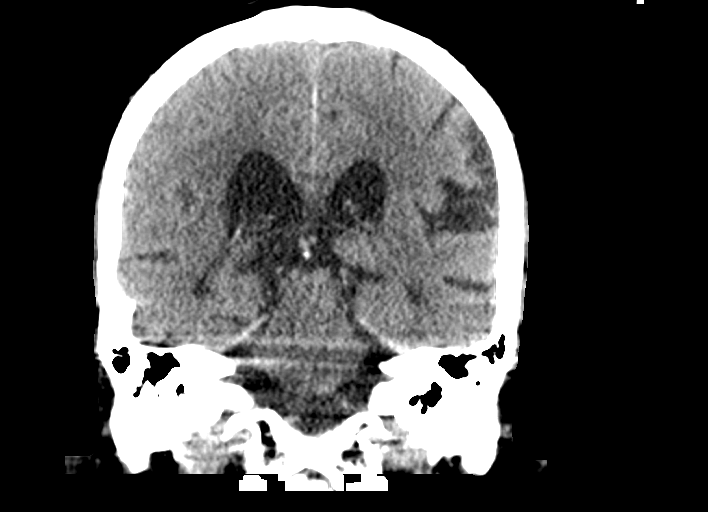
[im 46/83  brain]
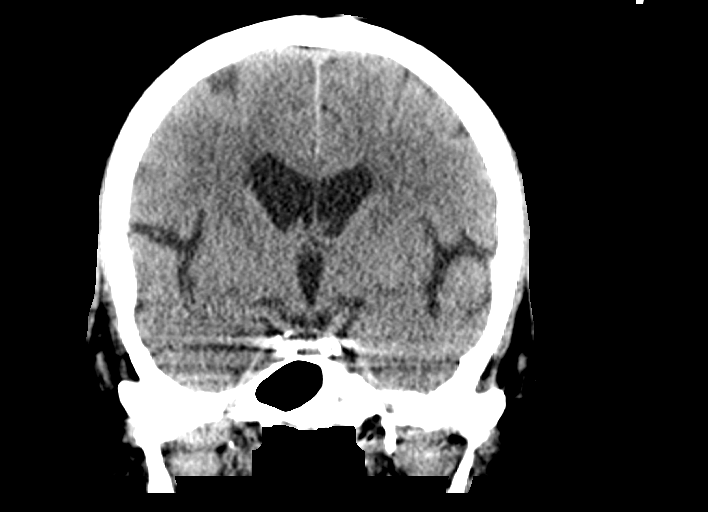

[Series 6: sagittal soft tissue · sagittal · 0.32mm/px · 3 of 74 slices shown]
[im 25/74  brain]
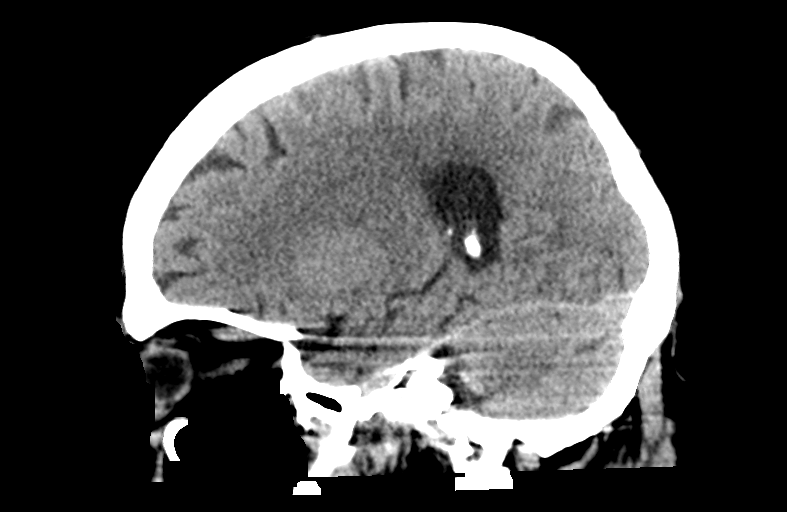
[im 37/74  brain]
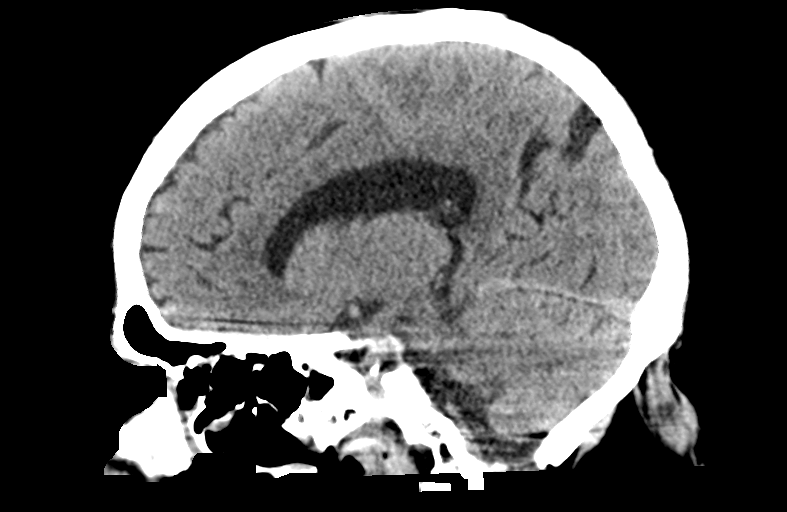
[im 49/74  brain]
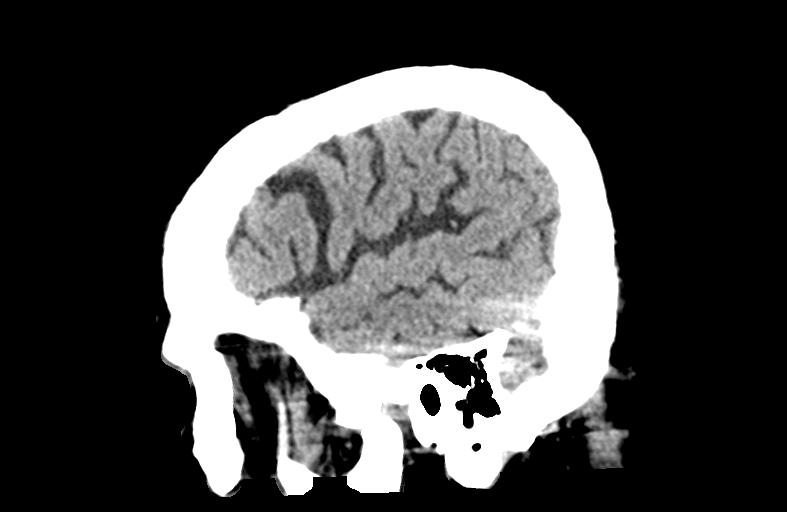

[14 of 47 positions shown; findings below may reference images not displayed]

FINDINGS: Brain: No evidence of acute infarction, hemorrhage, hydrocephalus,
extra-axial collection or mass lesion/mass effect. Mild atrophy.
Mild scattered white matter hypodensities, nonspecific but
compatible with chronic microvascular ischemic disease.

Vascular: No hyperdense vessel identified. Calcific intracranial
sclerosis.

Skull: No acute fracture.

Sinuses/Orbits: Scattered ethmoid air cells left sphenoid sinus
mucosal thickening. No acute orbital findings.

Other: Trace right mastoid effusion
IMPRESSION: 1. No evidence of acute intracranial abnormality.
2. Mild atrophy and chronic microvascular ischemic disease.
3. Paranasal sinus mucosal thickening, detailed above.

## 2021-05-17 MED ORDER — POTASSIUM CHLORIDE ER 10 MEQ PO TBCR
10.0000 meq | EXTENDED_RELEASE_TABLET | Freq: Two times a day (BID) | ORAL | 0 refills | Status: DC
Start: 2021-05-17 — End: 2021-06-25

## 2021-05-17 MED ORDER — FUROSEMIDE 10 MG/ML IJ SOLN
40.0000 mg | Freq: Once | INTRAMUSCULAR | Status: AC
  Administered 2021-05-17: 40 mg via INTRAVENOUS
  Filled 2021-05-17: qty 4

## 2021-05-17 MED ORDER — SODIUM CHLORIDE 0.9 % IV SOLN: INTRAVENOUS | Status: DC

## 2021-05-17 MED ORDER — ACETAMINOPHEN 325 MG PO TABS
650.0000 mg | ORAL_TABLET | Freq: Once | ORAL | Status: AC
  Administered 2021-05-17: 650 mg via ORAL
  Filled 2021-05-17: qty 2

## 2021-05-17 MED ORDER — POTASSIUM CHLORIDE CRYS ER 20 MEQ PO TBCR
60.0000 meq | EXTENDED_RELEASE_TABLET | Freq: Once | ORAL | Status: AC
  Administered 2021-05-17: 60 meq via ORAL
  Filled 2021-05-17: qty 3

## 2021-05-17 MED ORDER — HEPARIN SOD (PORK) LOCK FLUSH 100 UNIT/ML IV SOLN
500.0000 [IU] | Freq: Once | INTRAVENOUS | Status: AC
  Administered 2021-05-17: 500 [IU]
  Filled 2021-05-17: qty 5

## 2021-05-17 NOTE — Discharge Instructions (Addendum)
Your head CT today was negative.  Follow-up in the cancer center for a repeat potassium ?

## 2021-05-17 NOTE — ED Provider Notes (Signed)
?[facility] DEPT ?Provider Note ? ? ?CSN: [id number] ?Arrival date & time: 05/17/21  0816 ? ?  ? ?History ? ?Chief Complaint  ?Patient presents with  ? Leg Swelling  ? ? ?[name] [name] is a 66 y.o. male. ? ?66 year old male presents with several days of increased lower extremity edema.  Patient has a history of CHF and is also noted increased dyspnea on exertion.  Has had nonproductive cough.  No fever or chills.  No chest pain or chest pressure.  Patient increase his dose of Lasix from 40 mg to 80 mg a day.  Still has not increasing edema.  No pleuritic pain in his chest. ? ? ?  ? ?Home Medications ?Prior to Admission medications   ?Medication Sig Start Date End Date Taking? Authorizing Provider  ?acetaminophen (TYLENOL) 500 MG tablet Take 500-1,000 mg by mouth every 6 (six) hours as needed for moderate pain or headache.    [provider]  ?allopurinol (ZYLOPRIM) 100 MG tablet Take 200 mg daily by mouth.    [provider]  ?BRILINTA 60 MG TABS tablet TAKE 1 TABLET(60 MG) BY MOUTH TWICE DAILY 05/10/21   [name], MD  ?calcium carbonate (TUMS EX) 750 MG chewable tablet Chew 2-4 tablets by mouth daily as needed for heartburn.     [provider]  ?clonazePAM (KLONOPIN) 1 MG tablet Take 1 mg by mouth at bedtime as needed for anxiety. 09/29/20   [provider]  ?docusate sodium (COLACE) 100 MG capsule Take 1 capsule (100 mg total) by mouth 2 (two) times daily. 02/13/21   [name], MD  ?feeding supplement (ENSURE ENLIVE / ENSURE PLUS) LIQD Take 237 mLs by mouth 2 (two) times daily between meals. ?Patient not taking: Reported on 03/10/2021 02/13/21   [name], MD  ?fluticasone [facility]) 50 MCG/ACT nasal spray Place 1 spray into both nostrils daily as needed for allergies.     [provider]  ?folic acid (FOLVITE) 1 MG tablet Take 1 tablet (1 mg total) by mouth daily. During treatment with Alimta (chemo) 10/28/20   [name], MD  ?furosemide (LASIX) 20 MG tablet Take 1 tablet (20 mg total) by mouth daily as needed. ?Patient not taking: Reported on 03/10/2021 08/31/20   [name], MD  ?Hydrocortisone Acetate 2.5 % CREA Apply to the skin rash area twice daily. 03/31/21   [name], MD  ?hydrOXYzine (VISTARIL) 50 MG capsule Take 50 mg by mouth 3 (three) times daily as needed for itching. 05/23/19   [provider]  ?metoprolol tartrate (LOPRESSOR) 25 MG tablet TAKE 1 TABLET(25 MG) BY MOUTH TWICE DAILY ?Patient not taking: Reported on 02/18/2021 11/20/19   [name], MD  ?morphine (MS CONTIN) 60 MG 12 hr tablet 60 mg every 12 (twelve) hours. 12/21/20   [provider]  ?morphine (MSIR) 30 MG tablet Take 30 mg by mouth every 6 (six) hours as needed. ?Patient not taking: Reported on 03/10/2021 11/13/20   [provider]  ?nitroGLYCERIN (NITROSTAT) 0.4 MG SL tablet Place 1 tablet (0.4 mg total) under the tongue every 5 (five) minutes as needed for chest pain. X 3 doses ?Patient not taking: Reported on 02/18/2021 12/15/17   [name], MD  ?omeprazole (PRILOSEC) 20 MG capsule Take 1 capsule (20 mg total) by mouth daily. 04/21/21   Heilingoetter, Cassandra L, PA-C  ?ondansetron (ZOFRAN) 8 MG tablet Take 1 tablet (8 mg total) by mouth every 8 (eight) hours as  needed for nausea or vomiting. Starting 3 days after chemotherapy ?Patient not taking: Reported on 03/10/2021 12/30/20   Heilingoetter, Cassandra L, PA-C  ?predniSONE (DELTASONE) 20 MG tablet Please take 5 tablets (100 mg) daily for 7 days, then take 4 tablets (80 mg) daily for 7 days, then take 3 (60 mg) daily for 7 days, then take 2 (40 mg) daily for 7 days, then 1 tablet daily (20 mg) for 7 days, then 1/2 a tablet (10 mg) daily for 7 days. 04/21/21   Heilingoetter, Cassandra L, PA-C  ?prochlorperazine (COMPAZINE) 10 MG tablet Take by mouth daily. 03/23/21   [provider]  ?rosuvastatin (CRESTOR) 20 MG tablet TAKE 1 TABLET(20 MG) BY  MOUTH DAILY ?Patient taking differently: Take 20 mg by mouth daily. 11/11/20   [name], MD  ?triamcinolone cream (KENALOG) 0.1 % Apply 1 application topically 2 (two) times daily. 04/21/21   Heilingoetter, Cassandra L, PA-C  ?   ? ?Allergies    ?Fish allergy, Iohexol, and Ivp dye [iodinated contrast media]   ? ?Review of Systems   ?Review of Systems  ?All other systems reviewed and are negative. ? ?Physical Exam ?Updated Vital Signs ?BP (!) 144/95   Pulse 78   Temp 98 ?F (36.7 ?C) (Oral)   Resp 20   Ht 1.727 m (5\' 8" )   Wt 118.4 kg   SpO2 100%   BMI 39.68 kg/m?  ?Physical Exam ?Vitals and nursing note reviewed.  ?Constitutional:   ?   General: He is not in acute distress. ?   Appearance: Normal appearance. He is well-developed. He is not toxic-appearing.  ?HENT:  ?   Head: Normocephalic and atraumatic.  ?Eyes:  ?   General: Lids are normal.  ?   Conjunctiva/sclera: Conjunctivae normal.  ?   Pupils: Pupils are equal, round, and reactive to light.  ?Neck:  ?   Thyroid: No thyroid mass.  ?   Trachea: No tracheal deviation.  ?Cardiovascular:  ?   Rate and Rhythm: Normal rate and regular rhythm.  ?   Heart sounds: Normal heart sounds. No murmur heard. ?  No gallop.  ?Pulmonary:  ?   Effort: Pulmonary effort is normal. No respiratory distress.  ?   Breath sounds: Normal breath sounds. No stridor. No decreased breath sounds, wheezing, rhonchi or rales.  ?Abdominal:  ?   General: There is no distension.  ?   Palpations: Abdomen is soft.  ?   Tenderness: There is no abdominal tenderness. There is no rebound.  ?Musculoskeletal:     ?   General: No tenderness. Normal range of motion.  ?   Cervical back: Normal range of motion and neck supple.  ?Lymphadenopathy:  ?   Comments: 2+ pitting edema bilateral  ?Skin: ?   General: Skin is warm and dry.  ?   Findings: No abrasion or rash.  ?Neurological:  ?   Mental Status: He is alert and oriented to person, place, and time. Mental status is at baseline.  ?   GCS: GCS eye  subscore is 4. GCS verbal subscore is 5. GCS motor subscore is 6.  ?   Cranial Nerves: No cranial nerve deficit.  ?   Sensory: No sensory deficit.  ?   Motor: Motor function is intact.  ?Psychiatric:     ?   Attention and Perception: Attention normal.     ?   Speech: Speech normal.     ?   Behavior: Behavior normal.  ? ? ?  ED Results / Procedures / Treatments   ?Labs ?(all labs ordered are listed, but only abnormal results are displayed) ?Labs Reviewed  ?BASIC METABOLIC PANEL  ?CBC WITH DIFFERENTIAL/PLATELET  ?BRAIN NATRIURETIC PEPTIDE  ? ? ?EKG ?None ? ?Radiology ?No results found. ? ?Procedures ?Procedures  ? ? ?Medications Ordered in ED ?Medications  ?0.9 %  sodium chloride infusion (has no administration in time range)  ? ? ?ED Course/ Medical Decision Making/ A&P ?  ?                        ?Medical Decision Making ?Amount and/or Complexity of Data Reviewed ?Labs: ordered. ?Radiology: ordered. ?ECG/medicine tests: ordered. ? ?Risk ?OTC drugs. ?Prescription drug management. ? ? ?Patient presented here complaining of lower extremity edema.  Concern for possible CHF.  Chest x-ray without evidence of failure per my interpretation reviewed.  Wife noted patient has been slightly more different as of several days.  Concern for possible intracranial abnormality given his history of cancer and head CT performed which was negative.  He is alert and oriented x4 at this time.  Was mildly hyponatremic with K of 2.8 and patient was given 60 mill equivalents oral potassium.  Prior to this, he had received IV Lasix for concern for possible fluid overload.  Plan at this point will be for patient to follow-up in the cancer center.  Discussed this with patient's wife and they are comfortable with this plan ? ? ? ? ? ? ? ?Final Clinical Impression(s) / ED Diagnoses ?Final diagnoses:  ?SOB (shortness of breath)  ? ? ?Rx / DC Orders ?ED Discharge Orders   ? ? None  ? ?  ? ? ?  ?Lacretia Leigh, MD ?05/17/21 1317 ? ?

## 2021-05-17 NOTE — Telephone Encounter (Signed)
Late entry for 04/22/2021. ?Patient signed authorization request to receive information to complete Protective life Insurance Claim on 04/22/2021.   ?Attending Physician Statement completed and returned to patient to file claim included [id number]; [id number] pathology reports, 09/11/2020 CT Chest AND 09/24/2020 [facility] PET report  ?Mailed above to address per EMR. ?[address] ?[place] [address]. ? ?

## 2021-05-17 NOTE — ED Triage Notes (Signed)
Pt states his legs started swelling 2 days ago. States he has been taking his lasix.  ?

## 2021-05-20 ENCOUNTER — Encounter: Payer: Self-pay | Admitting: Internal Medicine

## 2021-05-20 ENCOUNTER — Inpatient Hospital Stay (HOSPITAL_BASED_OUTPATIENT_CLINIC_OR_DEPARTMENT_OTHER): Payer: PPO | Admitting: Internal Medicine

## 2021-05-20 ENCOUNTER — Inpatient Hospital Stay: Payer: PPO | Attending: Physician Assistant

## 2021-05-20 ENCOUNTER — Other Ambulatory Visit: Payer: Self-pay

## 2021-05-20 ENCOUNTER — Inpatient Hospital Stay: Payer: PPO

## 2021-05-20 VITALS — BP 150/72 | HR 105 | Temp 97.9°F | Resp 20 | Ht 68.0 in | Wt 249.7 lb

## 2021-05-20 DIAGNOSIS — R531 Weakness: Secondary | ICD-10-CM | POA: Diagnosis not present

## 2021-05-20 DIAGNOSIS — G893 Neoplasm related pain (acute) (chronic): Secondary | ICD-10-CM | POA: Diagnosis not present

## 2021-05-20 DIAGNOSIS — M150 Primary generalized (osteo)arthritis: Secondary | ICD-10-CM | POA: Diagnosis not present

## 2021-05-20 DIAGNOSIS — C3431 Malignant neoplasm of lower lobe, right bronchus or lung: Secondary | ICD-10-CM | POA: Diagnosis not present

## 2021-05-20 DIAGNOSIS — C7972 Secondary malignant neoplasm of left adrenal gland: Secondary | ICD-10-CM | POA: Insufficient documentation

## 2021-05-20 DIAGNOSIS — C3491 Malignant neoplasm of unspecified part of right bronchus or lung: Secondary | ICD-10-CM | POA: Diagnosis not present

## 2021-05-20 DIAGNOSIS — C3490 Malignant neoplasm of unspecified part of unspecified bronchus or lung: Secondary | ICD-10-CM

## 2021-05-20 DIAGNOSIS — Z5112 Encounter for antineoplastic immunotherapy: Secondary | ICD-10-CM | POA: Diagnosis not present

## 2021-05-20 DIAGNOSIS — Z79899 Other long term (current) drug therapy: Secondary | ICD-10-CM | POA: Diagnosis not present

## 2021-05-20 DIAGNOSIS — Z452 Encounter for adjustment and management of vascular access device: Secondary | ICD-10-CM | POA: Insufficient documentation

## 2021-05-20 DIAGNOSIS — R5383 Other fatigue: Secondary | ICD-10-CM | POA: Insufficient documentation

## 2021-05-20 DIAGNOSIS — Z79891 Long term (current) use of opiate analgesic: Secondary | ICD-10-CM | POA: Diagnosis not present

## 2021-05-20 DIAGNOSIS — G894 Chronic pain syndrome: Secondary | ICD-10-CM | POA: Diagnosis not present

## 2021-05-20 LAB — CMP (CANCER CENTER ONLY)
ALT: 41 U/L (ref 0–44)
AST: 20 U/L (ref 15–41)
Albumin: 4.2 g/dL (ref 3.5–5.0)
Alkaline Phosphatase: 56 U/L (ref 38–126)
Anion gap: 7 (ref 5–15)
BUN: 27 mg/dL — ABNORMAL HIGH (ref 8–23)
CO2: 35 mmol/L — ABNORMAL HIGH (ref 22–32)
Calcium: 9.5 mg/dL (ref 8.9–10.3)
Chloride: 98 mmol/L (ref 98–111)
Creatinine: 1.27 mg/dL — ABNORMAL HIGH (ref 0.61–1.24)
GFR, Estimated: >60 mL/min (ref >60)
Glucose, Bld: 175 mg/dL — ABNORMAL HIGH (ref 70–99)
Potassium: 4.7 mmol/L (ref 3.5–5.1)
Sodium: 140 mmol/L (ref 135–145)
Total Bilirubin: 0.7 mg/dL (ref 0.3–1.2)
Total Protein: 7 g/dL (ref 6.5–8.1)

## 2021-05-20 LAB — CBC WITH DIFFERENTIAL (CANCER CENTER ONLY)
Abs Immature Granulocytes: 0.05 10*3/uL (ref 0.00–0.07)
Basophils Absolute: 0 10*3/uL (ref 0.0–0.1)
Basophils Relative: 0 %
Eosinophils Absolute: 0 10*3/uL (ref 0.0–0.5)
Eosinophils Relative: 0 %
HCT: 41.4 % (ref 39.0–52.0)
Hemoglobin: 13.5 g/dL (ref 13.0–17.0)
Immature Granulocytes: 1 %
Lymphocytes Relative: 8 %
Lymphs Abs: 0.5 10*3/uL — ABNORMAL LOW (ref 0.7–4.0)
MCH: 33.5 pg (ref 26.0–34.0)
MCHC: 32.6 g/dL (ref 30.0–36.0)
MCV: 102.7 fL — ABNORMAL HIGH (ref 80.0–100.0)
Monocytes Absolute: 0.3 10*3/uL (ref 0.1–1.0)
Monocytes Relative: 4 %
Neutro Abs: 6.2 10*3/uL (ref 1.7–7.7)
Neutrophils Relative %: 87 %
Platelet Count: 155 10*3/uL (ref 150–400)
RBC: 4.03 MIL/uL — ABNORMAL LOW (ref 4.22–5.81)
RDW: 13.8 % (ref 11.5–15.5)
WBC Count: 7.1 10*3/uL (ref 4.0–10.5)
nRBC: 0 % (ref 0.0–0.2)

## 2021-05-20 LAB — TSH: TSH: 0.931 u[IU]/mL (ref 0.320–4.118)

## 2021-05-20 MED ORDER — SODIUM CHLORIDE 0.9% FLUSH
10.0000 mL | INTRAVENOUS | Status: AC | PRN
  Administered 2021-05-20: 10 mL

## 2021-05-20 NOTE — Progress Notes (Signed)
Patient approved for one-time $1000 [employer] grant to assist with personal expenses while going through treatment. Patient signed grant paperwork at registration and received a copy of the approval letter and expense sheet along with the Outpatient Pharmacy information and my card for any additional financial questions or concerns. ? ?Called patient's sister and left voicemail to discuss via phone. ? ?She returned my call. Discussed expenses in detail and how they are covered. He received a gift card today per her request and I confirmed with registrar for him to receive. ? ?His sister has my card for any additional financial questions or concerns. ?

## 2021-05-20 NOTE — Progress Notes (Signed)
?    [facility] ?[phone]   [phone] ? ?OFFICE PROGRESS NOTE ? ?[name], MD ?[place] ?[place] [address] ? ?DIAGNOSIS: stage IV (T3, N2, M1c) non-small cell lung cancer, unable to differentiate between squamous cell carcinoma and adenocarcinoma. He presented with a large right lower lobe lung mass, enlarged adjacent subpleural lymph nodes, right hilar, and subcarinal adenopathy.  He also had a left adrenal gland metastasis.  He was diagnosed in July 2022.  Based on the recent molecular studies and the presence of KRAS G12C mutation, his histology is likely to be adenocarcinoma. ? ?DETECTED ALTERATION(S) / BIOMARKER(S) % CFDNA OR AMPLIFICATION ASSOCIATED FDA-APPROVED THERAPIES CLINICAL TRIAL AVAILABILITY ?KRASG12C ?5.5% ? ?Sotorasib ?Yes ?[id number] ?2.2% ?None  ?Yes ?  ?PRIOR THERAPY: 1) Palliative systemic chemotherapy with carboplatin for an AUC of 5, paclitaxel 175 mg/m?, and Keytruda 200 mg IV every 3 weeks with neulasta support. First dose on 10/08/20.  Status post 1 cycle. ?2) systemic chemotherapy with carboplatin for AUC of 5, Alimta 500 Mg/M2 and Keytruda 200 Mg IV every 3 weeks.  First dose 10/28/2020.  Status post 8 cycles.  Starting from cycle #5 he is on maintenance treatment with Keytruda every 3 weeks.  Alimta was also discontinued secondary to intolerance.  His treatment with Beryle Flock was discontinued after cycle #8 secondary to grade 3 skin rash. ?  ?CURRENT THERAPY: Observation. ? ? ?INTERVAL HISTORY: ?LENOX BINK 66 y.o. male returns to the clinic today for follow-up visit accompanied by his sister.  The patient is very hyper today secondary to his treatment with taper dose of prednisone over the last few weeks.  He has significant improvement in the skin rash on the upper and lower extremities.  He has been eating a lot recently and eating boxes of cakes and orange juice on daily basis.  He gained a lot of weight.  He  denied having any current chest pain, shortness of breath, cough or hemoptysis.  He denied having any fever or chills.  He has no nausea, vomiting, diarrhea or constipation.  He has no headache or visual changes.  He was seen at the emergency department few days ago complaining of confusion and visual changes.  He had CT of the head performed at that time that was unremarkable for any disease progression to the brain or any acute abnormalities.  The patient is here today for evaluation and repeat blood work and also close monitoring of the skin rash. ? ?MEDICAL HISTORY: ?Past Medical History:  ?Diagnosis Date  ? Adenomatous colon polyp   ? Anxiety   ? Anxiety disorder   ? Arthritis   ? "everywhere"   ? CAD S/P percutaneous coronary angioplasty 2006, 5/'18  ? a). 2006: Taxus DES to RCA; March '06 Cypher DES to LAD; EF 66%, LV gram normal;;. b). 07/2016: Inferior STEMI with 100% mRCA (Aspiration Thrombectomy & DES PCI Promus 3.85m x 38 mm). Patent LAD stent and patent LM and LCx.   ? Chronic pain syndrome   ? , as noted by handwritten note from primary physician   ? COPD (chronic obstructive pulmonary disease) ([place]   ? Depression   ? Dyslipidemia, goal LDL below 70   ? Dyspnea   ? with exertion, no oxygen  ? Fibromyalgia   ? FRACTURE, RIB, LEFT 11/15/2006  ? Qualifier: Diagnosis of  By: Drinkard MSN, FNP-C, SCollie Siad   ? GERD (gastroesophageal reflux disease)   ? on occas. uses [employer]  for heartburn   ? Headache   ? pt. remarks that he gets sinus headaches   ? History of migraine headaches   ? Hypertension, essential, benign   ? Lung cancer ([place])   ? Lung nodule 09/2020  ? right lower lobe  ? Neuromuscular disorder ([place])   ? L leg, nerve damage   ? Obesity, Class II, BMI 35-39.9   ? Pneumonia 2015  ? x 3  ? Tobacco abuse   ? ? ?ALLERGIES:  is allergic to fish allergy, iohexol, and ivp dye [iodinated contrast media]. ? ?MEDICATIONS:  ?Current Outpatient Medications  ?Medication Sig Dispense Refill  ? acetaminophen (TYLENOL)  500 MG tablet Take 500-1,000 mg by mouth every 6 (six) hours as needed for moderate pain or headache.    ? allopurinol (ZYLOPRIM) 100 MG tablet Take 200 mg daily by mouth.    ? BRILINTA 60 MG TABS tablet TAKE 1 TABLET(60 MG) BY MOUTH TWICE DAILY (Patient taking differently: Take 60 mg by mouth 2 (two) times daily.) 180 tablet 0  ? calcium carbonate (TUMS EX) 750 MG chewable tablet Chew 373-746 tablets by mouth daily as needed for heartburn.    ? clonazePAM (KLONOPIN) 1 MG tablet Take 1 mg by mouth at bedtime as needed for anxiety.    ? docusate sodium (COLACE) 100 MG capsule Take 1 capsule (100 mg total) by mouth 2 (two) times daily. 10 capsule 0  ? feeding supplement (ENSURE ENLIVE / ENSURE PLUS) LIQD Take 237 mLs by mouth 2 (two) times daily between meals. (Patient not taking: Reported on 03/10/2021) 237 mL 12  ? fluticasone (FLONASE) 50 MCG/ACT nasal spray Place 1 spray into both nostrils daily as needed for allergies.     ? folic acid (FOLVITE) 1 MG tablet Take 1 tablet (1 mg total) by mouth daily. During treatment with Alimta (chemo) 30 tablet 3  ? furosemide (LASIX) 20 MG tablet Take 1 tablet (20 mg total) by mouth daily as needed. (Patient not taking: Reported on 03/10/2021) 30 tablet 11  ? Hydrocortisone Acetate 2.5 % CREA Apply to the skin rash area twice daily. 28.4 g 1  ? hydrOXYzine (VISTARIL) 50 MG capsule Take 50 mg by mouth 3 (three) times daily as needed for itching.    ? metoprolol tartrate (LOPRESSOR) 25 MG tablet TAKE 1 TABLET(25 MG) BY MOUTH TWICE DAILY (Patient not taking: Reported on 02/18/2021) 180 tablet 3  ? morphine (MS CONTIN) 60 MG 12 hr tablet 60 mg every 12 (twelve) hours.    ? nitroGLYCERIN (NITROSTAT) 0.4 MG SL tablet Place 1 tablet (0.4 mg total) under the tongue every 5 (five) minutes as needed for chest pain. X 3 doses (Patient not taking: Reported on 02/18/2021) 25 tablet 11  ? omeprazole (PRILOSEC) 20 MG capsule Take 1 capsule (20 mg total) by mouth daily. 30 capsule 2  ? ondansetron  (ZOFRAN) 8 MG tablet Take 1 tablet (8 mg total) by mouth every 8 (eight) hours as needed for nausea or vomiting. Starting 3 days after chemotherapy (Patient not taking: Reported on 03/10/2021) 30 tablet 2  ? potassium chloride (KLOR-CON) 10 MEQ tablet Take 1 tablet (10 mEq total) by mouth 2 (two) times daily. 10 tablet 0  ? predniSONE (DELTASONE) 20 MG tablet Please take 5 tablets (100 mg) daily for 7 days, then take 4 tablets (80 mg) daily for 7 days, then take 3 (60 mg) daily for 7 days, then take 2 (40 mg) daily for 7 days, then 1 tablet daily (  20 mg) for 7 days, then 1/2 a tablet (10 mg) daily for 7 days. (Patient taking differently: Take 10-100 mg by mouth See admin instructions. Please take 5 tablets (100 mg) daily for 7 days, then take 4 tablets (80 mg) daily for 7 days, then take 3 (60 mg) daily for 7 days, then take 2 (40 mg) daily for 7 days, then 1 tablet daily (20 mg) for 7 days, then 1/2 a tablet (10 mg) daily for 7 days.) 109 tablet 0  ? prochlorperazine (COMPAZINE) 10 MG tablet Take 10 mg by mouth every 6 (six) hours as needed for nausea or vomiting.    ? rosuvastatin (CRESTOR) 20 MG tablet TAKE 1 TABLET(20 MG) BY MOUTH DAILY (Patient taking differently: Take 20 mg by mouth daily.) 90 tablet 2  ? triamcinolone cream (KENALOG) 0.1 % Apply 1 application topically 2 (two) times daily. 453.6 g 0  ? ?No current facility-administered medications for this visit.  ? ? ?SURGICAL HISTORY:  ?Past Surgical History:  ?Procedure Laterality Date  ? APPENDECTOMY    ? BIOPSY  02/10/2021  ? Procedure: BIOPSY;  Surgeon: Gatha Mayer, MD;  Location: [facility];  Service: Endoscopy;;  ? BRONCHIAL NEEDLE ASPIRATION BIOPSY  09/18/2020  ? Procedure: BRONCHIAL NEEDLE ASPIRATION BIOPSIES;  Surgeon: Garner Nash, DO;  Location: [facility];  Service: Pulmonary;;  ? CARDIAC CATHETERIZATION  January '06  ? Questionable 70-80% mid RCA lesion; 40% LAD lesion.  ? COLONOSCOPY    ? CORONARY ANGIOPLASTY WITH STENT PLACEMENT   January '06  ? Despite negative Myoview, continued anginal pain: RCA, treated with 2.75 mm x 16 mm Taxus DES  ? CORONARY ANGIOPLASTY WITH STENT PLACEMENT  March '06  ? Recurrent unstable angina at: IVUS of

## 2021-05-20 NOTE — Progress Notes (Signed)
Patient's sister called back with questions about the grant and gift cards for food. Advised how this works and cards are given on the days and weeks he has treatment or appointments at the [facility]. ? ?Advised of the food pantry at [facility] in the support center. ? ?She has my card for any additional financial questions or concerns. ?

## 2021-05-21 DIAGNOSIS — C3431 Malignant neoplasm of lower lobe, right bronchus or lung: Secondary | ICD-10-CM | POA: Diagnosis not present

## 2021-05-21 MED ORDER — SODIUM CHLORIDE 0.9% FLUSH
10.0000 mL | INTRAVENOUS | Status: AC | PRN
  Administered 2021-05-21: 10 mL

## 2021-05-21 MED ORDER — HEPARIN SOD (PORK) LOCK FLUSH 100 UNIT/ML IV SOLN
500.0000 [IU] | INTRAVENOUS | Status: AC | PRN
  Administered 2021-05-21: 500 [IU]

## 2021-05-21 NOTE — Addendum Note (Signed)
Addended by: Azzie Almas on: 05/21/2021 11:58 AM ? ? Modules accepted: Orders ? ?

## 2021-06-07 ENCOUNTER — Ambulatory Visit (INDEPENDENT_AMBULATORY_CARE_PROVIDER_SITE_OTHER): Payer: PPO | Admitting: Pulmonary Disease

## 2021-06-07 ENCOUNTER — Encounter: Payer: Self-pay | Admitting: Pulmonary Disease

## 2021-06-07 VITALS — BP 134/76 | HR 92 | Temp 97.8°F | Ht 68.0 in | Wt 254.4 lb

## 2021-06-07 DIAGNOSIS — R918 Other nonspecific abnormal finding of lung field: Secondary | ICD-10-CM | POA: Diagnosis not present

## 2021-06-07 DIAGNOSIS — C3491 Malignant neoplasm of unspecified part of right bronchus or lung: Secondary | ICD-10-CM | POA: Diagnosis not present

## 2021-06-07 DIAGNOSIS — R0609 Other forms of dyspnea: Secondary | ICD-10-CM

## 2021-06-07 DIAGNOSIS — Z87891 Personal history of nicotine dependence: Secondary | ICD-10-CM

## 2021-06-07 DIAGNOSIS — J42 Unspecified chronic bronchitis: Secondary | ICD-10-CM | POA: Diagnosis not present

## 2021-06-07 DIAGNOSIS — J432 Centrilobular emphysema: Secondary | ICD-10-CM

## 2021-06-07 MED ORDER — BREZTRI AEROSPHERE 160-9-4.8 MCG/ACT IN AERO: 2.0000 | INHALATION_SPRAY | Freq: Two times a day (BID) | RESPIRATORY_TRACT | 0 refills | Status: DC
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
## 2021-06-07 NOTE — Patient Instructions (Signed)
Thank you for visiting Dr. Valeta Harms at [facility]. ?Today we recommend the following: ? ?Breztri inhaler samples  ? ?Return in about 3 months (around 09/06/2021) for with APP or Dr. Valeta Harms. ? ? ? ?Please do your part to reduce the spread of COVID-19.  ? ?

## 2021-06-07 NOTE — Addendum Note (Signed)
Addended by: Fran Lowes on: 06/07/2021 10:02 AM ? ? Modules accepted: Orders ? ?

## 2021-06-09 ENCOUNTER — Telehealth: Payer: Self-pay | Admitting: Medical Oncology

## 2021-06-09 NOTE — Telephone Encounter (Signed)
Sister reports  ?"Grayer's arms are breaking out again and has sores  real bad ".  ? ?Prednisone taper completed 2 weeks ago. ? ?Last Keytruda on jan 25th. ? ?I called back his sister and no answer. I lvm on [place] phone to return my call. ? ? ?

## 2021-06-11 ENCOUNTER — Telehealth: Payer: Self-pay | Admitting: Medical Oncology

## 2021-06-11 NOTE — Telephone Encounter (Signed)
Skin problem -Per Dr. Julien Nordmann I lvm on [name] [name]'s phone that Dontre needs to see dermatology  for his arm that she described as  patchy rash and breaking out in sores and to let us know if Smitty needs a referral or his PCP can refer him. ?

## 2021-06-22 DIAGNOSIS — C3490 Malignant neoplasm of unspecified part of unspecified bronchus or lung: Secondary | ICD-10-CM | POA: Diagnosis not present

## 2021-06-22 DIAGNOSIS — I2510 Atherosclerotic heart disease of native coronary artery without angina pectoris: Secondary | ICD-10-CM | POA: Diagnosis not present

## 2021-06-22 DIAGNOSIS — J449 Chronic obstructive pulmonary disease, unspecified: Secondary | ICD-10-CM | POA: Diagnosis not present

## 2021-06-22 DIAGNOSIS — R7303 Prediabetes: Secondary | ICD-10-CM | POA: Diagnosis not present

## 2021-06-22 DIAGNOSIS — R21 Rash and other nonspecific skin eruption: Secondary | ICD-10-CM | POA: Diagnosis not present

## 2021-06-22 DIAGNOSIS — I10 Essential (primary) hypertension: Secondary | ICD-10-CM | POA: Diagnosis not present

## 2021-06-25 ENCOUNTER — Ambulatory Visit (INDEPENDENT_AMBULATORY_CARE_PROVIDER_SITE_OTHER): Payer: PPO | Admitting: Physician Assistant

## 2021-06-25 ENCOUNTER — Encounter: Payer: Self-pay | Admitting: Physician Assistant

## 2021-06-25 VITALS — BP 132/78 | HR 84 | Ht 68.0 in | Wt 249.0 lb

## 2021-06-25 DIAGNOSIS — I10 Essential (primary) hypertension: Secondary | ICD-10-CM

## 2021-06-25 DIAGNOSIS — J449 Chronic obstructive pulmonary disease, unspecified: Secondary | ICD-10-CM | POA: Diagnosis not present

## 2021-06-25 DIAGNOSIS — C3491 Malignant neoplasm of unspecified part of right bronchus or lung: Secondary | ICD-10-CM | POA: Diagnosis not present

## 2021-06-25 DIAGNOSIS — R600 Localized edema: Secondary | ICD-10-CM | POA: Diagnosis not present

## 2021-06-25 DIAGNOSIS — I25119 Atherosclerotic heart disease of native coronary artery with unspecified angina pectoris: Secondary | ICD-10-CM | POA: Diagnosis not present

## 2021-06-25 DIAGNOSIS — E785 Hyperlipidemia, unspecified: Secondary | ICD-10-CM

## 2021-06-25 MED ORDER — NITROGLYCERIN 0.4 MG SL SUBL: 0.4000 mg | SUBLINGUAL_TABLET | SUBLINGUAL | 3 refills | Status: AC | PRN

## 2021-06-25 MED ORDER — ROSUVASTATIN CALCIUM 20 MG PO TABS: 20.0000 mg | ORAL_TABLET | Freq: Every day | ORAL | 3 refills | Status: AC

## 2021-06-25 MED ORDER — TICAGRELOR 60 MG PO TABS: 60.0000 mg | ORAL_TABLET | Freq: Two times a day (BID) | ORAL | 3 refills | Status: DC

## 2021-06-25 NOTE — Patient Instructions (Signed)
Medication Instructions:  ?NO CHANGES ? ?*If you need a refill on your cardiac medications before your next appointment, please call your pharmacy* ? ?  ?Follow-Up: ?At [facility] you and your health needs are our priority.  As part of our continuing mission to provide you with exceptional heart care, we have created designated Provider Care Teams.  These Care Teams include your primary Cardiologist (physician) and Advanced Practice Providers (APPs -  Physician Assistants and Nurse Practitioners) who all work together to provide you with the care you need, when you need it. ? ?We recommend signing up for the patient portal called "MyChart".  Sign up information is provided on this After Visit Summary.  MyChart is used to connect with patients for Virtual Visits (Telemedicine).  Patients are able to view lab/test results, encounter notes, upcoming appointments, etc.  Non-urgent messages can be sent to your provider as well.   ?To learn more about what you can do with MyChart, go to [email or web address].   ? ?Your next appointment:   ?6 month(s) ? ?The format for your next appointment:   ?In Person ? ?Provider:   ?Glenetta Hew, MD { ? ? ? ? ? ?
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
## 2021-06-27 ENCOUNTER — Encounter: Payer: Self-pay | Admitting: Physician Assistant

## 2021-07-01 ENCOUNTER — Other Ambulatory Visit: Payer: Self-pay | Admitting: Cardiology

## 2021-07-16 ENCOUNTER — Encounter: Payer: Self-pay | Admitting: Physician Assistant

## 2021-07-16 ENCOUNTER — Other Ambulatory Visit: Payer: Self-pay | Admitting: Medical Oncology

## 2021-07-16 ENCOUNTER — Encounter: Payer: Self-pay | Admitting: Internal Medicine

## 2021-07-16 DIAGNOSIS — T50995A Adverse effect of other drugs, medicaments and biological substances, initial encounter: Secondary | ICD-10-CM

## 2021-07-16 MED ORDER — PREDNISONE 50 MG PO TABS: ORAL_TABLET | ORAL | 5 refills | Status: DC

## 2021-07-16 MED ORDER — DIPHENHYDRAMINE HCL 50 MG PO TABS: 50.0000 mg | ORAL_TABLET | ORAL | 0 refills | Status: DC

## 2021-07-19 ENCOUNTER — Inpatient Hospital Stay: Payer: PPO

## 2021-07-19 ENCOUNTER — Ambulatory Visit (HOSPITAL_COMMUNITY)
Admission: RE | Admit: 2021-07-19 | Discharge: 2021-07-19 | Disposition: A | Discharge status: Home / Self Care | Payer: PPO | Source: Ambulatory Visit | Attending: Internal Medicine | Admitting: Internal Medicine

## 2021-07-19 ENCOUNTER — Other Ambulatory Visit: Payer: Self-pay

## 2021-07-19 ENCOUNTER — Other Ambulatory Visit: Payer: Self-pay | Admitting: Internal Medicine

## 2021-07-19 ENCOUNTER — Inpatient Hospital Stay: Payer: PPO | Attending: Physician Assistant

## 2021-07-19 DIAGNOSIS — C3490 Malignant neoplasm of unspecified part of unspecified bronchus or lung: Secondary | ICD-10-CM

## 2021-07-19 DIAGNOSIS — Z79899 Other long term (current) drug therapy: Secondary | ICD-10-CM | POA: Insufficient documentation

## 2021-07-19 DIAGNOSIS — J929 Pleural plaque without asbestos: Secondary | ICD-10-CM | POA: Diagnosis not present

## 2021-07-19 DIAGNOSIS — Z79811 Long term (current) use of aromatase inhibitors: Secondary | ICD-10-CM | POA: Diagnosis not present

## 2021-07-19 DIAGNOSIS — C3431 Malignant neoplasm of lower lobe, right bronchus or lung: Secondary | ICD-10-CM | POA: Diagnosis not present

## 2021-07-19 DIAGNOSIS — L237 Allergic contact dermatitis due to plants, except food: Secondary | ICD-10-CM | POA: Insufficient documentation

## 2021-07-19 DIAGNOSIS — C7972 Secondary malignant neoplasm of left adrenal gland: Secondary | ICD-10-CM | POA: Insufficient documentation

## 2021-07-19 DIAGNOSIS — N281 Cyst of kidney, acquired: Secondary | ICD-10-CM | POA: Diagnosis not present

## 2021-07-19 DIAGNOSIS — C3491 Malignant neoplasm of unspecified part of right bronchus or lung: Secondary | ICD-10-CM

## 2021-07-19 DIAGNOSIS — C7951 Secondary malignant neoplasm of bone: Secondary | ICD-10-CM | POA: Diagnosis not present

## 2021-07-19 DIAGNOSIS — Z9221 Personal history of antineoplastic chemotherapy: Secondary | ICD-10-CM | POA: Insufficient documentation

## 2021-07-19 LAB — CBC WITH DIFFERENTIAL (CANCER CENTER ONLY)
Abs Immature Granulocytes: 0.02 10*3/uL (ref 0.00–0.07)
Basophils Absolute: 0 10*3/uL (ref 0.0–0.1)
Basophils Relative: 0 %
Eosinophils Absolute: 0.5 10*3/uL (ref 0.0–0.5)
Eosinophils Relative: 8 %
HCT: 37.2 % — ABNORMAL LOW (ref 39.0–52.0)
Hemoglobin: 12.6 g/dL — ABNORMAL LOW (ref 13.0–17.0)
Immature Granulocytes: 0 %
Lymphocytes Relative: 21 %
Lymphs Abs: 1.3 10*3/uL (ref 0.7–4.0)
MCH: 32.1 pg (ref 26.0–34.0)
MCHC: 33.9 g/dL (ref 30.0–36.0)
MCV: 94.7 fL (ref 80.0–100.0)
Monocytes Absolute: 0.6 10*3/uL (ref 0.1–1.0)
Monocytes Relative: 10 %
Neutro Abs: 3.8 10*3/uL (ref 1.7–7.7)
Neutrophils Relative %: 61 %
Platelet Count: 183 10*3/uL (ref 150–400)
RBC: 3.93 MIL/uL — ABNORMAL LOW (ref 4.22–5.81)
RDW: 12.5 % (ref 11.5–15.5)
WBC Count: 6.1 10*3/uL (ref 4.0–10.5)
nRBC: 0 % (ref 0.0–0.2)

## 2021-07-19 LAB — CMP (CANCER CENTER ONLY)
ALT: 13 U/L (ref 0–44)
AST: 17 U/L (ref 15–41)
Albumin: 4.3 g/dL (ref 3.5–5.0)
Alkaline Phosphatase: 49 U/L (ref 38–126)
Anion gap: 5 (ref 5–15)
BUN: 17 mg/dL (ref 8–23)
CO2: 33 mmol/L — ABNORMAL HIGH (ref 22–32)
Calcium: 9.5 mg/dL (ref 8.9–10.3)
Chloride: 99 mmol/L (ref 98–111)
Creatinine: 1.38 mg/dL — ABNORMAL HIGH (ref 0.61–1.24)
GFR, Estimated: 57 mL/min — ABNORMAL LOW (ref >60)
Glucose, Bld: 116 mg/dL — ABNORMAL HIGH (ref 70–99)
Potassium: 4.2 mmol/L (ref 3.5–5.1)
Sodium: 137 mmol/L (ref 135–145)
Total Bilirubin: 0.8 mg/dL (ref 0.3–1.2)
Total Protein: 7.2 g/dL (ref 6.5–8.1)

## 2021-07-19 LAB — TSH: TSH: 2.329 u[IU]/mL (ref 0.350–4.500)

## 2021-07-19 IMAGING — CT CT CHEST-ABD-PELV W/O CM
3 of 4 series · 10 of 36 positions shown, 15 images · non-contrast
Comparison: Most recent CT chest, abdomen and pelvis [DATE].
[DATE] PET-CT.

CLINICAL DATA: Primary Cancer Type: Lung
TECHNIQUE: Multidetector CT imaging of the chest, abdomen and pelvis was
performed following the standard protocol without IV contrast.

RADIATION DOSE REDUCTION: This exam was performed according to the
departmental dose-optimization program which includes automated
exposure control, adjustment of the mA and/or kV according to
patient size and/or use of iterative reconstruction technique.

[Series 2: cap w/o · axial · non-contrast · 0.97mm/px · z∈[-588,-113]mm · 6 of 134 slices shown, 11 images]
[im 20/134  mediastinal]
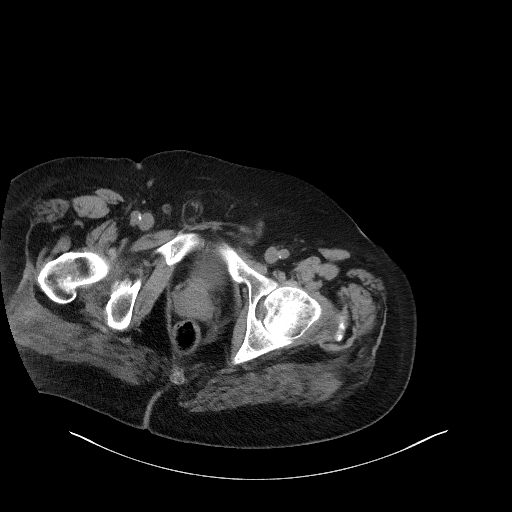
[im 20/134  bone]
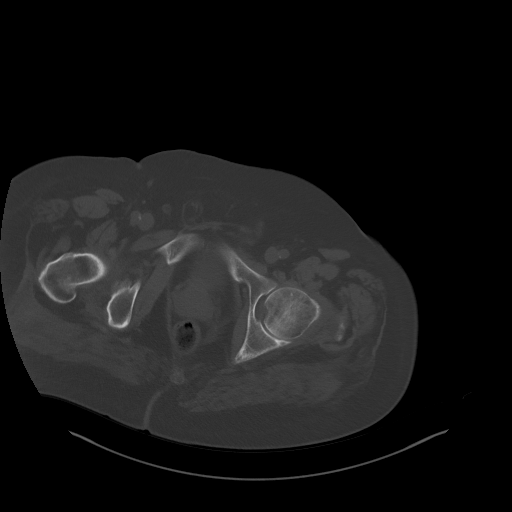
[im 39/134  mediastinal]
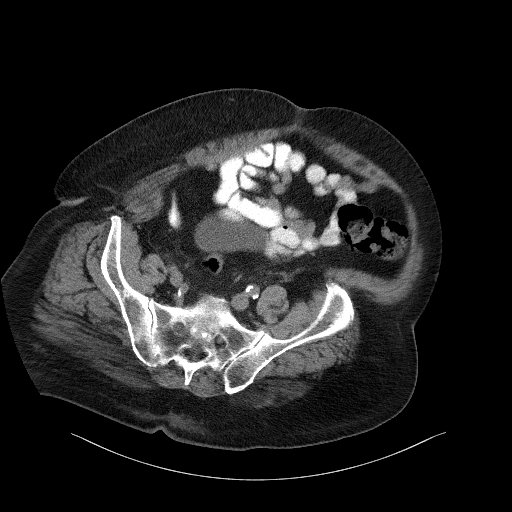
[im 58/134  mediastinal]
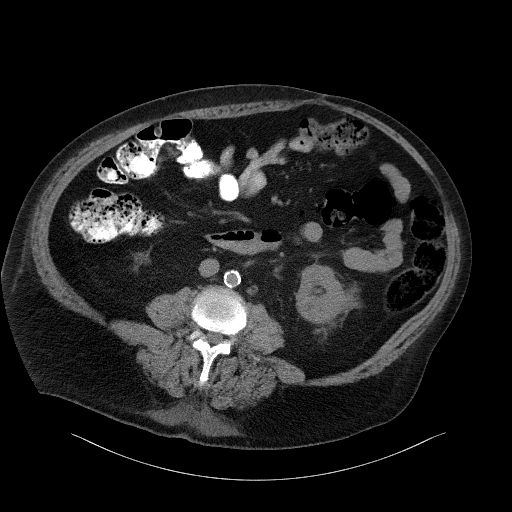
[im 58/134  lung]
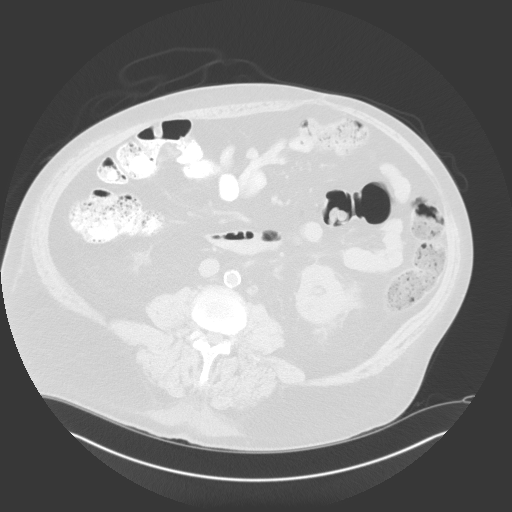
[im 77/134  mediastinal]
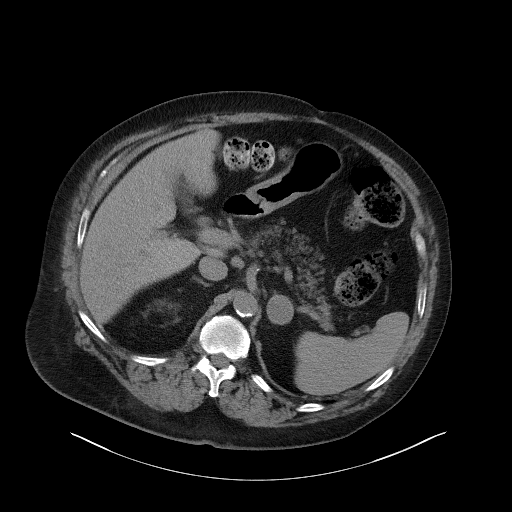
[im 77/134  lung]
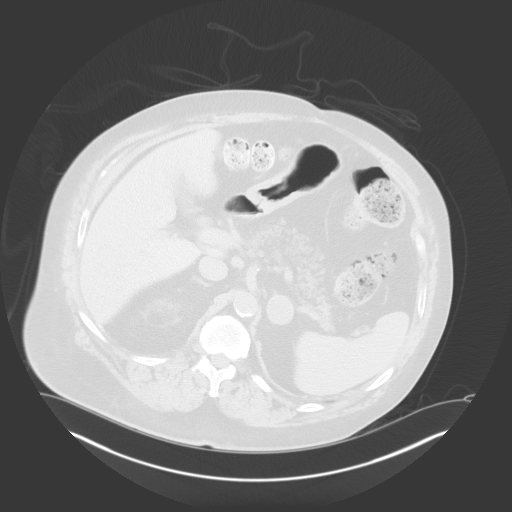
[im 96/134  mediastinal]
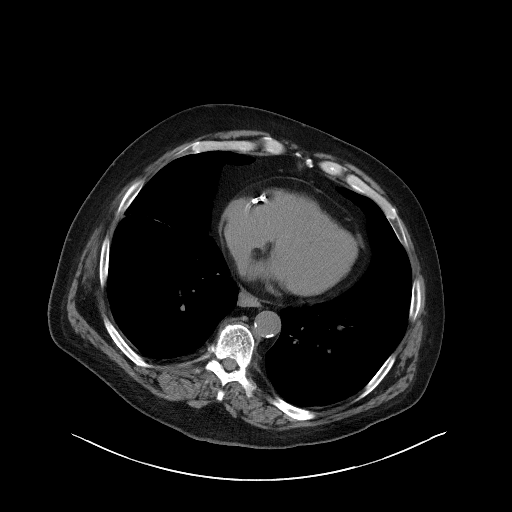
[im 96/134  lung]
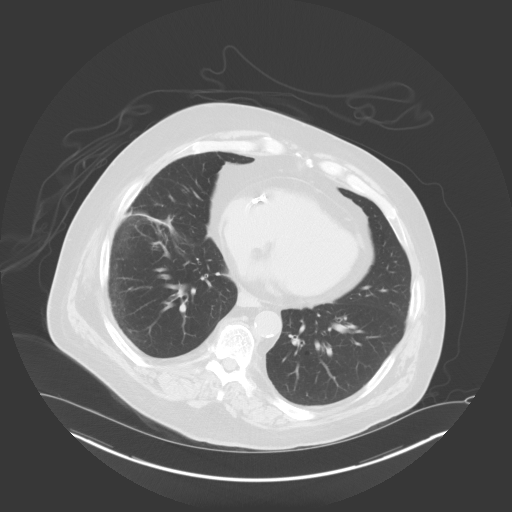
[im 115/134  mediastinal]
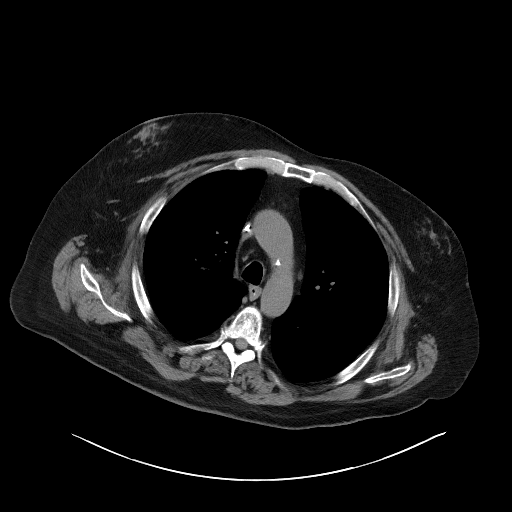
[im 115/134  lung]
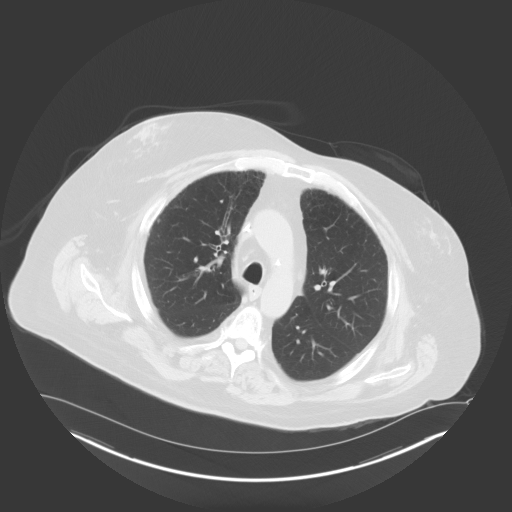

[Series 4: coronals · coronal · 0.93mm/px · 3 of 182 slices shown]
[im 37/182  mediastinal]
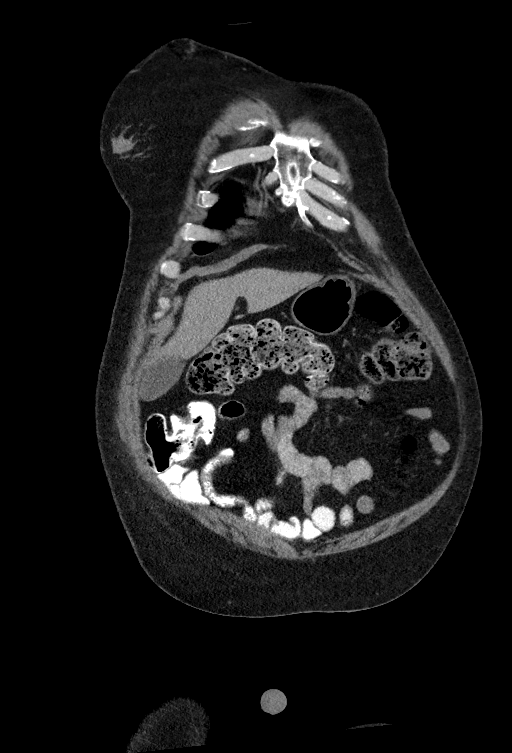
[im 73/182  mediastinal]
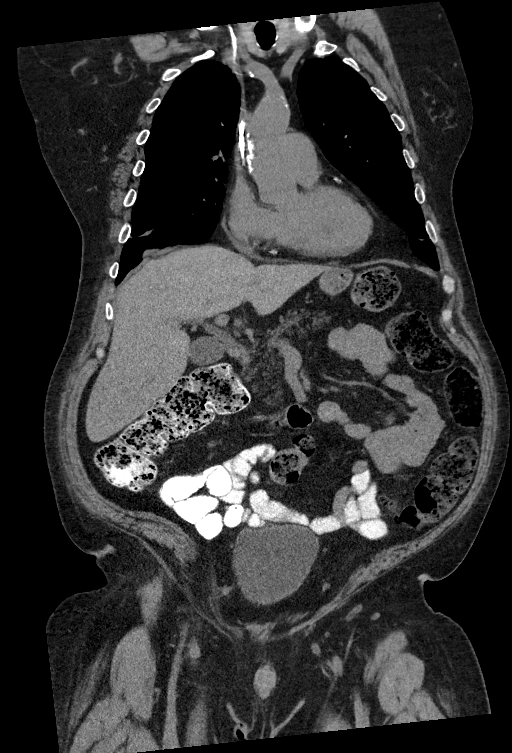
[im 109/182  mediastinal]
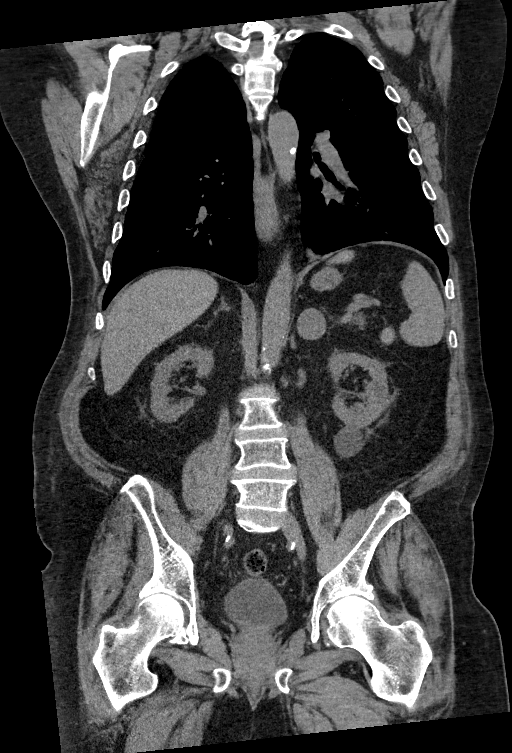

[Series 5: sagittals · sagittal · 0.71mm/px · 1 of 240 slices shown]
[im 75/240  mediastinal]
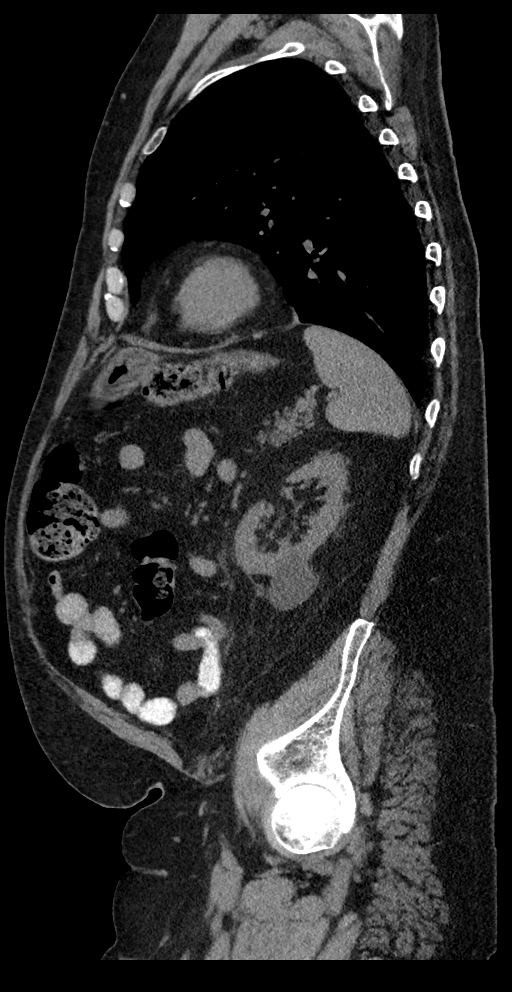

[10 of 36 positions shown; findings below may reference images not displayed]

Imaging Indication: Routine surveillance

Interval therapy since last imaging? No

Initial Cancer Diagnosis

Date: [DATE]; Established by: Biopsy-proven

Detailed Pathology: Stage IV non-small cell lung cancer, unable to
differentiate between squamous cell carcinoma and adenocarcinoma.

Primary Tumor location: Right lower lobe. Left adrenal gland
metastasis.

Surgeries: ERCP with biliary sphincterotomy [DATE]. Laparoscopic
repair of incarcerated incisional hernia with mesh [DATE].
Cardiac stent. Appendectomy.

Chemotherapy: Yes; Ongoing? No; Most recent administration:
[DATE]

Immunotherapy?  Yes; Type: Keytruda; Ongoing? No

Radiation therapy? No

EXAM:
CT CHEST, ABDOMEN AND PELVIS WITHOUT CONTRAST
FINDINGS: CT CHEST FINDINGS

Cardiovascular: Port in the anterior chest wall with tip in distal
SVC. Coronary stent noted.

Mediastinum/Nodes: No axillary or supraclavicular adenopathy.

Subcarinal node measures 12 mm compared to 16 mm. No new mediastinal
adenopathy.

Lungs/Pleura: Rounded nodule along the RIGHT oblique fissure
measuring 5 mm (image 72/series 6) is unchanged.

Pleuroparenchymal thickening along the inferior margin of the RIGHT
oblique fissure decreased in volume measuring 2.7 x 0.7 cm compared
to 2.9 x 1.1 cm. Site of prior lung cancer demonstrated on PET-CT
scan.

No new pulmonary nodules are present.

Musculoskeletal: No aggressive osseous lesion.

CT ABDOMEN AND PELVIS FINDINGS

Hepatobiliary: No focal hepatic lesion on noncontrast exam. Benign
pneumobilia in the LEFT hepatic lobe.

Pancreas: Pancreas is normal. No ductal dilatation. No pancreatic
inflammation.

Spleen: Normal spleen

Adrenals/urinary tract: Interval decrease in size of LEFT adrenal
metastasis measuring 2.9 x 2.6 cm compared with 3.2 x 3.1 cm. RIGHT
adrenal gland normal. Benign cyst lower pole of the LEFT kidney. No
follow-up recommended. Ureters and bladder normal.

Stomach/Bowel: Stomach, small bowel, appendix, and cecum are normal.
The colon and rectosigmoid colon are normal.

Vascular/Lymphatic: Abdominal aorta is normal caliber with
atherosclerotic calcification. There is no retroperitoneal or
periportal lymphadenopathy. No pelvic lymphadenopathy.

Reproductive: Prostate normal

Other: No free fluid.

Musculoskeletal: No aggressive osseous lesion. Small sclerotic
lesion in the LEFT iliac wing is unchanged from prior
IMPRESSION: Chest Impression:

Number

1. Decrease in angular pleuroparenchymal thickening along the
inferior RIGHT oblique fissure. Site of prior lung cancer.
2. Interval decrease in size subcarinal lymph.
3. Stable small RIGHT lobe pulmonary nodule.
4. No progressive ro  recurrent lung cancer.

Abdomen / Pelvis Impression:

1. Interval decrease in size of LEFT adrenal gland metastasis.
2. No progressive metastatic disease in the abdomen pelvis.
3. No skeletal metastasis.

## 2021-07-19 MED ORDER — HEPARIN SOD (PORK) LOCK FLUSH 100 UNIT/ML IV SOLN
500.0000 [IU] | Freq: Once | INTRAVENOUS | Status: AC
  Administered 2021-07-19: 500 [IU] via INTRAVENOUS

## 2021-07-19 MED ORDER — SODIUM CHLORIDE 0.9% FLUSH
10.0000 mL | INTRAVENOUS | Status: AC
  Administered 2021-07-19: 10 mL via INTRAVENOUS

## 2021-07-20 ENCOUNTER — Telehealth: Payer: Self-pay | Admitting: Internal Medicine

## 2021-07-20 NOTE — Telephone Encounter (Signed)
Called patient regarding upcoming appointment, left a voicemail. 

## 2021-07-21 ENCOUNTER — Inpatient Hospital Stay (HOSPITAL_BASED_OUTPATIENT_CLINIC_OR_DEPARTMENT_OTHER): Payer: PPO | Admitting: Internal Medicine

## 2021-07-21 ENCOUNTER — Other Ambulatory Visit: Payer: Self-pay

## 2021-07-21 VITALS — BP 112/71 | HR 75 | Temp 96.5°F | Resp 16 | Wt 253.0 lb

## 2021-07-21 DIAGNOSIS — C3431 Malignant neoplasm of lower lobe, right bronchus or lung: Secondary | ICD-10-CM | POA: Diagnosis not present

## 2021-07-21 DIAGNOSIS — C3491 Malignant neoplasm of unspecified part of right bronchus or lung: Secondary | ICD-10-CM

## 2021-07-21 DIAGNOSIS — C3490 Malignant neoplasm of unspecified part of unspecified bronchus or lung: Secondary | ICD-10-CM

## 2021-07-21 DIAGNOSIS — Z79899 Other long term (current) drug therapy: Secondary | ICD-10-CM | POA: Diagnosis not present

## 2021-07-21 DIAGNOSIS — Z9221 Personal history of antineoplastic chemotherapy: Secondary | ICD-10-CM | POA: Diagnosis not present

## 2021-07-21 DIAGNOSIS — L237 Allergic contact dermatitis due to plants, except food: Secondary | ICD-10-CM

## 2021-07-21 DIAGNOSIS — C7972 Secondary malignant neoplasm of left adrenal gland: Secondary | ICD-10-CM | POA: Diagnosis not present

## 2021-07-21 MED ORDER — METHYLPREDNISOLONE 4 MG PO TBPK: ORAL_TABLET | ORAL | 0 refills | Status: DC
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
## 2021-07-27 ENCOUNTER — Telehealth: Payer: Self-pay | Admitting: Pulmonary Disease

## 2021-07-27 DIAGNOSIS — M150 Primary generalized (osteo)arthritis: Secondary | ICD-10-CM | POA: Diagnosis not present

## 2021-07-27 DIAGNOSIS — Z79891 Long term (current) use of opiate analgesic: Secondary | ICD-10-CM | POA: Diagnosis not present

## 2021-07-27 DIAGNOSIS — G893 Neoplasm related pain (acute) (chronic): Secondary | ICD-10-CM | POA: Diagnosis not present

## 2021-07-27 DIAGNOSIS — G894 Chronic pain syndrome: Secondary | ICD-10-CM | POA: Diagnosis not present

## 2021-07-27 NOTE — Telephone Encounter (Signed)
Called [name] [name] but she did not answer. Left message for her to call us back.

## 2021-07-29 MED ORDER — BREZTRI AEROSPHERE 160-9-4.8 MCG/ACT IN AERO: 2.0000 | INHALATION_SPRAY | Freq: Two times a day (BID) | RESPIRATORY_TRACT | 3 refills | Status: AC

## 2021-07-29 NOTE — Telephone Encounter (Signed)
Spoke with [name] [name]  She states pt's breathing has improved on breztri and he requests rx  I sent 90 day supply with 3 rf per pt request  Nothing further needed

## 2021-08-19 DIAGNOSIS — L299 Pruritus, unspecified: Secondary | ICD-10-CM | POA: Diagnosis not present

## 2021-08-19 DIAGNOSIS — R21 Rash and other nonspecific skin eruption: Secondary | ICD-10-CM | POA: Diagnosis not present

## 2021-08-26 ENCOUNTER — Telehealth: Payer: Self-pay | Admitting: Internal Medicine

## 2021-08-26 NOTE — Telephone Encounter (Signed)
Called patient regarding upcoming August appointment, patient has been called and voicemail was left.

## 2021-09-04 ENCOUNTER — Other Ambulatory Visit: Payer: Self-pay | Admitting: Cardiology

## 2021-09-08 DIAGNOSIS — L562 Photocontact dermatitis [berloque dermatitis]: Secondary | ICD-10-CM | POA: Diagnosis not present

## 2021-09-27 ENCOUNTER — Other Ambulatory Visit: Payer: Self-pay

## 2021-09-27 ENCOUNTER — Telehealth: Payer: Self-pay | Admitting: Internal Medicine

## 2021-09-27 NOTE — Telephone Encounter (Signed)
Called patient regarding upcoming rescheduled August appointments, patient has been called and voicemail was left.

## 2021-09-29 ENCOUNTER — Other Ambulatory Visit: Payer: Self-pay

## 2021-10-13 ENCOUNTER — Other Ambulatory Visit: Payer: Self-pay

## 2021-10-19 ENCOUNTER — Ambulatory Visit (HOSPITAL_COMMUNITY)
Admission: RE | Admit: 2021-10-19 | Discharge: 2021-10-19 | Disposition: A | Discharge status: Home / Self Care | Payer: PPO | Source: Ambulatory Visit | Attending: Internal Medicine | Admitting: Internal Medicine

## 2021-10-19 ENCOUNTER — Ambulatory Visit (HOSPITAL_COMMUNITY): Payer: PPO

## 2021-10-19 ENCOUNTER — Inpatient Hospital Stay: Payer: PPO | Attending: Physician Assistant

## 2021-10-19 ENCOUNTER — Other Ambulatory Visit: Payer: Self-pay

## 2021-10-19 ENCOUNTER — Inpatient Hospital Stay: Payer: PPO

## 2021-10-19 ENCOUNTER — Encounter (HOSPITAL_COMMUNITY): Payer: Self-pay

## 2021-10-19 DIAGNOSIS — T451X5A Adverse effect of antineoplastic and immunosuppressive drugs, initial encounter: Secondary | ICD-10-CM

## 2021-10-19 DIAGNOSIS — C7951 Secondary malignant neoplasm of bone: Secondary | ICD-10-CM | POA: Diagnosis not present

## 2021-10-19 DIAGNOSIS — Z79899 Other long term (current) drug therapy: Secondary | ICD-10-CM | POA: Insufficient documentation

## 2021-10-19 DIAGNOSIS — R918 Other nonspecific abnormal finding of lung field: Secondary | ICD-10-CM | POA: Diagnosis not present

## 2021-10-19 DIAGNOSIS — D702 Other drug-induced agranulocytosis: Secondary | ICD-10-CM

## 2021-10-19 DIAGNOSIS — C7972 Secondary malignant neoplasm of left adrenal gland: Secondary | ICD-10-CM | POA: Insufficient documentation

## 2021-10-19 DIAGNOSIS — C3431 Malignant neoplasm of lower lobe, right bronchus or lung: Secondary | ICD-10-CM | POA: Insufficient documentation

## 2021-10-19 DIAGNOSIS — C7971 Secondary malignant neoplasm of right adrenal gland: Secondary | ICD-10-CM | POA: Diagnosis not present

## 2021-10-19 DIAGNOSIS — C3491 Malignant neoplasm of unspecified part of right bronchus or lung: Secondary | ICD-10-CM

## 2021-10-19 DIAGNOSIS — C3490 Malignant neoplasm of unspecified part of unspecified bronchus or lung: Secondary | ICD-10-CM | POA: Insufficient documentation

## 2021-10-19 LAB — CBC WITH DIFFERENTIAL (CANCER CENTER ONLY)
Abs Immature Granulocytes: 0.03 10*3/uL (ref 0.00–0.07)
Basophils Absolute: 0 10*3/uL (ref 0.0–0.1)
Basophils Relative: 0 %
Eosinophils Absolute: 0.3 10*3/uL (ref 0.0–0.5)
Eosinophils Relative: 5 %
HCT: 42 % (ref 39.0–52.0)
Hemoglobin: 14.2 g/dL (ref 13.0–17.0)
Immature Granulocytes: 1 %
Lymphocytes Relative: 21 %
Lymphs Abs: 1.4 10*3/uL (ref 0.7–4.0)
MCH: 31.5 pg (ref 26.0–34.0)
MCHC: 33.8 g/dL (ref 30.0–36.0)
MCV: 93.1 fL (ref 80.0–100.0)
Monocytes Absolute: 0.6 10*3/uL (ref 0.1–1.0)
Monocytes Relative: 10 %
Neutro Abs: 4.3 10*3/uL (ref 1.7–7.7)
Neutrophils Relative %: 63 %
Platelet Count: 164 10*3/uL (ref 150–400)
RBC: 4.51 MIL/uL (ref 4.22–5.81)
RDW: 13.2 % (ref 11.5–15.5)
WBC Count: 6.6 10*3/uL (ref 4.0–10.5)
nRBC: 0 % (ref 0.0–0.2)

## 2021-10-19 LAB — CMP (CANCER CENTER ONLY)
ALT: 36 U/L (ref 0–44)
AST: 22 U/L (ref 15–41)
Albumin: 4.3 g/dL (ref 3.5–5.0)
Alkaline Phosphatase: 58 U/L (ref 38–126)
Anion gap: 6 (ref 5–15)
BUN: 16 mg/dL (ref 8–23)
CO2: 29 mmol/L (ref 22–32)
Calcium: 9.5 mg/dL (ref 8.9–10.3)
Chloride: 103 mmol/L (ref 98–111)
Creatinine: 1.2 mg/dL (ref 0.61–1.24)
GFR, Estimated: >60 mL/min (ref >60)
Glucose, Bld: 110 mg/dL — ABNORMAL HIGH (ref 70–99)
Potassium: 4.3 mmol/L (ref 3.5–5.1)
Sodium: 138 mmol/L (ref 135–145)
Total Bilirubin: 0.8 mg/dL (ref 0.3–1.2)
Total Protein: 7.4 g/dL (ref 6.5–8.1)

## 2021-10-19 MED ORDER — SODIUM CHLORIDE 0.9% FLUSH
10.0000 mL | Freq: Once | INTRAVENOUS | Status: AC
  Administered 2021-10-19: 10 mL

## 2021-10-19 MED ORDER — HEPARIN SOD (PORK) LOCK FLUSH 100 UNIT/ML IV SOLN
500.0000 [IU] | Freq: Once | INTRAVENOUS | Status: AC
  Administered 2021-10-19: 500 [IU]

## 2021-10-21 ENCOUNTER — Ambulatory Visit: Payer: PPO | Admitting: Internal Medicine

## 2021-10-21 ENCOUNTER — Inpatient Hospital Stay (HOSPITAL_BASED_OUTPATIENT_CLINIC_OR_DEPARTMENT_OTHER): Payer: PPO | Admitting: Internal Medicine

## 2021-10-21 ENCOUNTER — Other Ambulatory Visit: Payer: Self-pay

## 2021-10-21 ENCOUNTER — Encounter: Payer: Self-pay | Admitting: Internal Medicine

## 2021-10-21 VITALS — BP 119/69 | HR 85 | Temp 98.2°F | Resp 18 | Wt 276.6 lb

## 2021-10-21 DIAGNOSIS — M150 Primary generalized (osteo)arthritis: Secondary | ICD-10-CM | POA: Diagnosis not present

## 2021-10-21 DIAGNOSIS — C7972 Secondary malignant neoplasm of left adrenal gland: Secondary | ICD-10-CM | POA: Diagnosis not present

## 2021-10-21 DIAGNOSIS — G894 Chronic pain syndrome: Secondary | ICD-10-CM | POA: Diagnosis not present

## 2021-10-21 DIAGNOSIS — Z79891 Long term (current) use of opiate analgesic: Secondary | ICD-10-CM | POA: Diagnosis not present

## 2021-10-21 DIAGNOSIS — G893 Neoplasm related pain (acute) (chronic): Secondary | ICD-10-CM | POA: Diagnosis not present

## 2021-10-21 DIAGNOSIS — C3490 Malignant neoplasm of unspecified part of unspecified bronchus or lung: Secondary | ICD-10-CM | POA: Diagnosis not present

## 2021-10-21 DIAGNOSIS — Z79899 Other long term (current) drug therapy: Secondary | ICD-10-CM | POA: Diagnosis not present

## 2021-10-21 DIAGNOSIS — C3431 Malignant neoplasm of lower lobe, right bronchus or lung: Secondary | ICD-10-CM | POA: Diagnosis not present
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
## 2021-10-22 ENCOUNTER — Telehealth: Payer: Self-pay | Admitting: Internal Medicine

## 2021-10-22 NOTE — Telephone Encounter (Signed)
Per 8/17 los called and left message for pt about appointment

## 2021-10-23 ENCOUNTER — Other Ambulatory Visit: Payer: Self-pay

## 2021-10-26 ENCOUNTER — Encounter: Payer: Self-pay | Admitting: Physician Assistant

## 2021-10-26 ENCOUNTER — Encounter: Payer: Self-pay | Admitting: Internal Medicine

## 2021-11-04 ENCOUNTER — Ambulatory Visit (INDEPENDENT_AMBULATORY_CARE_PROVIDER_SITE_OTHER): Payer: PPO | Admitting: Pulmonary Disease

## 2021-11-04 ENCOUNTER — Encounter: Payer: Self-pay | Admitting: Pulmonary Disease

## 2021-11-04 VITALS — BP 130/70 | HR 73 | Ht 69.0 in | Wt 280.8 lb

## 2021-11-04 DIAGNOSIS — C3491 Malignant neoplasm of unspecified part of right bronchus or lung: Secondary | ICD-10-CM | POA: Diagnosis not present

## 2021-11-04 DIAGNOSIS — R918 Other nonspecific abnormal finding of lung field: Secondary | ICD-10-CM | POA: Diagnosis not present

## 2021-11-04 DIAGNOSIS — J432 Centrilobular emphysema: Secondary | ICD-10-CM

## 2021-11-04 DIAGNOSIS — Z87891 Personal history of nicotine dependence: Secondary | ICD-10-CM

## 2021-11-04 DIAGNOSIS — J42 Unspecified chronic bronchitis: Secondary | ICD-10-CM

## 2021-11-04 DIAGNOSIS — R0609 Other forms of dyspnea: Secondary | ICD-10-CM | POA: Diagnosis not present

## 2021-11-04 NOTE — Addendum Note (Signed)
Addended by: Alvin Critchley on: 11/04/2021 05:36 PM   Modules accepted: Orders
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
## 2021-11-04 NOTE — Patient Instructions (Signed)
Thank you for visiting Dr. Valeta Harms at [facility]. Today we recommend the following:  Continue breztri inhaler  Use albuterol nebs as needed  New DME supply orders for albuterol nebs and machine   Return in about 1 year (around 11/05/2022) for with Eric Form, NP, or Dr. Valeta Harms.    Please do your part to reduce the spread of COVID-19.

## 2021-11-18 DIAGNOSIS — Z23 Encounter for immunization: Secondary | ICD-10-CM | POA: Diagnosis not present

## 2021-11-18 DIAGNOSIS — R7303 Prediabetes: Secondary | ICD-10-CM | POA: Diagnosis not present

## 2021-11-18 DIAGNOSIS — I2510 Atherosclerotic heart disease of native coronary artery without angina pectoris: Secondary | ICD-10-CM | POA: Diagnosis not present

## 2021-11-18 DIAGNOSIS — I10 Essential (primary) hypertension: Secondary | ICD-10-CM | POA: Diagnosis not present

## 2021-11-18 DIAGNOSIS — C3490 Malignant neoplasm of unspecified part of unspecified bronchus or lung: Secondary | ICD-10-CM | POA: Diagnosis not present

## 2021-11-18 DIAGNOSIS — R600 Localized edema: Secondary | ICD-10-CM | POA: Diagnosis not present

## 2021-11-18 DIAGNOSIS — J449 Chronic obstructive pulmonary disease, unspecified: Secondary | ICD-10-CM | POA: Diagnosis not present

## 2021-11-22 ENCOUNTER — Telehealth: Payer: Self-pay | Admitting: Pulmonary Disease

## 2021-11-22 DIAGNOSIS — J432 Centrilobular emphysema: Secondary | ICD-10-CM | POA: Diagnosis not present

## 2021-11-22 DIAGNOSIS — J42 Unspecified chronic bronchitis: Secondary | ICD-10-CM | POA: Diagnosis not present

## 2021-11-22 MED ORDER — ALBUTEROL SULFATE (2.5 MG/3ML) 0.083% IN NEBU: 2.5000 mg | INHALATION_SOLUTION | Freq: Four times a day (QID) | RESPIRATORY_TRACT | 12 refills | Status: DC | PRN

## 2021-11-22 NOTE — Telephone Encounter (Signed)
Albuterol nebulizer solution called into [facility]. Nothing further needed

## 2021-11-22 NOTE — Telephone Encounter (Signed)
Patients sister called and states nebulizer solution was never called in to [facility] on [address]. Patient also never received nebulizer machine, gave sister Adapts number to contact regarding the order.  Please advise on nebulizer solution.

## 2021-11-23 DIAGNOSIS — J432 Centrilobular emphysema: Secondary | ICD-10-CM | POA: Diagnosis not present

## 2021-11-23 DIAGNOSIS — J449 Chronic obstructive pulmonary disease, unspecified: Secondary | ICD-10-CM | POA: Diagnosis not present

## 2021-11-23 DIAGNOSIS — J42 Unspecified chronic bronchitis: Secondary | ICD-10-CM | POA: Diagnosis not present

## 2021-11-25 ENCOUNTER — Telehealth: Payer: Self-pay | Admitting: Pulmonary Disease

## 2021-11-25 MED ORDER — ALBUTEROL SULFATE (2.5 MG/3ML) 0.083% IN NEBU: 2.5000 mg | INHALATION_SOLUTION | Freq: Four times a day (QID) | RESPIRATORY_TRACT | 12 refills | Status: AC | PRN

## 2021-11-25 NOTE — Telephone Encounter (Signed)
Called [name] but she did not answer and I could not leave a VM. Albuterol has been sent to [facility] as requested.

## 2021-11-25 NOTE — Telephone Encounter (Signed)
Patient's sister called to inform the nurse that the patient did not receive his Albuterol for his nebulizer machine.  She stated at his last visit in August, he was told he would get the machine and medication.  The medication should be sent to [facility] on [address] and [address].  If there are any questions, please call [phone]

## 2021-12-16 DIAGNOSIS — Z79891 Long term (current) use of opiate analgesic: Secondary | ICD-10-CM | POA: Diagnosis not present

## 2021-12-16 DIAGNOSIS — G894 Chronic pain syndrome: Secondary | ICD-10-CM | POA: Diagnosis not present

## 2021-12-16 DIAGNOSIS — M150 Primary generalized (osteo)arthritis: Secondary | ICD-10-CM | POA: Diagnosis not present

## 2021-12-16 DIAGNOSIS — G893 Neoplasm related pain (acute) (chronic): Secondary | ICD-10-CM | POA: Diagnosis not present

## 2021-12-22 DIAGNOSIS — I10 Essential (primary) hypertension: Secondary | ICD-10-CM | POA: Diagnosis not present

## 2021-12-22 DIAGNOSIS — R600 Localized edema: Secondary | ICD-10-CM | POA: Diagnosis not present

## 2021-12-22 DIAGNOSIS — R7303 Prediabetes: Secondary | ICD-10-CM | POA: Diagnosis not present

## 2021-12-22 DIAGNOSIS — I2510 Atherosclerotic heart disease of native coronary artery without angina pectoris: Secondary | ICD-10-CM | POA: Diagnosis not present

## 2021-12-22 DIAGNOSIS — C3490 Malignant neoplasm of unspecified part of unspecified bronchus or lung: Secondary | ICD-10-CM | POA: Diagnosis not present

## 2021-12-22 DIAGNOSIS — J449 Chronic obstructive pulmonary disease, unspecified: Secondary | ICD-10-CM | POA: Diagnosis not present

## 2021-12-22 DIAGNOSIS — J432 Centrilobular emphysema: Secondary | ICD-10-CM | POA: Diagnosis not present

## 2021-12-22 DIAGNOSIS — J42 Unspecified chronic bronchitis: Secondary | ICD-10-CM | POA: Diagnosis not present

## 2021-12-22 DIAGNOSIS — Z23 Encounter for immunization: Secondary | ICD-10-CM | POA: Diagnosis not present

## 2021-12-22 DIAGNOSIS — C799 Secondary malignant neoplasm of unspecified site: Secondary | ICD-10-CM | POA: Diagnosis not present

## 2021-12-23 DIAGNOSIS — J42 Unspecified chronic bronchitis: Secondary | ICD-10-CM | POA: Diagnosis not present

## 2021-12-23 DIAGNOSIS — J432 Centrilobular emphysema: Secondary | ICD-10-CM | POA: Diagnosis not present

## 2021-12-30 DIAGNOSIS — I2510 Atherosclerotic heart disease of native coronary artery without angina pectoris: Secondary | ICD-10-CM | POA: Diagnosis not present

## 2021-12-30 DIAGNOSIS — C7970 Secondary malignant neoplasm of unspecified adrenal gland: Secondary | ICD-10-CM | POA: Diagnosis not present

## 2021-12-30 DIAGNOSIS — J449 Chronic obstructive pulmonary disease, unspecified: Secondary | ICD-10-CM | POA: Diagnosis not present

## 2021-12-30 DIAGNOSIS — C3431 Malignant neoplasm of lower lobe, right bronchus or lung: Secondary | ICD-10-CM | POA: Diagnosis not present

## 2022-01-10 ENCOUNTER — Other Ambulatory Visit: Payer: Self-pay

## 2022-01-10 ENCOUNTER — Inpatient Hospital Stay (HOSPITAL_COMMUNITY)
Admission: EM | Admit: 2022-01-10 | Discharge: 2022-01-22 | DRG: 853 | Disposition: A | Discharge status: Home Health | Payer: Medicare Other | Attending: Internal Medicine | Admitting: Internal Medicine

## 2022-01-10 ENCOUNTER — Inpatient Hospital Stay (HOSPITAL_COMMUNITY): Payer: Medicare Other

## 2022-01-10 ENCOUNTER — Emergency Department (HOSPITAL_COMMUNITY): Payer: Medicare Other

## 2022-01-10 ENCOUNTER — Encounter (HOSPITAL_COMMUNITY): Payer: Self-pay | Admitting: Oncology

## 2022-01-10 DIAGNOSIS — I10 Essential (primary) hypertension: Secondary | ICD-10-CM | POA: Diagnosis present

## 2022-01-10 DIAGNOSIS — Z87891 Personal history of nicotine dependence: Secondary | ICD-10-CM | POA: Diagnosis not present

## 2022-01-10 DIAGNOSIS — T80211A Bloodstream infection due to central venous catheter, initial encounter: Secondary | ICD-10-CM | POA: Diagnosis not present

## 2022-01-10 DIAGNOSIS — F418 Other specified anxiety disorders: Secondary | ICD-10-CM | POA: Diagnosis not present

## 2022-01-10 DIAGNOSIS — K810 Acute cholecystitis: Secondary | ICD-10-CM | POA: Diagnosis present

## 2022-01-10 DIAGNOSIS — I5032 Chronic diastolic (congestive) heart failure: Secondary | ICD-10-CM | POA: Diagnosis not present

## 2022-01-10 DIAGNOSIS — R7881 Bacteremia: Secondary | ICD-10-CM | POA: Diagnosis not present

## 2022-01-10 DIAGNOSIS — I25119 Atherosclerotic heart disease of native coronary artery with unspecified angina pectoris: Secondary | ICD-10-CM | POA: Diagnosis present

## 2022-01-10 DIAGNOSIS — K8010 Calculus of gallbladder with chronic cholecystitis without obstruction: Secondary | ICD-10-CM | POA: Diagnosis present

## 2022-01-10 DIAGNOSIS — K8309 Other cholangitis: Secondary | ICD-10-CM | POA: Diagnosis present

## 2022-01-10 DIAGNOSIS — I25110 Atherosclerotic heart disease of native coronary artery with unstable angina pectoris: Secondary | ICD-10-CM | POA: Diagnosis not present

## 2022-01-10 DIAGNOSIS — D696 Thrombocytopenia, unspecified: Secondary | ICD-10-CM | POA: Diagnosis not present

## 2022-01-10 DIAGNOSIS — I081 Rheumatic disorders of both mitral and tricuspid valves: Secondary | ICD-10-CM | POA: Diagnosis not present

## 2022-01-10 DIAGNOSIS — Z7902 Long term (current) use of antithrombotics/antiplatelets: Secondary | ICD-10-CM

## 2022-01-10 DIAGNOSIS — K8050 Calculus of bile duct without cholangitis or cholecystitis without obstruction: Secondary | ICD-10-CM

## 2022-01-10 DIAGNOSIS — E876 Hypokalemia: Secondary | ICD-10-CM | POA: Diagnosis present

## 2022-01-10 DIAGNOSIS — F419 Anxiety disorder, unspecified: Secondary | ICD-10-CM | POA: Diagnosis not present

## 2022-01-10 DIAGNOSIS — I2510 Atherosclerotic heart disease of native coronary artery without angina pectoris: Secondary | ICD-10-CM

## 2022-01-10 DIAGNOSIS — Z515 Encounter for palliative care: Secondary | ICD-10-CM | POA: Diagnosis not present

## 2022-01-10 DIAGNOSIS — R109 Unspecified abdominal pain: Secondary | ICD-10-CM | POA: Diagnosis not present

## 2022-01-10 DIAGNOSIS — K8051 Calculus of bile duct without cholangitis or cholecystitis with obstruction: Secondary | ICD-10-CM | POA: Diagnosis present

## 2022-01-10 DIAGNOSIS — K838 Other specified diseases of biliary tract: Secondary | ICD-10-CM | POA: Diagnosis not present

## 2022-01-10 DIAGNOSIS — B9620 Unspecified Escherichia coli [E. coli] as the cause of diseases classified elsewhere: Secondary | ICD-10-CM | POA: Diagnosis not present

## 2022-01-10 DIAGNOSIS — G9341 Metabolic encephalopathy: Secondary | ICD-10-CM | POA: Diagnosis not present

## 2022-01-10 DIAGNOSIS — R1010 Upper abdominal pain, unspecified: Secondary | ICD-10-CM | POA: Diagnosis not present

## 2022-01-10 DIAGNOSIS — I712 Thoracic aortic aneurysm, without rupture, unspecified: Secondary | ICD-10-CM | POA: Diagnosis present

## 2022-01-10 DIAGNOSIS — R1011 Right upper quadrant pain: Secondary | ICD-10-CM | POA: Diagnosis not present

## 2022-01-10 DIAGNOSIS — R0989 Other specified symptoms and signs involving the circulatory and respiratory systems: Secondary | ICD-10-CM | POA: Diagnosis not present

## 2022-01-10 DIAGNOSIS — D6959 Other secondary thrombocytopenia: Secondary | ICD-10-CM | POA: Diagnosis present

## 2022-01-10 DIAGNOSIS — I252 Old myocardial infarction: Secondary | ICD-10-CM | POA: Diagnosis not present

## 2022-01-10 DIAGNOSIS — E669 Obesity, unspecified: Secondary | ICD-10-CM | POA: Diagnosis present

## 2022-01-10 DIAGNOSIS — I110 Hypertensive heart disease with heart failure: Secondary | ICD-10-CM | POA: Diagnosis present

## 2022-01-10 DIAGNOSIS — G9340 Encephalopathy, unspecified: Secondary | ICD-10-CM | POA: Diagnosis not present

## 2022-01-10 DIAGNOSIS — Z1621 Resistance to vancomycin: Secondary | ICD-10-CM | POA: Diagnosis present

## 2022-01-10 DIAGNOSIS — G894 Chronic pain syndrome: Secondary | ICD-10-CM | POA: Diagnosis not present

## 2022-01-10 DIAGNOSIS — Z6841 Body Mass Index (BMI) 40.0 and over, adult: Secondary | ICD-10-CM | POA: Diagnosis not present

## 2022-01-10 DIAGNOSIS — K8020 Calculus of gallbladder without cholecystitis without obstruction: Secondary | ICD-10-CM | POA: Insufficient documentation

## 2022-01-10 DIAGNOSIS — A419 Sepsis, unspecified organism: Secondary | ICD-10-CM

## 2022-01-10 DIAGNOSIS — A4150 Gram-negative sepsis, unspecified: Secondary | ICD-10-CM | POA: Diagnosis not present

## 2022-01-10 DIAGNOSIS — J449 Chronic obstructive pulmonary disease, unspecified: Secondary | ICD-10-CM | POA: Diagnosis present

## 2022-01-10 DIAGNOSIS — A4151 Sepsis due to Escherichia coli [E. coli]: Principal | ICD-10-CM | POA: Diagnosis present

## 2022-01-10 DIAGNOSIS — R509 Fever, unspecified: Secondary | ICD-10-CM | POA: Diagnosis not present

## 2022-01-10 DIAGNOSIS — J432 Centrilobular emphysema: Secondary | ICD-10-CM | POA: Diagnosis not present

## 2022-01-10 DIAGNOSIS — Z1152 Encounter for screening for COVID-19: Secondary | ICD-10-CM

## 2022-01-10 DIAGNOSIS — Z7901 Long term (current) use of anticoagulants: Secondary | ICD-10-CM

## 2022-01-10 DIAGNOSIS — C3491 Malignant neoplasm of unspecified part of right bronchus or lung: Secondary | ICD-10-CM | POA: Diagnosis present

## 2022-01-10 DIAGNOSIS — C7972 Secondary malignant neoplasm of left adrenal gland: Secondary | ICD-10-CM | POA: Diagnosis not present

## 2022-01-10 DIAGNOSIS — Z8701 Personal history of pneumonia (recurrent): Secondary | ICD-10-CM

## 2022-01-10 DIAGNOSIS — R1084 Generalized abdominal pain: Secondary | ICD-10-CM | POA: Diagnosis not present

## 2022-01-10 DIAGNOSIS — K219 Gastro-esophageal reflux disease without esophagitis: Secondary | ICD-10-CM | POA: Diagnosis present

## 2022-01-10 DIAGNOSIS — K8064 Calculus of gallbladder and bile duct with chronic cholecystitis without obstruction: Secondary | ICD-10-CM | POA: Diagnosis not present

## 2022-01-10 DIAGNOSIS — K8060 Calculus of gallbladder and bile duct with cholecystitis, unspecified, without obstruction: Secondary | ICD-10-CM | POA: Diagnosis not present

## 2022-01-10 DIAGNOSIS — N281 Cyst of kidney, acquired: Secondary | ICD-10-CM | POA: Diagnosis not present

## 2022-01-10 DIAGNOSIS — K760 Fatty (change of) liver, not elsewhere classified: Secondary | ICD-10-CM | POA: Diagnosis not present

## 2022-01-10 DIAGNOSIS — Z91013 Allergy to seafood: Secondary | ICD-10-CM

## 2022-01-10 DIAGNOSIS — N179 Acute kidney failure, unspecified: Secondary | ICD-10-CM | POA: Diagnosis present

## 2022-01-10 DIAGNOSIS — T451X5A Adverse effect of antineoplastic and immunosuppressive drugs, initial encounter: Secondary | ICD-10-CM | POA: Diagnosis present

## 2022-01-10 DIAGNOSIS — R0689 Other abnormalities of breathing: Secondary | ICD-10-CM | POA: Diagnosis not present

## 2022-01-10 DIAGNOSIS — E785 Hyperlipidemia, unspecified: Secondary | ICD-10-CM | POA: Diagnosis present

## 2022-01-10 DIAGNOSIS — Z8249 Family history of ischemic heart disease and other diseases of the circulatory system: Secondary | ICD-10-CM

## 2022-01-10 DIAGNOSIS — I509 Heart failure, unspecified: Secondary | ICD-10-CM | POA: Diagnosis not present

## 2022-01-10 DIAGNOSIS — C7970 Secondary malignant neoplasm of unspecified adrenal gland: Secondary | ICD-10-CM | POA: Diagnosis not present

## 2022-01-10 DIAGNOSIS — M797 Fibromyalgia: Secondary | ICD-10-CM | POA: Diagnosis present

## 2022-01-10 DIAGNOSIS — Q2112 Patent foramen ovale: Secondary | ICD-10-CM | POA: Diagnosis not present

## 2022-01-10 DIAGNOSIS — R748 Abnormal levels of other serum enzymes: Secondary | ICD-10-CM | POA: Diagnosis not present

## 2022-01-10 DIAGNOSIS — F32A Depression, unspecified: Secondary | ICD-10-CM | POA: Diagnosis present

## 2022-01-10 DIAGNOSIS — R935 Abnormal findings on diagnostic imaging of other abdominal regions, including retroperitoneum: Secondary | ICD-10-CM | POA: Diagnosis not present

## 2022-01-10 DIAGNOSIS — Z806 Family history of leukemia: Secondary | ICD-10-CM

## 2022-01-10 DIAGNOSIS — T827XXD Infection and inflammatory reaction due to other cardiac and vascular devices, implants and grafts, subsequent encounter: Secondary | ICD-10-CM | POA: Diagnosis not present

## 2022-01-10 DIAGNOSIS — R079 Chest pain, unspecified: Secondary | ICD-10-CM | POA: Diagnosis not present

## 2022-01-10 DIAGNOSIS — Z91041 Radiographic dye allergy status: Secondary | ICD-10-CM

## 2022-01-10 DIAGNOSIS — R4182 Altered mental status, unspecified: Secondary | ICD-10-CM | POA: Diagnosis not present

## 2022-01-10 DIAGNOSIS — Z79899 Other long term (current) drug therapy: Secondary | ICD-10-CM

## 2022-01-10 DIAGNOSIS — Z9861 Coronary angioplasty status: Secondary | ICD-10-CM | POA: Diagnosis not present

## 2022-01-10 DIAGNOSIS — R000 Tachycardia, unspecified: Secondary | ICD-10-CM | POA: Diagnosis not present

## 2022-01-10 DIAGNOSIS — Z8719 Personal history of other diseases of the digestive system: Secondary | ICD-10-CM

## 2022-01-10 DIAGNOSIS — Z825 Family history of asthma and other chronic lower respiratory diseases: Secondary | ICD-10-CM

## 2022-01-10 DIAGNOSIS — K2980 Duodenitis without bleeding: Secondary | ICD-10-CM | POA: Diagnosis present

## 2022-01-10 DIAGNOSIS — K8043 Calculus of bile duct with acute cholecystitis with obstruction: Secondary | ICD-10-CM | POA: Diagnosis not present

## 2022-01-10 DIAGNOSIS — R6520 Severe sepsis without septic shock: Secondary | ICD-10-CM | POA: Diagnosis not present

## 2022-01-10 DIAGNOSIS — Z86010 Personal history of colonic polyps: Secondary | ICD-10-CM

## 2022-01-10 DIAGNOSIS — Z955 Presence of coronary angioplasty implant and graft: Secondary | ICD-10-CM

## 2022-01-10 DIAGNOSIS — J42 Unspecified chronic bronchitis: Secondary | ICD-10-CM | POA: Diagnosis not present

## 2022-01-10 DIAGNOSIS — Z808 Family history of malignant neoplasm of other organs or systems: Secondary | ICD-10-CM

## 2022-01-10 DIAGNOSIS — K660 Peritoneal adhesions (postprocedural) (postinfection): Secondary | ICD-10-CM | POA: Diagnosis present

## 2022-01-10 DIAGNOSIS — T827XXA Infection and inflammatory reaction due to other cardiac and vascular devices, implants and grafts, initial encounter: Secondary | ICD-10-CM | POA: Diagnosis not present

## 2022-01-10 DIAGNOSIS — C3490 Malignant neoplasm of unspecified part of unspecified bronchus or lung: Secondary | ICD-10-CM | POA: Diagnosis not present

## 2022-01-10 DIAGNOSIS — K269 Duodenal ulcer, unspecified as acute or chronic, without hemorrhage or perforation: Secondary | ICD-10-CM | POA: Diagnosis not present

## 2022-01-10 LAB — CBC WITH DIFFERENTIAL/PLATELET
Abs Immature Granulocytes: 0.01 10*3/uL (ref 0.00–0.07)
Basophils Absolute: 0 10*3/uL (ref 0.0–0.1)
Basophils Relative: 0 %
Eosinophils Absolute: 0.1 10*3/uL (ref 0.0–0.5)
Eosinophils Relative: 2 %
HCT: 43.1 % (ref 39.0–52.0)
Hemoglobin: 13.8 g/dL (ref 13.0–17.0)
Immature Granulocytes: 0 %
Lymphocytes Relative: 11 %
Lymphs Abs: 0.5 10*3/uL — ABNORMAL LOW (ref 0.7–4.0)
MCH: 31.7 pg (ref 26.0–34.0)
MCHC: 32 g/dL (ref 30.0–36.0)
MCV: 98.9 fL (ref 80.0–100.0)
Monocytes Absolute: 0.2 10*3/uL (ref 0.1–1.0)
Monocytes Relative: 3 %
Neutro Abs: 3.7 10*3/uL (ref 1.7–7.7)
Neutrophils Relative %: 84 %
Platelets: 148 10*3/uL — ABNORMAL LOW (ref 150–400)
RBC: 4.36 MIL/uL (ref 4.22–5.81)
RDW: 13.1 % (ref 11.5–15.5)
WBC: 4.5 10*3/uL (ref 4.0–10.5)
nRBC: 0 % (ref 0.0–0.2)

## 2022-01-10 LAB — COMPREHENSIVE METABOLIC PANEL
ALT: 221 U/L — ABNORMAL HIGH (ref 0–44)
AST: 372 U/L — ABNORMAL HIGH (ref 15–41)
Albumin: 3.8 g/dL (ref 3.5–5.0)
Alkaline Phosphatase: 116 U/L (ref 38–126)
Anion gap: 10 (ref 5–15)
BUN: 13 mg/dL (ref 8–23)
CO2: 25 mmol/L (ref 22–32)
Calcium: 8.6 mg/dL — ABNORMAL LOW (ref 8.9–10.3)
Chloride: 106 mmol/L (ref 98–111)
Creatinine, Ser: 1.35 mg/dL — ABNORMAL HIGH (ref 0.61–1.24)
GFR, Estimated: 58 mL/min — ABNORMAL LOW (ref >60)
Glucose, Bld: 139 mg/dL — ABNORMAL HIGH (ref 70–99)
Potassium: 3.7 mmol/L (ref 3.5–5.1)
Sodium: 141 mmol/L (ref 135–145)
Total Bilirubin: 2.1 mg/dL — ABNORMAL HIGH (ref 0.3–1.2)
Total Protein: 6.9 g/dL (ref 6.5–8.1)

## 2022-01-10 LAB — URINALYSIS, ROUTINE W REFLEX MICROSCOPIC
Bacteria, UA: NONE SEEN
Bilirubin Urine: NEGATIVE
Glucose, UA: NEGATIVE mg/dL
Ketones, ur: NEGATIVE mg/dL
Leukocytes,Ua: NEGATIVE
Nitrite: NEGATIVE
Protein, ur: NEGATIVE mg/dL
Specific Gravity, Urine: 1.01 (ref 1.005–1.030)
pH: 6 (ref 5.0–8.0)

## 2022-01-10 LAB — BILIRUBIN, DIRECT: Bilirubin, Direct: 1.6 mg/dL — ABNORMAL HIGH (ref 0.0–0.2)

## 2022-01-10 LAB — LACTIC ACID, PLASMA
Lactic Acid, Venous: 2.9 mmol/L (ref 0.5–1.9)
Lactic Acid, Venous: 2.4 mmol/L (ref 0.5–1.9)
Lactic Acid, Venous: 2 mmol/L (ref 0.5–1.9)

## 2022-01-10 LAB — MAGNESIUM: Magnesium: 1.3 mg/dL — ABNORMAL LOW (ref 1.7–2.4)

## 2022-01-10 LAB — TROPONIN I (HIGH SENSITIVITY)
Troponin I (High Sensitivity): 14 ng/L (ref <18)
Troponin I (High Sensitivity): 20 ng/L — ABNORMAL HIGH (ref <18)

## 2022-01-10 LAB — PROTIME-INR
INR: 1.1 (ref 0.8–1.2)
Prothrombin Time: 14 seconds (ref 11.4–15.2)

## 2022-01-10 LAB — RESP PANEL BY RT-PCR (FLU A&B, COVID) ARPGX2
Influenza A by PCR: NEGATIVE
Influenza B by PCR: NEGATIVE
SARS Coronavirus 2 by RT PCR: NEGATIVE

## 2022-01-10 LAB — BASIC METABOLIC PANEL
Anion gap: 7 (ref 5–15)
BUN: 12 mg/dL (ref 8–23)
CO2: 23 mmol/L (ref 22–32)
Calcium: 8.1 mg/dL — ABNORMAL LOW (ref 8.9–10.3)
Chloride: 105 mmol/L (ref 98–111)
Creatinine, Ser: 1.23 mg/dL (ref 0.61–1.24)
GFR, Estimated: >60 mL/min (ref >60)
Glucose, Bld: 139 mg/dL — ABNORMAL HIGH (ref 70–99)
Potassium: 3.1 mmol/L — ABNORMAL LOW (ref 3.5–5.1)
Sodium: 135 mmol/L (ref 135–145)

## 2022-01-10 LAB — LIPASE, BLOOD: Lipase: 45 U/L (ref 11–51)

## 2022-01-10 LAB — MRSA NEXT GEN BY PCR, NASAL: MRSA by PCR Next Gen: NOT DETECTED

## 2022-01-10 LAB — APTT: aPTT: 30 seconds (ref 24–36)

## 2022-01-10 MED ORDER — HYDROMORPHONE HCL 1 MG/ML IJ SOLN
1.0000 mg | Freq: Once | INTRAMUSCULAR | Status: AC
  Administered 2022-01-10: 1 mg via INTRAVENOUS
  Filled 2022-01-10: qty 1

## 2022-01-10 MED ORDER — VANCOMYCIN HCL 1500 MG/300ML IV SOLN: 1500.0000 mg | INTRAVENOUS | Status: DC

## 2022-01-10 MED ORDER — POTASSIUM CHLORIDE 10 MEQ/100ML IV SOLN
10.0000 meq | INTRAVENOUS | Status: AC
  Administered 2022-01-10 – 2022-01-11 (×4): 10 meq via INTRAVENOUS
  Filled 2022-01-10 (×4): qty 100

## 2022-01-10 MED ORDER — ONDANSETRON HCL 4 MG PO TABS: 4.0000 mg | ORAL_TABLET | Freq: Four times a day (QID) | ORAL | Status: DC | PRN

## 2022-01-10 MED ORDER — ENOXAPARIN SODIUM 40 MG/0.4ML IJ SOSY
40.0000 mg | PREFILLED_SYRINGE | INTRAMUSCULAR | Status: DC
  Administered 2022-01-10 – 2022-01-11 (×2): 40 mg via SUBCUTANEOUS
  Filled 2022-01-10 (×2): qty 0.4

## 2022-01-10 MED ORDER — METRONIDAZOLE 500 MG/100ML IV SOLN
500.0000 mg | Freq: Once | INTRAVENOUS | Status: AC
  Administered 2022-01-10: 500 mg via INTRAVENOUS
  Filled 2022-01-10: qty 100

## 2022-01-10 MED ORDER — SODIUM CHLORIDE 0.9 % IV SOLN
2.0000 g | Freq: Once | INTRAVENOUS | Status: AC
  Administered 2022-01-10: 2 g via INTRAVENOUS
  Filled 2022-01-10: qty 12.5

## 2022-01-10 MED ORDER — HYDROMORPHONE HCL 1 MG/ML IJ SOLN: 1.0000 mg | INTRAMUSCULAR | Status: DC | PRN

## 2022-01-10 MED ORDER — METRONIDAZOLE 500 MG/100ML IV SOLN
500.0000 mg | Freq: Two times a day (BID) | INTRAVENOUS | Status: DC
  Administered 2022-01-10: 500 mg via INTRAVENOUS
  Filled 2022-01-10: qty 100

## 2022-01-10 MED ORDER — VANCOMYCIN HCL IN DEXTROSE 1-5 GM/200ML-% IV SOLN: 1000.0000 mg | Freq: Once | INTRAVENOUS | Status: DC

## 2022-01-10 MED ORDER — LACTATED RINGERS IV BOLUS
1000.0000 mL | Freq: Once | INTRAVENOUS | Status: AC
  Administered 2022-01-10: 1000 mL via INTRAVENOUS

## 2022-01-10 MED ORDER — MAGNESIUM SULFATE 2 GM/50ML IV SOLN
2.0000 g | Freq: Once | INTRAVENOUS | Status: AC
  Administered 2022-01-10: 2 g via INTRAVENOUS
  Filled 2022-01-10: qty 50

## 2022-01-10 MED ORDER — ORAL CARE MOUTH RINSE: 15.0000 mL | OROMUCOSAL | Status: DC | PRN

## 2022-01-10 MED ORDER — HYDROMORPHONE HCL 1 MG/ML IJ SOLN
0.5000 mg | Freq: Once | INTRAMUSCULAR | Status: AC | PRN
  Administered 2022-01-10: 0.5 mg via INTRAVENOUS
  Filled 2022-01-10: qty 1

## 2022-01-10 MED ORDER — CHLORHEXIDINE GLUCONATE CLOTH 2 % EX PADS
6.0000 | MEDICATED_PAD | Freq: Every day | CUTANEOUS | Status: DC
  Administered 2022-01-10 – 2022-01-14 (×5): 6 via TOPICAL

## 2022-01-10 MED ORDER — FENTANYL CITRATE PF 50 MCG/ML IJ SOSY
50.0000 ug | PREFILLED_SYRINGE | Freq: Once | INTRAMUSCULAR | Status: AC
  Administered 2022-01-10: 50 ug via INTRAVENOUS
  Filled 2022-01-10: qty 1

## 2022-01-10 MED ORDER — LACTATED RINGERS IV BOLUS (SEPSIS)
1000.0000 mL | Freq: Once | INTRAVENOUS | Status: AC
  Administered 2022-01-10: 1000 mL via INTRAVENOUS

## 2022-01-10 MED ORDER — LACTATED RINGERS IV SOLN: INTRAVENOUS | Status: DC

## 2022-01-10 MED ORDER — FENTANYL CITRATE PF 50 MCG/ML IJ SOSY
50.0000 ug | PREFILLED_SYRINGE | INTRAMUSCULAR | Status: DC | PRN
  Administered 2022-01-10 – 2022-01-11 (×6): 50 ug via INTRAVENOUS
  Filled 2022-01-10 (×6): qty 1

## 2022-01-10 MED ORDER — ONDANSETRON HCL 4 MG/2ML IJ SOLN: 4.0000 mg | Freq: Four times a day (QID) | INTRAMUSCULAR | Status: DC | PRN

## 2022-01-10 MED ORDER — LEVALBUTEROL HCL 1.25 MG/0.5ML IN NEBU: 1.2500 mg | INHALATION_SOLUTION | Freq: Four times a day (QID) | RESPIRATORY_TRACT | Status: DC | PRN

## 2022-01-10 MED ORDER — SODIUM CHLORIDE 0.9 % IV SOLN
2.0000 g | Freq: Three times a day (TID) | INTRAVENOUS | Status: DC
  Administered 2022-01-10 – 2022-01-11 (×2): 2 g via INTRAVENOUS
  Filled 2022-01-10 (×2): qty 12.5

## 2022-01-10 MED ORDER — VANCOMYCIN HCL 2000 MG/400ML IV SOLN
2000.0000 mg | Freq: Once | INTRAVENOUS | Status: AC
  Administered 2022-01-10: 2000 mg via INTRAVENOUS
  Filled 2022-01-10: qty 400

## 2022-01-10 MED ORDER — ORAL CARE MOUTH RINSE
15.0000 mL | OROMUCOSAL | Status: DC
  Administered 2022-01-10 – 2022-01-22 (×39): 15 mL via OROMUCOSAL

## 2022-01-10 NOTE — Progress Notes (Signed)
Pharmacy Antibiotic Note  [name] [name] is a 66 y.o. male admitted on 01/10/2022 with  IAI .  Provider has ordered cefepime 2 g IV q8h, Flagyl 500mg  IV q12h and consulted Pharmacy for vancomycin dosing.  In ED, vancomycin 2 g IV administered today at 1327  Plan: Continue vancomycin at 1500 mg IV every 24 hours (Goal AUC 400-550, eAUC 499, SCr used: 1.35) Monitor clinical progress, renal function, vancomycin levels as indicated F/U C&S, abx deescalation / LOT   Height: 5\' 9"  (175.3 cm) Weight: 122.5 kg (270 lb) IBW/kg (Calculated) : 70.7  Temp (24hrs), Avg:101.7 F (38.7 C), Min:101.7 F (38.7 C), Max:101.7 F (38.7 C)  Recent Labs  Lab 01/10/22 1218 01/10/22 1219 01/10/22 1448  WBC  --  4.5  --   CREATININE  --  1.35*  --   LATICACIDVEN 2.9*  --  2.4*    Estimated Creatinine Clearance: 69.6 mL/min (A) (by C-G formula based on SCr of 1.35 mg/dL (H)).    Allergies  Allergen Reactions   Fish Allergy Nausea And Vomiting   Iohexol Other (See Comments)     Code: HIVES, Desc: PT STATES HE HAD AN IVP 15 YRS AGO AND HAD SOB AND BROKE OUT IN HIVES., Onset Date: [id number]    Ivp Dye [Iodinated Contrast Media] Other (See Comments)    intolerance    Microbiology results: 11/6 BCx: sent 11/6 UCx: sent    Thank you for allowing pharmacy to be a part of this patient's care.  Suzzanne Cloud, PharmD, BCPS 01/10/2022 4:05 PM

## 2022-01-10 NOTE — Progress Notes (Signed)
A consult was received from an ED physician for Vancomycin and Cefepime per pharmacy dosing.  The patient's profile has been reviewed for ht/wt/allergies/indication/available labs.    A one time order has been placed for Vancomycin 2g IV and Cefepime 2g IV.  Further antibiotics/pharmacy consults should be ordered by admitting physician if indicated.                       Thank you, Luiz Ochoa 01/10/2022  12:30 PM
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
## 2022-01-10 NOTE — ED Provider Notes (Signed)
[place] DEPT Provider Note   CSN: [id number] Arrival date & time: 01/10/22  1152     History  Chief Complaint  Patient presents with   Abdominal Pain    [name] [name] is a 66 y.o. male.  Patient with a history of metastatic lung cancer, 5 myalgia, GERD, COPD, chronic pain, hypertension presenting with concerns for sepsis.  Developed abdominal pain this morning with nausea, vomiting and diarrhea.  States he vomited 2 times as well as had multiple episodes of diarrhea.  Febrile to 102 for EMS.  Tachycardic and febrile on arrival.  Complaining of diffuse abdominal pain and headache.  History is limited as patient is quite uncomfortable and not answering questions appropriately. He is apparently on hospice for lung cancer but remains a full code.   The history is provided by the patient and the EMS personnel. The history is limited by the condition of the patient.  Abdominal Pain      Home Medications Prior to Admission medications   Medication Sig Start Date End Date Taking? Authorizing Provider  acetaminophen (TYLENOL) 500 MG tablet Take 500-1,000 mg by mouth every 6 (six) hours as needed for moderate pain or headache.    [provider]  albuterol (PROVENTIL) (2.5 MG/3ML) 0.083% nebulizer solution Take 3 mLs (2.5 mg total) by nebulization every 6 (six) hours as needed for wheezing or shortness of breath. 11/25/21   Icard, Octavio Graves, DO  allopurinol (ZYLOPRIM) 100 MG tablet Take 200 mg daily by mouth.    [provider]  BRILINTA 60 MG TABS tablet TAKE 1 TABLET(60 MG) BY MOUTH TWICE DAILY 07/01/21   [name], MD  Budeson-Glycopyrrol-Formoterol (BREZTRI AEROSPHERE) 160-9-4.8 MCG/ACT AERO Inhale 2 puffs into the lungs in the morning and at bedtime. 07/29/21   Garner Nash, DO  calcium carbonate (TUMS EX) 750 MG chewable tablet Chew 373-746 tablets by mouth daily as needed for heartburn.    [provider]   clonazePAM (KLONOPIN) 1 MG tablet Take 1 mg by mouth at bedtime as needed for anxiety. 09/29/20   [provider]  diphenhydrAMINE (BENADRYL) 50 MG tablet Take 1 tablet (50 mg total) by mouth as directed. Take one tablet ( 50 mg) one hour prior to CT scan Patient not taking: Reported on 10/21/2021 07/16/21   [name], MD  docusate sodium (COLACE) 100 MG capsule Take 1 capsule (100 mg total) by mouth 2 (two) times daily. Patient not taking: Reported on 10/21/2021 02/13/21   [name], MD  fluticasone [facility]) 50 MCG/ACT nasal spray Place 1 spray into both nostrils daily as needed for allergies.     [provider]  furosemide (LASIX) 20 MG tablet TAKE 1 TABLET(20 MG) BY MOUTH DAILY AS NEEDED Patient not taking: Reported on 10/21/2021 09/06/21   [name], MD  hydrOXYzine (VISTARIL) 50 MG capsule Take 50 mg by mouth 3 (three) times daily as needed for itching. 05/23/19   [provider]  morphine (MS CONTIN) 60 MG 12 hr tablet 60 mg every 12 (twelve) hours. 12/21/20   [provider]  nitroGLYCERIN (NITROSTAT) 0.4 MG SL tablet Place 1 tablet (0.4 mg total) under the tongue every 5 (five) minutes as needed for chest pain. X 3 doses Patient not taking: Reported on 10/21/2021 06/25/21   Almyra Deforest, PA  potassium chloride (KLOR-CON) 10 MEQ tablet Take 10 mEq by mouth as needed. Take when Lasix is taken    [provider]  rosuvastatin (  CRESTOR) 20 MG tablet Take 1 tablet (20 mg total) by mouth daily. 06/25/21   Almyra Deforest, PA      Allergies    Fish allergy, Iohexol, and Ivp dye [iodinated contrast media]    Review of Systems   Review of Systems  Unable to perform ROS: Mental status change  Gastrointestinal:  Positive for abdominal pain.    Physical Exam Updated Vital Signs BP (!) 151/112 (BP Location: Left Arm)   Pulse (!) 138   Temp (!) 101.7 F (38.7 C) (Rectal)   Resp 18   SpO2 94%  Physical Exam Vitals and nursing note reviewed.   Constitutional:      General: He is not in acute distress.    Appearance: He is well-developed. He is obese. He is ill-appearing.     Comments: Ill-appearing, tachycardic, moaning and groaning.  HENT:     Head: Normocephalic and atraumatic.     Mouth/Throat:     Pharynx: No oropharyngeal exudate.  Eyes:     Conjunctiva/sclera: Conjunctivae normal.     Pupils: Pupils are equal, round, and reactive to light.  Neck:     Comments: No meningismus Cardiovascular:     Rate and Rhythm: Regular rhythm. Tachycardia present.     Heart sounds: Normal heart sounds. No murmur heard. Pulmonary:     Effort: Pulmonary effort is normal. No respiratory distress.     Breath sounds: Normal breath sounds.  Abdominal:     Palpations: Abdomen is soft.     Tenderness: There is abdominal tenderness. There is guarding. There is no rebound.  Musculoskeletal:        General: No tenderness. Normal range of motion.     Cervical back: Normal range of motion and neck supple.  Skin:    General: Skin is warm.     Capillary Refill: Capillary refill takes less than 2 seconds.  Neurological:     General: No focal deficit present.     Mental Status: He is alert and oriented to person, place, and time. Mental status is at baseline.     Cranial Nerves: No cranial nerve deficit.     Motor: No abnormal muscle tone.     Coordination: Coordination normal.     Comments:  5/5 strength throughout. CN 2-12 intact.Equal grip strength.   Psychiatric:        Behavior: Behavior normal.     ED Results / Procedures / Treatments   Labs (all labs ordered are listed, but only abnormal results are displayed) Labs Reviewed  LACTIC ACID, PLASMA - Abnormal; Notable for the following components:      Result Value   Lactic Acid, Venous 2.9 (*)    All other components within normal limits  LACTIC ACID, PLASMA - Abnormal; Notable for the following components:   Lactic Acid, Venous 2.4 (*)    All other components within normal  limits  COMPREHENSIVE METABOLIC PANEL - Abnormal; Notable for the following components:   Glucose, Bld 139 (*)    Creatinine, Ser 1.35 (*)    Calcium 8.6 (*)    AST 372 (*)    ALT 221 (*)    Total Bilirubin 2.1 (*)    GFR, Estimated 58 (*)    All other components within normal limits  CBC WITH DIFFERENTIAL/PLATELET - Abnormal; Notable for the following components:   Platelets 148 (*)    Lymphs Abs 0.5 (*)    All other components within normal limits  URINALYSIS, ROUTINE W REFLEX MICROSCOPIC -  Abnormal; Notable for the following components:   Hgb urine dipstick MODERATE (*)    All other components within normal limits  BILIRUBIN, DIRECT - Abnormal; Notable for the following components:   Bilirubin, Direct 1.6 (*)    All other components within normal limits  RESP PANEL BY RT-PCR (FLU A&B, COVID) ARPGX2  CULTURE, BLOOD (ROUTINE X 2)  CULTURE, BLOOD (ROUTINE X 2)  URINE CULTURE  MRSA NEXT GEN BY PCR, NASAL  PROTIME-INR  APTT  LIPASE, BLOOD  HEPATITIS PANEL, ACUTE  TROPONIN I (HIGH SENSITIVITY)  TROPONIN I (HIGH SENSITIVITY)    EKG EKG Interpretation  Date/Time:  Monday January 10 2022 12:17:42 EST Ventricular Rate:  150 PR Interval:  76 QRS Duration: 93 QT Interval:  317 QTC Calculation: 501 R Axis:   80 Text Interpretation: Sinus tachycardia Ventricular premature complex Aberrant complex Low voltage, extremity and precordial leads Repolarization abnormality, prob rate related Prolonged QT interval Rate faster ST depressions laterally Confirmed by Ezequiel Essex [phone]) on 01/10/2022 12:49:59 PM  Radiology DG Chest Port 1 View  Result Date: 01/10/2022 CLINICAL DATA:  Concern for sepsis. EXAM: PORTABLE CHEST 1 VIEW COMPARISON:  05/17/2021; 04/09/2021 FINDINGS: Grossly unchanged enlarged cardiac silhouette and mediastinal contours with nodular prominence of the pulmonary hila. Mild pulmonary venous congestion without frank evidence of edema. No focal airspace opacities.  No pleural effusion or pneumothorax. Stable position of support apparatus. No acute osseous abnormalities. IMPRESSION: Pulmonary venous congestion without frank evidence of edema on this AP portable examination. No discrete focal airspace opacities to suggest pneumonia. Further evaluation with a PA and lateral chest radiograph may be obtained as clinically indicated. Electronically Signed   By: Sandi Mariscal M.D.   On: 01/10/2022 13:33   CT ABDOMEN PELVIS WO CONTRAST  Result Date: 01/10/2022 CLINICAL DATA:  Abdominal pain, acute, nonlocalized. Patient reports inability to urinate. EXAM: CT ABDOMEN AND PELVIS WITHOUT CONTRAST TECHNIQUE: Multidetector CT imaging of the abdomen and pelvis was performed following the standard protocol without IV contrast. RADIATION DOSE REDUCTION: This exam was performed according to the departmental dose-optimization program which includes automated exposure control, adjustment of the mA and/or kV according to patient size and/or use of iterative reconstruction technique. COMPARISON:  10/19/2021 FINDINGS: Lower chest: Lung bases are clear.  No pleural or pericardial fluid. Hepatobiliary: Liver parenchyma is normal. Small amount of air in the biliary tree, less than seen previously. Few calcified stones dependent within the gallbladder. No sign of ductal dilatation. However, there is suspicion of a 4 mm stone at the ampulla. Pancreas: Fatty atrophy.  No acute finding. Spleen: Normal Adrenals/Urinary Tract: Right adrenal gland is normal. 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 No pronounced change. No change in appearance of the kidneys. Bilateral cysts for which specific follow-up is not suggested. No hydronephrosis. Bladder is normal. Stomach/Bowel: Stomach and small intestine are normal. Normal appearance of the colon. Vascular/Lymphatic: Aortic atherosclerosis. No aneurysm. No abdominal lymphadenopathy. Small portal region nodes are  unchanged. Reproductive: Normal Other: No free fluid or air. Musculoskeletal: No evidence of fracture or lytic destructive bone lesion. IMPRESSION: 1. Few calcified stones dependent within the gallbladder. No sign of ductal dilatation. However, there is suspicion of a 4 mm stone at the ampulla. 2. 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 No pronounced change. 3. Aortic atherosclerosis. 4. No hydronephrosis.  No evidence of bladder outlet obstruction. Aortic Atherosclerosis (ICD10-I70.0). Electronically Signed   By: Jan Fireman.D.  On: 01/10/2022 13:28   CT Head Wo Contrast  Result Date: 01/10/2022 CLINICAL DATA:  Mental status change.  Unknown cause. EXAM: CT HEAD WITHOUT CONTRAST TECHNIQUE: Contiguous axial images were obtained from the base of the skull through the vertex without intravenous contrast. RADIATION DOSE REDUCTION: This exam was performed according to the departmental dose-optimization program which includes automated exposure control, adjustment of the mA and/or kV according to patient size and/or use of iterative reconstruction technique. COMPARISON:  CT examination dated May 17, 2021. FINDINGS: Brain: No evidence of acute infarction, hemorrhage, hydrocephalus, extra-axial collection or mass lesion/mass effect. Patchy area of low-attenuation of the periventricular white matter presumed chronic microvascular ischemic changes. Vascular: No hyperdense vessel or unexpected calcification. Skull: Normal. Negative for fracture or focal lesion. Sinuses/Orbits: Mucosal thickening and patchy opacification of the ethmoid air cells. Other: None. IMPRESSION: 1. No acute intracranial abnormality. 2. Chronic microvascular ischemic changes of the periventricular white matter. 3. Paranasal sinus disease. Electronically Signed   By: Keane Police D.O.   On: 01/10/2022 13:19    Procedures .Critical Care  Performed by: Ezequiel Essex, MD Authorized by:  Ezequiel Essex, MD   Critical care provider statement:    Critical care time (minutes):  60   Critical care time was exclusive of:  Separately billable procedures and treating other patients   Critical care was necessary to treat or prevent imminent or life-threatening deterioration of the following conditions:  Sepsis   Critical care was time spent personally by me on the following activities:  Development of treatment plan with patient or surrogate, discussions with consultants, evaluation of patient's response to treatment, examination of patient, ordering and review of laboratory studies, ordering and review of radiographic studies, ordering and performing treatments and interventions, pulse oximetry, re-evaluation of patient's condition, review of old charts, blood draw for specimens and obtaining history from patient or surrogate   I assumed direction of critical care for this patient from another provider in my specialty: no     Care discussed with: admitting provider       Medications Ordered in ED Medications  lactated ringers infusion (has no administration in time range)  lactated ringers bolus 1,000 mL (has no administration in time range)  ceFEPIme (MAXIPIME) 2 g in sodium chloride 0.9 % 100 mL IVPB (has no administration in time range)  metroNIDAZOLE (FLAGYL) IVPB 500 mg (has no administration in time range)  vancomycin (VANCOCIN) IVPB 1000 mg/200 mL premix (has no administration in time range)    ED Course/ Medical Decision Making/ A&P                           Medical Decision Making Amount and/or Complexity of Data Reviewed Labs: ordered. Decision-making details documented in ED Course. Radiology: ordered and independent interpretation performed. Decision-making details documented in ED Course. ECG/medicine tests: ordered and independent interpretation performed. Decision-making details documented in ED Course.  Risk Prescription drug management. Decision regarding  hospitalization.   Patient arrives with abdominal pain, fever, vomiting. Code sepsis activated on arrival.   IV fluids and IV antibiotics after cultures obtained.   Appears somewhat delirious and encephalopathic, unclear what baseline mentation is. Apparently lives on own and called EMS himself.   Lactate 2.9. WBC normal. Transaminitis with total bilirubin 2.1  CT head without acute findings. CT abdomen with gallstones, no evidence of cholecystitis, but does have 87mm stone at ampulla.   Sepsis likely from intraabdominal source. Has had choledocholithiasis in past  but still has gallbladder. Sister states "they wouldn't remove it because of his cancer".   D/w Cherylin Mylar of general surgery. They will evaluate. D/w Dr. Henrene Pastor and GI team as well. Concern for cholangitis.   Will require admission for intraabdominal sepsis and likely cholangitis. D/w Dr. Olevia Bowens.        Final Clinical Impression(s) / ED Diagnoses Final diagnoses:  Sepsis with encephalopathy without septic shock, due to unspecified organism [facility])  Choledocholithiasis    Rx / DC Orders ED Discharge Orders     None         Threasa Kinch, Annie Main, MD 01/10/22 1652

## 2022-01-10 NOTE — ED Triage Notes (Signed)
Pt bib [facility] from home d/t abdominal pain, N/V and fever. Pt given Compazine PTA by family. Unclear what pt's mental status baseline is.
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
## 2022-01-11 ENCOUNTER — Inpatient Hospital Stay (HOSPITAL_COMMUNITY): Payer: Medicare Other

## 2022-01-11 DIAGNOSIS — I25110 Atherosclerotic heart disease of native coronary artery with unstable angina pectoris: Secondary | ICD-10-CM

## 2022-01-11 DIAGNOSIS — K8050 Calculus of bile duct without cholangitis or cholecystitis without obstruction: Secondary | ICD-10-CM | POA: Diagnosis not present

## 2022-01-11 DIAGNOSIS — A419 Sepsis, unspecified organism: Secondary | ICD-10-CM | POA: Diagnosis not present

## 2022-01-11 DIAGNOSIS — R1010 Upper abdominal pain, unspecified: Secondary | ICD-10-CM

## 2022-01-11 DIAGNOSIS — R748 Abnormal levels of other serum enzymes: Secondary | ICD-10-CM | POA: Diagnosis not present

## 2022-01-11 LAB — CBC WITH DIFFERENTIAL/PLATELET
Abs Immature Granulocytes: 0.03 10*3/uL (ref 0.00–0.07)
Basophils Absolute: 0 10*3/uL (ref 0.0–0.1)
Basophils Relative: 0 %
Eosinophils Absolute: 0 10*3/uL (ref 0.0–0.5)
Eosinophils Relative: 0 %
HCT: 39 % (ref 39.0–52.0)
Hemoglobin: 13 g/dL (ref 13.0–17.0)
Immature Granulocytes: 0 %
Lymphocytes Relative: 5 %
Lymphs Abs: 0.4 10*3/uL — ABNORMAL LOW (ref 0.7–4.0)
MCH: 32.6 pg (ref 26.0–34.0)
MCHC: 33.3 g/dL (ref 30.0–36.0)
MCV: 97.7 fL (ref 80.0–100.0)
Monocytes Absolute: 0.1 10*3/uL (ref 0.1–1.0)
Monocytes Relative: 2 %
Neutro Abs: 8.9 10*3/uL — ABNORMAL HIGH (ref 1.7–7.7)
Neutrophils Relative %: 93 %
Platelets: 136 10*3/uL — ABNORMAL LOW (ref 150–400)
RBC: 3.99 MIL/uL — ABNORMAL LOW (ref 4.22–5.81)
RDW: 13.6 % (ref 11.5–15.5)
WBC: 9.5 10*3/uL (ref 4.0–10.5)
nRBC: 0 % (ref 0.0–0.2)

## 2022-01-11 LAB — BLOOD CULTURE ID PANEL (REFLEXED) - BCID2

## 2022-01-11 LAB — AMMONIA: Ammonia: 24 umol/L (ref 9–35)

## 2022-01-11 LAB — TECHNOLOGIST SMEAR REVIEW: Plt Morphology: NORMAL

## 2022-01-11 LAB — ECHOCARDIOGRAM COMPLETE
Area-P 1/2: 4.69 cm2
Calc EF: 63.4 %
Height: 69 in
S' Lateral: 3.7 cm
Single Plane A2C EF: 62.3 %
Single Plane A4C EF: 61.4 %
Weight: 3784.86 oz

## 2022-01-11 LAB — HEPATITIS PANEL, ACUTE
HCV Ab: NONREACTIVE
Hep A IgM: NONREACTIVE
Hep B C IgM: NONREACTIVE
Hepatitis B Surface Ag: NONREACTIVE

## 2022-01-11 LAB — BASIC METABOLIC PANEL
Anion gap: 6 (ref 5–15)
BUN: 13 mg/dL (ref 8–23)
CO2: 22 mmol/L (ref 22–32)
Calcium: 8.1 mg/dL — ABNORMAL LOW (ref 8.9–10.3)
Chloride: 108 mmol/L (ref 98–111)
Creatinine, Ser: 1.31 mg/dL — ABNORMAL HIGH (ref 0.61–1.24)
GFR, Estimated: >60 mL/min (ref >60)
Glucose, Bld: 125 mg/dL — ABNORMAL HIGH (ref 70–99)
Potassium: 3.7 mmol/L (ref 3.5–5.1)
Sodium: 136 mmol/L (ref 135–145)

## 2022-01-11 LAB — PROTIME-INR
INR: 1.4 — ABNORMAL HIGH (ref 0.8–1.2)
Prothrombin Time: 17 seconds — ABNORMAL HIGH (ref 11.4–15.2)

## 2022-01-11 LAB — T4, FREE: Free T4: 1.58 ng/dL — ABNORMAL HIGH (ref 0.61–1.12)

## 2022-01-11 LAB — VITAMIN B12: Vitamin B-12: 391 pg/mL (ref 180–914)

## 2022-01-11 LAB — HEPATIC FUNCTION PANEL
ALT: 286 U/L — ABNORMAL HIGH (ref 0–44)
AST: 375 U/L — ABNORMAL HIGH (ref 15–41)
Albumin: 2.9 g/dL — ABNORMAL LOW (ref 3.5–5.0)
Alkaline Phosphatase: 94 U/L (ref 38–126)
Bilirubin, Direct: 3.2 mg/dL — ABNORMAL HIGH (ref 0.0–0.2)
Indirect Bilirubin: 1.7 mg/dL — ABNORMAL HIGH (ref 0.3–0.9)
Total Bilirubin: 4.9 mg/dL — ABNORMAL HIGH (ref 0.3–1.2)
Total Protein: 5.8 g/dL — ABNORMAL LOW (ref 6.5–8.1)

## 2022-01-11 LAB — MAGNESIUM: Magnesium: 1.7 mg/dL (ref 1.7–2.4)

## 2022-01-11 LAB — TSH: TSH: 0.411 u[IU]/mL (ref 0.350–4.500)

## 2022-01-11 LAB — FIBRINOGEN: Fibrinogen: 544 mg/dL — ABNORMAL HIGH (ref 210–475)

## 2022-01-11 MED ORDER — HYDROMORPHONE HCL 1 MG/ML IJ SOLN
0.5000 mg | INTRAMUSCULAR | Status: DC | PRN
  Administered 2022-01-11 (×2): 1 mg via INTRAVENOUS
  Filled 2022-01-11 (×2): qty 1

## 2022-01-11 MED ORDER — DIPHENHYDRAMINE HCL 50 MG/ML IJ SOLN: 12.5000 mg | Freq: Four times a day (QID) | INTRAMUSCULAR | Status: DC | PRN

## 2022-01-11 MED ORDER — SODIUM CHLORIDE 0.9% FLUSH: 9.0000 mL | INTRAVENOUS | Status: DC | PRN

## 2022-01-11 MED ORDER — LORAZEPAM 2 MG/ML IJ SOLN
0.5000 mg | Freq: Once | INTRAMUSCULAR | Status: AC
  Administered 2022-01-11: 0.5 mg via INTRAVENOUS
  Filled 2022-01-11: qty 1

## 2022-01-11 MED ORDER — HYDROMORPHONE 1 MG/ML IV SOLN
INTRAVENOUS | Status: DC
  Filled 2022-01-11: qty 30

## 2022-01-11 MED ORDER — LACTATED RINGERS IV SOLN: INTRAVENOUS | Status: DC

## 2022-01-11 MED ORDER — DIPHENHYDRAMINE HCL 12.5 MG/5ML PO ELIX: 12.5000 mg | ORAL_SOLUTION | Freq: Four times a day (QID) | ORAL | Status: DC | PRN

## 2022-01-11 MED ORDER — SODIUM CHLORIDE 0.9% FLUSH
10.0000 mL | Freq: Two times a day (BID) | INTRAVENOUS | Status: DC
  Administered 2022-01-11 – 2022-01-12 (×5): 10 mL
  Administered 2022-01-13: 20 mL
  Administered 2022-01-13 – 2022-01-14 (×2): 10 mL

## 2022-01-11 MED ORDER — HYDROMORPHONE 1 MG/ML IV SOLN: INTRAVENOUS | Status: DC

## 2022-01-11 MED ORDER — NALOXONE HCL 0.4 MG/ML IJ SOLN: 0.4000 mg | INTRAMUSCULAR | Status: DC | PRN

## 2022-01-11 MED ORDER — SODIUM CHLORIDE 0.9 % IV SOLN
2.0000 g | INTRAVENOUS | Status: DC
  Administered 2022-01-11 – 2022-01-12 (×2): 2 g via INTRAVENOUS
  Filled 2022-01-11 (×2): qty 20

## 2022-01-11 MED ORDER — LORAZEPAM 2 MG/ML IJ SOLN
0.5000 mg | INTRAMUSCULAR | Status: DC | PRN
  Administered 2022-01-11 – 2022-01-17 (×15): 0.5 mg via INTRAVENOUS
  Filled 2022-01-11 (×17): qty 1

## 2022-01-11 MED ORDER — SODIUM CHLORIDE 0.9% FLUSH: 10.0000 mL | INTRAVENOUS | Status: DC | PRN

## 2022-01-11 MED ORDER — ONDANSETRON HCL 4 MG/2ML IJ SOLN: 4.0000 mg | Freq: Four times a day (QID) | INTRAMUSCULAR | Status: DC | PRN

## 2022-01-11 MED ORDER — LIDOCAINE 5 % EX PTCH
1.0000 | MEDICATED_PATCH | CUTANEOUS | Status: DC
  Administered 2022-01-11 – 2022-01-21 (×8): 1 via TRANSDERMAL
  Filled 2022-01-11 (×11): qty 1

## 2022-01-11 MED ORDER — PERFLUTREN LIPID MICROSPHERE
1.0000 mL | INTRAVENOUS | Status: AC | PRN
  Administered 2022-01-11: 2 mL via INTRAVENOUS

## 2022-01-11 MED ORDER — HYDROMORPHONE HCL 1 MG/ML IJ SOLN
1.0000 mg | Freq: Once | INTRAMUSCULAR | Status: AC
  Administered 2022-01-11: 1 mg via INTRAVENOUS
  Filled 2022-01-11: qty 1

## 2022-01-11 MED ORDER — VANCOMYCIN HCL 1250 MG/250ML IV SOLN
1250.0000 mg | INTRAVENOUS | Status: DC
  Administered 2022-01-11 – 2022-01-12 (×2): 1250 mg via INTRAVENOUS
  Filled 2022-01-11 (×2): qty 250

## 2022-01-11 MED ORDER — ONDANSETRON HCL 4 MG/2ML IJ SOLN
4.0000 mg | Freq: Four times a day (QID) | INTRAMUSCULAR | Status: DC | PRN
  Administered 2022-01-11: 4 mg via INTRAVENOUS
  Filled 2022-01-11: qty 2

## 2022-01-11 NOTE — Progress Notes (Signed)
PHARMACY - PHYSICIAN COMMUNICATION CRITICAL VALUE ALERT - BLOOD CULTURE IDENTIFICATION (BCID)  [name] [name] is an 66 y.o. male who presented to [facility] on 01/10/2022 with sepsis due to possible cholecystitus vs ascending cholangitis.  Assessment:  BCID + in 2 out of 4 bottles for E. Coli (no resistance detected) and gram + cocci in pairs (no organism identified on panel).  Name of physician (or Provider) Contacted: Gershon Cull, FNP  Current antibiotics: Vancomycin, Cefepime and Metronidazole  Changes to prescribed antibiotics recommended:  Recommendations accepted by provider - D/C Vancomycin - Change Cefepime to Ceftriaxone 2gm IV q24h - Continue Metronidazole  Results for orders placed or performed during the hospital encounter of 01/10/22  Blood Culture ID Panel (Reflexed) (Collected: 01/10/2022 12:36 PM)  Result Value Ref Range   Enterococcus faecalis NOT DETECTED NOT DETECTED   Enterococcus Faecium NOT DETECTED NOT DETECTED   Listeria monocytogenes NOT DETECTED NOT DETECTED   Staphylococcus species NOT DETECTED NOT DETECTED   Staphylococcus aureus (BCID) NOT DETECTED NOT DETECTED   Staphylococcus epidermidis NOT DETECTED NOT DETECTED   Staphylococcus lugdunensis NOT DETECTED NOT DETECTED   Streptococcus species NOT DETECTED NOT DETECTED   Streptococcus agalactiae NOT DETECTED NOT DETECTED   Streptococcus pneumoniae NOT DETECTED NOT DETECTED   Streptococcus pyogenes NOT DETECTED NOT DETECTED   A.calcoaceticus-baumannii NOT DETECTED NOT DETECTED   Bacteroides fragilis NOT DETECTED NOT DETECTED   Enterobacterales DETECTED (A) NOT DETECTED   Enterobacter cloacae complex NOT DETECTED NOT DETECTED   Escherichia coli DETECTED (A) NOT DETECTED   Klebsiella aerogenes NOT DETECTED NOT DETECTED   Klebsiella oxytoca NOT DETECTED NOT DETECTED   Klebsiella pneumoniae NOT DETECTED NOT DETECTED   Proteus species NOT DETECTED NOT DETECTED   Salmonella species NOT DETECTED NOT  DETECTED   Serratia marcescens NOT DETECTED NOT DETECTED   Haemophilus influenzae NOT DETECTED NOT DETECTED   Neisseria meningitidis NOT DETECTED NOT DETECTED   Pseudomonas aeruginosa NOT DETECTED NOT DETECTED   Stenotrophomonas maltophilia NOT DETECTED NOT DETECTED   Candida albicans NOT DETECTED NOT DETECTED   Candida auris NOT DETECTED NOT DETECTED   Candida glabrata NOT DETECTED NOT DETECTED   Candida krusei NOT DETECTED NOT DETECTED   Candida parapsilosis NOT DETECTED NOT DETECTED   Candida tropicalis NOT DETECTED NOT DETECTED   Cryptococcus neoformans/gattii NOT DETECTED NOT DETECTED   CTX-M ESBL NOT DETECTED NOT DETECTED   Carbapenem resistance IMP NOT DETECTED NOT DETECTED   Carbapenem resistance KPC NOT DETECTED NOT DETECTED   Carbapenem resistance NDM NOT DETECTED NOT DETECTED   Carbapenem resist OXA 48 LIKE NOT DETECTED NOT DETECTED   Carbapenem resistance VIM NOT DETECTED NOT DETECTED    Everette Rank, PharmD 01/11/2022  5:29 AM

## 2022-01-11 NOTE — Progress Notes (Addendum)
Progress Note     Subjective: Alert but agitated and drowsy. Still with abdominal pain this am greatest in epigastrium. No nausea.  Objective: Vital signs in last 24 hours: Temp:  [99 F (37.2 C)-101.7 F (38.7 C)] 99 F (37.2 C) (11/07 0452) Pulse Rate:  [110-140] 124 (11/07 0602) Resp:  [13-26] 19 (11/07 0602) BP: (96-151)/(47-114) 145/66 (11/07 0602) SpO2:  [90 %-100 %] 96 % (11/07 0602) Weight:  [107.3 kg-122.5 kg] 107.3 kg (11/06 1757) Last BM Date :  (PTA)  Intake/Output from previous day: 11/06 0701 - 11/07 0700 In: 4502.3 [I.V.:660.3; IV Piggyback:3842] Out: 1350 [Urine:1350] Intake/Output this shift: No intake/output data recorded.  PE: General: pleasant, WD, male who is laying in bed. Ill appearing. In posey belt Heart: regular, rate, and rhythm. Palpable radial pulses bilaterally Lungs: Respiratory effort nonlabored Abd: soft, ND, diffusely TTP but greatest in epigastrium with voluntary guarding MSK: all 4 extremities are symmetrical with no cyanosis, clubbing, or edema. Skin: warm and dry Psych: Alert. agitated   Lab Results:  Recent Labs    01/10/22 1219 01/11/22 0438  WBC 4.5 9.5  HGB 13.8 13.0  HCT 43.1 39.0  PLT 148* 136*   BMET Recent Labs    01/10/22 1850 01/11/22 0438  NA 135 136  K 3.1* 3.7  CL 105 108  CO2 23 22  GLUCOSE 139* 125*  BUN 12 13  CREATININE 1.23 1.31*  CALCIUM 8.1* 8.1*   PT/INR Recent Labs    01/10/22 1219 01/11/22 0438  LABPROT 14.0 17.0*  INR 1.1 1.4*   CMP     Component Value Date/Time   NA 136 01/11/2022 0438   K 3.7 01/11/2022 0438   CL 108 01/11/2022 0438   CO2 22 01/11/2022 0438   GLUCOSE 125 (H) 01/11/2022 0438   BUN 13 01/11/2022 0438   CREATININE 1.31 (H) 01/11/2022 0438   CREATININE 1.20 10/19/2021 1150   CREATININE 1.09 07/22/2016 1111   CALCIUM 8.1 (L) 01/11/2022 0438   PROT 5.8 (L) 01/11/2022 0438   ALBUMIN 2.9 (L) 01/11/2022 0438   AST 375 (H) 01/11/2022 0438   AST 22 10/19/2021  1150   ALT 286 (H) 01/11/2022 0438   ALT 36 10/19/2021 1150   ALKPHOS 94 01/11/2022 0438   BILITOT 4.9 (H) 01/11/2022 0438   BILITOT 0.8 10/19/2021 1150   GFRNONAA >60 01/11/2022 0438   GFRNONAA >60 10/19/2021 1150   GFRAA >60 01/16/2017 0405   Lipase     Component Value Date/Time   LIPASE 45 01/10/2022 1448       Studies/Results: US Abdomen Limited RUQ (LIVER/GB)  Result Date: 01/10/2022 CLINICAL DATA:  Right upper quadrant abdominal pain EXAM: ULTRASOUND ABDOMEN LIMITED RIGHT UPPER QUADRANT COMPARISON:  CT scan 01/10/2022 FINDINGS: Gallbladder: Cholelithiasis noted, individual stones measuring up to 1.4 cm in diameter. Sonographic Murphy's sign absent. Most of the hyperechogenicity between the gallbladder in the adjacent liver is most likely attributable to adipose tissue given comparison to the CT scan from earlier today; no definite gallbladder wall thickening is observed. Common bile duct: Diameter: 0.5 cm in diameter. The choledocholithiasis shown on the CT from earlier today is not readily apparent on ultrasound. Liver: No focal lesion identified. Coarse echogenic liver with poor sonic penetration compatible with diffuse hepatic steatosis. Portal vein is patent on color Doppler imaging with normal direction of blood flow towards the liver. Limitations include patient difficulty with breath holding or remaining stationary. IMPRESSION: 1. Cholelithiasis without definite sonographic evidence of acute cholecystitis. 2.  Choledocholithiasis with shown on the CT from 12/10/2021 but was not well seen on today's ultrasound. 3. Coarse echogenic liver with poor sonic penetration compatible with diffuse hepatic steatosis. Electronically Signed   By: Van Clines M.D.   On: 01/10/2022 17:51   DG Chest Port 1 View  Result Date: 01/10/2022 CLINICAL DATA:  Concern for sepsis. EXAM: PORTABLE CHEST 1 VIEW COMPARISON:  05/17/2021; 04/09/2021 FINDINGS: Grossly unchanged enlarged cardiac silhouette  and mediastinal contours with nodular prominence of the pulmonary hila. Mild pulmonary venous congestion without frank evidence of edema. No focal airspace opacities. No pleural effusion or pneumothorax. Stable position of support apparatus. No acute osseous abnormalities. IMPRESSION: Pulmonary venous congestion without frank evidence of edema on this AP portable examination. No discrete focal airspace opacities to suggest pneumonia. Further evaluation with a PA and lateral chest radiograph may be obtained as clinically indicated. Electronically Signed   By: Sandi Mariscal M.D.   On: 01/10/2022 13:33   CT ABDOMEN PELVIS WO CONTRAST  Result Date: 01/10/2022 CLINICAL DATA:  Abdominal pain, acute, nonlocalized. Patient reports inability to urinate. EXAM: CT ABDOMEN AND PELVIS WITHOUT CONTRAST TECHNIQUE: Multidetector CT imaging of the abdomen and pelvis was performed following the standard protocol without IV contrast. RADIATION DOSE REDUCTION: This exam was performed according to the departmental dose-optimization program which includes automated exposure control, adjustment of the mA and/or kV according to patient size and/or use of iterative reconstruction technique. COMPARISON:  10/19/2021 FINDINGS: Lower chest: Lung bases are clear.  No pleural or pericardial fluid. Hepatobiliary: Liver parenchyma is normal. Small amount of air in the biliary tree, less than seen previously. Few calcified stones dependent within the gallbladder. No sign of ductal dilatation. However, there is suspicion of a 4 mm stone at the ampulla. Pancreas: Fatty atrophy.  No acute finding. Spleen: Normal Adrenals/Urinary Tract: Right adrenal gland is normal. 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 No pronounced change. No change in appearance of the kidneys. Bilateral cysts for which specific follow-up is not suggested. No hydronephrosis. Bladder is normal. Stomach/Bowel: Stomach and small  intestine are normal. Normal appearance of the colon. Vascular/Lymphatic: Aortic atherosclerosis. No aneurysm. No abdominal lymphadenopathy. Small portal region nodes are unchanged. Reproductive: Normal Other: No free fluid or air. Musculoskeletal: No evidence of fracture or lytic destructive bone lesion. IMPRESSION: 1. Few calcified stones dependent within the gallbladder. No sign of ductal dilatation. However, there is suspicion of a 4 mm stone at the ampulla. 2. 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 No pronounced change. 3. Aortic atherosclerosis. 4. No hydronephrosis.  No evidence of bladder outlet obstruction. Aortic Atherosclerosis (ICD10-I70.0). Electronically Signed   By: Nelson Chimes M.D.   On: 01/10/2022 13:28   CT Head Wo Contrast  Result Date: 01/10/2022 CLINICAL DATA:  Mental status change.  Unknown cause. EXAM: CT HEAD WITHOUT CONTRAST TECHNIQUE: Contiguous axial images were obtained from the base of the skull through the vertex without intravenous contrast. RADIATION DOSE REDUCTION: This exam was performed according to the departmental dose-optimization program which includes automated exposure control, adjustment of the mA and/or kV according to patient size and/or use of iterative reconstruction technique. COMPARISON:  CT examination dated May 17, 2021. FINDINGS: Brain: No evidence of acute infarction, hemorrhage, hydrocephalus, extra-axial collection or mass lesion/mass effect. Patchy area of low-attenuation of the periventricular white matter presumed chronic microvascular ischemic changes. Vascular: No hyperdense vessel or unexpected calcification. Skull: Normal. Negative for fracture or  focal lesion. Sinuses/Orbits: Mucosal thickening and patchy opacification of the ethmoid air cells. Other: None. IMPRESSION: 1. No acute intracranial abnormality. 2. Chronic microvascular ischemic changes of the periventricular white matter. 3. Paranasal sinus  disease. Electronically Signed   By: Keane Police D.O.   On: 01/10/2022 13:19    Anti-infectives: Anti-infectives (From admission, onward)    Start     Dose/Rate Route Frequency Ordered Stop   01/11/22 1400  vancomycin (VANCOREADY) IVPB 1500 mg/300 mL  Status:  Discontinued        1,500 mg 150 mL/hr over 120 Minutes Intravenous Every 24 hours 01/10/22 1611 01/11/22 0533   01/11/22 1300  cefTRIAXone (ROCEPHIN) 2 g in sodium chloride 0.9 % 100 mL IVPB        2 g 200 mL/hr over 30 Minutes Intravenous Every 24 hours 01/11/22 0533     01/10/22 2200  ceFEPIme (MAXIPIME) 2 g in sodium chloride 0.9 % 100 mL IVPB  Status:  Discontinued        2 g 200 mL/hr over 30 Minutes Intravenous Every 8 hours 01/10/22 1521 01/11/22 0533   01/10/22 2200  metroNIDAZOLE (FLAGYL) IVPB 500 mg        500 mg 100 mL/hr over 60 Minutes Intravenous Every 12 hours 01/10/22 1521 01/17/22 2159   01/10/22 1230  ceFEPIme (MAXIPIME) 2 g in sodium chloride 0.9 % 100 mL IVPB        2 g 200 mL/hr over 30 Minutes Intravenous  Once 01/10/22 1221 01/10/22 1324   01/10/22 1230  metroNIDAZOLE (FLAGYL) IVPB 500 mg        500 mg 100 mL/hr over 60 Minutes Intravenous  Once 01/10/22 1221 01/10/22 1325   01/10/22 1230  vancomycin (VANCOCIN) IVPB 1000 mg/200 mL premix  Status:  Discontinued        1,000 mg 200 mL/hr over 60 Minutes Intravenous  Once 01/10/22 1221 01/10/22 1228   01/10/22 1230  vancomycin (VANCOREADY) IVPB 2000 mg/400 mL        2,000 mg 200 mL/hr over 120 Minutes Intravenous  Once 01/10/22 1228 01/10/22 1537        Assessment/Plan Cholelithiasis, choledocholithiasis, possible cholangitis   - CT 11/6 with choledocholithiasis - RUQ [place] 11/6 without cholecystitis or definitive choledocholithiasis - T bili rising - now 4.9 from 2.1. LFTs remain elevated - clinical picture is not definitve for cholecystitis but given patient remains ill could consider perc chole to eliminate as possible sepsis source however await GI  reccs regarding ERCP - continue IV abx  FEN: npo, IVF ID: [email or web address] 11/6> VTE: Lovenox  I reviewed Consultant GI notes, hospitalist notes, last 24 h vitals and pain scores, last 48 h intake and output, last 24 h labs and trends, and last 24 h imaging results.    LOS: 1 day   [facility] 01/11/2022, 7:54 AM Please see Amion for pager number during day hours 7:00am-4:30pm

## 2022-01-11 NOTE — Progress Notes (Signed)
[facility] Progress Note  CC:  Elevated LFTs, questionable stone at the ampulla   Subjective: He complains of having stomach pain, hurts all over. He received Fentanyl for pain and Ativan for agitation this morning. He is not agitated at this time. No BM since admission. Remains NPO. No family at the bedside.    Objective:  Vital signs in last 24 hours: Temp:  [99 F (37.2 C)-101.7 F (38.7 C)] 99 F (37.2 C) (11/07 0452) Pulse Rate:  [110-140] 124 (11/07 0602) Resp:  [13-26] 19 (11/07 0602) BP: (96-151)/(47-114) 145/66 (11/07 0602) SpO2:  [90 %-100 %] 96 % (11/07 0602) Weight:  [107.3 kg-122.5 kg] 107.3 kg (11/06 1757) Last BM Date :  (PTA) General: Ill-appearing 66 year old male restrained with a Posey belt. Eyes: Sclera nonicteric with erythematous injections.  Heart: Tachycardic, no murmur. Pulm: Breath sounds clear, decreased in the bases. Abdomen: Soft, mildly distended.  Generalized tenderness throughout without rebound or guarding.  Hypoactive bowel sounds to all 4 quadrants.  Midline abdominal scar intact. Extremities: Bilateral lower extremities with trace edema. Neurologic:  Alert to name.  He stated he was in the emergency room.  Moves all extremities.  Speech is slightly mumbled but comprehensible.  He follows simple commands. Psych: Nonagitated at this time.  Resting calmly in bed.  Intake/Output from previous day: 11/06 0701 - 11/07 0700 In: 4502.3 [I.V.:660.3; IV Piggyback:3842] Out: 3790 [Urine:1350] Intake/Output this shift: No intake/output data recorded.  Lab Results: Recent Labs    01/10/22 1219 01/11/22 0438  WBC 4.5 9.5  HGB 13.8 13.0  HCT 43.1 39.0  PLT 148* 136*   BMET Recent Labs    01/10/22 1219 01/10/22 1850 01/11/22 0438  NA 141 135 136  K 3.7 3.1* 3.7  CL 106 105 108  CO2 _0 GLUCOSE 139* 139* 125*  BUN _1 CREATININE 1.35* 1.23 1.31*  CALCIUM 8.6* 8.1* 8.1*   LFT Recent Labs     01/11/22 0438  PROT 5.8*  ALBUMIN 2.9*  AST 375*  ALT 286*  ALKPHOS 94  BILITOT 4.9*  BILIDIR 3.2*  IBILI 1.7*   PT/INR Recent Labs    01/10/22 1219 01/11/22 0438  LABPROT 14.0 17.0*  INR 1.1 1.4*   Hepatitis Panel Recent Labs    01/10/22 1450  HEPBSAG NON REACTIVE  HCVAB NON REACTIVE  HEPAIGM NON REACTIVE  HEPBIGM NON REACTIVE    US Abdomen Limited RUQ (LIVER/GB)  Result Date: 01/10/2022 CLINICAL DATA:  Right upper quadrant abdominal pain EXAM: ULTRASOUND ABDOMEN LIMITED RIGHT UPPER QUADRANT COMPARISON:  CT scan 01/10/2022 FINDINGS: Gallbladder: Cholelithiasis noted, individual stones measuring up to 1.4 cm in diameter. Sonographic Murphy's sign absent. Most of the hyperechogenicity between the gallbladder in the adjacent liver is most likely attributable to adipose tissue given comparison to the CT scan from earlier today; no definite gallbladder wall thickening is observed. Common bile duct: Diameter: 0.5 cm in diameter. The choledocholithiasis shown on the CT from earlier today is not readily apparent on ultrasound. Liver: No focal lesion identified. Coarse echogenic liver with poor sonic penetration compatible with diffuse hepatic steatosis. Portal vein is patent on color Doppler imaging with normal direction of blood flow towards the liver. Limitations include patient difficulty with breath holding or remaining stationary. IMPRESSION: 1. Cholelithiasis without definite sonographic evidence of acute cholecystitis. 2. Choledocholithiasis with shown on the CT from 12/10/2021 but was not well seen on today's ultrasound. 3. Coarse echogenic liver with poor sonic penetration  compatible with diffuse hepatic steatosis. Electronically Signed   By: Van Clines M.D.   On: 01/10/2022 17:51   DG Chest Port 1 View  Result Date: 01/10/2022 CLINICAL DATA:  Concern for sepsis. EXAM: PORTABLE CHEST 1 VIEW COMPARISON:  05/17/2021; 04/09/2021 FINDINGS: Grossly unchanged enlarged cardiac  silhouette and mediastinal contours with nodular prominence of the pulmonary hila. Mild pulmonary venous congestion without frank evidence of edema. No focal airspace opacities. No pleural effusion or pneumothorax. Stable position of support apparatus. No acute osseous abnormalities. IMPRESSION: Pulmonary venous congestion without frank evidence of edema on this AP portable examination. No discrete focal airspace opacities to suggest pneumonia. Further evaluation with a PA and lateral chest radiograph may be obtained as clinically indicated. Electronically Signed   By: Sandi Mariscal M.D.   On: 01/10/2022 13:33   CT ABDOMEN PELVIS WO CONTRAST  Result Date: 01/10/2022 CLINICAL DATA:  Abdominal pain, acute, nonlocalized. Patient reports inability to urinate. EXAM: CT ABDOMEN AND PELVIS WITHOUT CONTRAST TECHNIQUE: Multidetector CT imaging of the abdomen and pelvis was performed following the standard protocol without IV contrast. RADIATION DOSE REDUCTION: This exam was performed according to the departmental dose-optimization program which includes automated exposure control, adjustment of the mA and/or kV according to patient size and/or use of iterative reconstruction technique. COMPARISON:  10/19/2021 FINDINGS: Lower chest: Lung bases are clear.  No pleural or pericardial fluid. Hepatobiliary: Liver parenchyma is normal. Small amount of air in the biliary tree, less than seen previously. Few calcified stones dependent within the gallbladder. No sign of ductal dilatation. However, there is suspicion of a 4 mm stone at the ampulla. Pancreas: Fatty atrophy.  No acute finding. Spleen: Normal Adrenals/Urinary Tract: Right adrenal gland is normal. 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 No pronounced change. No change in appearance of the kidneys. Bilateral cysts for which specific follow-up is not suggested. No hydronephrosis. Bladder is normal. Stomach/Bowel: Stomach and  small intestine are normal. Normal appearance of the colon. Vascular/Lymphatic: Aortic atherosclerosis. No aneurysm. No abdominal lymphadenopathy. Small portal region nodes are unchanged. Reproductive: Normal Other: No free fluid or air. Musculoskeletal: No evidence of fracture or lytic destructive bone lesion. IMPRESSION: 1. Few calcified stones dependent within the gallbladder. No sign of ductal dilatation. However, there is suspicion of a 4 mm stone at the ampulla. 2. 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 No pronounced change. 3. Aortic atherosclerosis. 4. No hydronephrosis.  No evidence of bladder outlet obstruction. Aortic Atherosclerosis (ICD10-I70.0). Electronically Signed   By: Nelson Chimes M.D.   On: 01/10/2022 13:28   CT Head Wo Contrast  Result Date: 01/10/2022 CLINICAL DATA:  Mental status change.  Unknown cause. EXAM: CT HEAD WITHOUT CONTRAST TECHNIQUE: Contiguous axial images were obtained from the base of the skull through the vertex without intravenous contrast. RADIATION DOSE REDUCTION: This exam was performed according to the departmental dose-optimization program which includes automated exposure control, adjustment of the mA and/or kV according to patient size and/or use of iterative reconstruction technique. COMPARISON:  CT examination dated May 17, 2021. FINDINGS: Brain: No evidence of acute infarction, hemorrhage, hydrocephalus, extra-axial collection or mass lesion/mass effect. Patchy area of low-attenuation of the periventricular white matter presumed chronic microvascular ischemic changes. Vascular: No hyperdense vessel or unexpected calcification. Skull: Normal. Negative for fracture or focal lesion. Sinuses/Orbits: Mucosal thickening and patchy opacification of the ethmoid air cells. Other: None. IMPRESSION: 1. No acute intracranial abnormality. 2. Chronic microvascular  ischemic changes of the periventricular white matter. 3. Paranasal  sinus disease. Electronically Signed   By: Keane Police D.O.   On: 01/10/2022 13:19    Assessment / Plan:  48) 66 year old male with a history of choledocholithiasis status post ERCP/sphincterotomy with stone extraction 02/2021 admitted to the hospital 11/6 with abdominal pain, diarrhea and a fever of 102F.  No leukocytosis.  T. Bili 2.1 -> 4.9. Direct bili 3.2. AST 372 -> 375. ALT 221 -> 286. Alk phos 94. Albumin 2.9. Acute hepatitis panel negative.  Lipase 45.  INR 1.4. CTAP identified a few calcified stones in the gallbladder without biliary ductal dilatation with suspicion of a 4 mm stone at the ampulla. RUQ sono showed hepatic steatosis, cholelithiasis without evidence of acute cholecystitis and no choledocholithiasis.  Sepsis secondary to possible cholecystitis vs ascending cholangitis. Blood cultures grew E. Coli, on Ceftriaxone and Metronidazole IV. General surgery consulted, no recommendations for a cholecystectomy at this time, possible placement of a perc chole tube vs ERCP.  -Await further ERCP recommendations from Dr. 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   4) History of tubular adenomatous/hyperplastic polyps per colonoscopy 10/2014   5) History of GERD -Pantoprazole 40 mg IV every 24 hours  6) AKI, Cr 1.23 -> 1.31  7) Hypocalcemia -Management per the hospitalist         Principal Problem:   Sepsis due to undetermined organism [facility]) Active Problems:   CAD S/P percutaneous coronary angioplasty   Essential hypertension   Dyslipidemia, goal LDL below 70   Chronic diastolic congestive heart failure (HCC)   Non-small cell carcinoma of right lung, stage 4 (HCC)    Thrombocytopenia (HCC)   COPD (chronic obstructive pulmonary disease) ([place])   Abdominal pain   Cholelithiasis     LOS: 1 day   [name] [name]  01/11/2022, 8:39AM

## 2022-01-11 NOTE — TOC Progression Note (Signed)
Transition of Care [facility]) - Progression Note    Patient Details  Name: DOMANICK CUCCIA MRN: [id number] Date of Birth: 1955-10-08  Transition of Care [facility]) CM/SW Contact  Servando Snare, [place] Phone Number: 01/11/2022, 9:58 AM  Clinical Narrative:      Transition of Care [facility]) Screening Note   Patient Details  Name: DEAUNDRE ALLSTON Date of Birth: [birth year]   Transition of Care [facility]) CM/SW Contact:    Servando Snare, LCSW Phone Number: 01/11/2022, 9:59 AM    Transition of Care Department [facility]) has reviewed patient and no TOC needs have been identified at this time. We will continue to monitor patient advancement through interdisciplinary progression rounds. If new patient transition needs arise, please place a TOC consult.          Expected Discharge Plan and Services                                                 Social Determinants of Health (SDOH) Interventions    Readmission Risk Interventions    02/12/2021   12:15 PM  Readmission Risk Prevention Plan  Transportation Screening Complete  PCP or Specialist Appt within 3-5 Days Complete

## 2022-01-11 NOTE — Progress Notes (Addendum)
[facility] Progress Note Patient: [name] [name]:[id number] DOB: 1955-07-04 DOA: 01/10/2022  DOS: the patient was seen and examined on 01/11/2022  Brief hospital course: PMH of anxiety, depression, OA, CAD, COPD, HLD, fibromyalgia, chronic pain syndrome, GERD, migraine, HTN, obesity presented to hospital with complaints of abdominal pain and nausea and vomiting. Found to have CBD stone without ductal dilation without any evidence of cholecystitis with bacteremia for E. coli and gram-positive cocci GI and general surgery consulted.  Currently on PCA for pain control.  ERCP scheduled for 11/9. Assessment and Plan: Choledocholithiasis. History of the same requiring ERCP and in December 2022. Abdominal pain nausea and vomiting. LFTs elevated. GI and general surgery consulted. Plan is for ERCP on 11/9. Remains NPO. Continue with IV fluid. Continue with IV antibiotics. Pain control with PCA.  Sepsis secondary to E. coli bacteremia most likely secondary to intra-abdominal infection possible cholangitis. Blood cultures positive for E. coli. Also positive for grandpa cocci. Currently on IV vancomycin and ceftriaxone. We will recommend repeat cultures tomorrow. GI following ERCP scheduled on 11/9.  Stage IV non-small cell right lung carcinoma. Currently on observation. Not on hospice. Monitor.  CAD. On Brilinta. Currently on hold for intervention as well as n.p.o. status. Currently no active chest pain.  Chronic diastolic CHF. HTN At risk for volume overload. Currently adequate. Echocardiogram ordered EF normal. Diuretics on hold.  HLD. Statin on hold due to elevated LFT.  COPD. On Xopenex. No exacerbation. Monitor.  Thrombocytopenia. In setting of gram-negative sepsis.  Acute metabolic encephalopathy. Most likely in the setting of sepsis causing delirium. Patient agitated. We will use IV Ativan.  Uncontrolled pain. Chronic pain syndrome pretension  fibromyalgia. Holding patient's home regimen for now. Using pain control with Dilaudid PCA.  Subjective: Severely agitated trying to get out of the bed, trying to pull out her lines. No nausea no vomiting no fever no chills.  No BM so far.  Physical Exam: General: in severe distress;  Cardiovascular: S1 and S2 Present, no Murmur Respiratory: Normal respiratory effort, Bilateral Air entry present, no Crackles, no wheezes Abdomen: Bowel Sound present, diffusely tender Extremities: No edema Neurology: alert and oriented to self no focal deficit, severely agitated  Data Reviewed: I have Reviewed nursing notes, Vitals, and Lab results. Since last encounter, pertinent lab results CBC and CMP   . I have ordered test including CBC and CMP  . I have discussed pt's care plan and test results with GI and general surgery  . I have ordered imaging x-ray abdomen  .   Disposition: Status is: Inpatient Remains inpatient appropriate because: Need for ERCP and IV antibiotics  enoxaparin (LOVENOX) injection 40 mg Start: 01/10/22 2200 SCDs Start: 01/10/22 1523   Family Communication: Family at bedside Level of care: Stepdown maintain stepdown level of care due to severe agitation along with sepsis.  Vitals:   01/11/22 1600 01/11/22 1640 01/11/22 1900 01/11/22 2000  BP: 134/68  (!) 99/44 121/68  Pulse: (!) 101  (!) 105 (!) 106  Resp: 20  (!) 25 18  Temp:  98.6 F (37 C)    TempSrc:  Oral    SpO2: 100%  94% 98%  Weight:      Height:       The patient is critically ill with multiple organ systems failure and requires high complexity decision making for assessment and support, frequent evaluation and titration of therapies. Critical Care Time devoted to patient care services described in this note is 35 minutes  Author: Berle Mull, MD 01/11/2022 8:44 PM  Please look on [email or web address] to find out who is on call.

## 2022-01-11 NOTE — Progress Notes (Signed)
  Echocardiogram 2D Echocardiogram has been performed.  [name] [name] 01/11/2022, 12:09 PM

## 2022-01-11 NOTE — Progress Notes (Signed)
Pharmacy Antibiotic Note  [name] [name] is a 66 y.o. male with metastatic lung cancer who presented to the ED on 01/10/2022 with c/o fever, n/v and abdominal pain. LFTs were found to be elevated. Abd CT and US showed gallstones and choledocholithiasis without biliary dilation.  He was started on vancomycin, cefepime and flagyl on admission for sepsis. Blood cultures collected on 01/10/22 resulted back on 11/7 AM with GPC in pairs and GNR (BCID= Ecoli).  Over night coverage for [facility] d/ced vancomycin and changed cefepime to ceftriaxone.  Dr. Posey Pronto recom to resume vancomycin back on 01/11/22.  Today, 01/11/2022: - day #2 abx - Tmax 101.7 - scr 1.31 (crcl~67)  Plan: - vancomycin 1250 mg IV q24h for est AUC 463 - ceftriaxone 2gm q24h - flagyl d/ced by MD  __________________________________________  Height: 5\' 9"  (175.3 cm) Weight: 107.3 kg (236 lb 8.9 oz) IBW/kg (Calculated) : 70.7  Temp (24hrs), Avg:100.2 F (37.9 C), Min:99 F (37.2 C), Max:101.7 F (38.7 C)  Recent Labs  Lab 01/10/22 1218 01/10/22 1219 01/10/22 1448 01/10/22 1850 01/11/22 0438  WBC  --  4.5  --   --  9.5  CREATININE  --  1.35*  --  1.23 1.31*  LATICACIDVEN 2.9*  --  2.4* 2.0*  --     Estimated Creatinine Clearance: 66.9 mL/min (A) (by C-G formula based on SCr of 1.31 mg/dL (H)).    Allergies  Allergen Reactions   Fish Allergy Nausea And Vomiting   Iohexol Other (See Comments)     Code: HIVES, Desc: PT STATES HE HAD AN IVP 15 YRS AGO AND HAD SOB AND BROKE OUT IN HIVES., Onset Date: [id number]    Ivp Dye [Iodinated Contrast Media] Other (See Comments)    intolerance      Thank you for allowing pharmacy to be a part of this patient's care.  Lynelle Doctor 01/11/2022 9:09 AM

## 2022-01-11 NOTE — Hospital Course (Addendum)
66 y.o. male with a history of stage IV non-small cell lung cancer, coronary artery disease, diastolic congestive heart failure, depression, anxiety, chronic pain, osteoarthritis, COPD, hyperlipidemia, fibromyalgia, GERD, migraine, hypertension, obesity.  Patient has a pertinent additional history of choledocholithiasis with a history of prior ERCP and sphincterotomy 02/2021.    Patient presented secondary to abdominal pain, nausea and vomiting with evidence of a CBD stone.   Patient met sepsis criteria on admission and was found to have an E. Coli and Enterococcus casseliflavus bacteremia.  Dr. Wallace with infectious disease was consulted.  Patient's Port-A-Cath was removed on 11/10.  TEE was performed and was negative.  Intravenous antibiotics were initiated initially with intravenous ceftriaxone and vancomycin with regimen adjusted to Unasyn monotherapy due to concerns for cholangitis.    Gastroenterology was additionally consulted and patient underwent another ERCP on 11/9 with sphincterotomy and CBD stone extraction.  Due to this being the patient's second ERCP with stone extraction Dr. Blackman with general surgery was consulted patient undergoing successful laparoscopic cholecystectomy on 11/15 without postoperative complication.  Per infectious disease recommendations, patient was continued to be treated with intravenous antibiotic therapy through 11/18 followed by oral high-dose amoxicillin to complete a 2-week course of treatment since Port-A-Cath removal.  In the days that followed patient continued to clinically improve postoperatively.  Patient continued to receive intravenous antibiotics and was transitioned to high-dose oral amoxicillin on 11/18.  Due to patient's prolonged hospitalization patient was found to exhibit generalized weakness due to progressive deconditioning.  Patient was evaluated by physical therapy and arrangements were made for patient to have home health physical therapy  services at time of discharge.  Patient will additionally be ordered home health nursing and hospice services at that time due to patient's stage IV lung cancer, coronary artery disease, congestive heart failure and prolonged hospital course.  Patient was discharged in improved and stable condition on 01/22/2022.   

## 2022-01-12 DIAGNOSIS — C3491 Malignant neoplasm of unspecified part of right bronchus or lung: Secondary | ICD-10-CM

## 2022-01-12 DIAGNOSIS — I2510 Atherosclerotic heart disease of native coronary artery without angina pectoris: Secondary | ICD-10-CM

## 2022-01-12 DIAGNOSIS — G9341 Metabolic encephalopathy: Secondary | ICD-10-CM

## 2022-01-12 DIAGNOSIS — D696 Thrombocytopenia, unspecified: Secondary | ICD-10-CM

## 2022-01-12 DIAGNOSIS — R6520 Severe sepsis without septic shock: Secondary | ICD-10-CM

## 2022-01-12 DIAGNOSIS — R748 Abnormal levels of other serum enzymes: Secondary | ICD-10-CM | POA: Diagnosis not present

## 2022-01-12 DIAGNOSIS — R1010 Upper abdominal pain, unspecified: Secondary | ICD-10-CM | POA: Diagnosis not present

## 2022-01-12 DIAGNOSIS — K8050 Calculus of bile duct without cholangitis or cholecystitis without obstruction: Secondary | ICD-10-CM

## 2022-01-12 DIAGNOSIS — A4151 Sepsis due to Escherichia coli [E. coli]: Secondary | ICD-10-CM | POA: Diagnosis not present

## 2022-01-12 DIAGNOSIS — I10 Essential (primary) hypertension: Secondary | ICD-10-CM

## 2022-01-12 DIAGNOSIS — A419 Sepsis, unspecified organism: Secondary | ICD-10-CM | POA: Diagnosis not present

## 2022-01-12 DIAGNOSIS — Z9861 Coronary angioplasty status: Secondary | ICD-10-CM

## 2022-01-12 LAB — CBC WITH DIFFERENTIAL/PLATELET
Abs Immature Granulocytes: 0.05 10*3/uL (ref 0.00–0.07)
Basophils Absolute: 0 10*3/uL (ref 0.0–0.1)
Basophils Relative: 0 %
Eosinophils Absolute: 0.1 10*3/uL (ref 0.0–0.5)
Eosinophils Relative: 1 %
HCT: 34.4 % — ABNORMAL LOW (ref 39.0–52.0)
Hemoglobin: 11.3 g/dL — ABNORMAL LOW (ref 13.0–17.0)
Immature Granulocytes: 0 %
Lymphocytes Relative: 5 %
Lymphs Abs: 0.6 10*3/uL — ABNORMAL LOW (ref 0.7–4.0)
MCH: 31.8 pg (ref 26.0–34.0)
MCHC: 32.8 g/dL (ref 30.0–36.0)
MCV: 96.9 fL (ref 80.0–100.0)
Monocytes Absolute: 0.7 10*3/uL (ref 0.1–1.0)
Monocytes Relative: 6 %
Neutro Abs: 10.6 10*3/uL — ABNORMAL HIGH (ref 1.7–7.7)
Neutrophils Relative %: 88 %
Platelets: 102 10*3/uL — ABNORMAL LOW (ref 150–400)
RBC: 3.55 MIL/uL — ABNORMAL LOW (ref 4.22–5.81)
RDW: 13.9 % (ref 11.5–15.5)
WBC: 12.1 10*3/uL — ABNORMAL HIGH (ref 4.0–10.5)
nRBC: 0 % (ref 0.0–0.2)

## 2022-01-12 LAB — BASIC METABOLIC PANEL
Anion gap: 5 (ref 5–15)
BUN: 17 mg/dL (ref 8–23)
CO2: 25 mmol/L (ref 22–32)
Calcium: 7.9 mg/dL — ABNORMAL LOW (ref 8.9–10.3)
Chloride: 109 mmol/L (ref 98–111)
Creatinine, Ser: 1.17 mg/dL (ref 0.61–1.24)
GFR, Estimated: >60 mL/min (ref >60)
Glucose, Bld: 110 mg/dL — ABNORMAL HIGH (ref 70–99)
Potassium: 3.5 mmol/L (ref 3.5–5.1)
Sodium: 139 mmol/L (ref 135–145)

## 2022-01-12 LAB — PROTIME-INR
INR: 1.3 — ABNORMAL HIGH (ref 0.8–1.2)
Prothrombin Time: 16.5 seconds — ABNORMAL HIGH (ref 11.4–15.2)

## 2022-01-12 LAB — HEPATIC FUNCTION PANEL
ALT: 169 U/L — ABNORMAL HIGH (ref 0–44)
AST: 156 U/L — ABNORMAL HIGH (ref 15–41)
Albumin: 2.5 g/dL — ABNORMAL LOW (ref 3.5–5.0)
Alkaline Phosphatase: 76 U/L (ref 38–126)
Bilirubin, Direct: 2.1 mg/dL — ABNORMAL HIGH (ref 0.0–0.2)
Indirect Bilirubin: 1.1 mg/dL — ABNORMAL HIGH (ref 0.3–0.9)
Total Bilirubin: 3.2 mg/dL — ABNORMAL HIGH (ref 0.3–1.2)
Total Protein: 5.3 g/dL — ABNORMAL LOW (ref 6.5–8.1)

## 2022-01-12 LAB — MAGNESIUM: Magnesium: 1.7 mg/dL (ref 1.7–2.4)

## 2022-01-12 MED ORDER — MORPHINE SULFATE ER 15 MG PO TBCR
60.0000 mg | EXTENDED_RELEASE_TABLET | Freq: Two times a day (BID) | ORAL | Status: DC
  Administered 2022-01-12 – 2022-01-22 (×19): 60 mg via ORAL
  Filled 2022-01-12: qty 2
  Filled 2022-01-12 (×4): qty 4
  Filled 2022-01-12: qty 2
  Filled 2022-01-12 (×2): qty 4
  Filled 2022-01-12: qty 2
  Filled 2022-01-12: qty 4
  Filled 2022-01-12: qty 2
  Filled 2022-01-12: qty 4
  Filled 2022-01-12 (×2): qty 2
  Filled 2022-01-12 (×5): qty 4

## 2022-01-12 MED ORDER — PIPERACILLIN-TAZOBACTAM 3.375 G IVPB
3.3750 g | Freq: Three times a day (TID) | INTRAVENOUS | Status: DC
  Administered 2022-01-12 – 2022-01-13 (×3): 3.375 g via INTRAVENOUS
  Filled 2022-01-12 (×3): qty 50

## 2022-01-12 MED ORDER — MORPHINE SULFATE ER 30 MG PO TBCR: 60.0000 mg | EXTENDED_RELEASE_TABLET | Freq: Two times a day (BID) | ORAL | Status: DC

## 2022-01-12 MED ORDER — HYDROMORPHONE HCL 1 MG/ML IJ SOLN
0.5000 mg | INTRAMUSCULAR | Status: AC | PRN
  Administered 2022-01-12 – 2022-01-13 (×2): 0.5 mg via INTRAVENOUS
  Filled 2022-01-12 (×2): qty 1

## 2022-01-12 NOTE — Progress Notes (Signed)
PROGRESS NOTE    [name] [name]  ZOX:[id number] DOB: 17-Apr-1955 DOA: 01/10/2022 PCP: [name], MD   Brief Narrative: [name] [name] is a 66 y.o. male with a history of depression, anxiety, chronic pain, osteoarthritis, COPD, hyperlipidemia, fibromyalgia, GERD, migraine, hypertension, obesity. Patient presented secondary to abdominal pain, nausea and vomiting with evidence of a CBD stone. Patient met sepsis criteria on admission and was found to have an E. Coli and Enterococcus casseliflavus bacteremia. Antibiotics tailored to cover bacteremia. GI plan for ERCP.   Assessment and Plan:  Choledocholithiasis Elevated AST/ALT with CT imaging significant for suspicion of a 4 mm stone at the ampulla. GI and general surgery consulted. GI plan for ERCP on 11/9.  Sepsis Present on admission. Secondary to E. Coli and enterococcus casseliflavus bacteremia likely related to above source. Antibiotics as mentioned below.  E. Coli bacteremia Enterococcus casseliflavus bacteremia Patient empirically managed on Vancomycin, Flagyl and Cefepime. He was transitioned to Ceftriaxone and Vancomycin for E. Coli and GPCs noted on culture data and now transitioned to Zosyn with final identification of bacteria. ID consulted. -Continue Zosyn -Follow-up ID recommendations  Acute metabolic encephalopathy Presumed secondary to sepsis. Improving.  Chronic pain Patient's home regimen held on admission and patient started on a dilaudid PCA pump. -Discontinue PCA -Resume home analgesic therapy  Stage IV non-small cell right lung cancer Noted. Patient under observation.  CAD History of PCI of RCA in 2018. Currently on Brilinta which is held secondary to plan for ERCP.  Chronic diastolic heart failure Noted. Currently without acute heart failure. Home Lasix held. -Discontinue IV fluids  Primary hypertension Patient appears to be managed on diuretic monotherapy. Lasix held secondary to  sepsis.  Hyperlipidemia Patient is on Crestor as an outpatient which was held on admission. Elevated AST/ALT secondary to choledocholithiasis -Continue to hold Crestor for now  COPD -Continue Xopenex PRN  Thrombocytopenia Downtrending. Likely secondary to sepsis. -Trend CBC  DVT prophylaxis: Lovenox Code Status:   Code Status: Full Code Family Communication: Sister on telephone Disposition Plan: Discharge home likely in 2-5 days pending improvement of mental status, transition to outpatient antibiotic regimen, specialist recommendations   Consultants:  General surgery Gastroenterology Infectious disease  Procedures:  None  Antimicrobials: Vancomycin Cefepime Flagyl Ceftriaxone Zosyn    Subjective: Patient reports abdominal pain. States it is better than on admission. No other concerns this morning.  Objective: BP (!) 121/52   Pulse 90   Temp (!) 97.2 F (36.2 C) (Axillary)   Resp 16   Ht _0  (1.753 m)   Wt 107.3 kg   SpO2 98%   BMI 34.93 kg/m   Examination:  General exam: Appears calm and comfortable Respiratory system: Clear to auscultation. Respiratory effort normal. Cardiovascular system: S1 & S2 heard, RRR. No murmurs, rubs, gallops or clicks. Gastrointestinal system: Abdomen is distended, soft and nontender. No organomegaly or masses felt. Normal bowel sounds heard. Central nervous system: Alert and oriented to person and place. Musculoskeletal: No edema. No calf tenderness Skin: No cyanosis. No rashes Psychiatry: Judgement and insight appear normal. Mood & affect appropriate.    Data Reviewed: I have personally reviewed following labs and imaging studies  CBC Lab Results  Component Value Date   WBC 12.1 (H) 01/12/2022   RBC 3.55 (L) 01/12/2022   HGB 11.3 (L) 01/12/2022   HCT 34.4 (L) 01/12/2022   MCV 96.9 01/12/2022   MCH 31.8 01/12/2022   PLT 102 (L) 01/12/2022   MCHC 32.8 01/12/2022   RDW 13.9  01/12/2022   LYMPHSABS 0.6 (L)  01/12/2022   MONOABS 0.7 01/12/2022   EOSABS 0.1 01/12/2022   BASOSABS 0.0 25/85/2778     Last metabolic panel Lab Results  Component Value Date   NA 139 01/12/2022   K 3.5 01/12/2022   CL 109 01/12/2022   CO2 25 01/12/2022   BUN 17 01/12/2022   CREATININE 1.17 01/12/2022   GLUCOSE 110 (H) 01/12/2022   GFRNONAA >60 01/12/2022   GFRAA >60 01/16/2017   CALCIUM 7.9 (L) 01/12/2022   PHOS 3.8 02/08/2021   PROT 5.3 (L) 01/12/2022   ALBUMIN 2.5 (L) 01/12/2022   BILITOT 3.2 (H) 01/12/2022   ALKPHOS 76 01/12/2022   AST 156 (H) 01/12/2022   ALT 169 (H) 01/12/2022   ANIONGAP 5 01/12/2022    GFR: Estimated Creatinine Clearance: 74.9 mL/min (by C-G formula based on SCr of 1.17 mg/dL).  Recent Results (from the past 240 hour(s))  Blood Culture (routine x 2)     Status: None (Preliminary result)   Collection Time: 01/10/22 12:18 PM   Specimen: BLOOD  Result Value Ref Range Status   Specimen Description   Final    BLOOD LEFT ANTECUBITAL Performed at [facility] [address], [place], [place] [address]    Special Requests   Final    BOTTLES DRAWN AEROBIC AND ANAEROBIC Blood Culture adequate volume Performed at [facility] [address], [place], [place] [address]    Culture  Setup Time   Final    GRAM POSITIVE COCCI IN PAIRS IN BOTH AEROBIC AND ANAEROBIC BOTTLES CRITICAL VALUE NOTED.  VALUE IS CONSISTENT WITH PREVIOUSLY REPORTED AND CALLED VALUE.    Culture   Final    CULTURE REINCUBATED FOR BETTER GROWTH Performed at [facility], [place] [address], [place], [place] [address]    Report Status PENDING  Incomplete  Blood Culture (routine x 2)     Status: Abnormal (Preliminary result)   Collection Time: 01/10/22 12:36 PM   Specimen: BLOOD  Result Value Ref Range Status   Specimen Description   Final    BLOOD PORTA CATH Performed at [facility] [address] [place], [place] [address]    Special  Requests   Final    BOTTLES DRAWN AEROBIC AND ANAEROBIC Blood Culture adequate volume Performed at [facility] [address] [place],  [address]    Culture  Setup Time   Final    GRAM POSITIVE COCCI IN PAIRS GRAM NEGATIVE RODS IN BOTH AEROBIC AND ANAEROBIC BOTTLES CRITICAL RESULT CALLED TO, READ BACK BY AND VERIFIED WITH: PHARMD D. POINDEXTER 01/11/22 _0  BY AB    Culture (A)  Final    ESCHERICHIA COLI ENTEROCOCCUS FAECALIS CULTURE REINCUBATED FOR BETTER GROWTH Performed at [facility], [place] [address], [place],  [address]    Report Status PENDING  Incomplete  Blood Culture ID Panel (Reflexed)     Status: Abnormal   Collection Time: 01/10/22 12:36 PM  Result Value Ref Range Status   Enterococcus faecalis NOT DETECTED NOT DETECTED Final   Enterococcus Faecium NOT DETECTED NOT DETECTED Final   Listeria monocytogenes NOT DETECTED NOT DETECTED Final   Staphylococcus species NOT DETECTED NOT DETECTED Final   Staphylococcus aureus (BCID) NOT DETECTED NOT DETECTED Final   Staphylococcus epidermidis NOT DETECTED NOT DETECTED Final   Staphylococcus lugdunensis NOT DETECTED NOT DETECTED Final   Streptococcus species NOT DETECTED NOT DETECTED Final   Streptococcus agalactiae NOT  DETECTED NOT DETECTED Final   Streptococcus pneumoniae NOT DETECTED NOT DETECTED Final   Streptococcus pyogenes NOT DETECTED NOT DETECTED Final   A.calcoaceticus-baumannii NOT DETECTED NOT DETECTED Final   Bacteroides fragilis NOT DETECTED NOT DETECTED Final   Enterobacterales DETECTED (A) NOT DETECTED Final    Comment: Enterobacterales represent a large order of gram negative bacteria, not a single organism. CRITICAL RESULT CALLED TO, READ BACK BY AND VERIFIED WITH: PHARMD D. POINDEXTER 01/11/22 _0  BY AB    Enterobacter cloacae complex NOT DETECTED NOT DETECTED Final   Escherichia coli DETECTED (A) NOT DETECTED Final    Comment: CRITICAL RESULT CALLED TO, READ BACK  BY AND VERIFIED WITH: PHARMD D. POINDEXTER 01/11/22 _1  BY AB    Klebsiella aerogenes NOT DETECTED NOT DETECTED Final   Klebsiella oxytoca NOT DETECTED NOT DETECTED Final   Klebsiella pneumoniae NOT DETECTED NOT DETECTED Final   Proteus species NOT DETECTED NOT DETECTED Final   Salmonella species NOT DETECTED NOT DETECTED Final   Serratia marcescens NOT DETECTED NOT DETECTED Final   Haemophilus influenzae NOT DETECTED NOT DETECTED Final   Neisseria meningitidis NOT DETECTED NOT DETECTED Final   Pseudomonas aeruginosa NOT DETECTED NOT DETECTED Final   Stenotrophomonas maltophilia NOT DETECTED NOT DETECTED Final   Candida albicans NOT DETECTED NOT DETECTED Final   Candida auris NOT DETECTED NOT DETECTED Final   Candida glabrata NOT DETECTED NOT DETECTED Final   Candida krusei NOT DETECTED NOT DETECTED Final   Candida parapsilosis NOT DETECTED NOT DETECTED Final   Candida tropicalis NOT DETECTED NOT DETECTED Final   Cryptococcus neoformans/gattii NOT DETECTED NOT DETECTED Final   CTX-M ESBL NOT DETECTED NOT DETECTED Final   Carbapenem resistance IMP NOT DETECTED NOT DETECTED Final   Carbapenem resistance KPC NOT DETECTED NOT DETECTED Final   Carbapenem resistance NDM NOT DETECTED NOT DETECTED Final   Carbapenem resist OXA 48 LIKE NOT DETECTED NOT DETECTED Final   Carbapenem resistance VIM NOT DETECTED NOT DETECTED Final    Comment: Performed at [facility], [address] [place],  [address]  Resp Panel by RT-PCR (Flu A&B, Covid)     Status: None   Collection Time: 01/10/22  1:40 PM   Specimen: Nasal Swab  Result Value Ref Range Status   SARS Coronavirus 2 by RT PCR NEGATIVE NEGATIVE Final    Comment: (NOTE) SARS-CoV-2 target nucleic acids are NOT DETECTED.  The SARS-CoV-2 RNA is generally detectable in upper respiratory specimens during the acute phase of infection. The lowest concentration of SARS-CoV-2 viral copies this assay can detect is 138 copies/mL. A  negative result does not preclude SARS-Cov-2 infection and should not be used as the sole basis for treatment or other patient management decisions. A negative result may occur with  improper specimen collection/handling, submission of specimen other than nasopharyngeal swab, presence of viral mutation(s) within the areas targeted by this assay, and inadequate number of viral copies(<138 copies/mL). A negative result must be combined with clinical observations, patient history, and epidemiological information. The expected result is Negative.  Fact Sheet for Patients:  EntrepreneurPulse.com.au  Fact Sheet for Healthcare Providers:  IncredibleEmployment.be  This test is no t yet approved or cleared by the [place] FDA and  has been authorized for detection and/or diagnosis of SARS-CoV-2 by FDA under an Emergency Use Authorization (EUA). This EUA will remain  in effect (meaning this test can be used) for the duration of the COVID-19 declaration under Section 564(b)(1) of the Act, 21 U.S.C.section 360bbb-3(b)(1), unless  the authorization is terminated  or revoked sooner.       Influenza A by PCR NEGATIVE NEGATIVE Final   Influenza B by PCR NEGATIVE NEGATIVE Final    Comment: (NOTE) The Xpert Xpress SARS-CoV-2/FLU/RSV plus assay is intended as an aid in the diagnosis of influenza from Nasopharyngeal swab specimens and should not be used as a sole basis for treatment. Nasal washings and aspirates are unacceptable for Xpert Xpress SARS-CoV-2/FLU/RSV testing.  Fact Sheet for Patients: EntrepreneurPulse.com.au  Fact Sheet for Healthcare Providers: IncredibleEmployment.be  This test is not yet approved or cleared by the [place] FDA and has been authorized for detection and/or diagnosis of SARS-CoV-2 by FDA under an Emergency Use Authorization (EUA). This EUA will remain in effect (meaning this test can  be used) for the duration of the COVID-19 declaration under Section 564(b)(1) of the Act, 21 U.S.C. section 360bbb-3(b)(1), unless the authorization is terminated or revoked.  Performed at [facility], [place] [address], [place], [place] [address]   Urine Culture     Status: Abnormal (Preliminary result)   Collection Time: 01/10/22  1:40 PM   Specimen: In/Out Cath Urine  Result Value Ref Range Status   Specimen Description   Final    IN/OUT CATH URINE Performed at [facility] [address] [place], [place] [address]    Special Requests   Final    NONE Performed at [facility], [place] [address], [place], [place] [address]    Culture (A)  Final    1,000 COLONIES/mL ENTEROCOCCUS FAECALIS SUSCEPTIBILITIES TO FOLLOW Performed at [facility], [place] [address], [place], [place] [address]    Report Status PENDING  Incomplete  MRSA Next Gen by PCR, Nasal     Status: None   Collection Time: 01/10/22  6:03 PM   Specimen: Nasal Mucosa; Nasal Swab  Result Value Ref Range Status   MRSA by PCR Next Gen NOT DETECTED NOT DETECTED Final    Comment: (NOTE) The GeneXpert MRSA Assay (FDA approved for NASAL specimens only), is one component of a comprehensive MRSA colonization surveillance program. It is not intended to diagnose MRSA infection nor to guide or monitor treatment for MRSA infections. Test performance is not FDA approved in patients less than 77 years old. Performed at [facility], [place] [address] [place], [place] [address]       Radiology Studies: ECHOCARDIOGRAM COMPLETE  Result Date: 01/11/2022    ECHOCARDIOGRAM REPORT   Patient Name:   [name] [name] Date of Exam: 01/11/2022 Medical Rec #:  [id number]      Height:       69.0 in Accession #:    [id number]     Weight:       236.6 lb Date of Birth:  05/13/1955      BSA:          2.219 m Patient Age:    71 years       BP:           118/64 mmHg Patient  Gender: M              HR:           102 bpm. Exam Location:  Inpatient Procedure: 2D Echo, Cardiac Doppler, Color Doppler and Intracardiac            Opacification Agent Indications:    I25.110 Atherosclerotic heart disease of native coronary artery  with unstable angina pectoris; I50.40* Unspecified combined                 systolic (congestive) and diastolic (congestive) heart failure  History:        Patient has prior history of Echocardiogram examinations, most                 recent 07/22/2019. CAD and Previous Myocardial Infarction,                 Signs/Symptoms:Dyspnea and Bacteremia; Risk Factors:Hypertension                 and Current Smoker. Chemo. Lung cancer.  Sonographer:    Roseanna Rainbow RDCS Referring Phys: [id number] [place]  Sonographer Comments: Technically difficult study due to poor echo windows, suboptimal apical window, suboptimal parasternal window, Technically challenging study due to limited acoustic windows, suboptimal subcostal window and patient is obese. Image acquisition challenging due to patient body habitus and Image acquisition challenging due to respiratory motion. Extremely difficult due to respiratory motion. Could not turn patient due to extreme pain and restraint belt. IMPRESSIONS  1. Left ventricular ejection fraction, by estimation, is 60 to 65%. The left ventricle has normal function. The left ventricle has no regional wall motion abnormalities. Left ventricular diastolic parameters are indeterminate.  2. Right ventricular systolic function is normal. The right ventricular size is normal. Tricuspid regurgitation signal is inadequate for assessing PA pressure.  3. Left atrial size was mildly dilated.  4. Right atrial size was mildly dilated.  5. The mitral valve is grossly normal. No evidence of mitral valve regurgitation.  6. The aortic valve was not well visualized. Aortic valve regurgitation is not visualized. No aortic stenosis is present.  7. The  inferior vena cava is normal in size with greater than 50% respiratory variability, suggesting right atrial pressure of 3 mmHg. Comparison(s): There is prominent respiration related variation in mitral inflow velocities and ventricular septal displacement (but no other findings to support constrictive or tamponade physiology). Consider increased work of breathing due to asthma/COPD exacerbation. FINDINGS  Left Ventricle: Left ventricular ejection fraction, by estimation, is 60 to 65%. The left ventricle has normal function. The left ventricle has no regional wall motion abnormalities. Definity contrast agent was given IV to delineate the left ventricular  endocardial borders. The left ventricular internal cavity size was normal in size. There is borderline concentric left ventricular hypertrophy. Left ventricular diastolic parameters are indeterminate. Right Ventricle: The right ventricular size is normal. No increase in right ventricular wall thickness. Right ventricular systolic function is normal. Tricuspid regurgitation signal is inadequate for assessing PA pressure. Left Atrium: Left atrial size was mildly dilated. Right Atrium: Right atrial size was mildly dilated. Pericardium: The pericardium was not well visualized. Presence of epicardial fat layer. Mitral Valve: The mitral valve is grossly normal. No evidence of mitral valve regurgitation. Tricuspid Valve: The tricuspid valve is not well visualized. Tricuspid valve regurgitation is not demonstrated. Aortic Valve: The aortic valve was not well visualized. Aortic valve regurgitation is not visualized. No aortic stenosis is present. Pulmonic Valve: The pulmonic valve was not well visualized. Pulmonic valve regurgitation is not visualized. Aorta: The ascending aorta was not well visualized. Venous: The inferior vena cava is normal in size with greater than 50% respiratory variability, suggesting right atrial pressure of 3 mmHg. IAS/Shunts: No atrial level shunt  detected by color flow Doppler.  LEFT VENTRICLE PLAX 2D LVIDd:  5.60 cm     Diastology LVIDs:         3.70 cm     LV e' medial:    8.27 cm/s LV PW:         1.20 cm     LV E/e' medial:  8.9 LV IVS:        1.10 cm     LV e' lateral:   7.40 cm/s LVOT diam:     2.20 cm     LV E/e' lateral: 10.0 LV SV:         32 LV SV Index:   14 LVOT Area:     3.80 cm  LV Volumes (MOD) LV vol d, MOD A2C: 53.6 ml LV vol d, MOD A4C: 69.9 ml LV vol s, MOD A2C: 20.2 ml LV vol s, MOD A4C: 27.0 ml LV SV MOD A2C:     33.4 ml LV SV MOD A4C:     69.9 ml LV SV MOD BP:      40.7 ml RIGHT VENTRICLE            IVC RV S prime:     6.96 cm/s  IVC diam: 2.10 cm TAPSE (M-mode): 1.9 cm LEFT ATRIUM           Index        RIGHT ATRIUM           Index LA diam:      3.90 cm 1.76 cm/m   RA Area:     20.30 cm LA Vol (A2C): 11.3 ml 5.09 ml/m   RA Volume:   60.70 ml  27.36 ml/m LA Vol (A4C): 49.4 ml 22.26 ml/m  AORTIC VALVE LVOT Vmax:   60.30 cm/s LVOT Vmean:  37.900 cm/s LVOT VTI:    0.084 m  AORTA Ao Root diam: 3.70 cm Ao Asc diam:  3.60 cm MITRAL VALVE MV Area (PHT): 4.69 cm    SHUNTS MV Decel Time: 162 msec    Systemic VTI:  0.08 m MV E velocity: 73.87 cm/s  Systemic Diam: 2.20 cm MV A velocity: 79.13 cm/s MV E/A ratio:  0.93 Mihai Croitoru MD Electronically signed by Sanda Klein MD Signature Date/Time: 01/11/2022/1:45:55 PM    Final    DG Abd Portable 1V  Result Date: 01/11/2022 CLINICAL DATA:  Abdominal distension and pain. EXAM: PORTABLE ABDOMEN - 1 VIEW COMPARISON:  01/10/2022 FINDINGS: Exam detail is diminished secondary to body habitus. There is been mild increase in caliber of the small bowel loops within the right lower quadrant of the abdomen measuring up to 2.9 cm. Decreased colonic gas compared with the prior exam. IMPRESSION: Mild increase in caliber of the small bowel loops within the right lower quadrant of the abdomen with decrease colonic gas. If there is a clinical concern for developing bowel obstruction advise  follow-up serial abdominal imaging to assess change in small bowel caliber. Electronically Signed   By: Kerby Moors M.D.   On: 01/11/2022 13:17   US Abdomen Limited RUQ (LIVER/GB)  Result Date: 01/10/2022 CLINICAL DATA:  Right upper quadrant abdominal pain EXAM: ULTRASOUND ABDOMEN LIMITED RIGHT UPPER QUADRANT COMPARISON:  CT scan 01/10/2022 FINDINGS: Gallbladder: Cholelithiasis noted, individual stones measuring up to 1.4 cm in diameter. Sonographic Murphy's sign absent. Most of the hyperechogenicity between the gallbladder in the adjacent liver is most likely attributable to adipose tissue given comparison to the CT scan from earlier today; no definite gallbladder wall thickening is observed. Common bile duct: Diameter: 0.5 cm in  diameter. The choledocholithiasis shown on the CT from earlier today is not readily apparent on ultrasound. Liver: No focal lesion identified. Coarse echogenic liver with poor sonic penetration compatible with diffuse hepatic steatosis. Portal vein is patent on color Doppler imaging with normal direction of blood flow towards the liver. Limitations include patient difficulty with breath holding or remaining stationary. IMPRESSION: 1. Cholelithiasis without definite sonographic evidence of acute cholecystitis. 2. Choledocholithiasis with shown on the CT from 12/10/2021 but was not well seen on today's ultrasound. 3. Coarse echogenic liver with poor sonic penetration compatible with diffuse hepatic steatosis. Electronically Signed   By: Van Clines M.D.   On: 01/10/2022 17:51   DG Chest Port 1 View  Result Date: 01/10/2022 CLINICAL DATA:  Concern for sepsis. EXAM: PORTABLE CHEST 1 VIEW COMPARISON:  05/17/2021; 04/09/2021 FINDINGS: Grossly unchanged enlarged cardiac silhouette and mediastinal contours with nodular prominence of the pulmonary hila. Mild pulmonary venous congestion without frank evidence of edema. No focal airspace opacities. No pleural effusion or  pneumothorax. Stable position of support apparatus. No acute osseous abnormalities. IMPRESSION: Pulmonary venous congestion without frank evidence of edema on this AP portable examination. No discrete focal airspace opacities to suggest pneumonia. Further evaluation with a PA and lateral chest radiograph may be obtained as clinically indicated. Electronically Signed   By: Sandi Mariscal M.D.   On: 01/10/2022 13:33   CT ABDOMEN PELVIS WO CONTRAST  Result Date: 01/10/2022 CLINICAL DATA:  Abdominal pain, acute, nonlocalized. Patient reports inability to urinate. EXAM: CT ABDOMEN AND PELVIS WITHOUT CONTRAST TECHNIQUE: Multidetector CT imaging of the abdomen and pelvis was performed following the standard protocol without IV contrast. RADIATION DOSE REDUCTION: This exam was performed according to the departmental dose-optimization program which includes automated exposure control, adjustment of the mA and/or kV according to patient size and/or use of iterative reconstruction technique. COMPARISON:  10/19/2021 FINDINGS: Lower chest: Lung bases are clear.  No pleural or pericardial fluid. Hepatobiliary: Liver parenchyma is normal. Small amount of air in the biliary tree, less than seen previously. Few calcified stones dependent within the gallbladder. No sign of ductal dilatation. However, there is suspicion of a 4 mm stone at the ampulla. Pancreas: Fatty atrophy.  No acute finding. Spleen: Normal Adrenals/Urinary Tract: Right adrenal gland is normal. 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 No pronounced change. No change in appearance of the kidneys. Bilateral cysts for which specific follow-up is not suggested. No hydronephrosis. Bladder is normal. Stomach/Bowel: Stomach and small intestine are normal. Normal appearance of the colon. Vascular/Lymphatic: Aortic atherosclerosis. No aneurysm. No abdominal lymphadenopathy. Small portal region nodes are unchanged. Reproductive:  Normal Other: No free fluid or air. Musculoskeletal: No evidence of fracture or lytic destructive bone lesion. IMPRESSION: 1. Few calcified stones dependent within the gallbladder. No sign of ductal dilatation. However, there is suspicion of a 4 mm stone at the ampulla. 2. 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 No pronounced change. 3. Aortic atherosclerosis. 4. No hydronephrosis.  No evidence of bladder outlet obstruction. Aortic Atherosclerosis (ICD10-I70.0). Electronically Signed   By: Nelson Chimes M.D.   On: 01/10/2022 13:28   CT Head Wo Contrast  Result Date: 01/10/2022 CLINICAL DATA:  Mental status change.  Unknown cause. EXAM: CT HEAD WITHOUT CONTRAST TECHNIQUE: Contiguous axial images were obtained from the base of the skull through the vertex without intravenous contrast. RADIATION DOSE REDUCTION: This exam was performed according to the departmental dose-optimization program  which includes automated exposure control, adjustment of the mA and/or kV according to patient size and/or use of iterative reconstruction technique. COMPARISON:  CT examination dated May 17, 2021. FINDINGS: Brain: No evidence of acute infarction, hemorrhage, hydrocephalus, extra-axial collection or mass lesion/mass effect. Patchy area of low-attenuation of the periventricular white matter presumed chronic microvascular ischemic changes. Vascular: No hyperdense vessel or unexpected calcification. Skull: Normal. Negative for fracture or focal lesion. Sinuses/Orbits: Mucosal thickening and patchy opacification of the ethmoid air cells. Other: None. IMPRESSION: 1. No acute intracranial abnormality. 2. Chronic microvascular ischemic changes of the periventricular white matter. 3. Paranasal sinus disease. Electronically Signed   By: Keane Police D.O.   On: 01/10/2022 13:19      LOS: 2 days    Cordelia Poche, MD [facility] 01/12/2022, 9:54 AM   If 7PM-7AM, please contact  night-coverage [email or web address]

## 2022-01-12 NOTE — H&P (View-Only) (Signed)
[facility] Progress Note  CC:   Elevated LFTs, questionable stone at the ampulla   Subjective: He received Ativan overnight for agitation.  He is resting calmly at this time.  He passed a large amount of soft brown stool this morning as reported by his RN.  He remains NPO.  No family at the bedside.  Objective:  Vital signs in last 24 hours: Temp:  [97.2 F (36.2 C)-100.6 F (38.1 C)] 97.2 F (36.2 C) (11/08 0645) Pulse Rate:  [99-117] 99 (11/08 0500) Resp:  [11-27] 11 (11/08 0500) BP: (80-134)/(37-72) 113/72 (11/08 0500) SpO2:  [92 %-100 %] 94 % (11/08 0500) Last BM Date : 01/11/22 General: 66 year old male somewhat lethargic but arousable when name called. Heart: Regular rate and rhythm, no murmurs. Pulm: Breath sounds clear throughout.  On oxygen 2 L nasal cannula. Abdomen: Obese abdomen, soft with mild distention.  Mild generalized tenderness without rebound or guarding.  Hypoactive bowel sounds to all 4 quadrants. Abdominal scar intact. Extremities: Lower extremities with trace edema. Neurologic: Patient awakens to name.  He stated he is in the hospital and knows the year is 2023.  Speech is clear.  Moves all extremities. Psych: No agitation at this time.  Intake/Output from previous day: 11/07 0701 - 11/08 0700 In: 2720.1 [I.V.:2272.8; IV Piggyback:447.3] Out: 1150 [Urine:1150] Intake/Output this shift: No intake/output data recorded.  Lab Results: Recent Labs    01/10/22 1219 01/11/22 0438 01/12/22 0324  WBC 4.5 9.5 12.1*  HGB 13.8 13.0 11.3*  HCT 43.1 39.0 34.4*  PLT 148* 136* 102*   BMET Recent Labs    01/10/22 1850 01/11/22 0438 01/12/22 0324  NA 135 136 139  K 3.1* 3.7 3.5  CL 105 108 109  CO2 _0 GLUCOSE 139* 125* 110*  BUN _1 CREATININE 1.23 1.31* 1.17  CALCIUM 8.1* 8.1* 7.9*   LFT Recent Labs    01/12/22 0324  PROT 5.3*  ALBUMIN 2.5*  AST 156*  ALT 169*  ALKPHOS 76  BILITOT 3.2*  BILIDIR 2.1*  IBILI  1.1*   PT/INR Recent Labs    01/11/22 0438 01/12/22 0324  LABPROT 17.0* 16.5*  INR 1.4* 1.3*   Hepatitis Panel Recent Labs    01/10/22 1450  HEPBSAG NON REACTIVE  HCVAB NON REACTIVE  HEPAIGM NON REACTIVE  HEPBIGM NON REACTIVE    ECHOCARDIOGRAM COMPLETE  Result Date: 01/11/2022    ECHOCARDIOGRAM REPORT   Patient Name:   [name] [name] Date of Exam: 01/11/2022 Medical Rec #:  [id number]      Height:       69.0 in Accession #:    [id number]     Weight:       236.6 lb Date of Birth:  1956/01/17      BSA:          2.219 m Patient Age:    64 years       BP:           118/64 mmHg Patient Gender: M              HR:           102 bpm. Exam Location:  Inpatient Procedure: 2D Echo, Cardiac Doppler, Color Doppler and Intracardiac            Opacification Agent Indications:    I25.110 Atherosclerotic heart disease of native coronary artery  with unstable angina pectoris; I50.40* Unspecified combined                 systolic (congestive) and diastolic (congestive) heart failure  History:        Patient has prior history of Echocardiogram examinations, most                 recent 07/22/2019. CAD and Previous Myocardial Infarction,                 Signs/Symptoms:Dyspnea and Bacteremia; Risk Factors:Hypertension                 and Current Smoker. Chemo. Lung cancer.  Sonographer:    Roseanna Rainbow RDCS Referring Phys: [id number] [place]  Sonographer Comments: Technically difficult study due to poor echo windows, suboptimal apical window, suboptimal parasternal window, Technically challenging study due to limited acoustic windows, suboptimal subcostal window and patient is obese. Image acquisition challenging due to patient body habitus and Image acquisition challenging due to respiratory motion. Extremely difficult due to respiratory motion. Could not turn patient due to extreme pain and restraint belt. IMPRESSIONS  1. Left ventricular ejection fraction, by estimation, is 60 to 65%. The left  ventricle has normal function. The left ventricle has no regional wall motion abnormalities. Left ventricular diastolic parameters are indeterminate.  2. Right ventricular systolic function is normal. The right ventricular size is normal. Tricuspid regurgitation signal is inadequate for assessing PA pressure.  3. Left atrial size was mildly dilated.  4. Right atrial size was mildly dilated.  5. The mitral valve is grossly normal. No evidence of mitral valve regurgitation.  6. The aortic valve was not well visualized. Aortic valve regurgitation is not visualized. No aortic stenosis is present.  7. The inferior vena cava is normal in size with greater than 50% respiratory variability, suggesting right atrial pressure of 3 mmHg. Comparison(s): There is prominent respiration related variation in mitral inflow velocities and ventricular septal displacement (but no other findings to support constrictive or tamponade physiology). Consider increased work of breathing due to asthma/COPD exacerbation. FINDINGS  Left Ventricle: Left ventricular ejection fraction, by estimation, is 60 to 65%. The left ventricle has normal function. The left ventricle has no regional wall motion abnormalities. Definity contrast agent was given IV to delineate the left ventricular  endocardial borders. The left ventricular internal cavity size was normal in size. There is borderline concentric left ventricular hypertrophy. Left ventricular diastolic parameters are indeterminate. Right Ventricle: The right ventricular size is normal. No increase in right ventricular wall thickness. Right ventricular systolic function is normal. Tricuspid regurgitation signal is inadequate for assessing PA pressure. Left Atrium: Left atrial size was mildly dilated. Right Atrium: Right atrial size was mildly dilated. Pericardium: The pericardium was not well visualized. Presence of epicardial fat layer. Mitral Valve: The mitral valve is grossly normal. No evidence  of mitral valve regurgitation. Tricuspid Valve: The tricuspid valve is not well visualized. Tricuspid valve regurgitation is not demonstrated. Aortic Valve: The aortic valve was not well visualized. Aortic valve regurgitation is not visualized. No aortic stenosis is present. Pulmonic Valve: The pulmonic valve was not well visualized. Pulmonic valve regurgitation is not visualized. Aorta: The ascending aorta was not well visualized. Venous: The inferior vena cava is normal in size with greater than 50% respiratory variability, suggesting right atrial pressure of 3 mmHg. IAS/Shunts: No atrial level shunt detected by color flow Doppler.  LEFT VENTRICLE PLAX 2D LVIDd:  5.60 cm     Diastology LVIDs:         3.70 cm     LV e' medial:    8.27 cm/s LV PW:         1.20 cm     LV E/e' medial:  8.9 LV IVS:        1.10 cm     LV e' lateral:   7.40 cm/s LVOT diam:     2.20 cm     LV E/e' lateral: 10.0 LV SV:         32 LV SV Index:   14 LVOT Area:     3.80 cm  LV Volumes (MOD) LV vol d, MOD A2C: 53.6 ml LV vol d, MOD A4C: 69.9 ml LV vol s, MOD A2C: 20.2 ml LV vol s, MOD A4C: 27.0 ml LV SV MOD A2C:     33.4 ml LV SV MOD A4C:     69.9 ml LV SV MOD BP:      40.7 ml RIGHT VENTRICLE            IVC RV S prime:     6.96 cm/s  IVC diam: 2.10 cm TAPSE (M-mode): 1.9 cm LEFT ATRIUM           Index        RIGHT ATRIUM           Index LA diam:      3.90 cm 1.76 cm/m   RA Area:     20.30 cm LA Vol (A2C): 11.3 ml 5.09 ml/m   RA Volume:   60.70 ml  27.36 ml/m LA Vol (A4C): 49.4 ml 22.26 ml/m  AORTIC VALVE LVOT Vmax:   60.30 cm/s LVOT Vmean:  37.900 cm/s LVOT VTI:    0.084 m  AORTA Ao Root diam: 3.70 cm Ao Asc diam:  3.60 cm MITRAL VALVE MV Area (PHT): 4.69 cm    SHUNTS MV Decel Time: 162 msec    Systemic VTI:  0.08 m MV E velocity: 73.87 cm/s  Systemic Diam: 2.20 cm MV A velocity: 79.13 cm/s MV E/A ratio:  0.93 Mihai Croitoru MD Electronically signed by Sanda Klein MD Signature Date/Time: 01/11/2022/1:45:55 PM    Final    DG  Abd Portable 1V  Result Date: 01/11/2022 CLINICAL DATA:  Abdominal distension and pain. EXAM: PORTABLE ABDOMEN - 1 VIEW COMPARISON:  01/10/2022 FINDINGS: Exam detail is diminished secondary to body habitus. There is been mild increase in caliber of the small bowel loops within the right lower quadrant of the abdomen measuring up to 2.9 cm. Decreased colonic gas compared with the prior exam. IMPRESSION: Mild increase in caliber of the small bowel loops within the right lower quadrant of the abdomen with decrease colonic gas. If there is a clinical concern for developing bowel obstruction advise follow-up serial abdominal imaging to assess change in small bowel caliber. Electronically Signed   By: Kerby Moors M.D.   On: 01/11/2022 13:17   US Abdomen Limited RUQ (LIVER/GB)  Result Date: 01/10/2022 CLINICAL DATA:  Right upper quadrant abdominal pain EXAM: ULTRASOUND ABDOMEN LIMITED RIGHT UPPER QUADRANT COMPARISON:  CT scan 01/10/2022 FINDINGS: Gallbladder: Cholelithiasis noted, individual stones measuring up to 1.4 cm in diameter. Sonographic Murphy's sign absent. Most of the hyperechogenicity between the gallbladder in the adjacent liver is most likely attributable to adipose tissue given comparison to the CT scan from earlier today; no definite gallbladder wall thickening is observed. Common bile duct: Diameter: 0.5 cm in  diameter. The choledocholithiasis shown on the CT from earlier today is not readily apparent on ultrasound. Liver: No focal lesion identified. Coarse echogenic liver with poor sonic penetration compatible with diffuse hepatic steatosis. Portal vein is patent on color Doppler imaging with normal direction of blood flow towards the liver. Limitations include patient difficulty with breath holding or remaining stationary. IMPRESSION: 1. Cholelithiasis without definite sonographic evidence of acute cholecystitis. 2. Choledocholithiasis with shown on the CT from 12/10/2021 but was not well seen  on today's ultrasound. 3. Coarse echogenic liver with poor sonic penetration compatible with diffuse hepatic steatosis. Electronically Signed   By: Van Clines M.D.   On: 01/10/2022 17:51   DG Chest Port 1 View  Result Date: 01/10/2022 CLINICAL DATA:  Concern for sepsis. EXAM: PORTABLE CHEST 1 VIEW COMPARISON:  05/17/2021; 04/09/2021 FINDINGS: Grossly unchanged enlarged cardiac silhouette and mediastinal contours with nodular prominence of the pulmonary hila. Mild pulmonary venous congestion without frank evidence of edema. No focal airspace opacities. No pleural effusion or pneumothorax. Stable position of support apparatus. No acute osseous abnormalities. IMPRESSION: Pulmonary venous congestion without frank evidence of edema on this AP portable examination. No discrete focal airspace opacities to suggest pneumonia. Further evaluation with a PA and lateral chest radiograph may be obtained as clinically indicated. Electronically Signed   By: Sandi Mariscal M.D.   On: 01/10/2022 13:33   CT ABDOMEN PELVIS WO CONTRAST  Result Date: 01/10/2022 CLINICAL DATA:  Abdominal pain, acute, nonlocalized. Patient reports inability to urinate. EXAM: CT ABDOMEN AND PELVIS WITHOUT CONTRAST TECHNIQUE: Multidetector CT imaging of the abdomen and pelvis was performed following the standard protocol without IV contrast. RADIATION DOSE REDUCTION: This exam was performed according to the departmental dose-optimization program which includes automated exposure control, adjustment of the mA and/or kV according to patient size and/or use of iterative reconstruction technique. COMPARISON:  10/19/2021 FINDINGS: Lower chest: Lung bases are clear.  No pleural or pericardial fluid. Hepatobiliary: Liver parenchyma is normal. Small amount of air in the biliary tree, less than seen previously. Few calcified stones dependent within the gallbladder. No sign of ductal dilatation. However, there is suspicion of a 4 mm stone at the ampulla.  Pancreas: Fatty atrophy.  No acute finding. Spleen: Normal Adrenals/Urinary Tract: Right adrenal gland is normal. 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 No pronounced change. No change in appearance of the kidneys. Bilateral cysts for which specific follow-up is not suggested. No hydronephrosis. Bladder is normal. Stomach/Bowel: Stomach and small intestine are normal. Normal appearance of the colon. Vascular/Lymphatic: Aortic atherosclerosis. No aneurysm. No abdominal lymphadenopathy. Small portal region nodes are unchanged. Reproductive: Normal Other: No free fluid or air. Musculoskeletal: No evidence of fracture or lytic destructive bone lesion. IMPRESSION: 1. Few calcified stones dependent within the gallbladder. No sign of ductal dilatation. However, there is suspicion of a 4 mm stone at the ampulla. 2. 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 No pronounced change. 3. Aortic atherosclerosis. 4. No hydronephrosis.  No evidence of bladder outlet obstruction. Aortic Atherosclerosis (ICD10-I70.0). Electronically Signed   By: Nelson Chimes M.D.   On: 01/10/2022 13:28   CT Head Wo Contrast  Result Date: 01/10/2022 CLINICAL DATA:  Mental status change.  Unknown cause. EXAM: CT HEAD WITHOUT CONTRAST TECHNIQUE: Contiguous axial images were obtained from the base of the skull through the vertex without intravenous contrast. RADIATION DOSE REDUCTION: This exam was performed according to the departmental dose-optimization program  which includes automated exposure control, adjustment of the mA and/or kV according to patient size and/or use of iterative reconstruction technique. COMPARISON:  CT examination dated May 17, 2021. FINDINGS: Brain: No evidence of acute infarction, hemorrhage, hydrocephalus, extra-axial collection or mass lesion/mass effect. Patchy area of low-attenuation of the periventricular white matter presumed  chronic microvascular ischemic changes. Vascular: No hyperdense vessel or unexpected calcification. Skull: Normal. Negative for fracture or focal lesion. Sinuses/Orbits: Mucosal thickening and patchy opacification of the ethmoid air cells. Other: None. IMPRESSION: 1. No acute intracranial abnormality. 2. Chronic microvascular ischemic changes of the periventricular white matter. 3. Paranasal sinus disease. Electronically Signed   By: Keane Police D.O.   On: 01/10/2022 13:19    Assessment / Plan:  29) 66 year old male with a history of choledocholithiasis status post ERCP/sphincterotomy with stone extraction 02/2021 admitted to the hospital 11/6 with abdominal pain, diarrhea and fever of 102F.  WBC 9.5 -> 12.1. T. Bili 2.1 -> 4.9 -> 3.2. Direct bili 3.2 -> 2.1. AST 372 -> 375 -> 156. ALT 221 -> 286 -> 169. Alk phos 94 -> 76. Albumin 2.9 -> 2.5. Acute hepatitis panel negative. Lipase 45.  INR 1.4 -> 1.3. CTAP identified a few calcified stones in the gallbladder without biliary ductal dilatation with suspicion of a 4 mm stone at the ampulla. RUQ sono showed hepatic steatosis, cholelithiasis without evidence of acute cholecystitis and no choledocholithiasis. Sepsis secondary to possible cholecystitis vs ascending cholangitis. Blood cultures grew E. Coli, received Ceftriaxone and Metronidazole IV -> switched to Zosyn. General surgery consulted, no recommendations for a cholecystectomy at this time. Plan for ERCP tomorrow.  -Clear liquids today if awake enough to tolerate then NPO after midnight -ERCP with Dr. Henrene Pastor scheduled 01/13/2022. T.bili and AST/ALT levels drifting downward. Repeat hepatic panel in am. Dr. Henrene Pastor to verify if patient to proceed with ERCP as scheduled.  -Hold today's 10pm dose of Lovenox in preparation for ERCP tomorrow -Continue IV fluids -Continue IV Zosyn -CBC, CMP and INR in am -Pain management per the hospitalist  -Await further recommendations per Dr. 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 PLT 136 -> 102.   4) History of tubular adenomatous/hyperplastic polyps per colonoscopy 10/2014   5) History of GERD -Pantoprazole 40 mg IV every 24 hours   6) History of coronary artery disease s/p DES to the LAD and RCA 2006, inferior STEMI with thrombectomy and DES RCA 07/2016. Brilinta on hold since admission. Chronic diastolic CHF LV EF 60 - 51% with normal RV function per ECHO 01/11/2022. -Continue to hold Brilinta  7) Thoracic aortic aneurysm   8) Acute metabolic encephalopathy in setting of acute illness/sepsis   9) Hypocalcemia -Management per the hospitalist    Principal Problem:   Sepsis due to undetermined organism [facility]) Active Problems:   CAD S/P percutaneous coronary angioplasty   Essential hypertension   Dyslipidemia, goal LDL below 70   Chronic diastolic congestive heart failure (HCC)   Non-small cell carcinoma of right lung, stage 4 (HCC)   Thrombocytopenia (HCC)   COPD (chronic obstructive pulmonary disease) ([place])   Abdominal pain   Cholelithiasis   Abnormal liver enzymes     LOS: 2 days   Noralyn Pick  01/12/2022, 10:29 AM

## 2022-01-12 NOTE — Progress Notes (Addendum)
[name] [name]  CC:   Elevated LFTs, questionable stone at the ampulla   Subjective: He received Ativan overnight for agitation.  He is resting calmly at this time.  He passed a large amount of soft brown stool this morning as reported by his RN.  He remains NPO.  No family at the bedside.  Objective:  Vital signs in last 24 hours: Temp:  [97.2 F (36.2 C)-100.6 F (38.1 C)] 97.2 F (36.2 C) (11/08 0645) Pulse Rate:  [99-117] 99 (11/08 0500) Resp:  [11-27] 11 (11/08 0500) BP: (80-134)/(37-72) 113/72 (11/08 0500) SpO2:  [92 %-100 %] 94 % (11/08 0500) Last BM Date : 01/11/22 General: 66 year old male somewhat lethargic but arousable when name called. Heart: Regular rate and rhythm, no murmurs. Pulm: Breath sounds clear throughout.  On oxygen 2 L nasal cannula. Abdomen: Obese abdomen, soft with mild distention.  Mild generalized tenderness without rebound or guarding.  Hypoactive bowel sounds to all 4 quadrants. Abdominal scar intact. Extremities: Lower extremities with trace edema. Neurologic: Patient awakens to name.  He stated he is in the hospital and knows the year is 2023.  Speech is clear.  Moves all extremities. Psych: No agitation at this time.  Intake/Output from previous day: 11/07 0701 - 11/08 0700 In: 2720.1 [I.V.:2272.8; IV Piggyback:447.3] Out: 1150 [Urine:1150] Intake/Output this shift: No intake/output data recorded.  Lab Results: Recent Labs    01/10/22 1219 01/11/22 0438 01/12/22 0324  WBC 4.5 9.5 12.1*  HGB 13.8 13.0 11.3*  HCT 43.1 39.0 34.4*  PLT 148* 136* 102*   BMET Recent Labs    01/10/22 1850 01/11/22 0438 01/12/22 0324  NA 135 136 139  K 3.1* 3.7 3.5  CL 105 108 109  CO2 _0 GLUCOSE 139* 125* 110*  BUN _1 CREATININE 1.23 1.31* 1.17  CALCIUM 8.1* 8.1* 7.9*   LFT Recent Labs    01/12/22 0324  PROT 5.3*  ALBUMIN 2.5*  AST 156*  ALT 169*  ALKPHOS 76  BILITOT 3.2*  BILIDIR 2.1*  IBILI  1.1*   PT/INR Recent Labs    01/11/22 0438 01/12/22 0324  LABPROT 17.0* 16.5*  INR 1.4* 1.3*   Hepatitis Panel Recent Labs    01/10/22 1450  HEPBSAG NON REACTIVE  HCVAB NON REACTIVE  HEPAIGM NON REACTIVE  HEPBIGM NON REACTIVE    ECHOCARDIOGRAM COMPLETE  Result Date: 01/11/2022    ECHOCARDIOGRAM REPORT   Patient Name:   [name] [name] Date of Exam: 01/11/2022 Medical Rec #:  [id number]      Height:       69.0 in Accession #:    [id number]     Weight:       236.6 lb Date of Birth:  1955/09/30      BSA:          2.219 m Patient Age:    4 years       BP:           118/64 mmHg Patient Gender: M              HR:           102 bpm. Exam Location:  Inpatient Procedure: 2D Echo, Cardiac Doppler, Color Doppler and Intracardiac            Opacification Agent Indications:    I25.110 Atherosclerotic heart disease of native coronary artery  with unstable angina pectoris; I50.40* Unspecified combined                 systolic (congestive) and diastolic (congestive) heart failure  History:        Patient has prior history of Echocardiogram examinations, most                 recent 07/22/2019. CAD and Previous Myocardial Infarction,                 Signs/Symptoms:Dyspnea and Bacteremia; Risk Factors:Hypertension                 and Current Smoker. Chemo. Lung cancer.  Sonographer:    Roseanna Rainbow RDCS Referring Phys: [id number] [place]  Sonographer Comments: Technically difficult study due to poor echo windows, suboptimal apical window, suboptimal parasternal window, Technically challenging study due to limited acoustic windows, suboptimal subcostal window and patient is obese. Image acquisition challenging due to patient body habitus and Image acquisition challenging due to respiratory motion. Extremely difficult due to respiratory motion. Could not turn patient due to extreme pain and restraint belt. IMPRESSIONS  1. Left ventricular ejection fraction, by estimation, is 60 to 65%. The left  ventricle has normal function. The left ventricle has no regional wall motion abnormalities. Left ventricular diastolic parameters are indeterminate.  2. Right ventricular systolic function is normal. The right ventricular size is normal. Tricuspid regurgitation signal is inadequate for assessing PA pressure.  3. Left atrial size was mildly dilated.  4. Right atrial size was mildly dilated.  5. The mitral valve is grossly normal. No evidence of mitral valve regurgitation.  6. The aortic valve was not well visualized. Aortic valve regurgitation is not visualized. No aortic stenosis is present.  7. The inferior vena cava is normal in size with greater than 50% respiratory variability, suggesting right atrial pressure of 3 mmHg. Comparison(s): There is prominent respiration related variation in mitral inflow velocities and ventricular septal displacement (but no other findings to support constrictive or tamponade physiology). Consider increased work of breathing due to asthma/COPD exacerbation. FINDINGS  Left Ventricle: Left ventricular ejection fraction, by estimation, is 60 to 65%. The left ventricle has normal function. The left ventricle has no regional wall motion abnormalities. Definity contrast agent was given IV to delineate the left ventricular  endocardial borders. The left ventricular internal cavity size was normal in size. There is borderline concentric left ventricular hypertrophy. Left ventricular diastolic parameters are indeterminate. Right Ventricle: The right ventricular size is normal. No increase in right ventricular wall thickness. Right ventricular systolic function is normal. Tricuspid regurgitation signal is inadequate for assessing PA pressure. Left Atrium: Left atrial size was mildly dilated. Right Atrium: Right atrial size was mildly dilated. Pericardium: The pericardium was not well visualized. Presence of epicardial fat layer. Mitral Valve: The mitral valve is grossly normal. No evidence  of mitral valve regurgitation. Tricuspid Valve: The tricuspid valve is not well visualized. Tricuspid valve regurgitation is not demonstrated. Aortic Valve: The aortic valve was not well visualized. Aortic valve regurgitation is not visualized. No aortic stenosis is present. Pulmonic Valve: The pulmonic valve was not well visualized. Pulmonic valve regurgitation is not visualized. Aorta: The ascending aorta was not well visualized. Venous: The inferior vena cava is normal in size with greater than 50% respiratory variability, suggesting right atrial pressure of 3 mmHg. IAS/Shunts: No atrial level shunt detected by color flow Doppler.  LEFT VENTRICLE PLAX 2D LVIDd:  5.60 cm     Diastology LVIDs:         3.70 cm     LV e' medial:    8.27 cm/s LV PW:         1.20 cm     LV E/e' medial:  8.9 LV IVS:        1.10 cm     LV e' lateral:   7.40 cm/s LVOT diam:     2.20 cm     LV E/e' lateral: 10.0 LV SV:         32 LV SV Index:   14 LVOT Area:     3.80 cm  LV Volumes (MOD) LV vol d, MOD A2C: 53.6 ml LV vol d, MOD A4C: 69.9 ml LV vol s, MOD A2C: 20.2 ml LV vol s, MOD A4C: 27.0 ml LV SV MOD A2C:     33.4 ml LV SV MOD A4C:     69.9 ml LV SV MOD BP:      40.7 ml RIGHT VENTRICLE            IVC RV S prime:     6.96 cm/s  IVC diam: 2.10 cm TAPSE (M-mode): 1.9 cm LEFT ATRIUM           Index        RIGHT ATRIUM           Index LA diam:      3.90 cm 1.76 cm/m   RA Area:     20.30 cm LA Vol (A2C): 11.3 ml 5.09 ml/m   RA Volume:   60.70 ml  27.36 ml/m LA Vol (A4C): 49.4 ml 22.26 ml/m  AORTIC VALVE LVOT Vmax:   60.30 cm/s LVOT Vmean:  37.900 cm/s LVOT VTI:    0.084 m  AORTA Ao Root diam: 3.70 cm Ao Asc diam:  3.60 cm MITRAL VALVE MV Area (PHT): 4.69 cm    SHUNTS MV Decel Time: 162 msec    Systemic VTI:  0.08 m MV E velocity: 73.87 cm/s  Systemic Diam: 2.20 cm MV A velocity: 79.13 cm/s MV E/A ratio:  0.93 Mihai Croitoru MD Electronically signed by Sanda Klein MD Signature Date/Time: 01/11/2022/1:45:55 PM    Final    DG  Abd Portable 1V  Result Date: 01/11/2022 CLINICAL DATA:  Abdominal distension and pain. EXAM: PORTABLE ABDOMEN - 1 VIEW COMPARISON:  01/10/2022 FINDINGS: Exam detail is diminished secondary to body habitus. There is been mild increase in caliber of the small bowel loops within the right lower quadrant of the abdomen measuring up to 2.9 cm. Decreased colonic gas compared with the prior exam. IMPRESSION: Mild increase in caliber of the small bowel loops within the right lower quadrant of the abdomen with decrease colonic gas. If there is a clinical concern for developing bowel obstruction advise follow-up serial abdominal imaging to assess change in small bowel caliber. Electronically Signed   By: Kerby Moors M.D.   On: 01/11/2022 13:17   US Abdomen Limited RUQ (LIVER/GB)  Result Date: 01/10/2022 CLINICAL DATA:  Right upper quadrant abdominal pain EXAM: ULTRASOUND ABDOMEN LIMITED RIGHT UPPER QUADRANT COMPARISON:  CT scan 01/10/2022 FINDINGS: Gallbladder: Cholelithiasis noted, individual stones measuring up to 1.4 cm in diameter. Sonographic Murphy's sign absent. Most of the hyperechogenicity between the gallbladder in the adjacent liver is most likely attributable to adipose tissue given comparison to the CT scan from earlier today; no definite gallbladder wall thickening is observed. Common bile duct: Diameter: 0.5 cm in  diameter. The choledocholithiasis shown on the CT from earlier today is not readily apparent on ultrasound. Liver: No focal lesion identified. Coarse echogenic liver with poor sonic penetration compatible with diffuse hepatic steatosis. Portal vein is patent on color Doppler imaging with normal direction of blood flow towards the liver. Limitations include patient difficulty with breath holding or remaining stationary. IMPRESSION: 1. Cholelithiasis without definite sonographic evidence of acute cholecystitis. 2. Choledocholithiasis with shown on the CT from 12/10/2021 but was not well seen  on today's ultrasound. 3. Coarse echogenic liver with poor sonic penetration compatible with diffuse hepatic steatosis. Electronically Signed   By: Van Clines M.D.   On: 01/10/2022 17:51   DG Chest Port 1 View  Result Date: 01/10/2022 CLINICAL DATA:  Concern for sepsis. EXAM: PORTABLE CHEST 1 VIEW COMPARISON:  05/17/2021; 04/09/2021 FINDINGS: Grossly unchanged enlarged cardiac silhouette and mediastinal contours with nodular prominence of the pulmonary hila. Mild pulmonary venous congestion without frank evidence of edema. No focal airspace opacities. No pleural effusion or pneumothorax. Stable position of support apparatus. No acute osseous abnormalities. IMPRESSION: Pulmonary venous congestion without frank evidence of edema on this AP portable examination. No discrete focal airspace opacities to suggest pneumonia. Further evaluation with a PA and lateral chest radiograph may be obtained as clinically indicated. Electronically Signed   By: Sandi Mariscal M.D.   On: 01/10/2022 13:33   CT ABDOMEN PELVIS WO CONTRAST  Result Date: 01/10/2022 CLINICAL DATA:  Abdominal pain, acute, nonlocalized. Patient reports inability to urinate. EXAM: CT ABDOMEN AND PELVIS WITHOUT CONTRAST TECHNIQUE: Multidetector CT imaging of the abdomen and pelvis was performed following the standard protocol without IV contrast. RADIATION DOSE REDUCTION: This exam was performed according to the departmental dose-optimization program which includes automated exposure control, adjustment of the mA and/or kV according to patient size and/or use of iterative reconstruction technique. COMPARISON:  10/19/2021 FINDINGS: Lower chest: Lung bases are clear.  No pleural or pericardial fluid. Hepatobiliary: Liver parenchyma is normal. Small amount of air in the biliary tree, less than seen previously. Few calcified stones dependent within the gallbladder. No sign of ductal dilatation. However, there is suspicion of a 4 mm stone at the ampulla.  Pancreas: Fatty atrophy.  No acute finding. Spleen: Normal Adrenals/Urinary Tract: Right adrenal gland is normal. 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 No pronounced change. No change in appearance of the kidneys. Bilateral cysts for which specific follow-up is not suggested. No hydronephrosis. Bladder is normal. Stomach/Bowel: Stomach and small intestine are normal. Normal appearance of the colon. Vascular/Lymphatic: Aortic atherosclerosis. No aneurysm. No abdominal lymphadenopathy. Small portal region nodes are unchanged. Reproductive: Normal Other: No free fluid or air. Musculoskeletal: No evidence of fracture or lytic destructive bone lesion. IMPRESSION: 1. Few calcified stones dependent within the gallbladder. No sign of ductal dilatation. However, there is suspicion of a 4 mm stone at the ampulla. 2. 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 No pronounced change. 3. Aortic atherosclerosis. 4. No hydronephrosis.  No evidence of bladder outlet obstruction. Aortic Atherosclerosis (ICD10-I70.0). Electronically Signed   By: Nelson Chimes M.D.   On: 01/10/2022 13:28   CT Head Wo Contrast  Result Date: 01/10/2022 CLINICAL DATA:  Mental status change.  Unknown cause. EXAM: CT HEAD WITHOUT CONTRAST TECHNIQUE: Contiguous axial images were obtained from the base of the skull through the vertex without intravenous contrast. RADIATION DOSE REDUCTION: This exam was performed according to the departmental dose-optimization program  which includes automated exposure control, adjustment of the mA and/or kV according to patient size and/or use of iterative reconstruction technique. COMPARISON:  CT examination dated May 17, 2021. FINDINGS: Brain: No evidence of acute infarction, hemorrhage, hydrocephalus, extra-axial collection or mass lesion/mass effect. Patchy area of low-attenuation of the periventricular white matter presumed  chronic microvascular ischemic changes. Vascular: No hyperdense vessel or unexpected calcification. Skull: Normal. Negative for fracture or focal lesion. Sinuses/Orbits: Mucosal thickening and patchy opacification of the ethmoid air cells. Other: None. IMPRESSION: 1. No acute intracranial abnormality. 2. Chronic microvascular ischemic changes of the periventricular white matter. 3. Paranasal sinus disease. Electronically Signed   By: Keane Police D.O.   On: 01/10/2022 13:19    Assessment / Plan:  47) 66 year old male with a history of choledocholithiasis status post ERCP/sphincterotomy with stone extraction 02/2021 admitted to the hospital 11/6 with abdominal pain, diarrhea and fever of 102F.  WBC 9.5 -> 12.1. T. Bili 2.1 -> 4.9 -> 3.2. Direct bili 3.2 -> 2.1. AST 372 -> 375 -> 156. ALT 221 -> 286 -> 169. Alk phos 94 -> 76. Albumin 2.9 -> 2.5. Acute hepatitis panel negative. Lipase 45.  INR 1.4 -> 1.3. CTAP identified a few calcified stones in the gallbladder without biliary ductal dilatation with suspicion of a 4 mm stone at the ampulla. RUQ sono showed hepatic steatosis, cholelithiasis without evidence of acute cholecystitis and no choledocholithiasis. Sepsis secondary to possible cholecystitis vs ascending cholangitis. Blood cultures grew E. Coli, received Ceftriaxone and Metronidazole IV -> switched to Zosyn. General surgery consulted, no recommendations for a cholecystectomy at this time. Plan for ERCP tomorrow.  -Clear liquids today if awake enough to tolerate then NPO after midnight -ERCP with Dr. Henrene Pastor scheduled 01/13/2022. T.bili and AST/ALT levels drifting downward. Repeat hepatic panel in am. Dr. Henrene Pastor to verify if patient to proceed with ERCP as scheduled.  -Hold today's 10pm dose of Lovenox in preparation for ERCP tomorrow -Continue IV fluids -Continue IV Zosyn -CBC, CMP and INR in am -Pain management per the hospitalist  -Await further recommendations per Dr. 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 PLT 136 -> 102.   4) History of tubular adenomatous/hyperplastic polyps per colonoscopy 10/2014   5) History of GERD -Pantoprazole 40 mg IV every 24 hours   6) History of coronary artery disease s/p DES to the LAD and RCA 2006, inferior STEMI with thrombectomy and DES RCA 07/2016. Brilinta on hold since admission. Chronic diastolic CHF LV EF 60 - 51% with normal RV function per ECHO 01/11/2022. -Continue to hold Brilinta  7) Thoracic aortic aneurysm   8) Acute metabolic encephalopathy in setting of acute illness/sepsis   9) Hypocalcemia -Management per the hospitalist    Principal Problem:   Sepsis due to undetermined organism [facility]) Active Problems:   CAD S/P percutaneous coronary angioplasty   Essential hypertension   Dyslipidemia, goal LDL below 70   Chronic diastolic congestive heart failure (HCC)   Non-small cell carcinoma of right lung, stage 4 (HCC)   Thrombocytopenia (HCC)   COPD (chronic obstructive pulmonary disease) ([place])   Abdominal pain   Cholelithiasis   Abnormal liver enzymes     LOS: 2 days   Noralyn Pick  01/12/2022, 10:29 AM

## 2022-01-12 NOTE — Progress Notes (Signed)
Progress Note     Subjective: Reports he is feeling a bit better today but still has abdominal pain, gesturing to the lower abdomen.  Objective: Vital signs in last 24 hours: Temp:  [97.2 F (36.2 C)-99.1 F (37.3 C)] 97.2 F (36.2 C) (11/08 0645) Pulse Rate:  [99-114] 99 (11/08 0500) Resp:  [11-27] 16 (11/08 0818) BP: (90-134)/(37-72) 113/72 (11/08 0500) SpO2:  [92 %-100 %] 98 % (11/08 0818) FiO2 (%):  [0 %] 0 % (11/08 0818) Last BM Date : 01/11/22  Intake/Output from previous day: 11/07 0701 - 11/08 0700 In: 2720.1 [I.V.:2272.8; IV Piggyback:447.3] Out: 1150 [Urine:1150] Intake/Output this shift: No intake/output data recorded.  PE: General: more cogent today, resting comfortably when I entered the room Heart: regular, rate, and rhythm.  Heart rate is in the 80s this morning. Palpable radial pulses bilaterally Lungs: Respiratory effort nonlabored Abd: Obese, soft, mildly diffusely tender without peritoneal signs MSK: all 4 extremities are symmetrical with no cyanosis, clubbing, or edema. Skin: warm and dry Psych: Alert. agitated   Lab Results:  Recent Labs    01/11/22 0438 01/12/22 0324  WBC 9.5 12.1*  HGB 13.0 11.3*  HCT 39.0 34.4*  PLT 136* 102*    BMET Recent Labs    01/11/22 0438 01/12/22 0324  NA 136 139  K 3.7 3.5  CL 108 109  CO2 22 25  GLUCOSE 125* 110*  BUN 13 17  CREATININE 1.31* 1.17  CALCIUM 8.1* 7.9*    PT/INR Recent Labs    01/11/22 0438 01/12/22 0324  LABPROT 17.0* 16.5*  INR 1.4* 1.3*    CMP     Component Value Date/Time   NA 139 01/12/2022 0324   K 3.5 01/12/2022 0324   CL 109 01/12/2022 0324   CO2 25 01/12/2022 0324   GLUCOSE 110 (H) 01/12/2022 0324   BUN 17 01/12/2022 0324   CREATININE 1.17 01/12/2022 0324   CREATININE 1.20 10/19/2021 1150   CREATININE 1.09 07/22/2016 1111   CALCIUM 7.9 (L) 01/12/2022 0324   PROT 5.3 (L) 01/12/2022 0324   ALBUMIN 2.5 (L) 01/12/2022 0324   AST 156 (H) 01/12/2022 0324   AST  22 10/19/2021 1150   ALT 169 (H) 01/12/2022 0324   ALT 36 10/19/2021 1150   ALKPHOS 76 01/12/2022 0324   BILITOT 3.2 (H) 01/12/2022 0324   BILITOT 0.8 10/19/2021 1150   GFRNONAA >60 01/12/2022 0324   GFRNONAA >60 10/19/2021 1150   GFRAA >60 01/16/2017 0405   Lipase     Component Value Date/Time   LIPASE 45 01/10/2022 1448       Studies/Results: ECHOCARDIOGRAM COMPLETE  Result Date: 01/11/2022    ECHOCARDIOGRAM REPORT   Patient Name:   TIRRELL BUCHBERGER Date of Exam: 01/11/2022 Medical Rec #:  [id number]      Height:       69.0 in Accession #:    [id number]     Weight:       236.6 lb Date of Birth:  Oct 25, 1955      BSA:          2.219 m Patient Age:    65 years       BP:           118/64 mmHg Patient Gender: M              HR:           102 bpm. Exam Location:  Inpatient Procedure: 2D Echo, Cardiac Doppler, Color Doppler and Intracardiac  Opacification Agent Indications:    I25.110 Atherosclerotic heart disease of native coronary artery                 with unstable angina pectoris; I50.40* Unspecified combined                 systolic (congestive) and diastolic (congestive) heart failure  History:        Patient has prior history of Echocardiogram examinations, most                 recent 07/22/2019. CAD and Previous Myocardial Infarction,                 Signs/Symptoms:Dyspnea and Bacteremia; Risk Factors:Hypertension                 and Current Smoker. Chemo. Lung cancer.  Sonographer:    Roseanna Rainbow RDCS Referring Phys: [id number] [place]  Sonographer Comments: Technically difficult study due to poor echo windows, suboptimal apical window, suboptimal parasternal window, Technically challenging study due to limited acoustic windows, suboptimal subcostal window and patient is obese. Image acquisition challenging due to patient body habitus and Image acquisition challenging due to respiratory motion. Extremely difficult due to respiratory motion. Could not turn patient due to extreme  pain and restraint belt. IMPRESSIONS  1. Left ventricular ejection fraction, by estimation, is 60 to 65%. The left ventricle has normal function. The left ventricle has no regional wall motion abnormalities. Left ventricular diastolic parameters are indeterminate.  2. Right ventricular systolic function is normal. The right ventricular size is normal. Tricuspid regurgitation signal is inadequate for assessing PA pressure.  3. Left atrial size was mildly dilated.  4. Right atrial size was mildly dilated.  5. The mitral valve is grossly normal. No evidence of mitral valve regurgitation.  6. The aortic valve was not well visualized. Aortic valve regurgitation is not visualized. No aortic stenosis is present.  7. The inferior vena cava is normal in size with greater than 50% respiratory variability, suggesting right atrial pressure of 3 mmHg. Comparison(s): There is prominent respiration related variation in mitral inflow velocities and ventricular septal displacement (but no other findings to support constrictive or tamponade physiology). Consider increased work of breathing due to asthma/COPD exacerbation. FINDINGS  Left Ventricle: Left ventricular ejection fraction, by estimation, is 60 to 65%. The left ventricle has normal function. The left ventricle has no regional wall motion abnormalities. Definity contrast agent was given IV to delineate the left ventricular  endocardial borders. The left ventricular internal cavity size was normal in size. There is borderline concentric left ventricular hypertrophy. Left ventricular diastolic parameters are indeterminate. Right Ventricle: The right ventricular size is normal. No increase in right ventricular wall thickness. Right ventricular systolic function is normal. Tricuspid regurgitation signal is inadequate for assessing PA pressure. Left Atrium: Left atrial size was mildly dilated. Right Atrium: Right atrial size was mildly dilated. Pericardium: The pericardium was not  well visualized. Presence of epicardial fat layer. Mitral Valve: The mitral valve is grossly normal. No evidence of mitral valve regurgitation. Tricuspid Valve: The tricuspid valve is not well visualized. Tricuspid valve regurgitation is not demonstrated. Aortic Valve: The aortic valve was not well visualized. Aortic valve regurgitation is not visualized. No aortic stenosis is present. Pulmonic Valve: The pulmonic valve was not well visualized. Pulmonic valve regurgitation is not visualized. Aorta: The ascending aorta was not well visualized. Venous: The inferior vena cava is normal in size with greater than 50% respiratory variability, suggesting  right atrial pressure of 3 mmHg. IAS/Shunts: No atrial level shunt detected by color flow Doppler.  LEFT VENTRICLE PLAX 2D LVIDd:         5.60 cm     Diastology LVIDs:         3.70 cm     LV e' medial:    8.27 cm/s LV PW:         1.20 cm     LV E/e' medial:  8.9 LV IVS:        1.10 cm     LV e' lateral:   7.40 cm/s LVOT diam:     2.20 cm     LV E/e' lateral: 10.0 LV SV:         32 LV SV Index:   14 LVOT Area:     3.80 cm  LV Volumes (MOD) LV vol d, MOD A2C: 53.6 ml LV vol d, MOD A4C: 69.9 ml LV vol s, MOD A2C: 20.2 ml LV vol s, MOD A4C: 27.0 ml LV SV MOD A2C:     33.4 ml LV SV MOD A4C:     69.9 ml LV SV MOD BP:      40.7 ml RIGHT VENTRICLE            IVC RV S prime:     6.96 cm/s  IVC diam: 2.10 cm TAPSE (M-mode): 1.9 cm LEFT ATRIUM           Index        RIGHT ATRIUM           Index LA diam:      3.90 cm 1.76 cm/m   RA Area:     20.30 cm LA Vol (A2C): 11.3 ml 5.09 ml/m   RA Volume:   60.70 ml  27.36 ml/m LA Vol (A4C): 49.4 ml 22.26 ml/m  AORTIC VALVE LVOT Vmax:   60.30 cm/s LVOT Vmean:  37.900 cm/s LVOT VTI:    0.084 m  AORTA Ao Root diam: 3.70 cm Ao Asc diam:  3.60 cm MITRAL VALVE MV Area (PHT): 4.69 cm    SHUNTS MV Decel Time: 162 msec    Systemic VTI:  0.08 m MV E velocity: 73.87 cm/s  Systemic Diam: 2.20 cm MV A velocity: 79.13 cm/s MV E/A ratio:  0.93 Mihai  Croitoru MD Electronically signed by Sanda Klein MD Signature Date/Time: 01/11/2022/1:45:55 PM    Final    DG Abd Portable 1V  Result Date: 01/11/2022 CLINICAL DATA:  Abdominal distension and pain. EXAM: PORTABLE ABDOMEN - 1 VIEW COMPARISON:  01/10/2022 FINDINGS: Exam detail is diminished secondary to body habitus. There is been mild increase in caliber of the small bowel loops within the right lower quadrant of the abdomen measuring up to 2.9 cm. Decreased colonic gas compared with the prior exam. IMPRESSION: Mild increase in caliber of the small bowel loops within the right lower quadrant of the abdomen with decrease colonic gas. If there is a clinical concern for developing bowel obstruction advise follow-up serial abdominal imaging to assess change in small bowel caliber. Electronically Signed   By: Kerby Moors M.D.   On: 01/11/2022 13:17   US Abdomen Limited RUQ (LIVER/GB)  Result Date: 01/10/2022 CLINICAL DATA:  Right upper quadrant abdominal pain EXAM: ULTRASOUND ABDOMEN LIMITED RIGHT UPPER QUADRANT COMPARISON:  CT scan 01/10/2022 FINDINGS: Gallbladder: Cholelithiasis noted, individual stones measuring up to 1.4 cm in diameter. Sonographic Murphy's sign absent. Most of the hyperechogenicity between the gallbladder in the adjacent liver  is most likely attributable to adipose tissue given comparison to the CT scan from earlier today; no definite gallbladder wall thickening is observed. Common bile duct: Diameter: 0.5 cm in diameter. The choledocholithiasis shown on the CT from earlier today is not readily apparent on ultrasound. Liver: No focal lesion identified. Coarse echogenic liver with poor sonic penetration compatible with diffuse hepatic steatosis. Portal vein is patent on color Doppler imaging with normal direction of blood flow towards the liver. Limitations include patient difficulty with breath holding or remaining stationary. IMPRESSION: 1. Cholelithiasis without definite sonographic  evidence of acute cholecystitis. 2. Choledocholithiasis with shown on the CT from 12/10/2021 but was not well seen on today's ultrasound. 3. Coarse echogenic liver with poor sonic penetration compatible with diffuse hepatic steatosis. Electronically Signed   By: Van Clines M.D.   On: 01/10/2022 17:51   DG Chest Port 1 View  Result Date: 01/10/2022 CLINICAL DATA:  Concern for sepsis. EXAM: PORTABLE CHEST 1 VIEW COMPARISON:  05/17/2021; 04/09/2021 FINDINGS: Grossly unchanged enlarged cardiac silhouette and mediastinal contours with nodular prominence of the pulmonary hila. Mild pulmonary venous congestion without frank evidence of edema. No focal airspace opacities. No pleural effusion or pneumothorax. Stable position of support apparatus. No acute osseous abnormalities. IMPRESSION: Pulmonary venous congestion without frank evidence of edema on this AP portable examination. No discrete focal airspace opacities to suggest pneumonia. Further evaluation with a PA and lateral chest radiograph may be obtained as clinically indicated. Electronically Signed   By: Sandi Mariscal M.D.   On: 01/10/2022 13:33   CT ABDOMEN PELVIS WO CONTRAST  Result Date: 01/10/2022 CLINICAL DATA:  Abdominal pain, acute, nonlocalized. Patient reports inability to urinate. EXAM: CT ABDOMEN AND PELVIS WITHOUT CONTRAST TECHNIQUE: Multidetector CT imaging of the abdomen and pelvis was performed following the standard protocol without IV contrast. RADIATION DOSE REDUCTION: This exam was performed according to the departmental dose-optimization program which includes automated exposure control, adjustment of the mA and/or kV according to patient size and/or use of iterative reconstruction technique. COMPARISON:  10/19/2021 FINDINGS: Lower chest: Lung bases are clear.  No pleural or pericardial fluid. Hepatobiliary: Liver parenchyma is normal. Small amount of air in the biliary tree, less than seen previously. Few calcified stones dependent  within the gallbladder. No sign of ductal dilatation. However, there is suspicion of a 4 mm stone at the ampulla. Pancreas: Fatty atrophy.  No acute finding. Spleen: Normal Adrenals/Urinary Tract: Right adrenal gland is normal. 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 No pronounced change. No change in appearance of the kidneys. Bilateral cysts for which specific follow-up is not suggested. No hydronephrosis. Bladder is normal. Stomach/Bowel: Stomach and small intestine are normal. Normal appearance of the colon. Vascular/Lymphatic: Aortic atherosclerosis. No aneurysm. No abdominal lymphadenopathy. Small portal region nodes are unchanged. Reproductive: Normal Other: No free fluid or air. Musculoskeletal: No evidence of fracture or lytic destructive bone lesion. IMPRESSION: 1. Few calcified stones dependent within the gallbladder. No sign of ductal dilatation. However, there is suspicion of a 4 mm stone at the ampulla. 2. 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 No pronounced change. 3. Aortic atherosclerosis. 4. No hydronephrosis.  No evidence of bladder outlet obstruction. Aortic Atherosclerosis (ICD10-I70.0). Electronically Signed   By: Nelson Chimes M.D.   On: 01/10/2022 13:28   CT Head Wo Contrast  Result Date: 01/10/2022 CLINICAL DATA:  Mental status change.  Unknown cause. EXAM: CT HEAD WITHOUT CONTRAST TECHNIQUE:  Contiguous axial images were obtained from the base of the skull through the vertex without intravenous contrast. RADIATION DOSE REDUCTION: This exam was performed according to the departmental dose-optimization program which includes automated exposure control, adjustment of the mA and/or kV according to patient size and/or use of iterative reconstruction technique. COMPARISON:  CT examination dated May 17, 2021. FINDINGS: Brain: No evidence of acute infarction, hemorrhage, hydrocephalus, extra-axial  collection or mass lesion/mass effect. Patchy area of low-attenuation of the periventricular white matter presumed chronic microvascular ischemic changes. Vascular: No hyperdense vessel or unexpected calcification. Skull: Normal. Negative for fracture or focal lesion. Sinuses/Orbits: Mucosal thickening and patchy opacification of the ethmoid air cells. Other: None. IMPRESSION: 1. No acute intracranial abnormality. 2. Chronic microvascular ischemic changes of the periventricular white matter. 3. Paranasal sinus disease. Electronically Signed   By: Keane Police D.O.   On: 01/10/2022 13:19    Anti-infectives: Anti-infectives (From admission, onward)    Start     Dose/Rate Route Frequency Ordered Stop   01/11/22 1400  vancomycin (VANCOREADY) IVPB 1500 mg/300 mL  Status:  Discontinued        1,500 mg 150 mL/hr over 120 Minutes Intravenous Every 24 hours 01/10/22 1611 01/11/22 0533   01/11/22 1300  cefTRIAXone (ROCEPHIN) 2 g in sodium chloride 0.9 % 100 mL IVPB        2 g 200 mL/hr over 30 Minutes Intravenous Every 24 hours 01/11/22 0533     01/11/22 1300  vancomycin (VANCOREADY) IVPB 1250 mg/250 mL        1,250 mg 166.7 mL/hr over 90 Minutes Intravenous Every 24 hours 01/11/22 0922     01/10/22 2200  ceFEPIme (MAXIPIME) 2 g in sodium chloride 0.9 % 100 mL IVPB  Status:  Discontinued        2 g 200 mL/hr over 30 Minutes Intravenous Every 8 hours 01/10/22 1521 01/11/22 0533   01/10/22 2200  metroNIDAZOLE (FLAGYL) IVPB 500 mg  Status:  Discontinued        500 mg 100 mL/hr over 60 Minutes Intravenous Every 12 hours 01/10/22 1521 01/11/22 0824   01/10/22 1230  ceFEPIme (MAXIPIME) 2 g in sodium chloride 0.9 % 100 mL IVPB        2 g 200 mL/hr over 30 Minutes Intravenous  Once 01/10/22 1221 01/10/22 1324   01/10/22 1230  metroNIDAZOLE (FLAGYL) IVPB 500 mg        500 mg 100 mL/hr over 60 Minutes Intravenous  Once 01/10/22 1221 01/10/22 1325   01/10/22 1230  vancomycin (VANCOCIN) IVPB 1000 mg/200 mL  premix  Status:  Discontinued        1,000 mg 200 mL/hr over 60 Minutes Intravenous  Once 01/10/22 1221 01/10/22 1228   01/10/22 1230  vancomycin (VANCOREADY) IVPB 2000 mg/400 mL        2,000 mg 200 mL/hr over 120 Minutes Intravenous  Once 01/10/22 1228 01/10/22 1537        Assessment/Plan Cholelithiasis, choledocholithiasis, possible cholangitis   - CT 11/6 with choledocholithiasis - RUQ [place] 11/6 without cholecystitis or definitive choledocholithiasis -LFTs actually down trended today - clinical picture is not definitve for cholecystitis, given improved LFTs today possibly he passed the stone; his white count is up however, will await GI reccs regarding ERCP - continue IV abx  FEN: npo, IVF ID: [email or web address] 11/6> VTE: Lovenox  I reviewed Consultant GI notes, hospitalist notes, last 24 h vitals and pain scores, last 48 h intake and output, last 24 h labs and  trends, and last 24 h imaging results.    LOS: 2 days   Clovis Riley, [facility] 01/12/2022, 8:36 AM Please see Amion for pager number during day hours 7:00am-4:30pm

## 2022-01-13 ENCOUNTER — Inpatient Hospital Stay (HOSPITAL_COMMUNITY): Payer: Medicare Other | Admitting: Certified Registered"

## 2022-01-13 ENCOUNTER — Encounter (HOSPITAL_COMMUNITY): Payer: Self-pay

## 2022-01-13 ENCOUNTER — Inpatient Hospital Stay (HOSPITAL_COMMUNITY): Payer: Medicare Other

## 2022-01-13 ENCOUNTER — Encounter (HOSPITAL_COMMUNITY)
Admission: EM | Disposition: A | Discharge status: Home Health | Payer: Self-pay | Source: Home / Self Care | Attending: Family Medicine

## 2022-01-13 DIAGNOSIS — K8050 Calculus of bile duct without cholangitis or cholecystitis without obstruction: Secondary | ICD-10-CM

## 2022-01-13 DIAGNOSIS — R7881 Bacteremia: Secondary | ICD-10-CM | POA: Insufficient documentation

## 2022-01-13 DIAGNOSIS — Z87891 Personal history of nicotine dependence: Secondary | ICD-10-CM

## 2022-01-13 DIAGNOSIS — J449 Chronic obstructive pulmonary disease, unspecified: Secondary | ICD-10-CM | POA: Diagnosis not present

## 2022-01-13 DIAGNOSIS — R748 Abnormal levels of other serum enzymes: Secondary | ICD-10-CM | POA: Diagnosis not present

## 2022-01-13 DIAGNOSIS — T827XXA Infection and inflammatory reaction due to other cardiac and vascular devices, implants and grafts, initial encounter: Secondary | ICD-10-CM

## 2022-01-13 DIAGNOSIS — R1010 Upper abdominal pain, unspecified: Secondary | ICD-10-CM | POA: Diagnosis not present

## 2022-01-13 DIAGNOSIS — A419 Sepsis, unspecified organism: Secondary | ICD-10-CM | POA: Diagnosis not present

## 2022-01-13 DIAGNOSIS — B9620 Unspecified Escherichia coli [E. coli] as the cause of diseases classified elsewhere: Secondary | ICD-10-CM

## 2022-01-13 DIAGNOSIS — C3491 Malignant neoplasm of unspecified part of right bronchus or lung: Secondary | ICD-10-CM | POA: Diagnosis not present

## 2022-01-13 DIAGNOSIS — C3490 Malignant neoplasm of unspecified part of unspecified bronchus or lung: Secondary | ICD-10-CM

## 2022-01-13 DIAGNOSIS — K269 Duodenal ulcer, unspecified as acute or chronic, without hemorrhage or perforation: Secondary | ICD-10-CM | POA: Diagnosis not present

## 2022-01-13 HISTORY — PX: SPHINCTEROTOMY: SHX5544

## 2022-01-13 HISTORY — PX: ERCP: SHX5425

## 2022-01-13 HISTORY — PX: REMOVAL OF STONES: SHX5545

## 2022-01-13 LAB — CBC WITH DIFFERENTIAL/PLATELET
Abs Immature Granulocytes: 0.07 K/uL (ref 0.00–0.07)
Basophils Absolute: 0 K/uL (ref 0.0–0.1)
Basophils Relative: 0 %
Eosinophils Absolute: 0.3 K/uL (ref 0.0–0.5)
Eosinophils Relative: 3 %
HCT: 36.3 % — ABNORMAL LOW (ref 39.0–52.0)
Hemoglobin: 11.9 g/dL — ABNORMAL LOW (ref 13.0–17.0)
Immature Granulocytes: 1 %
Lymphocytes Relative: 8 %
Lymphs Abs: 0.8 K/uL (ref 0.7–4.0)
MCH: 31.4 pg (ref 26.0–34.0)
MCHC: 32.8 g/dL (ref 30.0–36.0)
MCV: 95.8 fL (ref 80.0–100.0)
Monocytes Absolute: 0.5 K/uL (ref 0.1–1.0)
Monocytes Relative: 5 %
Neutro Abs: 7.9 K/uL — ABNORMAL HIGH (ref 1.7–7.7)
Neutrophils Relative %: 83 %
Platelets: 115 K/uL — ABNORMAL LOW (ref 150–400)
RBC: 3.79 MIL/uL — ABNORMAL LOW (ref 4.22–5.81)
RDW: 13.6 % (ref 11.5–15.5)
WBC: 9.6 K/uL (ref 4.0–10.5)
nRBC: 0 % (ref 0.0–0.2)

## 2022-01-13 LAB — HEPATIC FUNCTION PANEL
ALT: 123 U/L — ABNORMAL HIGH (ref 0–44)
AST: 75 U/L — ABNORMAL HIGH (ref 15–41)
Albumin: 2.7 g/dL — ABNORMAL LOW (ref 3.5–5.0)
Alkaline Phosphatase: 90 U/L (ref 38–126)
Bilirubin, Direct: 0.8 mg/dL — ABNORMAL HIGH (ref 0.0–0.2)
Indirect Bilirubin: 1 mg/dL — ABNORMAL HIGH (ref 0.3–0.9)
Total Bilirubin: 1.8 mg/dL — ABNORMAL HIGH (ref 0.3–1.2)
Total Protein: 6 g/dL — ABNORMAL LOW (ref 6.5–8.1)

## 2022-01-13 LAB — BASIC METABOLIC PANEL WITH GFR
Anion gap: 6 (ref 5–15)
BUN: 15 mg/dL (ref 8–23)
CO2: 25 mmol/L (ref 22–32)
Calcium: 8.4 mg/dL — ABNORMAL LOW (ref 8.9–10.3)
Chloride: 111 mmol/L (ref 98–111)
Creatinine, Ser: 1.03 mg/dL (ref 0.61–1.24)
GFR, Estimated: >60 mL/min
Glucose, Bld: 98 mg/dL (ref 70–99)
Potassium: 3.5 mmol/L (ref 3.5–5.1)
Sodium: 142 mmol/L (ref 135–145)

## 2022-01-13 LAB — PROTIME-INR
INR: 1.1 (ref 0.8–1.2)
Prothrombin Time: 13.8 seconds (ref 11.4–15.2)

## 2022-01-13 LAB — URINE CULTURE: Culture: 3000 — AB

## 2022-01-13 SURGERY — ERCP, WITH INTERVENTION IF INDICATED
Anesthesia: General

## 2022-01-13 MED ORDER — FENTANYL CITRATE (PF) 100 MCG/2ML IJ SOLN
INTRAMUSCULAR | Status: AC
  Filled 2022-01-13: qty 2

## 2022-01-13 MED ORDER — DEXAMETHASONE SODIUM PHOSPHATE 10 MG/ML IJ SOLN
INTRAMUSCULAR | Status: DC | PRN
  Administered 2022-01-13: 10 mg via INTRAVENOUS

## 2022-01-13 MED ORDER — ONDANSETRON HCL 4 MG/2ML IJ SOLN
INTRAMUSCULAR | Status: DC | PRN
  Administered 2022-01-13: 4 mg via INTRAVENOUS

## 2022-01-13 MED ORDER — MIDAZOLAM HCL 2 MG/2ML IJ SOLN
INTRAMUSCULAR | Status: AC
  Filled 2022-01-13: qty 2

## 2022-01-13 MED ORDER — PROPOFOL 10 MG/ML IV BOLUS
INTRAVENOUS | Status: DC | PRN
  Administered 2022-01-13: 100 mg via INTRAVENOUS

## 2022-01-13 MED ORDER — FENTANYL CITRATE (PF) 100 MCG/2ML IJ SOLN
INTRAMUSCULAR | Status: DC | PRN
  Administered 2022-01-13 (×2): 50 ug via INTRAVENOUS

## 2022-01-13 MED ORDER — SUGAMMADEX SODIUM 500 MG/5ML IV SOLN
INTRAVENOUS | Status: DC | PRN
  Administered 2022-01-13: 300 mg via INTRAVENOUS

## 2022-01-13 MED ORDER — GLUCAGON HCL RDNA (DIAGNOSTIC) 1 MG IJ SOLR
INTRAMUSCULAR | Status: AC
  Filled 2022-01-13: qty 2

## 2022-01-13 MED ORDER — INDOMETHACIN 50 MG RE SUPP
RECTAL | Status: DC | PRN
  Administered 2022-01-13: 100 mg via RECTAL

## 2022-01-13 MED ORDER — INDOMETHACIN 50 MG RE SUPP: 100.0000 mg | Freq: Once | RECTAL | Status: DC

## 2022-01-13 MED ORDER — PROPOFOL 10 MG/ML IV BOLUS
INTRAVENOUS | Status: AC
  Filled 2022-01-13: qty 20

## 2022-01-13 MED ORDER — ROCURONIUM BROMIDE 10 MG/ML (PF) SYRINGE
PREFILLED_SYRINGE | INTRAVENOUS | Status: DC | PRN
  Administered 2022-01-13: 70 mg via INTRAVENOUS

## 2022-01-13 MED ORDER — SODIUM CHLORIDE 0.9 % IV SOLN
INTRAVENOUS | Status: DC | PRN
  Administered 2022-01-13: 56 mL

## 2022-01-13 MED ORDER — SODIUM CHLORIDE 0.9 % IV SOLN: INTRAVENOUS | Status: DC

## 2022-01-13 MED ORDER — INDOMETHACIN 50 MG RE SUPP
RECTAL | Status: AC
  Filled 2022-01-13: qty 2

## 2022-01-13 MED ORDER — LACTATED RINGERS IV SOLN: INTRAVENOUS | Status: DC

## 2022-01-13 MED ORDER — LIDOCAINE 2% (20 MG/ML) 5 ML SYRINGE
INTRAMUSCULAR | Status: DC | PRN
  Administered 2022-01-13: 60 mg via INTRAVENOUS

## 2022-01-13 MED ORDER — SODIUM CHLORIDE 0.9 % IV SOLN
3.0000 g | Freq: Four times a day (QID) | INTRAVENOUS | Status: DC
  Administered 2022-01-13 – 2022-01-22 (×36): 3 g via INTRAVENOUS
  Filled 2022-01-13 (×36): qty 8

## 2022-01-13 NOTE — Progress Notes (Signed)
Pharmacy Antibiotic Note  [name] [name] is a 66 y.o. male with metastatic lung cancer found to have e coli and enterococcus casseliflavus bacteremia from a possible GI source. He presented to the ED on 01/10/2022 with c/o fever, n/v and abdominal pain. LFTs were found to be elevated. Abd CT and US showed gallstones and choledocholithiasis without biliary dilation.  There are no plans for surgery at this time.   Patient was briefly on Zosyn for empiric e coli coverage, but sensitivities now show susceptibility to Unasyn.  Enterococcus casseliflavus is typically sensitive to Unasyn. Patient afebrile, HR and BP within normal limits.   Plan: Start Unasyn 3g q6 hours, stop Zosyn  Monitor culture data and clinical progress   Height: 5\' 9"  (175.3 cm) Weight: 107.3 kg (236 lb 8.9 oz) IBW/kg (Calculated) : 70.7  Temp (24hrs), Avg:98.3 F (36.8 C), Min:97.5 F (36.4 C), Max:99 F (37.2 C)  Recent Labs  Lab 01/10/22 1218 01/10/22 1219 01/10/22 1448 01/10/22 1850 01/11/22 0438 01/12/22 0324 01/13/22 0345  WBC  --  4.5  --   --  9.5 12.1* 9.6  CREATININE  --  1.35*  --  1.23 1.31* 1.17 1.03  LATICACIDVEN 2.9*  --  2.4* 2.0*  --   --   --      Estimated Creatinine Clearance: 85.1 mL/min (by C-G formula based on SCr of 1.03 mg/dL).    Allergies  Allergen Reactions   Fish Allergy Nausea And Vomiting   Iohexol Other (See Comments)     Code: HIVES, Desc: PT STATES HE HAD AN IVP 15 YRS AGO AND HAD SOB AND BROKE OUT IN HIVES., Onset Date: [id number]    Ivp Dye [Iodinated Contrast Media] Other (See Comments)    intolerance    Thank you for allowing pharmacy to be a part of this patient's care.  Vicenta Dunning, PharmD  PGY1 Pharmacy Resident

## 2022-01-13 NOTE — Anesthesia Preprocedure Evaluation (Addendum)
Anesthesia Evaluation  Patient identified by MRN, date of birth, ID band Patient awake    Reviewed: Allergy & Precautions, NPO status , Patient's Chart, lab work & pertinent test results  Airway Mallampati: II  TM Distance: >3 FB Neck ROM: Full    Dental  (+) Dental Advisory Given, Edentulous Upper   Pulmonary shortness of breath, with exertion and at rest, pneumonia, resolved, COPD,  COPD inhaler, former smoker Hx/o lung Ca   breath sounds clear to auscultation + decreased breath sounds      Cardiovascular hypertension, Pt. on medications + CAD, + Past MI, + Cardiac Stents, +CHF, + Orthopnea and + DOE   Rhythm:Regular Rate:Normal  Echo: 1. Left ventricular ejection fraction, by estimation, is 60 to 65%. The  left ventricle has normal function. The left ventricle has no regional  wall motion abnormalities. Left ventricular diastolic parameters are  indeterminate.   2. Right ventricular systolic function is normal. The right ventricular  size is normal. Tricuspid regurgitation signal is inadequate for assessing  PA pressure.   3. Left atrial size was mildly dilated.   4. Right atrial size was mildly dilated.   5. The mitral valve is grossly normal. No evidence of mitral valve  regurgitation.   6. The aortic valve was not well visualized. Aortic valve regurgitation  is not visualized. No aortic stenosis is present.   7. The inferior vena cava is normal in size with greater than 50%  respiratory variability, suggesting right atrial pressure of 3 mmHg.    Hx/o Inf STEMI S/P DES RCA Stents Lcx and LAD   Neuro/Psych  Headaches PSYCHIATRIC DISORDERS Anxiety Depression     Neuromuscular disease    GI/Hepatic Neg liver ROS,GERD  Medicated,,Ascending cholangitis Hx/o choledocholithiasis S/P ERCP with sphincterotomy   Endo/Other  negative endocrine ROS    Renal/GU negative Renal ROS     Musculoskeletal  (+) Arthritis ,   Fibromyalgia -, narcotic dependent  Abdominal  (+) + obese  Peds  Hematology  (+) Blood dyscrasia, anemia Lovenox therapy- last dose 11/7   Anesthesia Other Findings   Reproductive/Obstetrics                              Anesthesia Physical Anesthesia Plan  ASA: 3  Anesthesia Plan: General   Post-op Pain Management:    Induction: Intravenous, Rapid sequence and Cricoid pressure planned  PONV Risk Score and Plan: 2 and Ondansetron and Midazolam  Airway Management Planned: Oral ETT  Additional Equipment: None  Intra-op Plan:   Post-operative Plan: Extubation in OR  Informed Consent: I have reviewed the patients History and Physical, chart, labs and discussed the procedure including the risks, benefits and alternatives for the proposed anesthesia with the patient or authorized representative who has indicated his/her understanding and acceptance.     Dental advisory given  Plan Discussed with: CRNA and Anesthesiologist  Anesthesia Plan Comments:          Anesthesia Quick Evaluation

## 2022-01-13 NOTE — Consult Note (Signed)
[facility]    Date of Admission:  01/10/2022     Reason for Consult:Bacteremia     Referring Physician: Dr Lonny Prude  Current antibiotics: Zosyn   ASSESSMENT:    66 y.o. 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  Currently on observation therapy  RECOMMENDATIONS:    Patient transitioned to Zosyn given intrinsic vancomycin resistance with Enterococcus casseliflavus which also covers the E coli isolate.   Will transition to Unasyn based on E coli sensitivities Suspect source due to biliary system given overall presentation Awaiting MRCP, possible ERCP Repeat blood cultures Would recommend PAC removal in this setting I think endocarditis is less likely but would recommend we get TEE as this would significantly change antibiotic plan Following   Principal Problem:   Sepsis due to undetermined organism [facility]) Active Problems:   CAD S/P percutaneous coronary angioplasty   Essential hypertension   Dyslipidemia, goal LDL below 70   Chronic diastolic congestive heart failure (HCC)   Non-small cell carcinoma of right lung, stage 4 (HCC)   Thrombocytopenia (HCC)   COPD (chronic obstructive pulmonary disease) (HCC)   Abdominal pain   Cholelithiasis   Abnormal liver enzymes   MEDICATIONS:    Scheduled Meds:  Chlorhexidine Gluconate Cloth  6 each Topical Daily   lidocaine  1 patch Transdermal Q24H   morphine  60 mg Oral Q12H   mouth rinse  15 mL Mouth Rinse 4 times per day   sodium chloride flush  10-40 mL Intracatheter Q12H   Continuous Infusions:  ampicillin-sulbactam (UNASYN) IV     PRN Meds:.levalbuterol, LORazepam, naloxone **AND** sodium chloride flush, mouth rinse, mouth rinse, sodium chloride flush  HPI:    [name] [name] is a 66 y.o. male with PMHx as noted below who  presented on 01/10/22 with abdominal pain, nausea, vomiting.  He met sepsis criteria on admission and subsequently found to have E coli and Enterococcus casseliflavus bacteremia.  He is currently on Zosyn to provide adequate coverage.  His labs have shown elevated liver enzymes (improving) and CT scan notable for suspicion of a 15m stone at the ampulla.  GI and general surgery following.  There is plan for possible ERCP today after MRCP that is pending. Complicating his bacteremia is the presence of a Port-A-Cath utilized from which cultures were drawn that grew the above organisms.  Peripheral cultures also grew Enterococcus.  TTE was done but of poor quality and did not visualize the AV well.   Past Medical History:  Diagnosis Date   Adenomatous colon polyp    Anxiety    Anxiety disorder    Arthritis    "everywhere"    CAD S/P percutaneous coronary angioplasty 2006, 5/'18   a). 2006: Taxus DES to RCA; March '06 Cypher DES to LAD; EF 66%, LV gram normal;;. b). 07/2016: Inferior STEMI with 100% mRCA (Aspiration Thrombectomy & DES PCI Promus 3.584mx 38 mm). Patent LAD stent and patent LM and LCx.    Chronic pain syndrome    , as noted by handwritten note from primary physician    COPD (chronic obstructive pulmonary disease) ([facility]   Depression    Dyslipidemia, goal LDL below 70    Dyspnea    with exertion, no oxygen   Fibromyalgia    FRACTURE, RIB, LEFT 11/15/2006   Qualifier: Diagnosis of  By: Drinkard MSN, FNP-C,  Collie Siad     GERD (gastroesophageal reflux disease)    on occas. uses TUMS for heartburn    Headache    pt. remarks that he gets sinus headaches    History of migraine headaches    Hypertension, essential, benign    Lung cancer ([place])    Lung nodule 09/2020   right lower lobe   Neuromuscular disorder (HCC)    L leg, nerve damage    Obesity, Class II, BMI 35-39.9    Pneumonia 2015   x 3   Tobacco abuse     Social History   Tobacco Use   Smoking status: Former     Packs/day: 1.00    Years: 44.00    Total pack years: 44.00    Types: Cigarettes    Start date: 26    Quit date: 08/10/2016    Years since quitting: 5.4   Smokeless tobacco: Never   Tobacco comments:    Started smoking at age 4  Vaping Use   Vaping Use: Never used  Substance Use Topics   Alcohol use: Not Currently    Comment: stopped in 1995   Drug use: No    Family History  Problem Relation Age of Onset   COPD Father 74        cause of death Described as breathing problems   Heart failure Mother        Currently 53   Throat cancer Brother        long-term smoker   Cancer - Cervical Sister        Reported is gynecologic cancer   Leukemia Sister     Allergies  Allergen Reactions   Fish Allergy Nausea And Vomiting   Iohexol Other (See Comments)     Code: HIVES, Desc: PT STATES HE HAD AN IVP 15 YRS AGO AND HAD SOB AND BROKE OUT IN HIVES., Onset Date: [id number]    Ivp Dye [Iodinated Contrast Media] Other (See Comments)    intolerance    Review of Systems  All other systems reviewed and are negative. Except as noted above.   OBJECTIVE:   Blood pressure 118/67, pulse 78, temperature (!) 97.5 F (36.4 C), temperature source Axillary, resp. rate 17, height _0  (1.753 m), weight 107.3 kg, SpO2 99 %. Body mass index is 34.93 kg/m.  Physical Exam Constitutional:      Comments: Frail, ill appearing man, NAD, lying in bed.   HENT:     Head: Normocephalic and atraumatic.  Eyes:     Extraocular Movements: Extraocular movements intact.     Conjunctiva/sclera: Conjunctivae normal.  Cardiovascular:     Rate and Rhythm: Normal rate and regular rhythm.     Comments: Right sided PAC in place.  Pulmonary:     Effort: Pulmonary effort is normal. No respiratory distress.     Comments: On nasal cannula, diminished at bases.  Abdominal:     General: There is no distension.     Palpations: Abdomen is soft.     Tenderness: There is no abdominal tenderness. There is no  guarding.  Musculoskeletal:        General: Normal range of motion.     Cervical back: Normal range of motion and neck supple.  Skin:    General: Skin is warm and dry.     Coloration: Skin is not jaundiced.  Neurological:     General: No focal deficit present.     Mental Status: He is oriented to person, place, and  time.      Lab Results: Lab Results  Component Value Date   WBC 9.6 01/13/2022   HGB 11.9 (L) 01/13/2022   HCT 36.3 (L) 01/13/2022   MCV 95.8 01/13/2022   PLT 115 (L) 01/13/2022    Lab Results  Component Value Date   NA 142 01/13/2022   K 3.5 01/13/2022   CO2 25 01/13/2022   GLUCOSE 98 01/13/2022   BUN 15 01/13/2022   CREATININE 1.03 01/13/2022   CALCIUM 8.4 (L) 01/13/2022   GFRNONAA >60 01/13/2022   GFRAA >60 01/16/2017    Lab Results  Component Value Date   ALT 123 (H) 01/13/2022   AST 75 (H) 01/13/2022   ALKPHOS 90 01/13/2022   BILITOT 1.8 (H) 01/13/2022    No results found for: "CRP"  No results found for: "ESRSEDRATE"  I have reviewed the micro and lab results in Epic.  Imaging: ECHOCARDIOGRAM COMPLETE  Result Date: 01/11/2022    ECHOCARDIOGRAM REPORT   Patient Name:   HERSCHEL FLEAGLE Date of Exam: 01/11/2022 Medical Rec #:  [id number]      Height:       69.0 in Accession #:    [id number]     Weight:       236.6 lb Date of Birth:  1955/06/03      BSA:          2.219 m Patient Age:    55 years       BP:           118/64 mmHg Patient Gender: M              HR:           102 bpm. Exam Location:  Inpatient Procedure: 2D Echo, Cardiac Doppler, Color Doppler and Intracardiac            Opacification Agent Indications:    I25.110 Atherosclerotic heart disease of native coronary artery                 with unstable angina pectoris; I50.40* Unspecified combined                 systolic (congestive) and diastolic (congestive) heart failure  History:        Patient has prior history of Echocardiogram examinations, most                 recent 07/22/2019. CAD and  Previous Myocardial Infarction,                 Signs/Symptoms:Dyspnea and Bacteremia; Risk Factors:Hypertension                 and Current Smoker. Chemo. Lung cancer.  Sonographer:    Roseanna Rainbow RDCS Referring Phys: [id number] [place]  Sonographer Comments: Technically difficult study due to poor echo windows, suboptimal apical window, suboptimal parasternal window, Technically challenging study due to limited acoustic windows, suboptimal subcostal window and patient is obese. Image acquisition challenging due to patient body habitus and Image acquisition challenging due to respiratory motion. Extremely difficult due to respiratory motion. Could not turn patient due to extreme pain and restraint belt. IMPRESSIONS  1. Left ventricular ejection fraction, by estimation, is 60 to 65%. The left ventricle has normal function. The left ventricle has no regional wall motion abnormalities. Left ventricular diastolic parameters are indeterminate.  2. Right ventricular systolic function is normal. The right ventricular size is normal. Tricuspid regurgitation signal is inadequate for assessing PA pressure.  3. Left atrial size was mildly dilated.  4. Right atrial size was mildly dilated.  5. The mitral valve is grossly normal. No evidence of mitral valve regurgitation.  6. The aortic valve was not well visualized. Aortic valve regurgitation is not visualized. No aortic stenosis is present.  7. The inferior vena cava is normal in size with greater than 50% respiratory variability, suggesting right atrial pressure of 3 mmHg. Comparison(s): There is prominent respiration related variation in mitral inflow velocities and ventricular septal displacement (but no other findings to support constrictive or tamponade physiology). Consider increased work of breathing due to asthma/COPD exacerbation. FINDINGS  Left Ventricle: Left ventricular ejection fraction, by estimation, is 60 to 65%. The left ventricle has normal function.  The left ventricle has no regional wall motion abnormalities. Definity contrast agent was given IV to delineate the left ventricular  endocardial borders. The left ventricular internal cavity size was normal in size. There is borderline concentric left ventricular hypertrophy. Left ventricular diastolic parameters are indeterminate. Right Ventricle: The right ventricular size is normal. No increase in right ventricular wall thickness. Right ventricular systolic function is normal. Tricuspid regurgitation signal is inadequate for assessing PA pressure. Left Atrium: Left atrial size was mildly dilated. Right Atrium: Right atrial size was mildly dilated. Pericardium: The pericardium was not well visualized. Presence of epicardial fat layer. Mitral Valve: The mitral valve is grossly normal. No evidence of mitral valve regurgitation. Tricuspid Valve: The tricuspid valve is not well visualized. Tricuspid valve regurgitation is not demonstrated. Aortic Valve: The aortic valve was not well visualized. Aortic valve regurgitation is not visualized. No aortic stenosis is present. Pulmonic Valve: The pulmonic valve was not well visualized. Pulmonic valve regurgitation is not visualized. Aorta: The ascending aorta was not well visualized. Venous: The inferior vena cava is normal in size with greater than 50% respiratory variability, suggesting right atrial pressure of 3 mmHg. IAS/Shunts: No atrial level shunt detected by color flow Doppler.  LEFT VENTRICLE PLAX 2D LVIDd:         5.60 cm     Diastology LVIDs:         3.70 cm     LV e' medial:    8.27 cm/s LV PW:         1.20 cm     LV E/e' medial:  8.9 LV IVS:        1.10 cm     LV e' lateral:   7.40 cm/s LVOT diam:     2.20 cm     LV E/e' lateral: 10.0 LV SV:         32 LV SV Index:   14 LVOT Area:     3.80 cm  LV Volumes (MOD) LV vol d, MOD A2C: 53.6 ml LV vol d, MOD A4C: 69.9 ml LV vol s, MOD A2C: 20.2 ml LV vol s, MOD A4C: 27.0 ml LV SV MOD A2C:     33.4 ml LV SV MOD A4C:      69.9 ml LV SV MOD BP:      40.7 ml RIGHT VENTRICLE            IVC RV S prime:     6.96 cm/s  IVC diam: 2.10 cm TAPSE (M-mode): 1.9 cm LEFT ATRIUM           Index        RIGHT ATRIUM           Index LA diam:      3.90  cm 1.76 cm/m   RA Area:     20.30 cm LA Vol (A2C): 11.3 ml 5.09 ml/m   RA Volume:   60.70 ml  27.36 ml/m LA Vol (A4C): 49.4 ml 22.26 ml/m  AORTIC VALVE LVOT Vmax:   60.30 cm/s LVOT Vmean:  37.900 cm/s LVOT VTI:    0.084 m  AORTA Ao Root diam: 3.70 cm Ao Asc diam:  3.60 cm MITRAL VALVE MV Area (PHT): 4.69 cm    SHUNTS MV Decel Time: 162 msec    Systemic VTI:  0.08 m MV E velocity: 73.87 cm/s  Systemic Diam: 2.20 cm MV A velocity: 79.13 cm/s MV E/A ratio:  0.93 Mihai Croitoru MD Electronically signed by Sanda Klein MD Signature Date/Time: 01/11/2022/1:45:55 PM    Final    DG Abd Portable 1V  Result Date: 01/11/2022 CLINICAL DATA:  Abdominal distension and pain. EXAM: PORTABLE ABDOMEN - 1 VIEW COMPARISON:  01/10/2022 FINDINGS: Exam detail is diminished secondary to body habitus. There is been mild increase in caliber of the small bowel loops within the right lower quadrant of the abdomen measuring up to 2.9 cm. Decreased colonic gas compared with the prior exam. IMPRESSION: Mild increase in caliber of the small bowel loops within the right lower quadrant of the abdomen with decrease colonic gas. If there is a clinical concern for developing bowel obstruction advise follow-up serial abdominal imaging to assess change in small bowel caliber. Electronically Signed   By: Kerby Moors M.D.   On: 01/11/2022 13:17     Imaging independently reviewed in Epic.  [facility] [phone] pager 01/13/2022, 10:18 AM

## 2022-01-13 NOTE — Progress Notes (Signed)
PROGRESS NOTE    [name] [name]  WER:[id number] DOB: 04-Jan-1956 DOA: 01/10/2022 PCP: [name], MD   Brief Narrative: [name] [name] is a 66 y.o. male with a history of depression, anxiety, chronic pain, osteoarthritis, COPD, hyperlipidemia, fibromyalgia, GERD, migraine, hypertension, obesity. Patient presented secondary to abdominal pain, nausea and vomiting with evidence of a CBD stone. Patient met sepsis criteria on admission and was found to have an E. Coli and Enterococcus casseliflavus bacteremia. Antibiotics tailored to cover bacteremia. GI plan for ERCP.   Assessment and Plan:  Choledocholithiasis Elevated AST/ALT with CT imaging significant for suspicion of a 4 mm stone at the ampulla. GI and general surgery consulted. AST/ALT/bilirubin trending down. -GI recommendations: MRCP today prior to possible ERCP -General surgery recommendations: planning possible laparoscopic cholecystectomy  Sepsis Present on admission. Secondary to E. Coli and enterococcus casseliflavus bacteremia likely related to above source. Antibiotics as mentioned below.  E. Coli bacteremia Enterococcus casseliflavus bacteremia Patient empirically managed on Vancomycin, Flagyl and Cefepime. He was transitioned to Ceftriaxone and Vancomycin for E. Coli and GPCs noted on culture data and now transitioned to Zosyn with final identification of bacteria. ID consulted. -Continue Zosyn IV per ID -Follow-up ID recommendations  Acute metabolic encephalopathy Presumed secondary to sepsis. Improving.  Chronic pain Patient's home regimen held on admission and patient started on a dilaudid PCA pump. -Continue MS Contin -Continue dilaudid PRN  Stage IV non-small cell right lung cancer Noted. Patient under observation.  CAD History of PCI of RCA in 2018. Currently on Brilinta which is held secondary to plan for ERCP.  Chronic diastolic heart failure Noted. Currently without acute heart failure. Home  Lasix held. -Daily weights and strict in/out  Primary hypertension Patient appears to be managed on diuretic monotherapy. Lasix held secondary to sepsis.  Hyperlipidemia Patient is on Crestor as an outpatient which was held on admission. Elevated AST/ALT secondary to choledocholithiasis -Continue to hold Crestor for now  COPD -Continue Xopenex PRN  Thrombocytopenia Downtrending. Likely secondary to sepsis. -Trend CBC  DVT prophylaxis: Lovenox Code Status:   Code Status: Full Code Family Communication: None at bedside Disposition Plan: Discharge home likely in 3-5 days pending improvement of mental status, transition to outpatient antibiotic regimen, specialist recommendations/management   Consultants:  General surgery Gastroenterology Infectious disease  Procedures:  None  Antimicrobials: Vancomycin Cefepime Flagyl Ceftriaxone Zosyn    Subjective: Abdominal pain still present. Patient states he feels the stone is still stuck. No nausea or vomiting.  Objective: BP 118/67 (BP Location: Right Arm)   Pulse 78   Temp (!) 97.5 F (36.4 C) (Axillary)   Resp 17   Ht _0  (1.753 m)   Wt 107.3 kg   SpO2 99%   BMI 34.93 kg/m   Examination:  General exam: Appears calm and comfortable Respiratory system: Clear to auscultation. Respiratory effort normal. Cardiovascular system: S1 & S2 heard, RRR. No murmurs, rubs, gallops or clicks. Gastrointestinal system: Abdomen is nondistended, soft and tender in right upper quadrant. Normal bowel sounds heard. Central nervous system: Alert and oriented to person and place. Skin: No cyanosis. No rashes   Data Reviewed: I have personally reviewed following labs and imaging studies  CBC Lab Results  Component Value Date   WBC 9.6 01/13/2022   RBC 3.79 (L) 01/13/2022   HGB 11.9 (L) 01/13/2022   HCT 36.3 (L) 01/13/2022   MCV 95.8 01/13/2022   MCH 31.4 01/13/2022   PLT 115 (L) 01/13/2022   MCHC 32.8 01/13/2022  RDW 13.6  01/13/2022   LYMPHSABS 0.8 01/13/2022   MONOABS 0.5 01/13/2022   EOSABS 0.3 01/13/2022   BASOSABS 0.0 08/28/7626     Last metabolic panel Lab Results  Component Value Date   NA 142 01/13/2022   K 3.5 01/13/2022   CL 111 01/13/2022   CO2 25 01/13/2022   BUN 15 01/13/2022   CREATININE 1.03 01/13/2022   GLUCOSE 98 01/13/2022   GFRNONAA >60 01/13/2022   GFRAA >60 01/16/2017   CALCIUM 8.4 (L) 01/13/2022   PHOS 3.8 02/08/2021   PROT 6.0 (L) 01/13/2022   ALBUMIN 2.7 (L) 01/13/2022   BILITOT 1.8 (H) 01/13/2022   ALKPHOS 90 01/13/2022   AST 75 (H) 01/13/2022   ALT 123 (H) 01/13/2022   ANIONGAP 6 01/13/2022    GFR: Estimated Creatinine Clearance: 85.1 mL/min (by C-G formula based on SCr of 1.03 mg/dL).  Recent Results (from the past 240 hour(s))  Blood Culture (routine x 2)     Status: Abnormal (Preliminary result)   Collection Time: 01/10/22 12:18 PM   Specimen: BLOOD  Result Value Ref Range Status   Specimen Description   Final    BLOOD LEFT ANTECUBITAL Performed at [facility] [address], [place], [place] [address]    Special Requests   Final    BOTTLES DRAWN AEROBIC AND ANAEROBIC Blood Culture adequate volume Performed at [facility] [address] [place], [place] [address]    Culture  Setup Time   Final    GRAM POSITIVE COCCI IN PAIRS IN BOTH AEROBIC AND ANAEROBIC BOTTLES CRITICAL VALUE NOTED.  VALUE IS CONSISTENT WITH PREVIOUSLY REPORTED AND CALLED VALUE.    Culture (A)  Final    ENTEROCOCCUS CASSELIFLAVUS SUSCEPTIBILITIES TO FOLLOW REPEATING Performed at [facility], [place] [address] [place], [place] [address]    Report Status PENDING  Incomplete  Blood Culture (routine x 2)     Status: Abnormal (Preliminary result)   Collection Time: 01/10/22 12:36 PM   Specimen: BLOOD  Result Value Ref Range Status   Specimen Description BLOOD PORTA CATH  Final   Special Requests   Final    BOTTLES DRAWN AEROBIC AND  ANAEROBIC Blood Culture adequate volume   Culture  Setup Time   Final    GRAM POSITIVE COCCI IN PAIRS GRAM NEGATIVE RODS IN BOTH AEROBIC AND ANAEROBIC BOTTLES CRITICAL RESULT CALLED TO, READ BACK BY AND VERIFIED WITH: PHARMD D. POINDEXTER 01/11/22 _0  BY AB    Culture (A)  Final    ESCHERICHIA COLI CORRECTED RESULTS ENTEROCOCCUS CASSELIFLAVUS PREVIOUSLY REPORTED AS: ENTEROCOCCUS FAECALIS CORRECTED RESULTS CALLED TO: PHARMD Mitzie Na [phone] FCP    Report Status PENDING  Incomplete   Organism ID, Bacteria ESCHERICHIA COLI  Final      Susceptibility   Escherichia coli - MIC*    AMPICILLIN 8 SENSITIVE Sensitive     CEFAZOLIN <=4 SENSITIVE Sensitive     CEFEPIME <=0.12 SENSITIVE Sensitive     CEFTAZIDIME <=1 SENSITIVE Sensitive     CEFTRIAXONE <=0.25 SENSITIVE Sensitive     CIPROFLOXACIN <=0.25 SENSITIVE Sensitive     GENTAMICIN <=1 SENSITIVE Sensitive     IMIPENEM <=0.25 SENSITIVE Sensitive     TRIMETH/SULFA <=20 SENSITIVE Sensitive     AMPICILLIN/SULBACTAM 4 SENSITIVE Sensitive     PIP/TAZO Value in next row Sensitive      <=4 SENSITIVEPerformed at [facility] [address], [place], [place] [address]    * ESCHERICHIA COLI  Blood Culture ID Panel (Reflexed)     Status: Abnormal   Collection Time: 01/10/22 12:36 PM  Result Value Ref Range Status   Enterococcus faecalis NOT DETECTED NOT DETECTED Final   Enterococcus Faecium NOT DETECTED NOT DETECTED Final   Listeria monocytogenes NOT DETECTED NOT DETECTED Final   Staphylococcus species NOT DETECTED NOT DETECTED Final   Staphylococcus aureus (BCID) NOT DETECTED NOT DETECTED Final   Staphylococcus epidermidis NOT DETECTED NOT DETECTED Final   Staphylococcus lugdunensis NOT DETECTED NOT DETECTED Final   Streptococcus species NOT DETECTED NOT DETECTED Final   Streptococcus agalactiae NOT DETECTED NOT DETECTED Final   Streptococcus pneumoniae NOT DETECTED NOT DETECTED Final   Streptococcus pyogenes NOT DETECTED NOT  DETECTED Final   A.calcoaceticus-baumannii NOT DETECTED NOT DETECTED Final   Bacteroides fragilis NOT DETECTED NOT DETECTED Final   Enterobacterales DETECTED (A) NOT DETECTED Final    Comment: Enterobacterales represent a large order of gram negative bacteria, not a single organism. CRITICAL RESULT CALLED TO, READ BACK BY AND VERIFIED WITH: PHARMD D. POINDEXTER 01/11/22 _0  BY AB    Enterobacter cloacae complex NOT DETECTED NOT DETECTED Final   Escherichia coli DETECTED (A) NOT DETECTED Final    Comment: CRITICAL RESULT CALLED TO, READ BACK BY AND VERIFIED WITH: PHARMD D. POINDEXTER 01/11/22 _1  BY AB    Klebsiella aerogenes NOT DETECTED NOT DETECTED Final   Klebsiella oxytoca NOT DETECTED NOT DETECTED Final   Klebsiella pneumoniae NOT DETECTED NOT DETECTED Final   Proteus species NOT DETECTED NOT DETECTED Final   Salmonella species NOT DETECTED NOT DETECTED Final   Serratia marcescens NOT DETECTED NOT DETECTED Final   Haemophilus influenzae NOT DETECTED NOT DETECTED Final   Neisseria meningitidis NOT DETECTED NOT DETECTED Final   Pseudomonas aeruginosa NOT DETECTED NOT DETECTED Final   Stenotrophomonas maltophilia NOT DETECTED NOT DETECTED Final   Candida albicans NOT DETECTED NOT DETECTED Final   Candida auris NOT DETECTED NOT DETECTED Final   Candida glabrata NOT DETECTED NOT DETECTED Final   Candida krusei NOT DETECTED NOT DETECTED Final   Candida parapsilosis NOT DETECTED NOT DETECTED Final   Candida tropicalis NOT DETECTED NOT DETECTED Final   Cryptococcus neoformans/gattii NOT DETECTED NOT DETECTED Final   CTX-M ESBL NOT DETECTED NOT DETECTED Final   Carbapenem resistance IMP NOT DETECTED NOT DETECTED Final   Carbapenem resistance KPC NOT DETECTED NOT DETECTED Final   Carbapenem resistance NDM NOT DETECTED NOT DETECTED Final   Carbapenem resist OXA 48 LIKE NOT DETECTED NOT DETECTED Final   Carbapenem resistance VIM NOT DETECTED NOT DETECTED Final    Comment: Performed at  [facility], [address], [place], [place] [address]  Resp Panel by RT-PCR (Flu A&B, Covid)     Status: None   Collection Time: 01/10/22  1:40 PM   Specimen: Nasal Swab  Result Value Ref Range Status   SARS Coronavirus 2 by RT PCR NEGATIVE NEGATIVE Final    Comment: (NOTE) SARS-CoV-2 target nucleic acids are NOT DETECTED.  The SARS-CoV-2 RNA is generally detectable in upper respiratory specimens during the acute phase of infection. The lowest concentration of SARS-CoV-2 viral copies this assay can detect is 138 copies/mL. A negative result does not preclude SARS-Cov-2 infection and should not be used as the sole basis for treatment or other patient management decisions. A negative result may occur with  improper specimen collection/handling, submission of specimen other than nasopharyngeal swab, presence of viral mutation(s) within the areas targeted by this assay, and inadequate number  of viral copies(<138 copies/mL). A negative result must be combined with clinical observations, patient history, and epidemiological information. The expected result is Negative.  Fact Sheet for Patients:  EntrepreneurPulse.com.au  Fact Sheet for Healthcare Providers:  IncredibleEmployment.be  This test is no t yet approved or cleared by the [place] FDA and  has been authorized for detection and/or diagnosis of SARS-CoV-2 by FDA under an Emergency Use Authorization (EUA). This EUA will remain  in effect (meaning this test can be used) for the duration of the COVID-19 declaration under Section 564(b)(1) of the Act, 21 U.S.C.section 360bbb-3(b)(1), unless the authorization is terminated  or revoked sooner.       Influenza A by PCR NEGATIVE NEGATIVE Final   Influenza B by PCR NEGATIVE NEGATIVE Final    Comment: (NOTE) The Xpert Xpress SARS-CoV-2/FLU/RSV plus assay is intended as an aid in the diagnosis of influenza from Nasopharyngeal swab  specimens and should not be used as a sole basis for treatment. Nasal washings and aspirates are unacceptable for Xpert Xpress SARS-CoV-2/FLU/RSV testing.  Fact Sheet for Patients: EntrepreneurPulse.com.au  Fact Sheet for Healthcare Providers: IncredibleEmployment.be  This test is not yet approved or cleared by the [place] FDA and has been authorized for detection and/or diagnosis of SARS-CoV-2 by FDA under an Emergency Use Authorization (EUA). This EUA will remain in effect (meaning this test can be used) for the duration of the COVID-19 declaration under Section 564(b)(1) of the Act, 21 U.S.C. section 360bbb-3(b)(1), unless the authorization is terminated or revoked.  Performed at [facility], [place] [address] [place], [place] [address]   Urine Culture     Status: Abnormal   Collection Time: 01/10/22  1:40 PM   Specimen: In/Out Cath Urine  Result Value Ref Range Status   Specimen Description   Final    IN/OUT CATH URINE Performed at [facility] [address], [place], [place] [address]    Special Requests   Final    NONE Performed at [facility], [place] [address], [place], [place] [address]    Culture 3,000 COLONIES/mL ENTEROCOCCUS FAECALIS (A)  Final   Report Status 01/13/2022 FINAL  Final   Organism ID, Bacteria ENTEROCOCCUS FAECALIS (A)  Final      Susceptibility   Enterococcus faecalis - MIC*    AMPICILLIN <=2 SENSITIVE Sensitive     NITROFURANTOIN <=16 SENSITIVE Sensitive     VANCOMYCIN 1 SENSITIVE Sensitive     * 3,000 COLONIES/mL ENTEROCOCCUS FAECALIS  MRSA Next Gen by PCR, Nasal     Status: None   Collection Time: 01/10/22  6:03 PM   Specimen: Nasal Mucosa; Nasal Swab  Result Value Ref Range Status   MRSA by PCR Next Gen NOT DETECTED NOT DETECTED Final    Comment: (NOTE) The GeneXpert MRSA Assay (FDA approved for NASAL specimens only), is one component of a  comprehensive MRSA colonization surveillance program. It is not intended to diagnose MRSA infection nor to guide or monitor treatment for MRSA infections. Test performance is not FDA approved in patients less than 21 years old. Performed at [facility], [place] [address], [place], [place] [address]       Radiology Studies: ECHOCARDIOGRAM COMPLETE  Result Date: 01/11/2022    ECHOCARDIOGRAM REPORT   Patient Name:   SYDNEY AZURE Date of Exam: 01/11/2022 Medical Rec #:  [id number]      Height:       69.0 in Accession #:  [id number]     Weight:       236.6 lb Date of Birth:  Mar 13, 1955      BSA:          2.219 m Patient Age:    15 years       BP:           118/64 mmHg Patient Gender: M              HR:           102 bpm. Exam Location:  Inpatient Procedure: 2D Echo, Cardiac Doppler, Color Doppler and Intracardiac            Opacification Agent Indications:    I25.110 Atherosclerotic heart disease of native coronary artery                 with unstable angina pectoris; I50.40* Unspecified combined                 systolic (congestive) and diastolic (congestive) heart failure  History:        Patient has prior history of Echocardiogram examinations, most                 recent 07/22/2019. CAD and Previous Myocardial Infarction,                 Signs/Symptoms:Dyspnea and Bacteremia; Risk Factors:Hypertension                 and Current Smoker. Chemo. Lung cancer.  Sonographer:    Roseanna Rainbow RDCS Referring Phys: [id number] [place]  Sonographer Comments: Technically difficult study due to poor echo windows, suboptimal apical window, suboptimal parasternal window, Technically challenging study due to limited acoustic windows, suboptimal subcostal window and patient is obese. Image acquisition challenging due to patient body habitus and Image acquisition challenging due to respiratory motion. Extremely difficult due to respiratory motion. Could not turn patient due to extreme pain and  restraint belt. IMPRESSIONS  1. Left ventricular ejection fraction, by estimation, is 60 to 65%. The left ventricle has normal function. The left ventricle has no regional wall motion abnormalities. Left ventricular diastolic parameters are indeterminate.  2. Right ventricular systolic function is normal. The right ventricular size is normal. Tricuspid regurgitation signal is inadequate for assessing PA pressure.  3. Left atrial size was mildly dilated.  4. Right atrial size was mildly dilated.  5. The mitral valve is grossly normal. No evidence of mitral valve regurgitation.  6. The aortic valve was not well visualized. Aortic valve regurgitation is not visualized. No aortic stenosis is present.  7. The inferior vena cava is normal in size with greater than 50% respiratory variability, suggesting right atrial pressure of 3 mmHg. Comparison(s): There is prominent respiration related variation in mitral inflow velocities and ventricular septal displacement (but no other findings to support constrictive or tamponade physiology). Consider increased work of breathing due to asthma/COPD exacerbation. FINDINGS  Left Ventricle: Left ventricular ejection fraction, by estimation, is 60 to 65%. The left ventricle has normal function. The left ventricle has no regional wall motion abnormalities. Definity contrast agent was given IV to delineate the left ventricular  endocardial borders. The left ventricular internal cavity size was normal in size. There is borderline concentric left ventricular hypertrophy. Left ventricular diastolic parameters are indeterminate. Right Ventricle: The right ventricular size is normal. No increase in right ventricular wall thickness. Right ventricular systolic function is normal. Tricuspid regurgitation signal is inadequate for assessing PA pressure. Left Atrium:  Left atrial size was mildly dilated. Right Atrium: Right atrial size was mildly dilated. Pericardium: The pericardium was not well  visualized. Presence of epicardial fat layer. Mitral Valve: The mitral valve is grossly normal. No evidence of mitral valve regurgitation. Tricuspid Valve: The tricuspid valve is not well visualized. Tricuspid valve regurgitation is not demonstrated. Aortic Valve: The aortic valve was not well visualized. Aortic valve regurgitation is not visualized. No aortic stenosis is present. Pulmonic Valve: The pulmonic valve was not well visualized. Pulmonic valve regurgitation is not visualized. Aorta: The ascending aorta was not well visualized. Venous: The inferior vena cava is normal in size with greater than 50% respiratory variability, suggesting right atrial pressure of 3 mmHg. IAS/Shunts: No atrial level shunt detected by color flow Doppler.  LEFT VENTRICLE PLAX 2D LVIDd:         5.60 cm     Diastology LVIDs:         3.70 cm     LV e' medial:    8.27 cm/s LV PW:         1.20 cm     LV E/e' medial:  8.9 LV IVS:        1.10 cm     LV e' lateral:   7.40 cm/s LVOT diam:     2.20 cm     LV E/e' lateral: 10.0 LV SV:         32 LV SV Index:   14 LVOT Area:     3.80 cm  LV Volumes (MOD) LV vol d, MOD A2C: 53.6 ml LV vol d, MOD A4C: 69.9 ml LV vol s, MOD A2C: 20.2 ml LV vol s, MOD A4C: 27.0 ml LV SV MOD A2C:     33.4 ml LV SV MOD A4C:     69.9 ml LV SV MOD BP:      40.7 ml RIGHT VENTRICLE            IVC RV S prime:     6.96 cm/s  IVC diam: 2.10 cm TAPSE (M-mode): 1.9 cm LEFT ATRIUM           Index        RIGHT ATRIUM           Index LA diam:      3.90 cm 1.76 cm/m   RA Area:     20.30 cm LA Vol (A2C): 11.3 ml 5.09 ml/m   RA Volume:   60.70 ml  27.36 ml/m LA Vol (A4C): 49.4 ml 22.26 ml/m  AORTIC VALVE LVOT Vmax:   60.30 cm/s LVOT Vmean:  37.900 cm/s LVOT VTI:    0.084 m  AORTA Ao Root diam: 3.70 cm Ao Asc diam:  3.60 cm MITRAL VALVE MV Area (PHT): 4.69 cm    SHUNTS MV Decel Time: 162 msec    Systemic VTI:  0.08 m MV E velocity: 73.87 cm/s  Systemic Diam: 2.20 cm MV A velocity: 79.13 cm/s MV E/A ratio:  0.93 Mihai  Croitoru MD Electronically signed by Sanda Klein MD Signature Date/Time: 01/11/2022/1:45:55 PM    Final    DG Abd Portable 1V  Result Date: 01/11/2022 CLINICAL DATA:  Abdominal distension and pain. EXAM: PORTABLE ABDOMEN - 1 VIEW COMPARISON:  01/10/2022 FINDINGS: Exam detail is diminished secondary to body habitus. There is been mild increase in caliber of the small bowel loops within the right lower quadrant of the abdomen measuring up to 2.9 cm. Decreased colonic gas compared with the prior exam. IMPRESSION: Mild  increase in caliber of the small bowel loops within the right lower quadrant of the abdomen with decrease colonic gas. If there is a clinical concern for developing bowel obstruction advise follow-up serial abdominal imaging to assess change in small bowel caliber. Electronically Signed   By: Kerby Moors M.D.   On: 01/11/2022 13:17      LOS: 3 days    Cordelia Poche, MD [facility] 01/13/2022, 9:25 AM   If 7PM-7AM, please contact night-coverage [email or web address]

## 2022-01-13 NOTE — Op Note (Signed)
[facility] Patient Name: [name] [name] Procedure Date: 01/13/2022 MRN: [id number] Attending MD: Docia Chuck. Henrene Pastor , MD, [id number] Date of Birth: 08-18-1955 CSN: [id number] Age: 66 Admit Type: Inpatient Procedure:                ERCP with sphincterotomy and common duct stone                            extraction Indications:              Bile duct stone on Computed Tomogram Scan and MRI.                            Clinical cholangitis: Providers:                Docia Chuck. Henrene Pastor, MD, Dulcy Fanny, Janee Morn, Technician Referring MD:             [facility] hospitalist Medicines:                General Anesthesia Complications:            No immediate complications. Estimated Blood Loss:     Estimated blood loss: none. Procedure:                Pre-Anesthesia Assessment:                           - Prior to the procedure, a History and Physical                            was performed, and patient medications and                            allergies were reviewed. The patient is competent.                            The risks and benefits of the procedure and the                            sedation options and risks were discussed with the                            patient. All questions were answered and informed                            consent was obtained. Patient identification and                            proposed procedure were verified by the physician.                            Mental Status Examination: alert and oriented.                            Airway  Examination: normal oropharyngeal airway and                            neck mobility. Respiratory Examination: clear to                            auscultation. CV Examination: normal. Prophylactic                            Antibiotics: The patient does not require                            prophylactic antibiotics. Prior Anticoagulants: The                             patient has taken no anticoagulant or antiplatelet                            agents. ASA Grade Assessment: III - A patient with                            severe systemic disease. After reviewing the risks                            and benefits, the patient was deemed in                            satisfactory condition to undergo the procedure.                            The anesthesia plan was to use moderate sedation /                            analgesia (conscious sedation). Immediately prior                            to administration of medications, the patient was                            re-assessed for adequacy to receive sedatives. The                            heart rate, respiratory rate, oxygen saturations,                            blood pressure, adequacy of pulmonary ventilation,                            and response to care were monitored throughout the                            procedure. The physical status of the patient was  re-assessed after the procedure.                           After obtaining informed consent, the scope was                            passed under direct vision. Throughout the                            procedure, the patient's blood pressure, pulse, and                            oxygen saturations were monitored continuously. The                            [id number]-[id number] ([id number]) Olympus duodenoscope was                            introduced through the mouth, and used to inject                            contrast into and used to inject contrast into the                            bile duct. The ERCP was accomplished without                            difficulty. The patient tolerated the procedure                            well. Scope In: Scope Out: Findings:      The esophagus was successfully intubated under direct vision. The scope       was advanced to a normal major papilla in the descending duodenum        without detailed examination of the pharynx, larynx and associated       structures, and upper GI tract. The upper GI tract was grossly normal       except for the duodenal bulb and postbulbar duodenum which showed       multiple superficial ulcers. The ampulla had evidence of prior       sphincterotomy which was small. The bile duct was deeply cannulated with       the sphincterotome. Contrast was injected. I personally interpreted the       bile duct images. Opacification of the entire biliary tree and the       cystic duct the gallbladder did not fill. A 0.035 inch straight standard       wire was passed into the biliary tree. An extension biliary       sphincterotomy was made with a sphincterotome using ERBE electrocautery.       There was no post-sphincterotomy bleeding. The biliary tree was swept       with a 15 mm balloon starting at the bifurcation. All (1 small) stones       were removed. No stones remained. Drainage was excellent. There was no       manipulation or injection of the pancreatic duct  by intent. Impression:               1. Choledocholithiasis. Status post ERC with                            extension sphincterotomy and common bile duct stone                            extraction.                           2. Filling of the biliary tree and cystic duct, but                            not the gallbladder                           3. Erosive duodenitis Moderate Sedation:      none Recommendation:           1. Continue antibiotics                           2. Trend liver tests                           3. Pantoprazole 40 mg daily for erosive duodenitis.                           4. Inpatient GI team will check patient tomorrow Procedure Code(s):        --- Professional ---                           [phone], Endoscopic retrograde                            cholangiopancreatography (ERCP); with removal of                            calculi/debris from  biliary/pancreatic duct(s)                           43262, Endoscopic retrograde                            cholangiopancreatography (ERCP); with                            sphincterotomy/papillotomy Diagnosis Code(s):        --- Professional ---                           K80.50, Calculus of bile duct without cholangitis                            or cholecystitis without obstruction CPT copyright 2022 [employer]. All rights reserved. The codes documented in this report are preliminary and upon coder review may  be revised to meet current compliance requirements. Docia Chuck.  Henrene Pastor, MD 01/13/2022 2:59:51 PM This report has been signed electronically. Number of Addenda: 0

## 2022-01-13 NOTE — Anesthesia Postprocedure Evaluation (Signed)
Anesthesia Post Note  Patient: JAIME GRIZZELL  Procedure(s) Performed: ENDOSCOPIC RETROGRADE CHOLANGIOPANCREATOGRAPHY (ERCP) SPHINCTEROTOMY REMOVAL OF STONES     Patient location during evaluation: PACU Anesthesia Type: General Level of consciousness: awake and alert and oriented Pain management: pain level controlled Vital Signs Assessment: post-procedure vital signs reviewed and stable Respiratory status: spontaneous breathing, nonlabored ventilation and respiratory function stable Cardiovascular status: blood pressure returned to baseline and stable Postop Assessment: no apparent nausea or vomiting Anesthetic complications: no   No notable events documented.  Last Vitals:  Vitals:   01/13/22 1459 01/13/22 1510  BP: (!) 154/84 131/62  Pulse: 86 74  Resp: 18 15  Temp:    SpO2: 91% 96%    Last Pain:  Vitals:   01/13/22 1510  TempSrc:   PainSc: 5                  Dezi Brauner A.

## 2022-01-13 NOTE — Progress Notes (Signed)
Progress Note     Subjective: Abdominal pain seems to be a little improved. He reports it is still significant and all over. He is unable to localize area of greatest severity. Tolerated CLD yesterday without nausea, emesis, worsening abdominal pain. Did need ativan this am for agitation. RN bedside  Objective: Vital signs in last 24 hours: Temp:  [97.7 F (36.5 C)-99 F (37.2 C)] 97.7 F (36.5 C) (11/09 0338) Pulse Rate:  [76-98] 78 (11/09 0800) Resp:  [16-28] 17 (11/09 0800) BP: (111-152)/(59-89) 118/67 (11/09 0800) SpO2:  [94 %-100 %] 99 % (11/09 0800) FiO2 (%):  [0 %] 0 % (11/08 1544) Last BM Date : 01/13/22  Intake/Output from previous day: 11/08 0701 - 11/09 0700 In: 2274.6 [P.O.:120; I.V.:2054.4; IV Piggyback:100.2] Out: 2800 [Urine:2800] Intake/Output this shift: Total I/O In: 32 [IV Piggyback:32] Out: -   PE: General: drowsy but conversant, resting comfortably when I entered the room Heart: regular, rate, and rhythm.  Heart rate is in the 70s. Palpable radial pulses bilaterally Lungs: Respiratory effort nonlabored on supplemental O2 Abd: Obese, soft, mildly diffusely tender without peritoneal signs. Well healed surgical scars MSK: all 4 extremities are symmetrical with no cyanosis, clubbing, or edema. Skin: warm and dry Psych: Alert and oriented x3   Lab Results:  Recent Labs    01/12/22 0324 01/13/22 0345  WBC 12.1* 9.6  HGB 11.3* 11.9*  HCT 34.4* 36.3*  PLT 102* 115*    BMET Recent Labs    01/12/22 0324 01/13/22 0345  NA 139 142  K 3.5 3.5  CL 109 111  CO2 25 25  GLUCOSE 110* 98  BUN 17 15  CREATININE 1.17 1.03  CALCIUM 7.9* 8.4*    PT/INR Recent Labs    01/12/22 0324 01/13/22 0345  LABPROT 16.5* 13.8  INR 1.3* 1.1    CMP     Component Value Date/Time   NA 142 01/13/2022 0345   K 3.5 01/13/2022 0345   CL 111 01/13/2022 0345   CO2 25 01/13/2022 0345   GLUCOSE 98 01/13/2022 0345   BUN 15 01/13/2022 0345   CREATININE 1.03  01/13/2022 0345   CREATININE 1.20 10/19/2021 1150   CREATININE 1.09 07/22/2016 1111   CALCIUM 8.4 (L) 01/13/2022 0345   PROT 6.0 (L) 01/13/2022 0345   ALBUMIN 2.7 (L) 01/13/2022 0345   AST 75 (H) 01/13/2022 0345   AST 22 10/19/2021 1150   ALT 123 (H) 01/13/2022 0345   ALT 36 10/19/2021 1150   ALKPHOS 90 01/13/2022 0345   BILITOT 1.8 (H) 01/13/2022 0345   BILITOT 0.8 10/19/2021 1150   GFRNONAA >60 01/13/2022 0345   GFRNONAA >60 10/19/2021 1150   GFRAA >60 01/16/2017 0405   Lipase     Component Value Date/Time   LIPASE 45 01/10/2022 1448       Studies/Results: ECHOCARDIOGRAM COMPLETE  Result Date: 01/11/2022    ECHOCARDIOGRAM REPORT   Patient Name:   ARIEON CORCORAN Date of Exam: 01/11/2022 Medical Rec #:  [id number]      Height:       69.0 in Accession #:    [id number]     Weight:       236.6 lb Date of Birth:  05/29/1955      BSA:          2.219 m Patient Age:    38 years       BP:           118/64 mmHg Patient Gender: M  HR:           102 bpm. Exam Location:  Inpatient Procedure: 2D Echo, Cardiac Doppler, Color Doppler and Intracardiac            Opacification Agent Indications:    I25.110 Atherosclerotic heart disease of native coronary artery                 with unstable angina pectoris; I50.40* Unspecified combined                 systolic (congestive) and diastolic (congestive) heart failure  History:        Patient has prior history of Echocardiogram examinations, most                 recent 07/22/2019. CAD and Previous Myocardial Infarction,                 Signs/Symptoms:Dyspnea and Bacteremia; Risk Factors:Hypertension                 and Current Smoker. Chemo. Lung cancer.  Sonographer:    Roseanna Rainbow RDCS Referring Phys: [id number] [place]  Sonographer Comments: Technically difficult study due to poor echo windows, suboptimal apical window, suboptimal parasternal window, Technically challenging study due to limited acoustic windows, suboptimal subcostal window  and patient is obese. Image acquisition challenging due to patient body habitus and Image acquisition challenging due to respiratory motion. Extremely difficult due to respiratory motion. Could not turn patient due to extreme pain and restraint belt. IMPRESSIONS  1. Left ventricular ejection fraction, by estimation, is 60 to 65%. The left ventricle has normal function. The left ventricle has no regional wall motion abnormalities. Left ventricular diastolic parameters are indeterminate.  2. Right ventricular systolic function is normal. The right ventricular size is normal. Tricuspid regurgitation signal is inadequate for assessing PA pressure.  3. Left atrial size was mildly dilated.  4. Right atrial size was mildly dilated.  5. The mitral valve is grossly normal. No evidence of mitral valve regurgitation.  6. The aortic valve was not well visualized. Aortic valve regurgitation is not visualized. No aortic stenosis is present.  7. The inferior vena cava is normal in size with greater than 50% respiratory variability, suggesting right atrial pressure of 3 mmHg. Comparison(s): There is prominent respiration related variation in mitral inflow velocities and ventricular septal displacement (but no other findings to support constrictive or tamponade physiology). Consider increased work of breathing due to asthma/COPD exacerbation. FINDINGS  Left Ventricle: Left ventricular ejection fraction, by estimation, is 60 to 65%. The left ventricle has normal function. The left ventricle has no regional wall motion abnormalities. Definity contrast agent was given IV to delineate the left ventricular  endocardial borders. The left ventricular internal cavity size was normal in size. There is borderline concentric left ventricular hypertrophy. Left ventricular diastolic parameters are indeterminate. Right Ventricle: The right ventricular size is normal. No increase in right ventricular wall thickness. Right ventricular systolic  function is normal. Tricuspid regurgitation signal is inadequate for assessing PA pressure. Left Atrium: Left atrial size was mildly dilated. Right Atrium: Right atrial size was mildly dilated. Pericardium: The pericardium was not well visualized. Presence of epicardial fat layer. Mitral Valve: The mitral valve is grossly normal. No evidence of mitral valve regurgitation. Tricuspid Valve: The tricuspid valve is not well visualized. Tricuspid valve regurgitation is not demonstrated. Aortic Valve: The aortic valve was not well visualized. Aortic valve regurgitation is not visualized. No aortic stenosis is present. Pulmonic Valve:  The pulmonic valve was not well visualized. Pulmonic valve regurgitation is not visualized. Aorta: The ascending aorta was not well visualized. Venous: The inferior vena cava is normal in size with greater than 50% respiratory variability, suggesting right atrial pressure of 3 mmHg. IAS/Shunts: No atrial level shunt detected by color flow Doppler.  LEFT VENTRICLE PLAX 2D LVIDd:         5.60 cm     Diastology LVIDs:         3.70 cm     LV e' medial:    8.27 cm/s LV PW:         1.20 cm     LV E/e' medial:  8.9 LV IVS:        1.10 cm     LV e' lateral:   7.40 cm/s LVOT diam:     2.20 cm     LV E/e' lateral: 10.0 LV SV:         32 LV SV Index:   14 LVOT Area:     3.80 cm  LV Volumes (MOD) LV vol d, MOD A2C: 53.6 ml LV vol d, MOD A4C: 69.9 ml LV vol s, MOD A2C: 20.2 ml LV vol s, MOD A4C: 27.0 ml LV SV MOD A2C:     33.4 ml LV SV MOD A4C:     69.9 ml LV SV MOD BP:      40.7 ml RIGHT VENTRICLE            IVC RV S prime:     6.96 cm/s  IVC diam: 2.10 cm TAPSE (M-mode): 1.9 cm LEFT ATRIUM           Index        RIGHT ATRIUM           Index LA diam:      3.90 cm 1.76 cm/m   RA Area:     20.30 cm LA Vol (A2C): 11.3 ml 5.09 ml/m   RA Volume:   60.70 ml  27.36 ml/m LA Vol (A4C): 49.4 ml 22.26 ml/m  AORTIC VALVE LVOT Vmax:   60.30 cm/s LVOT Vmean:  37.900 cm/s LVOT VTI:    0.084 m  AORTA Ao Root diam:  3.70 cm Ao Asc diam:  3.60 cm MITRAL VALVE MV Area (PHT): 4.69 cm    SHUNTS MV Decel Time: 162 msec    Systemic VTI:  0.08 m MV E velocity: 73.87 cm/s  Systemic Diam: 2.20 cm MV A velocity: 79.13 cm/s MV E/A ratio:  0.93 Mihai Croitoru MD Electronically signed by Sanda Klein MD Signature Date/Time: 01/11/2022/1:45:55 PM    Final    DG Abd Portable 1V  Result Date: 01/11/2022 CLINICAL DATA:  Abdominal distension and pain. EXAM: PORTABLE ABDOMEN - 1 VIEW COMPARISON:  01/10/2022 FINDINGS: Exam detail is diminished secondary to body habitus. There is been mild increase in caliber of the small bowel loops within the right lower quadrant of the abdomen measuring up to 2.9 cm. Decreased colonic gas compared with the prior exam. IMPRESSION: Mild increase in caliber of the small bowel loops within the right lower quadrant of the abdomen with decrease colonic gas. If there is a clinical concern for developing bowel obstruction advise follow-up serial abdominal imaging to assess change in small bowel caliber. Electronically Signed   By: Kerby Moors M.D.   On: 01/11/2022 13:17    Anti-infectives: Anti-infectives (From admission, onward)    Start     Dose/Rate Route Frequency Ordered Stop  01/12/22 1500  piperacillin-tazobactam (ZOSYN) IVPB 3.375 g        3.375 g 12.5 mL/hr over 240 Minutes Intravenous Every 8 hours 01/12/22 1337     01/11/22 1400  vancomycin (VANCOREADY) IVPB 1500 mg/300 mL  Status:  Discontinued        1,500 mg 150 mL/hr over 120 Minutes Intravenous Every 24 hours 01/10/22 1611 01/11/22 0533   01/11/22 1300  cefTRIAXone (ROCEPHIN) 2 g in sodium chloride 0.9 % 100 mL IVPB  Status:  Discontinued        2 g 200 mL/hr over 30 Minutes Intravenous Every 24 hours 01/11/22 0533 01/12/22 1337   01/11/22 1300  vancomycin (VANCOREADY) IVPB 1250 mg/250 mL  Status:  Discontinued        1,250 mg 166.7 mL/hr over 90 Minutes Intravenous Every 24 hours 01/11/22 0922 01/12/22 1337   01/10/22 2200   ceFEPIme (MAXIPIME) 2 g in sodium chloride 0.9 % 100 mL IVPB  Status:  Discontinued        2 g 200 mL/hr over 30 Minutes Intravenous Every 8 hours 01/10/22 1521 01/11/22 0533   01/10/22 2200  metroNIDAZOLE (FLAGYL) IVPB 500 mg  Status:  Discontinued        500 mg 100 mL/hr over 60 Minutes Intravenous Every 12 hours 01/10/22 1521 01/11/22 0824   01/10/22 1230  ceFEPIme (MAXIPIME) 2 g in sodium chloride 0.9 % 100 mL IVPB        2 g 200 mL/hr over 30 Minutes Intravenous  Once 01/10/22 1221 01/10/22 1324   01/10/22 1230  metroNIDAZOLE (FLAGYL) IVPB 500 mg        500 mg 100 mL/hr over 60 Minutes Intravenous  Once 01/10/22 1221 01/10/22 1325   01/10/22 1230  vancomycin (VANCOCIN) IVPB 1000 mg/200 mL premix  Status:  Discontinued        1,000 mg 200 mL/hr over 60 Minutes Intravenous  Once 01/10/22 1221 01/10/22 1228   01/10/22 1230  vancomycin (VANCOREADY) IVPB 2000 mg/400 mL        2,000 mg 200 mL/hr over 120 Minutes Intravenous  Once 01/10/22 1228 01/10/22 1537        Assessment/Plan Cholelithiasis, choledocholithiasis, possible cholangitis   - CT 11/6 with choledocholithiasis - RUQ [place] 11/6 without cholecystitis or definitive choledocholithiasis -LFTs continue to down trend - clinical picture is not definitve for cholecystitis, given improved LFTs possibly he passed the stone. MRCP per GI this am and possible ERCP - will need to consider laparoscopic cholecystectomy as he is at risk for repeated episodes (this is his second). Discussed this with patient and primary MD this am. Patient is open to surgery but wants his sister and oncologist involved in decision making. Appears he is on hospice but remains full code. Given his surgical history he is at higher risk for complications - continue IV abx  FEN: npo, IVF ID: [email or web address]/flagyl 11/6> VTE: Lovenox  I reviewed Consultant GI notes, hospitalist notes, last 24 h vitals and pain scores, last 48 h intake and output, last 24 h labs and  trends, and last 24 h imaging results.    LOS: 3 days   [facility] 01/13/2022, 8:35 AM Please see Amion for pager number during day hours 7:00am-4:30pm

## 2022-01-13 NOTE — Interval H&P Note (Signed)
History and Physical Interval Note:  01/13/2022 1:45 PM  [name] [name]  has presented today for surgery, with the diagnosis of Elevated LFTs, ascending cholangitis. As the biliary backup endoscopist I have been following this case throughout the week.  I have been communicating with the primary GI team.  I have reviewed blood work and x-rays.  It appears that he has a distal common duct stone and had associated cholangitis.  Now for ERCP.  The various methods of treatment have been discussed with the patient and family. After consideration of risks, benefits and other options for treatment, the patient has consented to  Procedure(s): ENDOSCOPIC RETROGRADE CHOLANGIOPANCREATOGRAPHY (ERCP) (N/A) as a surgical intervention.  The patient's history has been reviewed, patient examined, no change in status, stable for surgery.  I have reviewed the patient's chart and labs.  Questions were answered to the patient's satisfaction.  I saw and examined the patient preprocedure in the endoscopy suite.   Scarlette Shorts

## 2022-01-13 NOTE — Transfer of Care (Signed)
Immediate Anesthesia Transfer of Care Note  Patient: [name] [name]  Procedure(s) Performed: ENDOSCOPIC RETROGRADE CHOLANGIOPANCREATOGRAPHY (ERCP) SPHINCTEROTOMY REMOVAL OF STONES  Patient Location: Endoscopy Unit  Anesthesia Type:General  Level of Consciousness: awake  Airway & Oxygen Therapy: Patient Spontanous Breathing and Patient connected to face mask oxygen  Post-op Assessment: Report given to RN and Post -op Vital signs reviewed and stable  Post vital signs: Reviewed and stable  Last Vitals:  Vitals Value Taken Time  BP 151/64 01/13/22 1450  Temp    Pulse 88 01/13/22 1450  Resp 17 01/13/22 1450  SpO2 100 % 01/13/22 1450  Vitals shown include unvalidated device data.  Last Pain:  Vitals:   01/13/22 1229  TempSrc: Temporal  PainSc: 5       Patients Stated Pain Goal: 8 (61/47/09 2957)  Complications: No notable events documented.

## 2022-01-13 NOTE — Anesthesia Procedure Notes (Signed)
Procedure Name: Intubation Date/Time: 01/13/2022 1:50 PM  Performed by: Sharlette Dense, CRNAPre-anesthesia Checklist: Patient identified, Emergency Drugs available, Suction available and Patient being monitored Patient Re-evaluated:Patient Re-evaluated prior to induction Oxygen Delivery Method: Circle system utilized Preoxygenation: Pre-oxygenation with 100% oxygen Induction Type: IV induction Ventilation: Mask ventilation without difficulty Laryngoscope Size: Miller and 3 Grade View: Grade I Tube type: Oral Tube size: 7.5 mm Number of attempts: 1 Airway Equipment and Method: Stylet Placement Confirmation: ETT inserted through vocal cords under direct vision, positive ETCO2 and breath sounds checked- equal and bilateral Secured at: 22 cm Tube secured with: Tape Dental Injury: Teeth and Oropharynx as per pre-operative assessment

## 2022-01-14 ENCOUNTER — Inpatient Hospital Stay (HOSPITAL_COMMUNITY): Payer: Medicare Other

## 2022-01-14 DIAGNOSIS — R748 Abnormal levels of other serum enzymes: Secondary | ICD-10-CM | POA: Diagnosis not present

## 2022-01-14 DIAGNOSIS — R1010 Upper abdominal pain, unspecified: Secondary | ICD-10-CM | POA: Diagnosis not present

## 2022-01-14 DIAGNOSIS — T827XXD Infection and inflammatory reaction due to other cardiac and vascular devices, implants and grafts, subsequent encounter: Secondary | ICD-10-CM

## 2022-01-14 DIAGNOSIS — R7881 Bacteremia: Secondary | ICD-10-CM | POA: Diagnosis not present

## 2022-01-14 DIAGNOSIS — K8050 Calculus of bile duct without cholangitis or cholecystitis without obstruction: Secondary | ICD-10-CM | POA: Diagnosis not present

## 2022-01-14 HISTORY — PX: IR REMOVAL TUN ACCESS W/ PORT W/O FL MOD SED: IMG2290

## 2022-01-14 LAB — CULTURE, BLOOD (ROUTINE X 2): Special Requests: ADEQUATE

## 2022-01-14 LAB — CBC WITH DIFFERENTIAL/PLATELET
Abs Immature Granulocytes: 0.01 10*3/uL (ref 0.00–0.07)
Basophils Absolute: 0 10*3/uL (ref 0.0–0.1)
Basophils Relative: 0 %
Eosinophils Absolute: 0 10*3/uL (ref 0.0–0.5)
Eosinophils Relative: 0 %
HCT: 36.1 % — ABNORMAL LOW (ref 39.0–52.0)
Hemoglobin: 11.9 g/dL — ABNORMAL LOW (ref 13.0–17.0)
Immature Granulocytes: 0 %
Lymphocytes Relative: 14 %
Lymphs Abs: 0.7 10*3/uL (ref 0.7–4.0)
MCH: 31.7 pg (ref 26.0–34.0)
MCHC: 33 g/dL (ref 30.0–36.0)
MCV: 96.3 fL (ref 80.0–100.0)
Monocytes Absolute: 0.1 10*3/uL (ref 0.1–1.0)
Monocytes Relative: 2 %
Neutro Abs: 3.9 10*3/uL (ref 1.7–7.7)
Neutrophils Relative %: 84 %
Platelets: 128 10*3/uL — ABNORMAL LOW (ref 150–400)
RBC: 3.75 MIL/uL — ABNORMAL LOW (ref 4.22–5.81)
RDW: 13.6 % (ref 11.5–15.5)
WBC: 4.6 10*3/uL (ref 4.0–10.5)
nRBC: 0 % (ref 0.0–0.2)

## 2022-01-14 LAB — HEPATIC FUNCTION PANEL
ALT: 86 U/L — ABNORMAL HIGH (ref 0–44)
AST: 35 U/L (ref 15–41)
Albumin: 2.6 g/dL — ABNORMAL LOW (ref 3.5–5.0)
Alkaline Phosphatase: 88 U/L (ref 38–126)
Bilirubin, Direct: 0.5 mg/dL — ABNORMAL HIGH (ref 0.0–0.2)
Indirect Bilirubin: 1 mg/dL — ABNORMAL HIGH (ref 0.3–0.9)
Total Bilirubin: 1.5 mg/dL — ABNORMAL HIGH (ref 0.3–1.2)
Total Protein: 6.1 g/dL — ABNORMAL LOW (ref 6.5–8.1)

## 2022-01-14 LAB — BASIC METABOLIC PANEL
Anion gap: 4 — ABNORMAL LOW (ref 5–15)
BUN: 20 mg/dL (ref 8–23)
CO2: 27 mmol/L (ref 22–32)
Calcium: 8.2 mg/dL — ABNORMAL LOW (ref 8.9–10.3)
Chloride: 109 mmol/L (ref 98–111)
Creatinine, Ser: 0.98 mg/dL (ref 0.61–1.24)
GFR, Estimated: >60 mL/min (ref >60)
Glucose, Bld: 175 mg/dL — ABNORMAL HIGH (ref 70–99)
Potassium: 4 mmol/L (ref 3.5–5.1)
Sodium: 140 mmol/L (ref 135–145)

## 2022-01-14 MED ORDER — LIDOCAINE-EPINEPHRINE 1 %-1:100000 IJ SOLN
INTRAMUSCULAR | Status: AC
  Filled 2022-01-14: qty 1

## 2022-01-14 MED ORDER — FENTANYL CITRATE PF 50 MCG/ML IJ SOSY
25.0000 ug | PREFILLED_SYRINGE | Freq: Once | INTRAMUSCULAR | Status: AC
  Administered 2022-01-14: 25 ug via INTRAVENOUS
  Filled 2022-01-14: qty 1

## 2022-01-14 NOTE — Progress Notes (Signed)
PROGRESS NOTE    [name] [name]  GXQ:[id number] DOB: [birth year] DOA: 01/10/2022 PCP: [name], MD   Brief Narrative: [name] [name] is a 66 y.o. male with a history of depression, anxiety, chronic pain, osteoarthritis, COPD, hyperlipidemia, fibromyalgia, GERD, migraine, hypertension, obesity. Patient presented secondary to abdominal pain, nausea and vomiting with evidence of a CBD stone. Patient met sepsis criteria on admission and was found to have an E. Coli and Enterococcus casseliflavus bacteremia. Antibiotics tailored to cover bacteremia. GI plan for ERCP.   Assessment and Plan:  Choledocholithiasis Elevated AST/ALT with CT imaging significant for suspicion of a 4 mm stone at the ampulla. GI and general surgery consulted. AST/ALT/bilirubin trending down. ERCP with sphincterotomy and common bile duct stone extraction performed on 11/9. -GI recommendations: signing off -General surgery recommendations: Planning possible laparoscopic cholecystectomy  Sepsis Present on admission. Secondary to E. Coli and enterococcus casseliflavus bacteremia likely related to above source. Antibiotics as mentioned below.  E. Coli bacteremia Enterococcus casseliflavus bacteremia Patient empirically managed on Vancomycin, Flagyl and Cefepime. He was transitioned to Ceftriaxone and Vancomycin for E. Coli and GPCs noted on culture data and now transitioned to Zosyn with final identification of bacteria. ID consulted. -Unasyn IV per ID -Transesophageal Echocardiogram and port removal arranged per ID recommendations  Acute metabolic encephalopathy Presumed secondary to sepsis. Resolved.  Chronic pain Patient's home regimen held on admission and patient started on a dilaudid PCA pump. -Continue MS Contin -Continue dilaudid PRN  Stage IV non-small cell right lung cancer Noted. Patient under observation.  CAD History of PCI of RCA in 2018. Currently on Brilinta which is held secondary to  plan for ERCP.  Chronic diastolic heart failure Noted. Currently without acute heart failure. Home Lasix held. -Daily weights and strict in/out  Primary hypertension Patient appears to be managed on diuretic monotherapy. Lasix held secondary to sepsis.  Hyperlipidemia Patient is on Crestor as an outpatient which was held on admission. Elevated AST/ALT secondary to choledocholithiasis -Continue to hold Crestor for now  COPD -Continue Xopenex PRN  Thrombocytopenia Trending up. Likely secondary to sepsis. -Trend CBC  DVT prophylaxis: Lovenox Code Status:   Code Status: Full Code Family Communication: None at bedside Disposition Plan: Discharge home likely in 3-5 days pending improvement of mental status, transition to outpatient antibiotic regimen, specialist recommendations/management   Consultants:  General surgery Gastroenterology Infectious disease  Procedures:  None  Antimicrobials: Vancomycin Cefepime Flagyl Ceftriaxone Zosyn   Subjective: Pain improved. No specific issues this morning.  Objective: BP 132/82   Pulse 65   Temp 98.3 F (36.8 C) (Oral)   Resp 16   Ht _0  (1.753 m)   Wt 107.3 kg   SpO2 94%   BMI 34.93 kg/m   Examination:  General exam: Appears calm and comfortable Respiratory system: Clear to auscultation. Respiratory effort normal. Cardiovascular system: S1 & S2 heard, RRR. Gastrointestinal system: Abdomen is protuberant, soft and nontender. Normal bowel sounds heard. Central nervous system: Alert and oriented. No focal neurological deficits. Musculoskeletal: No calf tenderness Skin: No cyanosis. No rashes Psychiatry: Judgement and insight appear normal. Mood & affect appropriate.    Data Reviewed: I have personally reviewed following labs and imaging studies  CBC Lab Results  Component Value Date   WBC 4.6 01/14/2022   RBC 3.75 (L) 01/14/2022   HGB 11.9 (L) 01/14/2022   HCT 36.1 (L) 01/14/2022   MCV 96.3 01/14/2022    MCH 31.7 01/14/2022   PLT 128 (L) 01/14/2022   MCHC  33.0 01/14/2022   RDW 13.6 01/14/2022   LYMPHSABS 0.7 01/14/2022   MONOABS 0.1 01/14/2022   EOSABS 0.0 01/14/2022   BASOSABS 0.0 95/62/1308     Last metabolic panel Lab Results  Component Value Date   NA 140 01/14/2022   K 4.0 01/14/2022   CL 109 01/14/2022   CO2 27 01/14/2022   BUN 20 01/14/2022   CREATININE 0.98 01/14/2022   GLUCOSE 175 (H) 01/14/2022   GFRNONAA >60 01/14/2022   GFRAA >60 01/16/2017   CALCIUM 8.2 (L) 01/14/2022   PHOS 3.8 02/08/2021   PROT 6.1 (L) 01/14/2022   ALBUMIN 2.6 (L) 01/14/2022   BILITOT 1.5 (H) 01/14/2022   ALKPHOS 88 01/14/2022   AST 35 01/14/2022   ALT 86 (H) 01/14/2022   ANIONGAP 4 (L) 01/14/2022    GFR: Estimated Creatinine Clearance: 89.5 mL/min (by C-G formula based on SCr of 0.98 mg/dL).  Recent Results (from the past 240 hour(s))  Blood Culture (routine x 2)     Status: Abnormal (Preliminary result)   Collection Time: 01/10/22 12:18 PM   Specimen: BLOOD  Result Value Ref Range Status   Specimen Description   Final    BLOOD LEFT ANTECUBITAL Performed at [facility] [address] [place], [place] [address]    Special Requests   Final    BOTTLES DRAWN AEROBIC AND ANAEROBIC Blood Culture adequate volume Performed at [facility] [address], [place], [place] [address]    Culture  Setup Time   Final    GRAM POSITIVE COCCI IN PAIRS IN BOTH AEROBIC AND ANAEROBIC BOTTLES CRITICAL VALUE NOTED.  VALUE IS CONSISTENT WITH PREVIOUSLY REPORTED AND CALLED VALUE.    Culture (A)  Final    ENTEROCOCCUS CASSELIFLAVUS SUSCEPTIBILITIES TO FOLLOW REPEATING Performed at [facility], [place] [address], [place], [place] [address]    Report Status PENDING  Incomplete  Blood Culture (routine x 2)     Status: Abnormal (Preliminary result)   Collection Time: 01/10/22 12:36 PM   Specimen: BLOOD  Result Value Ref Range Status   Specimen  Description BLOOD PORTA CATH  Final   Special Requests   Final    BOTTLES DRAWN AEROBIC AND ANAEROBIC Blood Culture adequate volume   Culture  Setup Time   Final    GRAM POSITIVE COCCI IN PAIRS GRAM NEGATIVE RODS IN BOTH AEROBIC AND ANAEROBIC BOTTLES CRITICAL RESULT CALLED TO, READ BACK BY AND VERIFIED WITH: PHARMD D. POINDEXTER 01/11/22 _0  BY AB    Culture (A)  Final    ESCHERICHIA COLI CORRECTED RESULTS ENTEROCOCCUS CASSELIFLAVUS PREVIOUSLY REPORTED AS: ENTEROCOCCUS FAECALIS CORRECTED RESULTS CALLED TO: PHARMD Mitzie Na [phone] FCP    Report Status PENDING  Incomplete   Organism ID, Bacteria ESCHERICHIA COLI  Final      Susceptibility   Escherichia coli - MIC*    AMPICILLIN 8 SENSITIVE Sensitive     CEFAZOLIN <=4 SENSITIVE Sensitive     CEFEPIME <=0.12 SENSITIVE Sensitive     CEFTAZIDIME <=1 SENSITIVE Sensitive     CEFTRIAXONE <=0.25 SENSITIVE Sensitive     CIPROFLOXACIN <=0.25 SENSITIVE Sensitive     GENTAMICIN <=1 SENSITIVE Sensitive     IMIPENEM <=0.25 SENSITIVE Sensitive     TRIMETH/SULFA <=20 SENSITIVE Sensitive     AMPICILLIN/SULBACTAM 4 SENSITIVE Sensitive     PIP/TAZO Value in next row Sensitive      <=4 SENSITIVEPerformed at [facility] [address], [place],  [address]    *  ESCHERICHIA COLI  Blood Culture ID Panel (Reflexed)     Status: Abnormal   Collection Time: 01/10/22 12:36 PM  Result Value Ref Range Status   Enterococcus faecalis NOT DETECTED NOT DETECTED Final   Enterococcus Faecium NOT DETECTED NOT DETECTED Final   Listeria monocytogenes NOT DETECTED NOT DETECTED Final   Staphylococcus species NOT DETECTED NOT DETECTED Final   Staphylococcus aureus (BCID) NOT DETECTED NOT DETECTED Final   Staphylococcus epidermidis NOT DETECTED NOT DETECTED Final   Staphylococcus lugdunensis NOT DETECTED NOT DETECTED Final   Streptococcus species NOT DETECTED NOT DETECTED Final   Streptococcus agalactiae NOT DETECTED NOT DETECTED Final    Streptococcus pneumoniae NOT DETECTED NOT DETECTED Final   Streptococcus pyogenes NOT DETECTED NOT DETECTED Final   A.calcoaceticus-baumannii NOT DETECTED NOT DETECTED Final   Bacteroides fragilis NOT DETECTED NOT DETECTED Final   Enterobacterales DETECTED (A) NOT DETECTED Final    Comment: Enterobacterales represent a large order of gram negative bacteria, not a single organism. CRITICAL RESULT CALLED TO, READ BACK BY AND VERIFIED WITH: PHARMD D. POINDEXTER 01/11/22 _0  BY AB    Enterobacter cloacae complex NOT DETECTED NOT DETECTED Final   Escherichia coli DETECTED (A) NOT DETECTED Final    Comment: CRITICAL RESULT CALLED TO, READ BACK BY AND VERIFIED WITH: PHARMD D. POINDEXTER 01/11/22 _1  BY AB    Klebsiella aerogenes NOT DETECTED NOT DETECTED Final   Klebsiella oxytoca NOT DETECTED NOT DETECTED Final   Klebsiella pneumoniae NOT DETECTED NOT DETECTED Final   Proteus species NOT DETECTED NOT DETECTED Final   Salmonella species NOT DETECTED NOT DETECTED Final   Serratia marcescens NOT DETECTED NOT DETECTED Final   Haemophilus influenzae NOT DETECTED NOT DETECTED Final   Neisseria meningitidis NOT DETECTED NOT DETECTED Final   Pseudomonas aeruginosa NOT DETECTED NOT DETECTED Final   Stenotrophomonas maltophilia NOT DETECTED NOT DETECTED Final   Candida albicans NOT DETECTED NOT DETECTED Final   Candida auris NOT DETECTED NOT DETECTED Final   Candida glabrata NOT DETECTED NOT DETECTED Final   Candida krusei NOT DETECTED NOT DETECTED Final   Candida parapsilosis NOT DETECTED NOT DETECTED Final   Candida tropicalis NOT DETECTED NOT DETECTED Final   Cryptococcus neoformans/gattii NOT DETECTED NOT DETECTED Final   CTX-M ESBL NOT DETECTED NOT DETECTED Final   Carbapenem resistance IMP NOT DETECTED NOT DETECTED Final   Carbapenem resistance KPC NOT DETECTED NOT DETECTED Final   Carbapenem resistance NDM NOT DETECTED NOT DETECTED Final   Carbapenem resist OXA 48 LIKE NOT DETECTED NOT  DETECTED Final   Carbapenem resistance VIM NOT DETECTED NOT DETECTED Final    Comment: Performed at [facility], [address] [place], [place] [address]  Resp Panel by RT-PCR (Flu A&B, Covid)     Status: None   Collection Time: 01/10/22  1:40 PM   Specimen: Nasal Swab  Result Value Ref Range Status   SARS Coronavirus 2 by RT PCR NEGATIVE NEGATIVE Final    Comment: (NOTE) SARS-CoV-2 target nucleic acids are NOT DETECTED.  The SARS-CoV-2 RNA is generally detectable in upper respiratory specimens during the acute phase of infection. The lowest concentration of SARS-CoV-2 viral copies this assay can detect is 138 copies/mL. A negative result does not preclude SARS-Cov-2 infection and should not be used as the sole basis for treatment or other patient management decisions. A negative result may occur with  improper specimen collection/handling, submission of specimen other than nasopharyngeal swab, presence of viral mutation(s) within the areas targeted by this assay,  and inadequate number of viral copies(<138 copies/mL). A negative result must be combined with clinical observations, patient history, and epidemiological information. The expected result is Negative.  Fact Sheet for Patients:  EntrepreneurPulse.com.au  Fact Sheet for Healthcare Providers:  IncredibleEmployment.be  This test is no t yet approved or cleared by the [place] FDA and  has been authorized for detection and/or diagnosis of SARS-CoV-2 by FDA under an Emergency Use Authorization (EUA). This EUA will remain  in effect (meaning this test can be used) for the duration of the COVID-19 declaration under Section 564(b)(1) of the Act, 21 U.S.C.section 360bbb-3(b)(1), unless the authorization is terminated  or revoked sooner.       Influenza A by PCR NEGATIVE NEGATIVE Final   Influenza B by PCR NEGATIVE NEGATIVE Final    Comment: (NOTE) The Xpert Xpress  SARS-CoV-2/FLU/RSV plus assay is intended as an aid in the diagnosis of influenza from Nasopharyngeal swab specimens and should not be used as a sole basis for treatment. Nasal washings and aspirates are unacceptable for Xpert Xpress SARS-CoV-2/FLU/RSV testing.  Fact Sheet for Patients: EntrepreneurPulse.com.au  Fact Sheet for Healthcare Providers: IncredibleEmployment.be  This test is not yet approved or cleared by the [place] FDA and has been authorized for detection and/or diagnosis of SARS-CoV-2 by FDA under an Emergency Use Authorization (EUA). This EUA will remain in effect (meaning this test can be used) for the duration of the COVID-19 declaration under Section 564(b)(1) of the Act, 21 U.S.C. section 360bbb-3(b)(1), unless the authorization is terminated or revoked.  Performed at [facility], [place] [address] [place], [place] [address]   Urine Culture     Status: Abnormal   Collection Time: 01/10/22  1:40 PM   Specimen: In/Out Cath Urine  Result Value Ref Range Status   Specimen Description   Final    IN/OUT CATH URINE Performed at [facility] [address], [place], [place] [address]    Special Requests   Final    NONE Performed at [facility], [place] [address], [place], [place] [address]    Culture 3,000 COLONIES/mL ENTEROCOCCUS FAECALIS (A)  Final   Report Status 01/13/2022 FINAL  Final   Organism ID, Bacteria ENTEROCOCCUS FAECALIS (A)  Final      Susceptibility   Enterococcus faecalis - MIC*    AMPICILLIN <=2 SENSITIVE Sensitive     NITROFURANTOIN <=16 SENSITIVE Sensitive     VANCOMYCIN 1 SENSITIVE Sensitive     * 3,000 COLONIES/mL ENTEROCOCCUS FAECALIS  MRSA Next Gen by PCR, Nasal     Status: None   Collection Time: 01/10/22  6:03 PM   Specimen: Nasal Mucosa; Nasal Swab  Result Value Ref Range Status   MRSA by PCR Next Gen NOT DETECTED NOT DETECTED Final     Comment: (NOTE) The GeneXpert MRSA Assay (FDA approved for NASAL specimens only), is one component of a comprehensive MRSA colonization surveillance program. It is not intended to diagnose MRSA infection nor to guide or monitor treatment for MRSA infections. Test performance is not FDA approved in patients less than 40 years old. Performed at [facility], [place] [address] [place], [place] [address]   Culture, blood (Routine X 2) w Reflex to ID Panel     Status: None (Preliminary result)   Collection Time: 01/13/22  6:37 AM   Specimen: BLOOD RIGHT ARM  Result Value Ref Range Status   Specimen Description   Final    BLOOD RIGHT ARM  Performed at [facility], [place] [address] [place], [place] [address]    Special Requests   Final    BOTTLES DRAWN AEROBIC AND ANAEROBIC Blood Culture adequate volume Performed at [facility] [address], [place], [place] [address]    Culture   Final    NO GROWTH < 24 HOURS Performed at [facility] [address], [place], [place] [address]    Report Status PENDING  Incomplete  Culture, blood (Routine X 2) w Reflex to ID Panel     Status: None (Preliminary result)   Collection Time: 01/13/22  6:46 AM   Specimen: BLOOD  Result Value Ref Range Status   Specimen Description   Final    BLOOD BLOOD LEFT HAND Performed at [facility] [address] [place], [place] [address]    Special Requests   Final    BOTTLES DRAWN AEROBIC AND ANAEROBIC Blood Culture results may not be optimal due to an inadequate volume of blood received in culture bottles Performed at [facility] [address], [place],  [address]    Culture   Final    NO GROWTH < 24 HOURS Performed at [facility] [address], [place],  [address]    Report Status PENDING  Incomplete      Radiology Studies: DG ERCP  Result Date: 01/13/2022 CLINICAL DATA:   ERCP. EXAM: ERCP TECHNIQUE: Multiple spot images obtained with the fluoroscopic device and submitted for interpretation post-procedure. FLUOROSCOPY TIME: FLUOROSCOPY TIME 3 minutes, 27 seconds (111.3 mGy) COMPARISON:  MRCP-01/13/2022 CT abdomen and pelvis-01/10/2022 FINDINGS: 15 spot intraoperative fluoroscopic images the right upper abdominal quadrant during ERCP are provided for review Initial image demonstrates an ERCP probe overlying the right upper abdominal quadrant. Initial images demonstrate selective cannulation and opacification of the common bile duct. Subsequent images demonstrate insufflation of a balloon within the central aspect of the CBD. Balloon cholangiogram demonstrates a persistent nonocclusive filling defect within the distal aspect of the CBD (image 7) with subsequent biliary sweeping and presumed sphincterotomy. There is minimal opacification of the central aspect of the intrahepatic biliary tree which appears nondilated. There is opacification of the cystic duct and faint opacification of the gallbladder lumen. There is no definitive opacification of the pancreatic duct. IMPRESSION: ERCP with biliary sweeping, stone extraction and presumed sphincterotomy as detailed above. These images were submitted for radiologic interpretation only. Please see the procedural report for the amount of contrast and the fluoroscopy time utilized. Electronically Signed   By: Sandi Mariscal M.D.   On: 01/13/2022 15:14   MR ABDOMEN MRCP WO CONTRAST  Result Date: 01/13/2022 CLINICAL DATA:  Right upper quadrant pain.  History of lung cancer. EXAM: MRI ABDOMEN WITHOUT CONTRAST  (INCLUDING MRCP) TECHNIQUE: Multiplanar multisequence MR imaging of the abdomen was performed. Heavily T2-weighted images of the biliary and pancreatic ducts were obtained, and three-dimensional MRCP images were rendered by post processing. COMPARISON:  CT AP 01/10/2022 and gallbladder sonogram 01/10/2022 FINDINGS: Lower chest: No acute  abnormality. Hepatobiliary: Hepatic steatosis. No focal liver lesion identified. There are stones noted within the dependent portion of the gallbladder neck which measure up to 5 mm. Gallbladder wall edema measures 6 mm in thickness, image 22/5. No pericholecystic fluid. The common bile duct measures 7 mm in maximum diameter. Motion artifact diminishes detail within the common bile duct and limits assessment for underlying choledocholithiasis. The MRCP images are essentially nondiagnostic. On the coronal T2 weighted  sequences (image 16/4) and the axial T2 weighted sequence (image 24/3) there is a suspect 7 filling defect at the level of the ampulla corresponding to the stone noted on 01/10/2022 CT of the abdomen pelvis. Review of the more remote CT from 01/19/2022 also shows a suspected stone within the distal CBD. Pancreas: Fatty infiltration of the pancreas. No signs of pancreatic inflammation, main duct dilatation or mass. Spleen:  Within normal limits in size and appearance. 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 Normal right adrenal gland. Bilateral simple appearing kidney cysts. No follow-up imaging recommended. No signs of hydronephrosis. Stomach/Bowel: Visualized portions within the abdomen are unremarkable. Vascular/Lymphatic:  No aneurysm.  No signs of abdominal adenopathy. Other: No significant free fluid or fluid collections. There is edema identified along the right lateral abdominal wall and left posterolateral abdominal flank. Musculoskeletal: No suspicious bone lesions identified. IMPRESSION: 1. Gallstones and mild gallbladder wall edema. No pericholecystic fluid. 2. Motion artifact diminishes detail within the common bile duct and limits assessment for underlying choledocholithiasis. On the coronal T2 weighted sequences and the axial T2 weighted sequence there is a suspect filling defect at the level of the  ampulla corresponding to the stone noted on 01/10/2022 CT of the abdomen pelvis. Review of the more remote CT from 01/19/2022 also shows a suspected stone within the distal CBD. 3. Hepatic steatosis. 4. Known left adrenal gland metastasis appears unchanged from 01/10/2022 CT of the abdomen/pelvis. Electronically Signed   By: Kerby Moors M.D.   On: 01/13/2022 11:49   MR 3D Recon At Scanner  Result Date: 01/13/2022 CLINICAL DATA:  Right upper quadrant pain.  History of lung cancer. EXAM: MRI ABDOMEN WITHOUT CONTRAST  (INCLUDING MRCP) TECHNIQUE: Multiplanar multisequence MR imaging of the abdomen was performed. Heavily T2-weighted images of the biliary and pancreatic ducts were obtained, and three-dimensional MRCP images were rendered by post processing. COMPARISON:  CT AP 01/10/2022 and gallbladder sonogram 01/10/2022 FINDINGS: Lower chest: No acute abnormality. Hepatobiliary: Hepatic steatosis. No focal liver lesion identified. There are stones noted within the dependent portion of the gallbladder neck which measure up to 5 mm. Gallbladder wall edema measures 6 mm in thickness, image 22/5. No pericholecystic fluid. The common bile duct measures 7 mm in maximum diameter. Motion artifact diminishes detail within the common bile duct and limits assessment for underlying choledocholithiasis. The MRCP images are essentially nondiagnostic. On the coronal T2 weighted sequences (image 16/4) and the axial T2 weighted sequence (image 24/3) there is a suspect 7 filling defect at the level of the ampulla corresponding to the stone noted on 01/10/2022 CT of the abdomen pelvis. Review of the more remote CT from 01/19/2022 also shows a suspected stone within the distal CBD. Pancreas: Fatty infiltration of the pancreas. No signs of pancreatic inflammation, main duct dilatation or mass. Spleen:  Within normal limits in size and appearance. 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 Normal right adrenal gland. Bilateral simple appearing kidney cysts. No follow-up imaging recommended. No signs of hydronephrosis. Stomach/Bowel: Visualized portions within the abdomen are unremarkable. Vascular/Lymphatic:  No aneurysm.  No signs of abdominal adenopathy. Other: No significant free fluid or fluid collections. There is edema identified along the right lateral abdominal wall and left posterolateral abdominal flank. Musculoskeletal: No suspicious bone lesions identified. IMPRESSION: 1. Gallstones and mild gallbladder wall edema. No pericholecystic fluid. 2. Motion artifact  diminishes detail within the common bile duct and limits assessment for underlying choledocholithiasis. On the coronal T2 weighted sequences and the axial T2 weighted sequence there is a suspect filling defect at the level of the ampulla corresponding to the stone noted on 01/10/2022 CT of the abdomen pelvis. Review of the more remote CT from 01/19/2022 also shows a suspected stone within the distal CBD. 3. Hepatic steatosis. 4. Known left adrenal gland metastasis appears unchanged from 01/10/2022 CT of the abdomen/pelvis. Electronically Signed   By: Kerby Moors M.D.   On: 01/13/2022 11:49      LOS: 4 days    Cordelia Poche, MD [facility] 01/14/2022, 10:17 AM   If 7PM-7AM, please contact night-coverage [email or web address]

## 2022-01-14 NOTE — Progress Notes (Signed)
[facility] Progress Note  1 Day Post-Op  Subjective: CC-  S/p successful ERCP yesterday with removal of stone.  Feeling about the same as yesterday. Continues to have diffuse abdominal pain, states that it is mostly in his lower abdomen today. No nausea or vomiting.  WBC 4.6, LFTs trending down, VSS.  Objective: Vital signs in last 24 hours: Temp:  [97.5 F (36.4 C)-98.3 F (36.8 C)] 98.3 F (36.8 C) (11/10 0820) Pulse Rate:  [56-86] 56 (11/10 0800) Resp:  [12-24] 18 (11/10 0800) BP: (107-154)/(49-100) 139/79 (11/10 0800) SpO2:  [88 %-100 %] 97 % (11/10 0800) Last BM Date : 01/13/22  Intake/Output from previous day: 11/09 0701 - 11/10 0700 In: 952 [P.O.:220; I.V.:600; IV Piggyback:132] Out: -  Intake/Output this shift: Total I/O In: 250.1 [IV Piggyback:250.1] Out: -   PE: General: Alert, NAD Heart: RRR Lungs: Respiratory effort nonlabored on supplemental O2 Abd: Obese, soft, mildly diffusely tender without peritoneal signs. Well healed surgical scars  Lab Results:  Recent Labs    01/13/22 0345 01/14/22 0333  WBC 9.6 4.6  HGB 11.9* 11.9*  HCT 36.3* 36.1*  PLT 115* 128*   BMET Recent Labs    01/13/22 0345 01/14/22 0333  NA 142 140  K 3.5 4.0  CL 111 109  CO2 25 27  GLUCOSE 98 175*  BUN 15 20  CREATININE 1.03 0.98  CALCIUM 8.4* 8.2*   PT/INR Recent Labs    01/12/22 0324 01/13/22 0345  LABPROT 16.5* 13.8  INR 1.3* 1.1   CMP     Component Value Date/Time   NA 140 01/14/2022 0333   K 4.0 01/14/2022 0333   CL 109 01/14/2022 0333   CO2 27 01/14/2022 0333   GLUCOSE 175 (H) 01/14/2022 0333   BUN 20 01/14/2022 0333   CREATININE 0.98 01/14/2022 0333   CREATININE 1.20 10/19/2021 1150   CREATININE 1.09 07/22/2016 1111   CALCIUM 8.2 (L) 01/14/2022 0333   PROT 6.1 (L) 01/14/2022 0333   ALBUMIN 2.6 (L) 01/14/2022 0333   AST 35 01/14/2022 0333   AST 22 10/19/2021 1150   ALT 86 (H) 01/14/2022 0333   ALT 36 10/19/2021 1150   ALKPHOS 88  01/14/2022 0333   BILITOT 1.5 (H) 01/14/2022 0333   BILITOT 0.8 10/19/2021 1150   GFRNONAA >60 01/14/2022 0333   GFRNONAA >60 10/19/2021 1150   GFRAA >60 01/16/2017 0405   Lipase     Component Value Date/Time   LIPASE 45 01/10/2022 1448       Studies/Results: DG ERCP  Result Date: 01/13/2022 CLINICAL DATA:  ERCP. EXAM: ERCP TECHNIQUE: Multiple spot images obtained with the fluoroscopic device and submitted for interpretation post-procedure. FLUOROSCOPY TIME: FLUOROSCOPY TIME 3 minutes, 27 seconds (111.3 mGy) COMPARISON:  MRCP-01/13/2022 CT abdomen and pelvis-01/10/2022 FINDINGS: 15 spot intraoperative fluoroscopic images the right upper abdominal quadrant during ERCP are provided for review Initial image demonstrates an ERCP probe overlying the right upper abdominal quadrant. Initial images demonstrate selective cannulation and opacification of the common bile duct. Subsequent images demonstrate insufflation of a balloon within the central aspect of the CBD. Balloon cholangiogram demonstrates a persistent nonocclusive filling defect within the distal aspect of the CBD (image 7) with subsequent biliary sweeping and presumed sphincterotomy. There is minimal opacification of the central aspect of the intrahepatic biliary tree which appears nondilated. There is opacification of the cystic duct and faint opacification of the gallbladder lumen. There is no definitive opacification of the pancreatic duct. IMPRESSION: ERCP with biliary sweeping, stone  extraction and presumed sphincterotomy as detailed above. These images were submitted for radiologic interpretation only. Please see the procedural report for the amount of contrast and the fluoroscopy time utilized. Electronically Signed   By: Sandi Mariscal M.D.   On: 01/13/2022 15:14   MR ABDOMEN MRCP WO CONTRAST  Result Date: 01/13/2022 CLINICAL DATA:  Right upper quadrant pain.  History of lung cancer. EXAM: MRI ABDOMEN WITHOUT CONTRAST  (INCLUDING  MRCP) TECHNIQUE: Multiplanar multisequence MR imaging of the abdomen was performed. Heavily T2-weighted images of the biliary and pancreatic ducts were obtained, and three-dimensional MRCP images were rendered by post processing. COMPARISON:  CT AP 01/10/2022 and gallbladder sonogram 01/10/2022 FINDINGS: Lower chest: No acute abnormality. Hepatobiliary: Hepatic steatosis. No focal liver lesion identified. There are stones noted within the dependent portion of the gallbladder neck which measure up to 5 mm. Gallbladder wall edema measures 6 mm in thickness, image 22/5. No pericholecystic fluid. The common bile duct measures 7 mm in maximum diameter. Motion artifact diminishes detail within the common bile duct and limits assessment for underlying choledocholithiasis. The MRCP images are essentially nondiagnostic. On the coronal T2 weighted sequences (image 16/4) and the axial T2 weighted sequence (image 24/3) there is a suspect 7 filling defect at the level of the ampulla corresponding to the stone noted on 01/10/2022 CT of the abdomen pelvis. Review of the more remote CT from 01/19/2022 also shows a suspected stone within the distal CBD. Pancreas: Fatty infiltration of the pancreas. No signs of pancreatic inflammation, main duct dilatation or mass. Spleen:  Within normal limits in size and appearance. 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 Normal right adrenal gland. Bilateral simple appearing kidney cysts. No follow-up imaging recommended. No signs of hydronephrosis. Stomach/Bowel: Visualized portions within the abdomen are unremarkable. Vascular/Lymphatic:  No aneurysm.  No signs of abdominal adenopathy. Other: No significant free fluid or fluid collections. There is edema identified along the right lateral abdominal wall and left posterolateral abdominal flank. Musculoskeletal: No suspicious bone lesions identified.  IMPRESSION: 1. Gallstones and mild gallbladder wall edema. No pericholecystic fluid. 2. Motion artifact diminishes detail within the common bile duct and limits assessment for underlying choledocholithiasis. On the coronal T2 weighted sequences and the axial T2 weighted sequence there is a suspect filling defect at the level of the ampulla corresponding to the stone noted on 01/10/2022 CT of the abdomen pelvis. Review of the more remote CT from 01/19/2022 also shows a suspected stone within the distal CBD. 3. Hepatic steatosis. 4. Known left adrenal gland metastasis appears unchanged from 01/10/2022 CT of the abdomen/pelvis. Electronically Signed   By: Kerby Moors M.D.   On: 01/13/2022 11:49   MR 3D Recon At Scanner  Result Date: 01/13/2022 CLINICAL DATA:  Right upper quadrant pain.  History of lung cancer. EXAM: MRI ABDOMEN WITHOUT CONTRAST  (INCLUDING MRCP) TECHNIQUE: Multiplanar multisequence MR imaging of the abdomen was performed. Heavily T2-weighted images of the biliary and pancreatic ducts were obtained, and three-dimensional MRCP images were rendered by post processing. COMPARISON:  CT AP 01/10/2022 and gallbladder sonogram 01/10/2022 FINDINGS: Lower chest: No acute abnormality. Hepatobiliary: Hepatic steatosis. No focal liver lesion identified. There are stones noted within the dependent portion of the gallbladder neck which measure up to 5 mm. Gallbladder wall edema measures 6 mm in thickness, image 22/5. No pericholecystic fluid. The common bile duct measures 7 mm in maximum diameter. Motion artifact diminishes detail within the  common bile duct and limits assessment for underlying choledocholithiasis. The MRCP images are essentially nondiagnostic. On the coronal T2 weighted sequences (image 16/4) and the axial T2 weighted sequence (image 24/3) there is a suspect 7 filling defect at the level of the ampulla corresponding to the stone noted on 01/10/2022 CT of the abdomen pelvis. Review of the more  remote CT from 01/19/2022 also shows a suspected stone within the distal CBD. Pancreas: Fatty infiltration of the pancreas. No signs of pancreatic inflammation, main duct dilatation or mass. Spleen:  Within normal limits in size and appearance. 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 Normal right adrenal gland. Bilateral simple appearing kidney cysts. No follow-up imaging recommended. No signs of hydronephrosis. Stomach/Bowel: Visualized portions within the abdomen are unremarkable. Vascular/Lymphatic:  No aneurysm.  No signs of abdominal adenopathy. Other: No significant free fluid or fluid collections. There is edema identified along the right lateral abdominal wall and left posterolateral abdominal flank. Musculoskeletal: No suspicious bone lesions identified. IMPRESSION: 1. Gallstones and mild gallbladder wall edema. No pericholecystic fluid. 2. Motion artifact diminishes detail within the common bile duct and limits assessment for underlying choledocholithiasis. On the coronal T2 weighted sequences and the axial T2 weighted sequence there is a suspect filling defect at the level of the ampulla corresponding to the stone noted on 01/10/2022 CT of the abdomen pelvis. Review of the more remote CT from 01/19/2022 also shows a suspected stone within the distal CBD. 3. Hepatic steatosis. 4. Known left adrenal gland metastasis appears unchanged from 01/10/2022 CT of the abdomen/pelvis. Electronically Signed   By: Kerby Moors M.D.   On: 01/13/2022 11:49    Anti-infectives: Anti-infectives (From admission, onward)    Start     Dose/Rate Route Frequency Ordered Stop   01/13/22 1400  Ampicillin-Sulbactam (UNASYN) 3 g in sodium chloride 0.9 % 100 mL IVPB        3 g 200 mL/hr over 30 Minutes Intravenous Every 6 hours 01/13/22 1018     01/12/22 1500  piperacillin-tazobactam (ZOSYN) IVPB 3.375 g  Status:  Discontinued         3.375 g 12.5 mL/hr over 240 Minutes Intravenous Every 8 hours 01/12/22 1337 01/13/22 1018   01/11/22 1400  vancomycin (VANCOREADY) IVPB 1500 mg/300 mL  Status:  Discontinued        1,500 mg 150 mL/hr over 120 Minutes Intravenous Every 24 hours 01/10/22 1611 01/11/22 0533   01/11/22 1300  cefTRIAXone (ROCEPHIN) 2 g in sodium chloride 0.9 % 100 mL IVPB  Status:  Discontinued        2 g 200 mL/hr over 30 Minutes Intravenous Every 24 hours 01/11/22 0533 01/12/22 1337   01/11/22 1300  vancomycin (VANCOREADY) IVPB 1250 mg/250 mL  Status:  Discontinued        1,250 mg 166.7 mL/hr over 90 Minutes Intravenous Every 24 hours 01/11/22 0922 01/12/22 1337   01/10/22 2200  ceFEPIme (MAXIPIME) 2 g in sodium chloride 0.9 % 100 mL IVPB  Status:  Discontinued        2 g 200 mL/hr over 30 Minutes Intravenous Every 8 hours 01/10/22 1521 01/11/22 0533   01/10/22 2200  metroNIDAZOLE (FLAGYL) IVPB 500 mg  Status:  Discontinued        500 mg 100 mL/hr over 60 Minutes Intravenous Every 12 hours 01/10/22 1521 01/11/22 0824   01/10/22 1230  ceFEPIme (MAXIPIME) 2 g in sodium chloride 0.9 %  100 mL IVPB        2 g 200 mL/hr over 30 Minutes Intravenous  Once 01/10/22 1221 01/10/22 1324   01/10/22 1230  metroNIDAZOLE (FLAGYL) IVPB 500 mg        500 mg 100 mL/hr over 60 Minutes Intravenous  Once 01/10/22 1221 01/10/22 1325   01/10/22 1230  vancomycin (VANCOCIN) IVPB 1000 mg/200 mL premix  Status:  Discontinued        1,000 mg 200 mL/hr over 60 Minutes Intravenous  Once 01/10/22 1221 01/10/22 1228   01/10/22 1230  vancomycin (VANCOREADY) IVPB 2000 mg/400 mL        2,000 mg 200 mL/hr over 120 Minutes Intravenous  Once 01/10/22 1228 01/10/22 1537        Assessment/Plan Cholelithiasis, choledocholithiasis, possible cholangitis   - CT 11/6 with choledocholithiasis - RUQ [place] 11/6 without cholecystitis or definitive choledocholithiasis -S/P ERCP 11/9: Choledocholithiasis found, extension sphincterotomy and common  bile duct stone extraction, filling of the biliary tree and cystic duct but not the gallbladder, erosive duodenitis -LFTs continue to down trend -Need to consider laparoscopic cholecystectomy as he is at risk for repeated episodes (this is his second). Reaching out to oncology Dr. Julien Nordmann regarding patient's prognosis for stage IV non-small cell right lung cancer as this will weigh into our decision to offer surgery or not. Patient unsure if he would want to undergo surgery or not, wants to discuss with his sister and oncologist. Appears he is on hospice but remains full code. Given his surgical history he is at higher risk for complications. Patient also found to have nonfilling gallbladder yesterday concerning for cholecystitis. If we are not going to do cholecystectomy may need IR for perc chole tube placement. Continue IV antibiotics for now. Discussed with primary team.  -Primary team has reached out to IR regarding port removal due to bacteremia  FEN: npo, IVF ID: [email or web address] 11/6> VTE: Lovenox   I reviewed Consultant GI notes, hospitalist notes, last 24 h vitals and pain scores, last 48 h intake and output, last 24 h labs and trends, and last 24 h imaging results.    LOS: 4 days    [facility] 01/14/2022, 9:56 AM Please see Amion for pager number during day hours 7:00am-4:30pm

## 2022-01-14 NOTE — Progress Notes (Signed)
[facility] Progress Note  CC:  Elevated LFTs, CBD stone  Subjective: Not passing much flatus, but had a liquid bowel movement today.  Still having what he describes as generalized abdominal pain, but definitely feeling better.  Still feels weak.  Objective:  Vital signs in last 24 hours: Temp:  [97.5 F (36.4 C)-98.3 F (36.8 C)] 98.3 F (36.8 C) (11/10 0820) Pulse Rate:  [56-86] 65 (11/10 1000) Resp:  [13-24] 16 (11/10 1000) BP: (112-154)/(49-100) 132/82 (11/10 1000) SpO2:  [88 %-100 %] 94 % (11/10 1000) Last BM Date : 01/13/22 General:  Alert, chronically ill-appearing, in NAD Heart:  Regular rate and rhythm; no murmurs Pulm:  CTAB.  No W/R/R. Abdomen:  Soft, obese.  BS present, somewhat high-pitched and tinkling.  Scar from previous abdominal surgery noted.  Minimal diffuse TTP. Extremities:  Without edema.  Intake/Output from previous day: 11/09 0701 - 11/10 0700 In: 952 [P.O.:220; I.V.:600; IV Piggyback:132] Out: -  Intake/Output this shift: Total I/O In: 250.1 [IV Piggyback:250.1] Out: -   Lab Results: Recent Labs    01/12/22 0324 01/13/22 0345 01/14/22 0333  WBC 12.1* 9.6 4.6  HGB 11.3* 11.9* 11.9*  HCT 34.4* 36.3* 36.1*  PLT 102* 115* 128*   BMET Recent Labs    01/12/22 0324 01/13/22 0345 01/14/22 0333  NA 139 142 140  K 3.5 3.5 4.0  CL 109 111 109  CO2 _0 GLUCOSE 110* 98 175*  BUN _1 CREATININE 1.17 1.03 0.98  CALCIUM 7.9* 8.4* 8.2*   LFT Recent Labs    01/14/22 0333  PROT 6.1*  ALBUMIN 2.6*  AST 35  ALT 86*  ALKPHOS 88  BILITOT 1.5*  BILIDIR 0.5*  IBILI 1.0*   PT/INR Recent Labs    01/12/22 0324 01/13/22 0345  LABPROT 16.5* 13.8  INR 1.3* 1.1   DG ERCP  Result Date: 01/13/2022 CLINICAL DATA:  ERCP. EXAM: ERCP TECHNIQUE: Multiple spot images obtained with the fluoroscopic device and submitted for interpretation post-procedure. FLUOROSCOPY TIME: FLUOROSCOPY TIME 3 minutes, 27 seconds (111.3  mGy) COMPARISON:  MRCP-01/13/2022 CT abdomen and pelvis-01/10/2022 FINDINGS: 15 spot intraoperative fluoroscopic images the right upper abdominal quadrant during ERCP are provided for review Initial image demonstrates an ERCP probe overlying the right upper abdominal quadrant. Initial images demonstrate selective cannulation and opacification of the common bile duct. Subsequent images demonstrate insufflation of a balloon within the central aspect of the CBD. Balloon cholangiogram demonstrates a persistent nonocclusive filling defect within the distal aspect of the CBD (image 7) with subsequent biliary sweeping and presumed sphincterotomy. There is minimal opacification of the central aspect of the intrahepatic biliary tree which appears nondilated. There is opacification of the cystic duct and faint opacification of the gallbladder lumen. There is no definitive opacification of the pancreatic duct. IMPRESSION: ERCP with biliary sweeping, stone extraction and presumed sphincterotomy as detailed above. These images were submitted for radiologic interpretation only. Please see the procedural report for the amount of contrast and the fluoroscopy time utilized. Electronically Signed   By: Sandi Mariscal M.D.   On: 01/13/2022 15:14   MR ABDOMEN MRCP WO CONTRAST  Result Date: 01/13/2022 CLINICAL DATA:  Right upper quadrant pain.  History of lung cancer. EXAM: MRI ABDOMEN WITHOUT CONTRAST  (INCLUDING MRCP) TECHNIQUE: Multiplanar multisequence MR imaging of the abdomen was performed. Heavily T2-weighted images of the biliary and pancreatic ducts were obtained, and three-dimensional MRCP images were rendered by post processing. COMPARISON:  CT AP  01/10/2022 and gallbladder sonogram 01/10/2022 FINDINGS: Lower chest: No acute abnormality. Hepatobiliary: Hepatic steatosis. No focal liver lesion identified. There are stones noted within the dependent portion of the gallbladder neck which measure up to 5 mm. Gallbladder wall  edema measures 6 mm in thickness, image 22/5. No pericholecystic fluid. The common bile duct measures 7 mm in maximum diameter. Motion artifact diminishes detail within the common bile duct and limits assessment for underlying choledocholithiasis. The MRCP images are essentially nondiagnostic. On the coronal T2 weighted sequences (image 16/4) and the axial T2 weighted sequence (image 24/3) there is a suspect 7 filling defect at the level of the ampulla corresponding to the stone noted on 01/10/2022 CT of the abdomen pelvis. Review of the more remote CT from 01/19/2022 also shows a suspected stone within the distal CBD. Pancreas: Fatty infiltration of the pancreas. No signs of pancreatic inflammation, main duct dilatation or mass. Spleen:  Within normal limits in size and appearance. 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 Normal right adrenal gland. Bilateral simple appearing kidney cysts. No follow-up imaging recommended. No signs of hydronephrosis. Stomach/Bowel: Visualized portions within the abdomen are unremarkable. Vascular/Lymphatic:  No aneurysm.  No signs of abdominal adenopathy. Other: No significant free fluid or fluid collections. There is edema identified along the right lateral abdominal wall and left posterolateral abdominal flank. Musculoskeletal: No suspicious bone lesions identified. IMPRESSION: 1. Gallstones and mild gallbladder wall edema. No pericholecystic fluid. 2. Motion artifact diminishes detail within the common bile duct and limits assessment for underlying choledocholithiasis. On the coronal T2 weighted sequences and the axial T2 weighted sequence there is a suspect filling defect at the level of the ampulla corresponding to the stone noted on 01/10/2022 CT of the abdomen pelvis. Review of the more remote CT from 01/19/2022 also shows a suspected stone within the distal CBD. 3. Hepatic steatosis.  4. Known left adrenal gland metastasis appears unchanged from 01/10/2022 CT of the abdomen/pelvis. Electronically Signed   By: Kerby Moors M.D.   On: 01/13/2022 11:49   MR 3D Recon At Scanner  Result Date: 01/13/2022 CLINICAL DATA:  Right upper quadrant pain.  History of lung cancer. EXAM: MRI ABDOMEN WITHOUT CONTRAST  (INCLUDING MRCP) TECHNIQUE: Multiplanar multisequence MR imaging of the abdomen was performed. Heavily T2-weighted images of the biliary and pancreatic ducts were obtained, and three-dimensional MRCP images were rendered by post processing. COMPARISON:  CT AP 01/10/2022 and gallbladder sonogram 01/10/2022 FINDINGS: Lower chest: No acute abnormality. Hepatobiliary: Hepatic steatosis. No focal liver lesion identified. There are stones noted within the dependent portion of the gallbladder neck which measure up to 5 mm. Gallbladder wall edema measures 6 mm in thickness, image 22/5. No pericholecystic fluid. The common bile duct measures 7 mm in maximum diameter. Motion artifact diminishes detail within the common bile duct and limits assessment for underlying choledocholithiasis. The MRCP images are essentially nondiagnostic. On the coronal T2 weighted sequences (image 16/4) and the axial T2 weighted sequence (image 24/3) there is a suspect 7 filling defect at the level of the ampulla corresponding to the stone noted on 01/10/2022 CT of the abdomen pelvis. Review of the more remote CT from 01/19/2022 also shows a suspected stone within the distal CBD. Pancreas: Fatty infiltration of the pancreas. No signs of pancreatic inflammation, main duct dilatation or mass. Spleen:  Within normal limits in size and appearance. 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 Normal right adrenal gland. Bilateral simple appearing kidney cysts. No follow-up imaging recommended. No signs of hydronephrosis. Stomach/Bowel: Visualized  portions within the abdomen are unremarkable. Vascular/Lymphatic:  No aneurysm.  No signs of abdominal adenopathy. Other: No significant free fluid or fluid collections. There is edema identified along the right lateral abdominal wall and left posterolateral abdominal flank. Musculoskeletal: No suspicious bone lesions identified. IMPRESSION: 1. Gallstones and mild gallbladder wall edema. No pericholecystic fluid. 2. Motion artifact diminishes detail within the common bile duct and limits assessment for underlying choledocholithiasis. On the coronal T2 weighted sequences and the axial T2 weighted sequence there is a suspect filling defect at the level of the ampulla corresponding to the stone noted on 01/10/2022 CT of the abdomen pelvis. Review of the more remote CT from 01/19/2022 also shows a suspected stone within the distal CBD. 3. Hepatic steatosis. 4. Known left adrenal gland metastasis appears unchanged from 01/10/2022 CT of the abdomen/pelvis. Electronically Signed   By: Kerby Moors M.D.   On: 01/13/2022 11:49    Assessment / Plan: 21) 66 year old male with a history of choledocholithiasis status post ERCP/sphincterotomy with stone extraction 02/2021 admitted to the hospital 11/6 with abdominal pain, diarrhea and fever of 102F.  WBC 9.5 -> 12.1. T. Bili 2.1 -> 4.9 -> 3.2. Direct bili 3.2 -> 2.1. AST 372 -> 375 -> 156. ALT 221 -> 286 -> 169. Alk phos 94 -> 76. Albumin 2.9 -> 2.5. Acute hepatitis panel negative. Lipase 45.  INR 1.4 -> 1.3. CTAP identified a few calcified stones in the gallbladder without biliary ductal dilatation with suspicion of a 4 mm stone at the ampulla. RUQ sono showed hepatic steatosis, cholelithiasis without evidence of acute cholecystitis and no choledocholithiasis. Sepsis secondary to possible cholecystitis vs ascending cholangitis. Blood cultures grew E. Coli, received Ceftriaxone and Metronidazole IV -> switched to Zosyn, now on unasyn since 11/9. General surgery consulted.  MRCP showed CBD stone.  ERCP with choledocholithiasis. Status post ERC with extension sphincterotomy and common bile duct stone extraction.  LFTs trending down. -Continue IV fluids. -Continue IV abx. -Trend labs. -Pain management per the hospitalist.   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 PLT 136 -> 102.   4) History of tubular adenomatous/hyperplastic polyps per colonoscopy 10/2014   5) History of GERD -Pantoprazole 40 mg IV every 24 hours   6) History of coronary artery disease s/p DES to the LAD and RCA 2006, inferior STEMI with thrombectomy and DES RCA 07/2016. Brilinta on hold since admission. Chronic diastolic CHF LV EF 60 - 75% with normal RV function per ECHO 01/11/2022. -Continue to hold Brilinta   7) Thoracic aortic aneurysm    8) Acute metabolic encephalopathy in setting of acute illness/sepsis   *Recommendations as above from GI standpoint.  Otherwise signing off.  Plans regarding possible cholecystectomy per surgery and oncology recommendations.    LOS: 4 days   Laban Emperor. Zaineb Nowaczyk  01/14/2022, 12:08 PM

## 2022-01-14 NOTE — Progress Notes (Addendum)
[facility]  Date of Admission:  01/10/2022           Reason for visit: Follow up on Bacteremia  Current antibiotics: Unasyn  ASSESSMENT:    66 y.o. male admitted with:  #E. coli and Enterococcus casseliflavus bacteremia #Choledocholithiasis with history of prior ERCP/sphincterotomy 02/2021 and status post repeat ERCP with sphincterotomy and CBD stone extraction 01/13/2022 #Suspected cholecystitis/cholangitis due to the above #Elevated LFTs due to above #Stage IV non-small cell lung cancer with PA-C in place.  Currently on observation therapy and followed by oncology  RECOMMENDATIONS:    Continue with Unasyn Suspect source due to biliary system given overall presentation status post ERCP with sphincterotomy and stone extraction 11/9. LFTs trending down Repeat blood cultures 11/9 no growth to date Recommend PAC removal in the setting of Enterococcus bacteremia Suspect endocarditis probably less likely given known source of infection, however, if possible would recommend we get a TEE to definitively exclude endocarditis as management would be quite different Surgery considering cholecystectomy possibly next week Discussed with [facility] Will follow.  Dr. Baxter Flattery is available as needed over the weekend.  I will be back on Monday.   Principal Problem:   Sepsis due to undetermined organism [facility]) Active Problems:   CAD S/P percutaneous coronary angioplasty   Essential hypertension   Dyslipidemia, goal LDL below 70   Chronic diastolic congestive heart failure (HCC)   Non-small cell carcinoma of right lung, stage 4 (HCC)   Thrombocytopenia (HCC)   COPD (chronic obstructive pulmonary disease) (HCC)   Abdominal pain   Cholelithiasis   Abnormal liver enzymes   Bacteremia associated with intravascular line ([place])    MEDICATIONS:    Scheduled Meds:  Chlorhexidine Gluconate Cloth  6 each Topical Daily   indomethacin  100 mg Rectal Once   lidocaine  1  patch Transdermal Q24H   morphine  60 mg Oral Q12H   mouth rinse  15 mL Mouth Rinse 4 times per day   sodium chloride flush  10-40 mL Intracatheter Q12H   Continuous Infusions:  ampicillin-sulbactam (UNASYN) IV Stopped (01/14/22 0813)   PRN Meds:.levalbuterol, LORazepam, naloxone **AND** sodium chloride flush, mouth rinse, mouth rinse, sodium chloride flush  SUBJECTIVE:   24 hour events:  Status post ERCP Reports ongoing pain No fevers Repeat blood cultures obtained yesterday   Patient aware of plan for PAC removal.  He is tolerating antibiotics.  He reports ongoing pain.  Review of Systems  All other systems reviewed and are negative.     OBJECTIVE:   Blood pressure 139/79, pulse (!) 56, temperature 98.3 F (36.8 C), temperature source Oral, resp. rate 18, height 5\' 9"  (1.753 m), weight 107.3 kg, SpO2 97 %. Body mass index is 34.93 kg/m.  Physical Exam Constitutional:      General: He is not in acute distress.    Comments: Chronically ill-appearing man, lying in bed, not in acute distress.  HENT:     Head: Normocephalic and atraumatic.  Cardiovascular:     Comments: Right chest wall Port-A-Cath in place Pulmonary:     Comments: He is mildly dyspneic on nasal cannula. Abdominal:     General: There is no distension.     Palpations: Abdomen is soft.     Tenderness: There is no abdominal tenderness.  Musculoskeletal:        General: Normal range of motion.  Skin:    General: Skin is warm and dry.  Neurological:  General: No focal deficit present.     Mental Status: He is alert. Mental status is at baseline.  Psychiatric:        Mood and Affect: Mood normal.        Behavior: Behavior normal.      Lab Results: Lab Results  Component Value Date   WBC 4.6 01/14/2022   HGB 11.9 (L) 01/14/2022   HCT 36.1 (L) 01/14/2022   MCV 96.3 01/14/2022   PLT 128 (L) 01/14/2022    Lab Results  Component Value Date   NA 140 01/14/2022   K 4.0 01/14/2022   CO2 27  01/14/2022   GLUCOSE 175 (H) 01/14/2022   BUN 20 01/14/2022   CREATININE 0.98 01/14/2022   CALCIUM 8.2 (L) 01/14/2022   GFRNONAA >60 01/14/2022   GFRAA >60 01/16/2017    Lab Results  Component Value Date   ALT 86 (H) 01/14/2022   AST 35 01/14/2022   ALKPHOS 88 01/14/2022   BILITOT 1.5 (H) 01/14/2022    No results found for: "CRP"  No results found for: "ESRSEDRATE"   I have reviewed the micro and lab results in Epic.  Imaging: DG ERCP  Result Date: 01/13/2022 CLINICAL DATA:  ERCP. EXAM: ERCP TECHNIQUE: Multiple spot images obtained with the fluoroscopic device and submitted for interpretation post-procedure. FLUOROSCOPY TIME: FLUOROSCOPY TIME 3 minutes, 27 seconds (111.3 mGy) COMPARISON:  MRCP-01/13/2022 CT abdomen and pelvis-01/10/2022 FINDINGS: 15 spot intraoperative fluoroscopic images the right upper abdominal quadrant during ERCP are provided for review Initial image demonstrates an ERCP probe overlying the right upper abdominal quadrant. Initial images demonstrate selective cannulation and opacification of the common bile duct. Subsequent images demonstrate insufflation of a balloon within the central aspect of the CBD. Balloon cholangiogram demonstrates a persistent nonocclusive filling defect within the distal aspect of the CBD (image 7) with subsequent biliary sweeping and presumed sphincterotomy. There is minimal opacification of the central aspect of the intrahepatic biliary tree which appears nondilated. There is opacification of the cystic duct and faint opacification of the gallbladder lumen. There is no definitive opacification of the pancreatic duct. IMPRESSION: ERCP with biliary sweeping, stone extraction and presumed sphincterotomy as detailed above. These images were submitted for radiologic interpretation only. Please see the procedural report for the amount of contrast and the fluoroscopy time utilized. Electronically Signed   By: Sandi Mariscal M.D.   On: 01/13/2022 15:14    MR ABDOMEN MRCP WO CONTRAST  Result Date: 01/13/2022 CLINICAL DATA:  Right upper quadrant pain.  History of lung cancer. EXAM: MRI ABDOMEN WITHOUT CONTRAST  (INCLUDING MRCP) TECHNIQUE: Multiplanar multisequence MR imaging of the abdomen was performed. Heavily T2-weighted images of the biliary and pancreatic ducts were obtained, and three-dimensional MRCP images were rendered by post processing. COMPARISON:  CT AP 01/10/2022 and gallbladder sonogram 01/10/2022 FINDINGS: Lower chest: No acute abnormality. Hepatobiliary: Hepatic steatosis. No focal liver lesion identified. There are stones noted within the dependent portion of the gallbladder neck which measure up to 5 mm. Gallbladder wall edema measures 6 mm in thickness, image 22/5. No pericholecystic fluid. The common bile duct measures 7 mm in maximum diameter. Motion artifact diminishes detail within the common bile duct and limits assessment for underlying choledocholithiasis. The MRCP images are essentially nondiagnostic. On the coronal T2 weighted sequences (image 16/4) and the axial T2 weighted sequence (image 24/3) there is a suspect 7 filling defect at the level of the ampulla corresponding to the stone noted on 01/10/2022 CT of the abdomen pelvis. Review  of the more remote CT from 01/19/2022 also shows a suspected stone within the distal CBD. Pancreas: Fatty infiltration of the pancreas. No signs of pancreatic inflammation, main duct dilatation or mass. Spleen:  Within normal limits in size and appearance. 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 Normal right adrenal gland. Bilateral simple appearing kidney cysts. No follow-up imaging recommended. No signs of hydronephrosis. Stomach/Bowel: Visualized portions within the abdomen are unremarkable. Vascular/Lymphatic:  No aneurysm.  No signs of abdominal adenopathy. Other: No significant free fluid or fluid  collections. There is edema identified along the right lateral abdominal wall and left posterolateral abdominal flank. Musculoskeletal: No suspicious bone lesions identified. IMPRESSION: 1. Gallstones and mild gallbladder wall edema. No pericholecystic fluid. 2. Motion artifact diminishes detail within the common bile duct and limits assessment for underlying choledocholithiasis. On the coronal T2 weighted sequences and the axial T2 weighted sequence there is a suspect filling defect at the level of the ampulla corresponding to the stone noted on 01/10/2022 CT of the abdomen pelvis. Review of the more remote CT from 01/19/2022 also shows a suspected stone within the distal CBD. 3. Hepatic steatosis. 4. Known left adrenal gland metastasis appears unchanged from 01/10/2022 CT of the abdomen/pelvis. Electronically Signed   By: Kerby Moors M.D.   On: 01/13/2022 11:49   MR 3D Recon At Scanner  Result Date: 01/13/2022 CLINICAL DATA:  Right upper quadrant pain.  History of lung cancer. EXAM: MRI ABDOMEN WITHOUT CONTRAST  (INCLUDING MRCP) TECHNIQUE: Multiplanar multisequence MR imaging of the abdomen was performed. Heavily T2-weighted images of the biliary and pancreatic ducts were obtained, and three-dimensional MRCP images were rendered by post processing. COMPARISON:  CT AP 01/10/2022 and gallbladder sonogram 01/10/2022 FINDINGS: Lower chest: No acute abnormality. Hepatobiliary: Hepatic steatosis. No focal liver lesion identified. There are stones noted within the dependent portion of the gallbladder neck which measure up to 5 mm. Gallbladder wall edema measures 6 mm in thickness, image 22/5. No pericholecystic fluid. The common bile duct measures 7 mm in maximum diameter. Motion artifact diminishes detail within the common bile duct and limits assessment for underlying choledocholithiasis. The MRCP images are essentially nondiagnostic. On the coronal T2 weighted sequences (image 16/4) and the axial T2 weighted  sequence (image 24/3) there is a suspect 7 filling defect at the level of the ampulla corresponding to the stone noted on 01/10/2022 CT of the abdomen pelvis. Review of the more remote CT from 01/19/2022 also shows a suspected stone within the distal CBD. Pancreas: Fatty infiltration of the pancreas. No signs of pancreatic inflammation, main duct dilatation or mass. Spleen:  Within normal limits in size and appearance. 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 Normal right adrenal gland. Bilateral simple appearing kidney cysts. No follow-up imaging recommended. No signs of hydronephrosis. Stomach/Bowel: Visualized portions within the abdomen are unremarkable. Vascular/Lymphatic:  No aneurysm.  No signs of abdominal adenopathy. Other: No significant free fluid or fluid collections. There is edema identified along the right lateral abdominal wall and left posterolateral abdominal flank. Musculoskeletal: No suspicious bone lesions identified. IMPRESSION: 1. Gallstones and mild gallbladder wall edema. No pericholecystic fluid. 2. Motion artifact diminishes detail within the common bile duct and limits assessment for underlying choledocholithiasis. On the coronal T2 weighted sequences and the axial T2 weighted sequence there is a suspect filling defect at the level of the ampulla  corresponding to the stone noted on 01/10/2022 CT of the abdomen pelvis. Review of the more remote CT from 01/19/2022 also shows a suspected stone within the distal CBD. 3. Hepatic steatosis. 4. Known left adrenal gland metastasis appears unchanged from 01/10/2022 CT of the abdomen/pelvis. Electronically Signed   By: Kerby Moors M.D.   On: 01/13/2022 11:49     Imaging independently reviewed in Epic.    [facility] [phone] pager 01/14/2022, 10:01 AM

## 2022-01-14 NOTE — Procedures (Signed)
Interventional Radiology Procedure Note  Procedure: Chest port removal   Indication: Bacteremia  Findings: Please refer to procedural dictation for full description.  Complications: None  EBL: < 10 mL  Miachel Roux, MD [phone]

## 2022-01-14 NOTE — Progress Notes (Signed)
Patient ID: [name] [name], male   DOB: 1955-12-20, 66 y.o.   MRN: [id number] Request received for Port-A-Cath removal in pt secondary to bacteremia.  Originally placed on 02/02/2021.  Port site looks clean and dry, nontender.  Above d/w with Dr. Julien Nordmann' s office who agrees with plan.  Details/risks of procedure, including but not limited to, internal bleeding, infection, injury to adjacent structures discussed with patient with his understanding and consent.  Procedure scheduled for today with local anesthesia.

## 2022-01-14 NOTE — Progress Notes (Signed)
    [name] [name] has been requested to perform a transesophageal echocardiogram on [name] [name] for bacteremia.  After careful review of history and examination, the risks and benefits of transesophageal echocardiogram have been explained including risks of esophageal damage, perforation (1:10,000 risk), bleeding, pharyngeal hematoma as well as other potential complications associated with conscious sedation including aspiration, arrhythmia, respiratory failure and death. Alternatives to treatment were discussed, questions were answered. Patient is willing to proceed.   Procedure is scheduled for 01/17/2022 at 10:30am with Dr. Radford Pax. Will place pre-procedural orders.  Darreld Mclean, PA-C 01/14/2022 12:17 PM

## 2022-01-15 DIAGNOSIS — D696 Thrombocytopenia, unspecified: Secondary | ICD-10-CM

## 2022-01-15 DIAGNOSIS — I10 Essential (primary) hypertension: Secondary | ICD-10-CM

## 2022-01-15 DIAGNOSIS — I5032 Chronic diastolic (congestive) heart failure: Secondary | ICD-10-CM

## 2022-01-15 DIAGNOSIS — R748 Abnormal levels of other serum enzymes: Secondary | ICD-10-CM | POA: Diagnosis not present

## 2022-01-15 DIAGNOSIS — G9341 Metabolic encephalopathy: Secondary | ICD-10-CM | POA: Insufficient documentation

## 2022-01-15 DIAGNOSIS — R1010 Upper abdominal pain, unspecified: Secondary | ICD-10-CM | POA: Diagnosis not present

## 2022-01-15 DIAGNOSIS — K8051 Calculus of bile duct without cholangitis or cholecystitis with obstruction: Secondary | ICD-10-CM

## 2022-01-15 DIAGNOSIS — E785 Hyperlipidemia, unspecified: Secondary | ICD-10-CM

## 2022-01-15 DIAGNOSIS — R7881 Bacteremia: Secondary | ICD-10-CM | POA: Diagnosis not present

## 2022-01-15 DIAGNOSIS — K8050 Calculus of bile duct without cholangitis or cholecystitis without obstruction: Secondary | ICD-10-CM | POA: Diagnosis not present

## 2022-01-15 LAB — CBC WITH DIFFERENTIAL/PLATELET
Abs Immature Granulocytes: 0.05 10*3/uL (ref 0.00–0.07)
Basophils Absolute: 0 10*3/uL (ref 0.0–0.1)
Basophils Relative: 0 %
Eosinophils Absolute: 0 10*3/uL (ref 0.0–0.5)
Eosinophils Relative: 0 %
HCT: 34.7 % — ABNORMAL LOW (ref 39.0–52.0)
Hemoglobin: 11.4 g/dL — ABNORMAL LOW (ref 13.0–17.0)
Immature Granulocytes: 1 %
Lymphocytes Relative: 12 %
Lymphs Abs: 1.1 10*3/uL (ref 0.7–4.0)
MCH: 31.8 pg (ref 26.0–34.0)
MCHC: 32.9 g/dL (ref 30.0–36.0)
MCV: 96.9 fL (ref 80.0–100.0)
Monocytes Absolute: 0.4 10*3/uL (ref 0.1–1.0)
Monocytes Relative: 4 %
Neutro Abs: 7.6 10*3/uL (ref 1.7–7.7)
Neutrophils Relative %: 83 %
Platelets: 145 10*3/uL — ABNORMAL LOW (ref 150–400)
RBC: 3.58 MIL/uL — ABNORMAL LOW (ref 4.22–5.81)
RDW: 13.8 % (ref 11.5–15.5)
WBC: 9.1 10*3/uL (ref 4.0–10.5)
nRBC: 0 % (ref 0.0–0.2)

## 2022-01-15 MED ORDER — CHLORHEXIDINE GLUCONATE CLOTH 2 % EX PADS
6.0000 | MEDICATED_PAD | Freq: Every day | CUTANEOUS | Status: DC
  Administered 2022-01-18 – 2022-01-21 (×3): 6 via TOPICAL

## 2022-01-15 MED ORDER — HYDROCODONE-ACETAMINOPHEN 5-325 MG PO TABS
1.0000 | ORAL_TABLET | ORAL | Status: DC | PRN
  Administered 2022-01-15 – 2022-01-21 (×16): 2 via ORAL
  Filled 2022-01-15 (×16): qty 2

## 2022-01-15 MED ORDER — HYDROCODONE-ACETAMINOPHEN 5-325 MG PO TABS
1.0000 | ORAL_TABLET | ORAL | Status: DC | PRN
  Administered 2022-01-15 (×2): 1 via ORAL
  Filled 2022-01-15 (×2): qty 1

## 2022-01-15 NOTE — Progress Notes (Signed)
PROGRESS NOTE    [name] [name]  WFU:[id number] DOB: 1955-09-09 DOA: 01/10/2022 PCP: [name], MD   Brief Narrative: [name] [name] is a 66 y.o. male with a history of depression, anxiety, chronic pain, osteoarthritis, COPD, hyperlipidemia, fibromyalgia, GERD, migraine, hypertension, obesity. Patient presented secondary to abdominal pain, nausea and vomiting with evidence of a CBD stone. Patient met sepsis criteria on admission and was found to have an E. Coli and Enterococcus casseliflavus bacteremia. Antibiotics tailored to cover bacteremia. GI plan performed ERCP on 11/9 with sphincterotomy and CBD stone extraction.   Assessment and Plan:  Choledocholithiasis Elevated AST/ALT with CT imaging significant for suspicion of a 4 mm stone at the ampulla. GI and general surgery consulted. AST/ALT/bilirubin trending down. ERCP with sphincterotomy and common bile duct stone extraction performed on 11/9. -GI recommendations: signing off -General surgery recommendations: Planning possible laparoscopic cholecystectomy  Sepsis Present on admission. Secondary to E. Coli and enterococcus casseliflavus bacteremia likely related to above source. Antibiotics as mentioned below.  E. Coli bacteremia Enterococcus casseliflavus bacteremia Patient empirically managed on Vancomycin, Flagyl and Cefepime. He was transitioned to Ceftriaxone and Vancomycin for E. Coli and GPCs noted on culture data and now transitioned to Zosyn with final identification of bacteria. ID consulted. Port-a-Cath removed on 11/10 by IR -Unasyn IV per ID -Transesophageal Echocardiogram pending  Acute metabolic encephalopathy Presumed secondary to sepsis. Resolved.  Chronic pain Patient's home regimen held on admission and patient started on a dilaudid PCA pump. -Continue MS Contin -Continue dilaudid PRN  Stage IV non-small cell right lung cancer Noted. Patient under observation. Per oncologist, patient is not  enrolled in hospice and expects at least 1-2 years life expectancy for patient. Oncology has no barriers to surgery if needed.  CAD History of PCI of RCA in 2018. Currently on Brilinta which is held secondary to plan for ERCP.  Chronic diastolic heart failure Noted. Currently without acute heart failure. Home Lasix held. -Daily weights and strict in/out  Primary hypertension Patient appears to be managed on diuretic monotherapy. Lasix held secondary to sepsis.  Hyperlipidemia Patient is on Crestor as an outpatient which was held on admission. Elevated AST/ALT secondary to choledocholithiasis -Continue to hold Crestor for now  COPD -Continue Xopenex PRN  Thrombocytopenia Trending up. Likely secondary to sepsis.  DVT prophylaxis: Lovenox Code Status:   Code Status: Full Code Family Communication: None at bedside. Called sister but no response Disposition Plan: Discharge home likely in 3-5 days pending transition to outpatient antibiotic regimen, specialist recommendations/management   Consultants:  General surgery Gastroenterology Infectious disease  Procedures:  11/9: ERCP 11/10: Chest port removal  Antimicrobials: Vancomycin Cefepime Flagyl Ceftriaxone Zosyn   Subjective: Abdominal pain. Otherwise no concerns.  Objective: BP (!) 131/51   Pulse (!) 59   Temp (!) 97.5 F (36.4 C) (Oral)   Resp 18   Ht _0  (1.753 m)   Wt 107.3 kg   SpO2 96%   BMI 34.93 kg/m   Examination:  General exam: Appears calm and comfortable Respiratory system: Clear to auscultation. Respiratory effort normal. Cardiovascular system: S1 & S2 heard, RRR. No murmurs, rubs, gallops or clicks. Gastrointestinal system: Abdomen is protuberant, soft and nontender. Normal bowel sounds heard. Central nervous system: Alert and oriented. No focal neurological deficits. Musculoskeletal: No calf tenderness Skin: No cyanosis. No rashes Psychiatry: Judgement and insight appear normal. Mood &  affect appropriate.    Data Reviewed: I have personally reviewed following labs and imaging studies  CBC Lab Results  Component Value Date   WBC 9.1 01/15/2022   RBC 3.58 (L) 01/15/2022   HGB 11.4 (L) 01/15/2022   HCT 34.7 (L) 01/15/2022   MCV 96.9 01/15/2022   MCH 31.8 01/15/2022   PLT 145 (L) 01/15/2022   MCHC 32.9 01/15/2022   RDW 13.8 01/15/2022   LYMPHSABS 1.1 01/15/2022   MONOABS 0.4 01/15/2022   EOSABS 0.0 01/15/2022   BASOSABS 0.0 71/08/2692     Last metabolic panel Lab Results  Component Value Date   NA 140 01/14/2022   K 4.0 01/14/2022   CL 109 01/14/2022   CO2 27 01/14/2022   BUN 20 01/14/2022   CREATININE 0.98 01/14/2022   GLUCOSE 175 (H) 01/14/2022   GFRNONAA >60 01/14/2022   GFRAA >60 01/16/2017   CALCIUM 8.2 (L) 01/14/2022   PHOS 3.8 02/08/2021   PROT 6.1 (L) 01/14/2022   ALBUMIN 2.6 (L) 01/14/2022   BILITOT 1.5 (H) 01/14/2022   ALKPHOS 88 01/14/2022   AST 35 01/14/2022   ALT 86 (H) 01/14/2022   ANIONGAP 4 (L) 01/14/2022    GFR: Estimated Creatinine Clearance: 89.5 mL/min (by C-G formula based on SCr of 0.98 mg/dL).  Recent Results (from the past 240 hour(s))  Blood Culture (routine x 2)     Status: Abnormal (Preliminary result)   Collection Time: 01/10/22 12:18 PM   Specimen: BLOOD  Result Value Ref Range Status   Specimen Description   Final    BLOOD LEFT ANTECUBITAL Performed at [facility] [address] [place], [place] [address]    Special Requests   Final    BOTTLES DRAWN AEROBIC AND ANAEROBIC Blood Culture adequate volume Performed at [facility] [address] [place], [place] [address]    Culture  Setup Time   Final    GRAM POSITIVE COCCI IN PAIRS IN BOTH AEROBIC AND ANAEROBIC BOTTLES CRITICAL VALUE NOTED.  VALUE IS CONSISTENT WITH PREVIOUSLY REPORTED AND CALLED VALUE.    Culture (A)  Final    ENTEROCOCCUS CASSELIFLAVUS Sent to [facility] for CONFIRMATION OF  identification and speciation. Sent to [facility] for further susceptibility testing. Performed at [facility], [place] [address], [place], [place] [address]    Report Status PENDING  Incomplete  Blood Culture (routine x 2)     Status: Abnormal   Collection Time: 01/10/22 12:36 PM   Specimen: BLOOD  Result Value Ref Range Status   Specimen Description   Final    BLOOD PORTA CATH Performed at [facility] [address] [place], [place] [address]    Special Requests   Final    BOTTLES DRAWN AEROBIC AND ANAEROBIC Blood Culture adequate volume Performed at [facility] [address], [place], [place] [address]    Culture  Setup Time   Final    GRAM POSITIVE COCCI IN PAIRS GRAM NEGATIVE RODS IN BOTH AEROBIC AND ANAEROBIC BOTTLES CRITICAL RESULT CALLED TO, READ BACK BY AND VERIFIED WITH: PHARMD D. POINDEXTER 01/11/22 _0  BY AB    Culture (A)  Final    ESCHERICHIA COLI CORRECTED RESULTS ENTEROCOCCUS CASSELIFLAVUS PREVIOUSLY REPORTED AS: ENTEROCOCCUS FAECALIS CORRECTED RESULTS CALLED TO: PHARMD TERRY G. [phone] FCP SUSCEPTIBILITIES PERFORMED ON PREVIOUS CULTURE WITHIN THE LAST 5 DAYS. Performed at [facility], [place] [address], [place], [place] [address]    Report Status 01/14/2022 FINAL  Final   Organism ID, Bacteria ESCHERICHIA COLI  Final      Susceptibility   Escherichia coli -  MIC*    AMPICILLIN 8 SENSITIVE Sensitive     CEFAZOLIN <=4 SENSITIVE Sensitive     CEFEPIME <=0.12 SENSITIVE Sensitive     CEFTAZIDIME <=1 SENSITIVE Sensitive     CEFTRIAXONE <=0.25 SENSITIVE Sensitive     CIPROFLOXACIN <=0.25 SENSITIVE Sensitive     GENTAMICIN <=1 SENSITIVE Sensitive     IMIPENEM <=0.25 SENSITIVE Sensitive     TRIMETH/SULFA <=20 SENSITIVE Sensitive     AMPICILLIN/SULBACTAM 4 SENSITIVE Sensitive     PIP/TAZO <=4 SENSITIVE Sensitive     * ESCHERICHIA COLI  Blood Culture ID Panel (Reflexed)     Status: Abnormal   Collection Time:  01/10/22 12:36 PM  Result Value Ref Range Status   Enterococcus faecalis NOT DETECTED NOT DETECTED Final   Enterococcus Faecium NOT DETECTED NOT DETECTED Final   Listeria monocytogenes NOT DETECTED NOT DETECTED Final   Staphylococcus species NOT DETECTED NOT DETECTED Final   Staphylococcus aureus (BCID) NOT DETECTED NOT DETECTED Final   Staphylococcus epidermidis NOT DETECTED NOT DETECTED Final   Staphylococcus lugdunensis NOT DETECTED NOT DETECTED Final   Streptococcus species NOT DETECTED NOT DETECTED Final   Streptococcus agalactiae NOT DETECTED NOT DETECTED Final   Streptococcus pneumoniae NOT DETECTED NOT DETECTED Final   Streptococcus pyogenes NOT DETECTED NOT DETECTED Final   A.calcoaceticus-baumannii NOT DETECTED NOT DETECTED Final   Bacteroides fragilis NOT DETECTED NOT DETECTED Final   Enterobacterales DETECTED (A) NOT DETECTED Final    Comment: Enterobacterales represent a large order of gram negative bacteria, not a single organism. CRITICAL RESULT CALLED TO, READ BACK BY AND VERIFIED WITH: PHARMD D. POINDEXTER 01/11/22 _0  BY AB    Enterobacter cloacae complex NOT DETECTED NOT DETECTED Final   Escherichia coli DETECTED (A) NOT DETECTED Final    Comment: CRITICAL RESULT CALLED TO, READ BACK BY AND VERIFIED WITH: PHARMD D. POINDEXTER 01/11/22 _1  BY AB    Klebsiella aerogenes NOT DETECTED NOT DETECTED Final   Klebsiella oxytoca NOT DETECTED NOT DETECTED Final   Klebsiella pneumoniae NOT DETECTED NOT DETECTED Final   Proteus species NOT DETECTED NOT DETECTED Final   Salmonella species NOT DETECTED NOT DETECTED Final   Serratia marcescens NOT DETECTED NOT DETECTED Final   Haemophilus influenzae NOT DETECTED NOT DETECTED Final   Neisseria meningitidis NOT DETECTED NOT DETECTED Final   Pseudomonas aeruginosa NOT DETECTED NOT DETECTED Final   Stenotrophomonas maltophilia NOT DETECTED NOT DETECTED Final   Candida albicans NOT DETECTED NOT DETECTED Final   Candida auris NOT  DETECTED NOT DETECTED Final   Candida glabrata NOT DETECTED NOT DETECTED Final   Candida krusei NOT DETECTED NOT DETECTED Final   Candida parapsilosis NOT DETECTED NOT DETECTED Final   Candida tropicalis NOT DETECTED NOT DETECTED Final   Cryptococcus neoformans/gattii NOT DETECTED NOT DETECTED Final   CTX-M ESBL NOT DETECTED NOT DETECTED Final   Carbapenem resistance IMP NOT DETECTED NOT DETECTED Final   Carbapenem resistance KPC NOT DETECTED NOT DETECTED Final   Carbapenem resistance NDM NOT DETECTED NOT DETECTED Final   Carbapenem resist OXA 48 LIKE NOT DETECTED NOT DETECTED Final   Carbapenem resistance VIM NOT DETECTED NOT DETECTED Final    Comment: Performed at [facility], [address], [place], [place] [address]  Resp Panel by RT-PCR (Flu A&B, Covid)     Status: None   Collection Time: 01/10/22  1:40 PM   Specimen: Nasal Swab  Result Value Ref Range Status   SARS Coronavirus 2 by RT PCR NEGATIVE NEGATIVE Final  Comment: (NOTE) SARS-CoV-2 target nucleic acids are NOT DETECTED.  The SARS-CoV-2 RNA is generally detectable in upper respiratory specimens during the acute phase of infection. The lowest concentration of SARS-CoV-2 viral copies this assay can detect is 138 copies/mL. A negative result does not preclude SARS-Cov-2 infection and should not be used as the sole basis for treatment or other patient management decisions. A negative result may occur with  improper specimen collection/handling, submission of specimen other than nasopharyngeal swab, presence of viral mutation(s) within the areas targeted by this assay, and inadequate number of viral copies(<138 copies/mL). A negative result must be combined with clinical observations, patient history, and epidemiological information. The expected result is Negative.  Fact Sheet for Patients:  EntrepreneurPulse.com.au  Fact Sheet for Healthcare Providers:   IncredibleEmployment.be  This test is no t yet approved or cleared by the [place] FDA and  has been authorized for detection and/or diagnosis of SARS-CoV-2 by FDA under an Emergency Use Authorization (EUA). This EUA will remain  in effect (meaning this test can be used) for the duration of the COVID-19 declaration under Section 564(b)(1) of the Act, 21 U.S.C.section 360bbb-3(b)(1), unless the authorization is terminated  or revoked sooner.       Influenza A by PCR NEGATIVE NEGATIVE Final   Influenza B by PCR NEGATIVE NEGATIVE Final    Comment: (NOTE) The Xpert Xpress SARS-CoV-2/FLU/RSV plus assay is intended as an aid in the diagnosis of influenza from Nasopharyngeal swab specimens and should not be used as a sole basis for treatment. Nasal washings and aspirates are unacceptable for Xpert Xpress SARS-CoV-2/FLU/RSV testing.  Fact Sheet for Patients: EntrepreneurPulse.com.au  Fact Sheet for Healthcare Providers: IncredibleEmployment.be  This test is not yet approved or cleared by the [place] FDA and has been authorized for detection and/or diagnosis of SARS-CoV-2 by FDA under an Emergency Use Authorization (EUA). This EUA will remain in effect (meaning this test can be used) for the duration of the COVID-19 declaration under Section 564(b)(1) of the Act, 21 U.S.C. section 360bbb-3(b)(1), unless the authorization is terminated or revoked.  Performed at [facility], [place] [address] [place], [place] [address]   Urine Culture     Status: Abnormal   Collection Time: 01/10/22  1:40 PM   Specimen: In/Out Cath Urine  Result Value Ref Range Status   Specimen Description   Final    IN/OUT CATH URINE Performed at [facility] [address] [place], [place] [address]    Special Requests   Final    NONE Performed at [facility], [place] [address]  [place], [place] [address]    Culture 3,000 COLONIES/mL ENTEROCOCCUS FAECALIS (A)  Final   Report Status 01/13/2022 FINAL  Final   Organism ID, Bacteria ENTEROCOCCUS FAECALIS (A)  Final      Susceptibility   Enterococcus faecalis - MIC*    AMPICILLIN <=2 SENSITIVE Sensitive     NITROFURANTOIN <=16 SENSITIVE Sensitive     VANCOMYCIN 1 SENSITIVE Sensitive     * 3,000 COLONIES/mL ENTEROCOCCUS FAECALIS  MRSA Next Gen by PCR, Nasal     Status: None   Collection Time: 01/10/22  6:03 PM   Specimen: Nasal Mucosa; Nasal Swab  Result Value Ref Range Status   MRSA by PCR Next Gen NOT DETECTED NOT DETECTED Final    Comment: (NOTE) The GeneXpert MRSA Assay (FDA approved for NASAL specimens only), is one component of a comprehensive MRSA colonization surveillance program. It is not intended  to diagnose MRSA infection nor to guide or monitor treatment for MRSA infections. Test performance is not FDA approved in patients less than 53 years old. Performed at [facility], [place] [address] [place], [place] [address]   Culture, blood (Routine X 2) w Reflex to ID Panel     Status: None (Preliminary result)   Collection Time: 01/13/22  6:37 AM   Specimen: BLOOD RIGHT ARM  Result Value Ref Range Status   Specimen Description   Final    BLOOD RIGHT ARM Performed at [facility] [address] [place], [place] [address]    Special Requests   Final    BOTTLES DRAWN AEROBIC AND ANAEROBIC Blood Culture adequate volume Performed at [facility] [address] [place], [place] [address]    Culture   Final    NO GROWTH 2 DAYS Performed at [facility] [address] [place], [place] [address]    Report Status PENDING  Incomplete  Culture, blood (Routine X 2) w Reflex to ID Panel     Status: None (Preliminary result)   Collection Time: 01/13/22  6:46 AM   Specimen: BLOOD  Result Value Ref Range Status   Specimen Description   Final     BLOOD BLOOD LEFT HAND Performed at [facility] [address] [place], [place] [address]    Special Requests   Final    BOTTLES DRAWN AEROBIC AND ANAEROBIC Blood Culture results may not be optimal due to an inadequate volume of blood received in culture bottles Performed at [facility] [address], [place], [place] [address]    Culture   Final    NO GROWTH 2 DAYS Performed at [facility] [address], [place], [place] [address]    Report Status PENDING  Incomplete      Radiology Studies: IR REMOVAL TUN ACCESS W/ PORT W/O FL MOD SED  Result Date: 01/14/2022 INDICATION: Bacteremia. Chest port placed by Dr. Vernard Gambles on 02/02/2021. It has been utilized without issues. EXAM: REMOVAL RIGHT IJ VEIN PORT-A-CATH MEDICATIONS: Patient is on inpatient course of antibiotics. ANESTHESIA/SEDATION: None COMPLICATIONS: None immediate. PROCEDURE: Informed written consent was obtained from the patient after a thorough discussion of the procedural risks, benefits and alternatives. All questions were addressed. Maximal Sterile Barrier Technique was utilized including caps, mask, sterile gowns, sterile gloves, sterile drape, hand hygiene and skin antiseptic. A timeout was performed prior to the initiation of the procedure. Right anterior upper chest prepped and draped in the usual sterile fashion. Following local lidocaine administration, an incision was made at the superior margin of the port pocket. The pocket was dissected, and the port and catheter were removed in their entirety. The incision was closed with deep and superficial absorbable suture and sealed with Dermabond. IMPRESSION: Successful right IJ vein Port-A-Cath explant. Electronically Signed   By: Miachel Roux M.D.   On: 01/14/2022 17:12   DG ERCP  Result Date: 01/13/2022 CLINICAL DATA:  ERCP. EXAM: ERCP TECHNIQUE: Multiple spot images obtained with the fluoroscopic device and submitted for  interpretation post-procedure. FLUOROSCOPY TIME: FLUOROSCOPY TIME 3 minutes, 27 seconds (111.3 mGy) COMPARISON:  MRCP-01/13/2022 CT abdomen and pelvis-01/10/2022 FINDINGS: 15 spot intraoperative fluoroscopic images the right upper abdominal quadrant during ERCP are provided for review Initial image demonstrates an ERCP probe overlying the right upper abdominal quadrant. Initial images demonstrate selective cannulation and opacification of the common bile duct. Subsequent images demonstrate insufflation of  a balloon within the central aspect of the CBD. Balloon cholangiogram demonstrates a persistent nonocclusive filling defect within the distal aspect of the CBD (image 7) with subsequent biliary sweeping and presumed sphincterotomy. There is minimal opacification of the central aspect of the intrahepatic biliary tree which appears nondilated. There is opacification of the cystic duct and faint opacification of the gallbladder lumen. There is no definitive opacification of the pancreatic duct. IMPRESSION: ERCP with biliary sweeping, stone extraction and presumed sphincterotomy as detailed above. These images were submitted for radiologic interpretation only. Please see the procedural report for the amount of contrast and the fluoroscopy time utilized. Electronically Signed   By: Sandi Mariscal M.D.   On: 01/13/2022 15:14   MR ABDOMEN MRCP WO CONTRAST  Result Date: 01/13/2022 CLINICAL DATA:  Right upper quadrant pain.  History of lung cancer. EXAM: MRI ABDOMEN WITHOUT CONTRAST  (INCLUDING MRCP) TECHNIQUE: Multiplanar multisequence MR imaging of the abdomen was performed. Heavily T2-weighted images of the biliary and pancreatic ducts were obtained, and three-dimensional MRCP images were rendered by post processing. COMPARISON:  CT AP 01/10/2022 and gallbladder sonogram 01/10/2022 FINDINGS: Lower chest: No acute abnormality. Hepatobiliary: Hepatic steatosis. No focal liver lesion identified. There are stones noted  within the dependent portion of the gallbladder neck which measure up to 5 mm. Gallbladder wall edema measures 6 mm in thickness, image 22/5. No pericholecystic fluid. The common bile duct measures 7 mm in maximum diameter. Motion artifact diminishes detail within the common bile duct and limits assessment for underlying choledocholithiasis. The MRCP images are essentially nondiagnostic. On the coronal T2 weighted sequences (image 16/4) and the axial T2 weighted sequence (image 24/3) there is a suspect 7 filling defect at the level of the ampulla corresponding to the stone noted on 01/10/2022 CT of the abdomen pelvis. Review of the more remote CT from 01/19/2022 also shows a suspected stone within the distal CBD. Pancreas: Fatty infiltration of the pancreas. No signs of pancreatic inflammation, main duct dilatation or mass. Spleen:  Within normal limits in size and appearance. 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 Normal right adrenal gland. Bilateral simple appearing kidney cysts. No follow-up imaging recommended. No signs of hydronephrosis. Stomach/Bowel: Visualized portions within the abdomen are unremarkable. Vascular/Lymphatic:  No aneurysm.  No signs of abdominal adenopathy. Other: No significant free fluid or fluid collections. There is edema identified along the right lateral abdominal wall and left posterolateral abdominal flank. Musculoskeletal: No suspicious bone lesions identified. IMPRESSION: 1. Gallstones and mild gallbladder wall edema. No pericholecystic fluid. 2. Motion artifact diminishes detail within the common bile duct and limits assessment for underlying choledocholithiasis. On the coronal T2 weighted sequences and the axial T2 weighted sequence there is a suspect filling defect at the level of the ampulla corresponding to the stone noted on 01/10/2022 CT of the abdomen pelvis. Review of the more  remote CT from 01/19/2022 also shows a suspected stone within the distal CBD. 3. Hepatic steatosis. 4. Known left adrenal gland metastasis appears unchanged from 01/10/2022 CT of the abdomen/pelvis. Electronically Signed   By: Kerby Moors M.D.   On: 01/13/2022 11:49   MR 3D Recon At Scanner  Result Date: 01/13/2022 CLINICAL DATA:  Right upper quadrant pain.  History of lung cancer. EXAM: MRI ABDOMEN WITHOUT CONTRAST  (INCLUDING MRCP) TECHNIQUE: Multiplanar multisequence MR imaging of the abdomen was performed. Heavily T2-weighted images of the biliary and pancreatic ducts were obtained, and  three-dimensional MRCP images were rendered by post processing. COMPARISON:  CT AP 01/10/2022 and gallbladder sonogram 01/10/2022 FINDINGS: Lower chest: No acute abnormality. Hepatobiliary: Hepatic steatosis. No focal liver lesion identified. There are stones noted within the dependent portion of the gallbladder neck which measure up to 5 mm. Gallbladder wall edema measures 6 mm in thickness, image 22/5. No pericholecystic fluid. The common bile duct measures 7 mm in maximum diameter. Motion artifact diminishes detail within the common bile duct and limits assessment for underlying choledocholithiasis. The MRCP images are essentially nondiagnostic. On the coronal T2 weighted sequences (image 16/4) and the axial T2 weighted sequence (image 24/3) there is a suspect 7 filling defect at the level of the ampulla corresponding to the stone noted on 01/10/2022 CT of the abdomen pelvis. Review of the more remote CT from 01/19/2022 also shows a suspected stone within the distal CBD. Pancreas: Fatty infiltration of the pancreas. No signs of pancreatic inflammation, main duct dilatation or mass. Spleen:  Within normal limits in size and appearance. 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 Normal right adrenal gland. Bilateral simple appearing  kidney cysts. No follow-up imaging recommended. No signs of hydronephrosis. Stomach/Bowel: Visualized portions within the abdomen are unremarkable. Vascular/Lymphatic:  No aneurysm.  No signs of abdominal adenopathy. Other: No significant free fluid or fluid collections. There is edema identified along the right lateral abdominal wall and left posterolateral abdominal flank. Musculoskeletal: No suspicious bone lesions identified. IMPRESSION: 1. Gallstones and mild gallbladder wall edema. No pericholecystic fluid. 2. Motion artifact diminishes detail within the common bile duct and limits assessment for underlying choledocholithiasis. On the coronal T2 weighted sequences and the axial T2 weighted sequence there is a suspect filling defect at the level of the ampulla corresponding to the stone noted on 01/10/2022 CT of the abdomen pelvis. Review of the more remote CT from 01/19/2022 also shows a suspected stone within the distal CBD. 3. Hepatic steatosis. 4. Known left adrenal gland metastasis appears unchanged from 01/10/2022 CT of the abdomen/pelvis. Electronically Signed   By: Kerby Moors M.D.   On: 01/13/2022 11:49      LOS: 5 days    Cordelia Poche, MD [facility] 01/15/2022, 10:00 AM   If 7PM-7AM, please contact night-coverage [email or web address]

## 2022-01-15 NOTE — Progress Notes (Signed)
2 Days Post-Op   Subjective/Chief Complaint: Complains of mostly lower abdominal pain Port removed yesterday   Objective: Vital signs in last 24 hours: Temp:  [97.5 F (36.4 C)-98.4 F (36.9 C)] 97.5 F (36.4 C) (11/11 0800) Pulse Rate:  [57-97] 61 (11/11 0500) Resp:  [11-27] 15 (11/11 0500) BP: (90-147)/(39-102) 147/102 (11/11 0500) SpO2:  [88 %-100 %] 88 % (11/11 0500) Last BM Date : 01/14/22  Intake/Output from previous day: 11/10 0701 - 11/11 0700 In: 589.3 [IV Piggyback:589.3] Out: 1250 [Urine:1250] Intake/Output this shift: No intake/output data recorded.  Exam: Awake and conversant but appears weak, slightly labored breathing Abdomen is morbidly obese with only mild central tenderness and no peritonitis   Lab Results:  Recent Labs    01/14/22 0333 01/15/22 0327  WBC 4.6 9.1  HGB 11.9* 11.4*  HCT 36.1* 34.7*  PLT 128* 145*   BMET Recent Labs    01/13/22 0345 01/14/22 0333  NA 142 140  K 3.5 4.0  CL 111 109  CO2 25 27  GLUCOSE 98 175*  BUN 15 20  CREATININE 1.03 0.98  CALCIUM 8.4* 8.2*   PT/INR Recent Labs    01/13/22 0345  LABPROT 13.8  INR 1.1   ABG No results for input(s): "PHART", "HCO3" in the last 72 hours.  Invalid input(s): "PCO2", "PO2"  Studies/Results: IR REMOVAL TUN ACCESS W/ PORT W/O FL MOD SED  Result Date: 01/14/2022 INDICATION: Bacteremia. Chest port placed by Dr. Vernard Gambles on 02/02/2021. It has been utilized without issues. EXAM: REMOVAL RIGHT IJ VEIN PORT-A-CATH MEDICATIONS: Patient is on inpatient course of antibiotics. ANESTHESIA/SEDATION: None COMPLICATIONS: None immediate. PROCEDURE: Informed written consent was obtained from the patient after a thorough discussion of the procedural risks, benefits and alternatives. All questions were addressed. Maximal Sterile Barrier Technique was utilized including caps, mask, sterile gowns, sterile gloves, sterile drape, hand hygiene and skin antiseptic. A timeout was performed prior  to the initiation of the procedure. Right anterior upper chest prepped and draped in the usual sterile fashion. Following local lidocaine administration, an incision was made at the superior margin of the port pocket. The pocket was dissected, and the port and catheter were removed in their entirety. The incision was closed with deep and superficial absorbable suture and sealed with Dermabond. IMPRESSION: Successful right IJ vein Port-A-Cath explant. Electronically Signed   By: Miachel Roux M.D.   On: 01/14/2022 17:12   DG ERCP  Result Date: 01/13/2022 CLINICAL DATA:  ERCP. EXAM: ERCP TECHNIQUE: Multiple spot images obtained with the fluoroscopic device and submitted for interpretation post-procedure. FLUOROSCOPY TIME: FLUOROSCOPY TIME 3 minutes, 27 seconds (111.3 mGy) COMPARISON:  MRCP-01/13/2022 CT abdomen and pelvis-01/10/2022 FINDINGS: 15 spot intraoperative fluoroscopic images the right upper abdominal quadrant during ERCP are provided for review Initial image demonstrates an ERCP probe overlying the right upper abdominal quadrant. Initial images demonstrate selective cannulation and opacification of the common bile duct. Subsequent images demonstrate insufflation of a balloon within the central aspect of the CBD. Balloon cholangiogram demonstrates a persistent nonocclusive filling defect within the distal aspect of the CBD (image 7) with subsequent biliary sweeping and presumed sphincterotomy. There is minimal opacification of the central aspect of the intrahepatic biliary tree which appears nondilated. There is opacification of the cystic duct and faint opacification of the gallbladder lumen. There is no definitive opacification of the pancreatic duct. IMPRESSION: ERCP with biliary sweeping, stone extraction and presumed sphincterotomy as detailed above. These images were submitted for radiologic interpretation only. Please see the procedural  report for the amount of contrast and the fluoroscopy time  utilized. Electronically Signed   By: Sandi Mariscal M.D.   On: 01/13/2022 15:14   MR ABDOMEN MRCP WO CONTRAST  Result Date: 01/13/2022 CLINICAL DATA:  Right upper quadrant pain.  History of lung cancer. EXAM: MRI ABDOMEN WITHOUT CONTRAST  (INCLUDING MRCP) TECHNIQUE: Multiplanar multisequence MR imaging of the abdomen was performed. Heavily T2-weighted images of the biliary and pancreatic ducts were obtained, and three-dimensional MRCP images were rendered by post processing. COMPARISON:  CT AP 01/10/2022 and gallbladder sonogram 01/10/2022 FINDINGS: Lower chest: No acute abnormality. Hepatobiliary: Hepatic steatosis. No focal liver lesion identified. There are stones noted within the dependent portion of the gallbladder neck which measure up to 5 mm. Gallbladder wall edema measures 6 mm in thickness, image 22/5. No pericholecystic fluid. The common bile duct measures 7 mm in maximum diameter. Motion artifact diminishes detail within the common bile duct and limits assessment for underlying choledocholithiasis. The MRCP images are essentially nondiagnostic. On the coronal T2 weighted sequences (image 16/4) and the axial T2 weighted sequence (image 24/3) there is a suspect 7 filling defect at the level of the ampulla corresponding to the stone noted on 01/10/2022 CT of the abdomen pelvis. Review of the more remote CT from 01/19/2022 also shows a suspected stone within the distal CBD. Pancreas: Fatty infiltration of the pancreas. No signs of pancreatic inflammation, main duct dilatation or mass. Spleen:  Within normal limits in size and appearance. 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 Normal right adrenal gland. Bilateral simple appearing kidney cysts. No follow-up imaging recommended. No signs of hydronephrosis. Stomach/Bowel: Visualized portions within the abdomen are unremarkable. Vascular/Lymphatic:  No aneurysm.  No  signs of abdominal adenopathy. Other: No significant free fluid or fluid collections. There is edema identified along the right lateral abdominal wall and left posterolateral abdominal flank. Musculoskeletal: No suspicious bone lesions identified. IMPRESSION: 1. Gallstones and mild gallbladder wall edema. No pericholecystic fluid. 2. Motion artifact diminishes detail within the common bile duct and limits assessment for underlying choledocholithiasis. On the coronal T2 weighted sequences and the axial T2 weighted sequence there is a suspect filling defect at the level of the ampulla corresponding to the stone noted on 01/10/2022 CT of the abdomen pelvis. Review of the more remote CT from 01/19/2022 also shows a suspected stone within the distal CBD. 3. Hepatic steatosis. 4. Known left adrenal gland metastasis appears unchanged from 01/10/2022 CT of the abdomen/pelvis. Electronically Signed   By: Kerby Moors M.D.   On: 01/13/2022 11:49   MR 3D Recon At Scanner  Result Date: 01/13/2022 CLINICAL DATA:  Right upper quadrant pain.  History of lung cancer. EXAM: MRI ABDOMEN WITHOUT CONTRAST  (INCLUDING MRCP) TECHNIQUE: Multiplanar multisequence MR imaging of the abdomen was performed. Heavily T2-weighted images of the biliary and pancreatic ducts were obtained, and three-dimensional MRCP images were rendered by post processing. COMPARISON:  CT AP 01/10/2022 and gallbladder sonogram 01/10/2022 FINDINGS: Lower chest: No acute abnormality. Hepatobiliary: Hepatic steatosis. No focal liver lesion identified. There are stones noted within the dependent portion of the gallbladder neck which measure up to 5 mm. Gallbladder wall edema measures 6 mm in thickness, image 22/5. No pericholecystic fluid. The common bile duct measures 7 mm in maximum diameter. Motion artifact diminishes detail within the common bile duct and limits assessment for underlying choledocholithiasis. The MRCP images are essentially nondiagnostic. On  the coronal  T2 weighted sequences (image 16/4) and the axial T2 weighted sequence (image 24/3) there is a suspect 7 filling defect at the level of the ampulla corresponding to the stone noted on 01/10/2022 CT of the abdomen pelvis. Review of the more remote CT from 01/19/2022 also shows a suspected stone within the distal CBD. Pancreas: Fatty infiltration of the pancreas. No signs of pancreatic inflammation, main duct dilatation or mass. Spleen:  Within normal limits in size and appearance. 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 Normal right adrenal gland. Bilateral simple appearing kidney cysts. No follow-up imaging recommended. No signs of hydronephrosis. Stomach/Bowel: Visualized portions within the abdomen are unremarkable. Vascular/Lymphatic:  No aneurysm.  No signs of abdominal adenopathy. Other: No significant free fluid or fluid collections. There is edema identified along the right lateral abdominal wall and left posterolateral abdominal flank. Musculoskeletal: No suspicious bone lesions identified. IMPRESSION: 1. Gallstones and mild gallbladder wall edema. No pericholecystic fluid. 2. Motion artifact diminishes detail within the common bile duct and limits assessment for underlying choledocholithiasis. On the coronal T2 weighted sequences and the axial T2 weighted sequence there is a suspect filling defect at the level of the ampulla corresponding to the stone noted on 01/10/2022 CT of the abdomen pelvis. Review of the more remote CT from 01/19/2022 also shows a suspected stone within the distal CBD. 3. Hepatic steatosis. 4. Known left adrenal gland metastasis appears unchanged from 01/10/2022 CT of the abdomen/pelvis. Electronically Signed   By: Kerby Moors M.D.   On: 01/13/2022 11:49    Anti-infectives: Anti-infectives (From admission, onward)    Start     Dose/Rate Route Frequency Ordered Stop   01/13/22  1400  Ampicillin-Sulbactam (UNASYN) 3 g in sodium chloride 0.9 % 100 mL IVPB        3 g 200 mL/hr over 30 Minutes Intravenous Every 6 hours 01/13/22 1018     01/12/22 1500  piperacillin-tazobactam (ZOSYN) IVPB 3.375 g  Status:  Discontinued        3.375 g 12.5 mL/hr over 240 Minutes Intravenous Every 8 hours 01/12/22 1337 01/13/22 1018   01/11/22 1400  vancomycin (VANCOREADY) IVPB 1500 mg/300 mL  Status:  Discontinued        1,500 mg 150 mL/hr over 120 Minutes Intravenous Every 24 hours 01/10/22 1611 01/11/22 0533   01/11/22 1300  cefTRIAXone (ROCEPHIN) 2 g in sodium chloride 0.9 % 100 mL IVPB  Status:  Discontinued        2 g 200 mL/hr over 30 Minutes Intravenous Every 24 hours 01/11/22 0533 01/12/22 1337   01/11/22 1300  vancomycin (VANCOREADY) IVPB 1250 mg/250 mL  Status:  Discontinued        1,250 mg 166.7 mL/hr over 90 Minutes Intravenous Every 24 hours 01/11/22 0922 01/12/22 1337   01/10/22 2200  ceFEPIme (MAXIPIME) 2 g in sodium chloride 0.9 % 100 mL IVPB  Status:  Discontinued        2 g 200 mL/hr over 30 Minutes Intravenous Every 8 hours 01/10/22 1521 01/11/22 0533   01/10/22 2200  metroNIDAZOLE (FLAGYL) IVPB 500 mg  Status:  Discontinued        500 mg 100 mL/hr over 60 Minutes Intravenous Every 12 hours 01/10/22 1521 01/11/22 0824   01/10/22 1230  ceFEPIme (MAXIPIME) 2 g in sodium chloride 0.9 % 100 mL IVPB        2 g 200 mL/hr over 30 Minutes Intravenous  Once 01/10/22 1221 01/10/22 1324   01/10/22 1230  metroNIDAZOLE (FLAGYL) IVPB 500 mg        500 mg 100 mL/hr over 60 Minutes Intravenous  Once 01/10/22 1221 01/10/22 1325   01/10/22 1230  vancomycin (VANCOCIN) IVPB 1000 mg/200 mL premix  Status:  Discontinued        1,000 mg 200 mL/hr over 60 Minutes Intravenous  Once 01/10/22 1221 01/10/22 1228   01/10/22 1230  vancomycin (VANCOREADY) IVPB 2000 mg/400 mL        2,000 mg 200 mL/hr over 120 Minutes Intravenous  Once 01/10/22 1228 01/10/22 1537        Assessment/Plan:  Cholelithiasis, choledocholithiasis, possible cholangitis   - CT 11/6 with choledocholithiasis - RUQ [place] 11/6 without cholecystitis or definitive choledocholithiasis -S/P ERCP 11/9: Choledocholithiasis found, extension sphincterotomy and common bile duct stone extraction, filling of the biliary tree and cystic duct but not the gallbladder, erosive duodenitis -LFTs continue to down trend  Continuing current care Potential for lap chole next week if medically cleared.  Again, he would be higher risk for complications given medical condition, obesity, prior laparotomy  Surgery will see again Monday  Complex medical decision making  Coralie Keens 01/15/2022

## 2022-01-16 ENCOUNTER — Encounter (HOSPITAL_COMMUNITY): Payer: Self-pay | Admitting: Internal Medicine

## 2022-01-16 DIAGNOSIS — K8050 Calculus of bile duct without cholangitis or cholecystitis without obstruction: Secondary | ICD-10-CM | POA: Diagnosis not present

## 2022-01-16 DIAGNOSIS — R7881 Bacteremia: Secondary | ICD-10-CM | POA: Diagnosis not present

## 2022-01-16 DIAGNOSIS — R748 Abnormal levels of other serum enzymes: Secondary | ICD-10-CM | POA: Diagnosis not present

## 2022-01-16 DIAGNOSIS — R1010 Upper abdominal pain, unspecified: Secondary | ICD-10-CM | POA: Diagnosis not present

## 2022-01-16 LAB — CBC WITH DIFFERENTIAL/PLATELET
Abs Immature Granulocytes: 0.14 10*3/uL — ABNORMAL HIGH (ref 0.00–0.07)
Basophils Absolute: 0 10*3/uL (ref 0.0–0.1)
Basophils Relative: 0 %
Eosinophils Absolute: 0 10*3/uL (ref 0.0–0.5)
Eosinophils Relative: 0 %
HCT: 35.6 % — ABNORMAL LOW (ref 39.0–52.0)
Hemoglobin: 11.3 g/dL — ABNORMAL LOW (ref 13.0–17.0)
Immature Granulocytes: 2 %
Lymphocytes Relative: 28 %
Lymphs Abs: 1.7 10*3/uL (ref 0.7–4.0)
MCH: 31.2 pg (ref 26.0–34.0)
MCHC: 31.7 g/dL (ref 30.0–36.0)
MCV: 98.3 fL (ref 80.0–100.0)
Monocytes Absolute: 0.6 10*3/uL (ref 0.1–1.0)
Monocytes Relative: 9 %
Neutro Abs: 3.7 10*3/uL (ref 1.7–7.7)
Neutrophils Relative %: 61 %
Platelets: 173 10*3/uL (ref 150–400)
RBC: 3.62 MIL/uL — ABNORMAL LOW (ref 4.22–5.81)
RDW: 14.3 % (ref 11.5–15.5)
WBC: 6.1 10*3/uL (ref 4.0–10.5)
nRBC: 0 % (ref 0.0–0.2)

## 2022-01-16 LAB — COMPREHENSIVE METABOLIC PANEL
ALT: 157 U/L — ABNORMAL HIGH (ref 0–44)
AST: 110 U/L — ABNORMAL HIGH (ref 15–41)
Albumin: 2.6 g/dL — ABNORMAL LOW (ref 3.5–5.0)
Alkaline Phosphatase: 74 U/L (ref 38–126)
Anion gap: 5 (ref 5–15)
BUN: 23 mg/dL (ref 8–23)
CO2: 30 mmol/L (ref 22–32)
Calcium: 8.4 mg/dL — ABNORMAL LOW (ref 8.9–10.3)
Chloride: 109 mmol/L (ref 98–111)
Creatinine, Ser: 1.03 mg/dL (ref 0.61–1.24)
GFR, Estimated: >60 mL/min (ref >60)
Glucose, Bld: 105 mg/dL — ABNORMAL HIGH (ref 70–99)
Potassium: 4.4 mmol/L (ref 3.5–5.1)
Sodium: 144 mmol/L (ref 135–145)
Total Bilirubin: 1.1 mg/dL (ref 0.3–1.2)
Total Protein: 5.7 g/dL — ABNORMAL LOW (ref 6.5–8.1)

## 2022-01-16 MED ORDER — POLYETHYLENE GLYCOL 3350 17 G PO PACK
17.0000 g | PACK | Freq: Two times a day (BID) | ORAL | Status: DC
  Administered 2022-01-21: 17 g via ORAL
  Filled 2022-01-16 (×7): qty 1

## 2022-01-16 NOTE — Progress Notes (Addendum)
PROGRESS NOTE    [name] [name]  YCX:[id number] DOB: Jan 24, 1956 DOA: 01/10/2022 PCP: [name], MD   Brief Narrative: [name] [name] is a 66 y.o. male with a history of depression, anxiety, chronic pain, osteoarthritis, COPD, hyperlipidemia, fibromyalgia, GERD, migraine, hypertension, obesity. Patient presented secondary to abdominal pain, nausea and vomiting with evidence of a CBD stone. Patient met sepsis criteria on admission and was found to have an E. Coli and Enterococcus casseliflavus bacteremia. Antibiotics tailored to cover bacteremia. GI plan performed ERCP on 11/9 with sphincterotomy and CBD stone extraction. Plan for cholecystectomy.   Assessment and Plan:  Choledocholithiasis Elevated AST/ALT with CT imaging significant for suspicion of a 4 mm stone at the ampulla. GI and general surgery consulted. ERCP with sphincterotomy and common bile duct stone extraction performed on 11/9. AST/ALT/bilirubin trended down but now starting to rise. Persistent abdominal pain.  -GI recommendations: signed off -General surgery recommendations: Planning laparoscopic cholecystectomy -Daily CMP  Sepsis Present on admission. Secondary to E. Coli and enterococcus casseliflavus bacteremia likely related to above source. Antibiotics as mentioned below.  E. Coli bacteremia Enterococcus casseliflavus bacteremia Patient empirically managed on Vancomycin, Flagyl and Cefepime. He was transitioned to Ceftriaxone and Vancomycin for E. Coli and GPCs noted on culture data and now transitioned to Zosyn with final identification of bacteria. ID consulted. Port-a-Cath removed on 11/10 by IR -Unasyn IV per ID -Transesophageal Echocardiogram pending  Acute metabolic encephalopathy Presumed secondary to sepsis. Resolved.  Chronic pain Patient's home regimen held on admission and patient started on a dilaudid PCA pump. -Continue MS Contin -Continue dilaudid PRN  Stage IV non-small cell right  lung cancer Noted. Patient under observation. Per oncologist, patient is not enrolled in hospice and expects at least 1-2 years life expectancy for patient. Oncology has no barriers to surgery if needed. Port removed on 11/10.  CAD History of PCI of RCA in 2018. Currently on Brilinta which is held secondary to plan for ERCP.  Chronic diastolic heart failure Noted. Currently without acute heart failure. Home Lasix held. -Daily weights and strict in/out  Primary hypertension Patient appears to be managed on diuretic monotherapy. Lasix held secondary to sepsis.  Hyperlipidemia Patient is on Crestor as an outpatient which was held on admission. Elevated AST/ALT secondary to choledocholithiasis -Continue to hold Crestor for now  COPD -Continue Xopenex PRN  Thrombocytopenia Trending up. Likely secondary to sepsis.  DVT prophylaxis: Lovenox Code Status:   Code Status: Full Code Family Communication: None at bedside. Sister on telephone Disposition Plan: Discharge home likely in several days pending transition to outpatient antibiotic regimen, specialist recommendations/management   Consultants:  General surgery Gastroenterology Infectious disease  Procedures:  11/9: ERCP 11/10: Chest port removal  Antimicrobials: Vancomycin Cefepime Flagyl Ceftriaxone Zosyn   Subjective: Continued abdominal pain. No other concerns.  Objective: BP 138/81 (BP Location: Left Arm)   Pulse 64   Temp 97.7 F (36.5 C) (Oral)   Resp 18   Ht _0  (1.753 m)   Wt 107.3 kg   SpO2 97%   BMI 34.93 kg/m   Examination:  General exam: Appears calm and comfortable Respiratory system: Clear to auscultation. Respiratory effort normal. Cardiovascular system: S1 & S2 heard, RRR. No murmurs, rubs, gallops or clicks. Gastrointestinal system: Abdomen is distended, soft and non-tender. Normal bowel sounds heard. Central nervous system: Alert and oriented. No focal neurological  deficits. Musculoskeletal: 2+ BLE pitting edema. No calf tenderness Skin: No cyanosis. No rashes Psychiatry: Judgement and insight appear normal. Mood &  affect appropriate.    Data Reviewed: I have personally reviewed following labs and imaging studies  CBC Lab Results  Component Value Date   WBC 6.1 01/16/2022   RBC 3.62 (L) 01/16/2022   HGB 11.3 (L) 01/16/2022   HCT 35.6 (L) 01/16/2022   MCV 98.3 01/16/2022   MCH 31.2 01/16/2022   PLT 173 01/16/2022   MCHC 31.7 01/16/2022   RDW 14.3 01/16/2022   LYMPHSABS 1.7 01/16/2022   MONOABS 0.6 01/16/2022   EOSABS 0.0 01/16/2022   BASOSABS 0.0 59/16/3846     Last metabolic panel Lab Results  Component Value Date   NA 144 01/16/2022   K 4.4 01/16/2022   CL 109 01/16/2022   CO2 30 01/16/2022   BUN 23 01/16/2022   CREATININE 1.03 01/16/2022   GLUCOSE 105 (H) 01/16/2022   GFRNONAA >60 01/16/2022   GFRAA >60 01/16/2017   CALCIUM 8.4 (L) 01/16/2022   PHOS 3.8 02/08/2021   PROT 5.7 (L) 01/16/2022   ALBUMIN 2.6 (L) 01/16/2022   BILITOT 1.1 01/16/2022   ALKPHOS 74 01/16/2022   AST 110 (H) 01/16/2022   ALT 157 (H) 01/16/2022   ANIONGAP 5 01/16/2022    GFR: Estimated Creatinine Clearance: 85.1 mL/min (by C-G formula based on SCr of 1.03 mg/dL).  Recent Results (from the past 240 hour(s))  Blood Culture (routine x 2)     Status: Abnormal (Preliminary result)   Collection Time: 01/10/22 12:18 PM   Specimen: BLOOD  Result Value Ref Range Status   Specimen Description   Final    BLOOD LEFT ANTECUBITAL Performed at [facility] [address], [place], [place] [address]    Special Requests   Final    BOTTLES DRAWN AEROBIC AND ANAEROBIC Blood Culture adequate volume Performed at [facility] [address] [place], [place] [address]    Culture  Setup Time   Final    GRAM POSITIVE COCCI IN PAIRS IN BOTH AEROBIC AND ANAEROBIC BOTTLES CRITICAL VALUE NOTED.  VALUE IS CONSISTENT WITH  PREVIOUSLY REPORTED AND CALLED VALUE.    Culture (A)  Final    ENTEROCOCCUS CASSELIFLAVUS Sent to [facility] for CONFIRMATION OF identification and speciation. Sent to [facility] for further susceptibility testing. Performed at [facility], [place] [address], [place], [place] [address]    Report Status PENDING  Incomplete  Blood Culture (routine x 2)     Status: Abnormal   Collection Time: 01/10/22 12:36 PM   Specimen: BLOOD  Result Value Ref Range Status   Specimen Description   Final    BLOOD PORTA CATH Performed at [facility] [address], [place], [place] [address]    Special Requests   Final    BOTTLES DRAWN AEROBIC AND ANAEROBIC Blood Culture adequate volume Performed at [facility] [address] [place], [place] [address]    Culture  Setup Time   Final    GRAM POSITIVE COCCI IN PAIRS GRAM NEGATIVE RODS IN BOTH AEROBIC AND ANAEROBIC BOTTLES CRITICAL RESULT CALLED TO, READ BACK BY AND VERIFIED WITH: PHARMD D. POINDEXTER 01/11/22 _0  BY AB    Culture (A)  Final    ESCHERICHIA COLI CORRECTED RESULTS ENTEROCOCCUS CASSELIFLAVUS PREVIOUSLY REPORTED AS: ENTEROCOCCUS FAECALIS CORRECTED RESULTS CALLED TO: PHARMD TERRY G. [phone] FCP SUSCEPTIBILITIES PERFORMED ON PREVIOUS CULTURE WITHIN THE LAST 5 DAYS. Performed at [facility], [place] [address], [place], [place] [address]    Report Status 01/14/2022 FINAL  Final  Organism ID, Bacteria ESCHERICHIA COLI  Final      Susceptibility   Escherichia coli - MIC*    AMPICILLIN 8 SENSITIVE Sensitive     CEFAZOLIN <=4 SENSITIVE Sensitive     CEFEPIME <=0.12 SENSITIVE Sensitive     CEFTAZIDIME <=1 SENSITIVE Sensitive     CEFTRIAXONE <=0.25 SENSITIVE Sensitive     CIPROFLOXACIN <=0.25 SENSITIVE Sensitive     GENTAMICIN <=1 SENSITIVE Sensitive     IMIPENEM <=0.25 SENSITIVE Sensitive     TRIMETH/SULFA <=20 SENSITIVE Sensitive     AMPICILLIN/SULBACTAM 4 SENSITIVE  Sensitive     PIP/TAZO <=4 SENSITIVE Sensitive     * ESCHERICHIA COLI  Blood Culture ID Panel (Reflexed)     Status: Abnormal   Collection Time: 01/10/22 12:36 PM  Result Value Ref Range Status   Enterococcus faecalis NOT DETECTED NOT DETECTED Final   Enterococcus Faecium NOT DETECTED NOT DETECTED Final   Listeria monocytogenes NOT DETECTED NOT DETECTED Final   Staphylococcus species NOT DETECTED NOT DETECTED Final   Staphylococcus aureus (BCID) NOT DETECTED NOT DETECTED Final   Staphylococcus epidermidis NOT DETECTED NOT DETECTED Final   Staphylococcus lugdunensis NOT DETECTED NOT DETECTED Final   Streptococcus species NOT DETECTED NOT DETECTED Final   Streptococcus agalactiae NOT DETECTED NOT DETECTED Final   Streptococcus pneumoniae NOT DETECTED NOT DETECTED Final   Streptococcus pyogenes NOT DETECTED NOT DETECTED Final   A.calcoaceticus-baumannii NOT DETECTED NOT DETECTED Final   Bacteroides fragilis NOT DETECTED NOT DETECTED Final   Enterobacterales DETECTED (A) NOT DETECTED Final    Comment: Enterobacterales represent a large order of gram negative bacteria, not a single organism. CRITICAL RESULT CALLED TO, READ BACK BY AND VERIFIED WITH: PHARMD D. POINDEXTER 01/11/22 _0  BY AB    Enterobacter cloacae complex NOT DETECTED NOT DETECTED Final   Escherichia coli DETECTED (A) NOT DETECTED Final    Comment: CRITICAL RESULT CALLED TO, READ BACK BY AND VERIFIED WITH: PHARMD D. POINDEXTER 01/11/22 _1  BY AB    Klebsiella aerogenes NOT DETECTED NOT DETECTED Final   Klebsiella oxytoca NOT DETECTED NOT DETECTED Final   Klebsiella pneumoniae NOT DETECTED NOT DETECTED Final   Proteus species NOT DETECTED NOT DETECTED Final   Salmonella species NOT DETECTED NOT DETECTED Final   Serratia marcescens NOT DETECTED NOT DETECTED Final   Haemophilus influenzae NOT DETECTED NOT DETECTED Final   Neisseria meningitidis NOT DETECTED NOT DETECTED Final   Pseudomonas aeruginosa NOT DETECTED NOT  DETECTED Final   Stenotrophomonas maltophilia NOT DETECTED NOT DETECTED Final   Candida albicans NOT DETECTED NOT DETECTED Final   Candida auris NOT DETECTED NOT DETECTED Final   Candida glabrata NOT DETECTED NOT DETECTED Final   Candida krusei NOT DETECTED NOT DETECTED Final   Candida parapsilosis NOT DETECTED NOT DETECTED Final   Candida tropicalis NOT DETECTED NOT DETECTED Final   Cryptococcus neoformans/gattii NOT DETECTED NOT DETECTED Final   CTX-M ESBL NOT DETECTED NOT DETECTED Final   Carbapenem resistance IMP NOT DETECTED NOT DETECTED Final   Carbapenem resistance KPC NOT DETECTED NOT DETECTED Final   Carbapenem resistance NDM NOT DETECTED NOT DETECTED Final   Carbapenem resist OXA 48 LIKE NOT DETECTED NOT DETECTED Final   Carbapenem resistance VIM NOT DETECTED NOT DETECTED Final    Comment: Performed at [facility], [address], [place], [place] [address]  Resp Panel by RT-PCR (Flu A&B, Covid)     Status: None   Collection Time: 01/10/22  1:40 PM   Specimen: Nasal Swab  Result Value Ref Range Status   SARS Coronavirus 2 by RT PCR NEGATIVE NEGATIVE Final    Comment: (NOTE) SARS-CoV-2 target nucleic acids are NOT DETECTED.  The SARS-CoV-2 RNA is generally detectable in upper respiratory specimens during the acute phase of infection. The lowest concentration of SARS-CoV-2 viral copies this assay can detect is 138 copies/mL. A negative result does not preclude SARS-Cov-2 infection and should not be used as the sole basis for treatment or other patient management decisions. A negative result may occur with  improper specimen collection/handling, submission of specimen other than nasopharyngeal swab, presence of viral mutation(s) within the areas targeted by this assay, and inadequate number of viral copies(<138 copies/mL). A negative result must be combined with clinical observations, patient history, and epidemiological information. The expected result is  Negative.  Fact Sheet for Patients:  EntrepreneurPulse.com.au  Fact Sheet for Healthcare Providers:  IncredibleEmployment.be  This test is no t yet approved or cleared by the [place] FDA and  has been authorized for detection and/or diagnosis of SARS-CoV-2 by FDA under an Emergency Use Authorization (EUA). This EUA will remain  in effect (meaning this test can be used) for the duration of the COVID-19 declaration under Section 564(b)(1) of the Act, 21 U.S.C.section 360bbb-3(b)(1), unless the authorization is terminated  or revoked sooner.       Influenza A by PCR NEGATIVE NEGATIVE Final   Influenza B by PCR NEGATIVE NEGATIVE Final    Comment: (NOTE) The Xpert Xpress SARS-CoV-2/FLU/RSV plus assay is intended as an aid in the diagnosis of influenza from Nasopharyngeal swab specimens and should not be used as a sole basis for treatment. Nasal washings and aspirates are unacceptable for Xpert Xpress SARS-CoV-2/FLU/RSV testing.  Fact Sheet for Patients: EntrepreneurPulse.com.au  Fact Sheet for Healthcare Providers: IncredibleEmployment.be  This test is not yet approved or cleared by the [place] FDA and has been authorized for detection and/or diagnosis of SARS-CoV-2 by FDA under an Emergency Use Authorization (EUA). This EUA will remain in effect (meaning this test can be used) for the duration of the COVID-19 declaration under Section 564(b)(1) of the Act, 21 U.S.C. section 360bbb-3(b)(1), unless the authorization is terminated or revoked.  Performed at [facility], [place] [address] [place], [place] [address]   Urine Culture     Status: Abnormal   Collection Time: 01/10/22  1:40 PM   Specimen: In/Out Cath Urine  Result Value Ref Range Status   Specimen Description   Final    IN/OUT CATH URINE Performed at [facility] [address] [place],  [place] [address]    Special Requests   Final    NONE Performed at [facility], [place] [address] [place], [place] [address]    Culture 3,000 COLONIES/mL ENTEROCOCCUS FAECALIS (A)  Final   Report Status 01/13/2022 FINAL  Final   Organism ID, Bacteria ENTEROCOCCUS FAECALIS (A)  Final      Susceptibility   Enterococcus faecalis - MIC*    AMPICILLIN <=2 SENSITIVE Sensitive     NITROFURANTOIN <=16 SENSITIVE Sensitive     VANCOMYCIN 1 SENSITIVE Sensitive     * 3,000 COLONIES/mL ENTEROCOCCUS FAECALIS  MRSA Next Gen by PCR, Nasal     Status: None   Collection Time: 01/10/22  6:03 PM   Specimen: Nasal Mucosa; Nasal Swab  Result Value Ref Range Status   MRSA by PCR Next Gen NOT DETECTED NOT DETECTED Final    Comment: (NOTE) The GeneXpert MRSA Assay (FDA  approved for NASAL specimens only), is one component of a comprehensive MRSA colonization surveillance program. It is not intended to diagnose MRSA infection nor to guide or monitor treatment for MRSA infections. Test performance is not FDA approved in patients less than 60 years old. Performed at [facility], [place] [address] [place], [place] [address]   Culture, blood (Routine X 2) w Reflex to ID Panel     Status: None (Preliminary result)   Collection Time: 01/13/22  6:37 AM   Specimen: BLOOD RIGHT ARM  Result Value Ref Range Status   Specimen Description   Final    BLOOD RIGHT ARM Performed at [facility] [address], [place], [place] [address]    Special Requests   Final    BOTTLES DRAWN AEROBIC AND ANAEROBIC Blood Culture adequate volume Performed at [facility] [address] [place], [place] [address]    Culture   Final    NO GROWTH 3 DAYS Performed at [facility], [place] [address], [place], [place] [address]    Report Status PENDING  Incomplete  Culture, blood (Routine X 2) w Reflex to ID Panel     Status: None (Preliminary result)    Collection Time: 01/13/22  6:46 AM   Specimen: BLOOD  Result Value Ref Range Status   Specimen Description   Final    BLOOD BLOOD LEFT HAND Performed at [facility] [address], [place], [place] [address]    Special Requests   Final    BOTTLES DRAWN AEROBIC AND ANAEROBIC Blood Culture results may not be optimal due to an inadequate volume of blood received in culture bottles Performed at [facility] [address], [place], [place] [address]    Culture   Final    NO GROWTH 3 DAYS Performed at [facility], [place] [address] [place],  [address]    Report Status PENDING  Incomplete      Radiology Studies: IR REMOVAL TUN ACCESS W/ PORT W/O FL MOD SED  Result Date: 01/14/2022 INDICATION: Bacteremia. Chest port placed by Dr. Vernard Gambles on 02/02/2021. It has been utilized without issues. EXAM: REMOVAL RIGHT IJ VEIN PORT-A-CATH MEDICATIONS: Patient is on inpatient course of antibiotics. ANESTHESIA/SEDATION: None COMPLICATIONS: None immediate. PROCEDURE: Informed written consent was obtained from the patient after a thorough discussion of the procedural risks, benefits and alternatives. All questions were addressed. Maximal Sterile Barrier Technique was utilized including caps, mask, sterile gowns, sterile gloves, sterile drape, hand hygiene and skin antiseptic. A timeout was performed prior to the initiation of the procedure. Right anterior upper chest prepped and draped in the usual sterile fashion. Following local lidocaine administration, an incision was made at the superior margin of the port pocket. The pocket was dissected, and the port and catheter were removed in their entirety. The incision was closed with deep and superficial absorbable suture and sealed with Dermabond. IMPRESSION: Successful right IJ vein Port-A-Cath explant. Electronically Signed   By: Miachel Roux M.D.   On: 01/14/2022 17:12      LOS: 6 days    Cordelia Poche,  MD [facility] 01/16/2022, 10:39 AM   If 7PM-7AM, please contact night-coverage [email or web address]

## 2022-01-17 ENCOUNTER — Encounter (HOSPITAL_COMMUNITY): Payer: Self-pay | Admitting: Family Medicine

## 2022-01-17 ENCOUNTER — Inpatient Hospital Stay (HOSPITAL_COMMUNITY): Payer: Medicare Other

## 2022-01-17 ENCOUNTER — Encounter (HOSPITAL_COMMUNITY)
Admission: EM | Disposition: A | Discharge status: Home Health | Payer: Self-pay | Source: Home / Self Care | Attending: Family Medicine

## 2022-01-17 ENCOUNTER — Inpatient Hospital Stay (HOSPITAL_COMMUNITY): Payer: Medicare Other | Admitting: Certified Registered"

## 2022-01-17 DIAGNOSIS — I252 Old myocardial infarction: Secondary | ICD-10-CM

## 2022-01-17 DIAGNOSIS — R7881 Bacteremia: Secondary | ICD-10-CM

## 2022-01-17 DIAGNOSIS — R1010 Upper abdominal pain, unspecified: Secondary | ICD-10-CM | POA: Diagnosis not present

## 2022-01-17 DIAGNOSIS — I10 Essential (primary) hypertension: Secondary | ICD-10-CM

## 2022-01-17 DIAGNOSIS — I110 Hypertensive heart disease with heart failure: Secondary | ICD-10-CM | POA: Diagnosis not present

## 2022-01-17 DIAGNOSIS — Q2112 Patent foramen ovale: Secondary | ICD-10-CM

## 2022-01-17 DIAGNOSIS — K8050 Calculus of bile duct without cholangitis or cholecystitis without obstruction: Secondary | ICD-10-CM | POA: Diagnosis not present

## 2022-01-17 DIAGNOSIS — Z87891 Personal history of nicotine dependence: Secondary | ICD-10-CM

## 2022-01-17 DIAGNOSIS — R748 Abnormal levels of other serum enzymes: Secondary | ICD-10-CM | POA: Diagnosis not present

## 2022-01-17 DIAGNOSIS — I2510 Atherosclerotic heart disease of native coronary artery without angina pectoris: Secondary | ICD-10-CM | POA: Diagnosis not present

## 2022-01-17 HISTORY — PX: TEE WITHOUT CARDIOVERSION: SHX5443

## 2022-01-17 HISTORY — PX: BUBBLE STUDY: SHX6837

## 2022-01-17 LAB — COMPREHENSIVE METABOLIC PANEL
ALT: 142 U/L — ABNORMAL HIGH (ref 0–44)
AST: 77 U/L — ABNORMAL HIGH (ref 15–41)
Albumin: 2.8 g/dL — ABNORMAL LOW (ref 3.5–5.0)
Alkaline Phosphatase: 74 U/L (ref 38–126)
Anion gap: 8 (ref 5–15)
BUN: 16 mg/dL (ref 8–23)
CO2: 27 mmol/L (ref 22–32)
Calcium: 8.2 mg/dL — ABNORMAL LOW (ref 8.9–10.3)
Chloride: 106 mmol/L (ref 98–111)
Creatinine, Ser: 1.01 mg/dL (ref 0.61–1.24)
GFR, Estimated: >60 mL/min (ref >60)
Glucose, Bld: 113 mg/dL — ABNORMAL HIGH (ref 70–99)
Potassium: 3.1 mmol/L — ABNORMAL LOW (ref 3.5–5.1)
Sodium: 141 mmol/L (ref 135–145)
Total Bilirubin: 1 mg/dL (ref 0.3–1.2)
Total Protein: 5.8 g/dL — ABNORMAL LOW (ref 6.5–8.1)

## 2022-01-17 LAB — CBC WITH DIFFERENTIAL/PLATELET
Abs Immature Granulocytes: 0.13 10*3/uL — ABNORMAL HIGH (ref 0.00–0.07)
Basophils Absolute: 0 10*3/uL (ref 0.0–0.1)
Basophils Relative: 0 %
Eosinophils Absolute: 0.2 10*3/uL (ref 0.0–0.5)
Eosinophils Relative: 3 %
HCT: 36.1 % — ABNORMAL LOW (ref 39.0–52.0)
Hemoglobin: 11.5 g/dL — ABNORMAL LOW (ref 13.0–17.0)
Immature Granulocytes: 2 %
Lymphocytes Relative: 31 %
Lymphs Abs: 1.7 10*3/uL (ref 0.7–4.0)
MCH: 31.5 pg (ref 26.0–34.0)
MCHC: 31.9 g/dL (ref 30.0–36.0)
MCV: 98.9 fL (ref 80.0–100.0)
Monocytes Absolute: 0.6 10*3/uL (ref 0.1–1.0)
Monocytes Relative: 10 %
Neutro Abs: 2.8 10*3/uL (ref 1.7–7.7)
Neutrophils Relative %: 54 %
Platelets: 200 10*3/uL (ref 150–400)
RBC: 3.65 MIL/uL — ABNORMAL LOW (ref 4.22–5.81)
RDW: 14.5 % (ref 11.5–15.5)
WBC: 5.4 10*3/uL (ref 4.0–10.5)
nRBC: 0 % (ref 0.0–0.2)

## 2022-01-17 SURGERY — ECHOCARDIOGRAM, TRANSESOPHAGEAL
Anesthesia: Monitor Anesthesia Care

## 2022-01-17 MED ORDER — CLONAZEPAM 1 MG PO TABS
1.0000 mg | ORAL_TABLET | Freq: Every evening | ORAL | Status: DC | PRN
  Administered 2022-01-17: 1 mg via ORAL
  Filled 2022-01-17: qty 1

## 2022-01-17 MED ORDER — SODIUM CHLORIDE 0.9 % IV SOLN: INTRAVENOUS | Status: DC | PRN

## 2022-01-17 MED ORDER — PROPOFOL 10 MG/ML IV BOLUS
INTRAVENOUS | Status: DC | PRN
  Administered 2022-01-17: 50 mg via INTRAVENOUS

## 2022-01-17 MED ORDER — LIDOCAINE HCL (CARDIAC) PF 100 MG/5ML IV SOSY
PREFILLED_SYRINGE | INTRAVENOUS | Status: DC | PRN
  Administered 2022-01-17: 80 mg via INTRAVENOUS
  Administered 2022-01-17: 20 mg via INTRAVENOUS

## 2022-01-17 MED ORDER — PROPOFOL 500 MG/50ML IV EMUL
INTRAVENOUS | Status: DC | PRN
  Administered 2022-01-17: 175 ug/kg/min via INTRAVENOUS

## 2022-01-17 MED ORDER — HYDROXYZINE HCL 10 MG PO TABS
10.0000 mg | ORAL_TABLET | Freq: Once | ORAL | Status: AC | PRN
  Administered 2022-01-17: 10 mg via ORAL
  Filled 2022-01-17: qty 1

## 2022-01-17 NOTE — Progress Notes (Addendum)
PROGRESS NOTE    [name] [name]  ERD:[id number] DOB: 11-08-1955 DOA: 01/10/2022 PCP: [name], MD   Brief Narrative: [name] [name] is a 66 y.o. male with a history of depression, anxiety, chronic pain, osteoarthritis, COPD, hyperlipidemia, fibromyalgia, GERD, migraine, hypertension, obesity. Patient presented secondary to abdominal pain, nausea and vomiting with evidence of a CBD stone. Patient met sepsis criteria on admission and was found to have an E. Coli and Enterococcus casseliflavus bacteremia. Antibiotics tailored to cover bacteremia. GI plan performed ERCP on 11/9 with sphincterotomy and CBD stone extraction. Plan for cholecystectomy.   Assessment and Plan:  Choledocholithiasis Elevated AST/ALT with CT imaging significant for suspicion of a 4 mm stone at the ampulla. GI and general surgery consulted. ERCP with sphincterotomy and common bile duct stone extraction performed on 11/9. AST/ALT/bilirubin trended down with slight rise; now downtrending again. Persistent abdominal pain -GI recommendations: signed off -General surgery recommendations: Planning laparoscopic cholecystectomy on 11/15 -Daily CMP  Sepsis Present on admission. Secondary to E. Coli and enterococcus casseliflavus bacteremia likely related to above source. Antibiotics as mentioned below.  E. Coli bacteremia Enterococcus casseliflavus bacteremia Patient empirically managed on Vancomycin, Flagyl and Cefepime. He was transitioned to Ceftriaxone and Vancomycin for E. Coli and GPCs noted on culture data and now transitioned to Zosyn with final identification of bacteria. ID consulted. Port-a-Cath removed on 11/10 by IR. Transesophageal Echocardiogram (11/13) without obvious evidence of vegetation -Unasyn IV per ID  Acute metabolic encephalopathy Presumed secondary to sepsis. Resolved.  Chronic pain Patient's home regimen held on admission and patient started on a dilaudid PCA pump. -Continue MS  Contin -Continue dilaudid PRN  Stage IV non-small cell right lung cancer Noted. Patient under observation. Per oncologist, patient is not enrolled in hospice and expects at least 1-2 years life expectancy for patient. Oncology has no barriers to surgery if needed. Port removed on 11/10.  CAD History of PCI of RCA in 2018. Currently on Brilinta which is held secondary to plan for ERCP.  Chronic diastolic heart failure Noted. Currently without acute heart failure. Home Lasix held. -Daily weights and strict in/out  Primary hypertension Patient appears to be managed on diuretic monotherapy. Lasix held secondary to sepsis.  Hyperlipidemia Patient is on Crestor as an outpatient which was held on admission. Elevated AST/ALT secondary to choledocholithiasis -Continue to hold Crestor for now  Anxiety -Discontinue Ativan and resume home clonazepam  COPD -Continue Xopenex PRN  Thrombocytopenia Likely secondary to sepsis. Resolved.  DVT prophylaxis: Lovenox Code Status:   Code Status: Full Code Family Communication: Two sisters at bedside Disposition Plan: Discharge home likely in several days pending transition to outpatient antibiotic regimen, specialist recommendations/management/laparoscopic cholecystectomy   Consultants:  General surgery Gastroenterology Infectious disease  Procedures:  11/9: ERCP 11/10: Chest port removal 11/13: Transesophageal Echocardiogram  Antimicrobials: Vancomycin Cefepime Flagyl Ceftriaxone Zosyn   Subjective: Continues to have abdominal pain and anxiety issues.  Objective: BP 125/75 (BP Location: Left Arm)   Pulse 64   Temp 98.1 F (36.7 C) (Oral)   Resp 20   Ht _0  (1.753 m)   Wt 107.3 kg   SpO2 98%   BMI 34.93 kg/m   Examination:  General exam: Appears calm and comfortable Respiratory system: Clear to auscultation. Respiratory effort normal. Cardiovascular system: S1 & S2 heard, RRR. No murmurs, rubs, gallops or  clicks. Gastrointestinal system: Abdomen is protuberant/distended, soft and nontender. Normal bowel sounds heard. Central nervous system: Alert and oriented. No focal neurological deficits. Musculoskeletal:  No  calf tenderness Skin: No cyanosis. No rashes   Data Reviewed: I have personally reviewed following labs and imaging studies  CBC Lab Results  Component Value Date   WBC 5.4 01/17/2022   RBC 3.65 (L) 01/17/2022   HGB 11.5 (L) 01/17/2022   HCT 36.1 (L) 01/17/2022   MCV 98.9 01/17/2022   MCH 31.5 01/17/2022   PLT 200 01/17/2022   MCHC 31.9 01/17/2022   RDW 14.5 01/17/2022   LYMPHSABS 1.7 01/17/2022   MONOABS 0.6 01/17/2022   EOSABS 0.2 01/17/2022   BASOSABS 0.0 82/50/5397     Last metabolic panel Lab Results  Component Value Date   NA 141 01/17/2022   K 3.1 (L) 01/17/2022   CL 106 01/17/2022   CO2 27 01/17/2022   BUN 16 01/17/2022   CREATININE 1.01 01/17/2022   GLUCOSE 113 (H) 01/17/2022   GFRNONAA >60 01/17/2022   GFRAA >60 01/16/2017   CALCIUM 8.2 (L) 01/17/2022   PHOS 3.8 02/08/2021   PROT 5.8 (L) 01/17/2022   ALBUMIN 2.8 (L) 01/17/2022   BILITOT 1.0 01/17/2022   ALKPHOS 74 01/17/2022   AST 77 (H) 01/17/2022   ALT 142 (H) 01/17/2022   ANIONGAP 8 01/17/2022    GFR: Estimated Creatinine Clearance: 86.8 mL/min (by C-G formula based on SCr of 1.01 mg/dL).  Recent Results (from the past 240 hour(s))  Blood Culture (routine x 2)     Status: Abnormal (Preliminary result)   Collection Time: 01/10/22 12:18 PM   Specimen: BLOOD  Result Value Ref Range Status   Specimen Description   Final    BLOOD LEFT ANTECUBITAL Performed at [facility] [address], [place], [place] [address]    Special Requests   Final    BOTTLES DRAWN AEROBIC AND ANAEROBIC Blood Culture adequate volume Performed at [facility] [address] [place], [place] [address]    Culture  Setup Time   Final    GRAM POSITIVE COCCI IN PAIRS IN  BOTH AEROBIC AND ANAEROBIC BOTTLES CRITICAL VALUE NOTED.  VALUE IS CONSISTENT WITH PREVIOUSLY REPORTED AND CALLED VALUE.    Culture (A)  Final    ENTEROCOCCUS CASSELIFLAVUS Sent to [facility] for CONFIRMATION OF identification and speciation. Sent to [facility] for further susceptibility testing. Performed at [facility], [place] [address], [place], [place] [address]    Report Status PENDING  Incomplete  Blood Culture (routine x 2)     Status: Abnormal   Collection Time: 01/10/22 12:36 PM   Specimen: BLOOD  Result Value Ref Range Status   Specimen Description   Final    BLOOD PORTA CATH Performed at [facility] [address], [place], [place] [address]    Special Requests   Final    BOTTLES DRAWN AEROBIC AND ANAEROBIC Blood Culture adequate volume Performed at [facility] [address], [place], [place] [address]    Culture  Setup Time   Final    GRAM POSITIVE COCCI IN PAIRS GRAM NEGATIVE RODS IN BOTH AEROBIC AND ANAEROBIC BOTTLES CRITICAL RESULT CALLED TO, READ BACK BY AND VERIFIED WITH: PHARMD D. POINDEXTER 01/11/22 _0  BY AB    Culture (A)  Final    ESCHERICHIA COLI CORRECTED RESULTS ENTEROCOCCUS CASSELIFLAVUS PREVIOUSLY REPORTED AS: ENTEROCOCCUS FAECALIS CORRECTED RESULTS CALLED TO: PHARMD TERRY G. [phone] FCP SUSCEPTIBILITIES PERFORMED ON PREVIOUS CULTURE WITHIN THE LAST 5 DAYS. Performed at [facility], [place] [address], [place],  [address]    Report Status  01/14/2022 FINAL  Final   Organism ID, Bacteria ESCHERICHIA COLI  Final      Susceptibility   Escherichia coli - MIC*    AMPICILLIN 8 SENSITIVE Sensitive     CEFAZOLIN <=4 SENSITIVE Sensitive     CEFEPIME <=0.12 SENSITIVE Sensitive     CEFTAZIDIME <=1 SENSITIVE Sensitive     CEFTRIAXONE <=0.25 SENSITIVE Sensitive     CIPROFLOXACIN <=0.25 SENSITIVE Sensitive     GENTAMICIN <=1 SENSITIVE Sensitive     IMIPENEM <=0.25 SENSITIVE Sensitive      TRIMETH/SULFA <=20 SENSITIVE Sensitive     AMPICILLIN/SULBACTAM 4 SENSITIVE Sensitive     PIP/TAZO <=4 SENSITIVE Sensitive     * ESCHERICHIA COLI  Blood Culture ID Panel (Reflexed)     Status: Abnormal   Collection Time: 01/10/22 12:36 PM  Result Value Ref Range Status   Enterococcus faecalis NOT DETECTED NOT DETECTED Final   Enterococcus Faecium NOT DETECTED NOT DETECTED Final   Listeria monocytogenes NOT DETECTED NOT DETECTED Final   Staphylococcus species NOT DETECTED NOT DETECTED Final   Staphylococcus aureus (BCID) NOT DETECTED NOT DETECTED Final   Staphylococcus epidermidis NOT DETECTED NOT DETECTED Final   Staphylococcus lugdunensis NOT DETECTED NOT DETECTED Final   Streptococcus species NOT DETECTED NOT DETECTED Final   Streptococcus agalactiae NOT DETECTED NOT DETECTED Final   Streptococcus pneumoniae NOT DETECTED NOT DETECTED Final   Streptococcus pyogenes NOT DETECTED NOT DETECTED Final   A.calcoaceticus-baumannii NOT DETECTED NOT DETECTED Final   Bacteroides fragilis NOT DETECTED NOT DETECTED Final   Enterobacterales DETECTED (A) NOT DETECTED Final    Comment: Enterobacterales represent a large order of gram negative bacteria, not a single organism. CRITICAL RESULT CALLED TO, READ BACK BY AND VERIFIED WITH: PHARMD D. POINDEXTER 01/11/22 _0  BY AB    Enterobacter cloacae complex NOT DETECTED NOT DETECTED Final   Escherichia coli DETECTED (A) NOT DETECTED Final    Comment: CRITICAL RESULT CALLED TO, READ BACK BY AND VERIFIED WITH: PHARMD D. POINDEXTER 01/11/22 _1  BY AB    Klebsiella aerogenes NOT DETECTED NOT DETECTED Final   Klebsiella oxytoca NOT DETECTED NOT DETECTED Final   Klebsiella pneumoniae NOT DETECTED NOT DETECTED Final   Proteus species NOT DETECTED NOT DETECTED Final   Salmonella species NOT DETECTED NOT DETECTED Final   Serratia marcescens NOT DETECTED NOT DETECTED Final   Haemophilus influenzae NOT DETECTED NOT DETECTED Final   Neisseria meningitidis  NOT DETECTED NOT DETECTED Final   Pseudomonas aeruginosa NOT DETECTED NOT DETECTED Final   Stenotrophomonas maltophilia NOT DETECTED NOT DETECTED Final   Candida albicans NOT DETECTED NOT DETECTED Final   Candida auris NOT DETECTED NOT DETECTED Final   Candida glabrata NOT DETECTED NOT DETECTED Final   Candida krusei NOT DETECTED NOT DETECTED Final   Candida parapsilosis NOT DETECTED NOT DETECTED Final   Candida tropicalis NOT DETECTED NOT DETECTED Final   Cryptococcus neoformans/gattii NOT DETECTED NOT DETECTED Final   CTX-M ESBL NOT DETECTED NOT DETECTED Final   Carbapenem resistance IMP NOT DETECTED NOT DETECTED Final   Carbapenem resistance KPC NOT DETECTED NOT DETECTED Final   Carbapenem resistance NDM NOT DETECTED NOT DETECTED Final   Carbapenem resist OXA 48 LIKE NOT DETECTED NOT DETECTED Final   Carbapenem resistance VIM NOT DETECTED NOT DETECTED Final    Comment: Performed at [facility], [address] [place], [place] [address]  Resp Panel by RT-PCR (Flu A&B, Covid)     Status: None   Collection Time: 01/10/22  1:40 PM  Specimen: Nasal Swab  Result Value Ref Range Status   SARS Coronavirus 2 by RT PCR NEGATIVE NEGATIVE Final    Comment: (NOTE) SARS-CoV-2 target nucleic acids are NOT DETECTED.  The SARS-CoV-2 RNA is generally detectable in upper respiratory specimens during the acute phase of infection. The lowest concentration of SARS-CoV-2 viral copies this assay can detect is 138 copies/mL. A negative result does not preclude SARS-Cov-2 infection and should not be used as the sole basis for treatment or other patient management decisions. A negative result may occur with  improper specimen collection/handling, submission of specimen other than nasopharyngeal swab, presence of viral mutation(s) within the areas targeted by this assay, and inadequate number of viral copies(<138 copies/mL). A negative result must be combined with clinical observations, patient  history, and epidemiological information. The expected result is Negative.  Fact Sheet for Patients:  EntrepreneurPulse.com.au  Fact Sheet for Healthcare Providers:  IncredibleEmployment.be  This test is no t yet approved or cleared by the [place] FDA and  has been authorized for detection and/or diagnosis of SARS-CoV-2 by FDA under an Emergency Use Authorization (EUA). This EUA will remain  in effect (meaning this test can be used) for the duration of the COVID-19 declaration under Section 564(b)(1) of the Act, 21 U.S.C.section 360bbb-3(b)(1), unless the authorization is terminated  or revoked sooner.       Influenza A by PCR NEGATIVE NEGATIVE Final   Influenza B by PCR NEGATIVE NEGATIVE Final    Comment: (NOTE) The Xpert Xpress SARS-CoV-2/FLU/RSV plus assay is intended as an aid in the diagnosis of influenza from Nasopharyngeal swab specimens and should not be used as a sole basis for treatment. Nasal washings and aspirates are unacceptable for Xpert Xpress SARS-CoV-2/FLU/RSV testing.  Fact Sheet for Patients: EntrepreneurPulse.com.au  Fact Sheet for Healthcare Providers: IncredibleEmployment.be  This test is not yet approved or cleared by the [place] FDA and has been authorized for detection and/or diagnosis of SARS-CoV-2 by FDA under an Emergency Use Authorization (EUA). This EUA will remain in effect (meaning this test can be used) for the duration of the COVID-19 declaration under Section 564(b)(1) of the Act, 21 U.S.C. section 360bbb-3(b)(1), unless the authorization is terminated or revoked.  Performed at [facility], [place] [address], [place], [place] [address]   Urine Culture     Status: Abnormal   Collection Time: 01/10/22  1:40 PM   Specimen: In/Out Cath Urine  Result Value Ref Range Status   Specimen Description   Final    IN/OUT CATH URINE Performed at  [facility] [address], [place], [place] [address]    Special Requests   Final    NONE Performed at [facility], [place] [address], [place], [place] [address]    Culture 3,000 COLONIES/mL ENTEROCOCCUS FAECALIS (A)  Final   Report Status 01/13/2022 FINAL  Final   Organism ID, Bacteria ENTEROCOCCUS FAECALIS (A)  Final      Susceptibility   Enterococcus faecalis - MIC*    AMPICILLIN <=2 SENSITIVE Sensitive     NITROFURANTOIN <=16 SENSITIVE Sensitive     VANCOMYCIN 1 SENSITIVE Sensitive     * 3,000 COLONIES/mL ENTEROCOCCUS FAECALIS  MRSA Next Gen by PCR, Nasal     Status: None   Collection Time: 01/10/22  6:03 PM   Specimen: Nasal Mucosa; Nasal Swab  Result Value Ref Range Status   MRSA by PCR Next Gen NOT DETECTED NOT DETECTED Final    Comment: (NOTE) The  GeneXpert MRSA Assay (FDA approved for NASAL specimens only), is one component of a comprehensive MRSA colonization surveillance program. It is not intended to diagnose MRSA infection nor to guide or monitor treatment for MRSA infections. Test performance is not FDA approved in patients less than 63 years old. Performed at [facility], [place] [address], [place], [place] [address]   Culture, blood (Routine X 2) w Reflex to ID Panel     Status: None (Preliminary result)   Collection Time: 01/13/22  6:37 AM   Specimen: BLOOD RIGHT ARM  Result Value Ref Range Status   Specimen Description   Final    BLOOD RIGHT ARM Performed at [facility] [address], [place], [place] [address]    Special Requests   Final    BOTTLES DRAWN AEROBIC AND ANAEROBIC Blood Culture adequate volume Performed at [facility] [address], [place], [place] [address]    Culture   Final    NO GROWTH 4 DAYS Performed at [facility], [place] [address] [place], [place] [address]    Report Status PENDING  Incomplete  Culture, blood (Routine X 2) w  Reflex to ID Panel     Status: None (Preliminary result)   Collection Time: 01/13/22  6:46 AM   Specimen: BLOOD  Result Value Ref Range Status   Specimen Description   Final    BLOOD BLOOD LEFT HAND Performed at [facility] [address], [place], [place] [address]    Special Requests   Final    BOTTLES DRAWN AEROBIC AND ANAEROBIC Blood Culture results may not be optimal due to an inadequate volume of blood received in culture bottles Performed at [facility] [address], [place], [place] [address]    Culture   Final    NO GROWTH 4 DAYS Performed at [facility], [place] [address] [place],  [address]    Report Status PENDING  Incomplete      Radiology Studies: No results found.    LOS: 7 days    Cordelia Poche, MD [facility] 01/17/2022, 3:22 PM   If 7PM-7AM, please contact night-coverage [email or web address]

## 2022-01-17 NOTE — CV Procedure (Addendum)
     PROCEDURE NOTE:  Procedure:  Transesophageal echocardiogram Operator:  Fransico Him, MD Indications:  bacteremia Complications: None  During this procedure the patient is administered a total of Lidocaine 100mg  and Propofol 300 mg to achieve and maintain moderate conscious sedation.  The patient's heart rate, blood pressure, and oxygen saturation are monitored continuously during the procedure by anesthesia.   Results: Normal LV size and function Normal RV size and function Normal RA Normal LA and very small LA appendage with no thrombus Normal TV with trivial TR Normal PV with trivial PR Normal MV with trivial MR Trileaflet AV with trivial AR.  There is a very tiny density on the ventricular side of the AV of unclear significance.  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   Normal thoracic and ascending aorta. No obvious vegetation except abnormality noted above under AV  The patient tolerated the procedure well and was transferred back to their room in stable condition.  Signed: Fransico Him, MD [facility]

## 2022-01-17 NOTE — Progress Notes (Signed)
Progress Note  4 Days Post-Op  Subjective: Pt reports pain that is generalized. Does not report specific RUQ pain with palpation or with PO intake. Patient NPO this AM and reports mild nausea but tolerated PO yesterday without N/V. Having bowel function. Reports UOP good. Reports some mild chest pain without exertion. Denies DOE or worsening swelling.   Objective: Vital signs in last 24 hours: Temp:  [97.8 F (36.6 C)-98.3 F (36.8 C)] 98 F (36.7 C) (11/13 0556) Pulse Rate:  [59-72] 65 (11/13 0556) Resp:  [15-20] 18 (11/13 0556) BP: (126-146)/(74-76) 143/75 (11/13 0556) SpO2:  [96 %-100 %] 97 % (11/13 0556) Last BM Date : 01/17/22  Intake/Output from previous day: 11/12 0701 - 11/13 0700 In: 971.8 [P.O.:600; IV Piggyback:371.8] Out: 1000 [Urine:1000] Intake/Output this shift: No intake/output data recorded.  PE: General: pleasant, WD, obese male who is laying in bed in NAD HEENT: sclera anicteric  Heart: regular, rate, and rhythm.Palpable radial pulses bilaterally Lungs: Respiratory effort nonlabored Abd: soft, NT, ND MS: trace edema in BLE, no UE edema noted BL Skin: warm and dry with no masses, lesions, or rashes Psych: A&Ox3 with an appropriate affect.    Lab Results:  Recent Labs    01/16/22 0626 01/17/22 0634  WBC 6.1 5.4  HGB 11.3* 11.5*  HCT 35.6* 36.1*  PLT 173 200   BMET Recent Labs    01/16/22 0626 01/17/22 0634  NA 144 141  K 4.4 3.1*  CL 109 106  CO2 30 27  GLUCOSE 105* 113*  BUN 23 16  CREATININE 1.03 1.01  CALCIUM 8.4* 8.2*   PT/INR No results for input(s): "LABPROT", "INR" in the last 72 hours. CMP     Component Value Date/Time   NA 141 01/17/2022 0634   K 3.1 (L) 01/17/2022 0634   CL 106 01/17/2022 0634   CO2 27 01/17/2022 0634   GLUCOSE 113 (H) 01/17/2022 0634   BUN 16 01/17/2022 0634   CREATININE 1.01 01/17/2022 0634   CREATININE 1.20 10/19/2021 1150   CREATININE 1.09 07/22/2016 1111   CALCIUM 8.2 (L) 01/17/2022 0634    PROT 5.8 (L) 01/17/2022 0634   ALBUMIN 2.8 (L) 01/17/2022 0634   AST 77 (H) 01/17/2022 0634   AST 22 10/19/2021 1150   ALT 142 (H) 01/17/2022 0634   ALT 36 10/19/2021 1150   ALKPHOS 74 01/17/2022 0634   BILITOT 1.0 01/17/2022 0634   BILITOT 0.8 10/19/2021 1150   GFRNONAA >60 01/17/2022 0634   GFRNONAA >60 10/19/2021 1150   GFRAA >60 01/16/2017 0405   Lipase     Component Value Date/Time   LIPASE 45 01/10/2022 1448       Studies/Results: No results found.  Anti-infectives: Anti-infectives (From admission, onward)    Start     Dose/Rate Route Frequency Ordered Stop   01/13/22 1400  Ampicillin-Sulbactam (UNASYN) 3 g in sodium chloride 0.9 % 100 mL IVPB        3 g 200 mL/hr over 30 Minutes Intravenous Every 6 hours 01/13/22 1018     01/12/22 1500  piperacillin-tazobactam (ZOSYN) IVPB 3.375 g  Status:  Discontinued        3.375 g 12.5 mL/hr over 240 Minutes Intravenous Every 8 hours 01/12/22 1337 01/13/22 1018   01/11/22 1400  vancomycin (VANCOREADY) IVPB 1500 mg/300 mL  Status:  Discontinued        1,500 mg 150 mL/hr over 120 Minutes Intravenous Every 24 hours 01/10/22 1611 01/11/22 0533   01/11/22 1300  cefTRIAXone (  ROCEPHIN) 2 g in sodium chloride 0.9 % 100 mL IVPB  Status:  Discontinued        2 g 200 mL/hr over 30 Minutes Intravenous Every 24 hours 01/11/22 0533 01/12/22 1337   01/11/22 1300  vancomycin (VANCOREADY) IVPB 1250 mg/250 mL  Status:  Discontinued        1,250 mg 166.7 mL/hr over 90 Minutes Intravenous Every 24 hours 01/11/22 0922 01/12/22 1337   01/10/22 2200  ceFEPIme (MAXIPIME) 2 g in sodium chloride 0.9 % 100 mL IVPB  Status:  Discontinued        2 g 200 mL/hr over 30 Minutes Intravenous Every 8 hours 01/10/22 1521 01/11/22 0533   01/10/22 2200  metroNIDAZOLE (FLAGYL) IVPB 500 mg  Status:  Discontinued        500 mg 100 mL/hr over 60 Minutes Intravenous Every 12 hours 01/10/22 1521 01/11/22 0824   01/10/22 1230  ceFEPIme (MAXIPIME) 2 g in sodium  chloride 0.9 % 100 mL IVPB        2 g 200 mL/hr over 30 Minutes Intravenous  Once 01/10/22 1221 01/10/22 1324   01/10/22 1230  metroNIDAZOLE (FLAGYL) IVPB 500 mg        500 mg 100 mL/hr over 60 Minutes Intravenous  Once 01/10/22 1221 01/10/22 1325   01/10/22 1230  vancomycin (VANCOCIN) IVPB 1000 mg/200 mL premix  Status:  Discontinued        1,000 mg 200 mL/hr over 60 Minutes Intravenous  Once 01/10/22 1221 01/10/22 1228   01/10/22 1230  vancomycin (VANCOREADY) IVPB 2000 mg/400 mL        2,000 mg 200 mL/hr over 120 Minutes Intravenous  Once 01/10/22 1228 01/10/22 1537        Assessment/Plan  E. Coli bacteremia Enterococcus casseliflavus bacteremia Cholelithiasis, choledocholithiasis, possible cholangitis   - CT 11/6 with choledocholithiasis - RUQ [place] 11/6 without cholecystitis or definitive choledocholithiasis - S/P ERCP 11/9: Choledocholithiasis found, extension sphincterotomy and common bile duct stone extraction, filling of the biliary tree and cystic duct but not the gallbladder, erosive duodenitis - LFTs continue to down trend, no leukocytosis, no PO intolerance yesterday - port removed 11/10, on IV abx   Continuing current care - TEE planned for today  Potential for lap chole this week if medically cleared.  Again, he would be higher risk for complications given medical condition, obesity, prior laparotomy   FEN: NPO for TEE today VTE: SCDs, ok to have LMWH or SQH from a surgical standpoint ID: [email or web address] 11/9>>  CAD s/p stent in 2018, normaly on brilinta HTN HLD COPD Chronic diastolic CHF Stage IV NSC lung cancer   LOS: 7 days   I reviewed hospitalist notes, last 24 h vitals and pain scores, last 48 h intake and output, and last 24 h labs and trends.     Norm Parcel, [facility] 01/17/2022, 9:18 AM Please see Amion for pager number during day hours 7:00am-4:30pm

## 2022-01-17 NOTE — TOC Progression Note (Addendum)
Transition of Care [facility]) - Progression Note    Patient Details  Name: [name] [name] MRN: [id number] Date of Birth: 01/27/1956  Transition of Care [facility]) CM/SW [place], RN Phone Number:[phone]  01/17/2022, 3:43 PM  Clinical Narrative:    Patient is active with [facility] per [facility].         Expected Discharge Plan and Services                                                 Social Determinants of Health (SDOH) Interventions    Readmission Risk Interventions    02/12/2021   12:15 PM  Readmission Risk Prevention Plan  Transportation Screening Complete  PCP or Specialist Appt within 3-5 Days Complete

## 2022-01-17 NOTE — Anesthesia Preprocedure Evaluation (Signed)
Anesthesia Evaluation  Patient identified by MRN, date of birth, ID band Patient awake    Reviewed: Allergy & Precautions, NPO status , Patient's Chart, lab work & pertinent test results  Airway Mallampati: II  TM Distance: >3 FB Neck ROM: Full    Dental  (+) Dental Advisory Given, Edentulous Upper   Pulmonary shortness of breath, with exertion and at rest, pneumonia, resolved, COPD,  COPD inhaler, former smoker Hx/o lung Ca   breath sounds clear to auscultation + decreased breath sounds      Cardiovascular hypertension, Pt. on medications + CAD, + Past MI, + Cardiac Stents, +CHF, + Orthopnea and + DOE   Rhythm:Regular Rate:Normal  Echo: 1. Left ventricular ejection fraction, by estimation, is 60 to 65%. The  left ventricle has normal function. The left ventricle has no regional  wall motion abnormalities. Left ventricular diastolic parameters are  indeterminate.   2. Right ventricular systolic function is normal. The right ventricular  size is normal. Tricuspid regurgitation signal is inadequate for assessing  PA pressure.   3. Left atrial size was mildly dilated.   4. Right atrial size was mildly dilated.   5. The mitral valve is grossly normal. No evidence of mitral valve  regurgitation.   6. The aortic valve was not well visualized. Aortic valve regurgitation  is not visualized. No aortic stenosis is present.   7. The inferior vena cava is normal in size with greater than 50%  respiratory variability, suggesting right atrial pressure of 3 mmHg.    Hx/o Inf STEMI S/P DES RCA Stents Lcx and LAD   Neuro/Psych  Headaches PSYCHIATRIC DISORDERS Anxiety Depression     Neuromuscular disease    GI/Hepatic Neg liver ROS,GERD  Medicated,,Ascending cholangitis Hx/o choledocholithiasis S/P ERCP with sphincterotomy   Endo/Other  negative endocrine ROS    Renal/GU negative Renal ROS     Musculoskeletal  (+) Arthritis ,   Fibromyalgia -, narcotic dependent  Abdominal  (+) + obese  Peds  Hematology  (+) Blood dyscrasia, anemia Lovenox therapy- last dose 11/7   Anesthesia Other Findings   Reproductive/Obstetrics                             Anesthesia Physical Anesthesia Plan  ASA: 3  Anesthesia Plan: MAC   Post-op Pain Management:    Induction: Intravenous  PONV Risk Score and Plan: 2 and Propofol infusion  Airway Management Planned: Natural Airway and Nasal Cannula  Additional Equipment: None  Intra-op Plan:   Post-operative Plan:   Informed Consent: I have reviewed the patients History and Physical, chart, labs and discussed the procedure including the risks, benefits and alternatives for the proposed anesthesia with the patient or authorized representative who has indicated his/her understanding and acceptance.     Dental advisory given  Plan Discussed with: CRNA and Anesthesiologist  Anesthesia Plan Comments:         Anesthesia Quick Evaluation

## 2022-01-17 NOTE — Transfer of Care (Signed)
Immediate Anesthesia Transfer of Care Note  Patient: [name] [name]  Procedure(s) Performed: TRANSESOPHAGEAL ECHOCARDIOGRAM (TEE) BUBBLE STUDY  Patient Location: PACU  Anesthesia Type:MAC  Level of Consciousness: drowsy and patient cooperative  Airway & Oxygen Therapy: Patient Spontanous Breathing  Post-op Assessment: Report given to RN and Post -op Vital signs reviewed and stable  Post vital signs: Reviewed and stable  Last Vitals:  Vitals Value Taken Time  BP    Temp    Pulse    Resp    SpO2      Last Pain:  Vitals:   01/17/22 0955  TempSrc: Temporal  PainSc: 7       Patients Stated Pain Goal: 0 (24/19/91 4445)  Complications: No notable events documented.

## 2022-01-18 ENCOUNTER — Encounter (HOSPITAL_COMMUNITY): Payer: Self-pay | Admitting: Cardiology

## 2022-01-18 DIAGNOSIS — R7881 Bacteremia: Secondary | ICD-10-CM | POA: Diagnosis not present

## 2022-01-18 DIAGNOSIS — K8050 Calculus of bile duct without cholangitis or cholecystitis without obstruction: Secondary | ICD-10-CM | POA: Diagnosis not present

## 2022-01-18 DIAGNOSIS — R1010 Upper abdominal pain, unspecified: Secondary | ICD-10-CM | POA: Diagnosis not present

## 2022-01-18 DIAGNOSIS — T827XXD Infection and inflammatory reaction due to other cardiac and vascular devices, implants and grafts, subsequent encounter: Secondary | ICD-10-CM | POA: Diagnosis not present

## 2022-01-18 DIAGNOSIS — K8051 Calculus of bile duct without cholangitis or cholecystitis with obstruction: Secondary | ICD-10-CM | POA: Diagnosis not present

## 2022-01-18 DIAGNOSIS — R748 Abnormal levels of other serum enzymes: Secondary | ICD-10-CM | POA: Diagnosis not present

## 2022-01-18 LAB — COMPREHENSIVE METABOLIC PANEL
ALT: 149 U/L — ABNORMAL HIGH (ref 0–44)
AST: 80 U/L — ABNORMAL HIGH (ref 15–41)
Albumin: 2.7 g/dL — ABNORMAL LOW (ref 3.5–5.0)
Alkaline Phosphatase: 72 U/L (ref 38–126)
Anion gap: 5 (ref 5–15)
BUN: 14 mg/dL (ref 8–23)
CO2: 31 mmol/L (ref 22–32)
Calcium: 8.4 mg/dL — ABNORMAL LOW (ref 8.9–10.3)
Chloride: 105 mmol/L (ref 98–111)
Creatinine, Ser: 0.96 mg/dL (ref 0.61–1.24)
GFR, Estimated: >60 mL/min (ref >60)
Glucose, Bld: 105 mg/dL — ABNORMAL HIGH (ref 70–99)
Potassium: 3.4 mmol/L — ABNORMAL LOW (ref 3.5–5.1)
Sodium: 141 mmol/L (ref 135–145)
Total Bilirubin: 1 mg/dL (ref 0.3–1.2)
Total Protein: 6.1 g/dL — ABNORMAL LOW (ref 6.5–8.1)

## 2022-01-18 LAB — CBC WITH DIFFERENTIAL/PLATELET
Abs Immature Granulocytes: 0.11 10*3/uL — ABNORMAL HIGH (ref 0.00–0.07)
Basophils Absolute: 0 10*3/uL (ref 0.0–0.1)
Basophils Relative: 0 %
Eosinophils Absolute: 0.4 10*3/uL (ref 0.0–0.5)
Eosinophils Relative: 8 %
HCT: 37.5 % — ABNORMAL LOW (ref 39.0–52.0)
Hemoglobin: 12.2 g/dL — ABNORMAL LOW (ref 13.0–17.0)
Immature Granulocytes: 2 %
Lymphocytes Relative: 31 %
Lymphs Abs: 1.6 10*3/uL (ref 0.7–4.0)
MCH: 32.4 pg (ref 26.0–34.0)
MCHC: 32.5 g/dL (ref 30.0–36.0)
MCV: 99.7 fL (ref 80.0–100.0)
Monocytes Absolute: 0.5 10*3/uL (ref 0.1–1.0)
Monocytes Relative: 10 %
Neutro Abs: 2.5 10*3/uL (ref 1.7–7.7)
Neutrophils Relative %: 49 %
Platelets: 227 10*3/uL (ref 150–400)
RBC: 3.76 MIL/uL — ABNORMAL LOW (ref 4.22–5.81)
RDW: 14.6 % (ref 11.5–15.5)
WBC: 5.1 10*3/uL (ref 4.0–10.5)
nRBC: 0 % (ref 0.0–0.2)

## 2022-01-18 LAB — CULTURE, BLOOD (ROUTINE X 2)
Culture: NO GROWTH
Culture: NO GROWTH
Special Requests: ADEQUATE

## 2022-01-18 LAB — URINALYSIS, ROUTINE W REFLEX MICROSCOPIC
Bilirubin Urine: NEGATIVE
Glucose, UA: NEGATIVE mg/dL
Hgb urine dipstick: NEGATIVE
Ketones, ur: NEGATIVE mg/dL
Leukocytes,Ua: NEGATIVE
Nitrite: NEGATIVE
Protein, ur: NEGATIVE mg/dL
Specific Gravity, Urine: 1.018 (ref 1.005–1.030)
pH: 6 (ref 5.0–8.0)

## 2022-01-18 MED ORDER — CLONAZEPAM 1 MG PO TABS
1.0000 mg | ORAL_TABLET | Freq: Two times a day (BID) | ORAL | Status: DC | PRN
  Administered 2022-01-18 – 2022-01-22 (×7): 1 mg via ORAL
  Filled 2022-01-18 (×7): qty 1

## 2022-01-18 MED ORDER — AMOXICILLIN 250 MG PO CAPS: 1000.0000 mg | ORAL_CAPSULE | Freq: Three times a day (TID) | ORAL | Status: DC

## 2022-01-18 MED ORDER — POTASSIUM CHLORIDE CRYS ER 20 MEQ PO TBCR
40.0000 meq | EXTENDED_RELEASE_TABLET | Freq: Once | ORAL | Status: AC
  Administered 2022-01-18: 40 meq via ORAL
  Filled 2022-01-18: qty 2

## 2022-01-18 NOTE — Progress Notes (Signed)
PROGRESS NOTE    [name] [name]  LTJ:[id number] DOB: [birth year] DOA: 01/10/2022 PCP: [name], MD   Brief Narrative: [name] [name] is a 66 y.o. male with a history of depression, anxiety, chronic pain, osteoarthritis, COPD, hyperlipidemia, fibromyalgia, GERD, migraine, hypertension, obesity. Patient presented secondary to abdominal pain, nausea and vomiting with evidence of a CBD stone. Patient met sepsis criteria on admission and was found to have an E. Coli and Enterococcus casseliflavus bacteremia. Antibiotics tailored to cover bacteremia. GI plan performed ERCP on 11/9 with sphincterotomy and CBD stone extraction. Plan for cholecystectomy.   Assessment and Plan:  Choledocholithiasis Elevated AST/ALT with CT imaging significant for suspicion of a 4 mm stone at the ampulla. GI and general surgery consulted. ERCP with sphincterotomy and common bile duct stone extraction performed on 11/9. AST/ALT/bilirubin trended down with slight rise; now downtrending again. Persistent abdominal pain -GI recommendations: signed off -General surgery recommendations: Planning laparoscopic cholecystectomy on 11/15 -Daily CMP  Sepsis Present on admission. Secondary to E. Coli and enterococcus casseliflavus bacteremia likely related to above source. Antibiotics as mentioned below.  E. Coli bacteremia Enterococcus casseliflavus bacteremia Patient empirically managed on Vancomycin, Flagyl and Cefepime. He was transitioned to Ceftriaxone and Vancomycin for E. Coli and GPCs noted on culture data and now transitioned to Zosyn with final identification of bacteria. ID consulted. Port-a-Cath removed on 11/10 by IR. Transesophageal Echocardiogram (11/13) without obvious evidence of vegetation -Unasyn IV per ID -ID recommendations: pending today  Acute metabolic encephalopathy Presumed secondary to sepsis. Resolved.  Chronic pain Patient's home regimen held on admission and patient started on a  dilaudid PCA pump. -Continue MS Contin -Continue dilaudid PRN  Stage IV non-small cell right lung cancer Noted. Patient under observation. Per oncologist, patient is not enrolled in hospice and expects at least 1-2 years life expectancy for patient. Oncology has no barriers to surgery if needed. Port removed on 11/10.  CAD History of PCI of RCA in 2018. Currently on Brilinta which is held secondary to plan for ERCP.  Chronic diastolic heart failure Noted. Currently without acute heart failure. Home Lasix held. -Daily weights and strict in/out  Primary hypertension Patient appears to be managed on diuretic monotherapy. Lasix held secondary to sepsis.  Hyperlipidemia Patient is on Crestor as an outpatient which was held on admission. Elevated AST/ALT secondary to choledocholithiasis -Continue to hold Crestor for now  Anxiety -Discontinue Ativan and resume home clonazepam (adjusted to BID prn while inpatient)  COPD -Continue Xopenex PRN  Thrombocytopenia Likely secondary to sepsis. Resolved.  DVT prophylaxis: Lovenox Code Status:   Code Status: Full Code Family Communication: Sister and niece at bedside Disposition Plan: Discharge home likely in 2-4 days pending transition to outpatient antibiotic regimen, specialist recommendations/management/laparoscopic cholecystectomy   Consultants:  General surgery Gastroenterology Infectious disease  Procedures:  11/9: ERCP 11/10: Chest port removal 11/13: Transesophageal Echocardiogram  Antimicrobials: Vancomycin Cefepime Flagyl Ceftriaxone Zosyn   Subjective: Anxiety. No other concerns.  Objective: BP 134/74 (BP Location: Left Arm)   Pulse 61   Temp 97.7 F (36.5 C) (Oral)   Resp 14   Ht _0  (1.753 m)   Wt 131.8 kg   SpO2 99%   BMI 42.91 kg/m   Examination:  General exam: Appears calm and comfortable Respiratory system: Clear to auscultation. Respiratory effort normal. Cardiovascular system: S1 & S2  heard, RRR. No murmurs, rubs, gallops or clicks. Gastrointestinal system: Abdomen is distended, soft and nontender. No organomegaly or masses felt. Normal bowel sounds heard. Central  nervous system: Alert and oriented. No focal neurological deficits. Musculoskeletal: No calf tenderness Skin: No cyanosis. No rashes Psychiatry: Judgement and insight appear normal. Mood & affect appropriate.    Data Reviewed: I have personally reviewed following labs and imaging studies  CBC Lab Results  Component Value Date   WBC 5.1 01/18/2022   RBC 3.76 (L) 01/18/2022   HGB 12.2 (L) 01/18/2022   HCT 37.5 (L) 01/18/2022   MCV 99.7 01/18/2022   MCH 32.4 01/18/2022   PLT 227 01/18/2022   MCHC 32.5 01/18/2022   RDW 14.6 01/18/2022   LYMPHSABS 1.6 01/18/2022   MONOABS 0.5 01/18/2022   EOSABS 0.4 01/18/2022   BASOSABS 0.0 81/27/5170     Last metabolic panel Lab Results  Component Value Date   NA 141 01/18/2022   K 3.4 (L) 01/18/2022   CL 105 01/18/2022   CO2 31 01/18/2022   BUN 14 01/18/2022   CREATININE 0.96 01/18/2022   GLUCOSE 105 (H) 01/18/2022   GFRNONAA >60 01/18/2022   GFRAA >60 01/16/2017   CALCIUM 8.4 (L) 01/18/2022   PHOS 3.8 02/08/2021   PROT 6.1 (L) 01/18/2022   ALBUMIN 2.7 (L) 01/18/2022   BILITOT 1.0 01/18/2022   ALKPHOS 72 01/18/2022   AST 80 (H) 01/18/2022   ALT 149 (H) 01/18/2022   ANIONGAP 5 01/18/2022    GFR: Estimated Creatinine Clearance: 101.8 mL/min (by C-G formula based on SCr of 0.96 mg/dL).  Recent Results (from the past 240 hour(s))  Blood Culture (routine x 2)     Status: Abnormal (Preliminary result)   Collection Time: 01/10/22 12:18 PM   Specimen: BLOOD  Result Value Ref Range Status   Specimen Description   Final    BLOOD LEFT ANTECUBITAL Performed at [facility] [address] [place], [place] [address]    Special Requests   Final    BOTTLES DRAWN AEROBIC AND ANAEROBIC Blood Culture adequate volume Performed at [facility] [address], [place], [place] [address]    Culture  Setup Time   Final    GRAM POSITIVE COCCI IN PAIRS IN BOTH AEROBIC AND ANAEROBIC BOTTLES CRITICAL VALUE NOTED.  VALUE IS CONSISTENT WITH PREVIOUSLY REPORTED AND CALLED VALUE.    Culture (A)  Final    ENTEROCOCCUS CASSELIFLAVUS Sent to [facility] for CONFIRMATION OF identification and speciation. Sent to [facility] for further susceptibility testing. Performed at [facility], [place] [address] [place], [place] [address]    Report Status PENDING  Incomplete  Blood Culture (routine x 2)     Status: Abnormal   Collection Time: 01/10/22 12:36 PM   Specimen: BLOOD  Result Value Ref Range Status   Specimen Description   Final    BLOOD PORTA CATH Performed at [facility] [address], [place], [place] [address]    Special Requests   Final    BOTTLES DRAWN AEROBIC AND ANAEROBIC Blood Culture adequate volume Performed at [facility] [address], [place], [place] [address]    Culture  Setup Time   Final    GRAM POSITIVE COCCI IN PAIRS GRAM NEGATIVE RODS IN BOTH AEROBIC AND ANAEROBIC BOTTLES CRITICAL RESULT CALLED TO, READ BACK BY AND VERIFIED WITH: PHARMD D. POINDEXTER 01/11/22 _0  BY AB    Culture (A)  Final    ESCHERICHIA COLI CORRECTED RESULTS ENTEROCOCCUS CASSELIFLAVUS PREVIOUSLY REPORTED AS: ENTEROCOCCUS FAECALIS CORRECTED RESULTS CALLED TO: PHARMD TERRY G. [phone] FCP SUSCEPTIBILITIES PERFORMED ON PREVIOUS CULTURE WITHIN  THE LAST 5 DAYS. Performed at [facility], [place] [address], [place], [place] [address]    Report Status 01/14/2022 FINAL  Final   Organism ID, Bacteria ESCHERICHIA COLI  Final      Susceptibility   Escherichia coli - MIC*    AMPICILLIN 8 SENSITIVE Sensitive     CEFAZOLIN <=4 SENSITIVE Sensitive     CEFEPIME <=0.12 SENSITIVE Sensitive     CEFTAZIDIME <=1 SENSITIVE Sensitive     CEFTRIAXONE <=0.25 SENSITIVE  Sensitive     CIPROFLOXACIN <=0.25 SENSITIVE Sensitive     GENTAMICIN <=1 SENSITIVE Sensitive     IMIPENEM <=0.25 SENSITIVE Sensitive     TRIMETH/SULFA <=20 SENSITIVE Sensitive     AMPICILLIN/SULBACTAM 4 SENSITIVE Sensitive     PIP/TAZO <=4 SENSITIVE Sensitive     * ESCHERICHIA COLI  Blood Culture ID Panel (Reflexed)     Status: Abnormal   Collection Time: 01/10/22 12:36 PM  Result Value Ref Range Status   Enterococcus faecalis NOT DETECTED NOT DETECTED Final   Enterococcus Faecium NOT DETECTED NOT DETECTED Final   Listeria monocytogenes NOT DETECTED NOT DETECTED Final   Staphylococcus species NOT DETECTED NOT DETECTED Final   Staphylococcus aureus (BCID) NOT DETECTED NOT DETECTED Final   Staphylococcus epidermidis NOT DETECTED NOT DETECTED Final   Staphylococcus lugdunensis NOT DETECTED NOT DETECTED Final   Streptococcus species NOT DETECTED NOT DETECTED Final   Streptococcus agalactiae NOT DETECTED NOT DETECTED Final   Streptococcus pneumoniae NOT DETECTED NOT DETECTED Final   Streptococcus pyogenes NOT DETECTED NOT DETECTED Final   A.calcoaceticus-baumannii NOT DETECTED NOT DETECTED Final   Bacteroides fragilis NOT DETECTED NOT DETECTED Final   Enterobacterales DETECTED (A) NOT DETECTED Final    Comment: Enterobacterales represent a large order of gram negative bacteria, not a single organism. CRITICAL RESULT CALLED TO, READ BACK BY AND VERIFIED WITH: PHARMD D. POINDEXTER 01/11/22 _0  BY AB    Enterobacter cloacae complex NOT DETECTED NOT DETECTED Final   Escherichia coli DETECTED (A) NOT DETECTED Final    Comment: CRITICAL RESULT CALLED TO, READ BACK BY AND VERIFIED WITH: PHARMD D. POINDEXTER 01/11/22 _1  BY AB    Klebsiella aerogenes NOT DETECTED NOT DETECTED Final   Klebsiella oxytoca NOT DETECTED NOT DETECTED Final   Klebsiella pneumoniae NOT DETECTED NOT DETECTED Final   Proteus species NOT DETECTED NOT DETECTED Final   Salmonella species NOT DETECTED NOT DETECTED  Final   Serratia marcescens NOT DETECTED NOT DETECTED Final   Haemophilus influenzae NOT DETECTED NOT DETECTED Final   Neisseria meningitidis NOT DETECTED NOT DETECTED Final   Pseudomonas aeruginosa NOT DETECTED NOT DETECTED Final   Stenotrophomonas maltophilia NOT DETECTED NOT DETECTED Final   Candida albicans NOT DETECTED NOT DETECTED Final   Candida auris NOT DETECTED NOT DETECTED Final   Candida glabrata NOT DETECTED NOT DETECTED Final   Candida krusei NOT DETECTED NOT DETECTED Final   Candida parapsilosis NOT DETECTED NOT DETECTED Final   Candida tropicalis NOT DETECTED NOT DETECTED Final   Cryptococcus neoformans/gattii NOT DETECTED NOT DETECTED Final   CTX-M ESBL NOT DETECTED NOT DETECTED Final   Carbapenem resistance IMP NOT DETECTED NOT DETECTED Final   Carbapenem resistance KPC NOT DETECTED NOT DETECTED Final   Carbapenem resistance NDM NOT DETECTED NOT DETECTED Final   Carbapenem resist OXA 48 LIKE NOT DETECTED NOT DETECTED Final   Carbapenem resistance VIM NOT DETECTED NOT DETECTED Final    Comment: Performed at [facility], [address] [place], [place] [address]  Resp Panel by RT-PCR (Flu A&B, Covid)     Status: None   Collection Time: 01/10/22  1:40 PM   Specimen: Nasal Swab  Result Value Ref Range Status   SARS Coronavirus 2 by RT PCR NEGATIVE NEGATIVE Final    Comment: (NOTE) SARS-CoV-2 target nucleic acids are NOT DETECTED.  The SARS-CoV-2 RNA is generally detectable in upper respiratory specimens during the acute phase of infection. The lowest concentration of SARS-CoV-2 viral copies this assay can detect is 138 copies/mL. A negative result does not preclude SARS-Cov-2 infection and should not be used as the sole basis for treatment or other patient management decisions. A negative result may occur with  improper specimen collection/handling, submission of specimen other than nasopharyngeal swab, presence of viral mutation(s) within the areas targeted  by this assay, and inadequate number of viral copies(<138 copies/mL). A negative result must be combined with clinical observations, patient history, and epidemiological information. The expected result is Negative.  Fact Sheet for Patients:  EntrepreneurPulse.com.au  Fact Sheet for Healthcare Providers:  IncredibleEmployment.be  This test is no t yet approved or cleared by the [place] FDA and  has been authorized for detection and/or diagnosis of SARS-CoV-2 by FDA under an Emergency Use Authorization (EUA). This EUA will remain  in effect (meaning this test can be used) for the duration of the COVID-19 declaration under Section 564(b)(1) of the Act, 21 U.S.C.section 360bbb-3(b)(1), unless the authorization is terminated  or revoked sooner.       Influenza A by PCR NEGATIVE NEGATIVE Final   Influenza B by PCR NEGATIVE NEGATIVE Final    Comment: (NOTE) The Xpert Xpress SARS-CoV-2/FLU/RSV plus assay is intended as an aid in the diagnosis of influenza from Nasopharyngeal swab specimens and should not be used as a sole basis for treatment. Nasal washings and aspirates are unacceptable for Xpert Xpress SARS-CoV-2/FLU/RSV testing.  Fact Sheet for Patients: EntrepreneurPulse.com.au  Fact Sheet for Healthcare Providers: IncredibleEmployment.be  This test is not yet approved or cleared by the [place] FDA and has been authorized for detection and/or diagnosis of SARS-CoV-2 by FDA under an Emergency Use Authorization (EUA). This EUA will remain in effect (meaning this test can be used) for the duration of the COVID-19 declaration under Section 564(b)(1) of the Act, 21 U.S.C. section 360bbb-3(b)(1), unless the authorization is terminated or revoked.  Performed at [facility], [place] [address], [place], [place] [address]   Urine Culture     Status: Abnormal   Collection Time:  01/10/22  1:40 PM   Specimen: In/Out Cath Urine  Result Value Ref Range Status   Specimen Description   Final    IN/OUT CATH URINE Performed at [facility] [address] [place],  [address]    Special Requests   Final    NONE Performed at [facility], [place] [address], [place], [place] [address]    Culture 3,000 COLONIES/mL ENTEROCOCCUS FAECALIS (A)  Final   Report Status 01/13/2022 FINAL  Final   Organism ID, Bacteria ENTEROCOCCUS FAECALIS (A)  Final      Susceptibility   Enterococcus faecalis - MIC*    AMPICILLIN <=2 SENSITIVE Sensitive     NITROFURANTOIN <=16 SENSITIVE Sensitive     VANCOMYCIN 1 SENSITIVE Sensitive     * 3,000 COLONIES/mL ENTEROCOCCUS FAECALIS  MRSA Next Gen by PCR, Nasal     Status: None   Collection Time: 01/10/22  6:03 PM   Specimen: Nasal Mucosa; Nasal Swab  Result Value Ref Range Status   MRSA by PCR Next Gen NOT DETECTED NOT DETECTED Final    Comment: (NOTE) The GeneXpert MRSA Assay (FDA approved for NASAL specimens only), is one component of a comprehensive MRSA colonization surveillance program. It is not intended to diagnose MRSA infection nor to guide or monitor treatment for MRSA infections. Test performance is not FDA approved in patients less than 32 years old. Performed at [facility], [place] [address], [place], [place] [address]   Culture, blood (Routine X 2) w Reflex to ID Panel     Status: None   Collection Time: 01/13/22  6:37 AM   Specimen: BLOOD RIGHT ARM  Result Value Ref Range Status   Specimen Description   Final    BLOOD RIGHT ARM Performed at [facility] [address] [place], [place] [address]    Special Requests   Final    BOTTLES DRAWN AEROBIC AND ANAEROBIC Blood Culture adequate volume Performed at [facility] [address] [place], [place] [address]    Culture   Final    NO GROWTH 5 DAYS Performed at [facility], [place] [address], [place], [place] [address]    Report Status 01/18/2022 FINAL  Final  Culture, blood (Routine X 2) w Reflex to ID Panel     Status: None   Collection Time: 01/13/22  6:46 AM   Specimen: BLOOD  Result Value Ref Range Status   Specimen Description   Final    BLOOD BLOOD LEFT HAND Performed at [facility] [address] [place], [place] [address]    Special Requests   Final    BOTTLES DRAWN AEROBIC AND ANAEROBIC Blood Culture results may not be optimal due to an inadequate volume of blood received in culture bottles Performed at [facility] [address] [place], [place] [address]    Culture   Final    NO GROWTH 5 DAYS Performed at [facility], [place] [address] [place], [place] [address]    Report Status 01/18/2022 FINAL  Final      Radiology Studies: No results found.    LOS: 8 days    Cordelia Poche, MD [facility] 01/18/2022, 12:54 PM   If 7PM-7AM, please contact night-coverage [email or web address]

## 2022-01-18 NOTE — Progress Notes (Signed)
Mobility Specialist - Progress Note   01/18/22 1253  Mobility  Activity Ambulated with assistance in hallway  Level of Assistance Contact guard assist, steadying assist  Assistive Device Front wheel walker  Distance Ambulated (ft) 175 ft  Activity Response Tolerated well  Mobility Referral Yes  $Mobility charge 1 Mobility   Pt received in bed and agreed to mobility, some pain in stomach, claims its from eating. Pt returned to bed with all needs met.   Roderick Pee Mobility Specialist

## 2022-01-18 NOTE — Progress Notes (Signed)
[facility] for Infectious Disease  Date of Admission:  01/10/2022           Follow up on Bacteremia   Current antibiotics: Unasyn  ASSESSMENT:    66 y.o. male admitted with:  #E. coli and Enterococcus casseliflavus bacteremia.  Negative TEE #Choledocholithiasis with history of prior ERCP/sphincterotomy 02/2021 and status post repeat ERCP with sphincterotomy and CBD stone extraction 01/13/2022.  Planned lap chole 01/19/22. #Suspected cholecystitis/cholangitis due to the above #Elevated LFTs due to above #Stage IV non-small cell lung cancer with PAC in place (removed 01/14/22).  Currently on observation therapy and followed by oncology  RECOMMENDATIONS:    Continue Unasyn Will plan for IV therapy x 3 days post lap chole through 01/22/22 then transition to high dose amoxicillin through 01/28/22 to treat his bacteremia x 2 weeks from date of PAC removal Will sign off, please call as needed   Principal Problem:   Sepsis due to undetermined organism [facility]) Active Problems:   CAD S/P percutaneous coronary angioplasty   Essential hypertension   Dyslipidemia, goal LDL below 70   Chronic diastolic congestive heart failure (HCC)   Non-small cell carcinoma of right lung, stage 4 (HCC)   Thrombocytopenia (HCC)   COPD (chronic obstructive pulmonary disease) (HCC)   Abdominal pain   Cholelithiasis   Abnormal liver enzymes   Bacteremia associated with intravascular line ([place])   Choledocholithiasis with obstruction   Acute metabolic encephalopathy    MEDICATIONS:    Scheduled Meds:  [START ON 01/23/2022] amoxicillin  1,000 mg Oral Q8H   Chlorhexidine Gluconate Cloth  6 each Topical Daily   indomethacin  100 mg Rectal Once   lidocaine  1 patch Transdermal Q24H   morphine  60 mg Oral Q12H   mouth rinse  15 mL Mouth Rinse 4 times per day   polyethylene glycol  17 g Oral BID   Continuous Infusions:  ampicillin-sulbactam (UNASYN) IV 3 g (01/18/22 1327)   PRN  Meds:.clonazePAM, HYDROcodone-acetaminophen, levalbuterol, naloxone **AND** sodium chloride flush, mouth rinse, mouth rinse  SUBJECTIVE:     No new complaints Planning for lap chole tmrw Relieved to hear TEE was negative   Review of Systems  All other systems reviewed and are negative.     OBJECTIVE:   Blood pressure 139/74, pulse 72, temperature 98.3 F (36.8 C), temperature source Oral, resp. rate 16, height 5\' 9"  (1.753 m), weight 131.8 kg, SpO2 98 %. Body mass index is 42.91 kg/m.  Physical Exam Constitutional:      General: He is not in acute distress. HENT:     Head: Normocephalic and atraumatic.  Eyes:     General: No scleral icterus.    Extraocular Movements: Extraocular movements intact.  Abdominal:     General: There is no distension.     Palpations: Abdomen is soft.     Tenderness: There is no abdominal tenderness.  Skin:    General: Skin is warm and dry.     Findings: No rash.  Neurological:     General: No focal deficit present.     Mental Status: He is alert. Mental status is at baseline.  Psychiatric:        Mood and Affect: Mood normal.        Behavior: Behavior normal.      Lab Results: Lab Results  Component Value Date   WBC 5.1 01/18/2022   HGB 12.2 (L) 01/18/2022   HCT 37.5 (L) 01/18/2022   MCV 99.7 01/18/2022  PLT 227 01/18/2022    Lab Results  Component Value Date   NA 141 01/18/2022   K 3.4 (L) 01/18/2022   CO2 31 01/18/2022   GLUCOSE 105 (H) 01/18/2022   BUN 14 01/18/2022   CREATININE 0.96 01/18/2022   CALCIUM 8.4 (L) 01/18/2022   GFRNONAA >60 01/18/2022   GFRAA >60 01/16/2017    Lab Results  Component Value Date   ALT 149 (H) 01/18/2022   AST 80 (H) 01/18/2022   ALKPHOS 72 01/18/2022   BILITOT 1.0 01/18/2022    No results found for: "CRP"  No results found for: "ESRSEDRATE"   I have reviewed the micro and lab results in Epic.  Imaging: No results found.   Imaging independently reviewed in Epic.     [name] [name] for Infectious Disease [facility] [phone] pager 01/18/2022, 3:30 PM

## 2022-01-18 NOTE — Progress Notes (Signed)
Progress Note  1 Day Post-Op  Subjective: Pt reports pain in epigastric abdomen this AM. He reports some with eating although he continues to eat bacon and eggs during my exam. Discussed laparoscopic cholecystectomy and patient agreeable to proceed tomorrow.   Objective: Vital signs in last 24 hours: Temp:  [97.3 F (36.3 C)-98.1 F (36.7 C)] 97.7 F (36.5 C) (11/14 0501) Pulse Rate:  [60-75] 61 (11/14 0501) Resp:  [12-20] 14 (11/14 0501) BP: (120-137)/(57-75) 134/74 (11/14 0501) SpO2:  [94 %-99 %] 99 % (11/14 0501) Weight:  [131.8 kg] 131.8 kg (11/14 0501) Last BM Date : 01/17/22  Intake/Output from previous day: 11/13 0701 - 11/14 0700 In: 300 [I.V.:300] Out: 500 [Urine:500] Intake/Output this shift: Total I/O In: 300 [P.O.:300] Out: 700 [Urine:700]  PE: General: pleasant, WD, obese male who is laying in bed in NAD HEENT: sclera anicteric  Lungs: Respiratory effort nonlabored Abd: soft, NT, ND Skin: warm and dry with no masses, lesions, or rashes Psych: A&Ox3 with an appropriate affect.    Lab Results:  Recent Labs    01/17/22 0634 01/18/22 0538  WBC 5.4 5.1  HGB 11.5* 12.2*  HCT 36.1* 37.5*  PLT 200 227    BMET Recent Labs    01/17/22 0634 01/18/22 0538  NA 141 141  K 3.1* 3.4*  CL 106 105  CO2 27 31  GLUCOSE 113* 105*  BUN 16 14  CREATININE 1.01 0.96  CALCIUM 8.2* 8.4*    PT/INR No results for input(s): "LABPROT", "INR" in the last 72 hours. CMP     Component Value Date/Time   NA 141 01/18/2022 0538   K 3.4 (L) 01/18/2022 0538   CL 105 01/18/2022 0538   CO2 31 01/18/2022 0538   GLUCOSE 105 (H) 01/18/2022 0538   BUN 14 01/18/2022 0538   CREATININE 0.96 01/18/2022 0538   CREATININE 1.20 10/19/2021 1150   CREATININE 1.09 07/22/2016 1111   CALCIUM 8.4 (L) 01/18/2022 0538   PROT 6.1 (L) 01/18/2022 0538   ALBUMIN 2.7 (L) 01/18/2022 0538   AST 80 (H) 01/18/2022 0538   AST 22 10/19/2021 1150   ALT 149 (H) 01/18/2022 0538   ALT 36  10/19/2021 1150   ALKPHOS 72 01/18/2022 0538   BILITOT 1.0 01/18/2022 0538   BILITOT 0.8 10/19/2021 1150   GFRNONAA >60 01/18/2022 0538   GFRNONAA >60 10/19/2021 1150   GFRAA >60 01/16/2017 0405   Lipase     Component Value Date/Time   LIPASE 45 01/10/2022 1448       Studies/Results: No results found.  Anti-infectives: Anti-infectives (From admission, onward)    Start     Dose/Rate Route Frequency Ordered Stop   01/13/22 1400  Ampicillin-Sulbactam (UNASYN) 3 g in sodium chloride 0.9 % 100 mL IVPB        3 g 200 mL/hr over 30 Minutes Intravenous Every 6 hours 01/13/22 1018     01/12/22 1500  piperacillin-tazobactam (ZOSYN) IVPB 3.375 g  Status:  Discontinued        3.375 g 12.5 mL/hr over 240 Minutes Intravenous Every 8 hours 01/12/22 1337 01/13/22 1018   01/11/22 1400  vancomycin (VANCOREADY) IVPB 1500 mg/300 mL  Status:  Discontinued        1,500 mg 150 mL/hr over 120 Minutes Intravenous Every 24 hours 01/10/22 1611 01/11/22 0533   01/11/22 1300  cefTRIAXone (ROCEPHIN) 2 g in sodium chloride 0.9 % 100 mL IVPB  Status:  Discontinued        2  g 200 mL/hr over 30 Minutes Intravenous Every 24 hours 01/11/22 0533 01/12/22 1337   01/11/22 1300  vancomycin (VANCOREADY) IVPB 1250 mg/250 mL  Status:  Discontinued        1,250 mg 166.7 mL/hr over 90 Minutes Intravenous Every 24 hours 01/11/22 0922 01/12/22 1337   01/10/22 2200  ceFEPIme (MAXIPIME) 2 g in sodium chloride 0.9 % 100 mL IVPB  Status:  Discontinued        2 g 200 mL/hr over 30 Minutes Intravenous Every 8 hours 01/10/22 1521 01/11/22 0533   01/10/22 2200  metroNIDAZOLE (FLAGYL) IVPB 500 mg  Status:  Discontinued        500 mg 100 mL/hr over 60 Minutes Intravenous Every 12 hours 01/10/22 1521 01/11/22 0824   01/10/22 1230  ceFEPIme (MAXIPIME) 2 g in sodium chloride 0.9 % 100 mL IVPB        2 g 200 mL/hr over 30 Minutes Intravenous  Once 01/10/22 1221 01/10/22 1324   01/10/22 1230  metroNIDAZOLE (FLAGYL) IVPB 500 mg         500 mg 100 mL/hr over 60 Minutes Intravenous  Once 01/10/22 1221 01/10/22 1325   01/10/22 1230  vancomycin (VANCOCIN) IVPB 1000 mg/200 mL premix  Status:  Discontinued        1,000 mg 200 mL/hr over 60 Minutes Intravenous  Once 01/10/22 1221 01/10/22 1228   01/10/22 1230  vancomycin (VANCOREADY) IVPB 2000 mg/400 mL        2,000 mg 200 mL/hr over 120 Minutes Intravenous  Once 01/10/22 1228 01/10/22 1537        Assessment/Plan  E. Coli bacteremia Enterococcus casseliflavus bacteremia Cholelithiasis, choledocholithiasis, possible cholangitis   - CT 11/6 with choledocholithiasis - RUQ [place] 11/6 without cholecystitis or definitive choledocholithiasis - S/P ERCP 11/9: Choledocholithiasis found, extension sphincterotomy and common bile duct stone extraction, filling of the biliary tree and cystic duct but not the gallbladder, erosive duodenitis - LFTs stable, no leukocytosis, no PO intolerance - port removed 11/10, on IV abx   Continuing current care - TEE done yesterday, pt deemed appropriate for surgery  He is higher risk for complications given medical condition, obesity, prior laparotomy   FEN: regular diet, will make NPO after MN VTE: SCDs, ok to have LMWH or SQH from a surgical standpoint ID: unasyn 11/9>>  CAD s/p stent in 2018, normaly on brilinta HTN HLD COPD Chronic diastolic CHF Stage IV NSC lung cancer   LOS: 8 days   I reviewed hospitalist notes, last 24 h vitals and pain scores, last 48 h intake and output, and last 24 h labs and trends.     Norm Parcel, [facility] 01/18/2022, 10:06 AM Please see Amion for pager number during day hours 7:00am-4:30pm

## 2022-01-18 NOTE — Anesthesia Postprocedure Evaluation (Signed)
Anesthesia Post Note  Patient: [name] [name]  Procedure(s) Performed: TRANSESOPHAGEAL ECHOCARDIOGRAM (TEE) BUBBLE STUDY     Patient location during evaluation: PACU Anesthesia Type: MAC Level of consciousness: awake and alert Pain management: pain level controlled Vital Signs Assessment: post-procedure vital signs reviewed and stable Respiratory status: spontaneous breathing, nonlabored ventilation, respiratory function stable and patient connected to nasal cannula oxygen Cardiovascular status: stable and blood pressure returned to baseline Postop Assessment: no apparent nausea or vomiting Anesthetic complications: no   No notable events documented.  Last Vitals:  Vitals:   01/18/22 0501 01/18/22 1408  BP: 134/74 139/74  Pulse: 61 72  Resp: 14 16  Temp: 36.5 C 36.8 C  SpO2: 99% 98%    Last Pain:  Vitals:   01/18/22 1408  TempSrc: Oral  PainSc:                  Tiajuana Amass

## 2022-01-19 ENCOUNTER — Encounter (HOSPITAL_COMMUNITY): Payer: Self-pay | Admitting: Family Medicine

## 2022-01-19 ENCOUNTER — Other Ambulatory Visit: Payer: Self-pay

## 2022-01-19 ENCOUNTER — Inpatient Hospital Stay (HOSPITAL_COMMUNITY): Payer: Medicare Other | Admitting: Certified Registered Nurse Anesthetist

## 2022-01-19 ENCOUNTER — Encounter (HOSPITAL_COMMUNITY)
Admission: EM | Disposition: A | Discharge status: Home Health | Payer: Self-pay | Source: Home / Self Care | Attending: Family Medicine

## 2022-01-19 DIAGNOSIS — I5032 Chronic diastolic (congestive) heart failure: Secondary | ICD-10-CM

## 2022-01-19 DIAGNOSIS — A4150 Gram-negative sepsis, unspecified: Secondary | ICD-10-CM | POA: Diagnosis not present

## 2022-01-19 DIAGNOSIS — R7881 Bacteremia: Secondary | ICD-10-CM

## 2022-01-19 DIAGNOSIS — K8051 Calculus of bile duct without cholangitis or cholecystitis with obstruction: Secondary | ICD-10-CM

## 2022-01-19 DIAGNOSIS — K8309 Other cholangitis: Secondary | ICD-10-CM | POA: Diagnosis not present

## 2022-01-19 DIAGNOSIS — Z9861 Coronary angioplasty status: Secondary | ICD-10-CM

## 2022-01-19 DIAGNOSIS — I2510 Atherosclerotic heart disease of native coronary artery without angina pectoris: Secondary | ICD-10-CM

## 2022-01-19 DIAGNOSIS — F418 Other specified anxiety disorders: Secondary | ICD-10-CM

## 2022-01-19 DIAGNOSIS — K810 Acute cholecystitis: Secondary | ICD-10-CM | POA: Diagnosis not present

## 2022-01-19 DIAGNOSIS — Z87891 Personal history of nicotine dependence: Secondary | ICD-10-CM

## 2022-01-19 DIAGNOSIS — K8060 Calculus of gallbladder and bile duct with cholecystitis, unspecified, without obstruction: Secondary | ICD-10-CM

## 2022-01-19 DIAGNOSIS — J449 Chronic obstructive pulmonary disease, unspecified: Secondary | ICD-10-CM

## 2022-01-19 DIAGNOSIS — C3491 Malignant neoplasm of unspecified part of right bronchus or lung: Secondary | ICD-10-CM

## 2022-01-19 HISTORY — PX: CHOLECYSTECTOMY: SHX55

## 2022-01-19 LAB — CBC WITH DIFFERENTIAL/PLATELET
Abs Immature Granulocytes: 0.07 10*3/uL (ref 0.00–0.07)
Basophils Absolute: 0 10*3/uL (ref 0.0–0.1)
Basophils Relative: 0 %
Eosinophils Absolute: 0.1 10*3/uL (ref 0.0–0.5)
Eosinophils Relative: 1 %
HCT: 36.2 % — ABNORMAL LOW (ref 39.0–52.0)
Hemoglobin: 11.4 g/dL — ABNORMAL LOW (ref 13.0–17.0)
Immature Granulocytes: 1 %
Lymphocytes Relative: 8 %
Lymphs Abs: 0.6 10*3/uL — ABNORMAL LOW (ref 0.7–4.0)
MCH: 32 pg (ref 26.0–34.0)
MCHC: 31.5 g/dL (ref 30.0–36.0)
MCV: 101.7 fL — ABNORMAL HIGH (ref 80.0–100.0)
Monocytes Absolute: 0.2 10*3/uL (ref 0.1–1.0)
Monocytes Relative: 2 %
Neutro Abs: 6 10*3/uL (ref 1.7–7.7)
Neutrophils Relative %: 88 %
Platelets: 240 10*3/uL (ref 150–400)
RBC: 3.56 MIL/uL — ABNORMAL LOW (ref 4.22–5.81)
RDW: 14.6 % (ref 11.5–15.5)
WBC: 6.9 10*3/uL (ref 4.0–10.5)
nRBC: 0 % (ref 0.0–0.2)

## 2022-01-19 LAB — COMPREHENSIVE METABOLIC PANEL
ALT: 133 U/L — ABNORMAL HIGH (ref 0–44)
AST: 68 U/L — ABNORMAL HIGH (ref 15–41)
Albumin: 3 g/dL — ABNORMAL LOW (ref 3.5–5.0)
Alkaline Phosphatase: 64 U/L (ref 38–126)
Anion gap: 4 — ABNORMAL LOW (ref 5–15)
BUN: 12 mg/dL (ref 8–23)
CO2: 26 mmol/L (ref 22–32)
Calcium: 8.2 mg/dL — ABNORMAL LOW (ref 8.9–10.3)
Chloride: 110 mmol/L (ref 98–111)
Creatinine, Ser: 0.97 mg/dL (ref 0.61–1.24)
GFR, Estimated: >60 mL/min (ref >60)
Glucose, Bld: 102 mg/dL — ABNORMAL HIGH (ref 70–99)
Potassium: 3.6 mmol/L (ref 3.5–5.1)
Sodium: 140 mmol/L (ref 135–145)
Total Bilirubin: 0.9 mg/dL (ref 0.3–1.2)
Total Protein: 5.7 g/dL — ABNORMAL LOW (ref 6.5–8.1)

## 2022-01-19 SURGERY — LAPAROSCOPIC CHOLECYSTECTOMY
Anesthesia: General

## 2022-01-19 MED ORDER — MIDAZOLAM HCL 2 MG/2ML IJ SOLN
INTRAMUSCULAR | Status: AC
  Filled 2022-01-19: qty 2

## 2022-01-19 MED ORDER — PHENYLEPHRINE 80 MCG/ML (10ML) SYRINGE FOR IV PUSH (FOR BLOOD PRESSURE SUPPORT)
PREFILLED_SYRINGE | INTRAVENOUS | Status: AC
  Filled 2022-01-19: qty 10

## 2022-01-19 MED ORDER — EPHEDRINE 5 MG/ML INJ
INTRAVENOUS | Status: AC
  Filled 2022-01-19: qty 5

## 2022-01-19 MED ORDER — DEXAMETHASONE SODIUM PHOSPHATE 4 MG/ML IJ SOLN
INTRAMUSCULAR | Status: DC | PRN
  Administered 2022-01-19: 8 mg via INTRAVENOUS

## 2022-01-19 MED ORDER — FENTANYL CITRATE PF 50 MCG/ML IJ SOSY
25.0000 ug | PREFILLED_SYRINGE | INTRAMUSCULAR | Status: DC | PRN
  Administered 2022-01-19: 50 ug via INTRAVENOUS
  Administered 2022-01-19 (×2): 25 ug via INTRAVENOUS
  Administered 2022-01-19: 50 ug via INTRAVENOUS

## 2022-01-19 MED ORDER — FENTANYL CITRATE (PF) 250 MCG/5ML IJ SOLN
INTRAMUSCULAR | Status: AC
  Filled 2022-01-19: qty 5

## 2022-01-19 MED ORDER — BUPIVACAINE HCL (PF) 0.5 % IJ SOLN
INTRAMUSCULAR | Status: AC
  Filled 2022-01-19: qty 30

## 2022-01-19 MED ORDER — LACTATED RINGERS IV SOLN: INTRAVENOUS | Status: DC

## 2022-01-19 MED ORDER — BUPIVACAINE HCL (PF) 0.5 % IJ SOLN
INTRAMUSCULAR | Status: DC | PRN
  Administered 2022-01-19: 30 mL

## 2022-01-19 MED ORDER — PROPOFOL 10 MG/ML IV BOLUS
INTRAVENOUS | Status: AC
  Filled 2022-01-19: qty 20

## 2022-01-19 MED ORDER — CHLORHEXIDINE GLUCONATE CLOTH 2 % EX PADS
6.0000 | MEDICATED_PAD | Freq: Once | CUTANEOUS | Status: AC
  Administered 2022-01-19: 6 via TOPICAL

## 2022-01-19 MED ORDER — AMISULPRIDE (ANTIEMETIC) 5 MG/2ML IV SOLN: 10.0000 mg | Freq: Once | INTRAVENOUS | Status: DC | PRN

## 2022-01-19 MED ORDER — KETAMINE HCL 10 MG/ML IJ SOLN
INTRAMUSCULAR | Status: DC | PRN
  Administered 2022-01-19: 30 mg via INTRAVENOUS
  Administered 2022-01-19: 10 mg via INTRAVENOUS

## 2022-01-19 MED ORDER — LIDOCAINE 2% (20 MG/ML) 5 ML SYRINGE
INTRAMUSCULAR | Status: DC | PRN
  Administered 2022-01-19: 60 mg via INTRAVENOUS

## 2022-01-19 MED ORDER — FENTANYL CITRATE (PF) 250 MCG/5ML IJ SOLN
INTRAMUSCULAR | Status: DC | PRN
  Administered 2022-01-19: 50 ug via INTRAVENOUS
  Administered 2022-01-19: 150 ug via INTRAVENOUS
  Administered 2022-01-19: 50 ug via INTRAVENOUS

## 2022-01-19 MED ORDER — MORPHINE SULFATE (PF) 2 MG/ML IV SOLN
2.0000 mg | INTRAVENOUS | Status: DC | PRN
  Administered 2022-01-19 – 2022-01-20 (×3): 2 mg via INTRAVENOUS
  Filled 2022-01-19 (×3): qty 1

## 2022-01-19 MED ORDER — LIDOCAINE HCL 2 % IJ SOLN
INTRAMUSCULAR | Status: AC
  Filled 2022-01-19: qty 20

## 2022-01-19 MED ORDER — SUGAMMADEX SODIUM 500 MG/5ML IV SOLN
INTRAVENOUS | Status: DC | PRN
  Administered 2022-01-19: 458 mg via INTRAVENOUS

## 2022-01-19 MED ORDER — ONDANSETRON HCL 4 MG/2ML IJ SOLN
INTRAMUSCULAR | Status: DC | PRN
  Administered 2022-01-19: 4 mg via INTRAVENOUS

## 2022-01-19 MED ORDER — FENTANYL CITRATE PF 50 MCG/ML IJ SOSY
PREFILLED_SYRINGE | INTRAMUSCULAR | Status: AC
  Filled 2022-01-19: qty 1

## 2022-01-19 MED ORDER — ROCURONIUM BROMIDE 10 MG/ML (PF) SYRINGE
PREFILLED_SYRINGE | INTRAVENOUS | Status: AC
  Filled 2022-01-19: qty 10

## 2022-01-19 MED ORDER — PROPOFOL 10 MG/ML IV BOLUS
INTRAVENOUS | Status: DC | PRN
  Administered 2022-01-19: 100 mg via INTRAVENOUS

## 2022-01-19 MED ORDER — SUGAMMADEX SODIUM 500 MG/5ML IV SOLN
INTRAVENOUS | Status: AC
  Filled 2022-01-19: qty 5

## 2022-01-19 MED ORDER — ROCURONIUM BROMIDE 10 MG/ML (PF) SYRINGE
PREFILLED_SYRINGE | INTRAVENOUS | Status: DC | PRN
  Administered 2022-01-19: 60 mg via INTRAVENOUS

## 2022-01-19 MED ORDER — ONDANSETRON HCL 4 MG/2ML IJ SOLN
INTRAMUSCULAR | Status: AC
  Filled 2022-01-19: qty 2

## 2022-01-19 MED ORDER — MIDAZOLAM HCL 5 MG/5ML IJ SOLN
INTRAMUSCULAR | Status: DC | PRN
  Administered 2022-01-19 (×2): 1 mg via INTRAVENOUS

## 2022-01-19 MED ORDER — LIDOCAINE HCL (PF) 2 % IJ SOLN
INTRAMUSCULAR | Status: DC | PRN
  Administered 2022-01-19: 1.5 mg/kg/h via INTRADERMAL

## 2022-01-19 MED ORDER — ACETAMINOPHEN 325 MG PO TABS
650.0000 mg | ORAL_TABLET | ORAL | Status: DC | PRN
  Administered 2022-01-19 – 2022-01-20 (×3): 650 mg via ORAL
  Filled 2022-01-19 (×4): qty 2

## 2022-01-19 MED ORDER — HYDROMORPHONE HCL 1 MG/ML IJ SOLN
INTRAMUSCULAR | Status: DC | PRN
  Administered 2022-01-19: 1 mg via INTRAVENOUS

## 2022-01-19 MED ORDER — KETAMINE HCL 50 MG/5ML IJ SOSY
PREFILLED_SYRINGE | INTRAMUSCULAR | Status: AC
  Filled 2022-01-19: qty 5

## 2022-01-19 MED ORDER — PHENYLEPHRINE 80 MCG/ML (10ML) SYRINGE FOR IV PUSH (FOR BLOOD PRESSURE SUPPORT)
PREFILLED_SYRINGE | INTRAVENOUS | Status: DC | PRN
  Administered 2022-01-19 (×2): 80 ug via INTRAVENOUS

## 2022-01-19 MED ORDER — ONDANSETRON HCL 4 MG/2ML IJ SOLN: 4.0000 mg | Freq: Once | INTRAMUSCULAR | Status: DC | PRN

## 2022-01-19 MED ORDER — 0.9 % SODIUM CHLORIDE (POUR BTL) OPTIME
TOPICAL | Status: DC | PRN
  Administered 2022-01-19: 1000 mL

## 2022-01-19 MED ORDER — EPHEDRINE SULFATE (PRESSORS) 50 MG/ML IJ SOLN
INTRAMUSCULAR | Status: DC | PRN
  Administered 2022-01-19: 5 mg via INTRAVENOUS

## 2022-01-19 MED ORDER — DEXAMETHASONE SODIUM PHOSPHATE 10 MG/ML IJ SOLN
INTRAMUSCULAR | Status: AC
  Filled 2022-01-19: qty 1

## 2022-01-19 MED ORDER — LACTATED RINGERS IR SOLN
Status: DC | PRN
  Administered 2022-01-19: 1000 mL

## 2022-01-19 SURGICAL SUPPLY — 39 items
ADH SKN CLS APL DERMABOND .7 (GAUZE/BANDAGES/DRESSINGS) ×1
APL PRP STRL LF DISP 70% ISPRP (MISCELLANEOUS) ×1
APPLIER CLIP 5 13 M/L LIGAMAX5 (MISCELLANEOUS) ×1
APR CLP MED LRG 5 ANG JAW (MISCELLANEOUS) ×1
BAG COUNTER SPONGE SURGICOUNT (BAG) IMPLANT
BAG SPEC RTRVL 10 TROC 200 (ENDOMECHANICALS) ×1
BAG SPNG CNTER NS LX DISP (BAG)
CABLE HIGH FREQUENCY MONO STRZ (ELECTRODE) ×1 IMPLANT
CHLORAPREP W/TINT 26 (MISCELLANEOUS) ×1 IMPLANT
CLIP APPLIE 5 13 M/L LIGAMAX5 (MISCELLANEOUS) ×1 IMPLANT
COVER MAYO STAND XLG (MISCELLANEOUS) ×1 IMPLANT
DERMABOND ADVANCED .7 DNX12 (GAUZE/BANDAGES/DRESSINGS) ×1 IMPLANT
DRAPE C-ARM 42X120 X-RAY (DRAPES) IMPLANT
ELECT REM PT RETURN 15FT ADLT (MISCELLANEOUS) ×1 IMPLANT
GLOVE BIO SURGEON STRL SZ7.5 (GLOVE) ×1 IMPLANT
GOWN STRL REUS W/ TWL XL LVL3 (GOWN DISPOSABLE) ×2 IMPLANT
GOWN STRL REUS W/TWL XL LVL3 (GOWN DISPOSABLE) ×2
HEMOSTAT SURGICEL 4X8 (HEMOSTASIS) IMPLANT
IRRIG SUCT STRYKERFLOW 2 WTIP (MISCELLANEOUS) ×1
IRRIGATION SUCT STRKRFLW 2 WTP (MISCELLANEOUS) ×1 IMPLANT
KIT BASIN OR (CUSTOM PROCEDURE TRAY) ×1 IMPLANT
KIT TURNOVER KIT A (KITS) IMPLANT
PENCIL SMOKE EVACUATOR (MISCELLANEOUS) IMPLANT
POUCH RETRIEVAL ECOSAC 10 (ENDOMECHANICALS) IMPLANT
POUCH RETRIEVAL ECOSAC 10MM (ENDOMECHANICALS) ×1
SCISSORS LAP 5X35 DISP (ENDOMECHANICALS) ×1 IMPLANT
SET CHOLANGIOGRAPH MIX (MISCELLANEOUS) IMPLANT
SET TUBE SMOKE EVAC HIGH FLOW (TUBING) ×1 IMPLANT
SLEEVE Z-THREAD 5X100MM (TROCAR) ×2 IMPLANT
SPIKE FLUID TRANSFER (MISCELLANEOUS) ×1 IMPLANT
SUT MNCRL AB 4-0 PS2 18 (SUTURE) ×1 IMPLANT
SUT VICRYL 0 UR6 27IN ABS (SUTURE) IMPLANT
SYS BAG RETRIEVAL 10MM (BASKET)
SYSTEM BAG RETRIEVAL 10MM (BASKET) ×1 IMPLANT
TOWEL OR 17X26 10 PK STRL BLUE (TOWEL DISPOSABLE) ×1 IMPLANT
TOWEL OR NON WOVEN STRL DISP B (DISPOSABLE) ×1 IMPLANT
TRAY LAPAROSCOPIC (CUSTOM PROCEDURE TRAY) ×1 IMPLANT
TROCAR BALLN 12MMX100 BLUNT (TROCAR) ×1 IMPLANT
TROCAR Z-THREAD OPTICAL 5X100M (TROCAR) ×1 IMPLANT

## 2022-01-19 NOTE — Assessment & Plan Note (Signed)
Treatment with intravenous and oral antibiotic therapy as noted above for associated bacteremia and sepsis

## 2022-01-19 NOTE — Assessment & Plan Note (Addendum)
Status post laparoscopic cholecystectomy earlier today without complication Advancing diet as tolerated Infectious disease recommends 3 days of additional intravenous Unasyn status post laparoscopic cholecystectomy through 11/18 Patient will then need to be transition to high-dose amoxicillin through 11/24 to complete a total of 2 weeks of antibiotic therapy for bacteremia status post removal of Port-A-Cath

## 2022-01-19 NOTE — Progress Notes (Signed)
Patient ID: [name] [name], male   DOB: Jul 22, 1955, 66 y.o.   MRN: [id number]   Plan lap chole today given 2 episodes of CBD stones requiring ERCP. He has been cleared for general anesthesia.  I again discussed proceeding with a lap chole today.  I discussed the procedure in detail.  We discussed the risks and benefits of a laparoscopic cholecystectomy including, but not limited to bleeding, infection, injury to surrounding structures such as the intestine or liver, bile leak, retained gallstones, need to convert to an open procedure, prolonged diarrhea, blood clots such as  DVT, common bile duct injury, anesthesia risks, and possible need for additional procedures.  Again, he is at higher risk of complications given his previous abdominal surgery, morbid obesity, and overall medical health.   He and his family agree to proceed with surgery today

## 2022-01-19 NOTE — Anesthesia Procedure Notes (Signed)
Procedure Name: Intubation Date/Time: 01/19/2022 12:44 PM  Performed by: Deliah Boston, CRNAPre-anesthesia Checklist: Patient identified, Emergency Drugs available, Suction available and Patient being monitored Patient Re-evaluated:Patient Re-evaluated prior to induction Oxygen Delivery Method: Circle system utilized Preoxygenation: Pre-oxygenation with 100% oxygen Induction Type: IV induction Ventilation: Mask ventilation without difficulty Laryngoscope Size: Mac and 4 Grade View: Grade I Tube type: Oral Tube size: 7.5 mm Number of attempts: 1 Airway Equipment and Method: Stylet Placement Confirmation: ETT inserted through vocal cords under direct vision, positive ETCO2 and breath sounds checked- equal and bilateral Secured at: 22 cm Tube secured with: Tape Dental Injury: Teeth and Oropharynx as per pre-operative assessment

## 2022-01-19 NOTE — Discharge Instructions (Signed)
[facility]  Please arrive at least 30 min before your appointment to complete your check in paperwork.  If you are unable to arrive 30 min prior to your appointment time we may have to cancel or reschedule you. LAPAROSCOPIC SURGERY: POST OP INSTRUCTIONS Always review your discharge instruction sheet given to you by the facility where your surgery was performed. IF YOU HAVE DISABILITY OR FAMILY LEAVE FORMS, YOU MUST BRING THEM TO THE OFFICE FOR PROCESSING.   DO NOT GIVE THEM TO YOUR DOCTOR.  PAIN CONTROL  First take acetaminophen (Tylenol) AND/or ibuprofen (Advil) to control your pain after surgery.  Follow directions on package.  Taking acetaminophen (Tylenol) and/or ibuprofen (Advil) regularly after surgery will help to control your pain and lower the amount of prescription pain medication you may need.  You should not take more than 4,000 mg (4 grams) of acetaminophen (Tylenol) in 24 hours.  You should not take ibuprofen (Advil), aleve, motrin, naprosyn or other NSAIDS if you have a history of stomach ulcers or chronic kidney disease.  A prescription for pain medication may be given to you upon discharge.  Take your pain medication as prescribed, if you still have uncontrolled pain after taking acetaminophen (Tylenol) or ibuprofen (Advil). Use ice packs to help control pain. If you need a refill on your pain medication, please contact your pharmacy.  They will contact our office to request authorization. Prescriptions will not be filled after 5pm or on week-ends.  HOME MEDICATIONS Take your usually prescribed medications unless otherwise directed.  DIET You should follow a light diet the first few days after arrival home.  Be sure to include lots of fluids daily. Avoid fatty, fried foods.   CONSTIPATION It is common to experience some constipation after surgery and if you are taking pain medication.  Increasing fluid intake and taking a stool softener (such as Colace)  will usually help or prevent this problem from occurring.  A mild laxative (Milk of Magnesia or Miralax) should be taken according to package instructions if there are no bowel movements after 48 hours.  WOUND/INCISION CARE Most patients will experience some swelling and bruising in the area of the incisions.  Ice packs will help.  Swelling and bruising can take several days to resolve.  Unless discharge instructions indicate otherwise, follow guidelines below  STERI-STRIPS - you may remove your outer bandages 48 hours after surgery, and you may shower at that time.  You have steri-strips (small skin tapes) in place directly over the incision.  These strips should be left on the skin for 7-10 days.   DERMABOND/SKIN GLUE - you may shower in 24 hours.  The glue will flake off over the next 2-3 weeks. Any sutures or staples will be removed at the office during your follow-up visit.  ACTIVITIES You may resume regular (light) daily activities beginning the next day--such as daily self-care, walking, climbing stairs--gradually increasing activities as tolerated.  You may have sexual intercourse when it is comfortable.  Refrain from any heavy lifting or straining until approved by your doctor. You may drive when you are no longer taking prescription pain medication, you can comfortably wear a seatbelt, and you can safely maneuver your car and apply brakes.  FOLLOW-UP You should see your doctor in the office for a follow-up appointment approximately 2-3 weeks after your surgery.  You should have been given your post-op/follow-up appointment when your surgery was scheduled.  If you did not receive a post-op/follow-up appointment, make sure   that you call for this appointment within a day or two after you arrive home to insure a convenient appointment time.  OTHER INSTRUCTIONS  WHEN TO CALL YOUR DOCTOR: Fever over 101.0 Inability to urinate Continued bleeding from incision. Increased pain, redness, or  drainage from the incision. Increasing abdominal pain  The clinic staff is available to answer your questions during regular business hours.  Please don't hesitate to call and ask to speak to one of the nurses for clinical concerns.  If you have a medical emergency, go to the nearest emergency room or call 911.  A surgeon from [facility] is always on call at the hospital. [address], [address], [place], [place]  [address] ? [address] [address], ,    [address] [phone] ? [phone] ? FAX [phone]   

## 2022-01-19 NOTE — Op Note (Signed)
LAPAROSCOPIC CHOLECYSTECTOMY  Procedure Note  AJANI RINEER 01/10/2022 - 01/19/2022   Pre-op Diagnosis: CHOLECYSTITIS WITH CHOLELITHIASIS AND HISTORY OF CHOLEDOLCHOLITHIASIS     Post-op Diagnosis: SAME  Procedure(s): LAPAROSCOPIC CHOLECYSTECTOMY  Surgeon(s): Coralie Keens, MD Ileana Roup, MD  Anesthesia: General  Staff:  Circulator: Launa Grill, RN Relief Circulator: Gilda Crease, RN Scrub Person: Sanda Klein; Lois Huxley  Estimated Blood Loss: Minimal               Specimens: sent to pathology  Indications: This is a 66- year-old gentleman who presented with choledocholithiasis and cholangitis.  He also has a history of stage IV lung cancer.  He has been undergoing chemotherapy.  He underwent ERCP with stone removal and is also has a Port-A-Cath removed.  He now presents for laparoscopic cholecystectomy  Procedure: The patient is brought to operating identifies correct patient.  He is placed upon the operating table general anesthesia was induced.  His abdomen was then prepped and draped in usual sterile fashion.  I made a small incision in the patient's left upper quadrant with a scalpel.  I then used the 5 mm trocar and Optiview camera to slowly traversed all levels of the abdominal wall in an attempt to gain access into the abdominal cavity.  The patient was found to have dense adhesions in the upper quadrant.  I cannot create a large enough area to proceed further laparoscopically in this area.  We saw no evidence of bowel injury.  We were, however, able to create enough pneumoperitoneum that we were able to place a trocar with Optiview camera in the patient's right upper quadrant.  We examined the wire site and saw no evidence of bowel injury.  The patient had extensive adhesions of omentum to his previous incisional hernia repair with mesh.  We ready placed a 5 mm trocar in the patient's right upper quadrant and another in the epigastrium under right  vision.  I was then able to place a 12 mm trocar in the patient's right right mid abdomen just lateral superior to his mesh under direct vision.  The gallbladder was found to be thick-walled and inflamed.  We were able to retract the gallbladder above the liver.  I then was able to dissect out the cystic duct and achieved a critical window around it.  I also identified the cystic artery and achieved a window around it as well.  I clipped both proximally and distally and transected each.  I then slowly dissected the gallbladder free from the liver bed with the cautery.  Once it was freed from liver bed hemostasis to be achieved with the cautery.  We placed the gallbladder and ECCO sac and were then able to remove it through the 12 mm trocar site.  We then irrigated the abdomen saline.  Hemostasis peer to be achieved.  All ports then removed under direct vision and the abdomen was deflated.  I closed the fascia at the old millimeter trocar site with several figure-of-eight 0 Vicryl sutures.  All incisions were then anesthetized Marcaine and closed with 4-0 Monocryl sutures and Dermabond.  The patient tolerated the procedure well.  All counts were correct at the end of the procedure.  The patient was then extubated in the operating room and taken in stable condition to the recovery room.          Coralie Keens   Date: 01/19/2022  Time: 1:43 PM

## 2022-01-19 NOTE — Assessment & Plan Note (Addendum)
Not actively being treated Has followed with [facility] in the outpatient setting.

## 2022-01-19 NOTE — Assessment & Plan Note (Signed)
No clinical evidence of cardiogenic volume overload Strict input and output monitoring Daily weights Low-sodium diet

## 2022-01-19 NOTE — Transfer of Care (Signed)
Immediate Anesthesia Transfer of Care Note  Patient: [name] [name]  Procedure(s) Performed: Procedure(s): LAPAROSCOPIC CHOLECYSTECTOMY (N/A)  Patient Location: PACU  Anesthesia Type:General  Level of Consciousness: Patient easily awoken, sedated, comfortable, cooperative, following commands, responds to stimulation.   Airway & Oxygen Therapy: Patient spontaneously breathing, ventilating well, oxygen via simple oxygen mask.  Post-op Assessment: Report given to PACU RN, vital signs reviewed and stable, moving all extremities.   Post vital signs: Reviewed and stable.  Complications: No apparent anesthesia complications  Last Vitals:  Vitals Value Taken Time  BP 160/78 01/19/22 1352  Temp    Pulse 62 01/19/22 1355  Resp 14 01/19/22 1355  SpO2 100 % 01/19/22 1355  Vitals shown include unvalidated device data.  Last Pain:  Vitals:   01/19/22 1010  TempSrc:   PainSc: 0-No pain      Patients Stated Pain Goal: 2 (74/12/87 8676)  Complications: No notable events documented.

## 2022-01-19 NOTE — Progress Notes (Signed)
PROGRESS NOTE   [name] [name]  UGQ:[id number] DOB: Jul 25, 1955 DOA: 01/10/2022 PCP: [name], MD   Date of Service: the patient was seen and examined on 01/19/2022  Brief Narrative:  66 y.o. male with a history of stage IV non-small cell lung cancer, coronary artery disease, diastolic congestive heart failure, depression, anxiety, chronic pain, osteoarthritis, COPD, hyperlipidemia, fibromyalgia, GERD, migraine, hypertension, obesity.  Patient has a pertinent additional history of choledocholithiasis with a history of prior ERCP and sphincterotomy 02/2021.    Patient presented secondary to abdominal pain, nausea and vomiting with evidence of a CBD stone.   Patient met sepsis criteria on admission and was found to have an E. Coli and Enterococcus casseliflavus bacteremia.  TEE was performed and was negative.  Intravenous antibiotics were initiated initially with intravenous ceftriaxone and vancomycin with regimen adjusted to Unasyn monotherapy due to concerns for cholangitis with infectious disease following.   Gastroenterology was additionally consulted and patient underwent another ERCP on 11/9 with sphincterotomy and CBD stone extraction.  Due to this being the patient's second ERCP with stone extraction Dr. Ninfa Linden with general surgery was consulted patient undergoing laparoscopic cholecystectomy on 11/15.   Assessment and Plan: * Acute cholecystitis Status post laparoscopic cholecystectomy earlier today without complication Advancing diet as tolerated Infectious disease recommends 3 days of additional intravenous Unasyn status post laparoscopic cholecystectomy through 11/18 Patient will then need to be transition to high-dose amoxicillin through 11/24 to complete a total of 2 weeks of antibiotic therapy for bacteremia status post removal of Port-A-Cath  Choledocholithiasis with obstruction Status post ERCP with stone extraction 11/9 Remainder of assessment and plan as  above  Cholangitis Treatment with intravenous and oral antibiotic therapy as noted above for associated bacteremia and sepsis  Sepsis due to Gram negative bacteria ([place]) Please see assessment and plan above  Gram-negative bacteremia E. coli and Enterococcus Subclavius bacteremia Treating with total of 2 weeks of antibiotic therapy through 11/24 per infectious disease recommendations Remainder of assessment and plan as above  CAD S/P percutaneous coronary angioplasty Patient is currently chest pain-free  Chronic diastolic CHF (congestive heart failure) ([place]) No clinical evidence of cardiogenic volume overload Strict input and output monitoring Daily weights Low-sodium diet   COPD (chronic obstructive pulmonary disease) (HCC) No evidence of COPD exacerbation this time Continue home regimen of maintenance inhalers As needed bronchodilator therapy for episodic shortness of breath and wheezing.   Non-small cell carcinoma of right lung, stage 4 (HCC) Not actively being treated Has followed with [facility] in the outpatient setting.     Subjective:  Patient complains of generalized abdominal pain, moderate in intensity, epigastric in location, sharp in quality.  Patient has tolerated his clear liquid diet status post his laparoscopic cholecystectomy earlier today.  Physical Exam:  Vitals:   01/19/22 1549 01/19/22 1658 01/19/22 1818 01/19/22 2050  BP: (!) 161/88 (!) 174/84 131/70 (!) 141/76  Pulse: 67 73 74 75  Resp: _0 Temp: 97.8 F (36.6 C)  98.2 F (36.8 C) 98.2 F (36.8 C)  TempSrc: Oral  Oral Oral  SpO2: 96% 94% 98% 97%  Weight:      Height:        Constitutional: Awake alert and oriented x3, no associated distress.   Skin: Laparoscopic wounds are all closed without any evidence of drainage or redness or induration. Eyes: Pupils are equally reactive to light.  No evidence of scleral icterus or conjunctival pallor.  ENMT: Moist mucous membranes noted.   Posterior pharynx  clear of any exudate or lesions.   Respiratory: clear to auscultation bilaterally, no wheezing, no crackles. Normal respiratory effort. No accessory muscle use.  Cardiovascular: Regular rate and rhythm, no murmurs / rubs / gallops. No extremity edema. 2+ pedal pulses. No carotid bruits.  Abdomen: Generalized abdominal tenderness, worst in the epigastric region.  Abdomen is soft.  Hypoactive bowel sounds noted.  No organomegaly appreciated. Musculoskeletal: No joint deformity upper and lower extremities. Good ROM, no contractures. Normal muscle tone.    Data Reviewed:  I have personally reviewed and interpreted labs, imaging.  Significant findings are   CBC: Recent Labs  Lab 01/15/22 0327 01/16/22 0626 01/17/22 0634 01/18/22 0538 01/19/22 1520  WBC 9.1 6.1 5.4 5.1 6.9  NEUTROABS 7.6 3.7 2.8 2.5 6.0  HGB 11.4* 11.3* 11.5* 12.2* 11.4*  HCT 34.7* 35.6* 36.1* 37.5* 36.2*  MCV 96.9 98.3 98.9 99.7 101.7*  PLT 145* 173 200 227 720   Basic Metabolic Panel: Recent Labs  Lab 01/14/22 0333 01/16/22 0626 01/17/22 0634 01/18/22 0538 01/19/22 0525  NA 140 144 141 141 140  K 4.0 4.4 3.1* 3.4* 3.6  CL 109 109 106 105 110  CO2 _0 GLUCOSE 175* 105* 113* 105* 102*  BUN _1 CREATININE 0.98 1.03 1.01 0.96 0.97  CALCIUM 8.2* 8.4* 8.2* 8.4* 8.2*   GFR: Estimated Creatinine Clearance: 93.5 mL/min (by C-G formula based on SCr of 0.97 mg/dL). Liver Function Tests: Recent Labs  Lab 01/14/22 0333 01/16/22 0626 01/17/22 0634 01/18/22 0538 01/19/22 0525  AST 35 110* 77* 80* 68*  ALT 86* 157* 142* 149* 133*  ALKPHOS 88 74 74 72 64  BILITOT 1.5* 1.1 1.0 1.0 0.9  PROT 6.1* 5.7* 5.8* 6.1* 5.7*  ALBUMIN 2.6* 2.6* 2.8* 2.7* 3.0*    Coagulation Profile: Recent Labs  Lab 01/13/22 0345  INR 1.1     Code Status:  Full code.  Code status decision has been confirmed with: patient Family Communication: deferred    Severity of Illness:  The  appropriate patient status for this patient is INPATIENT. Inpatient status is judged to be reasonable and necessary in order to provide the required intensity of service to ensure the patient's safety. The patient's presenting symptoms, physical exam findings, and initial radiographic and laboratory data in the context of their chronic comorbidities is felt to place them at high risk for further clinical deterioration. Furthermore, it is not anticipated that the patient will be medically stable for discharge from the hospital within 2 midnights of admission.   * I certify that at the point of admission it is my clinical judgment that the patient will require inpatient hospital care spanning beyond 2 midnights from the point of admission due to high intensity of service, high risk for further deterioration and high frequency of surveillance required.*  Time spent:  61 minutes  Author:  Vernelle Emerald MD  01/19/2022 10:21 PM

## 2022-01-19 NOTE — Anesthesia Postprocedure Evaluation (Signed)
Anesthesia Post Note  Patient: [name] [name]  Procedure(s) Performed: LAPAROSCOPIC CHOLECYSTECTOMY     Patient location during evaluation: PACU Anesthesia Type: General Level of consciousness: awake Pain management: pain level controlled Vital Signs Assessment: post-procedure vital signs reviewed and stable Respiratory status: spontaneous breathing, nonlabored ventilation, respiratory function stable and patient connected to nasal cannula oxygen Cardiovascular status: blood pressure returned to baseline and stable Postop Assessment: no apparent nausea or vomiting Anesthetic complications: no   No notable events documented.  Last Vitals:  Vitals:   01/19/22 1515 01/19/22 1549  BP:  (!) 161/88  Pulse: 71 67  Resp: (!) 21 18  Temp:  36.6 C  SpO2: 98% 96%    Last Pain:  Vitals:   01/19/22 1549  TempSrc: Oral  PainSc:                  Dewel Lotter P Jadon Harbaugh

## 2022-01-19 NOTE — Assessment & Plan Note (Signed)
E. coli and Enterococcus Subclavius bacteremia Treating with total of 2 weeks of antibiotic therapy through 11/24 per infectious disease recommendations Remainder of assessment and plan as above

## 2022-01-19 NOTE — Assessment & Plan Note (Signed)
Patient is currently chest pain-free

## 2022-01-19 NOTE — Assessment & Plan Note (Addendum)
Status post ERCP with stone extraction 11/9 Remainder of assessment and plan as above

## 2022-01-19 NOTE — Assessment & Plan Note (Signed)
   No evidence of COPD exacerbation this time  Continue home regimen of maintenance inhalers  As needed bronchodilator therapy for episodic shortness of breath and wheezing.  

## 2022-01-19 NOTE — Anesthesia Preprocedure Evaluation (Addendum)
Anesthesia Evaluation  Patient identified by MRN, date of birth, ID band Patient awake    Reviewed: Allergy & Precautions, NPO status , Patient's Chart, lab work & pertinent test results  Airway Mallampati: III  TM Distance: >3 FB Neck ROM: Full    Dental  (+) Edentulous Upper, Poor Dentition, Missing   Pulmonary COPD,  COPD inhaler, former smoker   Pulmonary exam normal        Cardiovascular hypertension, + CAD, + Past MI and + Cardiac Stents  Normal cardiovascular exam     Neuro/Psych  Headaches PSYCHIATRIC DISORDERS Anxiety Depression       GI/Hepatic negative GI ROS,,,(+)     substance abuse    Endo/Other  negative endocrine ROS    Renal/GU negative Renal ROS     Musculoskeletal  (+) Arthritis ,  Fibromyalgia -, narcotic dependentGout   Abdominal   Peds  Hematology  (+) Blood dyscrasia, anemia   Anesthesia Other Findings CHOLEDOCHOLITHIASIS  Reproductive/Obstetrics                              Anesthesia Physical Anesthesia Plan  ASA: 3  Anesthesia Plan: General   Post-op Pain Management:    Induction: Intravenous  PONV Risk Score and Plan: 3 and Ondansetron, Dexamethasone, Midazolam and Treatment may vary due to age or medical condition  Airway Management Planned: Oral ETT  Additional Equipment:   Intra-op Plan:   Post-operative Plan: Extubation in OR  Informed Consent: I have reviewed the patients History and Physical, chart, labs and discussed the procedure including the risks, benefits and alternatives for the proposed anesthesia with the patient or authorized representative who has indicated his/her understanding and acceptance.     Dental advisory given  Plan Discussed with: CRNA  Anesthesia Plan Comments:         Anesthesia Quick Evaluation

## 2022-01-19 NOTE — Assessment & Plan Note (Signed)
·   Please see assessment and plan above °

## 2022-01-20 ENCOUNTER — Encounter (HOSPITAL_COMMUNITY): Payer: Self-pay | Admitting: Surgery

## 2022-01-20 DIAGNOSIS — K810 Acute cholecystitis: Secondary | ICD-10-CM | POA: Diagnosis not present

## 2022-01-20 DIAGNOSIS — I5032 Chronic diastolic (congestive) heart failure: Secondary | ICD-10-CM | POA: Diagnosis not present

## 2022-01-20 DIAGNOSIS — I2510 Atherosclerotic heart disease of native coronary artery without angina pectoris: Secondary | ICD-10-CM | POA: Diagnosis not present

## 2022-01-20 DIAGNOSIS — K8043 Calculus of bile duct with acute cholecystitis with obstruction: Secondary | ICD-10-CM | POA: Diagnosis not present

## 2022-01-20 LAB — CBC WITH DIFFERENTIAL/PLATELET
Abs Immature Granulocytes: 0.03 10*3/uL (ref 0.00–0.07)
Basophils Absolute: 0 10*3/uL (ref 0.0–0.1)
Basophils Relative: 0 %
Eosinophils Absolute: 0 10*3/uL (ref 0.0–0.5)
Eosinophils Relative: 0 %
HCT: 39.9 % (ref 39.0–52.0)
Hemoglobin: 12.7 g/dL — ABNORMAL LOW (ref 13.0–17.0)
Immature Granulocytes: 0 %
Lymphocytes Relative: 12 %
Lymphs Abs: 1 10*3/uL (ref 0.7–4.0)
MCH: 32.1 pg (ref 26.0–34.0)
MCHC: 31.8 g/dL (ref 30.0–36.0)
MCV: 100.8 fL — ABNORMAL HIGH (ref 80.0–100.0)
Monocytes Absolute: 0.5 10*3/uL (ref 0.1–1.0)
Monocytes Relative: 6 %
Neutro Abs: 6.7 10*3/uL (ref 1.7–7.7)
Neutrophils Relative %: 82 %
Platelets: 304 10*3/uL (ref 150–400)
RBC: 3.96 MIL/uL — ABNORMAL LOW (ref 4.22–5.81)
RDW: 14.6 % (ref 11.5–15.5)
WBC: 8.2 10*3/uL (ref 4.0–10.5)
nRBC: 0 % (ref 0.0–0.2)

## 2022-01-20 LAB — MISC LABCORP TEST (SEND OUT): Labcorp test code: 182261

## 2022-01-20 LAB — COMPREHENSIVE METABOLIC PANEL
ALT: 154 U/L — ABNORMAL HIGH (ref 0–44)
AST: 82 U/L — ABNORMAL HIGH (ref 15–41)
Albumin: 3.3 g/dL — ABNORMAL LOW (ref 3.5–5.0)
Alkaline Phosphatase: 74 U/L (ref 38–126)
Anion gap: 6 (ref 5–15)
BUN: 10 mg/dL (ref 8–23)
CO2: 27 mmol/L (ref 22–32)
Calcium: 8.8 mg/dL — ABNORMAL LOW (ref 8.9–10.3)
Chloride: 108 mmol/L (ref 98–111)
Creatinine, Ser: 0.97 mg/dL (ref 0.61–1.24)
GFR, Estimated: >60 mL/min (ref >60)
Glucose, Bld: 136 mg/dL — ABNORMAL HIGH (ref 70–99)
Potassium: 4.3 mmol/L (ref 3.5–5.1)
Sodium: 141 mmol/L (ref 135–145)
Total Bilirubin: 1.3 mg/dL — ABNORMAL HIGH (ref 0.3–1.2)
Total Protein: 6.7 g/dL (ref 6.5–8.1)

## 2022-01-20 LAB — MAGNESIUM: Magnesium: 2.1 mg/dL (ref 1.7–2.4)

## 2022-01-20 LAB — SURGICAL PATHOLOGY

## 2022-01-20 NOTE — Care Management Important Message (Signed)
Important Message  Patient Details IM Letter given Name: MCCORMICK MACON MRN: [id number] Date of Birth: 1956/02/09   Medicare Important Message Given:  Yes     Kerin Salen 01/20/2022, 10:24 AM

## 2022-01-20 NOTE — Progress Notes (Signed)
PROGRESS NOTE   [name] [name]  OBS:[id number] DOB: [birth year] DOA: 01/10/2022 PCP: [name], MD   Date of Service: the patient was seen and examined on 01/20/2022  Brief Narrative:  66 y.o. male with a history of stage IV non-small cell lung cancer, coronary artery disease, diastolic congestive heart failure, depression, anxiety, chronic pain, osteoarthritis, COPD, hyperlipidemia, fibromyalgia, GERD, migraine, hypertension, obesity.  Patient has a pertinent additional history of choledocholithiasis with a history of prior ERCP and sphincterotomy 02/2021.    Patient presented secondary to abdominal pain, nausea and vomiting with evidence of a CBD stone.   Patient met sepsis criteria on admission and was found to have an E. Coli and Enterococcus casseliflavus bacteremia.  Dr. Juleen China with infectious disease was consulted.  Patient's Port-A-Cath was removed on 11/10.  TEE was performed and was negative.  Intravenous antibiotics were initiated initially with intravenous ceftriaxone and vancomycin with regimen adjusted to Unasyn monotherapy due to concerns for cholangitis.    Gastroenterology was additionally consulted and patient underwent another ERCP on 11/9 with sphincterotomy and CBD stone extraction.  Due to this being the patient's second ERCP with stone extraction Dr. Ninfa Linden with general surgery was consulted patient undergoing successful laparoscopic cholecystectomy on 11/15 without postoperative complication.  Per infectious disease recommendations, patient was continued to be treated with intravenous antibiotic therapy through 11/18 followed by oral high-dose amoxicillin to complete a 2-week course of treatment since Port-A-Cath removal.    Assessment and Plan: * Acute cholecystitis Status post laparoscopic cholecystectomy 47/65 without complication Advancing diet as tolerated Infectious disease recommends 3 days of additional intravenous Unasyn status post laparoscopic  cholecystectomy through 11/18 Patient will then need to be transition to high-dose amoxicillin through 11/24 to complete a total of 2 weeks of antibiotic therapy for bacteremia status post removal of Port-A-Cath  Choledocholithiasis with obstruction Status post ERCP with stone extraction 11/9 Remainder of assessment and plan as above  Cholangitis Treatment with intravenous and oral antibiotic therapy as noted above for associated bacteremia and sepsis  Sepsis due to Gram negative bacteria ([place]) Please see assessment and plan above  Gram-negative bacteremia E. coli and Enterococcus Subclavius bacteremia Treating with total of 2 weeks of antibiotic therapy through 11/24 per infectious disease recommendations Remainder of assessment and plan as above  CAD S/P percutaneous coronary angioplasty Patient is currently chest pain-free  Chronic diastolic CHF (congestive heart failure) ([place]) No clinical evidence of cardiogenic volume overload Strict input and output monitoring Daily weights Low-sodium diet   COPD (chronic obstructive pulmonary disease) ([place]) No evidence of COPD exacerbation this time Continue home regimen of maintenance inhalers As needed bronchodilator therapy for episodic shortness of breath and wheezing.   Non-small cell carcinoma of right lung, stage 4 (HCC) Not actively being treated Has followed with [facility] in the outpatient setting.     Subjective:  Patient reports generalized abdominal pain.  Mild in intensity, worse in the epigastric region.  Patient reports that his abdominal pain is improved since yesterday.  Abdominal pain is worse with movement and improved with rest.  Patient's appetite is additionally improving.    Physical Exam:  Vitals:   01/19/22 1818 01/19/22 2050 01/20/22 0353 01/20/22 1400  BP: 131/70 (!) 141/76 137/70 (!) 108/54  Pulse: 74 75 71 84  Resp: _0 Temp: 98.2 F (36.8 C) 98.2 F (36.8 C) 98.4 F (36.9 C) 97.7 F  (36.5 C)  TempSrc: Oral Oral Oral Oral  SpO2: 98% 97% 95% 99%  Weight:  Height:        Constitutional: Awake alert and oriented x3, no associated distress.   Skin: Laparoscopic wounds are all closed without any evidence of drainage or redness or induration. Eyes: Pupils are equally reactive to light.  No evidence of scleral icterus or conjunctival pallor.  ENMT: Moist mucous membranes noted.  Posterior pharynx clear of any exudate or lesions.   Respiratory: clear to auscultation bilaterally, no wheezing, no crackles. Normal respiratory effort. No accessory muscle use.  Cardiovascular: Regular rate and rhythm, no murmurs / rubs / gallops. No extremity edema. 2+ pedal pulses. No carotid bruits.  Abdomen: Generalized abdominal tenderness, worst in the epigastric region.  Abdomen is soft.  Hypoactive bowel sounds noted.  No organomegaly appreciated. Musculoskeletal: No joint deformity upper and lower extremities. Good ROM, no contractures. Normal muscle tone.    Data Reviewed:  I have personally reviewed and interpreted labs, imaging.  Significant findings are   CBC: Recent Labs  Lab 01/16/22 0626 01/17/22 0634 01/18/22 0538 01/19/22 1520 01/20/22 0603  WBC 6.1 5.4 5.1 6.9 8.2  NEUTROABS 3.7 2.8 2.5 6.0 6.7  HGB 11.3* 11.5* 12.2* 11.4* 12.7*  HCT 35.6* 36.1* 37.5* 36.2* 39.9  MCV 98.3 98.9 99.7 101.7* 100.8*  PLT 173 200 227 240 454    Basic Metabolic Panel: Recent Labs  Lab 01/16/22 0626 01/17/22 0634 01/18/22 0538 01/19/22 0525 01/20/22 0603  NA 144 141 141 140 141  K 4.4 3.1* 3.4* 3.6 4.3  CL 109 106 105 110 108  CO2 _0 GLUCOSE 105* 113* 105* 102* 136*  BUN _1 CREATININE 1.03 1.01 0.96 0.97 0.97  CALCIUM 8.4* 8.2* 8.4* 8.2* 8.8*  MG  --   --   --   --  2.1    GFR: Estimated Creatinine Clearance: 93.5 mL/min (by C-G formula based on SCr of 0.97 mg/dL). Liver Function Tests: Recent Labs  Lab 01/16/22 0626 01/17/22 0634  01/18/22 0538 01/19/22 0525 01/20/22 0603  AST 110* 77* 80* 68* 82*  ALT 157* 142* 149* 133* 154*  ALKPHOS 74 74 72 64 74  BILITOT 1.1 1.0 1.0 0.9 1.3*  PROT 5.7* 5.8* 6.1* 5.7* 6.7  ALBUMIN 2.6* 2.8* 2.7* 3.0* 3.3*    Code Status:  Full code.  Code status decision has been confirmed with: patient Family Communication: deferred    Severity of Illness:  The appropriate patient status for this patient is INPATIENT. Inpatient status is judged to be reasonable and necessary in order to provide the required intensity of service to ensure the patient's safety. The patient's presenting symptoms, physical exam findings, and initial radiographic and laboratory data in the context of their chronic comorbidities is felt to place them at high risk for further clinical deterioration. Furthermore, it is not anticipated that the patient will be medically stable for discharge from the hospital within 2 midnights of admission.   * I certify that at the point of admission it is my clinical judgment that the patient will require inpatient hospital care spanning beyond 2 midnights from the point of admission due to high intensity of service, high risk for further deterioration and high frequency of surveillance required.*  Time spent:  40 minutes  Author:  Vernelle Emerald MD  01/20/2022 7:42 PM

## 2022-01-20 NOTE — Progress Notes (Signed)
Mobility Specialist - Progress Note   01/20/22 1009  Mobility  Activity Ambulated with assistance in hallway  Level of Assistance Standby assist, set-up cues, supervision of patient - no hands on  Assistive Device Front wheel walker  Distance Ambulated (ft) 250 ft  Activity Response Tolerated well  Mobility Referral Yes  $Mobility charge 1 Mobility   Pt received in bed and agreed to mobility, had pain when transitioning to sit to stand. Discomfort in iv placed in hand, nurse made aware. Pt returned to bed with all needs met and staff in room.   Roderick Pee Mobility Specialist

## 2022-01-20 NOTE — Progress Notes (Signed)
1 Day Post-Op   Subjective/Chief Complaint: Feeling better this morning post op Pain controlled Denies nausea, tolerating po   Objective: Vital signs in last 24 hours: Temp:  [97.3 F (36.3 C)-98.4 F (36.9 C)] 98.4 F (36.9 C) (11/16 0353) Pulse Rate:  [60-75] 71 (11/16 0353) Resp:  [10-21] 18 (11/16 0353) BP: (104-174)/(62-90) 137/70 (11/16 0353) SpO2:  [92 %-100 %] 95 % (11/16 0353) Last BM Date : 01/17/22  Intake/Output from previous day: 11/15 0701 - 11/16 0700 In: 1915 [P.O.:15; I.V.:1000; IV Piggyback:900] Out: 1725 [Urine:1700; Blood:25] Intake/Output this shift: No intake/output data recorded.  Exam: Awake and alert Abdomen soft, minimally tender, incisions clean  Lab Results:  Recent Labs    01/19/22 1520 01/20/22 0603  WBC 6.9 8.2  HGB 11.4* 12.7*  HCT 36.2* 39.9  PLT 240 304   BMET Recent Labs    01/19/22 0525 01/20/22 0603  NA 140 141  K 3.6 4.3  CL 110 108  CO2 26 27  GLUCOSE 102* 136*  BUN 12 10  CREATININE 0.97 0.97  CALCIUM 8.2* 8.8*   PT/INR No results for input(s): "LABPROT", "INR" in the last 72 hours. ABG No results for input(s): "PHART", "HCO3" in the last 72 hours.  Invalid input(s): "PCO2", "PO2"  Studies/Results: No results found.  Anti-infectives: Anti-infectives (From admission, onward)    Start     Dose/Rate Route Frequency Ordered Stop   01/23/22 0600  amoxicillin (AMOXIL) capsule 1,000 mg        1,000 mg Oral Every 8 hours 01/18/22 1126 01/29/22 0559   01/13/22 1400  Ampicillin-Sulbactam (UNASYN) 3 g in sodium chloride 0.9 % 100 mL IVPB        3 g 200 mL/hr over 30 Minutes Intravenous Every 6 hours 01/13/22 1018 01/22/22 2359   01/12/22 1500  piperacillin-tazobactam (ZOSYN) IVPB 3.375 g  Status:  Discontinued        3.375 g 12.5 mL/hr over 240 Minutes Intravenous Every 8 hours 01/12/22 1337 01/13/22 1018   01/11/22 1400  vancomycin (VANCOREADY) IVPB 1500 mg/300 mL  Status:  Discontinued        1,500 mg 150  mL/hr over 120 Minutes Intravenous Every 24 hours 01/10/22 1611 01/11/22 0533   01/11/22 1300  cefTRIAXone (ROCEPHIN) 2 g in sodium chloride 0.9 % 100 mL IVPB  Status:  Discontinued        2 g 200 mL/hr over 30 Minutes Intravenous Every 24 hours 01/11/22 0533 01/12/22 1337   01/11/22 1300  vancomycin (VANCOREADY) IVPB 1250 mg/250 mL  Status:  Discontinued        1,250 mg 166.7 mL/hr over 90 Minutes Intravenous Every 24 hours 01/11/22 0922 01/12/22 1337   01/10/22 2200  ceFEPIme (MAXIPIME) 2 g in sodium chloride 0.9 % 100 mL IVPB  Status:  Discontinued        2 g 200 mL/hr over 30 Minutes Intravenous Every 8 hours 01/10/22 1521 01/11/22 0533   01/10/22 2200  metroNIDAZOLE (FLAGYL) IVPB 500 mg  Status:  Discontinued        500 mg 100 mL/hr over 60 Minutes Intravenous Every 12 hours 01/10/22 1521 01/11/22 0824   01/10/22 1230  ceFEPIme (MAXIPIME) 2 g in sodium chloride 0.9 % 100 mL IVPB        2 g 200 mL/hr over 30 Minutes Intravenous  Once 01/10/22 1221 01/10/22 1324   01/10/22 1230  metroNIDAZOLE (FLAGYL) IVPB 500 mg        500 mg 100 mL/hr over  60 Minutes Intravenous  Once 01/10/22 1221 01/10/22 1325   01/10/22 1230  vancomycin (VANCOCIN) IVPB 1000 mg/200 mL premix  Status:  Discontinued        1,000 mg 200 mL/hr over 60 Minutes Intravenous  Once 01/10/22 1221 01/10/22 1228   01/10/22 1230  vancomycin (VANCOREADY) IVPB 2000 mg/400 mL        2,000 mg 200 mL/hr over 120 Minutes Intravenous  Once 01/10/22 1228 01/10/22 1537       Assessment/Plan: E. Coli bacteremia Enterococcus casseliflavus bacteremia Cholelithiasis, choledocholithiasis, possible cholangitis   - CT 11/6 with choledocholithiasis - RUQ [place] 11/6 without cholecystitis or definitive choledocholithiasis - S/P ERCP 11/9: Choledocholithiasis found, extension sphincterotomy and common bile duct stone extraction, filling of the biliary tree and cystic duct but not the gallbladder, erosive duodenitis - LFTs stable, no  leukocytosis, no PO intolerance - port removed 11/10, on IV abx  -POD#1 s/p Lap chole  Advance diet as tolerated Ambulate Ok for discharge later today or tomorrow from a surgical standpoint  Coralie Keens MD 01/20/2022

## 2022-01-20 NOTE — Progress Notes (Incomplete)
[facility]

## 2022-01-21 DIAGNOSIS — K810 Acute cholecystitis: Secondary | ICD-10-CM | POA: Diagnosis not present

## 2022-01-21 DIAGNOSIS — K8043 Calculus of bile duct with acute cholecystitis with obstruction: Secondary | ICD-10-CM | POA: Diagnosis not present

## 2022-01-21 DIAGNOSIS — A4150 Gram-negative sepsis, unspecified: Secondary | ICD-10-CM | POA: Diagnosis not present

## 2022-01-21 DIAGNOSIS — K8309 Other cholangitis: Secondary | ICD-10-CM | POA: Diagnosis not present

## 2022-01-21 LAB — CBC WITH DIFFERENTIAL/PLATELET
Abs Immature Granulocytes: 0.04 10*3/uL (ref 0.00–0.07)
Basophils Absolute: 0 10*3/uL (ref 0.0–0.1)
Basophils Relative: 0 %
Eosinophils Absolute: 0 10*3/uL (ref 0.0–0.5)
Eosinophils Relative: 1 %
HCT: 36.1 % — ABNORMAL LOW (ref 39.0–52.0)
Hemoglobin: 11.4 g/dL — ABNORMAL LOW (ref 13.0–17.0)
Immature Granulocytes: 1 %
Lymphocytes Relative: 33 %
Lymphs Abs: 1.8 10*3/uL (ref 0.7–4.0)
MCH: 31.8 pg (ref 26.0–34.0)
MCHC: 31.6 g/dL (ref 30.0–36.0)
MCV: 100.6 fL — ABNORMAL HIGH (ref 80.0–100.0)
Monocytes Absolute: 0.6 10*3/uL (ref 0.1–1.0)
Monocytes Relative: 11 %
Neutro Abs: 2.9 10*3/uL (ref 1.7–7.7)
Neutrophils Relative %: 54 %
Platelets: 258 10*3/uL (ref 150–400)
RBC: 3.59 MIL/uL — ABNORMAL LOW (ref 4.22–5.81)
RDW: 14.9 % (ref 11.5–15.5)
WBC: 5.4 10*3/uL (ref 4.0–10.5)
nRBC: 0 % (ref 0.0–0.2)

## 2022-01-21 LAB — COMPREHENSIVE METABOLIC PANEL
ALT: 131 U/L — ABNORMAL HIGH (ref 0–44)
AST: 66 U/L — ABNORMAL HIGH (ref 15–41)
Albumin: 2.8 g/dL — ABNORMAL LOW (ref 3.5–5.0)
Alkaline Phosphatase: 61 U/L (ref 38–126)
Anion gap: 6 (ref 5–15)
BUN: 12 mg/dL (ref 8–23)
CO2: 27 mmol/L (ref 22–32)
Calcium: 8.1 mg/dL — ABNORMAL LOW (ref 8.9–10.3)
Chloride: 106 mmol/L (ref 98–111)
Creatinine, Ser: 0.9 mg/dL (ref 0.61–1.24)
GFR, Estimated: >60 mL/min (ref >60)
Glucose, Bld: 94 mg/dL (ref 70–99)
Potassium: 3.4 mmol/L — ABNORMAL LOW (ref 3.5–5.1)
Sodium: 139 mmol/L (ref 135–145)
Total Bilirubin: 0.8 mg/dL (ref 0.3–1.2)
Total Protein: 5.8 g/dL — ABNORMAL LOW (ref 6.5–8.1)

## 2022-01-21 LAB — CULTURE, BLOOD (ROUTINE X 2): Special Requests: ADEQUATE

## 2022-01-21 MED ORDER — POTASSIUM CHLORIDE CRYS ER 20 MEQ PO TBCR
40.0000 meq | EXTENDED_RELEASE_TABLET | Freq: Once | ORAL | Status: AC
  Administered 2022-01-21: 40 meq via ORAL
  Filled 2022-01-21: qty 2

## 2022-01-21 NOTE — Progress Notes (Signed)
2 Days Post-Op   Subjective/Chief Complaint: No complaints Minimal pain and tolerating po   Objective: Vital signs in last 24 hours: Temp:  [97.7 F (36.5 C)-98.6 F (37 C)] 98.5 F (36.9 C) (11/17 0636) Pulse Rate:  [64-84] 64 (11/17 0636) Resp:  [18-20] 18 (11/17 0636) BP: (108-147)/(54-70) 141/68 (11/17 0636) SpO2:  [98 %-99 %] 99 % (11/17 0636) Weight:  [110.8 kg] 110.8 kg (11/17 0636) Last BM Date : 01/20/22  Intake/Output from previous day: 11/16 0701 - 11/17 0700 In: 1180 [P.O.:480; IV Piggyback:700] Out: 2100 [Urine:2100] Intake/Output this shift: No intake/output data recorded.  Exam: Awake and alert Comfortable Abdomen soft, obese, incisions look good  Lab Results:  Recent Labs    01/20/22 0603 01/21/22 0544  WBC 8.2 5.4  HGB 12.7* 11.4*  HCT 39.9 36.1*  PLT 304 258   BMET Recent Labs    01/20/22 0603 01/21/22 0544  NA 141 139  K 4.3 3.4*  CL 108 106  CO2 27 27  GLUCOSE 136* 94  BUN 10 12  CREATININE 0.97 0.90  CALCIUM 8.8* 8.1*   PT/INR No results for input(s): "LABPROT", "INR" in the last 72 hours. ABG No results for input(s): "PHART", "HCO3" in the last 72 hours.  Invalid input(s): "PCO2", "PO2"  Studies/Results: No results found.  Anti-infectives: Anti-infectives (From admission, onward)    Start     Dose/Rate Route Frequency Ordered Stop   01/23/22 0600  amoxicillin (AMOXIL) capsule 1,000 mg        1,000 mg Oral Every 8 hours 01/18/22 1126 01/29/22 0559   01/13/22 1400  Ampicillin-Sulbactam (UNASYN) 3 g in sodium chloride 0.9 % 100 mL IVPB        3 g 200 mL/hr over 30 Minutes Intravenous Every 6 hours 01/13/22 1018 01/22/22 2359   01/12/22 1500  piperacillin-tazobactam (ZOSYN) IVPB 3.375 g  Status:  Discontinued        3.375 g 12.5 mL/hr over 240 Minutes Intravenous Every 8 hours 01/12/22 1337 01/13/22 1018   01/11/22 1400  vancomycin (VANCOREADY) IVPB 1500 mg/300 mL  Status:  Discontinued        1,500 mg 150 mL/hr over  120 Minutes Intravenous Every 24 hours 01/10/22 1611 01/11/22 0533   01/11/22 1300  cefTRIAXone (ROCEPHIN) 2 g in sodium chloride 0.9 % 100 mL IVPB  Status:  Discontinued        2 g 200 mL/hr over 30 Minutes Intravenous Every 24 hours 01/11/22 0533 01/12/22 1337   01/11/22 1300  vancomycin (VANCOREADY) IVPB 1250 mg/250 mL  Status:  Discontinued        1,250 mg 166.7 mL/hr over 90 Minutes Intravenous Every 24 hours 01/11/22 0922 01/12/22 1337   01/10/22 2200  ceFEPIme (MAXIPIME) 2 g in sodium chloride 0.9 % 100 mL IVPB  Status:  Discontinued        2 g 200 mL/hr over 30 Minutes Intravenous Every 8 hours 01/10/22 1521 01/11/22 0533   01/10/22 2200  metroNIDAZOLE (FLAGYL) IVPB 500 mg  Status:  Discontinued        500 mg 100 mL/hr over 60 Minutes Intravenous Every 12 hours 01/10/22 1521 01/11/22 0824   01/10/22 1230  ceFEPIme (MAXIPIME) 2 g in sodium chloride 0.9 % 100 mL IVPB        2 g 200 mL/hr over 30 Minutes Intravenous  Once 01/10/22 1221 01/10/22 1324   01/10/22 1230  metroNIDAZOLE (FLAGYL) IVPB 500 mg        500 mg 100  mL/hr over 60 Minutes Intravenous  Once 01/10/22 1221 01/10/22 1325   01/10/22 1230  vancomycin (VANCOCIN) IVPB 1000 mg/200 mL premix  Status:  Discontinued        1,000 mg 200 mL/hr over 60 Minutes Intravenous  Once 01/10/22 1221 01/10/22 1228   01/10/22 1230  vancomycin (VANCOREADY) IVPB 2000 mg/400 mL        2,000 mg 200 mL/hr over 120 Minutes Intravenous  Once 01/10/22 1228 01/10/22 1537       Assessment/Plan: E. Coli bacteremia Enterococcus casseliflavus bacteremia Cholelithiasis, choledocholithiasis, possible cholangitis    POD#2 s/p lap chole  From a surgical standpoint, he is doing well.   Likely home this weekend once IV antibiotics completed per ID  We will sign off for now.  I will see him post op in the office   Coralie Keens 01/21/2022

## 2022-01-21 NOTE — Progress Notes (Signed)
PROGRESS NOTE   [name] [name]  ZPH:[id number] DOB: 04-27-1955 DOA: 01/10/2022 PCP: [name], MD   Date of Service: the patient was seen and examined on 01/21/2022  Brief Narrative:  66 y.o. male with a history of stage IV non-small cell lung cancer, coronary artery disease, diastolic congestive heart failure, depression, anxiety, chronic pain, osteoarthritis, COPD, hyperlipidemia, fibromyalgia, GERD, migraine, hypertension, obesity.  Patient has a pertinent additional history of choledocholithiasis with a history of prior ERCP and sphincterotomy 02/2021.    Patient presented secondary to abdominal pain, nausea and vomiting with evidence of a CBD stone.   Patient met sepsis criteria on admission and was found to have an E. Coli and Enterococcus casseliflavus bacteremia.  Dr. Juleen China with infectious disease was consulted.  Patient's Port-A-Cath was removed on 11/10.  TEE was performed and was negative.  Intravenous antibiotics were initiated initially with intravenous ceftriaxone and vancomycin with regimen adjusted to Unasyn monotherapy due to concerns for cholangitis.    Gastroenterology was additionally consulted and patient underwent another ERCP on 11/9 with sphincterotomy and CBD stone extraction.  Due to this being the patient's second ERCP with stone extraction Dr. Ninfa Linden with general surgery was consulted patient undergoing successful laparoscopic cholecystectomy on 11/15 without postoperative complication.  Per infectious disease recommendations, patient was continued to be treated with intravenous antibiotic therapy through 11/18 followed by oral high-dose amoxicillin to complete a 2-week course of treatment since Port-A-Cath removal.    Assessment and Plan: * Acute cholecystitis Status post laparoscopic cholecystectomy 80/16 without complication Advancing diet as tolerated Infectious disease recommends 3 days of additional intravenous Unasyn status post laparoscopic  cholecystectomy through 11/18 Patient will then need to be transition to high-dose amoxicillin through 11/24 to complete a total of 2 weeks of antibiotic therapy for bacteremia status post removal of Port-A-Cath  Choledocholithiasis with obstruction Status post ERCP with stone extraction 11/9 Remainder of assessment and plan as above  Cholangitis Treatment with intravenous and oral antibiotic therapy as noted above for associated bacteremia and sepsis  Sepsis due to Gram negative bacteria ([place]) Please see assessment and plan above  Gram-negative bacteremia E. coli and Enterococcus Subclavius bacteremia Treating with total of 2 weeks of antibiotic therapy through 11/24 per infectious disease recommendations Remainder of assessment and plan as above  CAD S/P percutaneous coronary angioplasty Patient is currently chest pain-free  Chronic diastolic CHF (congestive heart failure) ([place]) No clinical evidence of cardiogenic volume overload Strict input and output monitoring Daily weights Low-sodium diet   COPD (chronic obstructive pulmonary disease) ([place]) No evidence of COPD exacerbation this time Continue home regimen of maintenance inhalers As needed bronchodilator therapy for episodic shortness of breath and wheezing.   Non-small cell carcinoma of right lung, stage 4 (HCC) Not actively being treated Has followed with [facility] in the outpatient setting.     Subjective:  Patient reports generalized abdominal pain.  Mild in intensity, worse in the epigastric region.  Patient reports that his abdominal pain is improved since yesterday.  Abdominal pain is worse with movement and improved with rest.  Patient's appetite is additionally improving.    Physical Exam:  Vitals:   01/20/22 2156 01/21/22 0636 01/21/22 1308 01/21/22 2024  BP: (!) 147/70 (!) 141/68 (!) 145/69 (!) 153/74  Pulse: 66 64 73 68  Resp: _0 Temp: 98.6 F (37 C) 98.5 F (36.9 C) 98.2 F (36.8 C)  98.6 F (37 C)  TempSrc: Oral Oral Oral Oral  SpO2: 98% 99% 96% 96%  Weight:  110.8 kg    Height:        Constitutional: Awake alert and oriented x3, no associated distress.   Skin: Laparoscopic wounds are all closed without any evidence of drainage or redness or induration. Eyes: Pupils are equally reactive to light.  No evidence of scleral icterus or conjunctival pallor.  ENMT: Moist mucous membranes noted.  Posterior pharynx clear of any exudate or lesions.   Respiratory: clear to auscultation bilaterally, no wheezing, no crackles. Normal respiratory effort. No accessory muscle use.  Cardiovascular: Regular rate and rhythm, no murmurs / rubs / gallops. No extremity edema. 2+ pedal pulses. No carotid bruits.  Abdomen: Continued generalized abdominal tenderness.  Abdomen is soft.  Hypoactive bowel sounds noted.  No organomegaly appreciated. Musculoskeletal: No joint deformity upper and lower extremities. Good ROM, no contractures. Normal muscle tone.    Data Reviewed:  I have personally reviewed and interpreted labs, imaging.  Significant findings are   CBC: Recent Labs  Lab 01/17/22 0634 01/18/22 0538 01/19/22 1520 01/20/22 0603 01/21/22 0544  WBC 5.4 5.1 6.9 8.2 5.4  NEUTROABS 2.8 2.5 6.0 6.7 2.9  HGB 11.5* 12.2* 11.4* 12.7* 11.4*  HCT 36.1* 37.5* 36.2* 39.9 36.1*  MCV 98.9 99.7 101.7* 100.8* 100.6*  PLT 200 227 240 304 568    Basic Metabolic Panel: Recent Labs  Lab 01/17/22 0634 01/18/22 0538 01/19/22 0525 01/20/22 0603 01/21/22 0544  NA 141 141 140 141 139  K 3.1* 3.4* 3.6 4.3 3.4*  CL 106 105 110 108 106  CO2 _0 GLUCOSE 113* 105* 102* 136* 94  BUN _1 CREATININE 1.01 0.96 0.97 0.97 0.90  CALCIUM 8.2* 8.4* 8.2* 8.8* 8.1*  MG  --   --   --  2.1  --     GFR: Estimated Creatinine Clearance: 99 mL/min (by C-G formula based on SCr of 0.9 mg/dL). Liver Function Tests: Recent Labs  Lab 01/17/22 0634 01/18/22 0538 01/19/22 0525  01/20/22 0603 01/21/22 0544  AST 77* 80* 68* 82* 66*  ALT 142* 149* 133* 154* 131*  ALKPHOS 74 72 64 74 61  BILITOT 1.0 1.0 0.9 1.3* 0.8  PROT 5.8* 6.1* 5.7* 6.7 5.8*  ALBUMIN 2.8* 2.7* 3.0* 3.3* 2.8*    Code Status:  Full code.  Code status decision has been confirmed with: patient Family Communication: deferred    Severity of Illness:  The appropriate patient status for this patient is INPATIENT. Inpatient status is judged to be reasonable and necessary in order to provide the required intensity of service to ensure the patient's safety. The patient's presenting symptoms, physical exam findings, and initial radiographic and laboratory data in the context of their chronic comorbidities is felt to place them at high risk for further clinical deterioration. Furthermore, it is not anticipated that the patient will be medically stable for discharge from the hospital within 2 midnights of admission.   * I certify that at the point of admission it is my clinical judgment that the patient will require inpatient hospital care spanning beyond 2 midnights from the point of admission due to high intensity of service, high risk for further deterioration and high frequency of surveillance required.*  Time spent:  34 minutes  Author:  Vernelle Emerald MD  01/21/2022 8:38 PM

## 2022-01-21 NOTE — Progress Notes (Signed)
Mobility Specialist - Progress Note   01/21/22 1017  Mobility  Activity Ambulated with assistance in hallway  Level of Assistance Contact guard assist, steadying assist  Assistive Device Front wheel walker  Distance Ambulated (ft) 130 ft  Activity Response Tolerated well  Mobility Referral Yes  $Mobility charge 1 Mobility   Pt received in bed and agreed to mobility, pain during entire session, audible groaning. Offered to cut session prior to beginning due to pain and pt refused stating to "just get it over with". Pt back in bed with all needs met and RN notified of pain.     Mobility Specialist   

## 2022-01-22 ENCOUNTER — Encounter: Payer: Self-pay | Admitting: Physician Assistant

## 2022-01-22 ENCOUNTER — Other Ambulatory Visit (HOSPITAL_COMMUNITY): Payer: Self-pay

## 2022-01-22 ENCOUNTER — Encounter: Payer: Self-pay | Admitting: Internal Medicine

## 2022-01-22 DIAGNOSIS — K8043 Calculus of bile duct with acute cholecystitis with obstruction: Secondary | ICD-10-CM | POA: Diagnosis not present

## 2022-01-22 DIAGNOSIS — I2510 Atherosclerotic heart disease of native coronary artery without angina pectoris: Secondary | ICD-10-CM | POA: Diagnosis not present

## 2022-01-22 DIAGNOSIS — I5032 Chronic diastolic (congestive) heart failure: Secondary | ICD-10-CM | POA: Diagnosis not present

## 2022-01-22 DIAGNOSIS — K810 Acute cholecystitis: Secondary | ICD-10-CM | POA: Diagnosis not present

## 2022-01-22 MED ORDER — AMOXICILLIN 500 MG PO CAPS
1000.0000 mg | ORAL_CAPSULE | Freq: Three times a day (TID) | ORAL | 0 refills | Status: DC
  Filled 2022-01-22: qty 18, 3d supply, fill #0

## 2022-01-22 MED ORDER — CLONAZEPAM 1 MG PO TABS
1.0000 mg | ORAL_TABLET | Freq: Once | ORAL | Status: AC
  Administered 2022-01-22: 1 mg via ORAL
  Filled 2022-01-22: qty 1

## 2022-01-22 NOTE — Evaluation (Signed)
Physical Therapy Evaluation Patient Details Name: [name] [name] MRN: [id number] DOB: 06-Aug-1955 Today's Date: 01/22/2022  History of Present Illness  66 y.o. male with a history of stage IV non-small cell lung cancer, coronary artery disease, diastolic congestive heart failure, depression, anxiety, chronic pain, osteoarthritis, COPD, hyperlipidemia, fibromyalgia, GERD, migraine, hypertension, obesity.     Patient presented secondary to abdominal pain, nausea and vomiting with evidence of a CBD stone. Patient met sepsis criteria on admission . Patient's Port-A-Cath was removed on 11/10.  TEE was performed and was negative.S/P ERCP.  Clinical Impression  Pt admitted with above diagnosis.  Pt currently with functional limitations due to the deficits listed below (see PT Problem List). Pt will benefit from skilled PT to increase their independence and safety with mobility to allow discharge to the venue listed below.   The patient somewhat irritable that PT asking for  patient to ambulate again. Patient did ambulate using RW.  Patient reports having a rollator but requesting a " small" RW. Attempted to explain that the RW that he will receive when ordered is the same as the one in room. Patient also requesting a tray for the RW. Explained that he would have to order from home. Patient stated that his sister was home getting his house ready. Unsure of family support. Continue mobility as tolerated         Recommendations for follow up therapy are one component of a multi-disciplinary discharge planning process, led by the attending physician.  Recommendations may be updated based on patient status, additional functional criteria and insurance authorization.  Follow Up Recommendations Home health PT      Assistance Recommended at Discharge PRN  Patient can return home with the following  Assistance with cooking/housework;Assist for transportation;A little help with bathing/dressing/bathroom;Direct  supervision/assist for medications management    Equipment Recommendations Rolling walker (2 wheels)  Recommendations for Other Services       Functional Status Assessment Patient has had a recent decline in their functional status and demonstrates the ability to make significant improvements in function in a reasonable and predictable amount of time.     Precautions / Restrictions Precautions Precautions: Fall      Mobility  Bed Mobility Overal bed mobility: Independent                  Transfers Overall transfer level: Modified independent Equipment used: Rolling walker (2 wheels)               General transfer comment: pt asked for no assistnace    Ambulation/Gait Ambulation/Gait assistance: Supervision Gait Distance (Feet): 110 Feet Assistive device: Rolling walker (2 wheels) Gait Pattern/deviations: Step-through pattern       General Gait Details: no balance loss  Stairs            Wheelchair Mobility    Modified Rankin (Stroke Patients Only)       Balance Overall balance assessment: Mild deficits observed, not formally tested                                           Pertinent Vitals/Pain Pain Assessment Pain Assessment: Faces Faces Pain Scale: Hurts little more Pain Location: abd Pain Descriptors / Indicators: Grimacing, Guarding, Moaning Pain Intervention(s): Monitored during session    Home Living Family/patient expects to be discharged to:: Private residence Living Arrangements: Alone Available Help at Discharge: Available  PRN/intermittently Type of Home: House Home Access: Ramped entrance       Home Layout: One level Home Equipment: Grab bars - tub/shower;Air cabin crew (4 wheels) Additional Comments: pt. overall not interested in PT, amb. w/ mobility Tech    Prior Function Prior Level of Function : Independent/Modified Independent             Mobility Comments: ambulates short distance  in community, does not drive       Hand Dominance        Extremity/Trunk Assessment   Upper Extremity Assessment Upper Extremity Assessment: Overall WFL for tasks assessed    Lower Extremity Assessment Lower Extremity Assessment: Overall WFL for tasks assessed    Cervical / Trunk Assessment Cervical / Trunk Assessment: Normal  Communication   Communication: No difficulties  Cognition Arousal/Alertness: Awake/alert Behavior During Therapy: Agitated, Anxious Overall Cognitive Status: Impaired/Different from baseline Area of Impairment: Attention, Safety/judgement, Awareness                         Safety/Judgement: Decreased awareness of deficits Awareness: Emergent   General Comments: pt. verbally irritated that he has to walk again and that his IV antibx has not been changed and that his bed is wet  since wick leaked. Patient wants a smaller RW with a tray, not like the one in the room.        General Comments      Exercises     Assessment/Plan    PT Assessment Patient needs continued PT services  PT Problem List Decreased mobility;Decreased knowledge of precautions;Decreased safety awareness;Pain       PT Treatment Interventions DME instruction;Therapeutic activities;Functional mobility training;Gait training;Patient/family education    PT Goals (Current goals can be found in the Care Plan section)  Acute Rehab PT Goals Patient Stated Goal: go home PT Goal Formulation: With patient Time For Goal Achievement: 02/05/22 Potential to Achieve Goals: Good    Frequency Min 2X/week     Co-evaluation               AM-PAC PT "6 Clicks" Mobility  Outcome Measure Help needed turning from your back to your side while in a flat bed without using bedrails?: None Help needed moving from lying on your back to sitting on the side of a flat bed without using bedrails?: None Help needed moving to and from a bed to a chair (including a wheelchair)?:  None Help needed standing up from a chair using your arms (e.g., wheelchair or bedside chair)?: None Help needed to walk in hospital room?: A Little Help needed climbing 3-5 steps with a railing? : A Lot 6 Click Score: 21    End of Session   Activity Tolerance: Patient tolerated treatment well Patient left: in bed;with bed alarm set Nurse Communication: Mobility status PT Visit Diagnosis: Unsteadiness on feet (R26.81)    Time: 8242-3536 PT Time Calculation (min) (ACUTE ONLY): 14 min   Charges:   PT Evaluation $PT Eval Low Complexity: 1 Low          [place] Office [phone] Weekend pager-[phone]  Claretha Cooper 01/22/2022, 1:41 PM

## 2022-01-22 NOTE — Discharge Summary (Addendum)
Physician Discharge Summary   Patient: [name] [name] MRN: [id number] DOB: February 19, 1956  Admit date:     01/10/2022  Discharge date: 01/22/22  Discharge Physician: Vernelle Emerald   PCP: [name], MD   Recommendations at discharge:   Patient has been discharged to take all prescribed medications exactly as instructed including the remaining course of amoxicillin. Patient has been instructed to ambulate using a walker considering his unsteady gait Arrangements have been made for [facility] home-going hospice services in addition to home health physical therapy services Patient has been advised to follow-up as an outpatient with his primary care provider in 1 to 2 weeks Patient has been advised to follow-up closely in the outpatient setting with general surgery Patient has been advised to return to the emergency department if he develops any worsening abdominal pain, fevers, inability to tolerate oral intake or weakness.  Discharge Diagnoses: Principal Problem:   Acute cholecystitis Active Problems:   Choledocholithiasis with obstruction   Cholangitis   Sepsis due to Gram negative bacteria (HCC)   Gram-negative bacteremia   CAD S/P percutaneous coronary angioplasty   Chronic diastolic CHF (congestive heart failure) (HCC)   COPD (chronic obstructive pulmonary disease) (HCC)   Non-small cell carcinoma of right lung, stage 4 (HCC)  Resolved Problems:   * No resolved hospital problems. *   Hospital Course: 66 y.o. male with a history of stage IV non-small cell lung cancer, coronary artery disease, diastolic congestive heart failure, depression, anxiety, chronic pain, osteoarthritis, COPD, hyperlipidemia, fibromyalgia, GERD, migraine, hypertension, obesity.  Patient has a pertinent additional history of choledocholithiasis with a history of prior ERCP and sphincterotomy 02/2021.    Patient presented secondary to abdominal pain, nausea and vomiting with evidence of a CBD stone.    Patient met sepsis criteria on admission and was found to have an E. Coli and Enterococcus casseliflavus bacteremia.  Dr. Juleen China with infectious disease was consulted.  Patient's Port-A-Cath was removed on 11/10.  TEE was performed and was negative.  Intravenous antibiotics were initiated initially with intravenous ceftriaxone and vancomycin with regimen adjusted to Unasyn monotherapy due to concerns for cholangitis.    Gastroenterology was additionally consulted and patient underwent another ERCP on 11/9 with sphincterotomy and CBD stone extraction.  Due to this being the patient's second ERCP with stone extraction Dr. Ninfa Linden with general surgery was consulted patient undergoing successful laparoscopic cholecystectomy on 11/15 without postoperative complication.  Per infectious disease recommendations, patient was continued to be treated with intravenous antibiotic therapy through 11/18 followed by oral high-dose amoxicillin to complete a 2-week course of treatment since Port-A-Cath removal.  In the days that followed patient continued to clinically improve postoperatively.  Patient continued to receive intravenous antibiotics and was transitioned to high-dose oral amoxicillin on 11/18.  Due to patient's prolonged hospitalization patient was found to exhibit generalized weakness due to progressive deconditioning.  Patient was evaluated by physical therapy and arrangements were made for patient to have home health physical therapy services at time of discharge.  Patient will additionally be ordered home health nursing and hospice services at that time due to patient's stage IV lung cancer, coronary artery disease, congestive heart failure and prolonged hospital course.  Patient was discharged in improved and stable condition on 01/22/2022.      Pain control - [employer] Controlled Substance Reporting System database was reviewed. and patient was instructed, not to drive, operate heavy  machinery, perform activities at heights, swimming or participation in water activities or provide baby-sitting services  while on Pain, Sleep and Anxiety Medications; until their outpatient Physician has advised to do so again. Also recommended to not to take more than prescribed Pain, Sleep and Anxiety Medications.   Consultants: Dr. Ninfa Linden with general surgery.  Dr. Tarri Glenn with gastroenterology.  Dr. Juleen China with Infectious Disease. Procedures performed: Laparoscopic cholecystectomy performed on 11/15 by Dr. Ninfa Linden. Disposition: Home health Diet recommendation:  Discharge Diet Orders (From admission, onward)     Start     Ordered   01/22/22 0000  Diet - low sodium heart healthy        01/22/22 1310           Cardiac diet  DISCHARGE MEDICATION: Allergies as of 01/22/2022       Reactions   Fish Allergy Nausea And Vomiting   Iohexol Other (See Comments)    Code: HIVES, Desc: PT STATES HE HAD AN IVP 15 YRS AGO AND HAD SOB AND BROKE OUT IN HIVES., Onset Date: [id number]   Ivp Dye [iodinated Contrast Media] Other (See Comments)   intolerance        Medication List     STOP taking these medications    acetaminophen 500 MG tablet Commonly known as: TYLENOL   calcium carbonate 750 MG chewable tablet Commonly known as: TUMS EX       TAKE these medications    albuterol (2.5 MG/3ML) 0.083% nebulizer solution Commonly known as: PROVENTIL Take 3 mLs (2.5 mg total) by nebulization every 6 (six) hours as needed for wheezing or shortness of breath.   allopurinol 100 MG tablet Commonly known as: ZYLOPRIM Take 200 mg daily by mouth.   amoxicillin 500 MG capsule Commonly known as: AMOXIL Take 2 capsules (1,000 mg total) by mouth every 8 (eight) hours. Start taking on: January 23, 2022   Breztri Aerosphere 160-9-4.8 MCG/ACT Aero Generic drug: Budeson-Glycopyrrol-Formoterol Inhale 2 puffs into the lungs in the morning and at bedtime. What changed:  when to take  this reasons to take this   Brilinta 60 MG Tabs tablet Generic drug: ticagrelor TAKE 1 TABLET(60 MG) BY MOUTH TWICE DAILY What changed: See the new instructions.   clonazePAM 1 MG tablet Commonly known as: KLONOPIN Take 1 mg by mouth at bedtime as needed for anxiety.   diphenhydrAMINE 25 MG tablet Commonly known as: BENADRYL Take 25 mg by mouth every 6 (six) hours as needed for itching.   docusate sodium 100 MG capsule Commonly known as: COLACE Take 1 capsule (100 mg total) by mouth 2 (two) times daily. What changed:  when to take this reasons to take this   fluticasone 50 MCG/ACT nasal spray Commonly known as: FLONASE Place 1 spray into both nostrils daily as needed for allergies.   furosemide 20 MG tablet Commonly known as: LASIX TAKE 1 TABLET(20 MG) BY MOUTH DAILY AS NEEDED What changed: See the new instructions.   hydrOXYzine 50 MG capsule Commonly known as: VISTARIL Take 50 mg by mouth 3 (three) times daily as needed for itching.   morphine 60 MG 12 hr tablet Commonly known as: MS CONTIN 60 mg every 12 (twelve) hours.   Mucus Relief ER 600 MG 12 hr tablet Generic drug: guaiFENesin Take 600 mg by mouth 2 (two) times daily as needed for cough or to loosen phlegm.   nitroGLYCERIN 0.4 MG SL tablet Commonly known as: NITROSTAT Place 1 tablet (0.4 mg total) under the tongue every 5 (five) minutes as needed for chest pain. X 3 doses   potassium chloride 10 MEQ  tablet Commonly known as: KLOR-CON Take 10 mEq by mouth as needed. Take when Lasix is taken   rosuvastatin 20 MG tablet Commonly known as: CRESTOR Take 1 tablet (20 mg total) by mouth daily.               Durable Medical Equipment  (From admission, onward)           Start     Ordered   01/22/22 1308  DME Walker  Once       Question Answer Comment  Walker: With 5 Inch Wheels   Patient needs a walker to treat with the following condition Stage 4 lung cancer ([place])   Patient needs a walker  to treat with the following condition Congestive heart failure (CHF) [facility])   Patient needs a walker to treat with the following condition Coronary artery disease involving native heart      01/22/22 1310            Follow-up Information     Maczis, Puja Gosai, PA-C. Go on 02/10/2022.   Specialty: General Surgery Why: Your appointment is 02/10/22 at 2 pm Arrive 28mn early to check in, fill out paperwork, Bring photo ID and insurance information Contact information: [id number]         RMerrilee Seashore MD. Schedule an appointment as soon as possible for a visit in 1 week(s).   Specialty: Internal Medicine Contact information: [id number] [phone]                Discharge Exam: FDanley DankerWeights   01/19/22 0458 01/21/22 0636 01/22/22 0458  Weight: 114.5 kg 110.8 kg 110.8 kg    Constitutional: Awake alert and oriented x3, no associated distress.   Respiratory: clear to auscultation bilaterally, no wheezing, no crackles. Normal respiratory effort. No accessory muscle use.  Cardiovascular: Regular rate and rhythm, no murmurs / rubs / gallops. No extremity edema. 2+ pedal pulses. No carotid bruits.  Abdomen: mild lower abdominal tenderness that is dramatically improved compared to earlier exams Musculoskeletal: No joint deformity upper and lower extremities. Good ROM, no contractures. Normal muscle tone.     Condition at discharge: fair  The results of significant diagnostics from this hospitalization (including imaging, microbiology, ancillary and laboratory) are listed below for reference.   Imaging Studies: IR REMOVAL TUN ACCESS W/ PORT W/O FL MOD SED  Result Date: 01/14/2022 INDICATION: Bacteremia. Chest port placed by Dr. HVernard Gambleson 02/02/2021. It has been utilized without issues. EXAM: REMOVAL RIGHT IJ VEIN PORT-A-CATH MEDICATIONS: Patient is on inpatient course of  antibiotics. ANESTHESIA/SEDATION: None COMPLICATIONS: None immediate. PROCEDURE: Informed written consent was obtained from the patient after a thorough discussion of the procedural risks, benefits and alternatives. All questions were addressed. Maximal Sterile Barrier Technique was utilized including caps, mask, sterile gowns, sterile gloves, sterile drape, hand hygiene and skin antiseptic. A timeout was performed prior to the initiation of the procedure. Right anterior upper chest prepped and draped in the usual sterile fashion. Following local lidocaine administration, an incision was made at the superior margin of the port pocket. The pocket was dissected, and the port and catheter were removed in their entirety. The incision was closed with deep and superficial absorbable suture and sealed with Dermabond. IMPRESSION: Successful right IJ vein Port-A-Cath explant. Electronically Signed   By: FMiachel RouxM.D.   On: 01/14/2022 17:12   DG ERCP  Result Date: 01/13/2022 CLINICAL  DATA:  ERCP. EXAM: ERCP TECHNIQUE: Multiple spot images obtained with the fluoroscopic device and submitted for interpretation post-procedure. FLUOROSCOPY TIME: FLUOROSCOPY TIME 3 minutes, 27 seconds (111.3 mGy) COMPARISON:  MRCP-01/13/2022 CT abdomen and pelvis-01/10/2022 FINDINGS: 15 spot intraoperative fluoroscopic images the right upper abdominal quadrant during ERCP are provided for review Initial image demonstrates an ERCP probe overlying the right upper abdominal quadrant. Initial images demonstrate selective cannulation and opacification of the common bile duct. Subsequent images demonstrate insufflation of a balloon within the central aspect of the CBD. Balloon cholangiogram demonstrates a persistent nonocclusive filling defect within the distal aspect of the CBD (image 7) with subsequent biliary sweeping and presumed sphincterotomy. There is minimal opacification of the central aspect of the intrahepatic biliary tree which  appears nondilated. There is opacification of the cystic duct and faint opacification of the gallbladder lumen. There is no definitive opacification of the pancreatic duct. IMPRESSION: ERCP with biliary sweeping, stone extraction and presumed sphincterotomy as detailed above. These images were submitted for radiologic interpretation only. Please see the procedural report for the amount of contrast and the fluoroscopy time utilized. Electronically Signed   By: Sandi Mariscal M.D.   On: 01/13/2022 15:14   MR ABDOMEN MRCP WO CONTRAST  Result Date: 01/13/2022 CLINICAL DATA:  Right upper quadrant pain.  History of lung cancer. EXAM: MRI ABDOMEN WITHOUT CONTRAST  (INCLUDING MRCP) TECHNIQUE: Multiplanar multisequence MR imaging of the abdomen was performed. Heavily T2-weighted images of the biliary and pancreatic ducts were obtained, and three-dimensional MRCP images were rendered by post processing. COMPARISON:  CT AP 01/10/2022 and gallbladder sonogram 01/10/2022 FINDINGS: Lower chest: No acute abnormality. Hepatobiliary: Hepatic steatosis. No focal liver lesion identified. There are stones noted within the dependent portion of the gallbladder neck which measure up to 5 mm. Gallbladder wall edema measures 6 mm in thickness, image 22/5. No pericholecystic fluid. The common bile duct measures 7 mm in maximum diameter. Motion artifact diminishes detail within the common bile duct and limits assessment for underlying choledocholithiasis. The MRCP images are essentially nondiagnostic. On the coronal T2 weighted sequences (image 16/4) and the axial T2 weighted sequence (image 24/3) there is a suspect 7 filling defect at the level of the ampulla corresponding to the stone noted on 01/10/2022 CT of the abdomen pelvis. Review of the more remote CT from 01/19/2022 also shows a suspected stone within the distal CBD. Pancreas: Fatty infiltration of the pancreas. No signs of pancreatic inflammation, main duct dilatation or mass.  Spleen:  Within normal limits in size and appearance. 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 Normal right adrenal gland. Bilateral simple appearing kidney cysts. No follow-up imaging recommended. No signs of hydronephrosis. Stomach/Bowel: Visualized portions within the abdomen are unremarkable. Vascular/Lymphatic:  No aneurysm.  No signs of abdominal adenopathy. Other: No significant free fluid or fluid collections. There is edema identified along the right lateral abdominal wall and left posterolateral abdominal flank. Musculoskeletal: No suspicious bone lesions identified. IMPRESSION: 1. Gallstones and mild gallbladder wall edema. No pericholecystic fluid. 2. Motion artifact diminishes detail within the common bile duct and limits assessment for underlying choledocholithiasis. On the coronal T2 weighted sequences and the axial T2 weighted sequence there is a suspect filling defect at the level of the ampulla corresponding to the stone noted on 01/10/2022 CT of the abdomen pelvis. Review of the more remote CT from 01/19/2022 also shows a suspected stone within the distal CBD. 3. Hepatic  steatosis. 4. Known left adrenal gland metastasis appears unchanged from 01/10/2022 CT of the abdomen/pelvis. Electronically Signed   By: Kerby Moors M.D.   On: 01/13/2022 11:49   MR 3D Recon At Scanner  Result Date: 01/13/2022 CLINICAL DATA:  Right upper quadrant pain.  History of lung cancer. EXAM: MRI ABDOMEN WITHOUT CONTRAST  (INCLUDING MRCP) TECHNIQUE: Multiplanar multisequence MR imaging of the abdomen was performed. Heavily T2-weighted images of the biliary and pancreatic ducts were obtained, and three-dimensional MRCP images were rendered by post processing. COMPARISON:  CT AP 01/10/2022 and gallbladder sonogram 01/10/2022 FINDINGS: Lower chest: No acute abnormality. Hepatobiliary: Hepatic steatosis. No focal liver lesion  identified. There are stones noted within the dependent portion of the gallbladder neck which measure up to 5 mm. Gallbladder wall edema measures 6 mm in thickness, image 22/5. No pericholecystic fluid. The common bile duct measures 7 mm in maximum diameter. Motion artifact diminishes detail within the common bile duct and limits assessment for underlying choledocholithiasis. The MRCP images are essentially nondiagnostic. On the coronal T2 weighted sequences (image 16/4) and the axial T2 weighted sequence (image 24/3) there is a suspect 7 filling defect at the level of the ampulla corresponding to the stone noted on 01/10/2022 CT of the abdomen pelvis. Review of the more remote CT from 01/19/2022 also shows a suspected stone within the distal CBD. Pancreas: Fatty infiltration of the pancreas. No signs of pancreatic inflammation, main duct dilatation or mass. Spleen:  Within normal limits in size and appearance. 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 Normal right adrenal gland. Bilateral simple appearing kidney cysts. No follow-up imaging recommended. No signs of hydronephrosis. Stomach/Bowel: Visualized portions within the abdomen are unremarkable. Vascular/Lymphatic:  No aneurysm.  No signs of abdominal adenopathy. Other: No significant free fluid or fluid collections. There is edema identified along the right lateral abdominal wall and left posterolateral abdominal flank. Musculoskeletal: No suspicious bone lesions identified. IMPRESSION: 1. Gallstones and mild gallbladder wall edema. No pericholecystic fluid. 2. Motion artifact diminishes detail within the common bile duct and limits assessment for underlying choledocholithiasis. On the coronal T2 weighted sequences and the axial T2 weighted sequence there is a suspect filling defect at the level of the ampulla corresponding to the stone noted on 01/10/2022 CT of the  abdomen pelvis. Review of the more remote CT from 01/19/2022 also shows a suspected stone within the distal CBD. 3. Hepatic steatosis. 4. Known left adrenal gland metastasis appears unchanged from 01/10/2022 CT of the abdomen/pelvis. Electronically Signed   By: Kerby Moors M.D.   On: 01/13/2022 11:49   ECHOCARDIOGRAM COMPLETE  Result Date: 01/11/2022    ECHOCARDIOGRAM REPORT   Patient Name:   FRIEDRICH HARRIOTT Date of Exam: 01/11/2022 Medical Rec #:  [id number]      Height:       69.0 in Accession #:    [id number]     Weight:       236.6 lb Date of Birth:  Jul 16, 1955      BSA:          2.219 m Patient Age:    66 years       BP:           118/64 mmHg Patient Gender: M              HR:           102 bpm. Exam Location:  Inpatient Procedure:  2D Echo, Cardiac Doppler, Color Doppler and Intracardiac            Opacification Agent Indications:    I25.110 Atherosclerotic heart disease of native coronary artery                 with unstable angina pectoris; I50.40* Unspecified combined                 systolic (congestive) and diastolic (congestive) heart failure  History:        Patient has prior history of Echocardiogram examinations, most                 recent 07/22/2019. CAD and Previous Myocardial Infarction,                 Signs/Symptoms:Dyspnea and Bacteremia; Risk Factors:Hypertension                 and Current Smoker. Chemo. Lung cancer.  Sonographer:    Roseanna Rainbow RDCS Referring Phys: [id number] [place]  Sonographer Comments: Technically difficult study due to poor echo windows, suboptimal apical window, suboptimal parasternal window, Technically challenging study due to limited acoustic windows, suboptimal subcostal window and patient is obese. Image acquisition challenging due to patient body habitus and Image acquisition challenging due to respiratory motion. Extremely difficult due to respiratory motion. Could not turn patient due to extreme pain and restraint belt. IMPRESSIONS  1. Left ventricular  ejection fraction, by estimation, is 60 to 65%. The left ventricle has normal function. The left ventricle has no regional wall motion abnormalities. Left ventricular diastolic parameters are indeterminate.  2. Right ventricular systolic function is normal. The right ventricular size is normal. Tricuspid regurgitation signal is inadequate for assessing PA pressure.  3. Left atrial size was mildly dilated.  4. Right atrial size was mildly dilated.  5. The mitral valve is grossly normal. No evidence of mitral valve regurgitation.  6. The aortic valve was not well visualized. Aortic valve regurgitation is not visualized. No aortic stenosis is present.  7. The inferior vena cava is normal in size with greater than 50% respiratory variability, suggesting right atrial pressure of 3 mmHg. Comparison(s): There is prominent respiration related variation in mitral inflow velocities and ventricular septal displacement (but no other findings to support constrictive or tamponade physiology). Consider increased work of breathing due to asthma/COPD exacerbation. FINDINGS  Left Ventricle: Left ventricular ejection fraction, by estimation, is 60 to 65%. The left ventricle has normal function. The left ventricle has no regional wall motion abnormalities. Definity contrast agent was given IV to delineate the left ventricular  endocardial borders. The left ventricular internal cavity size was normal in size. There is borderline concentric left ventricular hypertrophy. Left ventricular diastolic parameters are indeterminate. Right Ventricle: The right ventricular size is normal. No increase in right ventricular wall thickness. Right ventricular systolic function is normal. Tricuspid regurgitation signal is inadequate for assessing PA pressure. Left Atrium: Left atrial size was mildly dilated. Right Atrium: Right atrial size was mildly dilated. Pericardium: The pericardium was not well visualized. Presence of epicardial fat layer. Mitral  Valve: The mitral valve is grossly normal. No evidence of mitral valve regurgitation. Tricuspid Valve: The tricuspid valve is not well visualized. Tricuspid valve regurgitation is not demonstrated. Aortic Valve: The aortic valve was not well visualized. Aortic valve regurgitation is not visualized. No aortic stenosis is present. Pulmonic Valve: The pulmonic valve was not well visualized. Pulmonic valve regurgitation is not visualized. Aorta: The ascending aorta was  not well visualized. Venous: The inferior vena cava is normal in size with greater than 50% respiratory variability, suggesting right atrial pressure of 3 mmHg. IAS/Shunts: No atrial level shunt detected by color flow Doppler.  LEFT VENTRICLE PLAX 2D LVIDd:         5.60 cm     Diastology LVIDs:         3.70 cm     LV e' medial:    8.27 cm/s LV PW:         1.20 cm     LV E/e' medial:  8.9 LV IVS:        1.10 cm     LV e' lateral:   7.40 cm/s LVOT diam:     2.20 cm     LV E/e' lateral: 10.0 LV SV:         32 LV SV Index:   14 LVOT Area:     3.80 cm  LV Volumes (MOD) LV vol d, MOD A2C: 53.6 ml LV vol d, MOD A4C: 69.9 ml LV vol s, MOD A2C: 20.2 ml LV vol s, MOD A4C: 27.0 ml LV SV MOD A2C:     33.4 ml LV SV MOD A4C:     69.9 ml LV SV MOD BP:      40.7 ml RIGHT VENTRICLE            IVC RV S prime:     6.96 cm/s  IVC diam: 2.10 cm TAPSE (M-mode): 1.9 cm LEFT ATRIUM           Index        RIGHT ATRIUM           Index LA diam:      3.90 cm 1.76 cm/m   RA Area:     20.30 cm LA Vol (A2C): 11.3 ml 5.09 ml/m   RA Volume:   60.70 ml  27.36 ml/m LA Vol (A4C): 49.4 ml 22.26 ml/m  AORTIC VALVE LVOT Vmax:   60.30 cm/s LVOT Vmean:  37.900 cm/s LVOT VTI:    0.084 m  AORTA Ao Root diam: 3.70 cm Ao Asc diam:  3.60 cm MITRAL VALVE MV Area (PHT): 4.69 cm    SHUNTS MV Decel Time: 162 msec    Systemic VTI:  0.08 m MV E velocity: 73.87 cm/s  Systemic Diam: 2.20 cm MV A velocity: 79.13 cm/s MV E/A ratio:  0.93 Mihai Croitoru MD Electronically signed by Sanda Klein MD  Signature Date/Time: 01/11/2022/1:45:55 PM    Final    DG Abd Portable 1V  Result Date: 01/11/2022 CLINICAL DATA:  Abdominal distension and pain. EXAM: PORTABLE ABDOMEN - 1 VIEW COMPARISON:  01/10/2022 FINDINGS: Exam detail is diminished secondary to body habitus. There is been mild increase in caliber of the small bowel loops within the right lower quadrant of the abdomen measuring up to 2.9 cm. Decreased colonic gas compared with the prior exam. IMPRESSION: Mild increase in caliber of the small bowel loops within the right lower quadrant of the abdomen with decrease colonic gas. If there is a clinical concern for developing bowel obstruction advise follow-up serial abdominal imaging to assess change in small bowel caliber. Electronically Signed   By: Kerby Moors M.D.   On: 01/11/2022 13:17   US Abdomen Limited RUQ (LIVER/GB)  Result Date: 01/10/2022 CLINICAL DATA:  Right upper quadrant abdominal pain EXAM: ULTRASOUND ABDOMEN LIMITED RIGHT UPPER QUADRANT COMPARISON:  CT scan 01/10/2022 FINDINGS: Gallbladder: Cholelithiasis noted, individual stones measuring up to  1.4 cm in diameter. Sonographic Murphy's sign absent. Most of the hyperechogenicity between the gallbladder in the adjacent liver is most likely attributable to adipose tissue given comparison to the CT scan from earlier today; no definite gallbladder wall thickening is observed. Common bile duct: Diameter: 0.5 cm in diameter. The choledocholithiasis shown on the CT from earlier today is not readily apparent on ultrasound. Liver: No focal lesion identified. Coarse echogenic liver with poor sonic penetration compatible with diffuse hepatic steatosis. Portal vein is patent on color Doppler imaging with normal direction of blood flow towards the liver. Limitations include patient difficulty with breath holding or remaining stationary. IMPRESSION: 1. Cholelithiasis without definite sonographic evidence of acute cholecystitis. 2. Choledocholithiasis  with shown on the CT from 12/10/2021 but was not well seen on today's ultrasound. 3. Coarse echogenic liver with poor sonic penetration compatible with diffuse hepatic steatosis. Electronically Signed   By: Van Clines M.D.   On: 01/10/2022 17:51   DG Chest Port 1 View  Result Date: 01/10/2022 CLINICAL DATA:  Concern for sepsis. EXAM: PORTABLE CHEST 1 VIEW COMPARISON:  05/17/2021; 04/09/2021 FINDINGS: Grossly unchanged enlarged cardiac silhouette and mediastinal contours with nodular prominence of the pulmonary hila. Mild pulmonary venous congestion without frank evidence of edema. No focal airspace opacities. No pleural effusion or pneumothorax. Stable position of support apparatus. No acute osseous abnormalities. IMPRESSION: Pulmonary venous congestion without frank evidence of edema on this AP portable examination. No discrete focal airspace opacities to suggest pneumonia. Further evaluation with a PA and lateral chest radiograph may be obtained as clinically indicated. Electronically Signed   By: Sandi Mariscal M.D.   On: 01/10/2022 13:33   CT ABDOMEN PELVIS WO CONTRAST  Result Date: 01/10/2022 CLINICAL DATA:  Abdominal pain, acute, nonlocalized. Patient reports inability to urinate. EXAM: CT ABDOMEN AND PELVIS WITHOUT CONTRAST TECHNIQUE: Multidetector CT imaging of the abdomen and pelvis was performed following the standard protocol without IV contrast. RADIATION DOSE REDUCTION: This exam was performed according to the departmental dose-optimization program which includes automated exposure control, adjustment of the mA and/or kV according to patient size and/or use of iterative reconstruction technique. COMPARISON:  10/19/2021 FINDINGS: Lower chest: Lung bases are clear.  No pleural or pericardial fluid. Hepatobiliary: Liver parenchyma is normal. Small amount of air in the biliary tree, less than seen previously. Few calcified stones dependent within the gallbladder. No sign of ductal dilatation.  However, there is suspicion of a 4 mm stone at the ampulla. Pancreas: Fatty atrophy.  No acute finding. Spleen: Normal Adrenals/Urinary Tract: Right adrenal gland is normal. 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 No pronounced change. No change in appearance of the kidneys. Bilateral cysts for which specific follow-up is not suggested. No hydronephrosis. Bladder is normal. Stomach/Bowel: Stomach and small intestine are normal. Normal appearance of the colon. Vascular/Lymphatic: Aortic atherosclerosis. No aneurysm. No abdominal lymphadenopathy. Small portal region nodes are unchanged. Reproductive: Normal Other: No free fluid or air. Musculoskeletal: No evidence of fracture or lytic destructive bone lesion. IMPRESSION: 1. Few calcified stones dependent within the gallbladder. No sign of ductal dilatation. However, there is suspicion of a 4 mm stone at the ampulla. 2. 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 No pronounced change. 3. Aortic atherosclerosis. 4. No hydronephrosis.  No evidence of bladder outlet obstruction. Aortic Atherosclerosis (ICD10-I70.0). Electronically Signed   By: Nelson Chimes M.D.   On: 01/10/2022 13:28   CT Head Wo Contrast  Result Date: 01/10/2022 CLINICAL DATA:  Mental status change.  Unknown cause. EXAM: CT HEAD WITHOUT CONTRAST TECHNIQUE: Contiguous axial images were obtained from the base of the skull through the vertex without intravenous contrast. RADIATION DOSE REDUCTION: This exam was performed according to the departmental dose-optimization program which includes automated exposure control, adjustment of the mA and/or kV according to patient size and/or use of iterative reconstruction technique. COMPARISON:  CT examination dated May 17, 2021. FINDINGS: Brain: No evidence of acute infarction, hemorrhage, hydrocephalus, extra-axial collection or mass lesion/mass effect. Patchy area of  low-attenuation of the periventricular white matter presumed chronic microvascular ischemic changes. Vascular: No hyperdense vessel or unexpected calcification. Skull: Normal. Negative for fracture or focal lesion. Sinuses/Orbits: Mucosal thickening and patchy opacification of the ethmoid air cells. Other: None. IMPRESSION: 1. No acute intracranial abnormality. 2. Chronic microvascular ischemic changes of the periventricular white matter. 3. Paranasal sinus disease. Electronically Signed   By: Keane Police D.O.   On: 01/10/2022 13:19    Microbiology: Results for orders placed or performed during the hospital encounter of 01/10/22  Blood Culture (routine x 2)     Status: Abnormal   Collection Time: 01/10/22 12:18 PM   Specimen: BLOOD  Result Value Ref Range Status   Specimen Description   Final    BLOOD LEFT ANTECUBITAL Performed at [facility] [address] [place], [place] [address]    Special Requests   Final    BOTTLES DRAWN AEROBIC AND ANAEROBIC Blood Culture adequate volume Performed at [facility] [address] [place], [place] [address]    Culture  Setup Time   Final    GRAM POSITIVE COCCI IN PAIRS IN BOTH AEROBIC AND ANAEROBIC BOTTLES CRITICAL VALUE NOTED.  VALUE IS CONSISTENT WITH PREVIOUSLY REPORTED AND CALLED VALUE.    Culture (A)  Final    ENTEROCOCCUS CASSELIFLAVUS SEE SEPARATE REPORT Performed at [facility], [place] [address] [place], [place] [address]    Report Status 01/21/2022 FINAL  Final  Blood Culture (routine x 2)     Status: Abnormal   Collection Time: 01/10/22 12:36 PM   Specimen: BLOOD  Result Value Ref Range Status   Specimen Description   Final    BLOOD PORTA CATH Performed at [facility] [address], [place], [place] [address]    Special Requests   Final    BOTTLES DRAWN AEROBIC AND ANAEROBIC Blood Culture adequate volume Performed at [facility]  [address] [place], [place] [address]    Culture  Setup Time   Final    GRAM POSITIVE COCCI IN PAIRS GRAM NEGATIVE RODS IN BOTH AEROBIC AND ANAEROBIC BOTTLES CRITICAL RESULT CALLED TO, READ BACK BY AND VERIFIED WITH: PHARMD D. POINDEXTER 01/11/22 _0  BY AB    Culture (A)  Final    ESCHERICHIA COLI CORRECTED RESULTS ENTEROCOCCUS CASSELIFLAVUS PREVIOUSLY REPORTED AS: ENTEROCOCCUS FAECALIS CORRECTED RESULTS CALLED TO: PHARMD TERRY G. [id number] FCP SUSCEPTIBILITIES PERFORMED ON PREVIOUS CULTURE WITHIN THE LAST 5 DAYS. Performed at [facility], [place] [address] [place],  [address]    Report Status 01/14/2022 FINAL  Final   Organism ID, Bacteria ESCHERICHIA COLI  Final      Susceptibility   Escherichia coli - MIC*    AMPICILLIN 8 SENSITIVE Sensitive     CEFAZOLIN <=4 SENSITIVE Sensitive     CEFEPIME <=0.12 SENSITIVE Sensitive     CEFTAZIDIME <=1 SENSITIVE Sensitive     CEFTRIAXONE <=0.25  SENSITIVE Sensitive     CIPROFLOXACIN <=0.25 SENSITIVE Sensitive     GENTAMICIN <=1 SENSITIVE Sensitive     IMIPENEM <=0.25 SENSITIVE Sensitive     TRIMETH/SULFA <=20 SENSITIVE Sensitive     AMPICILLIN/SULBACTAM 4 SENSITIVE Sensitive     PIP/TAZO <=4 SENSITIVE Sensitive     * ESCHERICHIA COLI  Blood Culture ID Panel (Reflexed)     Status: Abnormal   Collection Time: 01/10/22 12:36 PM  Result Value Ref Range Status   Enterococcus faecalis NOT DETECTED NOT DETECTED Final   Enterococcus Faecium NOT DETECTED NOT DETECTED Final   Listeria monocytogenes NOT DETECTED NOT DETECTED Final   Staphylococcus species NOT DETECTED NOT DETECTED Final   Staphylococcus aureus (BCID) NOT DETECTED NOT DETECTED Final   Staphylococcus epidermidis NOT DETECTED NOT DETECTED Final   Staphylococcus lugdunensis NOT DETECTED NOT DETECTED Final   Streptococcus species NOT DETECTED NOT DETECTED Final   Streptococcus agalactiae NOT DETECTED NOT DETECTED Final   Streptococcus pneumoniae NOT DETECTED NOT DETECTED  Final   Streptococcus pyogenes NOT DETECTED NOT DETECTED Final   A.calcoaceticus-baumannii NOT DETECTED NOT DETECTED Final   Bacteroides fragilis NOT DETECTED NOT DETECTED Final   Enterobacterales DETECTED (A) NOT DETECTED Final    Comment: Enterobacterales represent a large order of gram negative bacteria, not a single organism. CRITICAL RESULT CALLED TO, READ BACK BY AND VERIFIED WITH: PHARMD D. POINDEXTER 01/11/22 _0  BY AB    Enterobacter cloacae complex NOT DETECTED NOT DETECTED Final   Escherichia coli DETECTED (A) NOT DETECTED Final    Comment: CRITICAL RESULT CALLED TO, READ BACK BY AND VERIFIED WITH: PHARMD D. POINDEXTER 01/11/22 _1  BY AB    Klebsiella aerogenes NOT DETECTED NOT DETECTED Final   Klebsiella oxytoca NOT DETECTED NOT DETECTED Final   Klebsiella pneumoniae NOT DETECTED NOT DETECTED Final   Proteus species NOT DETECTED NOT DETECTED Final   Salmonella species NOT DETECTED NOT DETECTED Final   Serratia marcescens NOT DETECTED NOT DETECTED Final   Haemophilus influenzae NOT DETECTED NOT DETECTED Final   Neisseria meningitidis NOT DETECTED NOT DETECTED Final   Pseudomonas aeruginosa NOT DETECTED NOT DETECTED Final   Stenotrophomonas maltophilia NOT DETECTED NOT DETECTED Final   Candida albicans NOT DETECTED NOT DETECTED Final   Candida auris NOT DETECTED NOT DETECTED Final   Candida glabrata NOT DETECTED NOT DETECTED Final   Candida krusei NOT DETECTED NOT DETECTED Final   Candida parapsilosis NOT DETECTED NOT DETECTED Final   Candida tropicalis NOT DETECTED NOT DETECTED Final   Cryptococcus neoformans/gattii NOT DETECTED NOT DETECTED Final   CTX-M ESBL NOT DETECTED NOT DETECTED Final   Carbapenem resistance IMP NOT DETECTED NOT DETECTED Final   Carbapenem resistance KPC NOT DETECTED NOT DETECTED Final   Carbapenem resistance NDM NOT DETECTED NOT DETECTED Final   Carbapenem resist OXA 48 LIKE NOT DETECTED NOT DETECTED Final   Carbapenem resistance VIM NOT  DETECTED NOT DETECTED Final    Comment: Performed at [facility], [address], [place], [place] [address]  Resp Panel by RT-PCR (Flu A&B, Covid)     Status: None   Collection Time: 01/10/22  1:40 PM   Specimen: Nasal Swab  Result Value Ref Range Status   SARS Coronavirus 2 by RT PCR NEGATIVE NEGATIVE Final    Comment: (NOTE) SARS-CoV-2 target nucleic acids are NOT DETECTED.  The SARS-CoV-2 RNA is generally detectable in upper respiratory specimens during the acute phase of infection. The lowest concentration of SARS-CoV-2 viral copies this assay can detect  is 138 copies/mL. A negative result does not preclude SARS-Cov-2 infection and should not be used as the sole basis for treatment or other patient management decisions. A negative result may occur with  improper specimen collection/handling, submission of specimen other than nasopharyngeal swab, presence of viral mutation(s) within the areas targeted by this assay, and inadequate number of viral copies(<138 copies/mL). A negative result must be combined with clinical observations, patient history, and epidemiological information. The expected result is Negative.  Fact Sheet for Patients:  EntrepreneurPulse.com.au  Fact Sheet for Healthcare Providers:  IncredibleEmployment.be  This test is no t yet approved or cleared by the [place] FDA and  has been authorized for detection and/or diagnosis of SARS-CoV-2 by FDA under an Emergency Use Authorization (EUA). This EUA will remain  in effect (meaning this test can be used) for the duration of the COVID-19 declaration under Section 564(b)(1) of the Act, 21 U.S.C.section 360bbb-3(b)(1), unless the authorization is terminated  or revoked sooner.       Influenza A by PCR NEGATIVE NEGATIVE Final   Influenza B by PCR NEGATIVE NEGATIVE Final    Comment: (NOTE) The Xpert Xpress SARS-CoV-2/FLU/RSV plus assay is intended as an aid in the  diagnosis of influenza from Nasopharyngeal swab specimens and should not be used as a sole basis for treatment. Nasal washings and aspirates are unacceptable for Xpert Xpress SARS-CoV-2/FLU/RSV testing.  Fact Sheet for Patients: EntrepreneurPulse.com.au  Fact Sheet for Healthcare Providers: IncredibleEmployment.be  This test is not yet approved or cleared by the [place] FDA and has been authorized for detection and/or diagnosis of SARS-CoV-2 by FDA under an Emergency Use Authorization (EUA). This EUA will remain in effect (meaning this test can be used) for the duration of the COVID-19 declaration under Section 564(b)(1) of the Act, 21 U.S.C. section 360bbb-3(b)(1), unless the authorization is terminated or revoked.  Performed at [facility], [place] [address] [place], [place] [address]   Urine Culture     Status: Abnormal   Collection Time: 01/10/22  1:40 PM   Specimen: In/Out Cath Urine  Result Value Ref Range Status   Specimen Description   Final    IN/OUT CATH URINE Performed at [facility] [address] [place], [place] [address]    Special Requests   Final    NONE Performed at [facility], [place] [address] [place], [place] [address]    Culture 3,000 COLONIES/mL ENTEROCOCCUS FAECALIS (A)  Final   Report Status 01/13/2022 FINAL  Final   Organism ID, Bacteria ENTEROCOCCUS FAECALIS (A)  Final      Susceptibility   Enterococcus faecalis - MIC*    AMPICILLIN <=2 SENSITIVE Sensitive     NITROFURANTOIN <=16 SENSITIVE Sensitive     VANCOMYCIN 1 SENSITIVE Sensitive     * 3,000 COLONIES/mL ENTEROCOCCUS FAECALIS  MRSA Next Gen by PCR, Nasal     Status: None   Collection Time: 01/10/22  6:03 PM   Specimen: Nasal Mucosa; Nasal Swab  Result Value Ref Range Status   MRSA by PCR Next Gen NOT DETECTED NOT DETECTED Final    Comment: (NOTE) The GeneXpert MRSA Assay (FDA approved for  NASAL specimens only), is one component of a comprehensive MRSA colonization surveillance program. It is not intended to diagnose MRSA infection nor to guide or monitor treatment for MRSA infections. Test performance is not FDA approved in patients less than 51 years old. Performed at [facility], [address] [place],  [place] [address]   Culture, blood (Routine X 2) w Reflex to ID Panel     Status: None   Collection Time: 01/13/22  6:37 AM   Specimen: BLOOD RIGHT ARM  Result Value Ref Range Status   Specimen Description   Final    BLOOD RIGHT ARM Performed at [facility] [address] [place], [place] [address]    Special Requests   Final    BOTTLES DRAWN AEROBIC AND ANAEROBIC Blood Culture adequate volume Performed at [facility] [address] [place], [place] [address]    Culture   Final    NO GROWTH 5 DAYS Performed at [facility], [place] [address] [place], [place] [address]    Report Status 01/18/2022 FINAL  Final  Culture, blood (Routine X 2) w Reflex to ID Panel     Status: None   Collection Time: 01/13/22  6:46 AM   Specimen: BLOOD  Result Value Ref Range Status   Specimen Description   Final    BLOOD BLOOD LEFT HAND Performed at [facility] [address], [place], [place] [address]    Special Requests   Final    BOTTLES DRAWN AEROBIC AND ANAEROBIC Blood Culture results may not be optimal due to an inadequate volume of blood received in culture bottles Performed at [facility] [address], [place], [place] [address]    Culture   Final    NO GROWTH 5 DAYS Performed at [facility], [place] [address], [place], [place] [address]    Report Status 01/18/2022 FINAL  Final    Labs: CBC: Recent Labs  Lab 01/17/22 0634 01/18/22 0538 01/19/22 1520 01/20/22 0603 01/21/22 0544  WBC 5.4 5.1 6.9 8.2 5.4  NEUTROABS 2.8 2.5 6.0 6.7 2.9  HGB 11.5*  12.2* 11.4* 12.7* 11.4*  HCT 36.1* 37.5* 36.2* 39.9 36.1*  MCV 98.9 99.7 101.7* 100.8* 100.6*  PLT 200 227 240 304 500   Basic Metabolic Panel: Recent Labs  Lab 01/17/22 0634 01/18/22 0538 01/19/22 0525 01/20/22 0603 01/21/22 0544  NA 141 141 140 141 139  K 3.1* 3.4* 3.6 4.3 3.4*  CL 106 105 110 108 106  CO2 _0 GLUCOSE 113* 105* 102* 136* 94  BUN _1 CREATININE 1.01 0.96 0.97 0.97 0.90  CALCIUM 8.2* 8.4* 8.2* 8.8* 8.1*  MG  --   --   --  2.1  --    Liver Function Tests: Recent Labs  Lab 01/17/22 0634 01/18/22 0538 01/19/22 0525 01/20/22 0603 01/21/22 0544  AST 77* 80* 68* 82* 66*  ALT 142* 149* 133* 154* 131*  ALKPHOS 74 72 64 74 61  BILITOT 1.0 1.0 0.9 1.3* 0.8  PROT 5.8* 6.1* 5.7* 6.7 5.8*  ALBUMIN 2.8* 2.7* 3.0* 3.3* 2.8*   CBG: No results for input(s): "GLUCAP" in the last 168 hours.  Discharge time spent: greater than 30 minutes.  Signed: Vernelle Emerald, MD [facility] 01/22/2022

## 2022-01-22 NOTE — Progress Notes (Signed)
Mobility Specialist - Progress Note   01/22/22 0917  Mobility  Activity Ambulated with assistance in hallway  Level of Assistance Standby assist, set-up cues, supervision of patient - no hands on  Assistive Device Front wheel walker  Distance Ambulated (ft) 250 ft  Activity Response Tolerated well  Mobility Referral Yes  $Mobility charge 1 Mobility   Pt received in bed and agreeable to mobility even though in lots of pain. Suggested ambulating prior to receiving pain meds, but pt just wanted to get it over with. C/o of  abdomen pain & lower back pain during session. Nurse notified. Pt required assistance with legs when getting back into bed. Pt to bed after session with all needs met.   [facility]

## 2022-01-22 NOTE — Progress Notes (Signed)
Reviewed discharge paperwork with patient and wife. Went over medication regimen & discharge instructions. Advised prescriptions were sent to [facility] on [address]. Patient escorted to main entrance in wheelchair by NT.

## 2022-01-22 NOTE — TOC Transition Note (Signed)
Transition of Care [facility]) - CM/SW Discharge Note   Patient Details  Name: [name] [name] MRN: [id number] Date of Birth: [birth year]  [facility] CM/SW Contact:  Henrietta Dine, RN Phone Number: 01/22/2022, 2:18 PM   Clinical Narrative:    Pt has orders for HHPT and home with hospice; the pt says does not want HHPT; the pt verifies his address [name]; he was previously seen by [facility] hospice; spoke with Danise Mina at [facility]; they can resume his care and will deliver DME; pt's sister [name] notified; no TOC needs.        Patient Goals and CMS Choice        Discharge Placement                       Discharge Plan and Services                                     Social Determinants of Health (SDOH) Interventions     Readmission Risk Interventions    02/12/2021   12:15 PM  Readmission Risk Prevention Plan  Transportation Screening Complete  PCP or Specialist Appt within 3-5 Days Complete

## 2022-01-23 DIAGNOSIS — J42 Unspecified chronic bronchitis: Secondary | ICD-10-CM | POA: Diagnosis not present

## 2022-01-23 DIAGNOSIS — J432 Centrilobular emphysema: Secondary | ICD-10-CM | POA: Diagnosis not present

## 2022-02-01 ENCOUNTER — Encounter: Payer: Self-pay | Admitting: Physician Assistant

## 2022-02-01 ENCOUNTER — Encounter: Payer: Self-pay | Admitting: Internal Medicine

## 2022-02-01 DIAGNOSIS — Z09 Encounter for follow-up examination after completed treatment for conditions other than malignant neoplasm: Secondary | ICD-10-CM | POA: Diagnosis not present

## 2022-02-01 DIAGNOSIS — C799 Secondary malignant neoplasm of unspecified site: Secondary | ICD-10-CM | POA: Diagnosis not present

## 2022-02-01 DIAGNOSIS — Z8619 Personal history of other infectious and parasitic diseases: Secondary | ICD-10-CM | POA: Diagnosis not present

## 2022-02-01 DIAGNOSIS — C3490 Malignant neoplasm of unspecified part of unspecified bronchus or lung: Secondary | ICD-10-CM | POA: Diagnosis not present

## 2022-02-01 DIAGNOSIS — I10 Essential (primary) hypertension: Secondary | ICD-10-CM | POA: Diagnosis not present

## 2022-02-01 DIAGNOSIS — F419 Anxiety disorder, unspecified: Secondary | ICD-10-CM | POA: Diagnosis not present

## 2022-02-03 DIAGNOSIS — J449 Chronic obstructive pulmonary disease, unspecified: Secondary | ICD-10-CM | POA: Diagnosis not present

## 2022-02-03 DIAGNOSIS — C3491 Malignant neoplasm of unspecified part of right bronchus or lung: Secondary | ICD-10-CM | POA: Diagnosis not present

## 2022-02-03 DIAGNOSIS — I2510 Atherosclerotic heart disease of native coronary artery without angina pectoris: Secondary | ICD-10-CM | POA: Diagnosis not present

## 2022-02-03 DIAGNOSIS — C7970 Secondary malignant neoplasm of unspecified adrenal gland: Secondary | ICD-10-CM | POA: Diagnosis not present

## 2022-02-07 ENCOUNTER — Encounter: Payer: Self-pay | Admitting: Internal Medicine

## 2022-02-07 ENCOUNTER — Encounter: Payer: Self-pay | Admitting: Physician Assistant

## 2022-02-10 DIAGNOSIS — M150 Primary generalized (osteo)arthritis: Secondary | ICD-10-CM | POA: Diagnosis not present

## 2022-02-10 DIAGNOSIS — G894 Chronic pain syndrome: Secondary | ICD-10-CM | POA: Diagnosis not present

## 2022-02-10 DIAGNOSIS — Z79891 Long term (current) use of opiate analgesic: Secondary | ICD-10-CM | POA: Diagnosis not present

## 2022-02-10 DIAGNOSIS — G893 Neoplasm related pain (acute) (chronic): Secondary | ICD-10-CM | POA: Diagnosis not present

## 2022-02-14 ENCOUNTER — Encounter: Payer: Self-pay | Admitting: Physician Assistant

## 2022-02-14 ENCOUNTER — Encounter: Payer: Self-pay | Admitting: Internal Medicine

## 2022-02-15 ENCOUNTER — Other Ambulatory Visit: Payer: Self-pay | Admitting: Internal Medicine

## 2022-02-17 ENCOUNTER — Other Ambulatory Visit: Payer: Self-pay

## 2022-02-17 ENCOUNTER — Encounter: Payer: Self-pay | Admitting: Physician Assistant

## 2022-02-17 ENCOUNTER — Ambulatory Visit (HOSPITAL_COMMUNITY)
Admission: RE | Admit: 2022-02-17 | Discharge: 2022-02-17 | Disposition: A | Discharge status: Home / Self Care | Payer: PPO | Source: Ambulatory Visit | Attending: Internal Medicine | Admitting: Internal Medicine

## 2022-02-17 ENCOUNTER — Inpatient Hospital Stay: Payer: PPO | Attending: Internal Medicine

## 2022-02-17 ENCOUNTER — Other Ambulatory Visit (HOSPITAL_COMMUNITY): Payer: PPO

## 2022-02-17 DIAGNOSIS — Z79899 Other long term (current) drug therapy: Secondary | ICD-10-CM | POA: Insufficient documentation

## 2022-02-17 DIAGNOSIS — Z9221 Personal history of antineoplastic chemotherapy: Secondary | ICD-10-CM | POA: Insufficient documentation

## 2022-02-17 DIAGNOSIS — C3490 Malignant neoplasm of unspecified part of unspecified bronchus or lung: Secondary | ICD-10-CM

## 2022-02-17 DIAGNOSIS — C3491 Malignant neoplasm of unspecified part of right bronchus or lung: Secondary | ICD-10-CM

## 2022-02-17 DIAGNOSIS — C7972 Secondary malignant neoplasm of left adrenal gland: Secondary | ICD-10-CM | POA: Insufficient documentation

## 2022-02-17 DIAGNOSIS — R911 Solitary pulmonary nodule: Secondary | ICD-10-CM | POA: Diagnosis not present

## 2022-02-17 DIAGNOSIS — E032 Hypothyroidism due to medicaments and other exogenous substances: Secondary | ICD-10-CM | POA: Diagnosis not present

## 2022-02-17 DIAGNOSIS — N2889 Other specified disorders of kidney and ureter: Secondary | ICD-10-CM | POA: Diagnosis not present

## 2022-02-17 DIAGNOSIS — C3431 Malignant neoplasm of lower lobe, right bronchus or lung: Secondary | ICD-10-CM | POA: Insufficient documentation

## 2022-02-17 LAB — CBC WITH DIFFERENTIAL (CANCER CENTER ONLY)
Abs Immature Granulocytes: 0.01 10*3/uL (ref 0.00–0.07)
Basophils Absolute: 0 10*3/uL (ref 0.0–0.1)
Basophils Relative: 0 %
Eosinophils Absolute: 0.5 10*3/uL (ref 0.0–0.5)
Eosinophils Relative: 8 %
HCT: 40.6 % (ref 39.0–52.0)
Hemoglobin: 13.4 g/dL (ref 13.0–17.0)
Immature Granulocytes: 0 %
Lymphocytes Relative: 24 %
Lymphs Abs: 1.6 10*3/uL (ref 0.7–4.0)
MCH: 32.2 pg (ref 26.0–34.0)
MCHC: 33 g/dL (ref 30.0–36.0)
MCV: 97.6 fL (ref 80.0–100.0)
Monocytes Absolute: 0.7 10*3/uL (ref 0.1–1.0)
Monocytes Relative: 11 %
Neutro Abs: 3.6 10*3/uL (ref 1.7–7.7)
Neutrophils Relative %: 57 %
Platelet Count: 185 10*3/uL (ref 150–400)
RBC: 4.16 MIL/uL — ABNORMAL LOW (ref 4.22–5.81)
RDW: 13.2 % (ref 11.5–15.5)
WBC Count: 6.4 10*3/uL (ref 4.0–10.5)
nRBC: 0 % (ref 0.0–0.2)

## 2022-02-17 LAB — CMP (CANCER CENTER ONLY)
ALT: 19 U/L (ref 0–44)
AST: 19 U/L (ref 15–41)
Albumin: 4.1 g/dL (ref 3.5–5.0)
Alkaline Phosphatase: 72 U/L (ref 38–126)
Anion gap: 4 — ABNORMAL LOW (ref 5–15)
BUN: 13 mg/dL (ref 8–23)
CO2: 31 mmol/L (ref 22–32)
Calcium: 9.8 mg/dL (ref 8.9–10.3)
Chloride: 104 mmol/L (ref 98–111)
Creatinine: 1.17 mg/dL (ref 0.61–1.24)
GFR, Estimated: >60 mL/min (ref >60)
Glucose, Bld: 124 mg/dL — ABNORMAL HIGH (ref 70–99)
Potassium: 4.8 mmol/L (ref 3.5–5.1)
Sodium: 139 mmol/L (ref 135–145)
Total Bilirubin: 0.8 mg/dL (ref 0.3–1.2)
Total Protein: 6.8 g/dL (ref 6.5–8.1)

## 2022-02-17 LAB — TSH: TSH: 1.13 u[IU]/mL (ref 0.350–4.500)

## 2022-02-17 NOTE — Telephone Encounter (Signed)
Opened in error

## 2022-02-21 ENCOUNTER — Other Ambulatory Visit: Payer: Self-pay

## 2022-02-21 ENCOUNTER — Inpatient Hospital Stay (HOSPITAL_BASED_OUTPATIENT_CLINIC_OR_DEPARTMENT_OTHER): Payer: PPO | Admitting: Internal Medicine

## 2022-02-21 VITALS — BP 118/67 | HR 73 | Temp 97.3°F | Resp 16 | Wt 286.1 lb

## 2022-02-21 DIAGNOSIS — C3490 Malignant neoplasm of unspecified part of unspecified bronchus or lung: Secondary | ICD-10-CM

## 2022-02-21 DIAGNOSIS — J42 Unspecified chronic bronchitis: Secondary | ICD-10-CM | POA: Diagnosis not present

## 2022-02-21 DIAGNOSIS — Z79899 Other long term (current) drug therapy: Secondary | ICD-10-CM | POA: Diagnosis not present

## 2022-02-21 DIAGNOSIS — C3431 Malignant neoplasm of lower lobe, right bronchus or lung: Secondary | ICD-10-CM | POA: Diagnosis present

## 2022-02-21 DIAGNOSIS — J432 Centrilobular emphysema: Secondary | ICD-10-CM | POA: Diagnosis not present

## 2022-02-21 DIAGNOSIS — Z9221 Personal history of antineoplastic chemotherapy: Secondary | ICD-10-CM | POA: Diagnosis not present

## 2022-02-21 DIAGNOSIS — C7972 Secondary malignant neoplasm of left adrenal gland: Secondary | ICD-10-CM | POA: Diagnosis present
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
## 2022-02-21 NOTE — Addendum Note (Signed)
Addended by: Ardeen Garland on: 02/21/2022 10:40 AM   Modules accepted: Orders

## 2022-02-22 ENCOUNTER — Encounter: Payer: Self-pay | Admitting: Physician Assistant

## 2022-02-22 DIAGNOSIS — J42 Unspecified chronic bronchitis: Secondary | ICD-10-CM | POA: Diagnosis not present

## 2022-02-22 DIAGNOSIS — J432 Centrilobular emphysema: Secondary | ICD-10-CM | POA: Diagnosis not present

## 2022-02-23 ENCOUNTER — Encounter: Payer: Self-pay | Admitting: Physician Assistant

## 2022-03-24 DIAGNOSIS — J432 Centrilobular emphysema: Secondary | ICD-10-CM | POA: Diagnosis not present

## 2022-03-24 DIAGNOSIS — J42 Unspecified chronic bronchitis: Secondary | ICD-10-CM | POA: Diagnosis not present

## 2022-03-25 DIAGNOSIS — J432 Centrilobular emphysema: Secondary | ICD-10-CM | POA: Diagnosis not present

## 2022-03-25 DIAGNOSIS — J42 Unspecified chronic bronchitis: Secondary | ICD-10-CM | POA: Diagnosis not present

## 2022-04-24 DIAGNOSIS — J432 Centrilobular emphysema: Secondary | ICD-10-CM | POA: Diagnosis not present

## 2022-04-24 DIAGNOSIS — J42 Unspecified chronic bronchitis: Secondary | ICD-10-CM | POA: Diagnosis not present

## 2022-04-25 DIAGNOSIS — J42 Unspecified chronic bronchitis: Secondary | ICD-10-CM | POA: Diagnosis not present

## 2022-04-25 DIAGNOSIS — J432 Centrilobular emphysema: Secondary | ICD-10-CM | POA: Diagnosis not present

## 2022-04-26 ENCOUNTER — Telehealth: Payer: Self-pay | Admitting: *Deleted

## 2022-04-26 NOTE — Patient Outreach (Signed)
  Care Coordination   04/26/2022 Name: [name] [name] MRN: [id number] DOB: 1955/10/05   Care Coordination Outreach Attempts:  An unsuccessful telephone outreach was attempted today to offer the patient information about available care coordination services as a benefit of their health plan.   Follow Up Plan:  Additional outreach attempts will be made to offer the patient care coordination information and services.   Encounter Outcome:  No Answer   Care Coordination Interventions:  No, not indicated    Eduard Clos, MSW, Emma Worker [facility] [phone]

## 2022-05-23 DIAGNOSIS — J432 Centrilobular emphysema: Secondary | ICD-10-CM | POA: Diagnosis not present

## 2022-05-23 DIAGNOSIS — J42 Unspecified chronic bronchitis: Secondary | ICD-10-CM | POA: Diagnosis not present

## 2022-05-24 DIAGNOSIS — J42 Unspecified chronic bronchitis: Secondary | ICD-10-CM | POA: Diagnosis not present

## 2022-05-24 DIAGNOSIS — J432 Centrilobular emphysema: Secondary | ICD-10-CM | POA: Diagnosis not present

## 2022-06-20 ENCOUNTER — Other Ambulatory Visit: Payer: Self-pay

## 2022-06-20 ENCOUNTER — Ambulatory Visit (HOSPITAL_COMMUNITY)
Admission: RE | Admit: 2022-06-20 | Discharge: 2022-06-20 | Disposition: A | Discharge status: Home / Self Care | Payer: Medicare Other | Source: Ambulatory Visit | Attending: Internal Medicine | Admitting: Internal Medicine

## 2022-06-20 ENCOUNTER — Inpatient Hospital Stay: Payer: PPO | Attending: Internal Medicine

## 2022-06-20 DIAGNOSIS — J439 Emphysema, unspecified: Secondary | ICD-10-CM | POA: Diagnosis not present

## 2022-06-20 DIAGNOSIS — N289 Disorder of kidney and ureter, unspecified: Secondary | ICD-10-CM | POA: Diagnosis not present

## 2022-06-20 DIAGNOSIS — C3490 Malignant neoplasm of unspecified part of unspecified bronchus or lung: Secondary | ICD-10-CM

## 2022-06-20 DIAGNOSIS — C7972 Secondary malignant neoplasm of left adrenal gland: Secondary | ICD-10-CM | POA: Insufficient documentation

## 2022-06-20 DIAGNOSIS — R21 Rash and other nonspecific skin eruption: Secondary | ICD-10-CM | POA: Insufficient documentation

## 2022-06-20 DIAGNOSIS — C3431 Malignant neoplasm of lower lobe, right bronchus or lung: Secondary | ICD-10-CM | POA: Insufficient documentation

## 2022-06-20 LAB — CMP (CANCER CENTER ONLY)
ALT: 20 U/L (ref 0–44)
AST: 23 U/L (ref 15–41)
Albumin: 4 g/dL (ref 3.5–5.0)
Alkaline Phosphatase: 64 U/L (ref 38–126)
Anion gap: 7 (ref 5–15)
BUN: 13 mg/dL (ref 8–23)
CO2: 31 mmol/L (ref 22–32)
Calcium: 9.6 mg/dL (ref 8.9–10.3)
Chloride: 102 mmol/L (ref 98–111)
Creatinine: 1.33 mg/dL — ABNORMAL HIGH (ref 0.61–1.24)
GFR, Estimated: 59 mL/min — ABNORMAL LOW (ref >60)
Glucose, Bld: 142 mg/dL — ABNORMAL HIGH (ref 70–99)
Potassium: 3.7 mmol/L (ref 3.5–5.1)
Sodium: 140 mmol/L (ref 135–145)
Total Bilirubin: 0.7 mg/dL (ref 0.3–1.2)
Total Protein: 7 g/dL (ref 6.5–8.1)

## 2022-06-20 LAB — CBC WITH DIFFERENTIAL (CANCER CENTER ONLY)
Abs Immature Granulocytes: 0.02 10*3/uL (ref 0.00–0.07)
Basophils Absolute: 0 10*3/uL (ref 0.0–0.1)
Basophils Relative: 0 %
Eosinophils Absolute: 0.5 10*3/uL (ref 0.0–0.5)
Eosinophils Relative: 7 %
HCT: 41.9 % (ref 39.0–52.0)
Hemoglobin: 14 g/dL (ref 13.0–17.0)
Immature Granulocytes: 0 %
Lymphocytes Relative: 20 %
Lymphs Abs: 1.4 10*3/uL (ref 0.7–4.0)
MCH: 31.4 pg (ref 26.0–34.0)
MCHC: 33.4 g/dL (ref 30.0–36.0)
MCV: 93.9 fL (ref 80.0–100.0)
Monocytes Absolute: 0.6 10*3/uL (ref 0.1–1.0)
Monocytes Relative: 8 %
Neutro Abs: 4.6 10*3/uL (ref 1.7–7.7)
Neutrophils Relative %: 65 %
Platelet Count: 189 10*3/uL (ref 150–400)
RBC: 4.46 MIL/uL (ref 4.22–5.81)
RDW: 13.2 % (ref 11.5–15.5)
WBC Count: 7 10*3/uL (ref 4.0–10.5)
nRBC: 0 % (ref 0.0–0.2)

## 2022-06-22 ENCOUNTER — Inpatient Hospital Stay (HOSPITAL_BASED_OUTPATIENT_CLINIC_OR_DEPARTMENT_OTHER): Payer: PPO | Admitting: Internal Medicine

## 2022-06-22 VITALS — BP 97/60 | HR 93 | Temp 98.1°F | Resp 17 | Wt 274.5 lb

## 2022-06-22 DIAGNOSIS — R21 Rash and other nonspecific skin eruption: Secondary | ICD-10-CM | POA: Diagnosis not present

## 2022-06-22 DIAGNOSIS — C3490 Malignant neoplasm of unspecified part of unspecified bronchus or lung: Secondary | ICD-10-CM

## 2022-06-22 DIAGNOSIS — C7972 Secondary malignant neoplasm of left adrenal gland: Secondary | ICD-10-CM | POA: Diagnosis not present

## 2022-06-22 DIAGNOSIS — C3431 Malignant neoplasm of lower lobe, right bronchus or lung: Secondary | ICD-10-CM | POA: Diagnosis not present
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
## 2022-06-23 ENCOUNTER — Ambulatory Visit: Payer: PPO | Admitting: Internal Medicine

## 2022-06-23 DIAGNOSIS — J432 Centrilobular emphysema: Secondary | ICD-10-CM | POA: Diagnosis not present

## 2022-06-23 DIAGNOSIS — J42 Unspecified chronic bronchitis: Secondary | ICD-10-CM | POA: Diagnosis not present

## 2022-06-24 DIAGNOSIS — J42 Unspecified chronic bronchitis: Secondary | ICD-10-CM | POA: Diagnosis not present

## 2022-06-24 DIAGNOSIS — J432 Centrilobular emphysema: Secondary | ICD-10-CM | POA: Diagnosis not present

## 2022-07-23 DIAGNOSIS — J432 Centrilobular emphysema: Secondary | ICD-10-CM | POA: Diagnosis not present

## 2022-07-23 DIAGNOSIS — J42 Unspecified chronic bronchitis: Secondary | ICD-10-CM | POA: Diagnosis not present

## 2022-07-24 DIAGNOSIS — J432 Centrilobular emphysema: Secondary | ICD-10-CM | POA: Diagnosis not present

## 2022-07-24 DIAGNOSIS — J42 Unspecified chronic bronchitis: Secondary | ICD-10-CM | POA: Diagnosis not present

## 2022-07-28 DIAGNOSIS — R7303 Prediabetes: Secondary | ICD-10-CM | POA: Diagnosis not present

## 2022-07-28 DIAGNOSIS — I10 Essential (primary) hypertension: Secondary | ICD-10-CM | POA: Diagnosis not present

## 2022-07-28 DIAGNOSIS — I2510 Atherosclerotic heart disease of native coronary artery without angina pectoris: Secondary | ICD-10-CM | POA: Diagnosis not present

## 2022-07-28 DIAGNOSIS — C3490 Malignant neoplasm of unspecified part of unspecified bronchus or lung: Secondary | ICD-10-CM | POA: Diagnosis not present

## 2022-07-28 DIAGNOSIS — C799 Secondary malignant neoplasm of unspecified site: Secondary | ICD-10-CM | POA: Diagnosis not present

## 2022-07-28 DIAGNOSIS — R600 Localized edema: Secondary | ICD-10-CM | POA: Diagnosis not present

## 2022-07-28 DIAGNOSIS — J449 Chronic obstructive pulmonary disease, unspecified: Secondary | ICD-10-CM | POA: Diagnosis not present

## 2022-07-28 DIAGNOSIS — Z0000 Encounter for general adult medical examination without abnormal findings: Secondary | ICD-10-CM | POA: Diagnosis not present

## 2022-07-28 DIAGNOSIS — E7800 Pure hypercholesterolemia, unspecified: Secondary | ICD-10-CM | POA: Diagnosis not present

## 2022-08-23 DIAGNOSIS — J432 Centrilobular emphysema: Secondary | ICD-10-CM | POA: Diagnosis not present

## 2022-08-23 DIAGNOSIS — J42 Unspecified chronic bronchitis: Secondary | ICD-10-CM | POA: Diagnosis not present

## 2022-08-24 DIAGNOSIS — J432 Centrilobular emphysema: Secondary | ICD-10-CM | POA: Diagnosis not present

## 2022-08-24 DIAGNOSIS — J42 Unspecified chronic bronchitis: Secondary | ICD-10-CM | POA: Diagnosis not present

## 2022-09-22 DIAGNOSIS — J42 Unspecified chronic bronchitis: Secondary | ICD-10-CM | POA: Diagnosis not present

## 2022-09-22 DIAGNOSIS — J432 Centrilobular emphysema: Secondary | ICD-10-CM | POA: Diagnosis not present

## 2022-09-23 DIAGNOSIS — J42 Unspecified chronic bronchitis: Secondary | ICD-10-CM | POA: Diagnosis not present

## 2022-09-23 DIAGNOSIS — J432 Centrilobular emphysema: Secondary | ICD-10-CM | POA: Diagnosis not present

## 2022-10-23 DIAGNOSIS — J42 Unspecified chronic bronchitis: Secondary | ICD-10-CM | POA: Diagnosis not present

## 2022-10-23 DIAGNOSIS — J432 Centrilobular emphysema: Secondary | ICD-10-CM | POA: Diagnosis not present

## 2022-10-24 DIAGNOSIS — J432 Centrilobular emphysema: Secondary | ICD-10-CM | POA: Diagnosis not present

## 2022-10-24 DIAGNOSIS — J42 Unspecified chronic bronchitis: Secondary | ICD-10-CM | POA: Diagnosis not present

## 2022-11-09 ENCOUNTER — Other Ambulatory Visit: Payer: Self-pay | Admitting: Physician Assistant

## 2022-11-23 DIAGNOSIS — J432 Centrilobular emphysema: Secondary | ICD-10-CM | POA: Diagnosis not present

## 2022-11-23 DIAGNOSIS — J42 Unspecified chronic bronchitis: Secondary | ICD-10-CM | POA: Diagnosis not present

## 2022-12-19 ENCOUNTER — Encounter: Payer: Self-pay | Admitting: Physician Assistant

## 2022-12-20 ENCOUNTER — Other Ambulatory Visit: Payer: Self-pay | Admitting: Internal Medicine

## 2022-12-20 ENCOUNTER — Ambulatory Visit (HOSPITAL_COMMUNITY)
Admission: RE | Admit: 2022-12-20 | Discharge: 2022-12-20 | Disposition: A | Discharge status: Home / Self Care | Payer: Medicare Other | Source: Ambulatory Visit | Attending: Internal Medicine | Admitting: Internal Medicine

## 2022-12-20 DIAGNOSIS — C3490 Malignant neoplasm of unspecified part of unspecified bronchus or lung: Secondary | ICD-10-CM | POA: Diagnosis present

## 2022-12-21 ENCOUNTER — Encounter: Payer: Self-pay | Admitting: Physician Assistant

## 2022-12-21 NOTE — Telephone Encounter (Signed)
TC

## 2022-12-22 ENCOUNTER — Inpatient Hospital Stay: Payer: Medicare Other | Attending: Internal Medicine

## 2022-12-22 DIAGNOSIS — L409 Psoriasis, unspecified: Secondary | ICD-10-CM | POA: Diagnosis not present

## 2022-12-22 DIAGNOSIS — Z85118 Personal history of other malignant neoplasm of bronchus and lung: Secondary | ICD-10-CM | POA: Insufficient documentation

## 2022-12-22 DIAGNOSIS — C3490 Malignant neoplasm of unspecified part of unspecified bronchus or lung: Secondary | ICD-10-CM

## 2022-12-22 DIAGNOSIS — E876 Hypokalemia: Secondary | ICD-10-CM | POA: Insufficient documentation

## 2022-12-22 DIAGNOSIS — Z08 Encounter for follow-up examination after completed treatment for malignant neoplasm: Secondary | ICD-10-CM | POA: Insufficient documentation

## 2022-12-22 LAB — CBC WITH DIFFERENTIAL (CANCER CENTER ONLY)
Abs Immature Granulocytes: 0.02 10*3/uL (ref 0.00–0.07)
Basophils Absolute: 0 10*3/uL (ref 0.0–0.1)
Basophils Relative: 0 %
Eosinophils Absolute: 0.4 10*3/uL (ref 0.0–0.5)
Eosinophils Relative: 5 %
HCT: 40.6 % (ref 39.0–52.0)
Hemoglobin: 13.4 g/dL (ref 13.0–17.0)
Immature Granulocytes: 0 %
Lymphocytes Relative: 18 %
Lymphs Abs: 1.4 10*3/uL (ref 0.7–4.0)
MCH: 30.5 pg (ref 26.0–34.0)
MCHC: 33 g/dL (ref 30.0–36.0)
MCV: 92.5 fL (ref 80.0–100.0)
Monocytes Absolute: 0.5 10*3/uL (ref 0.1–1.0)
Monocytes Relative: 7 %
Neutro Abs: 5.2 10*3/uL (ref 1.7–7.7)
Neutrophils Relative %: 70 %
Platelet Count: 226 10*3/uL (ref 150–400)
RBC: 4.39 MIL/uL (ref 4.22–5.81)
RDW: 12.5 % (ref 11.5–15.5)
WBC Count: 7.4 10*3/uL (ref 4.0–10.5)
nRBC: 0 % (ref 0.0–0.2)

## 2022-12-22 LAB — CMP (CANCER CENTER ONLY)
ALT: 9 U/L (ref 0–44)
AST: 13 U/L — ABNORMAL LOW (ref 15–41)
Albumin: 3.9 g/dL (ref 3.5–5.0)
Alkaline Phosphatase: 69 U/L (ref 38–126)
Anion gap: 7 (ref 5–15)
BUN: 15 mg/dL (ref 8–23)
CO2: 30 mmol/L (ref 22–32)
Calcium: 9.3 mg/dL (ref 8.9–10.3)
Chloride: 105 mmol/L (ref 98–111)
Creatinine: 1.06 mg/dL (ref 0.61–1.24)
GFR, Estimated: >60 mL/min (ref >60)
Glucose, Bld: 123 mg/dL — ABNORMAL HIGH (ref 70–99)
Potassium: 3.3 mmol/L — ABNORMAL LOW (ref 3.5–5.1)
Sodium: 142 mmol/L (ref 135–145)
Total Bilirubin: 0.7 mg/dL (ref 0.3–1.2)
Total Protein: 6.8 g/dL (ref 6.5–8.1)

## 2022-12-26 ENCOUNTER — Inpatient Hospital Stay: Payer: Medicare Other | Admitting: Internal Medicine

## 2022-12-26 VITALS — BP 185/163 | HR 83 | Temp 98.4°F | Resp 17 | Ht 69.0 in | Wt 270.9 lb

## 2022-12-26 DIAGNOSIS — C3490 Malignant neoplasm of unspecified part of unspecified bronchus or lung: Secondary | ICD-10-CM | POA: Diagnosis not present

## 2022-12-26 DIAGNOSIS — Z08 Encounter for follow-up examination after completed treatment for malignant neoplasm: Secondary | ICD-10-CM | POA: Diagnosis not present
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
## 2023-01-10 ENCOUNTER — Encounter: Payer: Self-pay | Admitting: Physician Assistant

## 2023-01-11 ENCOUNTER — Encounter: Payer: Self-pay | Admitting: Physician Assistant

## 2023-01-31 ENCOUNTER — Encounter: Payer: Self-pay | Admitting: Physician Assistant

## 2023-02-06 ENCOUNTER — Encounter: Payer: Self-pay | Admitting: Physician Assistant

## 2023-02-10 ENCOUNTER — Encounter: Payer: Self-pay | Admitting: Physician Assistant

## 2023-03-09 ENCOUNTER — Encounter: Payer: Self-pay | Admitting: Physician Assistant

## 2023-04-04 ENCOUNTER — Encounter: Payer: Self-pay | Admitting: Physician Assistant

## 2023-04-28 ENCOUNTER — Encounter: Payer: Self-pay | Admitting: Acute Care

## 2023-06-07 ENCOUNTER — Encounter: Payer: Self-pay | Admitting: Physician Assistant

## 2023-06-14 ENCOUNTER — Ambulatory Visit (HOSPITAL_COMMUNITY)
Admission: RE | Admit: 2023-06-14 | Discharge: 2023-06-14 | Disposition: A | Discharge status: Home / Self Care | Source: Ambulatory Visit | Attending: Internal Medicine | Admitting: Internal Medicine

## 2023-06-14 DIAGNOSIS — C3490 Malignant neoplasm of unspecified part of unspecified bronchus or lung: Secondary | ICD-10-CM | POA: Diagnosis present

## 2023-06-16 ENCOUNTER — Other Ambulatory Visit (HOSPITAL_COMMUNITY)
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
## 2023-06-26 ENCOUNTER — Inpatient Hospital Stay: Payer: PPO | Attending: Physician Assistant

## 2023-06-26 ENCOUNTER — Inpatient Hospital Stay: Payer: PPO | Admitting: Physician Assistant

## 2023-06-26 VITALS — BP 116/65 | HR 74 | Temp 97.6°F | Resp 19 | Wt 300.5 lb

## 2023-06-26 DIAGNOSIS — C3491 Malignant neoplasm of unspecified part of right bronchus or lung: Secondary | ICD-10-CM

## 2023-06-26 DIAGNOSIS — C3490 Malignant neoplasm of unspecified part of unspecified bronchus or lung: Secondary | ICD-10-CM

## 2023-06-26 DIAGNOSIS — Z08 Encounter for follow-up examination after completed treatment for malignant neoplasm: Secondary | ICD-10-CM | POA: Diagnosis not present

## 2023-06-26 DIAGNOSIS — Z85118 Personal history of other malignant neoplasm of bronchus and lung: Secondary | ICD-10-CM | POA: Insufficient documentation

## 2023-06-26 LAB — CBC WITH DIFFERENTIAL (CANCER CENTER ONLY)
Abs Immature Granulocytes: 0.01 10*3/uL (ref 0.00–0.07)
Basophils Absolute: 0 10*3/uL (ref 0.0–0.1)
Basophils Relative: 0 %
Eosinophils Absolute: 0.4 10*3/uL (ref 0.0–0.5)
Eosinophils Relative: 7 %
HCT: 43 % (ref 39.0–52.0)
Hemoglobin: 13.9 g/dL (ref 13.0–17.0)
Immature Granulocytes: 0 %
Lymphocytes Relative: 23 %
Lymphs Abs: 1.3 10*3/uL (ref 0.7–4.0)
MCH: 30.3 pg (ref 26.0–34.0)
MCHC: 32.3 g/dL (ref 30.0–36.0)
MCV: 93.9 fL (ref 80.0–100.0)
Monocytes Absolute: 0.5 10*3/uL (ref 0.1–1.0)
Monocytes Relative: 9 %
Neutro Abs: 3.4 10*3/uL (ref 1.7–7.7)
Neutrophils Relative %: 61 %
Platelet Count: 235 10*3/uL (ref 150–400)
RBC: 4.58 MIL/uL (ref 4.22–5.81)
RDW: 13.2 % (ref 11.5–15.5)
WBC Count: 5.7 10*3/uL (ref 4.0–10.5)
nRBC: 0 % (ref 0.0–0.2)

## 2023-06-26 LAB — CMP (CANCER CENTER ONLY)
ALT: 9 U/L (ref 0–44)
AST: 14 U/L — ABNORMAL LOW (ref 15–41)
Albumin: 4.1 g/dL (ref 3.5–5.0)
Alkaline Phosphatase: 73 U/L (ref 38–126)
Anion gap: 5 (ref 5–15)
BUN: 14 mg/dL (ref 8–23)
CO2: 34 mmol/L — ABNORMAL HIGH (ref 22–32)
Calcium: 9 mg/dL (ref 8.9–10.3)
Chloride: 102 mmol/L (ref 98–111)
Creatinine: 1.13 mg/dL (ref 0.61–1.24)
GFR, Estimated: >60 mL/min (ref >60)
Glucose, Bld: 108 mg/dL — ABNORMAL HIGH (ref 70–99)
Potassium: 3.9 mmol/L (ref 3.5–5.1)
Sodium: 141 mmol/L (ref 135–145)
Total Bilirubin: 1 mg/dL (ref 0.0–1.2)
Total Protein: 6.8 g/dL (ref 6.5–8.1)

## 2023-07-12 ENCOUNTER — Encounter: Payer: Self-pay | Admitting: Physician Assistant

## 2023-10-26 DIAGNOSIS — C3490 Malignant neoplasm of unspecified part of unspecified bronchus or lung: Secondary | ICD-10-CM | POA: Diagnosis not present

## 2023-10-26 DIAGNOSIS — I2510 Atherosclerotic heart disease of native coronary artery without angina pectoris: Secondary | ICD-10-CM | POA: Diagnosis not present

## 2023-10-26 DIAGNOSIS — C7970 Secondary malignant neoplasm of unspecified adrenal gland: Secondary | ICD-10-CM | POA: Diagnosis not present

## 2023-10-26 DIAGNOSIS — R7303 Prediabetes: Secondary | ICD-10-CM | POA: Diagnosis not present

## 2023-10-26 DIAGNOSIS — I10 Essential (primary) hypertension: Secondary | ICD-10-CM | POA: Diagnosis not present

## 2023-10-26 DIAGNOSIS — Z0000 Encounter for general adult medical examination without abnormal findings: Secondary | ICD-10-CM | POA: Diagnosis not present

## 2023-10-26 DIAGNOSIS — J449 Chronic obstructive pulmonary disease, unspecified: Secondary | ICD-10-CM | POA: Diagnosis not present

## 2023-10-26 DIAGNOSIS — C799 Secondary malignant neoplasm of unspecified site: Secondary | ICD-10-CM | POA: Diagnosis not present

## 2023-10-26 DIAGNOSIS — R600 Localized edema: Secondary | ICD-10-CM | POA: Diagnosis not present

## 2023-10-26 DIAGNOSIS — Z7409 Other reduced mobility: Secondary | ICD-10-CM | POA: Diagnosis not present

## 2023-10-26 DIAGNOSIS — E7800 Pure hypercholesterolemia, unspecified: Secondary | ICD-10-CM | POA: Diagnosis not present

## 2023-10-27 ENCOUNTER — Encounter: Payer: Self-pay | Admitting: Physician Assistant

## 2023-12-19 ENCOUNTER — Encounter: Payer: Self-pay | Admitting: Physician Assistant

## 2023-12-26 ENCOUNTER — Ambulatory Visit (HOSPITAL_COMMUNITY)
Admission: RE | Admit: 2023-12-26 | Discharge: 2023-12-26 | Disposition: A | Discharge status: Home / Self Care | Source: Ambulatory Visit | Attending: Physician Assistant | Admitting: Physician Assistant

## 2023-12-26 ENCOUNTER — Inpatient Hospital Stay: Attending: Internal Medicine

## 2023-12-26 DIAGNOSIS — C3490 Malignant neoplasm of unspecified part of unspecified bronchus or lung: Secondary | ICD-10-CM | POA: Insufficient documentation

## 2023-12-26 DIAGNOSIS — Z9226 Personal history of immune checkpoint inhibitor therapy: Secondary | ICD-10-CM | POA: Diagnosis not present

## 2023-12-26 DIAGNOSIS — Z85118 Personal history of other malignant neoplasm of bronchus and lung: Secondary | ICD-10-CM | POA: Diagnosis present

## 2023-12-26 DIAGNOSIS — Z08 Encounter for follow-up examination after completed treatment for malignant neoplasm: Secondary | ICD-10-CM | POA: Insufficient documentation

## 2023-12-26 DIAGNOSIS — E278 Other specified disorders of adrenal gland: Secondary | ICD-10-CM | POA: Insufficient documentation

## 2023-12-26 DIAGNOSIS — R160 Hepatomegaly, not elsewhere classified: Secondary | ICD-10-CM | POA: Diagnosis not present

## 2023-12-26 DIAGNOSIS — C3431 Malignant neoplasm of lower lobe, right bronchus or lung: Secondary | ICD-10-CM | POA: Insufficient documentation

## 2023-12-26 DIAGNOSIS — I700 Atherosclerosis of aorta: Secondary | ICD-10-CM | POA: Diagnosis not present

## 2023-12-26 DIAGNOSIS — G8929 Other chronic pain: Secondary | ICD-10-CM | POA: Diagnosis not present

## 2023-12-26 DIAGNOSIS — Z860101 Personal history of adenomatous and serrated colon polyps: Secondary | ICD-10-CM | POA: Insufficient documentation

## 2023-12-26 DIAGNOSIS — J432 Centrilobular emphysema: Secondary | ICD-10-CM | POA: Diagnosis not present

## 2023-12-26 DIAGNOSIS — I2510 Atherosclerotic heart disease of native coronary artery without angina pectoris: Secondary | ICD-10-CM | POA: Insufficient documentation

## 2023-12-26 LAB — CMP (CANCER CENTER ONLY)
ALT: 14 U/L (ref 0–44)
AST: 16 U/L (ref 15–41)
Albumin: 4 g/dL (ref 3.5–5.0)
Alkaline Phosphatase: 65 U/L (ref 38–126)
Anion gap: 6 (ref 5–15)
BUN: 12 mg/dL (ref 8–23)
CO2: 31 mmol/L (ref 22–32)
Calcium: 9.4 mg/dL (ref 8.9–10.3)
Chloride: 103 mmol/L (ref 98–111)
Creatinine: 1.12 mg/dL (ref 0.61–1.24)
GFR, Estimated: >60 mL/min (ref >60)
Glucose, Bld: 115 mg/dL — ABNORMAL HIGH (ref 70–99)
Potassium: 3.8 mmol/L (ref 3.5–5.1)
Sodium: 140 mmol/L (ref 135–145)
Total Bilirubin: 0.9 mg/dL (ref 0.0–1.2)
Total Protein: 7 g/dL (ref 6.5–8.1)

## 2023-12-26 LAB — CBC WITH DIFFERENTIAL (CANCER CENTER ONLY)
Abs Immature Granulocytes: 0.02 K/uL (ref 0.00–0.07)
Basophils Absolute: 0 K/uL (ref 0.0–0.1)
Basophils Relative: 0 %
Eosinophils Absolute: 0.4 K/uL (ref 0.0–0.5)
Eosinophils Relative: 6 %
HCT: 46.6 % (ref 39.0–52.0)
Hemoglobin: 15.5 g/dL (ref 13.0–17.0)
Immature Granulocytes: 0 %
Lymphocytes Relative: 23 %
Lymphs Abs: 1.5 K/uL (ref 0.7–4.0)
MCH: 30.7 pg (ref 26.0–34.0)
MCHC: 33.3 g/dL (ref 30.0–36.0)
MCV: 92.3 fL (ref 80.0–100.0)
Monocytes Absolute: 0.5 K/uL (ref 0.1–1.0)
Monocytes Relative: 7 %
Neutro Abs: 4 K/uL (ref 1.7–7.7)
Neutrophils Relative %: 64 %
Platelet Count: 199 K/uL (ref 150–400)
RBC: 5.05 MIL/uL (ref 4.22–5.81)
RDW: 12 % (ref 11.5–15.5)
WBC Count: 6.3 K/uL (ref 4.0–10.5)
nRBC: 0 % (ref 0.0–0.2)

## 2024-01-04 ENCOUNTER — Inpatient Hospital Stay: Admitting: Internal Medicine

## 2024-01-04 VITALS — HR 84 | Temp 97.3°F | Resp 17 | Ht 69.0 in | Wt 298.0 lb

## 2024-01-04 DIAGNOSIS — C3431 Malignant neoplasm of lower lobe, right bronchus or lung: Secondary | ICD-10-CM | POA: Diagnosis not present

## 2024-01-04 DIAGNOSIS — C3490 Malignant neoplasm of unspecified part of unspecified bronchus or lung: Secondary | ICD-10-CM | POA: Diagnosis not present
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
## 2024-01-08 ENCOUNTER — Telehealth: Payer: Self-pay | Admitting: Internal Medicine

## 2024-01-08 NOTE — Telephone Encounter (Signed)
 Scheduled patient next appointments. Called and left a voicemail with the details.

## 2024-02-07 DIAGNOSIS — F1190 Opioid use, unspecified, uncomplicated: Secondary | ICD-10-CM | POA: Diagnosis not present

## 2024-02-08 ENCOUNTER — Encounter: Payer: Self-pay | Admitting: Physician Assistant

## 2024-06-25 ENCOUNTER — Inpatient Hospital Stay

## 2024-07-04 ENCOUNTER — Inpatient Hospital Stay: Admitting: Internal Medicine
